# Patient Record
Sex: Female | Born: 1985 | Race: White | Hispanic: No | Marital: Single | Smoking: Former smoker
Health system: Southern US, Community
[De-identification: ages and names within clinical notes are randomized; demographics above are authoritative.]

## PROBLEM LIST (undated history)

## (undated) DIAGNOSIS — H9190 Unspecified hearing loss, unspecified ear: Secondary | ICD-10-CM

## (undated) DIAGNOSIS — I10 Essential (primary) hypertension: Secondary | ICD-10-CM

## (undated) DIAGNOSIS — K760 Fatty (change of) liver, not elsewhere classified: Secondary | ICD-10-CM

## (undated) DIAGNOSIS — K219 Gastro-esophageal reflux disease without esophagitis: Secondary | ICD-10-CM

## (undated) DIAGNOSIS — R519 Headache, unspecified: Secondary | ICD-10-CM

## (undated) DIAGNOSIS — R202 Paresthesia of skin: Secondary | ICD-10-CM

## (undated) DIAGNOSIS — IMO0002 Reserved for concepts with insufficient information to code with codable children: Secondary | ICD-10-CM

## (undated) DIAGNOSIS — E538 Deficiency of other specified B group vitamins: Secondary | ICD-10-CM

## (undated) DIAGNOSIS — G510 Bell's palsy: Secondary | ICD-10-CM

## (undated) DIAGNOSIS — R079 Chest pain, unspecified: Secondary | ICD-10-CM

## (undated) DIAGNOSIS — E0590 Thyrotoxicosis, unspecified without thyrotoxic crisis or storm: Secondary | ICD-10-CM

## (undated) DIAGNOSIS — F602 Antisocial personality disorder: Secondary | ICD-10-CM

## (undated) DIAGNOSIS — F909 Attention-deficit hyperactivity disorder, unspecified type: Secondary | ICD-10-CM

## (undated) DIAGNOSIS — E669 Obesity, unspecified: Secondary | ICD-10-CM

## (undated) DIAGNOSIS — R5383 Other fatigue: Secondary | ICD-10-CM

## (undated) DIAGNOSIS — G43709 Chronic migraine without aura, not intractable, without status migrainosus: Secondary | ICD-10-CM

## (undated) DIAGNOSIS — R739 Hyperglycemia, unspecified: Secondary | ICD-10-CM

## (undated) DIAGNOSIS — F329 Major depressive disorder, single episode, unspecified: Secondary | ICD-10-CM

## (undated) DIAGNOSIS — M797 Fibromyalgia: Secondary | ICD-10-CM

## (undated) DIAGNOSIS — J45909 Unspecified asthma, uncomplicated: Secondary | ICD-10-CM

## (undated) DIAGNOSIS — E781 Pure hyperglyceridemia: Secondary | ICD-10-CM

## (undated) DIAGNOSIS — R51 Headache: Secondary | ICD-10-CM

## (undated) DIAGNOSIS — F32A Depression, unspecified: Secondary | ICD-10-CM

## (undated) HISTORY — DX: Unspecified hearing loss, unspecified ear: H91.90

## (undated) HISTORY — DX: Obesity, unspecified: E66.9

## (undated) HISTORY — PX: COCHLEAR IMPLANT: SUR684

## (undated) HISTORY — DX: Depression, unspecified: F32.A

## (undated) HISTORY — PX: MYRINGOTOMY WITH TUBE PLACEMENT: SHX5663

## (undated) HISTORY — DX: Reserved for concepts with insufficient information to code with codable children: IMO0002

## (undated) HISTORY — DX: Fatty (change of) liver, not elsewhere classified: K76.0

## (undated) HISTORY — DX: Unspecified asthma, uncomplicated: J45.909

## (undated) HISTORY — DX: Other fatigue: R53.83

## (undated) HISTORY — DX: Bell's palsy: G51.0

## (undated) HISTORY — DX: Deficiency of other specified B group vitamins: E53.8

## (undated) HISTORY — DX: Attention-deficit hyperactivity disorder, unspecified type: F90.9

## (undated) HISTORY — DX: Headache, unspecified: R51.9

## (undated) HISTORY — DX: Chest pain, unspecified: R07.9

## (undated) HISTORY — DX: Fibromyalgia: M79.7

## (undated) HISTORY — DX: Hyperglycemia, unspecified: R73.9

## (undated) HISTORY — DX: Major depressive disorder, single episode, unspecified: F32.9

## (undated) HISTORY — DX: Headache: R51

## (undated) HISTORY — PX: LAPAROSCOPY: SHX197

## (undated) HISTORY — DX: Pure hyperglyceridemia: E78.1

## (undated) HISTORY — DX: Paresthesia of skin: R20.2

## (undated) HISTORY — DX: Gastro-esophageal reflux disease without esophagitis: K21.9

## (undated) HISTORY — PX: OTHER SURGICAL HISTORY: SHX169

## (undated) HISTORY — PX: COCHLEAR IMPLANT REMOVAL: SHX1365

## (undated) HISTORY — DX: Chronic migraine without aura, not intractable, without status migrainosus: G43.709

---

## 2008-09-16 DIAGNOSIS — N750 Cyst of Bartholin's gland: Secondary | ICD-10-CM | POA: Insufficient documentation

## 2011-12-06 DIAGNOSIS — N949 Unspecified condition associated with female genital organs and menstrual cycle: Secondary | ICD-10-CM | POA: Diagnosis not present

## 2011-12-06 DIAGNOSIS — Z309 Encounter for contraceptive management, unspecified: Secondary | ICD-10-CM | POA: Diagnosis not present

## 2011-12-23 DIAGNOSIS — N949 Unspecified condition associated with female genital organs and menstrual cycle: Secondary | ICD-10-CM | POA: Diagnosis not present

## 2012-02-10 DIAGNOSIS — Z01419 Encounter for gynecological examination (general) (routine) without abnormal findings: Secondary | ICD-10-CM | POA: Diagnosis not present

## 2012-02-10 DIAGNOSIS — R5381 Other malaise: Secondary | ICD-10-CM | POA: Diagnosis not present

## 2012-02-10 DIAGNOSIS — R1084 Generalized abdominal pain: Secondary | ICD-10-CM | POA: Diagnosis not present

## 2012-02-10 DIAGNOSIS — N3090 Cystitis, unspecified without hematuria: Secondary | ICD-10-CM | POA: Diagnosis not present

## 2012-03-19 DIAGNOSIS — IMO0001 Reserved for inherently not codable concepts without codable children: Secondary | ICD-10-CM | POA: Diagnosis not present

## 2012-04-30 DIAGNOSIS — N946 Dysmenorrhea, unspecified: Secondary | ICD-10-CM | POA: Diagnosis not present

## 2012-04-30 DIAGNOSIS — N949 Unspecified condition associated with female genital organs and menstrual cycle: Secondary | ICD-10-CM | POA: Diagnosis not present

## 2012-05-11 DIAGNOSIS — L251 Unspecified contact dermatitis due to drugs in contact with skin: Secondary | ICD-10-CM | POA: Diagnosis not present

## 2012-07-09 DIAGNOSIS — H905 Unspecified sensorineural hearing loss: Secondary | ICD-10-CM | POA: Diagnosis not present

## 2012-07-09 DIAGNOSIS — H913 Deaf nonspeaking, not elsewhere classified: Secondary | ICD-10-CM | POA: Diagnosis not present

## 2012-07-09 DIAGNOSIS — IMO0001 Reserved for inherently not codable concepts without codable children: Secondary | ICD-10-CM | POA: Diagnosis not present

## 2012-07-11 DIAGNOSIS — H903 Sensorineural hearing loss, bilateral: Secondary | ICD-10-CM | POA: Diagnosis not present

## 2012-07-13 DIAGNOSIS — R109 Unspecified abdominal pain: Secondary | ICD-10-CM | POA: Diagnosis not present

## 2012-07-13 DIAGNOSIS — N898 Other specified noninflammatory disorders of vagina: Secondary | ICD-10-CM | POA: Diagnosis not present

## 2012-07-13 DIAGNOSIS — N3010 Interstitial cystitis (chronic) without hematuria: Secondary | ICD-10-CM | POA: Diagnosis not present

## 2012-07-13 DIAGNOSIS — R319 Hematuria, unspecified: Secondary | ICD-10-CM | POA: Diagnosis not present

## 2012-07-19 DIAGNOSIS — N3010 Interstitial cystitis (chronic) without hematuria: Secondary | ICD-10-CM | POA: Diagnosis not present

## 2012-07-19 DIAGNOSIS — N898 Other specified noninflammatory disorders of vagina: Secondary | ICD-10-CM | POA: Diagnosis not present

## 2012-07-19 DIAGNOSIS — R319 Hematuria, unspecified: Secondary | ICD-10-CM | POA: Diagnosis not present

## 2012-07-27 DIAGNOSIS — R635 Abnormal weight gain: Secondary | ICD-10-CM | POA: Diagnosis not present

## 2012-07-27 DIAGNOSIS — Z309 Encounter for contraceptive management, unspecified: Secondary | ICD-10-CM | POA: Diagnosis not present

## 2012-08-27 DIAGNOSIS — R319 Hematuria, unspecified: Secondary | ICD-10-CM | POA: Diagnosis not present

## 2012-08-27 DIAGNOSIS — R109 Unspecified abdominal pain: Secondary | ICD-10-CM | POA: Diagnosis not present

## 2012-08-27 DIAGNOSIS — N3010 Interstitial cystitis (chronic) without hematuria: Secondary | ICD-10-CM | POA: Diagnosis not present

## 2012-08-28 DIAGNOSIS — R319 Hematuria, unspecified: Secondary | ICD-10-CM | POA: Diagnosis not present

## 2012-08-28 DIAGNOSIS — N3010 Interstitial cystitis (chronic) without hematuria: Secondary | ICD-10-CM | POA: Diagnosis not present

## 2012-10-08 DIAGNOSIS — N3010 Interstitial cystitis (chronic) without hematuria: Secondary | ICD-10-CM | POA: Diagnosis not present

## 2012-10-08 DIAGNOSIS — R319 Hematuria, unspecified: Secondary | ICD-10-CM | POA: Diagnosis not present

## 2012-10-08 DIAGNOSIS — R109 Unspecified abdominal pain: Secondary | ICD-10-CM | POA: Diagnosis not present

## 2012-10-18 DIAGNOSIS — Z3009 Encounter for other general counseling and advice on contraception: Secondary | ICD-10-CM | POA: Diagnosis not present

## 2012-11-26 DIAGNOSIS — IMO0002 Reserved for concepts with insufficient information to code with codable children: Secondary | ICD-10-CM | POA: Diagnosis not present

## 2012-12-17 DIAGNOSIS — IMO0002 Reserved for concepts with insufficient information to code with codable children: Secondary | ICD-10-CM | POA: Diagnosis not present

## 2012-12-31 DIAGNOSIS — R5383 Other fatigue: Secondary | ICD-10-CM | POA: Diagnosis not present

## 2012-12-31 DIAGNOSIS — G4700 Insomnia, unspecified: Secondary | ICD-10-CM | POA: Diagnosis not present

## 2012-12-31 DIAGNOSIS — J45902 Unspecified asthma with status asthmaticus: Secondary | ICD-10-CM | POA: Diagnosis not present

## 2012-12-31 DIAGNOSIS — R5381 Other malaise: Secondary | ICD-10-CM | POA: Diagnosis not present

## 2012-12-31 DIAGNOSIS — M25519 Pain in unspecified shoulder: Secondary | ICD-10-CM | POA: Diagnosis not present

## 2013-01-11 DIAGNOSIS — IMO0001 Reserved for inherently not codable concepts without codable children: Secondary | ICD-10-CM | POA: Diagnosis not present

## 2013-01-11 DIAGNOSIS — M25519 Pain in unspecified shoulder: Secondary | ICD-10-CM | POA: Diagnosis not present

## 2013-01-16 DIAGNOSIS — M25519 Pain in unspecified shoulder: Secondary | ICD-10-CM | POA: Diagnosis not present

## 2013-01-16 DIAGNOSIS — IMO0001 Reserved for inherently not codable concepts without codable children: Secondary | ICD-10-CM | POA: Diagnosis not present

## 2013-01-18 DIAGNOSIS — Z3040 Encounter for surveillance of contraceptives, unspecified: Secondary | ICD-10-CM | POA: Diagnosis not present

## 2013-01-18 DIAGNOSIS — M25519 Pain in unspecified shoulder: Secondary | ICD-10-CM | POA: Diagnosis not present

## 2013-01-25 DIAGNOSIS — M25519 Pain in unspecified shoulder: Secondary | ICD-10-CM | POA: Diagnosis not present

## 2013-01-25 DIAGNOSIS — IMO0001 Reserved for inherently not codable concepts without codable children: Secondary | ICD-10-CM | POA: Diagnosis not present

## 2013-01-28 DIAGNOSIS — J111 Influenza due to unidentified influenza virus with other respiratory manifestations: Secondary | ICD-10-CM | POA: Diagnosis not present

## 2013-01-31 DIAGNOSIS — J209 Acute bronchitis, unspecified: Secondary | ICD-10-CM | POA: Diagnosis not present

## 2013-02-12 DIAGNOSIS — J209 Acute bronchitis, unspecified: Secondary | ICD-10-CM | POA: Diagnosis not present

## 2013-03-14 DIAGNOSIS — J209 Acute bronchitis, unspecified: Secondary | ICD-10-CM | POA: Diagnosis not present

## 2013-03-21 DIAGNOSIS — R05 Cough: Secondary | ICD-10-CM | POA: Diagnosis not present

## 2013-03-21 DIAGNOSIS — H9190 Unspecified hearing loss, unspecified ear: Secondary | ICD-10-CM | POA: Diagnosis not present

## 2013-03-21 DIAGNOSIS — J45909 Unspecified asthma, uncomplicated: Secondary | ICD-10-CM | POA: Diagnosis not present

## 2013-03-21 DIAGNOSIS — Z886 Allergy status to analgesic agent status: Secondary | ICD-10-CM | POA: Diagnosis not present

## 2013-03-21 DIAGNOSIS — R110 Nausea: Secondary | ICD-10-CM | POA: Diagnosis not present

## 2013-03-21 DIAGNOSIS — R071 Chest pain on breathing: Secondary | ICD-10-CM | POA: Diagnosis not present

## 2013-03-21 DIAGNOSIS — R0602 Shortness of breath: Secondary | ICD-10-CM | POA: Diagnosis not present

## 2013-03-21 DIAGNOSIS — R0989 Other specified symptoms and signs involving the circulatory and respiratory systems: Secondary | ICD-10-CM | POA: Diagnosis not present

## 2013-03-21 DIAGNOSIS — R0609 Other forms of dyspnea: Secondary | ICD-10-CM | POA: Diagnosis not present

## 2013-03-21 DIAGNOSIS — R059 Cough, unspecified: Secondary | ICD-10-CM | POA: Diagnosis not present

## 2013-03-21 DIAGNOSIS — Z87891 Personal history of nicotine dependence: Secondary | ICD-10-CM | POA: Diagnosis not present

## 2013-03-21 DIAGNOSIS — IMO0001 Reserved for inherently not codable concepts without codable children: Secondary | ICD-10-CM | POA: Diagnosis not present

## 2013-03-21 DIAGNOSIS — R079 Chest pain, unspecified: Secondary | ICD-10-CM | POA: Diagnosis not present

## 2013-03-21 DIAGNOSIS — Z882 Allergy status to sulfonamides status: Secondary | ICD-10-CM | POA: Diagnosis not present

## 2013-04-08 DIAGNOSIS — IMO0001 Reserved for inherently not codable concepts without codable children: Secondary | ICD-10-CM | POA: Diagnosis not present

## 2013-04-08 DIAGNOSIS — R635 Abnormal weight gain: Secondary | ICD-10-CM | POA: Diagnosis not present

## 2013-04-08 DIAGNOSIS — H9190 Unspecified hearing loss, unspecified ear: Secondary | ICD-10-CM | POA: Diagnosis not present

## 2013-04-08 DIAGNOSIS — M2550 Pain in unspecified joint: Secondary | ICD-10-CM | POA: Diagnosis not present

## 2013-04-08 DIAGNOSIS — J45902 Unspecified asthma with status asthmaticus: Secondary | ICD-10-CM | POA: Diagnosis not present

## 2013-04-08 DIAGNOSIS — Z3040 Encounter for surveillance of contraceptives, unspecified: Secondary | ICD-10-CM | POA: Diagnosis not present

## 2013-04-12 DIAGNOSIS — Z882 Allergy status to sulfonamides status: Secondary | ICD-10-CM | POA: Diagnosis not present

## 2013-04-12 DIAGNOSIS — Z87891 Personal history of nicotine dependence: Secondary | ICD-10-CM | POA: Diagnosis not present

## 2013-04-12 DIAGNOSIS — Z888 Allergy status to other drugs, medicaments and biological substances status: Secondary | ICD-10-CM | POA: Diagnosis not present

## 2013-04-12 DIAGNOSIS — J45909 Unspecified asthma, uncomplicated: Secondary | ICD-10-CM | POA: Diagnosis not present

## 2013-04-12 DIAGNOSIS — H9190 Unspecified hearing loss, unspecified ear: Secondary | ICD-10-CM | POA: Diagnosis not present

## 2013-04-12 DIAGNOSIS — IMO0001 Reserved for inherently not codable concepts without codable children: Secondary | ICD-10-CM | POA: Diagnosis not present

## 2013-04-12 DIAGNOSIS — T380X5A Adverse effect of glucocorticoids and synthetic analogues, initial encounter: Secondary | ICD-10-CM | POA: Diagnosis not present

## 2013-05-13 DIAGNOSIS — J45902 Unspecified asthma with status asthmaticus: Secondary | ICD-10-CM | POA: Diagnosis not present

## 2013-05-13 DIAGNOSIS — Z3040 Encounter for surveillance of contraceptives, unspecified: Secondary | ICD-10-CM | POA: Diagnosis not present

## 2013-05-13 DIAGNOSIS — IMO0001 Reserved for inherently not codable concepts without codable children: Secondary | ICD-10-CM | POA: Diagnosis not present

## 2013-05-22 DIAGNOSIS — H903 Sensorineural hearing loss, bilateral: Secondary | ICD-10-CM | POA: Diagnosis not present

## 2013-06-03 DIAGNOSIS — M2550 Pain in unspecified joint: Secondary | ICD-10-CM | POA: Diagnosis not present

## 2013-06-03 DIAGNOSIS — H9190 Unspecified hearing loss, unspecified ear: Secondary | ICD-10-CM | POA: Diagnosis not present

## 2013-06-05 DIAGNOSIS — H903 Sensorineural hearing loss, bilateral: Secondary | ICD-10-CM | POA: Diagnosis not present

## 2013-07-01 DIAGNOSIS — H905 Unspecified sensorineural hearing loss: Secondary | ICD-10-CM | POA: Insufficient documentation

## 2013-07-01 DIAGNOSIS — E669 Obesity, unspecified: Secondary | ICD-10-CM | POA: Insufficient documentation

## 2013-07-01 DIAGNOSIS — J45909 Unspecified asthma, uncomplicated: Secondary | ICD-10-CM | POA: Diagnosis not present

## 2013-07-01 DIAGNOSIS — R0609 Other forms of dyspnea: Secondary | ICD-10-CM | POA: Diagnosis not present

## 2013-07-01 DIAGNOSIS — J309 Allergic rhinitis, unspecified: Secondary | ICD-10-CM | POA: Diagnosis not present

## 2013-07-01 LAB — PULMONARY FUNCTION TEST

## 2013-07-08 DIAGNOSIS — H918X9 Other specified hearing loss, unspecified ear: Secondary | ICD-10-CM | POA: Diagnosis not present

## 2013-07-08 DIAGNOSIS — T85890A Other specified complication of nervous system prosthetic devices, implants and grafts, initial encounter: Secondary | ICD-10-CM | POA: Diagnosis not present

## 2013-08-05 DIAGNOSIS — IMO0001 Reserved for inherently not codable concepts without codable children: Secondary | ICD-10-CM | POA: Diagnosis not present

## 2013-08-05 DIAGNOSIS — H9190 Unspecified hearing loss, unspecified ear: Secondary | ICD-10-CM | POA: Diagnosis not present

## 2013-08-05 DIAGNOSIS — Z9889 Other specified postprocedural states: Secondary | ICD-10-CM | POA: Diagnosis not present

## 2013-08-05 DIAGNOSIS — R5383 Other fatigue: Secondary | ICD-10-CM | POA: Diagnosis not present

## 2013-08-05 DIAGNOSIS — R42 Dizziness and giddiness: Secondary | ICD-10-CM | POA: Diagnosis not present

## 2013-08-05 DIAGNOSIS — R5381 Other malaise: Secondary | ICD-10-CM | POA: Diagnosis not present

## 2013-08-05 DIAGNOSIS — G43909 Migraine, unspecified, not intractable, without status migrainosus: Secondary | ICD-10-CM | POA: Insufficient documentation

## 2013-08-05 DIAGNOSIS — K219 Gastro-esophageal reflux disease without esophagitis: Secondary | ICD-10-CM | POA: Diagnosis not present

## 2013-08-05 DIAGNOSIS — R110 Nausea: Secondary | ICD-10-CM | POA: Diagnosis not present

## 2013-08-19 DIAGNOSIS — R42 Dizziness and giddiness: Secondary | ICD-10-CM | POA: Diagnosis not present

## 2013-08-19 DIAGNOSIS — H539 Unspecified visual disturbance: Secondary | ICD-10-CM | POA: Diagnosis not present

## 2013-08-19 DIAGNOSIS — Z9889 Other specified postprocedural states: Secondary | ICD-10-CM | POA: Diagnosis not present

## 2013-08-19 DIAGNOSIS — R269 Unspecified abnormalities of gait and mobility: Secondary | ICD-10-CM | POA: Diagnosis not present

## 2013-08-19 DIAGNOSIS — R5381 Other malaise: Secondary | ICD-10-CM | POA: Diagnosis not present

## 2013-08-19 DIAGNOSIS — R209 Unspecified disturbances of skin sensation: Secondary | ICD-10-CM | POA: Diagnosis not present

## 2013-09-03 DIAGNOSIS — M2550 Pain in unspecified joint: Secondary | ICD-10-CM | POA: Diagnosis not present

## 2013-09-03 DIAGNOSIS — G5600 Carpal tunnel syndrome, unspecified upper limb: Secondary | ICD-10-CM | POA: Diagnosis not present

## 2013-09-03 DIAGNOSIS — H9190 Unspecified hearing loss, unspecified ear: Secondary | ICD-10-CM | POA: Diagnosis not present

## 2013-09-09 DIAGNOSIS — R209 Unspecified disturbances of skin sensation: Secondary | ICD-10-CM | POA: Diagnosis not present

## 2013-09-09 DIAGNOSIS — R5381 Other malaise: Secondary | ICD-10-CM | POA: Diagnosis not present

## 2013-09-09 DIAGNOSIS — H539 Unspecified visual disturbance: Secondary | ICD-10-CM | POA: Diagnosis not present

## 2013-09-09 DIAGNOSIS — R42 Dizziness and giddiness: Secondary | ICD-10-CM | POA: Diagnosis not present

## 2013-09-09 DIAGNOSIS — Z9889 Other specified postprocedural states: Secondary | ICD-10-CM | POA: Diagnosis not present

## 2013-10-07 DIAGNOSIS — H905 Unspecified sensorineural hearing loss: Secondary | ICD-10-CM | POA: Diagnosis not present

## 2013-10-07 DIAGNOSIS — R51 Headache: Secondary | ICD-10-CM | POA: Diagnosis not present

## 2013-10-07 DIAGNOSIS — G43109 Migraine with aura, not intractable, without status migrainosus: Secondary | ICD-10-CM | POA: Diagnosis not present

## 2013-10-22 DIAGNOSIS — R209 Unspecified disturbances of skin sensation: Secondary | ICD-10-CM | POA: Diagnosis not present

## 2013-10-22 DIAGNOSIS — R42 Dizziness and giddiness: Secondary | ICD-10-CM | POA: Diagnosis not present

## 2013-12-06 DIAGNOSIS — R42 Dizziness and giddiness: Secondary | ICD-10-CM | POA: Diagnosis not present

## 2013-12-06 DIAGNOSIS — H9190 Unspecified hearing loss, unspecified ear: Secondary | ICD-10-CM | POA: Diagnosis not present

## 2013-12-06 DIAGNOSIS — Z9889 Other specified postprocedural states: Secondary | ICD-10-CM | POA: Diagnosis not present

## 2013-12-06 DIAGNOSIS — Z3009 Encounter for other general counseling and advice on contraception: Secondary | ICD-10-CM | POA: Diagnosis not present

## 2013-12-06 DIAGNOSIS — IMO0001 Reserved for inherently not codable concepts without codable children: Secondary | ICD-10-CM | POA: Diagnosis not present

## 2013-12-10 DIAGNOSIS — J209 Acute bronchitis, unspecified: Secondary | ICD-10-CM | POA: Diagnosis not present

## 2014-01-02 DIAGNOSIS — R1310 Dysphagia, unspecified: Secondary | ICD-10-CM | POA: Diagnosis not present

## 2014-01-02 DIAGNOSIS — J45901 Unspecified asthma with (acute) exacerbation: Secondary | ICD-10-CM | POA: Diagnosis not present

## 2014-01-02 DIAGNOSIS — R1110 Vomiting, unspecified: Secondary | ICD-10-CM | POA: Diagnosis not present

## 2014-01-24 DIAGNOSIS — IMO0001 Reserved for inherently not codable concepts without codable children: Secondary | ICD-10-CM | POA: Diagnosis not present

## 2014-01-30 DIAGNOSIS — N390 Urinary tract infection, site not specified: Secondary | ICD-10-CM | POA: Diagnosis not present

## 2014-02-18 DIAGNOSIS — R112 Nausea with vomiting, unspecified: Secondary | ICD-10-CM | POA: Diagnosis not present

## 2014-02-27 DIAGNOSIS — H913 Deaf nonspeaking, not elsewhere classified: Secondary | ICD-10-CM | POA: Diagnosis not present

## 2014-02-27 DIAGNOSIS — K2970 Gastritis, unspecified, without bleeding: Secondary | ICD-10-CM | POA: Diagnosis not present

## 2014-02-27 DIAGNOSIS — IMO0001 Reserved for inherently not codable concepts without codable children: Secondary | ICD-10-CM | POA: Diagnosis not present

## 2014-02-27 DIAGNOSIS — K2990 Gastroduodenitis, unspecified, without bleeding: Secondary | ICD-10-CM | POA: Diagnosis not present

## 2014-02-27 DIAGNOSIS — E669 Obesity, unspecified: Secondary | ICD-10-CM | POA: Diagnosis not present

## 2014-02-27 DIAGNOSIS — Z79899 Other long term (current) drug therapy: Secondary | ICD-10-CM | POA: Diagnosis not present

## 2014-02-27 DIAGNOSIS — J45909 Unspecified asthma, uncomplicated: Secondary | ICD-10-CM | POA: Diagnosis not present

## 2014-02-27 DIAGNOSIS — IMO0002 Reserved for concepts with insufficient information to code with codable children: Secondary | ICD-10-CM | POA: Diagnosis not present

## 2014-02-27 DIAGNOSIS — R112 Nausea with vomiting, unspecified: Secondary | ICD-10-CM | POA: Diagnosis not present

## 2014-02-27 DIAGNOSIS — K2940 Chronic atrophic gastritis without bleeding: Secondary | ICD-10-CM | POA: Diagnosis not present

## 2014-02-27 DIAGNOSIS — Z6832 Body mass index (BMI) 32.0-32.9, adult: Secondary | ICD-10-CM | POA: Diagnosis not present

## 2014-02-27 DIAGNOSIS — Z7721 Contact with and (suspected) exposure to potentially hazardous body fluids: Secondary | ICD-10-CM | POA: Diagnosis not present

## 2014-02-27 DIAGNOSIS — K209 Esophagitis, unspecified without bleeding: Secondary | ICD-10-CM | POA: Diagnosis not present

## 2014-03-10 DIAGNOSIS — H918X9 Other specified hearing loss, unspecified ear: Secondary | ICD-10-CM | POA: Diagnosis not present

## 2014-03-10 DIAGNOSIS — IMO0001 Reserved for inherently not codable concepts without codable children: Secondary | ICD-10-CM | POA: Diagnosis not present

## 2014-03-21 DIAGNOSIS — IMO0001 Reserved for inherently not codable concepts without codable children: Secondary | ICD-10-CM | POA: Diagnosis not present

## 2014-03-28 LAB — HM MAMMOGRAPHY: HM Mammogram: NORMAL

## 2014-04-01 DIAGNOSIS — Z124 Encounter for screening for malignant neoplasm of cervix: Secondary | ICD-10-CM | POA: Diagnosis not present

## 2014-04-01 DIAGNOSIS — Z1239 Encounter for other screening for malignant neoplasm of breast: Secondary | ICD-10-CM | POA: Diagnosis not present

## 2014-04-01 DIAGNOSIS — Z3009 Encounter for other general counseling and advice on contraception: Secondary | ICD-10-CM | POA: Diagnosis not present

## 2014-04-01 DIAGNOSIS — Z01419 Encounter for gynecological examination (general) (routine) without abnormal findings: Secondary | ICD-10-CM | POA: Diagnosis not present

## 2014-04-02 LAB — HM PAP SMEAR: HM Pap smear: NORMAL

## 2014-04-24 DIAGNOSIS — Z6832 Body mass index (BMI) 32.0-32.9, adult: Secondary | ICD-10-CM | POA: Diagnosis not present

## 2014-04-24 DIAGNOSIS — Z882 Allergy status to sulfonamides status: Secondary | ICD-10-CM | POA: Diagnosis not present

## 2014-04-24 DIAGNOSIS — Z87891 Personal history of nicotine dependence: Secondary | ICD-10-CM | POA: Diagnosis not present

## 2014-04-24 DIAGNOSIS — T85898A Other specified complication of other internal prosthetic devices, implants and grafts, initial encounter: Secondary | ICD-10-CM | POA: Diagnosis not present

## 2014-04-24 DIAGNOSIS — E669 Obesity, unspecified: Secondary | ICD-10-CM | POA: Diagnosis not present

## 2014-04-24 DIAGNOSIS — H9209 Otalgia, unspecified ear: Secondary | ICD-10-CM | POA: Diagnosis not present

## 2014-04-24 DIAGNOSIS — J45909 Unspecified asthma, uncomplicated: Secondary | ICD-10-CM | POA: Diagnosis not present

## 2014-04-24 DIAGNOSIS — Z886 Allergy status to analgesic agent status: Secondary | ICD-10-CM | POA: Diagnosis not present

## 2014-04-24 DIAGNOSIS — H903 Sensorineural hearing loss, bilateral: Secondary | ICD-10-CM | POA: Diagnosis not present

## 2014-04-24 DIAGNOSIS — IMO0001 Reserved for inherently not codable concepts without codable children: Secondary | ICD-10-CM | POA: Diagnosis not present

## 2014-04-24 DIAGNOSIS — Q179 Congenital malformation of ear, unspecified: Secondary | ICD-10-CM | POA: Diagnosis not present

## 2014-04-24 DIAGNOSIS — Z888 Allergy status to other drugs, medicaments and biological substances status: Secondary | ICD-10-CM | POA: Diagnosis not present

## 2014-06-30 DIAGNOSIS — R300 Dysuria: Secondary | ICD-10-CM | POA: Diagnosis not present

## 2014-06-30 DIAGNOSIS — N390 Urinary tract infection, site not specified: Secondary | ICD-10-CM | POA: Diagnosis not present

## 2014-08-13 ENCOUNTER — Emergency Department: Payer: Self-pay | Admitting: Emergency Medicine

## 2014-08-13 DIAGNOSIS — R079 Chest pain, unspecified: Secondary | ICD-10-CM | POA: Diagnosis not present

## 2014-08-13 DIAGNOSIS — R0789 Other chest pain: Secondary | ICD-10-CM | POA: Diagnosis not present

## 2014-08-13 DIAGNOSIS — J45909 Unspecified asthma, uncomplicated: Secondary | ICD-10-CM | POA: Diagnosis not present

## 2014-08-13 DIAGNOSIS — Z87891 Personal history of nicotine dependence: Secondary | ICD-10-CM | POA: Diagnosis not present

## 2014-08-13 DIAGNOSIS — Z79899 Other long term (current) drug therapy: Secondary | ICD-10-CM | POA: Diagnosis not present

## 2014-08-13 DIAGNOSIS — J45901 Unspecified asthma with (acute) exacerbation: Secondary | ICD-10-CM | POA: Diagnosis not present

## 2014-08-13 DIAGNOSIS — R0602 Shortness of breath: Secondary | ICD-10-CM | POA: Diagnosis not present

## 2014-08-13 LAB — BASIC METABOLIC PANEL
Anion Gap: 10 (ref 7–16)
BUN: 10 mg/dL (ref 7–18)
CO2: 23 mmol/L (ref 21–32)
Calcium, Total: 9.3 mg/dL (ref 8.5–10.1)
Chloride: 104 mmol/L (ref 98–107)
Creatinine: 0.89 mg/dL (ref 0.60–1.30)
EGFR (African American): >60
EGFR (Non-African Amer.): >60
Glucose: 74 mg/dL (ref 65–99)
OSMOLALITY: 272 (ref 275–301)
Potassium: 3.8 mmol/L (ref 3.5–5.1)
SODIUM: 137 mmol/L (ref 136–145)

## 2014-08-13 LAB — CBC
HCT: 44.9 % (ref 35.0–47.0)
HGB: 14.4 g/dL (ref 12.0–16.0)
MCH: 27.7 pg (ref 26.0–34.0)
MCHC: 32.1 g/dL (ref 32.0–36.0)
MCV: 86 fL (ref 80–100)
Platelet: 382 10*3/uL (ref 150–440)
RBC: 5.21 10*6/uL — AB (ref 3.80–5.20)
RDW: 12.5 % (ref 11.5–14.5)
WBC: 12.5 10*3/uL — ABNORMAL HIGH (ref 3.6–11.0)

## 2014-08-13 LAB — TROPONIN I: Troponin-I: <0.02 ng/mL

## 2014-08-13 LAB — D-DIMER(ARMC): D-DIMER: 128 ng/mL

## 2014-08-21 DIAGNOSIS — G510 Bell's palsy: Secondary | ICD-10-CM | POA: Diagnosis not present

## 2014-08-26 DIAGNOSIS — H9190 Unspecified hearing loss, unspecified ear: Secondary | ICD-10-CM | POA: Diagnosis not present

## 2014-08-26 DIAGNOSIS — G510 Bell's palsy: Secondary | ICD-10-CM | POA: Diagnosis not present

## 2014-09-01 DIAGNOSIS — K219 Gastro-esophageal reflux disease without esophagitis: Secondary | ICD-10-CM | POA: Diagnosis not present

## 2014-09-01 DIAGNOSIS — E669 Obesity, unspecified: Secondary | ICD-10-CM | POA: Diagnosis not present

## 2014-09-01 DIAGNOSIS — M797 Fibromyalgia: Secondary | ICD-10-CM | POA: Diagnosis not present

## 2014-09-01 DIAGNOSIS — J45998 Other asthma: Secondary | ICD-10-CM | POA: Diagnosis not present

## 2014-09-01 DIAGNOSIS — R42 Dizziness and giddiness: Secondary | ICD-10-CM | POA: Diagnosis not present

## 2014-09-01 DIAGNOSIS — Z23 Encounter for immunization: Secondary | ICD-10-CM | POA: Diagnosis not present

## 2014-09-01 DIAGNOSIS — Z713 Dietary counseling and surveillance: Secondary | ICD-10-CM | POA: Diagnosis not present

## 2014-09-01 DIAGNOSIS — R202 Paresthesia of skin: Secondary | ICD-10-CM | POA: Diagnosis not present

## 2014-09-02 ENCOUNTER — Ambulatory Visit: Payer: Self-pay | Admitting: Family Medicine

## 2014-09-02 DIAGNOSIS — J45998 Other asthma: Secondary | ICD-10-CM | POA: Diagnosis not present

## 2014-09-03 DIAGNOSIS — R5383 Other fatigue: Secondary | ICD-10-CM | POA: Diagnosis not present

## 2014-09-03 DIAGNOSIS — R202 Paresthesia of skin: Secondary | ICD-10-CM | POA: Diagnosis not present

## 2014-09-03 DIAGNOSIS — J45909 Unspecified asthma, uncomplicated: Secondary | ICD-10-CM | POA: Diagnosis not present

## 2014-09-03 DIAGNOSIS — R42 Dizziness and giddiness: Secondary | ICD-10-CM | POA: Diagnosis not present

## 2014-09-03 DIAGNOSIS — R739 Hyperglycemia, unspecified: Secondary | ICD-10-CM | POA: Diagnosis not present

## 2014-09-03 DIAGNOSIS — R7309 Other abnormal glucose: Secondary | ICD-10-CM | POA: Diagnosis not present

## 2014-09-03 DIAGNOSIS — R209 Unspecified disturbances of skin sensation: Secondary | ICD-10-CM | POA: Diagnosis not present

## 2014-09-03 LAB — HEMOGLOBIN A1C: Hgb A1c MFr Bld: 5.6 % (ref 4.0–6.0)

## 2014-09-08 DIAGNOSIS — H903 Sensorineural hearing loss, bilateral: Secondary | ICD-10-CM | POA: Diagnosis not present

## 2014-09-08 DIAGNOSIS — R42 Dizziness and giddiness: Secondary | ICD-10-CM | POA: Diagnosis not present

## 2014-09-08 DIAGNOSIS — G510 Bell's palsy: Secondary | ICD-10-CM | POA: Diagnosis not present

## 2014-09-09 ENCOUNTER — Ambulatory Visit: Payer: Self-pay | Admitting: Family Medicine

## 2014-09-09 DIAGNOSIS — R209 Unspecified disturbances of skin sensation: Secondary | ICD-10-CM | POA: Diagnosis not present

## 2014-09-09 DIAGNOSIS — R202 Paresthesia of skin: Secondary | ICD-10-CM | POA: Diagnosis not present

## 2014-09-09 DIAGNOSIS — Z3202 Encounter for pregnancy test, result negative: Secondary | ICD-10-CM | POA: Diagnosis not present

## 2014-09-09 LAB — HCG, QUANTITATIVE, PREGNANCY: Beta Hcg, Quant.: <1 m[IU]/mL — ABNORMAL LOW

## 2014-09-09 LAB — PREGNANCY, URINE: PREGNANCY TEST, URINE: NEGATIVE m[IU]/mL

## 2014-09-11 DIAGNOSIS — R42 Dizziness and giddiness: Secondary | ICD-10-CM | POA: Diagnosis not present

## 2014-09-16 DIAGNOSIS — R739 Hyperglycemia, unspecified: Secondary | ICD-10-CM | POA: Diagnosis not present

## 2014-09-16 DIAGNOSIS — R202 Paresthesia of skin: Secondary | ICD-10-CM | POA: Diagnosis not present

## 2014-09-16 DIAGNOSIS — E538 Deficiency of other specified B group vitamins: Secondary | ICD-10-CM | POA: Diagnosis not present

## 2014-09-16 DIAGNOSIS — E663 Overweight: Secondary | ICD-10-CM | POA: Diagnosis not present

## 2014-09-16 DIAGNOSIS — J4540 Moderate persistent asthma, uncomplicated: Secondary | ICD-10-CM | POA: Diagnosis not present

## 2014-09-16 DIAGNOSIS — M797 Fibromyalgia: Secondary | ICD-10-CM | POA: Diagnosis not present

## 2014-09-17 DIAGNOSIS — H903 Sensorineural hearing loss, bilateral: Secondary | ICD-10-CM | POA: Diagnosis not present

## 2014-09-17 DIAGNOSIS — R42 Dizziness and giddiness: Secondary | ICD-10-CM | POA: Diagnosis not present

## 2014-10-06 ENCOUNTER — Encounter: Payer: Self-pay | Admitting: *Deleted

## 2014-10-07 ENCOUNTER — Ambulatory Visit (INDEPENDENT_AMBULATORY_CARE_PROVIDER_SITE_OTHER): Payer: Medicare Other | Admitting: Internal Medicine

## 2014-10-07 ENCOUNTER — Telehealth: Payer: Self-pay | Admitting: *Deleted

## 2014-10-07 ENCOUNTER — Institutional Professional Consult (permissible substitution): Payer: Self-pay | Admitting: Internal Medicine

## 2014-10-07 ENCOUNTER — Encounter: Payer: Self-pay | Admitting: Internal Medicine

## 2014-10-07 VITALS — BP 106/74 | HR 95 | Ht 63.0 in | Wt 168.8 lb

## 2014-10-07 DIAGNOSIS — J45909 Unspecified asthma, uncomplicated: Secondary | ICD-10-CM

## 2014-10-07 DIAGNOSIS — R0600 Dyspnea, unspecified: Secondary | ICD-10-CM | POA: Diagnosis not present

## 2014-10-07 DIAGNOSIS — J4540 Moderate persistent asthma, uncomplicated: Secondary | ICD-10-CM | POA: Insufficient documentation

## 2014-10-07 MED ORDER — ADVAIR DISKUS 500-50 MCG/DOSE IN AEPB: 1.0000 | INHALATION_SPRAY | Freq: Two times a day (BID) | RESPIRATORY_TRACT | Status: DC

## 2014-10-07 NOTE — Assessment & Plan Note (Signed)
Asthma: Severe persistent with complications of worsening dyspnea on exertion.  Plan -Patient with worsening obstruction compared to one year ago, FEV1 now 48 % predicted no significant bronchodilator response, also peak flows consistently in the yellow zone (250-380), patient averaging about 300 mL. -Unclear etiology for sudden drop in FEV1, especially with no significant triggers. Possibility being around farm animals and wooded area could be triggering further hyperreactive or bronchospastic response. -Further evaluation with CBC with differential, IgE level, IgG level. -Significant dyspnea is noted along with obstruction on pulmonary function testing, will further evaluate with CT scan of chest, evaluate for developing bronchiectasis. -Given her level of dyspnea, will also further evaluate with 2-D echo, evaluating for any hypokinesis with possible cardiomyopathy that could be adding to the dyspnea. -the standard therapy for a patient with asthma is ICS, however given her severity of symptoms and persistent use of albuterol, step up therapy with ICS\LABA is warranted at this time (Advair 500/50).  If no improvement at next follow up with she may require a short course of steroids, that can be tapered.  -Will also refer to pulmonary rehabilitation. -Will consider hypersensitivity testing at next visit. 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

## 2014-10-07 NOTE — Assessment & Plan Note (Signed)
See plan for asthma  

## 2014-10-07 NOTE — Patient Instructions (Addendum)
Patient will have labs today  Patient will have CT chest Patient will have referral to [facility] Patient will have Echo. Patient will start Advair 500/50 1 puff twice daily. 1 month follow up.

## 2014-10-07 NOTE — Progress Notes (Signed)
Date: 10/07/2014  MRN# [id number] Yesenia Wood [birth year]  Referring Physician:   SAGE HAMMILL is a 28 y.o. old female seen in consultation for asthma control and optimization of medications  CC: "worsening shortness of breath and asthma over the past 2-3 months" Chief Complaint  Patient presents with  . Advice Only    patient here todaywith asthma.She has sob with exhertion as well as sitting and talking. Patient has tightness in chest at times. She denies wheezing and slight cough.    HPI:  She is a pleasant 28 year old female accompanied by her mother and interpreter, seen in consultation for worsening asthma and shortness of breath, referred by Dr. Carlynn Purl.  History per the patient and review of medical records Patient is a past medical history of deafness, status post cochlear implant with failure, fibromyalgia, B12 deficiency, moderate asthma, obesity. Her asthma history is as follows: As a teenager exercise-induced bronchospasms that was treated with as needed albuterol, and her early 25s started having more shortness of breath and was previously on Qvar. Triggers: Unsure, mild exacerbation with seasonal changes, more shortness of breath with warm weather (this is very mild). Patient cannot identify any inciting factor 2-3 months ago that made her breathing more difficult. She had a spirometry done at her previous pulmonologist in [place], 07/11/2013, FEV1 79, FEV1/FVC 74. Her repeat spirometry at [facility] showed FEV1 48%, FEV1/FVC 66% on 09/02/2014.  There has been no significantrespiratory illness in the past year the patient or her mother can identify a significant drop in her FEV1 and worsening of her symptoms. 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  She previously smoked one to 2 packs per day for 2 years and quit 8 years ago. She denies any E. Cigarettes or water-based tobacco  products. Patient has a dog, Yorkie, that stay her,she states that she is very clean, does not have a lot of carpeting, no roaches or rodent infestations in her home. 2 months ago she was diagnosed with a urinary tract infection, shortly after that chest tightness and difficulty breathing, went to the ER, had a negative d-dimer, will was told she had chest wall discomfort and was given a prednisone taper for shortness of breath. Upon completion of her prednisone taper she develop right-sided facial Bell's palsy. She has also had testing for Lyme, which was negative. On 09/16/2014 her primary care physician changed her controller medication to Advair 250/50, from Qvar, she is also on Ventolin, which she is currently using 3-4 times per day for chest tightness and shortness of breath: She states this does not provide significant relief. She denies fever, chills. 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 She could not recall any tick bites. Patient states that she has a peak flow meter at home and uses it every morning, currently she is averaging about 300 for the past one month, which is the yellow zone for her. Patient denies any cough, runny nose, mucus production.   PMHX:   Past Medical History  Diagnosis Date  . Hyperglycemia   . Obesity   . GERD (gastroesophageal reflux disease)   . Fibromyalgia   . Paresthesia   . Fatigue   . B12 deficiency   . Moderate asthma   . Bell's palsy  right sided  . Headache   . Deaf   . Premature birth    Surgical Hx:  Past Surgical History  Procedure Laterality Date  . Bartholin gland cyst removal    . Laparoscopy    . Myringotomy with tube placement    . Cochlear implant    . Cochlear implant removal     Family Hx:  Family History  Problem Relation Age of Onset  . Adopted: Yes   Social Hx:   History  Substance Use Topics  .  Smoking status: Former Smoker -- 1.00 packs/day for 2 years    Types: Cigarettes  . Smokeless tobacco: Never Used  . Alcohol Use: 0.0 oz/week    0 Not specified per week     Comment: rarely   Medication:   Current Outpatient Rx  Name  Route  Sig  Dispense  Refill  . ADVAIR DISKUS 500-50 MCG/DOSE AEPB   Inhalation   Inhale 1 puff into the lungs 2 (two) times daily.   60 each   1     Dispense as written.   Marland Kitchen albuterol (PROVENTIL HFA;VENTOLIN HFA) 108 (90 BASE) MCG/ACT inhaler   Inhalation   Inhale 2 puffs into the lungs every 4 (four) hours as needed for wheezing or shortness of breath.         . Cyanocobalamin (B-12) 1000 MCG SUBL   Sublingual   Place 1 tablet under the tongue daily.         . cyclobenzaprine (FLEXERIL) 10 MG tablet   Oral   Take 10 mg by mouth daily as needed for muscle spasms.         . drospirenone-ethinyl estradiol (YAZ,GIANVI,LORYNA) 3-0.02 MG tablet   Oral   Take 1 tablet by mouth daily.         . ferrous sulfate 325 (65 FE) MG tablet   Oral   Take 1 tablet by mouth daily.         . Multiple Vitamin (MULTI-VITAMINS) TABS   Oral   Take 1 tablet by mouth daily as needed.         . Turmeric 450 MG CAPS   Oral   Take 1 capsule by mouth daily.             Allergies:  Symbicort; Diclofenac; Duloxetine hcl; Montelukast sodium; Mupirocin; Naproxen; and Sulfa antibiotics  Review of Systems: Gen:  Denies  fever, sweats, chills HEENT: Denies blurred vision, double vision, ear pain, eye pain, hearing loss, nose bleeds, sore throat Cvc:  No dizziness, chest pain or heaviness Resp:  Shortness of breath Gi: Denies swallowing difficulty, stomach pain, nausea or vomiting, diarrhea, constipation, bowel incontinence Gu:  Denies bladder incontinence, burning urine Ext:   No Joint pain, stiffness or swelling Skin: No skin rash, easy bruising or bleeding or hives Endoc:  No polyuria, polydipsia , polyphagia or weight change Psych: No  depression, insomnia or hallucinations  Other:  All other systems negative  Physical Examination:   VS: BP 106/74 mmHg  Pulse 95  Ht 5\' 3"  (1.6 m)  Wt 168 lb 12.8 oz (76.567 kg)  BMI 29.91 kg/m2  SpO2 97%  General Appearance: No distress  Neuro:without focal findings, mental status, speech normal, alert and oriented, cranial nerves 2-12 intact, reflexes normal and symmetric, sensation grossly normal  HEENT: PERRLA, EOM intact, no ptosis, no other lesions noticed; Mallampati 2 Pulmonary: poor effort on deep inspiratory and expiratory maneuvers, decreased breath sounds at the bilateral bases, no diffuse  wheezing, no focal wheezing, no rales, rhonchi, crackles. Sputum production: None  CardiovascularNormal S1,S2.  No m/r/g.  Abdominal aorta pulsation normal.    Abdomen: Benign, Soft, non-tender, No masses, hepatosplenomegaly, No lymphadenopathy Renal:  No costovertebral tenderness  GU:  No performed at this time. Endoc: No evident thyromegaly, no signs of acromegaly or Cushing features Skin:   warm, no rashes, no ecchymosis  Extremities: normal, no cyanosis, clubbing, no edema, warm with normal capillary refill. Other findings:none   Rad results: (The following images and results were reviewed by Dr. Dema SeverinMungal).   CXR 2 View 08/13/14 CHEST 2 VIEW  COMPARISON: None.  FINDINGS: The heart size and mediastinal contours are within normal limits. Both lungs are clear. The visualized skeletal structures are unremarkable.  IMPRESSION: No active cardiopulmonary disease.   PFT Testing 09/04/14:    Spirometry: FVC: 2.17L 58%predicted FEV1: 1.43L 48%predicted FEV1/FVC: 66%predicted.   Volume/Flow Loops: Scooping of expiratory limb.   Comments: Spirometric Data was not Reproducible Fev1/fvc ratio reduced.   Assessment/Impression: Severe Obstructive Lung disease, Clinical Correlation Advised.    Assessment and Plan: Intrinsic asthma Asthma: Severe persistent with complications of  worsening dyspnea on exertion.  Plan -Patient with worsening obstruction compared to one year ago, FEV1 now 48 % predicted no significant bronchodilator response, also peak flows consistently in the yellow zone (250-380), patient averaging about 300 mL. -Unclear etiology for sudden drop in FEV1, especially with no significant triggers. Possibility being around farm animals and wooded area could be triggering further hyperreactive or bronchospastic response. -Further evaluation with CBC with differential, IgE level, IgG level. -Significant dyspnea is noted along with obstruction on pulmonary function testing, will further evaluate with CT scan of chest, evaluate for developing bronchiectasis. -Given her level of dyspnea, will also further evaluate with 2-D echo, evaluating for any hypokinesis with possible cardiomyopathy that could be adding to the dyspnea. -the standard therapy for a patient with asthma is ICS, however given her severity of symptoms and persistent use of albuterol, step up therapy with ICS\LABA is warranted at this time (Advair 500/50).  If no improvement at next follow up with she may require a short course of steroids, that can be tapered.  -Will also refer to pulmonary rehabilitation. -Will consider hypersensitivity testing at next visit. 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   Dyspnea See plan for asthma.     Updated Medication List Outpatient Encounter Prescriptions as of 10/07/2014  Medication Sig  . ADVAIR DISKUS 500-50 MCG/DOSE AEPB Inhale 1 puff into the lungs 2 (two) times daily.  Marland Kitchen. albuterol (PROVENTIL HFA;VENTOLIN HFA) 108 (90 BASE) MCG/ACT inhaler Inhale 2 puffs into the lungs every 4 (four) hours as needed for wheezing or shortness of breath.  . Cyanocobalamin (B-12)  1000 MCG SUBL Place 1 tablet under the tongue daily.  . cyclobenzaprine (FLEXERIL) 10 MG tablet Take 10 mg by mouth daily as needed for muscle spasms.  . drospirenone-ethinyl estradiol (YAZ,GIANVI,LORYNA) 3-0.02 MG tablet Take 1 tablet by mouth daily.  . ferrous sulfate 325 (65 FE) MG tablet Take 1 tablet by mouth daily.  . Multiple Vitamin (MULTI-VITAMINS) TABS Take 1 tablet by mouth daily as needed.  . Turmeric 450 MG CAPS Take 1 capsule by mouth daily.  . [DISCONTINUED] beclomethasone (QVAR) 80 MCG/ACT inhaler Inhale 2 puffs into the lungs 2 (two)  times daily. Four to six  Times as needed  . [DISCONTINUED] Fluticasone-Salmeterol (ADVAIR) 250-50 MCG/DOSE AEPB Inhale 1 puff into the lungs 2 (two) times daily.    Orders for this visit: Orders Placed This Encounter  Procedures  . CT Chest W Contrast    Standing Status: Future     Number of Occurrences:      Standing Expiration Date: 12/08/2015    Scheduling Instructions:     Please set patient up with CT chest with contrast [facility]    Order Specific Question:  Reason for Exam (SYMPTOM  OR DIAGNOSIS REQUIRED)    Answer:  dyspnes    Order Specific Question:  Is the patient pregnant?    Answer:  No    Order Specific Question:  Preferred imaging location?    Answer:  [facility]  . CBC with Differential  . IgE  . IgG, IgA, IgM  . Alpha-1 antitrypsin phenotype  . Ambulatory referral to Pulmonology    Referral Priority:  Routine    Referral Type:  Consultation    Referral Reason:  Specialty Services Required    Requested Specialty:  Pulmonary Disease    Number of Visits Requested:  1  . 2D Echocardiogram with bubble study    Standing Status: Future     Number of Occurrences:      Standing Expiration Date: 10/08/2015    Scheduling Instructions:     [facility] with bubbles. Dr. Mariah MillingGollan or Greggory StallionAriva to ready [facility]. Dx: Dyspnea    Order Specific Question:  Type of Echo    Answer:   Complete    Order Specific Question:  Where should this test be performed    Answer:  Other    Order Specific Question:  Reason for exam-Echo    Answer:  Dyspnea  786.09 / R06.00     Thank  you for the consultation and for allowing [facility] to assist in the care of your patient. Our recommendations are noted above.  Please contact us if we can be of further service.   Stephanie AcreVishal Juliann Olesky, MD  Pulmonary and Critical Care Office Number: [phone]

## 2014-10-07 NOTE — Telephone Encounter (Signed)
Called patient's mother to remind her that Advair 500/50 was sent to the pharmacy use a directed. She did not have time to check out so she will call on tomorrow to schedule 1 month follow up with Dr. Dema SeverinMungal.

## 2014-10-08 LAB — CBC WITH DIFFERENTIAL/PLATELET
BASOS PCT: 0.3 % (ref 0.0–3.0)
Basophils Absolute: 0 10*3/uL (ref 0.0–0.1)
EOS PCT: 0.4 % (ref 0.0–5.0)
Eosinophils Absolute: 0 10*3/uL (ref 0.0–0.7)
HCT: 39.1 % (ref 36.0–46.0)
Hemoglobin: 13.2 g/dL (ref 12.0–15.0)
LYMPHS ABS: 2.5 10*3/uL (ref 0.7–4.0)
LYMPHS PCT: 26.1 % (ref 12.0–46.0)
MCHC: 33.8 g/dL (ref 30.0–36.0)
MCV: 82.7 fl (ref 78.0–100.0)
MONOS PCT: 5 % (ref 3.0–12.0)
Monocytes Absolute: 0.5 10*3/uL (ref 0.1–1.0)
NEUTROS ABS: 6.5 10*3/uL (ref 1.4–7.7)
NEUTROS PCT: 68.2 % (ref 43.0–77.0)
PLATELETS: 365 10*3/uL (ref 150.0–400.0)
RBC: 4.73 Mil/uL (ref 3.87–5.11)
RDW: 13.2 % (ref 11.5–15.5)
WBC: 9.6 10*3/uL (ref 4.0–10.5)

## 2014-10-08 LAB — IGG, IGA, IGM
IgA: 277 mg/dL (ref 69–380)
IgG (Immunoglobin G), Serum: 844 mg/dL (ref 690–1700)
IgM, Serum: 107 mg/dL (ref 52–322)

## 2014-10-08 LAB — IGE: IgE (Immunoglobulin E), Serum: 15 kU/L (ref <115)

## 2014-10-10 ENCOUNTER — Ambulatory Visit: Payer: Self-pay | Admitting: Internal Medicine

## 2014-10-10 DIAGNOSIS — R0600 Dyspnea, unspecified: Secondary | ICD-10-CM

## 2014-10-10 DIAGNOSIS — R0602 Shortness of breath: Secondary | ICD-10-CM | POA: Diagnosis not present

## 2014-10-13 ENCOUNTER — Other Ambulatory Visit: Payer: Self-pay

## 2014-10-13 DIAGNOSIS — R0600 Dyspnea, unspecified: Secondary | ICD-10-CM

## 2014-10-14 ENCOUNTER — Encounter: Payer: Self-pay | Admitting: Internal Medicine

## 2014-10-14 LAB — ALPHA-1 ANTITRYPSIN PHENOTYPE: A-1 Antitrypsin: 213 mg/dL — ABNORMAL HIGH (ref 83–199)

## 2014-10-17 ENCOUNTER — Encounter: Payer: Self-pay | Admitting: Otolaryngology

## 2014-10-17 DIAGNOSIS — R262 Difficulty in walking, not elsewhere classified: Secondary | ICD-10-CM | POA: Diagnosis not present

## 2014-10-17 DIAGNOSIS — R42 Dizziness and giddiness: Secondary | ICD-10-CM | POA: Diagnosis not present

## 2014-10-20 ENCOUNTER — Telehealth: Payer: Self-pay | Admitting: Internal Medicine

## 2014-10-20 DIAGNOSIS — H832X1 Labyrinthine dysfunction, right ear: Secondary | ICD-10-CM | POA: Diagnosis not present

## 2014-10-20 DIAGNOSIS — E538 Deficiency of other specified B group vitamins: Secondary | ICD-10-CM | POA: Diagnosis not present

## 2014-10-20 DIAGNOSIS — J4540 Moderate persistent asthma, uncomplicated: Secondary | ICD-10-CM | POA: Diagnosis not present

## 2014-10-20 DIAGNOSIS — M797 Fibromyalgia: Secondary | ICD-10-CM | POA: Diagnosis not present

## 2014-10-20 NOTE — Telephone Encounter (Signed)
Called and spoke to pt. Pt is requesting result of labs that were drawn on 11/10. Pt aware we will contact her when results have been reviewed.   Dr. Dema SeverinMungal please advise.

## 2014-10-20 NOTE — Telephone Encounter (Signed)
Called and spoke to pt. Informed pt of the results and recs per VM. Pt verbalized understanding and denied any further questions or concerns at this time.

## 2014-10-20 NOTE — Telephone Encounter (Signed)
Currently labs (IgG, IgM) are within normal limits. ECHO with no abnormalities, normal appearing ECHO for a 28yo Female.

## 2014-10-28 ENCOUNTER — Encounter: Payer: Self-pay | Admitting: Otolaryngology

## 2014-10-28 DIAGNOSIS — R42 Dizziness and giddiness: Secondary | ICD-10-CM | POA: Diagnosis not present

## 2014-10-28 DIAGNOSIS — R262 Difficulty in walking, not elsewhere classified: Secondary | ICD-10-CM | POA: Diagnosis not present

## 2014-11-12 ENCOUNTER — Ambulatory Visit (INDEPENDENT_AMBULATORY_CARE_PROVIDER_SITE_OTHER): Payer: Medicare Other | Admitting: Internal Medicine

## 2014-11-12 ENCOUNTER — Encounter: Payer: Self-pay | Admitting: Internal Medicine

## 2014-11-12 VITALS — BP 102/80 | HR 95 | Ht 63.0 in | Wt 169.0 lb

## 2014-11-12 DIAGNOSIS — J45909 Unspecified asthma, uncomplicated: Secondary | ICD-10-CM

## 2014-11-12 MED ORDER — PREDNISONE 5 MG PO TABS: ORAL_TABLET | ORAL | Status: DC

## 2014-11-12 MED ORDER — OMEPRAZOLE 40 MG PO CPDR: 40.0000 mg | DELAYED_RELEASE_CAPSULE | Freq: Every day | ORAL | Status: DC

## 2014-11-12 NOTE — Assessment & Plan Note (Signed)
Asthma: Severe persistent with complications of worsening dyspnea on exertion.  Plan -Patient with worsening obstruction compared to one year ago, FEV1 now 48 % predicted no significant bronchodilator response, also peak flows consistently in the yellow zone (250-380), patient averaging about 300 mL. -Unclear etiology for sudden drop in FEV1, especially with no significant triggers. Possibility being around farm animals and wooded area could be triggering further hyperreactive or bronchospastic response. -Further evaluation with CBC with differential, IgE level, IgG level showed labs with normal levels and with normal ECHO and CT chest.  - review of her pfts from 1 year ago does show some mild flattening of the inspiratory curves, possible VCD could be triggering her bronchospams. Will ask for her ENT to evaluate for possible VCD. -Given her level of dyspnea, will also further evaluate with 2-D echo, evaluating for any hypokinesis with possible cardiomyopathy that could be adding to the dyspnea. -the standard therapy for a patient with asthma is ICS, however given her severity of symptoms and persistent use of albuterol, step up therapy with ICS\LABA is warranted at this time (Advair 500/50) which she has been using, with little improvement.  Now that she is using albuterol 2-3 times per day, her asthma is considered uncontrolled and will further treat with a 1 month steroid taper, small dose, starting at 20mg  and wean by 5mg  weekly - optimize her GERD treatment with omeprazole 40mg  daily -Will also refer to pulmonary rehabilitation. -Will consider hypersensitivity testing at next visit. 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

## 2014-11-12 NOTE — Patient Instructions (Signed)
Please follow up with Dr.Vaught.   We will schedule Pulmonary Rehab [place]. We will send Omeprazole and Prednisone prescriptions to your pharmacy. You will decrease mg of Prednisone by 5mg  weekly.

## 2014-11-12 NOTE — Progress Notes (Signed)
MRN# [id number] Yesenia ShoeLaurie J Anzaldo 03/02/1986   CC: Chief Complaint  Patient presents with  . Follow-up    f/u PFT      Brief History: She is a pleasant 28 year old female accompanied by her mother and interpreter, seen in consultation for worsening asthma and shortness of breath, referred by Dr. Carlynn PurlSowles.  History per the patient and review of medical records Patient is a past medical history of deafness, status post cochlear implant with failure, fibromyalgia, B12 deficiency, moderate asthma, obesity. Her asthma history is as follows: As a teenager exercise-induced bronchospasms that was treated with as needed albuterol, and her early 220s started having more shortness of breath and was previously on Qvar. Triggers: Unsure, mild exacerbation with seasonal changes, more shortness of breath with warm weather (this is very mild). Patient cannot identify any inciting factor 2-3 months ago that made her breathing more difficult. She had a spirometry done at [place] previous pul[place] in [place], 07/11/2013, FEV1 79, FEV1/FVC 74. [facility] showed FEV1 48%, FEV1/FVC 66% on 09/02/2014. There has been no significantrespiratory illness in the past year the patient or her mother can identify a significant drop in her FEV1 and worsening of her symptoms. 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 She previously smoked one to 2 packs per day for 2 years and quit 8 years ago. She denies any E. Cigarettes or water-based tobacco products. Patient has a dog, Yorkie, that stay her,she states that she is very clean, does not have a lot of carpeting, no roaches or rodent infestations in her home. 2 months ago she was diagnosed with a urinary tract infection, shortly after that chest tightness and difficulty breathing, went to the ER, had a negative d-dimer, will was told she had chest wall discomfort and  was given a prednisone taper for shortness of breath. Upon completion of her prednisone taper she develop right-sided facial Bell's palsy. She has also had testing for Lyme, which was negative. On 09/16/2014 her primary care physician changed her controller medication to Advair 250/50, from Qvar, she is also on Ventolin, which she is currently using 3-4 times per day for chest tightness and shortness of breath: She states this does not provide significant relief. She denies fever, chills. 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 She could not recall any tick bites. Patient states that she has a peak flow meter at home and uses it every morning, currently she is averaging about 300 for the past one month, which is the yellow zone for her. Patient denies any cough, runny nose, mucus production.  PLAN: - CT Chest, IgE and IgG level, alpha1-antitrypsin, cardiac ECHO (bubble studies)   Events since last clinic visit: Patient presents today for a follow up visit of asthma (uncontrolled) Advair increased at last visit, no significant improvement.  At this point still with sob, and chest tightness, using albuterol 2-3 times a day for chest tightness and wheezing and "vibration" in chest.  Currently seeing ENT (Dr. 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 She would also like to discuss the results of her labs and imaging studies. Mother present at today's visit.        PMHX:   Past Medical History  Diagnosis Date  . Hyperglycemia   .  Obesity   . GERD (gastroesophageal reflux disease)   . Fibromyalgia   . Paresthesia   . Fatigue   . B12 deficiency   . Moderate asthma   . Bell's palsy     right sided  . Headache   . Deaf   . Premature birth    Surgical Hx:  Past Surgical History  Procedure Laterality Date  . Bartholin gland cyst removal    . Laparoscopy     . Myringotomy with tube placement    . Cochlear implant    . Cochlear implant removal     Family Hx:  Family History  Problem Relation Age of Onset  . Adopted: Yes   Social Hx:   History  Substance Use Topics  . Smoking status: Former Smoker -- 1.00 packs/day for 2 years    Types: Cigarettes  . Smokeless tobacco: Never Used  . Alcohol Use: 0.0 oz/week    0 Not specified per week     Comment: rarely   Medication:   Current Outpatient Rx  Name  Route  Sig  Dispense  Refill  . ADVAIR DISKUS 500-50 MCG/DOSE AEPB   Inhalation   Inhale 1 puff into the lungs 2 (two) times daily.   60 each   1     Dispense as written.   Marland Kitchen albuterol (PROVENTIL HFA;VENTOLIN HFA) 108 (90 BASE) MCG/ACT inhaler   Inhalation   Inhale 2 puffs into the lungs every 4 (four) hours as needed for wheezing or shortness of breath.         . Cyanocobalamin (B-12) 1000 MCG SUBL   Sublingual   Place 1 tablet under the tongue daily.         . cyclobenzaprine (FLEXERIL) 10 MG tablet   Oral   Take 10 mg by mouth daily as needed for muscle spasms.         . drospirenone-ethinyl estradiol (YAZ,GIANVI,LORYNA) 3-0.02 MG tablet   Oral   Take 1 tablet by mouth daily.         . ferrous sulfate 325 (65 FE) MG tablet   Oral   Take 1 tablet by mouth daily.         . Multiple Vitamin (MULTI-VITAMINS) TABS   Oral   Take 1 tablet by mouth daily as needed.         . Turmeric 450 MG CAPS   Oral   Take 1 capsule by mouth daily.            Review of Systems: Gen:  Denies  fever, sweats, chills HEENT: Denies blurred vision, double vision, ear pain, eye pain, hearing loss, nose bleeds, sore throat Cvc:  No dizziness, chest pain or heaviness Resp:   Denies cough or sputum porduction, shortness of breath Gi: Denies swallowing difficulty, stomach pain, nausea or vomiting, diarrhea, constipation, bowel incontinence Gu:  Denies bladder incontinence, burning urine Ext:   No Joint pain, stiffness or  swelling Skin: No skin rash, easy bruising or bleeding or hives Endoc:  No polyuria, polydipsia , polyphagia or weight change Psych: No depression, insomnia or hallucinations  Other:  All other systems negative  Allergies:  Symbicort; Gabapentin; Diclofenac; Duloxetine hcl; Montelukast sodium; Mupirocin; Naproxen; and Sulfa antibiotics  Physical Examination:  VS: BP 102/80 mmHg  Pulse 95  Ht 5\' 3"  (1.6 m)  Wt 169 lb (76.658 kg)  BMI 29.94 kg/m2  SpO2 97%  General Appearance: No distress  Neuro: EXAM: without focal findings, mental status, speech normal, alert  and oriented, cranial nerves 2-12 grossly normal  HEENT: PERRLA, EOM intact, no ptosis, no other lesions noticed Pulmonary:Exam: normal breath sounds., diaphragmatic excursion normal.No wheezing, No rales   Cardiovascular:@ Exam:  Normal S1,S2.  No m/r/g.     Abdomen:Exam: Benign, Soft, non-tender, No masses  Skin:   warm, no rashes, no ecchymosis  Extremities: normal, no cyanosis, clubbing, no edema, warm with normal capillary refill.   Labs results:  BMP No results found for: NA, K, CL, CO2, GLUCOSE, BUN, CREATININE   CBC CBC Latest Ref Rng 10/07/2014  WBC 4.0 - 10.5 K/uL 9.6  Hemoglobin 12.0 - 15.0 g/dL 16.113.2  Hematocrit 09.636.0 - 46.0 % 39.1  Platelets 150.0 - 400.0 K/uL 365.0     Rad results: (The following images and results were reviewed by Dr. Dema SeverinMungal). CT Chest 10/10/2014 COMPARISON:  Two-view chest x-ray 08/13/2014.  FINDINGS: The heart size is normal. No significant pleural or pericardial effusion is present. There is no significant mediastinal or axillary adenopathy.  A thoracic inlet is within normal limits. Minimal residual thymic tissue is normal for age.  Limited imaging of the upper abdomen is unremarkable.  The lung windows demonstrate no focal nodule, mass, or airspace disease apart from minimal dependent atelectasis at the lower lobes.  The bone windows demonstrate no focal lytic or blastic  lesions. Alignment is anatomic.   IMPRESSION: Negative CT of the chest. No acute or focal lesion to explain the patient's symptoms.  Cardiac echo with bubble study 10/10/2014 Summary:  1. Left ventricular ejection fraction, by visual estimation, is 55 to  60%.  2. Normal Echocardiogram.  3. Normal global left ventricular systolic function.  4. All cardiac valves are normal in structure and function.  5. Saline contrast bubble study was negative, with no evidence of any  intracardiac shunt.  6. There is no evidence of ischemic, primary valvular, hypertrophic, or  pulmonary heart disease.   Other: Results for Yesenia ShoeNSELL, Arleene J (MRN [id number]) as of 11/12/2014 13:57  Ref. Range 10/07/2014 14:58  IgG (Immunoglobin G), Serum Latest Range: [phone] mg/dL [phone]  IgA Latest Range: 69-380 mg/dL [phone]  IgM, Serum Latest Range: 52-322 mg/dL 956107  A-1 Antitrypsin Latest Range: 83-199 mg/dL 213213 (H)   Assessment and Plan: No problem-specific assessment & plan notes found for this encounter.   Updated Medication List Outpatient Encounter Prescriptions as of 11/12/2014  Medication Sig  . ADVAIR DISKUS 500-50 MCG/DOSE AEPB Inhale 1 puff into the lungs 2 (two) times daily.  Marland Kitchen. albuterol (PROVENTIL HFA;VENTOLIN HFA) 108 (90 BASE) MCG/ACT inhaler Inhale 2 puffs into the lungs every 4 (four) hours as needed for wheezing or shortness of breath.  . Cyanocobalamin (B-12) 1000 MCG SUBL Place 1 tablet under the tongue daily.  . cyclobenzaprine (FLEXERIL) 10 MG tablet Take 10 mg by mouth daily as needed for muscle spasms.  . drospirenone-ethinyl estradiol (YAZ,GIANVI,LORYNA) 3-0.02 MG tablet Take 1 tablet by mouth daily.  . ferrous sulfate 325 (65 FE) MG tablet Take 1 tablet by mouth daily.  . Multiple Vitamin (MULTI-VITAMINS) TABS Take 1 tablet by mouth daily as needed.  . Turmeric 450 MG CAPS Take 1 capsule by mouth daily.    Orders for this visit: Orders Placed This Encounter  Procedures  . AMB  referral to Rehabilitation    Referral Priority:  Routine    Referral Type:  Consultation    Number of Visits Requested:  1    Thank  you for the visitation and for allowing  [facility] to assist in the care of your patient. Our recommendations are noted above.  Please contact us if we can be of further service.  Vilinda Boehringer, MD [facility] Office Number: [phone]

## 2014-11-18 DIAGNOSIS — R0602 Shortness of breath: Secondary | ICD-10-CM | POA: Diagnosis not present

## 2014-11-18 DIAGNOSIS — J301 Allergic rhinitis due to pollen: Secondary | ICD-10-CM | POA: Diagnosis not present

## 2014-11-18 DIAGNOSIS — J441 Chronic obstructive pulmonary disease with (acute) exacerbation: Secondary | ICD-10-CM | POA: Diagnosis not present

## 2014-11-18 DIAGNOSIS — R490 Dysphonia: Secondary | ICD-10-CM | POA: Diagnosis not present

## 2014-11-18 DIAGNOSIS — R1310 Dysphagia, unspecified: Secondary | ICD-10-CM | POA: Diagnosis not present

## 2014-11-24 DIAGNOSIS — J4540 Moderate persistent asthma, uncomplicated: Secondary | ICD-10-CM | POA: Diagnosis not present

## 2014-11-24 DIAGNOSIS — R202 Paresthesia of skin: Secondary | ICD-10-CM | POA: Diagnosis not present

## 2014-11-24 DIAGNOSIS — Z8669 Personal history of other diseases of the nervous system and sense organs: Secondary | ICD-10-CM | POA: Diagnosis not present

## 2014-11-24 DIAGNOSIS — Z23 Encounter for immunization: Secondary | ICD-10-CM | POA: Diagnosis not present

## 2014-11-24 DIAGNOSIS — Z13220 Encounter for screening for lipoid disorders: Secondary | ICD-10-CM | POA: Diagnosis not present

## 2014-11-24 DIAGNOSIS — E781 Pure hyperglyceridemia: Secondary | ICD-10-CM | POA: Diagnosis not present

## 2014-11-24 DIAGNOSIS — E538 Deficiency of other specified B group vitamins: Secondary | ICD-10-CM | POA: Diagnosis not present

## 2014-11-24 DIAGNOSIS — M797 Fibromyalgia: Secondary | ICD-10-CM | POA: Diagnosis not present

## 2014-11-24 LAB — LIPID PANEL
CHOLESTEROL: 270 mg/dL — AB (ref 0–200)
HDL: 85 mg/dL — AB (ref 35–70)
LDL CALC: 136 mg/dL
Triglycerides: 244 mg/dL — AB (ref 40–160)

## 2014-11-28 ENCOUNTER — Encounter: Payer: Self-pay | Admitting: Otolaryngology

## 2014-12-08 ENCOUNTER — Encounter: Payer: Self-pay | Admitting: Internal Medicine

## 2014-12-08 DIAGNOSIS — R0600 Dyspnea, unspecified: Secondary | ICD-10-CM

## 2014-12-08 MED ORDER — FLUTICASONE-SALMETEROL 500-50 MCG/DOSE IN AEPB: 1.0000 | INHALATION_SPRAY | Freq: Two times a day (BID) | RESPIRATORY_TRACT | Status: DC

## 2014-12-18 ENCOUNTER — Encounter: Payer: Self-pay | Admitting: Internal Medicine

## 2014-12-25 ENCOUNTER — Encounter: Payer: Self-pay | Admitting: Internal Medicine

## 2014-12-25 ENCOUNTER — Ambulatory Visit (INDEPENDENT_AMBULATORY_CARE_PROVIDER_SITE_OTHER): Payer: Medicare Other | Admitting: Internal Medicine

## 2014-12-25 VITALS — BP 114/80 | HR 102 | Temp 98.2°F | Ht 63.0 in | Wt 167.0 lb

## 2014-12-25 DIAGNOSIS — J45909 Unspecified asthma, uncomplicated: Secondary | ICD-10-CM | POA: Diagnosis not present

## 2014-12-25 DIAGNOSIS — R0600 Dyspnea, unspecified: Secondary | ICD-10-CM

## 2014-12-25 MED ORDER — PREDNISONE 5 MG PO TABS: ORAL_TABLET | ORAL | Status: DC

## 2014-12-25 NOTE — Progress Notes (Signed)
MRN# [id number] Yesenia ShoeLaurie J Kazee 03/18/1986   CC:" Short of breath" Chief Complaint  Patient presents with  . Follow-up    pt noticed after stopping predinisone breathing began labored. She had bad cough last night, before that it was a sl. cough. Pt felt better on Prednisone. Pt has had hoarseness this week. Pt has noticed increase in chest tightness.      10/07/2014 History: She is a pleasant 29 year old female accompanied by her mother and interpreter, seen in consultation for worsening asthma and shortness of breath, referred by Dr. Carlynn PurlSowles.  History per the patient and review of medical records Patient is a past medical history of deafness, status post cochlear implant with failure, fibromyalgia, B12 deficiency, moderate asthma, obesity. Her asthma history is as follows: As a teenager exercise-induced bronchospasms that was treated with as needed albuterol, and her early 8620s started having more shortness of breath and was previously on Qvar. Triggers: Unsure, mild exacerbation with seasonal changes, more shortness of breath with warm weather (this is very mild). Patient cannot identify any inciting factor 2-3 months ago that made her breathing more difficult. She had a spirometry done at her previous pulmon[place] in [place], 07/11/2013, FEV1 79, FEV1/FVC 74. Her [facility] showed FEV1 48%, FEV1/FVC 66% on 09/02/2014. There has been no significantrespiratory illness in the past year the patient or her mother can identify a significant drop in her FEV1 and worsening of her symptoms. 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 She previously smoked one to 2 packs per day for 2 years and quit 8 years ago. She denies any E. Cigarettes or water-based tobacco products. Patient has a dog, Yorkie, that stay her,she states that she is very clean, does not have a lot of carpeting, no roaches  or rodent infestations in her home. 2 months ago she was diagnosed with a urinary tract infection, shortly after that chest tightness and difficulty breathing, went to the ER, had a negative d-dimer, will was told she had chest wall discomfort and was given a prednisone taper for shortness of breath. Upon completion of her prednisone taper she develop right-sided facial Bell's palsy. She has also had testing for Lyme, which was negative. On 09/16/2014 her primary care physician changed her controller medication to Advair 250/50, from Qvar, she is also on Ventolin, which she is currently using 3-4 times per day for chest tightness and shortness of breath: She states this does not provide significant relief. She denies fever, chills. 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 She could not recall any tick bites. Patient states that she has a peak flow meter at home and uses it every morning, currently she is averaging about 300 for the past one month, which is the yellow zone for her. Patient denies any cough, runny nose, mucus production.  PLAN: - CT Chest, IgE and IgG level, alpha1-antitrypsin, cardiac ECHO (bubble studies)   ROV 11/12/14 Patient presents today for a follow up visit of asthma (uncontrolled) Advair increased at last visit, no significant improvement.  At this point still with sob, and chest tightness, using albuterol 2-3 times a day for chest tightness and wheezing and "vibration" in chest.  Currently seeing ENT (Dr. 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 She would also like  to discuss the results of her labs and imaging studies. Mother present at today's visit.  Plan  - mild asthma exacerbation - Prednisone taper over 1 month, advair, prn albuterol   Events since last clinic visit: Since her last visit she has had a 1 month trial of prednisone,  which greatly improved her symptoms (chest tightness and sob). She stated that she complete steroid last week, and this week the symptoms are back, she is currently using albuterol 2-3 times per day; previously on steroid maybe 1-2 times per day. Her family is planning to remove the rugs\carpet and get hypoallergenic pillows and sheets, due to dust mite allergy.  She has seen ENT (Dr. Andee Poles) and was told that her vocal cords were fine and was told that she had dust mite allergy.  She had RAST testing done.  She has not had allergy testing since childhood.    PMHX:   Past Medical History  Diagnosis Date  . Hyperglycemia   . Obesity   . GERD (gastroesophageal reflux disease)   . Fibromyalgia   . Paresthesia   . Fatigue   . B12 deficiency   . Moderate asthma   . Bell's palsy     right sided  . Headache   . Deaf   . Premature birth    Surgical Hx:  Past Surgical History  Procedure Laterality Date  . Bartholin gland cyst removal    . Laparoscopy    . Myringotomy with tube placement    . Cochlear implant    . Cochlear implant removal     Family Hx:  Family History  Problem Relation Age of Onset  . Adopted: Yes   Social Hx:   History  Substance Use Topics  . Smoking status: Former Smoker -- 1.00 packs/day for 2 years    Types: Cigarettes  . Smokeless tobacco: Never Used  . Alcohol Use: 0.0 oz/week    0 Not specified per week     Comment: rarely   Medication:   Current Outpatient Rx  Name  Route  Sig  Dispense  Refill  . albuterol (PROVENTIL HFA;VENTOLIN HFA) 108 (90 BASE) MCG/ACT inhaler   Inhalation   Inhale 2 puffs into the lungs every 4 (four) hours as needed for wheezing or shortness of breath.         . Cyanocobalamin (B-12) 1000 MCG SUBL   Sublingual   Place 1 tablet under the tongue daily.         . cyclobenzaprine (FLEXERIL) 10 MG tablet   Oral   Take 10 mg by mouth daily as needed for muscle spasms.         . drospirenone-ethinyl estradiol  (YAZ,GIANVI,LORYNA) 3-0.02 MG tablet   Oral   Take 1 tablet by mouth daily.         . ferrous sulfate 325 (65 FE) MG tablet   Oral   Take 1 tablet by mouth daily.         . Fluticasone-Salmeterol (ADVAIR DISKUS) 500-50 MCG/DOSE AEPB   Inhalation   Inhale 1 puff into the lungs 2 (two) times daily.   60 each   5   . Multiple Vitamin (MULTI-VITAMINS) TABS   Oral   Take 1 tablet by mouth daily as needed.         Marland Kitchen omeprazole (PRILOSEC) 40 MG capsule   Oral   Take 1 capsule (40 mg total) by mouth daily.   30 capsule   3   . traMADol (ULTRAM)  50 MG tablet   Oral   Take 50 mg by mouth every 8 (eight) hours as needed.         . Turmeric 450 MG CAPS   Oral   Take 1 capsule by mouth daily.            Review of Systems: Gen:  Denies  fever, sweats, chills HEENT: Denies blurred vision, double vision, ear pain, eye pain, hearing loss, nose bleeds, sore throat Cvc:  No dizziness, chest pain or heaviness Resp:   Some sob and chest tightness Gi: Denies swallowing difficulty, stomach pain, nausea or vomiting, diarrhea, constipation, bowel incontinence Gu:  Denies bladder incontinence, burning urine Ext:   No Joint pain, stiffness or swelling Skin: No skin rash, easy bruising or bleeding or hives Endoc:  No polyuria, polydipsia , polyphagia or weight change Psych: No depression, insomnia or hallucinations  Other:  All other systems negative  Allergies:  Symbicort; Gabapentin; Diclofenac; Duloxetine hcl; Montelukast sodium; Mupirocin; Naproxen; and Sulfa antibiotics  Physical Examination:  VS: BP 114/80 mmHg  Pulse 102  Temp(Src) 98.2 F (36.8 C) (Oral)  Ht 5\' 3"  (1.6 m)  Wt 167 lb (75.751 kg)  BMI 29.59 kg/m2  SpO2 97%  General Appearance: No distress  Neuro: EXAM: without focal findings, mental status, speech normal, alert and oriented, cranial nerves 2-12 grossly normal  HEENT: PERRLA, EOM intact, no ptosis, no other lesions noticed Pulmonary:Exam: normal  breath sounds., diaphragmatic excursion normal.No wheezing, No rales   Cardiovascular:@ Exam:  Normal S1,S2.  No m/r/g.     Abdomen:Exam: Benign, Soft, non-tender, No masses   Skin:   warm, no rashes, no ecchymosis  Extremities: normal, no cyanosis, clubbing, no edema, warm with normal capillary refill.   Labs results:  BMP No results found for: NA, K, CL, CO2, GLUCOSE, BUN, CREATININE   CBC CBC Latest Ref Rng 10/07/2014  WBC 4.0 - 10.5 K/uL 9.6  Hemoglobin 12.0 - 15.0 g/dL 16.1  Hematocrit 09.6 - 46.0 % 39.1  Platelets 150.0 - 400.0 K/uL 365.0  Results for SUNI, JARNAGIN (MRN [id number]) as of 12/25/2014 15:43  Ref. Range 10/07/2014 14:58  IgG (Immunoglobin G), Serum Latest Range: [phone] mg/dL 914  IgE (Immunoglobulin E), Serum Latest Range: <115 kU/L 15  IgA Latest Range: 69-380 mg/dL 782       Assessment and Plan: Intrinsic asthma Asthma: Moderate persistent Sharunda has had difficult to control asthma over the past 6 months, she has had a high risk CT, 2-D echo, multiple chest x-rays, ENT evaluation, all of which showed no significant abnormalities. On her most recent PFT she has moderate obstruction. At this time unable to identify a trigger on etiology for her worsening asthma. One thought would be an allergic component, she had a recent RAST testing by ENT which showed a dust mite allergy. She was placed on a 1 month slow steroid taper starting at 20 mg of prednisone, taper by 5 mg weekly; during the last week of steroids, 5 mg daily, she exhibited minimal symptoms (mild shortness of breath and chest tightness). However, after her steroid taper completion within 3-4 days she started to have symptoms again.  Plan - given her recent dust mite allergy on RAST testing, environmental control has been initiated: Changing pillowcases, changing of bed covers, removal of carpet from the house and drugs. She is also decided to use hypoallergenic sheets. -Given that she had moderate relief  of symptoms during the last week of steroid taper will give a  trial of a small dose of steroid. Patient is advised to use 2.5 mg of steroid daily, is still having symptoms after 1 week of this dose that she is advised to increase to 5 mg daily (5 mg daily is the maximum dose of steroid for her). -will referred to Dr. Fannie Knee for hypersensitivity/skin testing. -Patient with worsening obstruction compared to one year ago, FEV1 now 48 % predicted no significant bronchodilator response, also peak flows consistently in the yellow zone (250-380), patient averaging about 300 mL. -Unclear etiology for sudden drop in FEV1, especially with no significant triggers. -Further evaluation with CBC with differential, IgE level, IgG level showed labs with normal levels and with normal ECHO and CT chest.  -review of her pfts from 1 year ago does show some mild flattening of the inspiratory curves, possible VCD could be triggering her bronchospams. ENT has seen her, laryngoscopy showed no abnormal vocal cord movement. -the standard therapy for a patient with asthma is ICS, however given her severity of symptoms and persistent use of albuterol, step up therapy with ICS\LABA is warranted at this time (Advair 500/50) which she has been using, with little improvement.    Dyspnea See plan for asthma.      Updated Medication List Outpatient Encounter Prescriptions as of 12/25/2014  Medication Sig  . albuterol (PROVENTIL HFA;VENTOLIN HFA) 108 (90 BASE) MCG/ACT inhaler Inhale 2 puffs into the lungs every 4 (four) hours as needed for wheezing or shortness of breath.  . Cyanocobalamin (B-12) 1000 MCG SUBL Place 1 tablet under the tongue daily.  . cyclobenzaprine (FLEXERIL) 10 MG tablet Take 10 mg by mouth daily as needed for muscle spasms.  . drospirenone-ethinyl estradiol (YAZ,GIANVI,LORYNA) 3-0.02 MG tablet Take 1 tablet by mouth daily.  . ferrous sulfate 325 (65 FE) MG tablet Take 1 tablet by mouth daily.  .  Fluticasone-Salmeterol (ADVAIR DISKUS) 500-50 MCG/DOSE AEPB Inhale 1 puff into the lungs 2 (two) times daily.  . Multiple Vitamin (MULTI-VITAMINS) TABS Take 1 tablet by mouth daily as needed.  Marland Kitchen omeprazole (PRILOSEC) 40 MG capsule Take 1 capsule (40 mg total) by mouth daily.  . traMADol (ULTRAM) 50 MG tablet Take 50 mg by mouth every 8 (eight) hours as needed.  . Turmeric 450 MG CAPS Take 1 capsule by mouth daily.  . predniSONE (DELTASONE) 5 MG tablet Take 2.5 tabs once daily.  . [DISCONTINUED] predniSONE (DELTASONE) 5 MG tablet Take 4 tablets for 7 days, 3 tablets for 7 days, 2 tablets for 7 days, 1 tablet for 7 days (Patient not taking: Reported on 12/25/2014)    Orders for this visit: Orders Placed This Encounter  Procedures  . Ambulatory referral to Allergy    Referral Priority:  Routine    Referral Type:  Allergy Testing    Referral Reason:  Specialty Services Required    Requested Specialty:  Allergy    Number of Visits Requested:  1    Thank  you for the visitation and for allowing  [facility] to assist in the care of your patient. Our recommendations are noted above.  Please contact us if we can be of further service.  Stephanie Acre, MD [facility] Office Number: [phone]

## 2014-12-25 NOTE — Assessment & Plan Note (Addendum)
Asthma: Moderate persistent Yesenia Wood has had difficult to control asthma over the past 6 months, she has had a high risk CT, 2-D echo, multiple chest x-rays, ENT evaluation, all of which showed no significant abnormalities. On her most recent PFT she has moderate obstruction. At this time unable to identify a trigger on etiology for her worsening asthma. One thought would be an allergic component, she had a recent RAST testing by ENT which showed a dust mite allergy. She was placed on a 1 month slow steroid taper starting at 20 mg of prednisone, taper by 5 mg weekly; during the last week of steroids, 5 mg daily, she exhibited minimal symptoms (mild shortness of breath and chest tightness). However, after her steroid taper completion within 3-4 days she started to have symptoms again.  Plan - given her recent dust mite allergy on RAST testing, environmental control has been initiated: Changing pillowcases, changing of bed covers, removal of carpet from the house and drugs. She is also decided to use hypoallergenic sheets. -Given that she had moderate relief of symptoms during the last week of steroid taper will give a trial of a small dose of steroid. Patient is advised to use 2.5 mg of steroid daily, is still having symptoms after 1 week of this dose that she is advised to increase to 5 mg daily (5 mg daily is the maximum dose of steroid for her). -will referred to Dr. Fannie Kneelint Young for hypersensitivity/skin testing. -Patient with worsening obstruction compared to one year ago, FEV1 now 48 % predicted no significant bronchodilator response, also peak flows consistently in the yellow zone (250-380), patient averaging about 300 mL. -Unclear etiology for sudden drop in FEV1, especially with no significant triggers. -Further evaluation with CBC with differential, IgE level, IgG level showed labs with normal levels and with normal ECHO and CT chest.  -review of her pfts from 1 year ago does show some mild  flattening of the inspiratory curves, possible VCD could be triggering her bronchospams. ENT has seen her, laryngoscopy showed no abnormal vocal cord movement. -the standard therapy for a patient with asthma is ICS, however given her severity of symptoms and persistent use of albuterol, step up therapy with ICS\LABA is warranted at this time (Advair 500/50) which she has been using, with little improvement.

## 2014-12-25 NOTE — Assessment & Plan Note (Signed)
See plan for asthma  

## 2014-12-25 NOTE — Patient Instructions (Addendum)
Follow up with Dr. Dema SeverinMungal in 3 months  Asthma - moderate - cont with your inhalers (Advair and as needed ventolin) - environmental control (remove carpeting, old rugs, clean sheets and pillows cases, etc) - Prednisone 2.5mg  daily, if no improvement after 1 week, then goto 5mg  daily - we will try this for 1 month, call for refill of prednisone if symptoms return after a month.  - allergy testing/hypersentivity testing with Dr. Fannie [facility]ne[facility]int [facility] at [facility] - expect a call for an appointment

## 2014-12-29 DIAGNOSIS — M797 Fibromyalgia: Secondary | ICD-10-CM | POA: Diagnosis not present

## 2014-12-29 DIAGNOSIS — J4540 Moderate persistent asthma, uncomplicated: Secondary | ICD-10-CM | POA: Diagnosis not present

## 2014-12-29 DIAGNOSIS — E538 Deficiency of other specified B group vitamins: Secondary | ICD-10-CM | POA: Diagnosis not present

## 2014-12-29 DIAGNOSIS — R3911 Hesitancy of micturition: Secondary | ICD-10-CM | POA: Diagnosis not present

## 2014-12-29 DIAGNOSIS — H9193 Unspecified hearing loss, bilateral: Secondary | ICD-10-CM | POA: Diagnosis not present

## 2014-12-29 DIAGNOSIS — B078 Other viral warts: Secondary | ICD-10-CM | POA: Diagnosis not present

## 2015-01-02 ENCOUNTER — Encounter: Payer: Self-pay | Admitting: Internal Medicine

## 2015-02-04 ENCOUNTER — Encounter: Payer: Self-pay | Admitting: Internal Medicine

## 2015-02-04 DIAGNOSIS — J45909 Unspecified asthma, uncomplicated: Secondary | ICD-10-CM

## 2015-02-04 MED ORDER — PREDNISONE 5 MG PO TABS: ORAL_TABLET | ORAL | Status: DC

## 2015-02-04 NOTE — Telephone Encounter (Signed)
Sent message to Dr. Dema SeverinMungal.  Dr. Dema SeverinMungal, please advise.

## 2015-02-04 NOTE — Telephone Encounter (Signed)
Rx refilled.  Pt notified via web.  Nothing further needed.

## 2015-02-08 ENCOUNTER — Encounter: Payer: Self-pay | Admitting: Internal Medicine

## 2015-02-09 NOTE — Telephone Encounter (Signed)
Dr. Dema SeverinMungal please advise. Should pt continue 5mg  of prednisone until she comes back to see you?

## 2015-02-12 ENCOUNTER — Ambulatory Visit (INDEPENDENT_AMBULATORY_CARE_PROVIDER_SITE_OTHER): Payer: Medicare Other | Admitting: Internal Medicine

## 2015-02-12 ENCOUNTER — Encounter: Payer: Self-pay | Admitting: Internal Medicine

## 2015-02-12 VITALS — BP 120/76 | HR 98 | Ht 63.0 in | Wt 173.4 lb

## 2015-02-12 DIAGNOSIS — J45909 Unspecified asthma, uncomplicated: Secondary | ICD-10-CM

## 2015-02-12 NOTE — Patient Instructions (Signed)
Our allergy nurse will help schedule allergy skin testing. Antihistamines- including benadryl, claritin, zyrtec, allegra, etc., and products that contain antihistamines, like cough and cold remedies and sleep medicines- will block the skin tests. So PLEASE- No antihistamines for 3 days before you return for testing.

## 2015-02-12 NOTE — Progress Notes (Signed)
Dr Dema Severin 10/07/2014 History:  She is a pleasant 29 year old female accompanied by her mother and interpreter, seen in consultation for worsening asthma and shortness of breath, referred by Dr. Carlynn Purl.  History per the patient and review of medical records Patient is a past medical history of deafness, status post cochlear implant with failure, fibromyalgia, B12 deficiency, moderate asthma, obesity. Her asthma history is as follows: As a teenager exercise-induced bronchospasms that was treated with as needed albuterol, and her early 60s started having more shortness of breath and was previously on Qvar. Triggers: Unsure, mild exacerbation with seasonal changes, more shortness of breath with warm weather (this is very mild). Patient cannot identify any inciting factor 2-3 months ago that made her breathing more difficult. She had a spirometry done at her previous pulmonologist in [place], 07/11/2013, FEV1 79, FEV1/FVC 74. Her repeat spirometry at [facility] showed FEV1 48%, FEV1/FVC 66% on 09/02/2014. There has been no significantrespiratory illness in the past year the patient or her mother can identify a significant drop in her FEV1 and worsening of her symptoms. 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 She previously smoked one to 2 packs per day for 2 years and quit 8 years ago. She denies any E. Cigarettes or water-based tobacco products. Patient has a dog, Yorkie, that stay her,she states that she is very clean, does not have a lot of carpeting, no roaches or rodent infestations in her home. 2 months ago she was diagnosed with a urinary tract infection, shortly after that chest tightness and difficulty breathing, went to the ER, had a negative d-dimer, will was told she had chest wall discomfort and was given a prednisone taper for shortness of breath. Upon completion of her prednisone taper she develop  right-sided facial Bell's palsy. She has also had testing for Lyme, which was negative. On 09/16/2014 her primary care physician changed her controller medication to Advair 250/50, from Qvar, she is also on Ventolin, which she is currently using 3-4 times per day for chest tightness and shortness of breath: She states this does not provide significant relief. She denies fever, chills. 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 She could not recall any tick bites. Patient states that she has a peak flow meter at home and uses it every morning, currently she is averaging about 300 for the past one month, which is the yellow zone for her. Patient denies any cough, runny nose, mucus production.  PLAN: - CT Chest, IgE and IgG level, alpha1-antitrypsin, cardiac ECHO (bubble studies)   Events since last clinic visit: Dr Dema Severin: Since her last visit she has had a 1 month trial of prednisone, which greatly improved her symptoms (chest tightness and sob). She stated that she complete steroid last week, and this week the symptoms are back, she is currently using albuterol 2-3 times per day; previously on steroid maybe 1-2 times per day. Her family is planning to remove the rugs\carpet and get hypoallergenic pillows and sheets, due to dust mite allergy.  She has seen ENT (Dr. Andee Poles) and was told that her vocal cords were fine and was told that she had dust mite allergy.  She had RAST testing done.  She has not had allergy testing since childhood.     02/12/15- 29 yoF former  smoker, deaf, New for Allergy Consultation referred courtesy of Dr Dema SeverinMungal-? asthma and allergies. Mom states pt is having hard time getting asthma managed. VM sent to us to see about doing sking testing. Total IgE 15 10/07/14   Mother(adoptive) and sign language interpreter here Having more frequent asthma episodes in the past  year, with no recognized seasonal or exposure pattern. Bothered some by irritant smells, tobacco smoke. Did have EIA.Marland Kitchen. Little hayfever/ rhinitis. Some rash in past. No food allergy. Blood Allergy test by ENT in [place] recently was elevated only for dust mite. She had allergy skin testing at age 89 "very allergic", with allergy shots then.  Hx tonsillectomy and tubes in ears. Environment- living in apartment within parents' home. Encasings. Not obviously moldy. Carpet being replaced with vinyl flooring. Keeps clean. 1 dog. Not working outside home.  Prior to Admission medications   Medication Sig Start Date End Date Taking? Authorizing Provider  albuterol (PROVENTIL HFA;VENTOLIN HFA) 108 (90 BASE) MCG/ACT inhaler Inhale 2 puffs into the lungs every 4 (four) hours as needed for wheezing or shortness of breath.   Yes Historical Provider, MD  Ascorbic Acid (VITAMIN C) 1000 MG tablet Take 1,000 mg by mouth daily.   Yes Historical Provider, MD  Cyanocobalamin (B-12) 1000 MCG SUBL Place 1 tablet under the tongue daily.   Yes Historical Provider, MD  cyclobenzaprine (FLEXERIL) 10 MG tablet Take 10 mg by mouth daily as needed for muscle spasms.   Yes Historical Provider, MD  drospirenone-ethinyl estradiol (YAZ,GIANVI,LORYNA) 3-0.02 MG tablet Take 1 tablet by mouth daily.   Yes Historical Provider, MD  ferrous sulfate 325 (65 FE) MG tablet Take 1 tablet by mouth daily.   Yes Historical Provider, MD  Fluticasone-Salmeterol (ADVAIR DISKUS) 500-50 MCG/DOSE AEPB Inhale 1 puff into the lungs 2 (two) times daily. 12/08/14 12/08/15 Yes Vishal Mungal, MD  Multiple Vitamin (MULTI-VITAMINS) TABS Take 1 tablet by mouth daily as needed.   Yes Historical Provider, MD  predniSONE (DELTASONE) 5 MG tablet Take 2.5 tabs once daily. Patient taking differently: Take 5 mg by mouth daily.  02/04/15  Yes Vishal Mungal, MD  traMADol (ULTRAM) 50 MG tablet Take 50 mg by mouth every 8 (eight) hours as needed.   Yes Historical  Provider, MD  Turmeric 450 MG CAPS Take 1 capsule by mouth daily.   Yes Historical Provider, MD   Past Medical History  Diagnosis Date  . Hyperglycemia   . Obesity   . GERD (gastroesophageal reflux disease)   . Fibromyalgia   . Paresthesia   . Fatigue   . B12 deficiency   . Moderate asthma   . Bell's palsy     right sided  . Headache   . Deaf   . Premature birth    Past Surgical History  Procedure Laterality Date  . Bartholin gland cyst removal    . Laparoscopy    . Myringotomy with tube placement    . Cochlear implant    . Cochlear implant removal     Family History  Problem Relation Age of Onset  . Adopted: Yes   History   Social History  . Marital Status: Single    Spouse Name: N/A  . Number of Children: N/A  . Years of Education: N/A   Occupational History  . Not on file.   Social History Main Topics  . Smoking status: Former Smoker -- 1.00 packs/day for 2 years    Types: Cigarettes  . Smokeless tobacco: Never Used  . Alcohol  Use: 0.0 oz/week    0 Standard drinks or equivalent per week     Comment: rarely  . Drug Use: No  . Sexual Activity: Not on file   Other Topics Concern  . Not on file   Social History Narrative   ROS-see HPI   Negative unless "+" Constitutional:    weight loss, night sweats, fevers, chills, fatigue, lassitude. HEENT:   +headaches, difficulty swallowing, tooth/dental problems, +sore throat,       sneezing, itching, +ear ache, +nasal congestion, post nasal drip, snoring CV:    +chest pain, orthopnea, PND, swelling in lower extremities, anasarca,                                  dizziness, +palpitations Resp:   +shortness of breath with exertion or at rest.                productive cough,   +non-productive cough, coughing up of blood.              change in color of mucus.  wheezing.   Skin:    rash or lesions. GI:  No-   heartburn, indigestion, abdominal pain, nausea, vomiting, diarrhea,                 change in bowel  habits, loss of appetite GU: dysuria, change in color of urine, no urgency or frequency.   flank pain. MS:  + joint pain, stiffness, decreased range of motion, back pain. Neuro-     nothing unusual Psych:  change in mood or affect.  depression or +anxiety.   memory loss.  OBJ- Physical Exam General- Alert, Oriented, Affect-appropriate, Distress- none acute Skin- rash-none, lesions- none, excoriation- none Lymphadenopathy- none Head- atraumatic            Eyes- Gross vision intact, PERRLA, conjunctivae and secretions clear            Ears- Deaf, signing, seems to hear mother            Nose- Clear, no-Septal dev, mucus, polyps, erosion, perforation             Throat- Mallampati II-III , mucosa clear , drainage- none, tonsils- atrophic Neck- flexible , trachea midline, no stridor , thyroid nl, carotid no bruit Chest - symmetrical excursion , unlabored           Heart/CV- RRR , no murmur , no gallop  , no rub, nl s1 s2                           - JVD- none , edema- none, stasis changes- none, varices- none           Lung- clear to P&A, wheeze- none, cough- none , dullness-none, rub- none           Chest wall-  Abd-  Br/ Gen/ Rectal- Not done, not indicated Extrem- cyanosis- none, clubbing, none, atrophy- none, strength- nl Neuro- grossly intact to observation

## 2015-02-12 NOTE — Assessment & Plan Note (Signed)
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 Role of environmental atopic triggers now is unclear. Total IgE not high, and reported in vitro test showed only dust mites. Plan- return tor allergy skin testing as requested. We discussed this with patient and her motherl

## 2015-02-23 ENCOUNTER — Telehealth: Payer: Self-pay | Admitting: Internal Medicine

## 2015-02-23 ENCOUNTER — Encounter: Payer: Self-pay | Admitting: Internal Medicine

## 2015-02-23 DIAGNOSIS — J45909 Unspecified asthma, uncomplicated: Secondary | ICD-10-CM

## 2015-02-23 MED ORDER — PREDNISONE 5 MG PO TABS: 5.0000 mg | ORAL_TABLET | Freq: Every day | ORAL | Status: DC

## 2015-02-23 NOTE — Telephone Encounter (Signed)
Per the 3.13.16 mychart email chain, VM recommended pt increase her prednisone to mg daily until seen back in the office Pt is scheduled for allergy testing with CY on 4.6.16 and is due for appt with VM in April Called [facility] and spoke with Victorino DikeJennifer - gave verbal authorization for prednisone 5mg  once daily #30 with 1 additional refill  There is also a mychart email chain from pt regarding same issue - pt to be notified via that portal Nothing further needed at this time; will sign off

## 2015-02-23 NOTE — Telephone Encounter (Signed)
Per 3.28.16 mychart message from pt: Message     [facility]one. But was told insurance will not appeoved til 02-27-15. So i can't get it until then.     Can you fix it with pharmacy?        Thank you    Per the 3.13.16 mychart email chain, pt was instructed to continue with her prednisone 5mg  daily but rx was not sent to pharmacy. Pt is due for allergy skin testing w/ CY on 4.6.16 and due for follow up with VM in April. Rx telephoned to pt's pharmacy (see 3.28.16 phone note) Email response sent to patient - also encouraged her to contact the office to schedule her follow up w/ VM

## 2015-02-24 ENCOUNTER — Encounter
Admit: 2015-02-24 | Disposition: A | Discharge status: Home / Self Care | Payer: Self-pay | Attending: Internal Medicine | Admitting: Internal Medicine

## 2015-02-24 DIAGNOSIS — J45909 Unspecified asthma, uncomplicated: Secondary | ICD-10-CM | POA: Diagnosis not present

## 2015-02-27 ENCOUNTER — Encounter
Admit: 2015-02-27 | Disposition: A | Discharge status: Home / Self Care | Payer: Self-pay | Attending: Internal Medicine | Admitting: Internal Medicine

## 2015-02-27 DIAGNOSIS — J45909 Unspecified asthma, uncomplicated: Secondary | ICD-10-CM | POA: Diagnosis not present

## 2015-03-02 DIAGNOSIS — J45909 Unspecified asthma, uncomplicated: Secondary | ICD-10-CM | POA: Diagnosis not present

## 2015-03-04 ENCOUNTER — Encounter: Payer: Self-pay | Admitting: Internal Medicine

## 2015-03-04 ENCOUNTER — Ambulatory Visit (INDEPENDENT_AMBULATORY_CARE_PROVIDER_SITE_OTHER): Payer: Medicare Other | Admitting: Internal Medicine

## 2015-03-04 VITALS — BP 102/72 | HR 121 | Ht 63.0 in | Wt 176.0 lb

## 2015-03-04 DIAGNOSIS — J4540 Moderate persistent asthma, uncomplicated: Secondary | ICD-10-CM

## 2015-03-04 DIAGNOSIS — R0600 Dyspnea, unspecified: Secondary | ICD-10-CM | POA: Diagnosis not present

## 2015-03-04 NOTE — Patient Instructions (Signed)
The strongest skin test reactions were for tree pollens and house dust.  Consider the dust control information we gave.  You and Dr Dema SeverinMungal can decide if any treatment such as allergy shots is needed, beyond what is being done through his office. I will be happy to see you again if needed.

## 2015-03-04 NOTE — Progress Notes (Signed)
Dr Dema Severin 10/07/2014 History:  She is a pleasant 29 year old female accompanied by her mother and interpreter, seen in consultation for worsening asthma and shortness of breath, referred by Dr. Carlynn Purl.  History per the patient and review of medical records Patient is a past medical history of deafness, status post cochlear implant with failure, fibromyalgia, B12 deficiency, moderate asthma, obesity. Her asthma history is as follows: As a teenager exercise-induced bronchospasms that was treated with as needed albuterol, and her early 60s started having more shortness of breath and was previously on Qvar. Triggers: Unsure, mild exacerbation with seasonal changes, more shortness of breath with warm weather (this is very mild). Patient cannot identify any inciting factor 2-3 months ago that made her breathing more difficult. She had a spirometry done at her previous pulmonologist in [place], 07/11/2013, FEV1 79, FEV1/FVC 74. Her repeat spirometry at [facility] showed FEV1 48%, FEV1/FVC 66% on 09/02/2014. There has been no significantrespiratory illness in the past year the patient or her mother can identify a significant drop in her FEV1 and worsening of her symptoms. 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 She previously smoked one to 2 packs per day for 2 years and quit 8 years ago. She denies any E. Cigarettes or water-based tobacco products. Patient has a dog, Yorkie, that stay her,she states that she is very clean, does not have a lot of carpeting, no roaches or rodent infestations in her home. 2 months ago she was diagnosed with a urinary tract infection, shortly after that chest tightness and difficulty breathing, went to the ER, had a negative d-dimer, will was told she had chest wall discomfort and was given a prednisone taper for shortness of breath. Upon completion of her prednisone taper she develop  right-sided facial Bell's palsy. She has also had testing for Lyme, which was negative. On 09/16/2014 her primary care physician changed her controller medication to Advair 250/50, from Qvar, she is also on Ventolin, which she is currently using 3-4 times per day for chest tightness and shortness of breath: She states this does not provide significant relief. She denies fever, chills. 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 She could not recall any tick bites. Patient states that she has a peak flow meter at home and uses it every morning, currently she is averaging about 300 for the past one month, which is the yellow zone for her. Patient denies any cough, runny nose, mucus production.  PLAN: - CT Chest, IgE and IgG level, alpha1-antitrypsin, cardiac ECHO (bubble studies)   Events since last clinic visit: Dr Dema Severin: Since her last visit she has had a 1 month trial of prednisone, which greatly improved her symptoms (chest tightness and sob). She stated that she complete steroid last week, and this week the symptoms are back, she is currently using albuterol 2-3 times per day; previously on steroid maybe 1-2 times per day. Her family is planning to remove the rugs\carpet and get hypoallergenic pillows and sheets, due to dust mite allergy.  She has seen ENT (Dr. Andee Poles) and was told that her vocal cords were fine and was told that she had dust mite allergy.  She had RAST testing done.  She has not had allergy testing since childhood.     02/12/15- 29 yoF former  smoker, deaf, New for Allergy Consultation referred courtesy of Dr Dema SeverinMungal-? asthma and allergies. Mom states pt is having hard time getting asthma managed. VM sent to us to see about doing sking testing. Total IgE 15 10/07/14   Mother(adoptive) and sign language interpreter here Having more frequent asthma episodes in the past  year, with no recognized seasonal or exposure pattern. Bothered some by irritant smells, tobacco smoke. Did have EIA.Marland Kitchen. Little hayfever/ rhinitis. Some rash in past. No food allergy. Blood Allergy test by ENT in [place] recently was elevated only for dust mite. She had allergy skin testing at age 89 "very allergic", with allergy shots then.  Hx tonsillectomy and tubes in ears. Environment- living in apartment within parents' home. Encasings. Not obviously moldy. Carpet being replaced with vinyl flooring. Keeps clean. 1 dog. Not working outside home.  03/04/15- 29 yoF former smoker, deaf, New for Allergy Consultation referred courtesy of Dr Dema SeverinMungal-? asthma and allergies. Mom states pt is having hard time getting asthma managed. VM sent to us to see about doing sking testing. Total IgE 15 10/07/14   Mother(adoptive) and sign language interpreter here For allergy skin testing- feels well today with no new concerns expressed No antihistamines, OTC cough syrups or sleep aids. Allergy skin testing 03/04/15-  Strong Positive esp for trees and dust mites, some molds.  ROS-see HPI   Negative unless "+" Constitutional:    weight loss, night sweats, fevers, chills, fatigue, lassitude. HEENT:   +headaches, difficulty swallowing, tooth/dental problems, +sore throat,       sneezing, itching, ear ache, +nasal congestion, post nasal drip, snoring CV:    chest pain, orthopnea, PND, swelling in lower extremities, anasarca,                                  dizziness, +palpitations Resp:   +shortness of breath with exertion or at rest.                productive cough,   +non-productive cough, coughing up of blood.              change in color of mucus.  wheezing.   Skin:    rash or lesions. GI:  No-   heartburn, indigestion, abdominal pain, nausea, vomiting,  GU:  MS:  + joint pain, stiffness,  Neuro-     nothing unusual Psych:  change in mood or affect.  depression or +anxiety.   memory loss.  OBJ- Physical  Exam General- Alert, Oriented, Affect-appropriate, Distress- none acute Skin- rash-none, lesions- none, excoriation- none. +Many tatoos Lymphadenopathy- none Head- atraumatic            Eyes- Gross vision intact, PERRLA, conjunctivae and secretions clear            Ears- Deaf, signing, seems to hear mother            Nose- Clear, no-Septal dev, mucus, polyps, erosion, perforation             Throat- Mallampati II-III , mucosa clear , drainage- none, tonsils- atrophic Neck- flexible , trachea midline, no stridor , thyroid nl, carotid no bruit Chest - symmetrical excursion , unlabored           Heart/CV- RRR , no murmur , no gallop  , no rub, nl s1 s2                           -  JVD- none , edema- none, stasis changes- none, varices- none           Lung- clear to P&A, wheeze- none, cough- none , dullness-none, rub- none           Chest wall-  Abd-  Br/ Gen/ Rectal- Not done, not indicated Extrem- cyanosis- none, clubbing, none, atrophy- none, strength- nl Neuro- grossly intact to observation

## 2015-03-09 ENCOUNTER — Telehealth: Payer: Self-pay | Admitting: Internal Medicine

## 2015-03-09 DIAGNOSIS — J45909 Unspecified asthma, uncomplicated: Secondary | ICD-10-CM | POA: Diagnosis not present

## 2015-03-09 NOTE — Telephone Encounter (Signed)
Pt sent an email to schedule an appt: Follow up appointment with CY to discuss about result from allergy test, to see what next step to take. Mother is free to drive on [id number] in the afternoon. Interpreter is required.

## 2015-03-09 NOTE — Telephone Encounter (Signed)
mychart message sent to patient asking if she feels like she needs to follow up with CY or if she would rather see VM.

## 2015-03-09 NOTE — Telephone Encounter (Signed)
Patient Instructions     The strongest skin test reactions were for tree pollens and house dust.  Consider the dust control information we gave.  You and Dr Dema SeverinMungal can decide if any treatment such as allergy shots is needed, beyond what is being done through his office. I will be happy to see you again if needed.   From 03-04-15. Does patient feel she needs to follow up again with CY?

## 2015-03-09 NOTE — Telephone Encounter (Signed)
CY is only here in the a.m. On the 27th but has no openings on the 28th.  Interpreter will be scheduled with the appt.    Dr. Maple HudsonYoung are you ok with working in this pt on that day to review allergy test?  Thanks!

## 2015-03-13 DIAGNOSIS — J45909 Unspecified asthma, uncomplicated: Secondary | ICD-10-CM | POA: Diagnosis not present

## 2015-03-16 ENCOUNTER — Encounter: Payer: Self-pay | Admitting: Internal Medicine

## 2015-03-16 DIAGNOSIS — J45909 Unspecified asthma, uncomplicated: Secondary | ICD-10-CM | POA: Diagnosis not present

## 2015-03-16 NOTE — Telephone Encounter (Signed)
ATC to schedule appt with patient - uses interpreter line for sign language.  Pt requesting OV - Dr Dema SeverinMungal has an opening on 03/23/15 at 3pm  LMTCB x 1 with interpreter line  [facility] ensure that this patient gets scheduled.

## 2015-03-17 ENCOUNTER — Other Ambulatory Visit: Payer: Self-pay | Admitting: Internal Medicine

## 2015-03-17 DIAGNOSIS — J4530 Mild persistent asthma, uncomplicated: Secondary | ICD-10-CM

## 2015-03-17 DIAGNOSIS — J302 Other seasonal allergic rhinitis: Secondary | ICD-10-CM

## 2015-03-17 NOTE — Progress Notes (Signed)
Referral to ENT Dr. Willeen CassBennett for possible candidate for allergy injections. Message left with voice impaired service at 10:46 am.

## 2015-03-19 NOTE — Assessment & Plan Note (Signed)
Not clear how important atopy is for sustaining her asthma. Probably seasonally and with heavy exposures, her asthma will be harder to control. Little rhinitis, and total IgE not high.  Allergy intervention such as Nucala might make sense if eos high. We emphasized environmental controls and compliance with medication. Allergy vaccine can be tried, but she would need to get it in [place] to make logistics practical for her.

## 2015-03-20 DIAGNOSIS — J45909 Unspecified asthma, uncomplicated: Secondary | ICD-10-CM | POA: Diagnosis not present

## 2015-03-23 DIAGNOSIS — J45909 Unspecified asthma, uncomplicated: Secondary | ICD-10-CM | POA: Diagnosis not present

## 2015-03-25 ENCOUNTER — Telehealth: Payer: Self-pay | Admitting: Internal Medicine

## 2015-03-25 ENCOUNTER — Ambulatory Visit: Payer: Medicare Other | Admitting: Internal Medicine

## 2015-03-25 NOTE — Telephone Encounter (Signed)
Per Dr. Courtney ParisMungal's request 03/25/15 appt will be rescheduled after referral with ENT on 04/06/15. A message was left for the patient and I spoke with her mother. I told her about ENT appt on 04/05/18 @9 :15 with Dr. Andee PolesVaught. The have a interpreter scheduled for the visit.

## 2015-03-30 DIAGNOSIS — M797 Fibromyalgia: Secondary | ICD-10-CM | POA: Diagnosis not present

## 2015-03-30 DIAGNOSIS — N926 Irregular menstruation, unspecified: Secondary | ICD-10-CM | POA: Diagnosis not present

## 2015-03-30 DIAGNOSIS — G43A0 Cyclical vomiting, not intractable: Secondary | ICD-10-CM | POA: Diagnosis not present

## 2015-03-30 DIAGNOSIS — J4540 Moderate persistent asthma, uncomplicated: Secondary | ICD-10-CM | POA: Diagnosis not present

## 2015-03-30 DIAGNOSIS — R202 Paresthesia of skin: Secondary | ICD-10-CM | POA: Diagnosis not present

## 2015-03-30 DIAGNOSIS — R21 Rash and other nonspecific skin eruption: Secondary | ICD-10-CM | POA: Diagnosis not present

## 2015-03-31 ENCOUNTER — Encounter: Payer: Self-pay | Admitting: Internal Medicine

## 2015-03-31 ENCOUNTER — Other Ambulatory Visit: Payer: Self-pay | Admitting: Family Medicine

## 2015-03-31 DIAGNOSIS — R1115 Cyclical vomiting syndrome unrelated to migraine: Secondary | ICD-10-CM

## 2015-04-01 ENCOUNTER — Encounter: Payer: Medicare Other | Attending: Internal Medicine

## 2015-04-01 DIAGNOSIS — J45909 Unspecified asthma, uncomplicated: Secondary | ICD-10-CM | POA: Insufficient documentation

## 2015-04-03 ENCOUNTER — Encounter: Payer: Medicare Other | Admitting: *Deleted

## 2015-04-03 DIAGNOSIS — J45909 Unspecified asthma, uncomplicated: Secondary | ICD-10-CM | POA: Diagnosis not present

## 2015-04-03 NOTE — Progress Notes (Signed)
Daily Session Note  Patient Details  Name: MERYEM HAERTEL MRN: [id number] Date of Birth: Nov 29, 1985 Referring Provider:  Vilinda Boehringer, MD  Encounter Date: 04/03/2015  Check In:     Session Check In - 04/03/15 1224    Check-In   Staff Present Candiss Norse MS, ACSM CEP Exercise Physiologist;Stacey Blanch Media RRT, RCP Respiratory Therapist;Carroll Enterkin RN, BSN   ER physicians immediately available to respond to emergencies [facility] immediately available ER MD   Physician(s) Karma Greaser and Lorelei Pont and Cool-down Performed on first and last piece of equipment   VAD Patient? No   Pain Assessment   Currently in Pain? No/denies   Multiple Pain Sites No           Exercise Prescription Changes - 04/03/15 1200    Exercise Review   Progression Yes   Arm Ergometer   Level 1   Watts 11   Minutes 10      Goals Met:  Proper associated with RPD/PD & O2 Sat Independence with exercise equipment Using PLB without cueing & demonstrates good technique Exercise tolerated well Personal goals reviewed  Goals Unmet:  Not Applicable  Goals Comments: Paolina added a new piece of equipment : arm ergometer. She was oriented to the equipment and verbalized understanding of how to use it and will add this to her regular exercise routine in rehab.    Dr. Emily Filbert is Medical Director for [place] and [facility].

## 2015-04-06 DIAGNOSIS — J45909 Unspecified asthma, uncomplicated: Secondary | ICD-10-CM | POA: Diagnosis not present

## 2015-04-06 DIAGNOSIS — J301 Allergic rhinitis due to pollen: Secondary | ICD-10-CM | POA: Diagnosis not present

## 2015-04-07 ENCOUNTER — Ambulatory Visit
Admission: RE | Admit: 2015-04-07 | Discharge: 2015-04-07 | Disposition: A | Discharge status: Home / Self Care | Payer: Medicare Other | Source: Ambulatory Visit | Attending: Family Medicine | Admitting: Family Medicine

## 2015-04-07 DIAGNOSIS — R112 Nausea with vomiting, unspecified: Secondary | ICD-10-CM | POA: Diagnosis not present

## 2015-04-07 DIAGNOSIS — K760 Fatty (change of) liver, not elsewhere classified: Secondary | ICD-10-CM | POA: Insufficient documentation

## 2015-04-07 DIAGNOSIS — K769 Liver disease, unspecified: Secondary | ICD-10-CM | POA: Diagnosis not present

## 2015-04-07 DIAGNOSIS — R1115 Cyclical vomiting syndrome unrelated to migraine: Secondary | ICD-10-CM

## 2015-04-07 DIAGNOSIS — R10811 Right upper quadrant abdominal tenderness: Secondary | ICD-10-CM | POA: Diagnosis not present

## 2015-04-09 ENCOUNTER — Telehealth: Payer: Self-pay | Admitting: Internal Medicine

## 2015-04-09 NOTE — Telephone Encounter (Signed)
Return call will be there til [phone].Caren GriffinsStanley A Dalton

## 2015-04-09 NOTE — Telephone Encounter (Signed)
Called and they are now closed. WCB in AM 

## 2015-04-09 NOTE — Telephone Encounter (Signed)
lmomtcb x1 

## 2015-04-10 ENCOUNTER — Encounter: Payer: Medicare Other | Admitting: *Deleted

## 2015-04-10 DIAGNOSIS — J45909 Unspecified asthma, uncomplicated: Secondary | ICD-10-CM

## 2015-04-10 NOTE — Telephone Encounter (Signed)
If you have Dr Vaught's fax number, I will fax a copy of our allergy skin test results and My office note.  You can let Dr Irish EldersV's office know this will be coming.

## 2015-04-10 NOTE — Progress Notes (Signed)
Daily Session Note  Patient Details  Name: Sarayah J Stark MRN: [id number] Date of Birth: 12/01/1985 Referring Provider:  Sowles, Krichna, MD  Encounter Date: 04/10/2015  Check In:     Session Check In - 04/10/15 1149    Check-In   Staff Present   RN, BSN;Stacey Joyce RRT, RCP Respiratory Therapist;Renee MacMillan MS, ACSM CEP Exercise Physiologist   ER physicians immediately available to respond to emergencies [facility] immediately available ER MD   Physician(s) Dr. Paduchowski and Dr. Quale   Medication changes reported     No   Fall or balance concerns reported    No   Warm-up and Cool-down Performed on first and last piece of equipment   VAD Patient? No   Pain Assessment   Currently in Pain? No/denies         Goals Met:  Proper associated with RPD/PD & O2 Sat Exercise tolerated well  Goals Unmet:  Not Applicable  Goals Comments:   Dr. Mark Miller is Medical Director for [facility] and [facility]. 

## 2015-04-10 NOTE — Telephone Encounter (Signed)
Dr. Maple HudsonYoung, Dr. Dema SeverinMungal referred Yesenia Wood for allergy testing. He then [facility] Dr. Andee PolesVaught needs interpretations of allergy testing. He wants to get patient on immunotherapy based on your results. Please advise.

## 2015-04-10 NOTE — Telephone Encounter (Signed)
I did fax a copy of allergy skin results. [facility] am [facility] if [facility] but I will do this right now.

## 2015-04-13 ENCOUNTER — Encounter: Payer: Medicare Other | Admitting: *Deleted

## 2015-04-13 DIAGNOSIS — J45909 Unspecified asthma, uncomplicated: Secondary | ICD-10-CM | POA: Diagnosis not present

## 2015-04-13 NOTE — Telephone Encounter (Signed)
Yesenia Wood can this message be closed? Thanks!

## 2015-04-13 NOTE — Progress Notes (Signed)
Daily Session Note  Patient Details  Name: Yesenia Wood MRN: [id number] Date of Birth: [birth year] Referring Provider:  Steele Sizer, MD  Encounter Date: 04/13/2015  Check In:     Session Check In - 04/13/15 1258    Check-In   Staff Present Carson Myrtle BS, RRT, Respiratory Therapist;Carletha Dawn RN, BSN;Steven Way BS, ACSM EP-C, Exercise Physiologist   ER physicians immediately available to respond to emergencies [facility] immediately available ER MD   Physician(s) Dr. Karma Greaser, Dr. Corky Downs   Medication changes reported     No   Warm-up and Cool-down Performed on first and last piece of equipment   VAD Patient? No   Pain Assessment   Currently in Pain? Other (Comment)  No change in Chronic pain in her legs so she decreases her workload levels         Goals Met:  Proper associated with RPD/PD & O2 Sat  Goals Unmet:  RPE  Goals Comments: Will decrease her workload frequently for her pain in her joints.    Dr. Emily Filbert is Medical Director for [place] and [facility].

## 2015-04-15 ENCOUNTER — Telehealth: Payer: Self-pay | Admitting: Internal Medicine

## 2015-04-15 NOTE — Telephone Encounter (Signed)
Per CY-Eastern 8 Tree mix(includes Elm,maple,etc); Dust Mites: Farinae and Pteronyssinus; Weeds: Ragweed,plantain; would include 7 Southern Grass.

## 2015-04-15 NOTE — Telephone Encounter (Signed)
Left detailed message at Krissy's request.  Nothing further needed at this time.

## 2015-04-15 NOTE — Telephone Encounter (Signed)
lmtcb X1 for [employer] at [facility]

## 2015-04-15 NOTE — Telephone Encounter (Signed)
Benetta SparKrissy from [facility] returned call - [phone]

## 2015-04-15 NOTE — Telephone Encounter (Signed)
I left note on Katie's desk

## 2015-04-15 NOTE — Telephone Encounter (Signed)
Spoke with [employer]rissy. She wants to make sure which components the pt is allergic to. On the allergy testing sheet that was sent to her it looks like the pt is allergic to E 8 Tree Mix, Elm, Mite D Farinae and Mite D Pteron.  CY - can you clarify this for her? Thanks.

## 2015-04-15 NOTE — Telephone Encounter (Signed)
lmtcb x1 for [employer]riss[employer].

## 2015-04-15 NOTE — Telephone Encounter (Signed)
Benetta SparKrissy returning call - states to leave her a vm of what she is allergic to - [phone] ext [phone] or [phone]

## 2015-04-17 ENCOUNTER — Encounter: Payer: Medicare Other | Admitting: Respiratory Therapy

## 2015-04-17 DIAGNOSIS — J301 Allergic rhinitis due to pollen: Secondary | ICD-10-CM | POA: Diagnosis not present

## 2015-04-17 DIAGNOSIS — J45909 Unspecified asthma, uncomplicated: Secondary | ICD-10-CM

## 2015-04-17 NOTE — Progress Notes (Signed)
Daily Session Note  Patient Details  Name: ADISON REIFSTECK MRN: [id number] Date of Birth: Sep 17, 1986 Referring Provider:  Steele Sizer, MD  Encounter Date: 04/17/2015  Check In:     Session Check In - 04/17/15 1236    Check-In   Staff Present Gerlene Burdock RN, BSN;Cadence Haslam Blanch Media RRT, RCP Respiratory Therapist;Renee Dillard Essex MS, ACSM CEP Exercise Physiologist   ER physicians immediately available to respond to emergencies [facility] immediately available ER MD   Physician(s) Dr. Reita Cliche and Dr. Corky Downs   Medication changes reported     No   Fall or balance concerns reported    No   Warm-up and Cool-down Performed on first and last piece of equipment   VAD Patient? No   Pain Assessment   Currently in Pain? Yes   Pain Score 4    Pain Location Knee   Pain Orientation Right;Left   Pain Descriptors / Indicators Constant   Pain Type Chronic pain   Pain Onset 1 to 4 weeks ago   Effect of Pain on Daily Activities Ms. Merlino modified complained of continued knee pain, but completed all 3 pieces of equipment and resistive training   Multiple Pain Sites No         Goals Met:  Proper associated with RPD/PD & O2 Sat Independence with exercise equipment Exercise tolerated well  Goals Unmet:  Not Applicable  Goals Comments:    Dr. Emily Filbert is Medical Director for [place] and [facility].

## 2015-04-20 ENCOUNTER — Encounter: Payer: Medicare Other | Admitting: *Deleted

## 2015-04-20 DIAGNOSIS — J301 Allergic rhinitis due to pollen: Secondary | ICD-10-CM | POA: Diagnosis not present

## 2015-04-20 DIAGNOSIS — J45909 Unspecified asthma, uncomplicated: Secondary | ICD-10-CM

## 2015-04-20 NOTE — Progress Notes (Signed)
Daily Session Note  Patient Details  Name: Yesenia Wood MRN: [id number] Date of Birth: 03/13/1986 Referring Provider:  Sowles, Krichna, MD  Encounter Date: 04/20/2015  Check In:     Session Check In - 04/20/15 1237    Check-In   Staff Present Jamie Athas MPH, CHES;Susanne Bice RN, BSN, CCRP;Laureen Brown BS, RRT, Respiratory Therapist;Carroll Enterkin RN, BSN   ER physicians immediately available to respond to emergencies [facility] immediately available ER MD   Physician(s) Dr. Kinner Dr. Lord   Medication changes reported     No   Fall or balance concerns reported    No   Warm-up and Cool-down Performed on first and last piece of equipment   VAD Patient? No   Pain Assessment   Currently in Pain? Other (Comment)  No change in chronic pain           Exercise Prescription Changes - 04/20/15 1300    Exercise Review   Progression Yes   Response to Exercise   Blood Pressure (Admit) 110/78 mmHg   Blood Pressure (Exercise) 132/80 mmHg   Blood Pressure (Exit) 128/84 mmHg   Heart Rate (Admit) 109 bpm   Heart Rate (Exercise) 116 bpm   Heart Rate (Exit) 97 bpm   Oxygen Saturation (Admit) 97 %   Oxygen Saturation (Exercise) 98 %   Oxygen Saturation (Exit) 97 %   Rating of Perceived Exertion (Exercise) 15   Perceived Dyspnea (Exercise) 4   Intensity THRR unchanged   Progression Continue progressive overload as per policy without signs/symptoms or physical distress.   Resistance Training   Training Prescription Yes   Weight 2   Reps 10-12   Treadmill   MPH 1.7   Grade 0   Minutes 15   NuStep   Level 3   Watts 40   Minutes 15   Arm Ergometer   Level 1   Watts 11   Minutes 10   REL-XR   Level 3   Watts 45   Minutes 15      Goals Met:  Independence with exercise equipment Exercise tolerated well Strength training completed today  Goals Unmet:  Not Applicable  Goals Comments: 30 day review    Dr. Mark Miller is Medical Director for [facility] and [facility]. 

## 2015-04-20 NOTE — Progress Notes (Signed)
Daily Session Note  Patient Details  Name: NARIYA NEUMEYER MRN: [id number] Date of Birth: July 26, 1986 Referring Provider:  Steele Sizer, MD  Encounter Date: 04/20/2015  Check In:     Session Check In - 04/20/15 1237    Check-In   Staff Present Frederich Balding MPH, CHES;Susanne Bice RN, BSN, CCRP;Laureen Owens Shark BS, RRT, Respiratory Therapist;Carroll Enterkin RN, BSN   ER physicians immediately available to respond to emergencies [facility] immediately available ER MD   Physician(s) Dr. Corky Downs Dr. Reita Cliche   Medication changes reported     No   Fall or balance concerns reported    No   Warm-up and Cool-down Performed on first and last piece of equipment   VAD Patient? No   Pain Assessment   Currently in Pain? Other (Comment)  No change in chronic pain           Exercise Prescription Changes - 04/20/15 1300    Exercise Review   Progression Yes   Response to Exercise   Blood Pressure (Admit) 110/78 mmHg   Blood Pressure (Exercise) 132/80 mmHg   Blood Pressure (Exit) 128/84 mmHg   Heart Rate (Admit) 109 bpm   Heart Rate (Exercise) 116 bpm   Heart Rate (Exit) 97 bpm   Oxygen Saturation (Admit) 97 %   Oxygen Saturation (Exercise) 98 %   Oxygen Saturation (Exit) 97 %   Rating of Perceived Exertion (Exercise) 15   Perceived Dyspnea (Exercise) 4   Intensity THRR unchanged   Progression Continue progressive overload as per policy without signs/symptoms or physical distress.   Resistance Training   Training Prescription Yes   Weight 2   Reps 10-12   Treadmill   MPH 1.7   Grade 0   Minutes 15   NuStep   Level 3   Watts 40   Minutes 15   Arm Ergometer   Level 1   Watts 11   Minutes 10   REL-XR   Level 3   Watts 45   Minutes 15      Goals Met:  Proper associated with RPD/PD & O2 Sat Independence with exercise equipment Exercise tolerated well Strength training completed today  Goals Unmet:  Not Applicable  Goals Comments: Ms Bunney has started getting allergy  shots twice a week in both arms for 4-55mos; then once a week for 3-521yr she states she is allergic to dust mites, trees, grass, and ragweed.   Dr. MaEmily Filberts Medical Director for [facility].

## 2015-04-20 NOTE — Progress Notes (Signed)
Pulmonary Individual Treatment Plan  Patient Details  Name: Yesenia Wood MRN: [id number] Date of Birth: [birth year] Referring Provider:  Alba Cory, MD  Initial Encounter Date:    Visit Diagnosis: Intrinsic asthma, unspecified asthma severity, uncomplicated  Patient's Home Medications on Admission:  Current outpatient prescriptions:  .  albuterol (PROVENTIL HFA;VENTOLIN HFA) 108 (90 BASE) MCG/ACT inhaler, Inhale 2 puffs into the lungs every 4 (four) hours as needed for wheezing or shortness of breath., Disp: , Rfl:  .  Ascorbic Acid (VITAMIN C) 1000 MG tablet, Take 1,000 mg by mouth daily., Disp: , Rfl:  .  Cyanocobalamin (B-12) 1000 MCG SUBL, Place 1 tablet under the tongue daily., Disp: , Rfl:  .  cyclobenzaprine (FLEXERIL) 10 MG tablet, Take 10 mg by mouth daily as needed for muscle spasms., Disp: , Rfl:  .  drospirenone-ethinyl estradiol (YAZ,GIANVI,LORYNA) 3-0.02 MG tablet, Take 1 tablet by mouth daily., Disp: , Rfl:  .  ferrous sulfate 325 (65 FE) MG tablet, Take 1 tablet by mouth daily., Disp: , Rfl:  .  Fluticasone-Salmeterol (ADVAIR DISKUS) 500-50 MCG/DOSE AEPB, Inhale 1 puff into the lungs 2 (two) times daily., Disp: 60 each, Rfl: 5 .  Multiple Vitamin (MULTI-VITAMINS) TABS, Take 1 tablet by mouth daily as needed., Disp: , Rfl:  .  predniSONE (DELTASONE) 5 MG tablet, Take 1 tablet (5 mg total) by mouth daily with breakfast., Disp: 30 tablet, Rfl: 1 .  traMADol (ULTRAM) 50 MG tablet, Take 50 mg by mouth every 8 (eight) hours as needed., Disp: , Rfl:  .  Turmeric 450 MG CAPS, Take 1 capsule by mouth daily., Disp: , Rfl:   Past Medical History: Past Medical History  Diagnosis Date  . Hyperglycemia   . Obesity   . GERD (gastroesophageal reflux disease)   . Fibromyalgia   . Paresthesia   . Fatigue   . B12 deficiency   . Moderate asthma   . Bell's palsy     right sided  . Headache   . Deaf   . Premature birth     Tobacco Use: History  Smoking status  . Former  Smoker -- 1.00 packs/day for 2 years  . Types: Cigarettes  Smokeless tobacco  . Never Used    Labs: Recent Review Flowsheet Data    There is no flowsheet data to display.       ADL UCSD:    Pulmonary Function Assessment:   Exercise Target Goals:    Exercise Program Goal: Individual exercise prescription set with THRR, safety & activity barriers. Participant demonstrates ability to understand and report RPE using BORG scale, to self-measure pulse accurately, and to acknowledge the importance of the exercise prescription.  Exercise Prescription Goal: Starting with aerobic activity 30 plus minutes a day, 3 days per week for initial exercise prescription. Provide home exercise prescription and guidelines that participant acknowledges understanding prior to discharge.  Activity Barriers & Risk Stratification:   6 Minute Walk:   Initial Exercise Prescription:   Exercise Prescription Changes:     Exercise Prescription Changes      04/03/15 1200 04/20/15 1000 04/20/15 1300       Exercise Review   Progression Yes Yes Yes     Response to Exercise   Blood Pressure (Admit)  110/78 mmHg 110/78 mmHg     Blood Pressure (Exercise)  132/80 mmHg 132/80 mmHg     Blood Pressure (Exit)  128/84 mmHg 128/84 mmHg     Heart Rate (Admit)   109 bpm  Heart Rate (Exercise)   116 bpm     Heart Rate (Exit)   97 bpm     Oxygen Saturation (Admit)   97 %     Oxygen Saturation (Exercise)   98 %     Oxygen Saturation (Exit)   97 %     Rating of Perceived Exertion (Exercise)   15     Perceived Dyspnea (Exercise)   4     Intensity  THRR unchanged THRR unchanged     Progression  Continue progressive overload as per policy without signs/symptoms or physical distress. Continue progressive overload as per policy without signs/symptoms or physical distress.     Resistance Training   Training Prescription  Yes Yes     Weight  2 2     Reps  10-12 10-12     Treadmill   MPH  1.7 1.7     Grade   0 0     Minutes  15 15     NuStep   Level  3 3     Watts  40 40     Minutes  15 15     Arm Ergometer   Level 1  1     Watts 11  11     Minutes 10  10     REL-XR   Level  3 3     Watts  45 45     Minutes  15 15        Discharge Exercise Prescription:    Nutrition:  Target Goals: Understanding of nutrition guidelines, daily intake of sodium 1500mg , cholesterol 200mg , calories 30% from fat and 7% or less from saturated fats, daily to have 5 or more servings of fruits and vegetables.  Biometrics:    Nutrition Therapy Plan and Nutrition Goals:   Nutrition Discharge: Rate Your Plate Scores:   Psychosocial: Target Goals: Acknowledge presence or absence of depression, maximize coping skills, provide positive support system. Participant is able to verbalize types and ability to use techniques and skills needed for reducing stress and depression.  Initial Review & Psychosocial Screening:   Quality of Life Scores:   PHQ-9:     Recent Review Flowsheet Data    There is no flowsheet data to display.      Psychosocial Evaluation and Intervention:   Psychosocial Re-Evaluation:  Education: Education Goals: Education classes will be provided on a weekly basis, covering required topics. Participant will state understanding/return demonstration of topics presented.  Learning Barriers/Preferences:   Education Topics: Initial Evaluation Education: - Verbal, written and demonstration of respiratory meds, RPE/PD scales, oximetry and breathing techniques. Instruction on use of nebulizers and MDIs: cleaning and proper use, rinsing mouth with steroid doses and importance of monitoring MDI activations.   General Nutrition Guidelines/Fats and Fiber: -Group instruction provided by verbal, written material, models and posters to present the general guidelines for heart healthy nutrition. Gives an explanation and review of dietary fats and fiber.   Controlling Sodium/Reading  Food Labels: -Group verbal and written material supporting the discussion of sodium use in heart healthy nutrition. Review and explanation with models, verbal and written materials for utilization of the food label.   Exercise Physiology & Risk Factors: - Group verbal and written instruction with models to review the exercise physiology of the cardiovascular system and associated critical values. Details cardiovascular disease risk factors and the goals associated with each risk factor.   Aerobic Exercise & Resistance Training: - Gives group verbal and written discussion  on the health impact of inactivity. On the components of aerobic and resistive training programs and the benefits of this training and how to safely progress through these programs.   Flexibility, Balance, General Exercise Guidelines: - Provides group verbal and written instruction on the benefits of flexibility and balance training programs. Provides general exercise guidelines with specific guidelines to those with heart or lung disease. Demonstration and skill practice provided.   Stress Management: - Provides group verbal and written instruction about the health risks of elevated stress, cause of high stress, and healthy ways to reduce stress.   Depression: - Provides group verbal and written instruction on the correlation between heart/lung disease and depressed mood, treatment options, and the stigmas associated with seeking treatment.   Exercise & Equipment Safety: - Individual verbal instruction and demonstration of equipment use and safety with use of the equipment.   Infection Prevention: - Provides verbal and written material to individual with discussion of infection control including proper hand washing and proper equipment cleaning during exercise session.   Falls Prevention: - Provides verbal and written material to individual with discussion of falls prevention and safety.   Diabetes: - Individual  verbal and written instruction to review signs/symptoms of diabetes, desired ranges of glucose level fasting, after meals and with exercise. Advice that pre and post exercise glucose checks will be done for 3 sessions at entry of program.   Chronic Lung Diseases: - Group verbal and written instruction to review new updates, new respiratory medications, new advancements in procedures and treatments. Provide informative websites and "800" numbers of self-education.   Lung Procedures: - Group verbal and written instruction to describe testing methods done to diagnose lung disease. Review the outcome of test results. Describe the treatment choices: Pulmonary Function Tests, ABGs and oximetry.   Energy Conservation: - Provide group verbal and written instruction for methods to conserve energy, plan and organize activities. Instruct on pacing techniques, use of adaptive equipment and posture/positioning to relieve shortness of breath.   Triggers: - Group verbal and written instruction to review types of environmental controls: home humidity, furnaces, filters, dust mite/pet prevention, HEPA vacuums. To discuss weather changes, air quality and the benefits of nasal washing.   Exacerbations: - Group verbal and written instruction to provide: warning signs, infection symptoms, calling MD promptly, preventive modes, and value of vaccinations. Review: effective airway clearance, coughing and/or vibration techniques. Create an Sport and exercise psychologist.   Oxygen: - Individual and group verbal and written instruction on oxygen therapy. Includes supplement oxygen, available portable oxygen systems, continuous and intermittent flow rates, oxygen safety, concentrators, and Medicare reimbursement for oxygen.   Respiratory Medications: - Group verbal and written instruction to review medications for lung disease. Drug class, frequency, complications, importance of spacers, rinsing mouth after steroid MDI's, and proper  cleaning methods for nebulizers.   AED/CPR: - Group verbal and written instruction with the use of models to demonstrate the basic use of the AED with the basic ABC's of resuscitation.   Breathing Retraining: - Provides individuals verbal and written instruction on purpose, frequency, and proper technique of diaphragmatic breathing and pursed-lipped breathing. Applies individual practice skills.   Anatomy and Physiology of the Lungs: - Group verbal and written instruction with the use of models to provide basic lung anatomy and physiology related to function, structure and complications of lung disease.   Heart Failure: - Group verbal and written instruction on the basics of heart failure: signs/symptoms, treatments, explanation of ejection fraction, enlarged heart and cardiomyopathy.  Sleep Apnea: - Individual verbal and written instruction to review Obstructive Sleep Apnea. Review of risk factors, methods for diagnosing and types of masks and machines for OSA.   Anxiety: - Provides group, verbal and written instruction on the correlation between heart/lung disease and anxiety, treatment options, and management of anxiety.   Relaxation: - Provides group, verbal and written instruction about the benefits of relaxation for patients with heart/lung disease. Also provides patients with examples of relaxation techniques.   Knowledge Questionnaire Score:   Personal Goals and Risk Factors at Admission:   Personal Goals and Risk Factors Review:    Personal Goals Discharge:    Comments: 30 day review

## 2015-04-20 NOTE — Progress Notes (Signed)
Daily Session Note  Patient Details  Name: Yesenia Wood MRN: [id number] Date of Birth: 04/03/1986 Referring Provider:  Sowles, Krichna, MD  Encounter Date: 04/20/2015  Check In:     Session Check In - 04/20/15 1237    Check-In   Staff Present Jamie Athas MPH, CHES;Susanne Bice RN, BSN, CCRP;Laureen Brown BS, RRT, Respiratory Therapist;  RN, BSN   ER physicians immediately available to respond to emergencies [facility] immediately available ER MD   Physician(s) Dr. Kinner Dr. Lord   Medication changes reported     No   Fall or balance concerns reported    No   Warm-up and Cool-down Performed on first and last piece of equipment   VAD Patient? No   Pain Assessment   Currently in Pain? Other (Comment)  No change in chronic pain           Exercise Prescription Changes - 04/20/15 1000    Exercise Review   Progression Yes   Response to Exercise   Blood Pressure (Admit) 110/78 mmHg   Blood Pressure (Exercise) 132/80 mmHg   Blood Pressure (Exit) 128/84 mmHg   Intensity THRR unchanged   Progression Continue progressive overload as per policy without signs/symptoms or physical distress.   Resistance Training   Training Prescription Yes   Weight 2   Reps 10-12   Treadmill   MPH 1.7   Grade 0   Minutes 15   NuStep   Level 3   Watts 40   Minutes 15   REL-XR   Level 3   Watts 45   Minutes 15      Goals Met:  Proper associated with RPD/PD & O2 Sat Exercise tolerated well  Goals Unmet:  Not Applicable  Goals Comments:   Dr. Mark Miller is Medical Director for [facility] and [facility]. 

## 2015-04-23 ENCOUNTER — Encounter: Payer: Self-pay | Admitting: Internal Medicine

## 2015-04-23 DIAGNOSIS — J301 Allergic rhinitis due to pollen: Secondary | ICD-10-CM | POA: Diagnosis not present

## 2015-04-23 DIAGNOSIS — R200 Anesthesia of skin: Secondary | ICD-10-CM | POA: Diagnosis not present

## 2015-04-23 DIAGNOSIS — M79601 Pain in right arm: Secondary | ICD-10-CM | POA: Diagnosis not present

## 2015-04-23 DIAGNOSIS — R531 Weakness: Secondary | ICD-10-CM | POA: Diagnosis not present

## 2015-04-23 DIAGNOSIS — M797 Fibromyalgia: Secondary | ICD-10-CM | POA: Diagnosis not present

## 2015-04-24 ENCOUNTER — Encounter: Payer: Medicare Other | Admitting: *Deleted

## 2015-04-24 DIAGNOSIS — J45909 Unspecified asthma, uncomplicated: Secondary | ICD-10-CM | POA: Diagnosis not present

## 2015-04-24 NOTE — Progress Notes (Signed)
Daily Session Note  Patient Details  Name: MANDI MATTIOLI MRN: [id number] Date of Birth: 06-Mar-1986 Referring Provider:  Steele Sizer, MD  Encounter Date: 04/24/2015  Check In:     Session Check In - 04/24/15 1144    Check-In   Staff Present Gerlene Burdock RN, BSN;Rexton Greulich Dillard Essex MS, ACSM CEP Exercise Physiologist;Stacey Blanch Media RRT, RCP Respiratory Therapist   ER physicians immediately available to respond to emergencies See telemetry face sheet for immediately available ER MD   Medication changes reported     No   Fall or balance concerns reported    No   Warm-up and Cool-down Performed on first and last piece of equipment   VAD Patient? No   Pain Assessment   Currently in Pain? No/denies   Multiple Pain Sites No         Goals Met:  Proper associated with RPD/PD & O2 Sat Independence with exercise equipment Using PLB without cueing & demonstrates good technique Exercise tolerated well Strength training completed today  Goals Unmet:  Not Applicable  Goals Comments:   Dr. Emily Filbert is Medical Director for [place] and [facility].

## 2015-04-28 NOTE — Patient Instructions (Signed)
pul Patient Instructions  Patient Details  Name: Yesenia Wood MRN: [id number] Date of Birth: 03/23/1986 Referring Provider:  Alba CorySowles, Krichna, MD  Below are the personal goals you chose as well as exercise and nutrition goals. Our goal is to help you keep on track towards obtaining and maintaining your goals. We will be discussing your progress on these goals with you throughout the program.  Initial Exercise Prescription:   Exercise Goals: Frequency: Be able to perform aerobic exercise three times per week working toward 3-5 days per week.  Intensity: Work with a perceived exertion of 11 (fairly light) - 15 (hard) as tolerated. Follow your new exercise prescription and watch for changes in prescription as you progress with the program. Changes will be reviewed with you when they are made.  Duration: You should be able to do 30 minutes of continuous aerobic exercise in addition to a 5 minute warm-up and a 5 minute cool-down routine.  Nutrition Goals: Your personal nutrition goals will be established when you do your nutrition analysis with the dietician.  The following are nutrition guidelines to follow: Cholesterol < 200mg /day Sodium < 1500mg /day Fiber: Women under 50 yrs - 25 grams per day  Personal Goals:     Personal Goals and Risk Factors at Admission - 04/28/15 1415    Personal Goals and Risk Factors on Admission    Weight Management Yes   Intervention Learn and follow the exercise and diet guidelines while in the program. Utilize the nutrition and education classes to help gain knowledge of the diet and exercise expectations in the program   Admit Weight 175 lb 3.2 oz (79.47 kg)   Goal Weight 155 lb (70.308 kg)   Increase Aerobic Exercise and Physical Activity Yes   Intervention While in program, learn and follow the exercise prescription taught. Start at a low level workload and increase workload after able to maintain previous level for 30 minutes. Increase time before  increasing intensity.   Understand more about Heart/Pulmonary Disease. Yes   Intervention While in program utilize professionals for any questions, and attend the education sessions. Great websites to use are www.americanheart.org or www.lung.org for reliable information.   Improve shortness of breath with ADL's Yes   Intervention While in program, learn and follow the exercise prescription taught. Start at a low level workload and increase workload ad advised by the exercise physiologist. Increase time before increasing intensity.   Develop more efficient breathing techniques such as purse lipped breathing and diaphragmatic breathing; and practicing self-pacing with activity Yes   Intervention While in program, learn and utilize the specific breathing techniques taught to you. Continue to practice and use the techniques as needed.   Increase knowledge of respiratory medications and ability to use respiratory devices properly.  Yes   Intervention While in program, learn to administer MDI, nebulizer, and spacer properly.;Learn to take respiratory medicine as ordered.;While in program, learn to Clean MDI, nebulizers, and spacers properly.      Tobacco Use Initial Evaluation: History  Smoking status   Former Smoker -- 1.00 packs/day for 2 years   Types: Cigarettes  Smokeless tobacco   Never Used    Copy of goals given to participant.

## 2015-04-29 ENCOUNTER — Encounter: Payer: Medicare Other | Attending: Internal Medicine

## 2015-04-29 DIAGNOSIS — J45909 Unspecified asthma, uncomplicated: Secondary | ICD-10-CM | POA: Insufficient documentation

## 2015-04-30 DIAGNOSIS — J301 Allergic rhinitis due to pollen: Secondary | ICD-10-CM | POA: Diagnosis not present

## 2015-05-01 ENCOUNTER — Encounter: Payer: Medicare Other | Admitting: *Deleted

## 2015-05-01 DIAGNOSIS — J45909 Unspecified asthma, uncomplicated: Secondary | ICD-10-CM

## 2015-05-01 NOTE — Progress Notes (Signed)
Daily Session Note  Patient Details  Name: SHINA WASS MRN: [id number] Date of Birth: 1986/04/14 Referring Provider:  Vilinda Boehringer, MD  Encounter Date: 05/01/2015  Check In:     Session Check In - 05/01/15 1228    Check-In   Staff Present Gerlene Burdock RN, BSN;Renee Dillard Essex MS, ACSM CEP Exercise Physiologist;Laureen Janell Quiet, RRT, Respiratory Therapist   ER physicians immediately available to respond to emergencies [facility] immediately available ER MD   Physician(s) Kerman Passey and Thomasene Lot   Medication changes reported     Yes   Comments Noted in medications   Fall or balance concerns reported    No   Warm-up and Cool-down Performed on first and last piece of equipment   VAD Patient? No   Pain Assessment   Currently in Pain? Yes   Pain Score 5    Multiple Pain Sites No         Goals Met:  Proper associated with RPD/PD & O2 Sat Independence with exercise equipment Exercise tolerated well Strength training completed today  Goals Unmet:  Not Applicable  Goals Comments: Due to fibromyalaga pain, Ms Twombly has modified her exercise goals. She has started on a new drug, Notryolline, so hopefully this will help with her pain.   Dr. Emily Filbert is Medical Director for [place] and [facility].

## 2015-05-01 NOTE — Progress Notes (Signed)
Daily Session Note  Patient Details  Name: Libertie J Gethers MRN: [id number] Date of Birth: 05/27/1986 Referring Provider:  Mungal, Vishal, MD  Encounter Date: 05/01/2015  Check In:     Session Check In - 05/01/15 1228    Check-In   Staff Present Carroll Enterkin RN, BSN;  MS, ACSM CEP Exercise Physiologist;Laureen Brown BS, RRT, Respiratory Therapist   ER physicians immediately available to respond to emergencies [facility] immediately available ER MD   Physician(s) Paduchowski and Kaminski   Medication changes reported     No   Fall or balance concerns reported    No   Warm-up and Cool-down Performed on first and last piece of equipment   VAD Patient? No   Pain Assessment   Currently in Pain? No/denies   Multiple Pain Sites No         Goals Met:  Proper associated with RPD/PD & O2 Sat Independence with exercise equipment Using PLB without cueing & demonstrates good technique Personal goals reviewed Strength training completed today  Goals Unmet:  Not Applicable  Goals Comments:   Dr. Mark Miller is Medical Director for [facility] and [facility]. 

## 2015-05-04 ENCOUNTER — Encounter: Payer: Medicare Other | Admitting: *Deleted

## 2015-05-04 DIAGNOSIS — J301 Allergic rhinitis due to pollen: Secondary | ICD-10-CM | POA: Diagnosis not present

## 2015-05-04 DIAGNOSIS — J45909 Unspecified asthma, uncomplicated: Secondary | ICD-10-CM | POA: Diagnosis not present

## 2015-05-04 NOTE — Progress Notes (Signed)
Daily Session Note  Patient Details  Name: Yesenia Wood MRN: [id number] Date of Birth: 01/14/1986 Referring Provider:  Steele Sizer, MD  Encounter Date: 05/04/2015  Check In:     Session Check In - 05/04/15 1154    Check-In   Staff Present Carson Myrtle BS, RRT, Respiratory Therapist;Daishia Fetterly RN, BSN;Renee Dillard Essex MS, ACSM CEP Exercise Physiologist   ER physicians immediately available to respond to emergencies [facility] immediately available ER MD   Physician(s) Dr. Jacqualine Code and Dr. Corky Downs   Medication changes reported     No   Fall or balance concerns reported    No   Warm-up and Cool-down Performed on first and last piece of equipment   VAD Patient? No   Pain Assessment   Currently in Pain? No/denies         Goals Met:  Proper associated with RPD/PD & O2 Sat Exercise tolerated well  Goals Unmet:  Not Applicable  Goals Comments:    Dr. Emily Filbert is Medical Director for [place] and [facility].

## 2015-05-04 NOTE — Progress Notes (Signed)
Daily Session Note  Patient Details  Name: Yesenia Wood MRN: [id number] Date of Birth: 11/21/1986 Referring Provider:  Steele Sizer, MD  Encounter Date: 05/04/2015  Check In:     Session Check In - 05/04/15 1154    Check-In   Staff Present Yesenia Wood BS, RRT, Respiratory Therapist;Yesenia Enterkin RN, BSN;Yesenia Dillard Essex Yesenia, ACSM CEP Exercise Physiologist   ER physicians immediately available to respond to emergencies [facility] immediately available ER MD   Physician(s) Dr. Jacqualine Wood and Dr. Corky Wood   Medication changes reported     No   Fall or balance concerns reported    No   Warm-up and Cool-down Performed on first and last piece of equipment   VAD Patient? No   Pain Assessment   Currently in Pain? No/denies         Goals Met:  Proper associated with RPD/PD & O2 Sat Independence with exercise equipment Personal goals reviewed  Goals Unmet:  Not Applicable  Goals Comments: Yesenia Wood talked about getting an exercise bike for home. She does not feel she is progressing in [facility], but due to pain, she has not had increases to her exercise goals. We also talked about her peak flow numbers and an Asthma Action plan.   Dr. Emily Wood is Medical Director for [place] and [facility].

## 2015-05-05 DIAGNOSIS — J301 Allergic rhinitis due to pollen: Secondary | ICD-10-CM | POA: Diagnosis not present

## 2015-05-06 ENCOUNTER — Encounter: Payer: Self-pay | Admitting: Family Medicine

## 2015-05-07 ENCOUNTER — Encounter: Payer: Self-pay | Admitting: Internal Medicine

## 2015-05-07 ENCOUNTER — Telehealth: Payer: Self-pay

## 2015-05-07 DIAGNOSIS — N926 Irregular menstruation, unspecified: Secondary | ICD-10-CM

## 2015-05-07 DIAGNOSIS — J301 Allergic rhinitis due to pollen: Secondary | ICD-10-CM | POA: Diagnosis not present

## 2015-05-07 NOTE — Telephone Encounter (Signed)
Per pt email advice request:  Today, I went to [facility] office, for follow-up appt., set up via email by Morrie Sheldon for 2:45 PM , 05/07/15. I have been trying to set up this appt. since April 18th using phone msgs & emails. I am disappointed & frustrated to have been given an appt. at a time when Dr. Dema Severin was not at clinic. I am now scheduled for an appt. on 6/16 at 3:15.  Here are the questions and concerns I would like to discuss with Dr. Dema Severin:  1. I haven't seen any improvement with breathing therapy. So I decided to stop going, since it seem to be a waste of time. But thank you for set me up with therapy.  2. Is it possible to adjust meds (Advair and Steroid, inhaler)? I noticed they are not helping much as it did before.  3. My other concern is I have hard time going out in heat, humidity, etc; last year I didn't have that problem. I have to stay indoor where is cool, at 65 degree. If it's any higher, or off I find myself struggling to breathe, chest tightens, pain increases.  See you on 16th. Thank you.  Yesenia Wood    Please advise thanks

## 2015-05-07 NOTE — Telephone Encounter (Signed)
Ordered place for Ob/Gyn.

## 2015-05-11 DIAGNOSIS — J301 Allergic rhinitis due to pollen: Secondary | ICD-10-CM | POA: Diagnosis not present

## 2015-05-12 NOTE — Progress Notes (Signed)
Discharge Summary  Patient Details  Name: Yesenia Wood MRN: [id number] Date of Birth: 03-05-1986 Referring Provider:  Alba Cory, MD   Number of Visits: 15 Reason for Discharge:  Early Discharge: Ms Yesenia Wood requested to be discharged; she stated that [facility] was not helping her and had difficulty progressing  with exercise goals with her fibromyalgic pain.  Smoking History:  History  Smoking status   Former Smoker -- 1.00 packs/day for 2 years   Types: Cigarettes  Smokeless tobacco   Never Used    Diagnosis:  Intrinsic asthma, unspecified asthma severity, uncomplicated  ADL UCSD:     ADL UCSD      01/20/15 1330       ADL UCSD   ADL Phase Entry     SOB Score total 69     Rest 1     Walk 1     Stairs 4     Bath 2     Dress 2     Shop 2        Initial Exercise Prescription:   Discharge Exercise Prescription:     Discharge Prescription - 05/12/15 1100    Exercise Review   Progression Yes  Last exercise session attended 05/04/15   Response to Exercise   Blood Pressure (Admit) 110/68 mmHg   Blood Pressure (Exercise) 120/74 mmHg   Blood Pressure (Exit) 110/66 mmHg   Heart Rate (Admit) 98 bpm   Heart Rate (Exercise) 109 bpm   Heart Rate (Exit) 99 bpm   Oxygen Saturation (Admit) 98 %   Oxygen Saturation (Exercise) 97 %   Oxygen Saturation (Exit) 98 %   Rating of Perceived Exertion (Exercise) 15   Perceived Dyspnea (Exercise) 4   Frequency Add 3 additional days to program exercise sessions.   Duration Progress to 50 minutes of aerobic without signs/symptoms of physical distress   Intensity Rest + 30   Progression Continue progressive overload as per policy without signs/symptoms or physical distress.   Resistance Training   Training Prescription Yes   Weight 2   Reps 10-12   Home Exercise Plan   Plans to continue exercise at Home  Ms Yesenia Wood taked abiut getting a home stationary bike      Functional Capacity:     6 Minute Walk       01/20/15 1330       6 Minute Walk   Phase Initial     Distance 1350 feet     Walk Time 6 minutes     Resting HR 96 bpm     Resting BP 122/69 mmHg     Max Ex. HR 116 bpm     Max Ex. BP 146/82 mmHg     RPE 13     Perceived Dyspnea  5        Psychological, QOL, Others - Outcomes: PHQ 2/9: No flowsheet data found.  Quality of Life:   Personal Goals: Goals established at orientation with interventions provided to work toward goal.     Personal Goals and Risk Factors at Admission - 04/28/15 1415    Personal Goals and Risk Factors on Admission    Weight Management Yes   Intervention Learn and follow the exercise and diet guidelines while in the program. Utilize the nutrition and education classes to help gain knowledge of the diet and exercise expectations in the program   Admit Weight 175 lb 3.2 oz (79.47 kg)   Goal Weight 155 lb (70.308 kg)   Increase Aerobic  Exercise and Physical Activity Yes   Intervention While in program, learn and follow the exercise prescription taught. Start at a low level workload and increase workload after able to maintain previous level for 30 minutes. Increase time before increasing intensity.   Understand more about Heart/Pulmonary Disease. Yes   Intervention While in program utilize professionals for any questions, and attend the education sessions. Great websites to use are www.americanheart.org or www.lung.org for reliable information.   Improve shortness of breath with ADL's Yes   Intervention While in program, learn and follow the exercise prescription taught. Start at a low level workload and increase workload ad advised by the exercise physiologist. Increase time before increasing intensity.   Develop more efficient breathing techniques such as purse lipped breathing and diaphragmatic breathing; and practicing self-pacing with activity Yes   Intervention While in program, learn and utilize the specific breathing techniques taught to you.  Continue to practice and use the techniques as needed.   Increase knowledge of respiratory medications and ability to use respiratory devices properly.  Yes   Intervention While in program, learn to administer MDI, nebulizer, and spacer properly.;Learn to take respiratory medicine as ordered.;While in program, learn to Clean MDI, nebulizers, and spacers properly.       Personal Goals Discharge:     Personal Goals at Discharge - 05/12/15 0835    Weight Management   Goals Progress/Improvement seen No   Comments Ms Yesenia Wood set a goal to weigh 155. her current weight throughout the program has been maintained at 174-179; she prefered not to meet with dietitian.   Increase Aerobic Exercise and Physical Activity   Goals Progress/Improvement seen  Yes   Comments Although Ms Yesenia Wood states that she did not think [facility] was helping her and she was limited by fibromyalgic pain, she did increase her exercise goals during the 15 sessions she exercised with [place].    Understand more about Heart/Pulmonary Disease   Goals Progress/Improvement seen  Yes   Comments Ms Yesenia Wood attended our education classes and had a good understanding of the educational information.   Improve shortness of breath with ADL's   Comments Ms Yesenia Wood worked on improving her shortness of breath with her exercise, although her pain level did affect her Borg Scale with ranges of 3-4.   Breathing Techniques   Goals Progress/Improvement seen  Yes   Comments She used PLB during exercise and did improve her shortness of breath with this technique.   Increase knowledge of respiratory medications   Goals Progress/Improvement seen  Yes   Comments During her initial evaluation, Ms Yesenia Wood had a good understanding of her Advair and Ventolin MDI's.      Nutrition & Weight - Outcomes:    Nutrition:   Nutrition Discharge:   Education Questionnaire Score:   Thank you for the opportunity to work with your patient, Ms Yesenia Wood.

## 2015-05-12 NOTE — Progress Notes (Signed)
Discharge Summary  Patient Details  Name: Yesenia Wood MRN: [id number] Date of Birth: 10/14/1986 Referring Provider:  Alba Cory, MD   Number of Visits: 15  Reason for Discharge:  Early Exit:  Ms Hayhurst requested to be discharged; she states that [facility] was not helping her and was having difficulty progressing with the exercise goals due to her fibromyalgic pain.  Smoking History:  History  Smoking status   Former Smoker -- 1.00 packs/day for 2 years   Types: Cigarettes  Smokeless tobacco   Never Used    Diagnosis:  Intrinsic asthma, unspecified asthma severity, uncomplicated  ADL UCSD:     ADL UCSD      01/20/15 1330       ADL UCSD   ADL Phase Entry     SOB Score total 69     Rest 1     Walk 1     Stairs 4     Bath 2     Dress 2     Shop 2        Initial Exercise Prescription:   Discharge Exercise Prescription:     Discharge Prescription - 05/12/15 1100    Exercise Review   Progression Yes  Last exercise session attended 05/04/15   Response to Exercise   Blood Pressure (Admit) 110/68 mmHg   Blood Pressure (Exercise) 120/74 mmHg   Blood Pressure (Exit) 110/66 mmHg   Heart Rate (Admit) 98 bpm   Heart Rate (Exercise) 109 bpm   Heart Rate (Exit) 99 bpm   Oxygen Saturation (Admit) 98 %   Oxygen Saturation (Exercise) 97 %   Oxygen Saturation (Exit) 98 %   Rating of Perceived Exertion (Exercise) 15   Perceived Dyspnea (Exercise) 4   Frequency Add 3 additional days to program exercise sessions.   Duration Progress to 50 minutes of aerobic without signs/symptoms of physical distress   Intensity Rest + 30   Progression Continue progressive overload as per policy without signs/symptoms or physical distress.   Resistance Training   Training Prescription Yes   Weight 2   Reps 10-12   Treadmill   MPH 1.7   Grade 0   Minutes 15   NuStep   Level 3   Watts 40   Minutes 15   REL-XR   Level 3   Watts 25   Minutes 15   Home Exercise Plan    Plans to continue exercise at Home  Ms Dorriety taked abiut getting a home stationary bike      Functional Capacity:     6 Minute Walk      01/20/15 1330       6 Minute Walk   Phase Initial     Distance 1350 feet     Walk Time 6 minutes     Resting HR 96 bpm     Resting BP 122/69 mmHg     Max Ex. HR 116 bpm     Max Ex. BP 146/82 mmHg     RPE 13     Perceived Dyspnea  5        Psychological, QOL, Others - Outcomes: PHQ 2/9: No flowsheet data found.  Quality of Life:   Personal Goals: Goals established at orientation with interventions provided to work toward goal.     Personal Goals and Risk Factors at Admission - 04/28/15 1415    Personal Goals and Risk Factors on Admission    Weight Management Yes   Intervention Learn and follow the  exercise and diet guidelines while in the program. Utilize the nutrition and education classes to help gain knowledge of the diet and exercise expectations in the program   Admit Weight 175 lb 3.2 oz (79.47 kg)   Goal Weight 155 lb (70.308 kg)   Increase Aerobic Exercise and Physical Activity Yes   Intervention While in program, learn and follow the exercise prescription taught. Start at a low level workload and increase workload after able to maintain previous level for 30 minutes. Increase time before increasing intensity.   Understand more about Heart/Pulmonary Disease. Yes   Intervention While in program utilize professionals for any questions, and attend the education sessions. Great websites to use are www.americanheart.org or www.lung.org for reliable information.   Improve shortness of breath with ADL's Yes   Intervention While in program, learn and follow the exercise prescription taught. Start at a low level workload and increase workload ad advised by the exercise physiologist. Increase time before increasing intensity.   Develop more efficient breathing techniques such as purse lipped breathing and diaphragmatic breathing; and  practicing self-pacing with activity Yes   Intervention While in program, learn and utilize the specific breathing techniques taught to you. Continue to practice and use the techniques as needed.   Increase knowledge of respiratory medications and ability to use respiratory devices properly.  Yes   Intervention While in program, learn to administer MDI, nebulizer, and spacer properly.;Learn to take respiratory medicine as ordered.;While in program, learn to Clean MDI, nebulizers, and spacers properly.       Personal Goals Discharge:     Personal Goals at Discharge - 05/12/15 0835    Weight Management   Goals Progress/Improvement seen No   Comments Ms Furuta set a goal to weigh 155. her current weight throughout the program has been maintained at 174-179; she prefered not to meet with dietitian.   Increase Aerobic Exercise and Physical Activity   Goals Progress/Improvement seen  Yes   Comments Although Ms Froh states that she did not think [facility] was helping her and she was limited by fibromyalgic pain, she did increase her exercise goals during the 15 sessions she exercised with [place].    Understand more about Heart/Pulmonary Disease   Goals Progress/Improvement seen  Yes   Comments Ms Withrow attended our education classes and had a good understanding of the educational information.   Improve shortness of breath with ADL's   Comments Ms Dlugosz worked on improving her shortness of breath with her exercise, although her pain level did affect her Borg Scale with ranges of 3-4.   Breathing Techniques   Goals Progress/Improvement seen  Yes   Comments She used PLB during exercise and did improve her shortness of breath with this technique.   Increase knowledge of respiratory medications   Goals Progress/Improvement seen  Yes   Comments During her initial evaluation, Ms Kolk had a good understanding of her Advair and Ventolin MDI's.      Nutrition & Weight -  Outcomes:    Nutrition:   Nutrition Discharge:   Education Questionnaire Score:   Thank you for the opportunity to work with your patient, Ms Emmry Hinsch

## 2015-05-13 ENCOUNTER — Encounter: Payer: Self-pay | Admitting: Family Medicine

## 2015-05-13 DIAGNOSIS — R202 Paresthesia of skin: Secondary | ICD-10-CM | POA: Diagnosis not present

## 2015-05-13 DIAGNOSIS — R200 Anesthesia of skin: Secondary | ICD-10-CM | POA: Diagnosis not present

## 2015-05-14 ENCOUNTER — Ambulatory Visit (INDEPENDENT_AMBULATORY_CARE_PROVIDER_SITE_OTHER): Payer: Medicare Other | Admitting: Internal Medicine

## 2015-05-14 ENCOUNTER — Encounter: Payer: Self-pay | Admitting: Internal Medicine

## 2015-05-14 VITALS — BP 112/80 | HR 115 | Temp 98.4°F | Ht 63.0 in | Wt 176.0 lb

## 2015-05-14 DIAGNOSIS — J4540 Moderate persistent asthma, uncomplicated: Secondary | ICD-10-CM

## 2015-05-14 DIAGNOSIS — J45909 Unspecified asthma, uncomplicated: Secondary | ICD-10-CM

## 2015-05-14 DIAGNOSIS — J301 Allergic rhinitis due to pollen: Secondary | ICD-10-CM | POA: Diagnosis not present

## 2015-05-14 DIAGNOSIS — R0789 Other chest pain: Secondary | ICD-10-CM | POA: Insufficient documentation

## 2015-05-14 MED ORDER — ALBUTEROL SULFATE HFA 108 (90 BASE) MCG/ACT IN AERS: 2.0000 | INHALATION_SPRAY | RESPIRATORY_TRACT | Status: DC | PRN

## 2015-05-14 MED ORDER — PREDNISONE 5 MG PO TABS: 5.0000 mg | ORAL_TABLET | Freq: Every day | ORAL | Status: DC

## 2015-05-14 NOTE — Patient Instructions (Addendum)
Follow up with Dr. Dema Severin in 1 month - we will make a referral for you to see Dr. Mady Gemma (Cardiology) for your chest discomfort/pain - continue with current allergies regiment - shots by Dr. Andee Poles - cont with Advair, as needed albuterol - we will refill your prednisone - cont with 5mg  daily.  - we will give you refills for you ventolin also.

## 2015-05-14 NOTE — Progress Notes (Signed)
MRN# [id number] Yesenia Wood 1986/10/03   CC: Chief Complaint  Patient presents with  . Follow-up    Asthma/ENT f/u:Pt has d/c Pulm Rehab; she is taking allergy injection ENT; Pt is having chest tightness/pain, sl sob due to heat.      Brief History: 10/07/2014 History: She is a pleasant 29 year old female accompanied by her mother and interpreter, seen in consultation for worsening asthma and shortness of breath, referred by Dr. Carlynn Purl.  History per the patient and review of medical records Patient is a past medical history of deafness, status post cochlear implant with failure, fibromyalgia, B12 deficiency, moderate asthma, obesity. Her asthma history is as follows: As a teenager exercise-induced bronchospasms that was treated with as needed albuterol, and her early 27s started having more shortness of breath and was previously on Qvar. Triggers: Unsure, mild exacerbation with seasonal changes, more shortness of breath with warm weather (this is very mild). Patient cannot identify any inciting factor 2-3 months ago that made her breathing more difficult. She had a spirometry done at her previous pulmonologist in [place], 07/11/2013, FEV1 79, FEV1/FVC 74. Her repeat spirometry at [facility] showed FEV1 48%, FEV1/FVC 66% on 09/02/2014. There has been no significantrespiratory illness in the past year the patient or her mother can identify a significant drop in her FEV1 and worsening of her symptoms. 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 She previously smoked one to 2 packs per day for 2 years and quit 8 years ago. She denies any E. Cigarettes or water-based tobacco products. Patient has a dog, Yorkie, that stay her,she states that she is very clean, does not have a lot of carpeting, no roaches or rodent infestations in her home. 2 months ago she was diagnosed with a urinary tract infection,  shortly after that chest tightness and difficulty breathing, went to the ER, had a negative d-dimer, will was told she had chest wall discomfort and was given a prednisone taper for shortness of breath. Upon completion of her prednisone taper she develop right-sided facial Bell's palsy. She has also had testing for Lyme, which was negative. On 09/16/2014 her primary care physician changed her controller medication to Advair 250/50, from Qvar, she is also on Ventolin, which she is currently using 3-4 times per day for chest tightness and shortness of breath: She states this does not provide significant relief. She denies fever, chills. 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 She could not recall any tick bites. Patient states that she has a peak flow meter at home and uses it every morning, currently she is averaging about 300 for the past one month, which is the yellow zone for her. Patient denies any cough, runny nose, mucus production.  PLAN: - CT Chest, IgE and IgG level, alpha1-antitrypsin, cardiac ECHO (bubble studies)   ROV 11/12/14 Patient presents today for a follow up visit of asthma (uncontrolled) Advair increased at last visit, no significant improvement.  At this point still with sob, and chest tightness, using albuterol 2-3 times a day for chest tightness and wheezing and "vibration" in chest.  Currently seeing ENT (Dr. 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 She would also like to discuss the results of her labs and imaging studies. Mother present at today's visit.  Plan -  mild asthma exacerbation - Prednisone taper over 1 month, advair, prn albuterol   Events since last clinic visit: Since her last visit she has had a 1 month trial of prednisone, which greatly improved her symptoms (chest tightness and sob). She stated that she complete steroid  last week, and this week the symptoms are back, she is currently using albuterol 2-3 times per day; previously on steroid maybe 1-2 times per day. Her family is planning to remove the rugs\carpet and get hypoallergenic pillows and sheets, due to dust mite allergy.  She has seen ENT (Dr. Andee Poles) and was told that her vocal cords were fine and was told that she had dust mite allergy. She had RAST testing done.  She has not had allergy testing since childhood.  Plan  - allergy testing [facility] [place] (Dr. Maple Hudson), prednisone 5mg  daily, follow up with ENT  Events since last clinic visit:  Yesenia Wood presents today for a follow up visit of her asthma, which has been difficult to control. Her asthma she is currently on Advair 500/50, prednisone 5 mg daily, and as needed albuterol. She states that she's currently using albuterol 3-4 times a day for the past 2-3 weeks. At this time her triggers are heat and rapid weather changes. Since her last visit she has seen ENT, Dr. Andee Poles who stated that she had severe allergies to pollen, dust mites, grass/ragweed. She is currently receiving allergy shots 2 times per week. Also she has stopped pulmonary rehabilitation due to not providing any benefit. She was complaining of right sided stomach pain and flank pain,along with constipation to her primary care physician, which prompted an ultrasound of her abdomen; ultrasound yielded fatty liver. Today she is complaining of chest tightness, and some shortness of breath, but this is tolerable for her. Over the last 2-3 months she states that she's also complaining of chest pain/chest discomfort, she states that her chest feels tight and she becomes diaphoretic and very short winded. When this occurs she uses her albuterol, but it does not help much. This chest discomfort has increased over the last 3 months.  Per her visit with Dr. Maple Hudson April 2016: Allergy skin testing 03/04/15- Strong Positive esp for trees and dust  mites, some molds.   Medication:   Current Outpatient Rx  Name  Route  Sig  Dispense  Refill  . albuterol (PROVENTIL HFA;VENTOLIN HFA) 108 (90 BASE) MCG/ACT inhaler   Inhalation   Inhale 2 puffs into the lungs every 4 (four) hours as needed for wheezing or shortness of breath.   6.7 g   5   . Ascorbic Acid (VITAMIN C) 1000 MG tablet   Oral   Take 1,000 mg by mouth daily.         . Cyanocobalamin (RA VITAMIN B-12 TR) 1000 MCG TBCR   Oral   Take by mouth.         . cyclobenzaprine (FLEXERIL) 10 MG tablet   Oral   Take 10 mg by mouth daily as needed for muscle spasms.         . ferrous sulfate 325 (65 FE) MG tablet   Oral   Take 1 tablet by mouth daily.         . Fluticasone-Salmeterol (ADVAIR DISKUS) 500-50 MCG/DOSE AEPB   Inhalation   Inhale 1 puff into the lungs 2 (two) times daily.   60 each   5   . Milk Thistle 500 MG CAPS   Oral   Take 1 capsule by  mouth daily as needed.         . Multiple Vitamin (MULTI-VITAMINS) TABS   Oral   Take 1 tablet by mouth daily as needed.         . predniSONE (DELTASONE) 5 MG tablet   Oral   Take 1 tablet (5 mg total) by mouth daily with breakfast.   30 tablet   1   . traMADol (ULTRAM) 50 MG tablet   Oral   Take 50 mg by mouth every 8 (eight) hours as needed.         . Turmeric 450 MG CAPS   Oral   Take 1 capsule by mouth daily.            Review of Systems: Gen:  Denies  fever, sweats, chills HEENT: Denies blurred vision, double vision, ear pain, eye pain, hearing loss, nose bleeds, sore throat Cvc:  No dizziness, chest pain or heaviness Resp:   Admits to: Tautness of breath, chest tightness Gi: Denies swallowing difficulty, stomach pain, nausea or vomiting, diarrhea, constipation, bowel incontinence Gu:  Denies bladder incontinence, burning urine Ext:   No Joint pain, stiffness or swelling Skin: No skin rash, easy bruising or bleeding or hives Endoc:  No polyuria, polydipsia , polyphagia or weight  change Other:  All other systems negative  Allergies:  Symbicort; Gabapentin; Diclofenac; Duloxetine hcl; Montelukast sodium; Mupirocin; Naproxen; and Sulfa antibiotics  Physical Examination:  VS: BP 112/80 mmHg  Pulse 115  Temp(Src) 98.4 F (36.9 C) (Oral)  Ht  (1.6 m)  Wt 176 lb (79.833 kg)  BMI 31.18 kg/m2  SpO2 96%  General Appearance: No distress  HEENT: PERRLA, no ptosis, no other lesions noticed Pulmonary:normal breath sounds., diaphragmatic excursion normal.No wheezing, No rales   Cardiovascular:  Normal S1,S2.  No m/r/g.     Abdomen:Exam: Benign, Soft, non-tender, No masses  Skin:   warm, no rashes, no ecchymosis  Extremities: normal, no cyanosis, clubbing, warm with normal capillary refill.       Assessment and Plan: 29 year old female with severe persistent asthma seen in consultation for follow-up visit. Moderate persistent intrinsic asthma without status asthmaticus without complication Atopic as a small child. Role of environmental atopic triggers now is unclear. Total IgE not high, and reported in vitro test showed only dust mites. Allergy skin testing 03/04/15- Strong Positive esp for trees and dust mites, some molds.  Currently patient is receiving allergy shots at ENT office. Current regimen is: Advair 500/50, prednisone 5 mg daily, rescue inhaler, allergy shots.  Over the past 2-3 weeks she's had increasing use of her albuterol rescue inhaler, making her severe persistent asthmatic. Given the environmental changes such as heat, change in season, her asthma has become more difficult to control at this time. Avoidance of triggers is paramount to her quality of life, however this is difficult given that her triggers are mostly environmentally related. Will discuss with ENT/allergy about starting an immune modulator such as Xolair, in addition to her current asthma regimen. This was explained to patient and her mother via sign language interpreter, again  starting Xolair is not a definite therapy at this time, and can be considered in the future given her response to allergy shots.  Plan: -Continue with current allergy shots and current asthma regimen. -Refill prednisone. -Avoid asthma triggers as much as tolerable.    Chest discomfort At this time I do believe most of her chest discomfort is secondary to bronchospasms and asthma related issues. However, with the diaphoresis  and increasing frequency, cardiac issues cannot be completely excluded. I have discussed the case with Dr. Mady Gemma, and he is willing to evaluate the patient.  Plan: -Continue current asthma regimen. -Follow-up with cardiology, Dr. Mady Gemma.    Updated Medication List Outpatient Encounter Prescriptions as of 05/14/2015  Medication Sig  . albuterol (PROVENTIL HFA;VENTOLIN HFA) 108 (90 BASE) MCG/ACT inhaler Inhale 2 puffs into the lungs every 4 (four) hours as needed for wheezing or shortness of breath.  . Ascorbic Acid (VITAMIN C) 1000 MG tablet Take 1,000 mg by mouth daily.  . Cyanocobalamin (RA VITAMIN B-12 TR) 1000 MCG TBCR Take by mouth.  . cyclobenzaprine (FLEXERIL) 10 MG tablet Take 10 mg by mouth daily as needed for muscle spasms.  . ferrous sulfate 325 (65 FE) MG tablet Take 1 tablet by mouth daily.  . Fluticasone-Salmeterol (ADVAIR DISKUS) 500-50 MCG/DOSE AEPB Inhale 1 puff into the lungs 2 (two) times daily.  . Milk Thistle 500 MG CAPS Take 1 capsule by mouth daily as needed.  . Multiple Vitamin (MULTI-VITAMINS) TABS Take 1 tablet by mouth daily as needed.  . predniSONE (DELTASONE) 5 MG tablet Take 1 tablet (5 mg total) by mouth daily with breakfast.  . traMADol (ULTRAM) 50 MG tablet Take 50 mg by mouth every 8 (eight) hours as needed.  . Turmeric 450 MG CAPS Take 1 capsule by mouth daily.  . [DISCONTINUED] Cyanocobalamin (B-12) 1000 MCG SUBL Place 1 tablet under the tongue daily.  . [DISCONTINUED] drospirenone-ethinyl estradiol  (YAZ,GIANVI,LORYNA) 3-0.02 MG tablet Take 1 tablet by mouth daily.  . [DISCONTINUED] nortriptyline (PAMELOR) 10 MG capsule Take 1 capsule at night for 1 week, then increase to 2 capsules at night and continue   No facility-administered encounter medications on file as of 05/14/2015.    Orders for this visit: Orders Placed This Encounter  Procedures  . Ambulatory referral to Cardiology    Referral Priority:  Routine    Referral Type:  Consultation    Referral Reason:  Specialty Services Required    Requested Specialty:  Cardiology    Number of Visits Requested:  1    Thank  you for the visitation and for allowing  [facility] to assist in the care of your patient. Our recommendations are noted above.  Please contact us if we can be of further service.  Stephanie Acre, MD [facility] Office Number: [phone]

## 2015-05-15 ENCOUNTER — Telehealth: Payer: Self-pay | Admitting: *Deleted

## 2015-05-15 NOTE — Assessment & Plan Note (Signed)
At this time I do believe most of her chest discomfort is secondary to bronchospasms and asthma related issues. However, with the diaphoresis and increasing frequency, cardiac issues cannot be completely excluded. I have discussed the case with Dr. Mady Gemma, and he is willing to evaluate the patient.  Plan: -Continue current asthma regimen. -Follow-up with cardiology, Dr. Mady Gemma.

## 2015-05-15 NOTE — Telephone Encounter (Signed)
Appt changed from 7/21with Dr. Melburn Popper to 7/12 with Dr. Kirke Corin.

## 2015-05-15 NOTE — Assessment & Plan Note (Signed)
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 Role of environmental atopic triggers now is unclear. Total IgE not high, and reported in vitro test showed only dust mites. Allergy skin testing 03/04/15- Strong Positive esp for trees and dust mites, some molds.  Currently patient is receiving allergy shots at ENT office. Current regimen is: Advair 500/50, prednisone 5 mg daily, rescue inhaler, allergy shots.  Over the past 2-3 weeks she's had increasing use of her albuterol rescue inhaler, making her severe persistent asthmatic. Given the environmental changes such as heat, change in season, her asthma has become more difficult to control at this time. Avoidance of triggers is paramount to her quality of life, however this is difficult given that her triggers are mostly environmentally related. Will discuss with ENT/allergy about starting an immune modulator such as Xolair, in addition to her current asthma regimen. This was explained to patient and her mother via sign language interpreter, again starting Xolair is not a definite therapy at this time, and can be considered in the future given her response to allergy shots.  Plan: -Continue with current allergy shots and current asthma regimen. -Refill prednisone. -Avoid asthma triggers as much as tolerable.

## 2015-05-16 ENCOUNTER — Encounter: Payer: Self-pay | Admitting: Family Medicine

## 2015-05-16 DIAGNOSIS — Z9621 Cochlear implant status: Secondary | ICD-10-CM | POA: Insufficient documentation

## 2015-05-16 DIAGNOSIS — N926 Irregular menstruation, unspecified: Secondary | ICD-10-CM | POA: Insufficient documentation

## 2015-05-16 DIAGNOSIS — G510 Bell's palsy: Secondary | ICD-10-CM | POA: Insufficient documentation

## 2015-05-16 DIAGNOSIS — H9190 Unspecified hearing loss, unspecified ear: Secondary | ICD-10-CM | POA: Insufficient documentation

## 2015-05-16 DIAGNOSIS — R519 Headache, unspecified: Secondary | ICD-10-CM | POA: Insufficient documentation

## 2015-05-16 DIAGNOSIS — E781 Pure hyperglyceridemia: Secondary | ICD-10-CM | POA: Insufficient documentation

## 2015-05-16 DIAGNOSIS — R51 Headache: Secondary | ICD-10-CM

## 2015-05-16 DIAGNOSIS — R202 Paresthesia of skin: Secondary | ICD-10-CM | POA: Insufficient documentation

## 2015-05-16 DIAGNOSIS — E663 Overweight: Secondary | ICD-10-CM | POA: Insufficient documentation

## 2015-05-16 DIAGNOSIS — M542 Cervicalgia: Secondary | ICD-10-CM | POA: Insufficient documentation

## 2015-05-16 DIAGNOSIS — R739 Hyperglycemia, unspecified: Secondary | ICD-10-CM | POA: Insufficient documentation

## 2015-05-16 DIAGNOSIS — Z8659 Personal history of other mental and behavioral disorders: Secondary | ICD-10-CM | POA: Insufficient documentation

## 2015-05-16 DIAGNOSIS — K219 Gastro-esophageal reflux disease without esophagitis: Secondary | ICD-10-CM | POA: Insufficient documentation

## 2015-05-16 DIAGNOSIS — M797 Fibromyalgia: Secondary | ICD-10-CM | POA: Insufficient documentation

## 2015-05-16 DIAGNOSIS — E538 Deficiency of other specified B group vitamins: Secondary | ICD-10-CM | POA: Insufficient documentation

## 2015-05-16 DIAGNOSIS — K760 Fatty (change of) liver, not elsewhere classified: Secondary | ICD-10-CM | POA: Insufficient documentation

## 2015-05-16 DIAGNOSIS — H8190 Unspecified disorder of vestibular function, unspecified ear: Secondary | ICD-10-CM | POA: Insufficient documentation

## 2015-05-18 ENCOUNTER — Encounter: Payer: Self-pay | Admitting: Family Medicine

## 2015-05-18 ENCOUNTER — Ambulatory Visit (INDEPENDENT_AMBULATORY_CARE_PROVIDER_SITE_OTHER): Payer: Medicare Other | Admitting: Family Medicine

## 2015-05-18 VITALS — BP 118/78 | HR 98 | Temp 98.3°F | Resp 18 | Ht 63.25 in | Wt 182.1 lb

## 2015-05-18 DIAGNOSIS — L309 Dermatitis, unspecified: Secondary | ICD-10-CM

## 2015-05-18 DIAGNOSIS — J301 Allergic rhinitis due to pollen: Secondary | ICD-10-CM | POA: Diagnosis not present

## 2015-05-18 MED ORDER — TRIAMCINOLONE ACETONIDE 0.1 % EX CREA: 1.0000 "application " | TOPICAL_CREAM | Freq: Two times a day (BID) | CUTANEOUS | Status: DC

## 2015-05-18 NOTE — Progress Notes (Signed)
Name: Yesenia Wood   MRN: [id number]    DOB: November 20, 1986   Date:05/18/2015       Progress Note  Subjective  Chief Complaint  Chief Complaint  Patient presents with  . Rash    2 weeks-right hip and left hand- red, itchy, dry, burning. Has tried hand lotion, [place], [place] Bond and Benadryl. Worst.     HPI  Rash: started a couple of weeks ago, red dots on thighs , some areas improved and some areas progressed as one big rash on right thigh.  It is pruriginous, it is painful, burns when she puts anti-itchy medication on it. The rash is dry. She does not recall any change in hygiene products or any new exposure that she can recall. No fever, no increase in fatigue, no joint pains.   Patient Active Problem List   Diagnosis Date Noted  . Hearing loss 05/16/2015  . B12 deficiency 05/16/2015  . Cochlear implant status 05/16/2015  . Fatty infiltration of liver 05/16/2015  . Fibromyalgia syndrome 05/16/2015  . Gastro-esophageal reflux disease without esophagitis 05/16/2015  . Generalized headache 05/16/2015  . H/O Bell's palsy 05/16/2015  . H/O: depression 05/16/2015  . Hypertriglyceridemia 05/16/2015  . Blood glucose elevated 05/16/2015  . Irregular menstrual cycle 05/16/2015  . Overweight 05/16/2015  . Paresthesia 05/16/2015  . Disorder of labyrinth 05/16/2015  . Dyspnea 10/07/2014  . Moderate persistent intrinsic asthma without status asthmaticus without complication 10/07/2014    History  Substance Use Topics  . Smoking status: Former Smoker -- 1.00 packs/day for 2 years    Types: Cigarettes    Start date: 11/28/2004    Quit date: 11/28/2006  . Smokeless tobacco: Never Used  . Alcohol Use: 0.0 oz/week    0 Standard drinks or equivalent per week     Comment: rarely     Current outpatient prescriptions:  .  albuterol (PROVENTIL HFA;VENTOLIN HFA) 108 (90 BASE) MCG/ACT inhaler, Inhale 2 puffs into the lungs every 4 (four) hours as needed for wheezing or shortness of breath.,  Disp: 6.7 g, Rfl: 5 .  Ascorbic Acid (VITAMIN C) 1000 MG tablet, Take 1,000 mg by mouth daily., Disp: , Rfl:  .  Cyanocobalamin (RA VITAMIN B-12 TR) 1000 MCG TBCR, Take by mouth., Disp: , Rfl:  .  cyclobenzaprine (FLEXERIL) 10 MG tablet, Take 10 mg by mouth daily as needed for muscle spasms., Disp: , Rfl:  .  drospirenone-ethinyl estradiol (LORYNA) 3-0.02 MG tablet, Take 1 tablet by mouth daily., Disp: , Rfl:  .  ferrous sulfate 325 (65 FE) MG tablet, Take 1 tablet by mouth daily., Disp: , Rfl:  .  Fluticasone-Salmeterol (ADVAIR DISKUS) 500-50 MCG/DOSE AEPB, Inhale 1 puff into the lungs 2 (two) times daily., Disp: 60 each, Rfl: 5 .  Milk Thistle 500 MG CAPS, Take 1 capsule by mouth daily as needed., Disp: , Rfl:  .  Multiple Vitamin (MULTI-VITAMINS) TABS, Take 1 tablet by mouth daily as needed., Disp: , Rfl:  .  predniSONE (DELTASONE) 5 MG tablet, Take 1 tablet (5 mg total) by mouth daily with breakfast., Disp: 30 tablet, Rfl: 1 .  traMADol (ULTRAM) 50 MG tablet, Take 50 mg by mouth every 8 (eight) hours as needed., Disp: , Rfl:  .  Turmeric 450 MG CAPS, Take 1 capsule by mouth daily., Disp: , Rfl:   Allergies  Allergen Reactions  . Symbicort [Budesonide-Formoterol Fumarate] Anaphylaxis and Other (See Comments)    Chest pain  . Gabapentin Nausea And Vomiting  .  Diclofenac Other (See Comments)  . Duloxetine Hcl Other (See Comments)  . Montelukast Sodium Other (See Comments)  . Mupirocin Other (See Comments)  . Naproxen Other (See Comments)  . Sulfa Antibiotics Hives    ROS  Ten systems reviewed and is negative except as mentioned in HPI    Objective  Filed Vitals:   05/18/15 1357  BP: 118/78  Pulse: 98  Temp: 98.3 F (36.8 C)  TempSrc: Oral  Resp: 18  Height: 5' 3.25" (1.607 m)  Weight: 182 lb 1.6 oz (82.6 kg)  SpO2: 99%    Body mass index is 31.99 kg/(m^2).    Physical Exam  Constitutional: Patient appears well-developed and well-nourished. No distress.  Obese Eyes:  No scleral icterus.  Neck: Normal range of motion. Neck supple. Cardiovascular: Normal rate, regular rhythm and normal heart sounds.  No murmur heard. No BLE edema. Pulmonary/Chest: Effort normal and breath sounds normal. No respiratory distress. Abdominal: Soft.  There is no tenderness. Psychiatric: Patient has a normal mood and affect. behavior is normal. Judgment and thought content normal. Skin: some excoriation on her outer thighs. She has a large dry patch on right outer thigh with some erythema  Assessment & Plan   1. Eczema Start lotion twice daily - triamcinolone cream (KENALOG) 0.1 %; Apply 1 application topically 2 (two) times daily.  Dispense: 80 g; Refill: 0

## 2015-05-18 NOTE — Patient Instructions (Signed)
Eczema Eczema, also called atopic dermatitis, is a skin disorder that causes inflammation of the skin. It causes a red rash and dry, scaly skin. The skin becomes very itchy. Eczema is generally worse during the cooler winter months and often improves with the warmth of summer. Eczema usually starts showing signs in infancy. Some children outgrow eczema, but it may last through adulthood.  CAUSES  The exact cause of eczema is not known, but it appears to run in families. People with eczema often have a family history of eczema, allergies, asthma, or hay fever. Eczema is not contagious. Flare-ups of the condition may be caused by:   Contact with something you are sensitive or allergic to.   Stress. SIGNS AND SYMPTOMS  Dry, scaly skin.   Red, itchy rash.   Itchiness. This may occur before the skin rash and may be very intense.  DIAGNOSIS  The diagnosis of eczema is usually made based on symptoms and medical history. TREATMENT  Eczema cannot be cured, but symptoms usually can be controlled with treatment and other strategies. A treatment plan might include:  Controlling the itching and scratching.   Use over-the-counter antihistamines as directed for itching. This is especially useful at night when the itching tends to be worse.   Use over-the-counter steroid creams as directed for itching.   Avoid scratching. Scratching makes the rash and itching worse. It may also result in a skin infection (impetigo) due to a break in the skin caused by scratching.   Keeping the skin well moisturized with creams every day. This will seal in moisture and help prevent dryness. Lotions that contain alcohol and water should be avoided because they can dry the skin.   Limiting exposure to things that you are sensitive or allergic to (allergens).   Recognizing situations that cause stress.   Developing a plan to manage stress.  HOME CARE INSTRUCTIONS   Only take over-the-counter or  prescription medicines as directed by your health care provider.   Do not use anything on the skin without checking with your health care provider.   Keep baths or showers short (5 minutes) in warm (not hot) water. Use mild cleansers for bathing. These should be unscented. You may add nonperfumed bath oil to the bath water. It is best to avoid soap and bubble bath.   Immediately after a bath or shower, when the skin is still damp, apply a moisturizing ointment to the entire body. This ointment should be a petroleum ointment. This will seal in moisture and help prevent dryness. The thicker the ointment, the better. These should be unscented.   Keep fingernails cut short. Children with eczema may need to wear soft gloves or mittens at night after applying an ointment.   Dress in clothes made of cotton or cotton blends. Dress lightly, because heat increases itching.   A child with eczema should stay away from anyone with fever blisters or cold sores. The virus that causes fever blisters (herpes simplex) can cause a serious skin infection in children with eczema. SEEK MEDICAL CARE IF:   Your itching interferes with sleep.   Your rash gets worse or is not better within 1 week after starting treatment.   You see pus or soft yellow scabs in the rash area.   You have a fever.   You have a rash flare-up after contact with someone who has fever blisters.  Document Released: 11/11/2000 Document Revised: 09/04/2013 Document Reviewed: 06/17/2013 Exit[employer] Patient Information 2015 [employer]. This information   is not intended to replace advice given to you by your health care provider. Make sure you discuss any questions you have with your health care provider.  

## 2015-05-21 DIAGNOSIS — J301 Allergic rhinitis due to pollen: Secondary | ICD-10-CM | POA: Diagnosis not present

## 2015-05-25 ENCOUNTER — Encounter: Payer: Self-pay | Admitting: Internal Medicine

## 2015-05-25 DIAGNOSIS — J301 Allergic rhinitis due to pollen: Secondary | ICD-10-CM | POA: Diagnosis not present

## 2015-05-25 DIAGNOSIS — J45909 Unspecified asthma, uncomplicated: Secondary | ICD-10-CM

## 2015-05-25 MED ORDER — PREDNISONE 5 MG PO TABS: 5.0000 mg | ORAL_TABLET | Freq: Every day | ORAL | Status: DC

## 2015-05-29 DIAGNOSIS — J301 Allergic rhinitis due to pollen: Secondary | ICD-10-CM | POA: Diagnosis not present

## 2015-06-03 ENCOUNTER — Encounter: Payer: Self-pay | Admitting: Obstetrics and Gynecology

## 2015-06-03 ENCOUNTER — Ambulatory Visit (INDEPENDENT_AMBULATORY_CARE_PROVIDER_SITE_OTHER): Payer: Medicare Other | Admitting: Obstetrics and Gynecology

## 2015-06-03 VITALS — BP 126/87 | HR 97 | Ht 63.0 in | Wt 184.6 lb

## 2015-06-03 DIAGNOSIS — N946 Dysmenorrhea, unspecified: Secondary | ICD-10-CM

## 2015-06-03 DIAGNOSIS — N920 Excessive and frequent menstruation with regular cycle: Secondary | ICD-10-CM

## 2015-06-03 DIAGNOSIS — Z3040 Encounter for surveillance of contraceptives, unspecified: Secondary | ICD-10-CM

## 2015-06-03 NOTE — Progress Notes (Signed)
Subjective:     Patient ID: Yesenia Wood, female   DOB: 05/08/1986, 29 y.o.   MRN: [id number]  HPI Here to discuss IUD for contraception and menorrhagia control  Review of Systems Had tried Depo, Nuvaring and currently OCPs and is not happy with any of them: Depo didn't control bleeding and Nuvaring made her hormonal, the OCPs are not working to help with cramping.    Objective:   Physical Exam Pelvic exam: normal external genitalia, vulva, vagina, cervix, uterus and adnexa.    Assessment:     Contraception counseling  Menorrhagia and dysmenorrhea     Plan:     Rx for cytotec 100mcg to place vaginally night before insertion. To Take 600mg  motrin 30 minutes before appointment.   RTC 1 week for Mirena insertion.   Knoxx Boeding Ines BloomerBurr, CNM

## 2015-06-03 NOTE — Patient Instructions (Signed)
Levonorgestrel intrauterine device (IUD) What is this medicine? LEVONORGESTREL IUD (LEE voe nor jes trel) is a contraceptive (birth control) device. The device is placed inside the uterus by a healthcare professional. It is used to prevent pregnancy and can also be used to treat heavy bleeding that occurs during your period. Depending on the device, it can be used for 3 to 5 years. This medicine may be used for other purposes; ask your health care provider or pharmacist if you have questions. COMMON BRAND NAME(S): LILETTA, Mirena, Skyla What should I tell my health care provider before I take this medicine? They need to know if you have any of these conditions: -abnormal Pap smear -cancer of the breast, uterus, or cervix -diabetes -endometritis -genital or pelvic infection now or in the past -have more than one sexual partner or your partner has more than one partner -heart disease -history of an ectopic or tubal pregnancy -immune system problems -IUD in place -liver disease or tumor -problems with blood clots or take blood-thinners -use intravenous drugs -uterus of unusual shape -vaginal bleeding that has not been explained -an unusual or allergic reaction to levonorgestrel, other hormones, silicone, or polyethylene, medicines, foods, dyes, or preservatives -pregnant or trying to get pregnant -breast-feeding How should I use this medicine? This device is placed inside the uterus by a health care professional. Talk to your pediatrician regarding the use of this medicine in children. Special care may be needed. Overdosage: If you think you have taken too much of this medicine contact a poison control center or emergency room at once. NOTE: This medicine is only for you. Do not share this medicine with others. What if I miss a dose? This does not apply. What may interact with this medicine? Do not take this medicine with any of the following  medications: -amprenavir -bosentan -fosamprenavir This medicine may also interact with the following medications: -aprepitant -barbiturate medicines for inducing sleep or treating seizures -bexarotene -griseofulvin -medicines to treat seizures like carbamazepine, ethotoin, felbamate, oxcarbazepine, phenytoin, topiramate -modafinil -pioglitazone -rifabutin -rifampin -rifapentine -some medicines to treat HIV infection like atazanavir, indinavir, lopinavir, nelfinavir, tipranavir, ritonavir -St. John's wort -warfarin This list may not describe all possible interactions. Give your health care provider a list of all the medicines, herbs, non-prescription drugs, or dietary supplements you use. Also tell them if you smoke, drink alcohol, or use illegal drugs. Some items may interact with your medicine. What should I watch for while using this medicine? Visit your doctor or health care professional for regular check ups. See your doctor if you or your partner has sexual contact with others, becomes HIV positive, or gets a sexual transmitted disease. This product does not protect you against HIV infection (AIDS) or other sexually transmitted diseases. You can check the placement of the IUD yourself by reaching up to the top of your vagina with clean fingers to feel the threads. Do not pull on the threads. It is a good habit to check placement after each menstrual period. Call your doctor right away if you feel more of the IUD than just the threads or if you cannot feel the threads at all. The IUD may come out by itself. You may become pregnant if the device comes out. If you notice that the IUD has come out use a backup birth control method like condoms and call your health care provider. Using tampons will not change the position of the IUD and are okay to use during your period. What side effects may   I notice from receiving this medicine? Side effects that you should report to your doctor or  health care professional as soon as possible: -allergic reactions like skin rash, itching or hives, swelling of the face, lips, or tongue -fever, flu-like symptoms -genital sores -high blood pressure -no menstrual period for 6 weeks during use -pain, swelling, warmth in the leg -pelvic pain or tenderness -severe or sudden headache -signs of pregnancy -stomach cramping -sudden shortness of breath -trouble with balance, talking, or walking -unusual vaginal bleeding, discharge -yellowing of the eyes or skin Side effects that usually do not require medical attention (report to your doctor or health care professional if they continue or are bothersome): -acne -breast pain -change in sex drive or performance -changes in weight -cramping, dizziness, or faintness while the device is being inserted -headache -irregular menstrual bleeding within first 3 to 6 months of use -nausea This list may not describe all possible side effects. Call your doctor for medical advice about side effects. You may report side effects to FDA at 1-800-FDA-1088. Where should I keep my medicine? This does not apply. NOTE: This sheet is a summary. It may not cover all possible information. If you have questions about this medicine, talk to your doctor, pharmacist, or health care provider.  2015, [employer] Standard. (2011-12-15 13:54:04)  

## 2015-06-04 ENCOUNTER — Telehealth: Payer: Self-pay | Admitting: Internal Medicine

## 2015-06-04 DIAGNOSIS — R0600 Dyspnea, unspecified: Secondary | ICD-10-CM

## 2015-06-04 DIAGNOSIS — J301 Allergic rhinitis due to pollen: Secondary | ICD-10-CM | POA: Diagnosis not present

## 2015-06-04 MED ORDER — FLUTICASONE-SALMETEROL 500-50 MCG/DOSE IN AEPB: 1.0000 | INHALATION_SPRAY | Freq: Two times a day (BID) | RESPIRATORY_TRACT | Status: DC

## 2015-06-04 NOTE — Telephone Encounter (Signed)
Rx has been sent in. Nothing further was needed. 

## 2015-06-08 DIAGNOSIS — J301 Allergic rhinitis due to pollen: Secondary | ICD-10-CM | POA: Diagnosis not present

## 2015-06-09 ENCOUNTER — Ambulatory Visit (INDEPENDENT_AMBULATORY_CARE_PROVIDER_SITE_OTHER): Payer: Medicare Other | Admitting: Cardiovascular Disease

## 2015-06-09 ENCOUNTER — Encounter: Payer: Self-pay | Admitting: Cardiovascular Disease

## 2015-06-09 VITALS — BP 124/90 | HR 95 | Ht 63.0 in | Wt 183.5 lb

## 2015-06-09 DIAGNOSIS — R002 Palpitations: Secondary | ICD-10-CM | POA: Insufficient documentation

## 2015-06-09 DIAGNOSIS — R079 Chest pain, unspecified: Secondary | ICD-10-CM | POA: Diagnosis not present

## 2015-06-09 NOTE — Assessment & Plan Note (Signed)
Most likely, this is due to her asthma. However, she complains of persistent exertional chest tightness. She is not a smoker and has no major risk factors for coronary artery disease. Cardiac physical exam is unremarkable. Baseline ECG is unremarkable other than low voltage. Previous echocardiogram in November of last year was normal. I requested a treadmill stress test.

## 2015-06-09 NOTE — Patient Instructions (Addendum)
Medication Instructions:  Your physician recommends that you continue on your current medications as directed. Please refer to the Current Medication list given to you today.   Labwork: none  Testing/Procedures: Your physician has requested that you have an exercise tolerance test.   Your physician has recommended that you wear a 48 hour holter monitor. Holter monitors are medical devices that record the heart's electrical activity. Doctors most often use these monitors to diagnose arrhythmias. Arrhythmias are problems with the speed or rhythm of the heartbeat. The monitor is a small, portable device. You can wear one while you do your normal daily activities. This is usually used to diagnose what is causing palpitations/syncope (passing out).     Follow-Up: Your physician recommends that you schedule a follow-up appointment as needed with Dr. Kirke Corin.    Any Other Special Instructions Will Be Listed Below (If Applicable).  Exercise Stress Electrocardiogram An exercise stress electrocardiogram is a test that is done to evaluate the blood supply to your heart. This test may also be called exercise stress electrocardiography. The test is done while you are walking on a treadmill. The goal of this test is to raise your heart rate. This test is done to find areas of poor blood flow to the heart by determining the extent of coronary artery disease (CAD).   CAD is defined as narrowing in one or more heart (coronary) arteries of more than 70%. If you have an abnormal test result, this may mean that you are not getting adequate blood flow to your heart during exercise. Additional testing may be needed to understand why your test was abnormal. LET [facility] CARE PROVIDER KNOW ABOUT:   Any allergies you have.  All medicines you are taking, including vitamins, herbs, eye drops, creams, and over-the-counter medicines.  Previous problems you or members of your family have had with the use of  anesthetics.  Any blood disorders you have.  Previous surgeries you have had.  Medical conditions you have.  Possibility of pregnancy, if this applies. RISKS AND COMPLICATIONS Generally, this is a safe procedure. However, as with any procedure, complications can occur. Possible complications can include:  Pain or pressure in the following areas:  Chest.  Jaw or neck.  Between your shoulder blades.  Radiating down your left arm.  Dizziness or light-headedness.  Shortness of breath.  Increased or irregular heartbeats.  Nausea or vomiting.  Heart attack (rare). BEFORE THE PROCEDURE  Avoid all forms of caffeine 24 hours before your test or as directed by your health care provider. This includes coffee, tea (even decaffeinated tea), caffeinated sodas, chocolate, cocoa, and certain pain medicines.  Follow your health care provider's instructions regarding eating and drinking before the test.  Take your medicines as directed at regular times with water unless instructed otherwise. Exceptions may include:  If you have diabetes, ask how you are to take your insulin or pills. It is common to adjust insulin dosing the morning of the test.  If you are taking beta-blocker medicines, it is important to talk to your health care provider about these medicines well before the date of your test. Taking beta-blocker medicines may interfere with the test. In some cases, these medicines need to be changed or stopped 24 hours or more before the test.  If you wear a nitroglycerin patch, it may need to be removed prior to the test. Ask your health care provider if the patch should be removed before the test.  If you use an inhaler  for any breathing condition, bring it with you to the test.  If you are an outpatient, bring a snack so you can eat right after the stress phase of the test.  Do not smoke for 4 hours prior to the test or as directed by your health care provider.  Do not apply  lotions, powders, creams, or oils on your chest prior to the test.  Wear loose-fitting clothes and comfortable shoes for the test. This test involves walking on a treadmill. PROCEDURE  Multiple patches (electrodes) will be put on your chest. If needed, small areas of your chest may have to be shaved to get better contact with the electrodes. Once the electrodes are attached to your body, multiple wires will be attached to the electrodes and your heart rate will be monitored.  Your heart will be monitored both at rest and while exercising.  You will walk on a treadmill. The treadmill will be started at a slow pace. The treadmill speed and incline will gradually be increased to raise your heart rate. AFTER THE PROCEDURE  Your heart rate and blood pressure will be monitored after the test.  You may return to your normal schedule including diet, activities, and medicines, unless your health care provider tells you otherwise. Document Released: 11/11/2000 Document Revised: 11/19/2013 Document Reviewed: 07/22/2013 [facility] Patient Information 2015 [employer], [place]. This information is not intended to replace advice given to you by your health care provider. Make sure you discuss any questions you have with your health care provider.

## 2015-06-09 NOTE — Assessment & Plan Note (Signed)
Most likely this is due to sinus tachycardia driven by asthma. However, an associated tachycardia arrhythmia cannot be completely excluded. I requested a 48-hour Holter monitor.

## 2015-06-09 NOTE — Progress Notes (Signed)
Primary care physician: Dr. Carlynn Purl  HPI  This is a pleasant 29 year old female accompanied by her mother and an interpreter, who was referred by Dr. Dema Severin for evaluation of chest pain and palpitations. The patient has no previous cardiac history and does not know her family history as she was adopted. 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 Multiple treatments for asthma have been tried. However, she continues to complain of substernal chest tightness with activities with associated palpitations and tachycardia. She feels that she has to take a breath through a straw. There is no orthopnea, PND or leg edema. She has occasional dizziness but no syncope or presyncope. She had an echocardiogram done in November of last year which showed was completely normal.  Allergies  Allergen Reactions  . Symbicort [Budesonide-Formoterol Fumarate] Anaphylaxis and Other (See Comments)    Chest pain  . Gabapentin Nausea And Vomiting  . Diclofenac Other (See Comments)  . Duloxetine Hcl Other (See Comments)  . Montelukast Sodium Other (See Comments)  . Mupirocin Other (See Comments)  . Naproxen Other (See Comments)  . Sulfa Antibiotics Hives     Current Outpatient Prescriptions on File Prior to Visit  Medication Sig Dispense Refill  . albuterol (PROVENTIL HFA;VENTOLIN HFA) 108 (90 BASE) MCG/ACT inhaler Inhale 2 puffs into the lungs every 4 (four) hours as needed for wheezing or shortness of breath. 6.7 g 5  . Ascorbic Acid (VITAMIN C) 1000 MG tablet Take 1,000 mg by mouth daily.    . Cyanocobalamin (RA VITAMIN B-12 TR) 1000 MCG TBCR Take by mouth.    . cyclobenzaprine (FLEXERIL) 10 MG tablet Take 10 mg by mouth daily as needed for muscle spasms.    . drospirenone-ethinyl estradiol (LORYNA) 3-0.02 MG tablet Take 1 tablet by mouth daily.    . ferrous sulfate 325 (65 FE) MG tablet Take 1 tablet by mouth daily.      . Fluticasone-Salmeterol (ADVAIR DISKUS) 500-50 MCG/DOSE AEPB Inhale 1 puff into the lungs 2 (two) times daily. 60 each 5  . Milk Thistle 500 MG CAPS Take 1 capsule by mouth daily as needed.    . Multiple Vitamin (MULTI-VITAMINS) TABS Take 1 tablet by mouth daily as needed.    . predniSONE (DELTASONE) 5 MG tablet Take 1 tablet (5 mg total) by mouth daily with breakfast. 30 tablet 5  . traMADol (ULTRAM) 50 MG tablet Take 50 mg by mouth every 8 (eight) hours as needed.    . triamcinolone cream (KENALOG) 0.1 % Apply 1 application topically 2 (two) times daily. 80 g 0  . Turmeric 450 MG CAPS Take 1 capsule by mouth daily.     No current facility-administered medications on file prior to visit.     Past Medical History  Diagnosis Date  . Hyperglycemia   . Obesity   . GERD (gastroesophageal reflux disease)   . Fibromyalgia   . Paresthesia   . Fatigue   . B12 deficiency   . Moderate asthma   . Bell's palsy     right sided  . Headache   . Deaf   . Premature birth   . Fatty liver   . High triglycerides      Past Surgical History  Procedure Laterality Date  . Bartholin gland cyst removal    . Laparoscopy    . Myringotomy with tube placement    . Cochlear implant    . Cochlear implant removal  Family History  Problem Relation Age of Onset  . Adopted: Yes     History   Social History  . Marital Status: Single    Spouse Name: N/A  . Number of Children: N/A  . Years of Education: N/A   Occupational History  . Not on file.   Social History Main Topics  . Smoking status: Former Smoker -- 1.00 packs/day for 2 years    Types: Cigarettes    Start date: 11/28/2004    Quit date: 11/28/2006  . Smokeless tobacco: Never Used  . Alcohol Use: 0.0 oz/week    0 Standard drinks or equivalent per week     Comment: rarely  . Drug Use: No  . Sexual Activity: Not Currently   Other Topics Concern  . Not on file   Social History Narrative     ROS A 10 point review  of system was performed. It is negative other than that mentioned in the history of present illness.   PHYSICAL EXAM   BP 124/90 mmHg  Pulse 95  Ht 5\' 3"  (1.6 m)  Wt 183 lb 8 oz (83.235 kg)  BMI 32.51 kg/m2 Constitutional: She is oriented to person, place, and time. She appears well-developed and well-nourished. No distress.  HENT: No nasal discharge.  Head: Normocephalic and atraumatic.  Eyes: Pupils are equal and round. No discharge.  Neck: Normal range of motion. Neck supple. No JVD present. No thyromegaly present.  Cardiovascular: Normal rate, regular rhythm, normal heart sounds. Exam reveals no gallop and no friction rub. No murmur heard.  Pulmonary/Chest: Effort normal and breath sounds normal. No stridor. No respiratory distress. She has no wheezes. She has no rales. She exhibits no tenderness.  Abdominal: Soft. Bowel sounds are normal. She exhibits no distension. There is no tenderness. There is no rebound and no guarding.  Musculoskeletal: Normal range of motion. She exhibits no edema and no tenderness.  Neurological: She is alert and oriented to person, place, and time. Coordination normal.  Skin: Skin is warm and dry. No rash noted. She is not diaphoretic. No erythema. No pallor.  Psychiatric: She has a normal mood and affect. Her behavior is normal. Judgment and thought content normal.     ZOX:WRUEAEKG:Sinus  Rhythm  Low voltage in precordial leads.   ABNORMAL     ASSESSMENT AND PLAN

## 2015-06-10 ENCOUNTER — Encounter: Payer: Self-pay | Admitting: Obstetrics and Gynecology

## 2015-06-10 ENCOUNTER — Ambulatory Visit (INDEPENDENT_AMBULATORY_CARE_PROVIDER_SITE_OTHER): Payer: Medicare Other | Admitting: Obstetrics and Gynecology

## 2015-06-10 VITALS — BP 120/89 | HR 92 | Ht 63.0 in | Wt 185.1 lb

## 2015-06-10 DIAGNOSIS — Z975 Presence of (intrauterine) contraceptive device: Secondary | ICD-10-CM | POA: Diagnosis not present

## 2015-06-10 LAB — POCT URINE PREGNANCY: Preg Test, Ur: NEGATIVE

## 2015-06-10 MED ORDER — LEVONORGESTREL 20 MCG/24HR IU IUD: 1.0000 | INTRAUTERINE_SYSTEM | Freq: Once | INTRAUTERINE | Status: DC

## 2015-06-10 NOTE — Patient Instructions (Signed)

## 2015-06-10 NOTE — Progress Notes (Signed)
Patient ID: Yesenia Wood, female   DOB: 06/11/1986, 29 y.o.   MRN: [id number]  Yesenia Wood is a 29 y.o. year old No obstetric history on file. Caucasian female who presents for placement of a Mirena IUD.  No LMP recorded. Patient is not currently having periods (Reason: Oral contraceptives). BP 120/89 mmHg  Pulse 92  Ht 5\' 3"  (1.6 m)  Wt 185 lb 1.6 oz (83.961 kg)  BMI 32.80 kg/m2   The risks and benefits of the method and placement have been thouroughly reviewed with the patient and all questions were answered.  Specifically the patient is aware of failure rate of 11/998, expulsion of the IUD and of possible perforation.  The patient is aware of irregular bleeding due to the method and understands the incidence of irregular bleeding diminishes with time.  Signed copy of informed consent in chart.   Time out was performed.  A pederson speculum was placed in the vagina.  The cervix was visualized, prepped using Betadine, and grasped with a single tooth tenaculum. The uterus was found to be retroflexed and it sounded to 8 cm.  Mirena IUD placed per manufacturer's recommendations.   The strings were trimmed to 3 cm.  The patient was given post procedure instructions, including signs and symptoms of infection and to check for the strings after each menses or each month, and refraining from intercourse or anything in the vagina for 3 days.  She was given a Mirena care card with date Mirena placed, and date Mirena to be removed.    Cheryl Chay Elissa LovettN Burr, CNM

## 2015-06-11 DIAGNOSIS — J301 Allergic rhinitis due to pollen: Secondary | ICD-10-CM | POA: Diagnosis not present

## 2015-06-15 DIAGNOSIS — J301 Allergic rhinitis due to pollen: Secondary | ICD-10-CM | POA: Diagnosis not present

## 2015-06-18 ENCOUNTER — Ambulatory Visit (INDEPENDENT_AMBULATORY_CARE_PROVIDER_SITE_OTHER): Payer: Medicare Other

## 2015-06-18 ENCOUNTER — Ambulatory Visit: Payer: Medicare Other | Admitting: Cardiovascular Disease

## 2015-06-18 DIAGNOSIS — J301 Allergic rhinitis due to pollen: Secondary | ICD-10-CM | POA: Diagnosis not present

## 2015-06-18 DIAGNOSIS — R002 Palpitations: Secondary | ICD-10-CM

## 2015-06-22 DIAGNOSIS — J301 Allergic rhinitis due to pollen: Secondary | ICD-10-CM | POA: Diagnosis not present

## 2015-06-23 ENCOUNTER — Ambulatory Visit (INDEPENDENT_AMBULATORY_CARE_PROVIDER_SITE_OTHER): Payer: Medicare Other

## 2015-06-23 ENCOUNTER — Ambulatory Visit: Payer: Medicare Other | Admitting: Internal Medicine

## 2015-06-23 DIAGNOSIS — M797 Fibromyalgia: Secondary | ICD-10-CM | POA: Diagnosis not present

## 2015-06-23 DIAGNOSIS — R079 Chest pain, unspecified: Secondary | ICD-10-CM

## 2015-06-23 DIAGNOSIS — R000 Tachycardia, unspecified: Secondary | ICD-10-CM | POA: Diagnosis not present

## 2015-06-23 DIAGNOSIS — R531 Weakness: Secondary | ICD-10-CM | POA: Diagnosis not present

## 2015-06-23 DIAGNOSIS — M79601 Pain in right arm: Secondary | ICD-10-CM | POA: Diagnosis not present

## 2015-06-23 DIAGNOSIS — R200 Anesthesia of skin: Secondary | ICD-10-CM | POA: Diagnosis not present

## 2015-06-24 ENCOUNTER — Other Ambulatory Visit: Payer: Self-pay | Admitting: Neurology

## 2015-06-24 ENCOUNTER — Encounter: Payer: Self-pay | Admitting: Internal Medicine

## 2015-06-24 ENCOUNTER — Ambulatory Visit (INDEPENDENT_AMBULATORY_CARE_PROVIDER_SITE_OTHER): Payer: Medicare Other | Admitting: Internal Medicine

## 2015-06-24 VITALS — BP 128/74 | HR 99 | Temp 98.4°F | Ht 63.0 in | Wt 184.0 lb

## 2015-06-24 DIAGNOSIS — J4540 Moderate persistent asthma, uncomplicated: Secondary | ICD-10-CM | POA: Diagnosis not present

## 2015-06-24 DIAGNOSIS — R200 Anesthesia of skin: Secondary | ICD-10-CM

## 2015-06-24 DIAGNOSIS — R202 Paresthesia of skin: Secondary | ICD-10-CM

## 2015-06-24 DIAGNOSIS — M79601 Pain in right arm: Secondary | ICD-10-CM

## 2015-06-24 LAB — CBC WITH DIFFERENTIAL/PLATELET
BASOS ABS: 0 10*3/uL (ref 0.0–0.2)
Basos: 0 %
EOS (ABSOLUTE): 0.1 10*3/uL (ref 0.0–0.4)
Eos: 1 %
HEMOGLOBIN: 14.1 g/dL (ref 11.1–15.9)
Hematocrit: 43.2 % (ref 34.0–46.6)
Immature Grans (Abs): 0 10*3/uL (ref 0.0–0.1)
Immature Granulocytes: 0 %
LYMPHS ABS: 4.7 10*3/uL — AB (ref 0.7–3.1)
Lymphs: 34 %
MCH: 27.9 pg (ref 26.6–33.0)
MCHC: 32.6 g/dL (ref 31.5–35.7)
MCV: 86 fL (ref 79–97)
MONOS ABS: 1.2 10*3/uL — AB (ref 0.1–0.9)
Monocytes: 8 %
Neutrophils Absolute: 7.8 10*3/uL — ABNORMAL HIGH (ref 1.4–7.0)
Neutrophils: 57 %
Platelets: 423 10*3/uL — ABNORMAL HIGH (ref 150–379)
RBC: 5.05 x10E6/uL (ref 3.77–5.28)
RDW: 13.6 % (ref 12.3–15.4)
WBC: 13.8 10*3/uL — AB (ref 3.4–10.8)

## 2015-06-24 LAB — TSH: TSH: 3.74 u[IU]/mL (ref 0.450–4.500)

## 2015-06-24 NOTE — Patient Instructions (Addendum)
Follow up with Dr. Dema Severin in 3 months - we will start weaning you off prednisone - for the next 2 weeks take  daily (1 pill), then the following 2 weeks take 2.5mg  (1/2 tab), then stop - cont with allergy shot  - cont with allergen avoidance - the cardiologist will call you with results.  - at your next visit we will consider weaning your advair dose.

## 2015-06-24 NOTE — Progress Notes (Signed)
MRN# [id number] Yesenia Wood May 25, 1986   CC: Chief Complaint  Patient presents with  . Follow-up    Pt reports asthma the same; allergy inj bid (Mon)(Thurs).Pt saw Dr. Kirke Corin had stress test and holter monitor. BP was sl elevated and heart rate .      Brief History: 10/07/2014 History: She is a pleasant 29 year old female accompanied by her mother and interpreter, seen in consultation for worsening asthma and shortness of breath, referred by Dr. Carlynn Purl.  History per the patient and review of medical records Patient is a past medical history of deafness, status post cochlear implant with failure, fibromyalgia, B12 deficiency, moderate asthma, obesity. Her asthma history is as follows: As a teenager exercise-induced bronchospasms that was treated with as needed albuterol, and her early 57s started having more shortness of breath and was previously on Qvar. Triggers: Unsure, mild exacerbation with seasonal changes, more shortness of breath with warm weather (this is very mild). Patient cannot identify any inciting factor 2-3 months ago that made her breathing more difficult. She had a spirometry done at her previous pulmonologist in [place], 07/11/2013, FEV1 79, FEV1/FVC 74. Her repeat spirometry at [facility] showed FEV1 48%, FEV1/FVC 66% on 09/02/2014. There has been no significantrespiratory illness in the past year the patient or her mother can identify a significant drop in her FEV1 and worsening of her symptoms. 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 She previously smoked one to 2 packs per day for 2 years and quit 8 years ago. She denies any E. Cigarettes or water-based tobacco products. Patient has a dog, Yorkie, that stay her,she states that she is very clean, does not have a lot of carpeting, no roaches or rodent infestations in her home. 2 months ago she was diagnosed with a urinary tract  infection, shortly after that chest tightness and difficulty breathing, went to the ER, had a negative d-dimer, will was told she had chest wall discomfort and was given a prednisone taper for shortness of breath. Upon completion of her prednisone taper she develop right-sided facial Bell's palsy. She has also had testing for Lyme, which was negative. On 09/16/2014 her primary care physician changed her controller medication to Advair 250/50, from Qvar, she is also on Ventolin, which she is currently using 3-4 times per day for chest tightness and shortness of breath: She states this does not provide significant relief. She denies fever, chills. 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 She could not recall any tick bites. Patient states that she has a peak flow meter at home and uses it every morning, currently she is averaging about 300 for the past one month, which is the yellow zone for her. Patient denies any cough, runny nose, mucus production.  PLAN: - CT Chest, IgE and IgG level, alpha1-antitrypsin, cardiac ECHO (bubble studies)   ROV 11/12/14 Patient presents today for a follow up visit of asthma (uncontrolled) Advair increased at last visit, no significant improvement.  At this point still with sob, and chest tightness, using albuterol 2-3 times a day for chest tightness and wheezing and "vibration" in chest.  Currently seeing ENT (Dr. 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 She would also like to discuss the results of her labs and imaging studies. Mother present at today's  visit.  Plan - mild asthma exacerbation - Prednisone taper over 1 month, advair, prn albuterol   ROV 03/04/15 Since her last visit she has had a 1 month trial of prednisone, which greatly improved her symptoms (chest tightness and sob). She stated that she complete steroid last week,  and this week the symptoms are back, she is currently using albuterol 2-3 times per day; previously on steroid maybe 1-2 times per day. Her family is planning to remove the rugs\carpet and get hypoallergenic pillows and sheets, due to dust mite allergy.  She has seen ENT (Dr. Andee Poles) and was told that her vocal cords were fine and was told that she had dust mite allergy. She had RAST testing done.  She has not had allergy testing since childhood.  Plan - allergy testing [facility] [place] (Dr. Maple Hudson), prednisone 5mg  daily, follow up with ENT  ROV 05/14/15 Darcel presents today for a follow up visit of her asthma, which has been difficult to control. Her asthma she is currently on Advair 500/50, prednisone 5 mg daily, and as needed albuterol. She states that she's currently using albuterol 3-4 times a day for the past 2-3 weeks. At this time her triggers are heat and rapid weather changes. Since her last visit she has seen ENT, Dr. Andee Poles who stated that she had severe allergies to pollen, dust mites, grass/ragweed. She is currently receiving allergy shots 2 times per week. Also she has stopped pulmonary rehabilitation due to not providing any benefit. She was complaining of right sided stomach pain and flank pain,along with constipation to her primary care physician, which prompted an ultrasound of her abdomen; ultrasound yielded fatty liver. Today she is complaining of chest tightness, and some shortness of breath, but this is tolerable for her. Over the last 2-3 months she states that she's also complaining of chest pain/chest discomfort, she states that her chest feels tight and she becomes diaphoretic and very short winded. When this occurs she uses her albuterol, but it does not help much. This chest discomfort has increased over the last 3 months.  Per her visit with Dr. Maple Hudson April 2016: Allergy skin testing 03/04/15- Strong Positive esp for trees and dust mites, some molds.  Plan -  cont with asthma regiment, cardiology evaluation.   Events since last clinic visit: Patient presents for follow up visit of her asthma. Still with cough and shortness of breath at times. Currently getting allergy shots,twice weekly, this is not maintenance dose per the mother. Patient is accompanied by mother and sign language interpreter.   Still on prednisone and allergy shots. Patient had holter monitoring (48 hours) and treadmill stress test performed by Cardiology, results are pending.    Medication:   Current Outpatient Rx  Name  Route  Sig  Dispense  Refill  . albuterol (PROVENTIL HFA;VENTOLIN HFA) 108 (90 BASE) MCG/ACT inhaler   Inhalation   Inhale 2 puffs into the lungs every 4 (four) hours as needed for wheezing or shortness of breath.   6.7 g   5   . Ascorbic Acid (VITAMIN C) 1000 MG tablet   Oral   Take 1,000 mg by mouth daily.         . Cyanocobalamin (RA VITAMIN B-12 TR) 1000 MCG TBCR   Oral   Take by mouth.         . cyclobenzaprine (FLEXERIL) 10 MG tablet   Oral   Take 10 mg by mouth daily as needed for muscle spasms.         Marland Kitchen  ferrous sulfate 325 (65 FE) MG tablet   Oral   Take 1 tablet by mouth daily.         . Fluticasone-Salmeterol (ADVAIR DISKUS) 500-50 MCG/DOSE AEPB   Inhalation   Inhale 1 puff into the lungs 2 (two) times daily.   60 each   5   . levonorgestrel (MIRENA) 20 MCG/24HR IUD   Intrauterine   1 Intra Uterine Device (1 each total) by Intrauterine route once.   1 each   0   . Milk Thistle 500 MG CAPS   Oral   Take 1 capsule by mouth daily as needed.         . Multiple Vitamin (MULTI-VITAMINS) TABS   Oral   Take 1 tablet by mouth daily as needed.         . predniSONE (DELTASONE) 5 MG tablet   Oral   Take 1 tablet (5 mg total) by mouth daily with breakfast.   30 tablet   5   . traMADol (ULTRAM) 50 MG tablet   Oral   Take 50 mg by mouth every 8 (eight) hours as needed.         . triamcinolone cream (KENALOG)  0.1 %   Topical   Apply 1 application topically 2 (two) times daily.   80 g   0   . Turmeric 450 MG CAPS   Oral   Take 1 capsule by mouth daily.            Review of Systems: Gen:  Denies  fever, sweats, chills HEENT: Denies blurred vision, double vision, ear pain, eye pain, hearing loss, nose bleeds, sore throat Cvc:  No dizziness, chest pain or heaviness Resp:   Admits to: Chronic chest tightness and shortness of breath Gi: Denies swallowing difficulty, stomach pain, nausea or vomiting, diarrhea, constipation, bowel incontinence Gu:  Denies bladder incontinence, burning urine Ext:   No Joint pain, stiffness or swelling Skin: No skin rash, easy bruising or bleeding or hives Endoc:  No polyuria, polydipsia , polyphagia or weight change Other:  All other systems negative  Allergies:  Symbicort; Gabapentin; Nortriptyline; Diclofenac; Duloxetine hcl; Montelukast sodium; Mupirocin; Naproxen; and Sulfa antibiotics  Physical Examination:  VS: BP 128/74 mmHg  Pulse 99  Temp(Src) 98.4 F (36.9 C) (Oral)  Ht 5\' 3"  (1.6 m)  Wt 184 lb (83.462 kg)  BMI 32.60 kg/m2  SpO2 96%  General Appearance: No distress  HEENT: PERRLA, no ptosis, no other lesions noticed Pulmonary:normal breath sounds., diaphragmatic excursion normal.No wheezing, No rales   Cardiovascular:  Normal S1,S2.  No m/r/g.     Abdomen:Exam: Benign, Soft, non-tender, No masses  Skin:   warm, no rashes, no ecchymosis  Extremities: normal, no cyanosis, clubbing, warm with normal capillary refill.       Assessment and Plan: 29 year old female seen in follow-up for moderate to persistent asthma Moderate persistent intrinsic asthma without status asthmaticus without complication Atopic as a small child. Role of environmental atopic triggers now is unclear. Total IgE not high, and reported in vitro test showed only dust mites. Allergy skin testing 03/04/15- Strong Positive esp for trees and dust mites, some  molds.  Currently patient is receiving allergy shots at ENT office. Current regimen is: Advair 500/50, prednisone 5 mg daily, rescue inhaler, allergy shots. At today's visit we discussed starting to wean off prazosin she is currently on allergy shots, we'll wean prednisone 5 mg for the next 2 weeks followed by 2.5 mg for 2 weeks and  then stop.  Still with persistent use of albuterol and a daily basis for chest tightness and discomfort. She did have Holter monitoring testing and stress testing done, results are currently pending Given the environmental changes such as heat, change in season, her asthma has become more difficult to control at this time, but somewhat stable since last visit. Avoidance of triggers is paramount to her quality of life, however this is difficult given that her triggers are mostly environmentally related. I have discussed starting Xolair with ENT, they currently see no contraindication but since she is on suboptimal doses of allergy allergy shots Xolair is not recommended at this time.   Plan: -Continue with current allergy shots and current asthma regimen. -We'll start weaning Advair at next visit - Start prednisone wean as stated above  -Avoid asthma triggers as much as tolerable.        Updated Medication List Outpatient Encounter Prescriptions as of 06/24/2015  Medication Sig  . albuterol (PROVENTIL HFA;VENTOLIN HFA) 108 (90 BASE) MCG/ACT inhaler Inhale 2 puffs into the lungs every 4 (four) hours as needed for wheezing or shortness of breath.  . Ascorbic Acid (VITAMIN C) 1000 MG tablet Take 1,000 mg by mouth daily.  . Cyanocobalamin (RA VITAMIN B-12 TR) 1000 MCG TBCR Take by mouth.  . cyclobenzaprine (FLEXERIL) 10 MG tablet Take 10 mg by mouth daily as needed for muscle spasms.  . ferrous sulfate 325 (65 FE) MG tablet Take 1 tablet by mouth daily.  . Fluticasone-Salmeterol (ADVAIR DISKUS) 500-50 MCG/DOSE AEPB Inhale 1 puff into the lungs 2 (two) times  daily.  Marland Kitchen levonorgestrel (MIRENA) 20 MCG/24HR IUD 1 Intra Uterine Device (1 each total) by Intrauterine route once.  . Milk Thistle 500 MG CAPS Take 1 capsule by mouth daily as needed.  . Multiple Vitamin (MULTI-VITAMINS) TABS Take 1 tablet by mouth daily as needed.  . predniSONE (DELTASONE) 5 MG tablet Take 1 tablet (5 mg total) by mouth daily with breakfast.  . traMADol (ULTRAM) 50 MG tablet Take 50 mg by mouth every 8 (eight) hours as needed.  . triamcinolone cream (KENALOG) 0.1 % Apply 1 application topically 2 (two) times daily.  . Turmeric 450 MG CAPS Take 1 capsule by mouth daily.  . [DISCONTINUED] drospirenone-ethinyl estradiol (LORYNA) 3-0.02 MG tablet Take 1 tablet by mouth daily.   No facility-administered encounter medications on file as of 06/24/2015.    Orders for this visit: No orders of the defined types were placed in this encounter.    Thank  you for the visitation and for allowing  [facility] to assist in the care of your patient. Our recommendations are noted above.  Please contact us if we can be of further service.  Stephanie Acre, MD [facility] Office Number: [phone]

## 2015-06-24 NOTE — Assessment & Plan Note (Signed)
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 Role of environmental atopic triggers now is unclear. Total IgE not high, and reported in vitro test showed only dust mites. Allergy skin testing 03/04/15- Strong Positive esp for trees and dust mites, some molds.  Currently patient is receiving allergy shots at ENT office. Current regimen is: Advair 500/50, prednisone 5 mg daily, rescue inhaler, allergy shots. At today's visit we discussed starting to wean off prazosin she is currently on allergy shots, we'll wean prednisone 5 mg for the next 2 weeks followed by 2.5 mg for 2 weeks and then stop.  Still with persistent use of albuterol and a daily basis for chest tightness and discomfort. She did have Holter monitoring testing and stress testing done, results are currently pending Given the environmental changes such as heat, change in season, her asthma has become more difficult to control at this time, but somewhat stable since last visit. Avoidance of triggers is paramount to her quality of life, however this is difficult given that her triggers are mostly environmentally related. I have discussed starting Xolair with ENT, they currently see no contraindication but since she is on suboptimal doses of allergy allergy shots Xolair is not recommended at this time.   Plan: -Continue with current allergy shots and current asthma regimen. -We'll start weaning Advair at next visit - Start prednisone wean as stated above  -Avoid asthma triggers as much as tolerable.

## 2015-06-25 DIAGNOSIS — J301 Allergic rhinitis due to pollen: Secondary | ICD-10-CM | POA: Diagnosis not present

## 2015-06-25 LAB — EXERCISE TOLERANCE TEST
CSEPPHR: 166 {beats}/min
Estimated workload: 7.2 METS
Exercise duration (min): 6 min
Exercise duration (sec): 12 s
Percent HR: 86 %
Rest HR: 136 {beats}/min

## 2015-06-26 ENCOUNTER — Other Ambulatory Visit: Payer: Self-pay

## 2015-06-26 MED ORDER — DILTIAZEM HCL ER COATED BEADS 120 MG PO CP24
120.0000 mg | ORAL_CAPSULE | Freq: Every day | ORAL | Status: DC
Start: 2015-06-26 — End: 2015-08-25

## 2015-06-28 ENCOUNTER — Encounter: Payer: Self-pay | Admitting: Cardiovascular Disease

## 2015-06-29 ENCOUNTER — Telehealth: Payer: Self-pay

## 2015-06-29 NOTE — Telephone Encounter (Signed)
I have question about new med you gave me to start for heart/blood pressure      I have some swelling in calf, ankle, feet. It felt like i am walking on cushions. They hurt alil too. I feel lightedhead.     But mainly i am concerned about swelling. Is it okay side effect?    Thank you      [facility] [employer] [facility] [employer] Size     Small Medium Large Extra Extra Large    Yesenia Wood  06/28/2015  Patient Email  MRN:  [id number]   Description: 29 year old female  Provider: Iran Ouch, MD  Department: [facility]          Call Documentation     No notes of this type exist for this encounter.     Encounter MyChart Messages     Read Composed From To Subject    Y 06/28/2015 2:38 PM Emi Belfast, MD Non-Urgent Medical Question      Created by     Iran Ouch, MD on 06/28/2015 02:38 PM     Pt started Diltiazem  qd on 7/28 for elevated HR and BP during ETT.

## 2015-06-29 NOTE — Telephone Encounter (Signed)
This is a side effect of her diltiazem. It would depend on how well controlled her asthma is if she should go on a beta blocker in place of this. Perhaps, if her asthma is well controlled she could use a low dose beta blocker, though if not then I would not use this medication. Having never examined the patient it is difficult to say.

## 2015-06-30 ENCOUNTER — Encounter: Payer: Self-pay | Admitting: Cardiovascular Disease

## 2015-06-30 NOTE — Telephone Encounter (Signed)
S/w interpreter services via phone. Left message for pt regarding Ryan Dunn's response and recommendation. Pt to notify us via MyChart regarding how well asthma is controlled.

## 2015-07-01 ENCOUNTER — Telehealth: Payer: Self-pay

## 2015-07-01 NOTE — Telephone Encounter (Signed)
l mom to schedule 48 hour monitor for next Tues. 07/07/2015

## 2015-07-01 NOTE — Telephone Encounter (Signed)
-----   Message from Shon Baton, RN sent at 06/30/2015  5:03 PM EDT ----- Could you pls follow up with her for appt to get 48 hour monitor?   Thx!

## 2015-07-06 ENCOUNTER — Encounter: Payer: Self-pay | Admitting: Cardiovascular Disease

## 2015-07-06 DIAGNOSIS — J301 Allergic rhinitis due to pollen: Secondary | ICD-10-CM | POA: Diagnosis not present

## 2015-07-08 ENCOUNTER — Ambulatory Visit
Admission: RE | Admit: 2015-07-08 | Discharge: 2015-07-08 | Disposition: A | Discharge status: Home / Self Care | Payer: Medicare Other | Source: Ambulatory Visit | Attending: Neurology | Admitting: Neurology

## 2015-07-08 DIAGNOSIS — R202 Paresthesia of skin: Secondary | ICD-10-CM | POA: Diagnosis not present

## 2015-07-08 DIAGNOSIS — R200 Anesthesia of skin: Secondary | ICD-10-CM | POA: Diagnosis not present

## 2015-07-08 DIAGNOSIS — M79601 Pain in right arm: Secondary | ICD-10-CM | POA: Diagnosis not present

## 2015-07-09 DIAGNOSIS — J301 Allergic rhinitis due to pollen: Secondary | ICD-10-CM | POA: Diagnosis not present

## 2015-07-13 DIAGNOSIS — J301 Allergic rhinitis due to pollen: Secondary | ICD-10-CM | POA: Diagnosis not present

## 2015-07-16 DIAGNOSIS — J301 Allergic rhinitis due to pollen: Secondary | ICD-10-CM | POA: Diagnosis not present

## 2015-07-20 ENCOUNTER — Encounter: Payer: Self-pay | Admitting: Family Medicine

## 2015-07-20 ENCOUNTER — Ambulatory Visit (INDEPENDENT_AMBULATORY_CARE_PROVIDER_SITE_OTHER): Payer: Medicare Other | Admitting: Family Medicine

## 2015-07-20 VITALS — BP 122/84 | HR 105 | Temp 98.4°F | Resp 18 | Ht 63.0 in | Wt 185.6 lb

## 2015-07-20 DIAGNOSIS — R002 Palpitations: Secondary | ICD-10-CM

## 2015-07-20 DIAGNOSIS — M797 Fibromyalgia: Secondary | ICD-10-CM

## 2015-07-20 DIAGNOSIS — Z23 Encounter for immunization: Secondary | ICD-10-CM

## 2015-07-20 DIAGNOSIS — R202 Paresthesia of skin: Secondary | ICD-10-CM

## 2015-07-20 DIAGNOSIS — J4540 Moderate persistent asthma, uncomplicated: Secondary | ICD-10-CM

## 2015-07-20 MED ORDER — CYCLOBENZAPRINE HCL 10 MG PO TABS: 10.0000 mg | ORAL_TABLET | Freq: Every day | ORAL | Status: DC | PRN

## 2015-07-20 MED ORDER — TRAMADOL HCL 50 MG PO TABS: 50.0000 mg | ORAL_TABLET | Freq: Three times a day (TID) | ORAL | Status: DC | PRN

## 2015-07-20 NOTE — Progress Notes (Signed)
   07/20/15 1422  Asthma History  Symptoms Daily  Nighttime Awakenings Often--7/wk  Asthma interference with normal activity Some limitations  SABA use (not for EIB) Daily  Risk: Exacerbations requiring oral systemic steroids 0-1 / year  Asthma Severity Moderate Persistent

## 2015-07-20 NOTE — Patient Instructions (Signed)
Fibromyalgia Fibromyalgia is a disorder that is often misunderstood. It is associated with muscular pains and tenderness that comes and goes. It is often associated with fatigue and sleep disturbances. Though it tends to be long-lasting, fibromyalgia is not life-threatening. CAUSES  The exact cause of fibromyalgia is unknown. People with certain gene types are predisposed to developing fibromyalgia and other conditions. Certain factors can play a role as triggers, such as:  Spine disorders.  Arthritis.  Severe injury (trauma) and other physical stressors.  Emotional stressors. SYMPTOMS   The main symptom is pain and stiffness in the muscles and joints, which can vary over time.  Sleep and fatigue problems. Other related symptoms may include:  Bowel and bladder problems.  Headaches.  Visual problems.  Problems with odors and noises.  Depression or mood changes.  Painful periods (dysmenorrhea).  Dryness of the skin or eyes. DIAGNOSIS  There are no specific tests for diagnosing fibromyalgia. Patients can be diagnosed accurately from the specific symptoms they have. The diagnosis is made by determining that nothing else is causing the problems. TREATMENT  There is no cure. Management includes medicines and an active, healthy lifestyle. The goal is to enhance physical fitness, decrease pain, and improve sleep. HOME CARE INSTRUCTIONS   Only take over-the-counter or prescription medicines as directed by your caregiver. Sleeping pills, tranquilizers, and pain medicines may make your problems worse.  Low-impact aerobic exercise is very important and advised for treatment. At first, it may seem to make pain worse. Gradually increasing your tolerance will overcome this feeling.  Learning relaxation techniques and how to control stress will help you. Biofeedback, visual imagery, hypnosis, muscle relaxation, yoga, and meditation are all options.  Anti-inflammatory medicines and  physical therapy may provide short-term help.  Acupuncture or massage treatments may help.  Take muscle relaxant medicines as suggested by your caregiver.  Avoid stressful situations.  Plan a healthy lifestyle. This includes your diet, sleep, rest, exercise, and friends.  Find and practice a hobby you enjoy.  Join a fibromyalgia support group for interaction, ideas, and sharing advice. This may be helpful. SEEK MEDICAL CARE IF:  You are not having good results or improvement from your treatment. FOR MORE INFORMATION  National Fibromyalgia Association: www.fmaware.org Arthritis Foundation: www.arthritis.org Document Released: 11/14/2005 Document Revised: 02/06/2012 Document Reviewed: 02/24/2010 ExitCare Patient Information 2015 [employer]. This information is not intended to replace advice given to you by your health care provider. Make sure you discuss any questions you have with your health care provider.  

## 2015-07-20 NOTE — Progress Notes (Signed)
Name: Yesenia Wood   MRN: [id number]    DOB: [birth year]   Date:07/20/2015       Progress Note  Subjective  Chief Complaint  Chief Complaint  Patient presents with  . Asthma    still having SOB, but is being followed by pulmonologist  . Fibromyalgia    still having body aches and pain  . Numbness    still having down right side, seen Dr. Malvin Johns and they did MRI of neck(which came back normal)  . Tachycardia    HPI   Translator present in the exam room to assist with communication today.  FMS: she continues to have daily pain, affecting her daily activity, ability to sleep well at night, failed or could not tolerated Nortriptyline, Gabapentin, Duloxetine. She is currently taking Flexeril and Tramadol, but still has mental fogginess, fatigue, and daily pain, average 4/10.   Palpitation: she has seen Dr. Kirke Corin, she has a holter monitor , EKG and has been taking Cardizem, on holter HR above 100 in 58% of time, she states she has been taking Cardizem but has not noticed a significant improvement of symptoms   Paresthesia: she has been seeing Dr. Malvin Johns, had MRI c-spine and was negative, same as in 2015.    Asthma:  Sees Dr. Garald Braver, currently taking 2.5 mg of prednisone and he is considering weaning off Advair to lower dose.  She still uses rescue inhaler daily, and affects her sleep at night . She is still seeing ENT for allergy shots.    07/20/15 1422  Asthma History  Symptoms Daily  Nighttime Awakenings Often--7/wk  Asthma interference with normal activity Some limitations  SABA use (not for EIB) Daily  Risk: Exacerbations requiring oral systemic steroids 0-1 / year  Asthma Severity Moderate Persistent     Patient Active Problem List   Diagnosis Date Noted  . Pain in the chest 06/09/2015  . Palpitations 06/09/2015  . Hearing loss 05/16/2015  . B12 deficiency 05/16/2015  . Cochlear implant status 05/16/2015  . Fatty infiltration of liver 05/16/2015  . Fibromyalgia syndrome  05/16/2015  . Gastro-esophageal reflux disease without esophagitis 05/16/2015  . Generalized headache 05/16/2015  . H/O Bell's palsy 05/16/2015  . H/O: depression 05/16/2015  . Hypertriglyceridemia 05/16/2015  . Blood glucose elevated 05/16/2015  . Irregular menstrual cycle 05/16/2015  . Overweight 05/16/2015  . Paresthesia 05/16/2015  . Disorder of labyrinth 05/16/2015  . Dyspnea 10/07/2014  . Moderate persistent intrinsic asthma without status asthmaticus without complication 10/07/2014    Past Surgical History  Procedure Laterality Date  . Bartholin gland cyst removal    . Laparoscopy    . Myringotomy with tube placement    . Cochlear implant    . Cochlear implant removal      Family History  Problem Relation Age of Onset  . Adopted: Yes    Social History   Social History  . Marital Status: Single    Spouse Name: N/A  . Number of Children: N/A  . Years of Education: N/A   Occupational History  . Not on file.   Social History Main Topics  . Smoking status: Former Smoker -- 1.00 packs/day for 2 years    Types: Cigarettes    Quit date: 11/28/2006  . Smokeless tobacco: Never Used  . Alcohol Use: No     Comment: rarely  . Drug Use: No  . Sexual Activity: Yes    Birth Control/ Protection: Pill   Other Topics Concern  . Not on  file   Social History Narrative     Current outpatient prescriptions:  .  albuterol (PROVENTIL HFA;VENTOLIN HFA) 108 (90 BASE) MCG/ACT inhaler, Inhale 2 puffs into the lungs every 4 (four) hours as needed for wheezing or shortness of breath., Disp: 6.7 g, Rfl: 5 .  Ascorbic Acid (VITAMIN C) 1000 MG tablet, Take 1,000 mg by mouth daily., Disp: , Rfl:  .  Cyanocobalamin (RA VITAMIN B-12 TR) 1000 MCG TBCR, Take by mouth., Disp: , Rfl:  .  cyclobenzaprine (FLEXERIL) 10 MG tablet, Take 1 tablet (10 mg total) by mouth daily as needed for muscle spasms., Disp: 180 tablet, Rfl: 1 .  diltiazem (CARDIZEM CD) 120 MG 24 hr capsule, Take 1 capsule  (120 mg total) by mouth daily., Disp: 30 capsule, Rfl: 5 .  ferrous sulfate 325 (65 FE) MG tablet, Take 1 tablet by mouth daily., Disp: , Rfl:  .  Fluticasone-Salmeterol (ADVAIR DISKUS) 500-50 MCG/DOSE AEPB, Inhale 1 puff into the lungs 2 (two) times daily., Disp: 60 each, Rfl: 5 .  levonorgestrel (MIRENA) 20 MCG/24HR IUD, 1 Intra Uterine Device (1 each total) by Intrauterine route once., Disp: 1 each, Rfl: 0 .  Multiple Vitamin (MULTI-VITAMINS) TABS, Take 1 tablet by mouth daily as needed., Disp: , Rfl:  .  predniSONE (DELTASONE) 5 MG tablet, Take 1 tablet (5 mg total) by mouth daily with breakfast., Disp: 30 tablet, Rfl: 5 .  traMADol (ULTRAM) 50 MG tablet, Take 1 tablet (50 mg total) by mouth every 8 (eight) hours as needed., Disp: 90 tablet, Rfl: 2 .  triamcinolone cream (KENALOG) 0.1 %, Apply 1 application topically 2 (two) times daily., Disp: 80 g, Rfl: 0  Allergies  Allergen Reactions  . Symbicort [Budesonide-Formoterol Fumarate] Anaphylaxis and Other (See Comments)    Chest pain  . Gabapentin Nausea And Vomiting  . Nortriptyline     Other reaction(s): Other (See Comments)  . Diclofenac Other (See Comments)  . Duloxetine Hcl Other (See Comments)  . Montelukast Sodium Other (See Comments)  . Mupirocin Other (See Comments)  . Naproxen Other (See Comments)  . Sulfa Antibiotics Hives     ROS  Constitutional: Negative for fever or weight change.  Respiratory:Positive  for cough and shortness of breath.   Cardiovascular: Positive for chest pain or palpitations.  Gastrointestinal: Negative for abdominal pain, no bowel changes.  Musculoskeletal: Negative for gait problem or joint swelling.  Skin: Negative for rash.  Neurological: Negative for dizziness or headache.  No other specific complaints in a complete review of systems (except as listed in HPI above).  Objective  Filed Vitals:   07/20/15 1330  BP: 122/84  Pulse: 105  Temp: 98.4 F (36.9 C)  TempSrc: Oral  Resp: 18   Height: 5\' 3"  (1.6 m)  Weight: 185 lb 9.6 oz (84.188 kg)  SpO2: 96%    Body mass index is 32.89 kg/(m^2).  Physical Exam   Constitutional: Patient appears well-developed and well-nourished. Obese No distress.  HEENT: head atraumatic, normocephalic, pupils equal and reactive to light,  neck supple, throat within normal limits Cardiovascular: Normal rate, regular rhythm and normal heart sounds.  No murmur heard. No BLE edema. Pulmonary/Chest: Effort normal and breath sounds normal. No respiratory distress. Abdominal: Soft.  There is no tenderness. Psychiatric: Patient has a normal mood and affect. behavior is normal. Judgment and thought content normal. Muscular Skeletal: 18 trigger points positive Neurologist: still has paresthesia right arm  Recent Results (from the past 2160 hour(s))  POCT urine pregnancy  Status: None   Collection Time: 06/10/15  3:51 PM  Result Value Ref Range   Preg Test, Ur Negative Negative  TSH     Status: None   Collection Time: 06/23/15  9:32 AM  Result Value Ref Range   TSH 3.740 0.450 - 4.500 uIU/mL  CBC with Differential/Platelet     Status: Abnormal   Collection Time: 06/23/15  9:32 AM  Result Value Ref Range   WBC 13.8 (H) 3.4 - 10.8 x10E3/uL   RBC 5.05 3.77 - 5.28 x10E6/uL   Hemoglobin 14.1 11.1 - 15.9 g/dL   Hematocrit 09.8 11.9 - 46.6 %   MCV 86 79 - 97 fL   MCH 27.9 26.6 - 33.0 pg   MCHC 32.6 31.5 - 35.7 g/dL   RDW 14.7 82.9 - 56.2 %   Platelets 423 (H) 150 - 379 x10E3/uL   Neutrophils 57 %   Lymphs 34 %   Monocytes 8 %   Eos 1 %   Basos 0 %   Neutrophils Absolute 7.8 (H) 1.4 - 7.0 x10E3/uL   Lymphocytes Absolute 4.7 (H) 0.7 - 3.1 x10E3/uL   Monocytes Absolute 1.2 (H) 0.1 - 0.9 x10E3/uL   EOS (ABSOLUTE) 0.1 0.0 - 0.4 x10E3/uL   Basophils Absolute 0.0 0.0 - 0.2 x10E3/uL   Immature Granulocytes 0 %   Immature Grans (Abs) 0.0 0.0 - 0.1 x10E3/uL  Exercise Tolerance Test     Status: None   Collection Time: 06/23/15  9:32 AM   Result Value Ref Range   Rest HR 136 bpm   Rest BP 121/70 mmHg   Exercise duration (min) 6 min   Exercise duration (sec) 12 sec   Estimated workload 7.2 METS   Peak HR 166 bpm   Peak BP 203/83 mmHg   MPHR  bpm   Percent HR 86 %   RPE        PHQ2/9: Depression screen [facility] 2/9 07/20/2015 05/18/2015  Decreased Interest 0 0  Down, Depressed, Hopeless 1 0  PHQ - 2 Score 1 0     Fall Risk: Fall Risk  07/20/2015 05/18/2015  Falls in the past year? Yes Yes  Number falls in past yr: 1 2 or more  Injury with Fall? No No  Risk Factor Category  - High Fall Risk     Assessment & Plan  1. Palpitations Continue Cardizem and follow up with Dr. Kirke Corin, explained that she takes Advair that has a beta-agonist and may be the cause of increased rest heart rate also  2. Fibromyalgia syndrome She has been intolerant to multiple medications, had PT and also seen by Rheumatologist, I will give her more material for her to be better educated about her condition  - cyclobenzaprine (FLEXERIL) 10 MG tablet; Take 1 tablet (10 mg total) by mouth daily as needed for muscle spasms.  Dispense: 180 tablet; Refill: 1 - traMADol (ULTRAM) 50 MG tablet; Take 1 tablet (50 mg total) by mouth every 8 (eight) hours as needed.  Dispense: 90 tablet; Refill: 2  3. Moderate persistent intrinsic asthma without status asthmaticus without complication Continue follow up with Pulmonologist  4. Paresthesia Negative studies so far, seeing Dr. Malvin Johns, neurologis  5. Needs flu shot  - Flu Vaccine QUAD 36+ mos IM

## 2015-07-23 DIAGNOSIS — J301 Allergic rhinitis due to pollen: Secondary | ICD-10-CM | POA: Diagnosis not present

## 2015-07-27 DIAGNOSIS — J301 Allergic rhinitis due to pollen: Secondary | ICD-10-CM | POA: Diagnosis not present

## 2015-07-30 DIAGNOSIS — J301 Allergic rhinitis due to pollen: Secondary | ICD-10-CM | POA: Diagnosis not present

## 2015-08-06 DIAGNOSIS — J301 Allergic rhinitis due to pollen: Secondary | ICD-10-CM | POA: Diagnosis not present

## 2015-08-10 ENCOUNTER — Encounter: Payer: Self-pay | Admitting: Cardiovascular Disease

## 2015-08-10 DIAGNOSIS — J301 Allergic rhinitis due to pollen: Secondary | ICD-10-CM | POA: Diagnosis not present

## 2015-08-13 DIAGNOSIS — J301 Allergic rhinitis due to pollen: Secondary | ICD-10-CM | POA: Diagnosis not present

## 2015-08-17 DIAGNOSIS — J301 Allergic rhinitis due to pollen: Secondary | ICD-10-CM | POA: Diagnosis not present

## 2015-08-20 DIAGNOSIS — J301 Allergic rhinitis due to pollen: Secondary | ICD-10-CM | POA: Diagnosis not present

## 2015-08-21 DIAGNOSIS — J301 Allergic rhinitis due to pollen: Secondary | ICD-10-CM | POA: Diagnosis not present

## 2015-08-24 DIAGNOSIS — J301 Allergic rhinitis due to pollen: Secondary | ICD-10-CM | POA: Diagnosis not present

## 2015-08-25 ENCOUNTER — Encounter: Payer: Self-pay | Admitting: Cardiovascular Disease

## 2015-08-25 ENCOUNTER — Other Ambulatory Visit: Payer: Self-pay

## 2015-08-25 MED ORDER — DILTIAZEM HCL ER COATED BEADS 180 MG PO CP24: 180.0000 mg | ORAL_CAPSULE | Freq: Every day | ORAL | Status: DC

## 2015-08-31 DIAGNOSIS — J301 Allergic rhinitis due to pollen: Secondary | ICD-10-CM | POA: Diagnosis not present

## 2015-09-02 DIAGNOSIS — M797 Fibromyalgia: Secondary | ICD-10-CM | POA: Diagnosis not present

## 2015-09-02 DIAGNOSIS — M79601 Pain in right arm: Secondary | ICD-10-CM | POA: Diagnosis not present

## 2015-09-02 DIAGNOSIS — R531 Weakness: Secondary | ICD-10-CM | POA: Diagnosis not present

## 2015-09-02 DIAGNOSIS — R202 Paresthesia of skin: Secondary | ICD-10-CM | POA: Diagnosis not present

## 2015-09-02 DIAGNOSIS — R200 Anesthesia of skin: Secondary | ICD-10-CM | POA: Diagnosis not present

## 2015-09-03 DIAGNOSIS — J301 Allergic rhinitis due to pollen: Secondary | ICD-10-CM | POA: Diagnosis not present

## 2015-09-04 DIAGNOSIS — J301 Allergic rhinitis due to pollen: Secondary | ICD-10-CM | POA: Diagnosis not present

## 2015-09-07 DIAGNOSIS — J301 Allergic rhinitis due to pollen: Secondary | ICD-10-CM | POA: Diagnosis not present

## 2015-09-10 DIAGNOSIS — J301 Allergic rhinitis due to pollen: Secondary | ICD-10-CM | POA: Diagnosis not present

## 2015-09-11 DIAGNOSIS — J301 Allergic rhinitis due to pollen: Secondary | ICD-10-CM | POA: Diagnosis not present

## 2015-09-14 DIAGNOSIS — J301 Allergic rhinitis due to pollen: Secondary | ICD-10-CM | POA: Diagnosis not present

## 2015-09-17 DIAGNOSIS — J301 Allergic rhinitis due to pollen: Secondary | ICD-10-CM | POA: Diagnosis not present

## 2015-09-21 ENCOUNTER — Encounter: Payer: Self-pay | Admitting: Family Medicine

## 2015-09-21 DIAGNOSIS — J301 Allergic rhinitis due to pollen: Secondary | ICD-10-CM | POA: Diagnosis not present

## 2015-09-24 DIAGNOSIS — J301 Allergic rhinitis due to pollen: Secondary | ICD-10-CM | POA: Diagnosis not present

## 2015-09-28 ENCOUNTER — Encounter: Payer: Self-pay | Admitting: Internal Medicine

## 2015-09-28 ENCOUNTER — Ambulatory Visit (INDEPENDENT_AMBULATORY_CARE_PROVIDER_SITE_OTHER): Payer: Medicare Other | Admitting: Internal Medicine

## 2015-09-28 VITALS — BP 118/62 | HR 89 | Ht 63.0 in | Wt 180.2 lb

## 2015-09-28 DIAGNOSIS — J301 Allergic rhinitis due to pollen: Secondary | ICD-10-CM | POA: Diagnosis not present

## 2015-09-28 DIAGNOSIS — J4540 Moderate persistent asthma, uncomplicated: Secondary | ICD-10-CM

## 2015-09-28 NOTE — Patient Instructions (Addendum)
Follow up with Dr. Dema SeverinMungal in: 3 months -Continue with current dose of Advair -Continue allergy shots -Follow with cardiology recommendations -Avoid allergens/triggers -We will check an IgE and eosinophil level prior to next visit

## 2015-09-28 NOTE — Progress Notes (Signed)
Yesenia Wood Yesenia Wood Consultation      MRN# [id number] Yesenia Wood August 30, 1986   CC: Chief Complaint  Patient presents with  . Follow-up    asthma. pt. states breathing is a little better. increased SOB. occ. wheezing. chest occ. pain/tightness. occ. dry cough.       Brief History: 10/07/2014 History: She is a pleasant 29 year old female accompanied by her mother and interpreter, seen in consultation for worsening asthma and shortness of breath, referred by Dr. Carlynn Purl.  History per the patient and review of medical records Patient is a past medical history of deafness, status post cochlear implant with failure, fibromyalgia, B12 deficiency, moderate asthma, obesity. Her asthma history is as follows: As a teenager exercise-induced bronchospasms that was treated with as needed albuterol, and her early 8s started having more shortness of breath and was previously on Qvar. Triggers: Unsure, mild exacerbation with seasonal changes, more shortness of breath with warm weather (this is very mild). Patient cannot identify any inciting factor 2-3 months ago that made her breathing more difficult. She had a spirometry done at her previous pulmonologist in [place], 07/11/2013, FEV1 79, FEV1/FVC 74. Her repeat spirometry at [facility] showed FEV1 48%, FEV1/FVC 66% on 09/02/2014. There has been no significantrespiratory illness in the past year the patient or her mother can identify a significant drop in her FEV1 and worsening of her symptoms. 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 She previously smoked one to 2 packs per day for 2 years and quit 8 years ago. She denies any E. Cigarettes or water-based tobacco products. Patient has a dog, Yorkie, that stay her,she states that she is very clean, does not have a lot of carpeting, no roaches or rodent infestations in her home. 2 months ago she  was diagnosed with a urinary tract infection, shortly after that chest tightness and difficulty breathing, went to the ER, had a negative d-dimer, will was told she had chest wall discomfort and was given a prednisone taper for shortness of breath. Upon completion of her prednisone taper she develop right-sided facial Bell's palsy. She has also had testing for Lyme, which was negative. On 09/16/2014 her primary care physician changed her controller medication to Advair 250/50, from Qvar, she is also on Ventolin, which she is currently using 3-4 times per day for chest tightness and shortness of breath: She states this does not provide significant relief. She denies fever, chills. 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 She could not recall any tick bites. Patient states that she has a peak flow meter at home and uses it every morning, currently she is averaging about 300 for the past one month, which is the yellow zone for her. Patient denies any cough, runny nose, mucus production.  PLAN: - CT Chest, IgE and IgG level, alpha1-antitrypsin, cardiac ECHO (bubble studies)   ROV 11/12/14 Patient presents today for a follow up visit of asthma (uncontrolled) Advair increased at last visit, no significant improvement.  At this point still with sob, and chest tightness, using albuterol 2-3 times a day for chest tightness and wheezing and "vibration" in chest.  Currently seeing ENT (Dr. 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 She would also like to discuss the results of her labs and imaging studies. Mother  present at today's visit.  Plan - mild asthma exacerbation - Prednisone taper over 1 month, advair, prn albuterol   ROV 03/04/15 Since her last visit she has had a 1 month trial of prednisone, which greatly improved her symptoms (chest tightness and sob). She stated  that she complete steroid last week, and this week the symptoms are back, she is currently using albuterol 2-3 times per day; previously on steroid maybe 1-2 times per day. Her family is planning to remove the rugs\carpet and get hypoallergenic pillows and sheets, due to dust mite allergy.  She has seen ENT (Dr. Andee Poles) and was told that her vocal cords were fine and was told that she had dust mite allergy. She had RAST testing done.  She has not had allergy testing since childhood.  Plan - allergy testing [facility] [place] (Dr. Maple Hudson), prednisone 5mg  daily, follow up with ENT  ROV 05/14/15 [name] presents today for a follow up visit of her asthma, which has been difficult to control. Her asthma she is currently on Advair 500/50, prednisone 5 mg daily, and as needed albuterol. She states that she's currently using albuterol 3-4 times a day for the past 2-3 weeks. At this time her triggers are heat and rapid weather changes. Since her last visit she has seen ENT, Dr. Andee Poles who stated that she had severe allergies to pollen, dust mites, grass/ragweed. She is currently receiving allergy shots 2 times per week. Also she has stopped pulmonary rehabilitation due to not providing any benefit. She was complaining of right sided stomach pain and flank pain,along with constipation to her primary care physician, which prompted an ultrasound of her abdomen; ultrasound yielded fatty liver. Today she is complaining of chest tightness, and some shortness of breath, but this is tolerable for her. Over the last 2-3 months she states that she's also complaining of chest pain/chest discomfort, she states that her chest feels tight and she becomes diaphoretic and very short winded. When this occurs she uses her albuterol, but it does not help much. This chest discomfort has increased over the last 3 months.  Per her visit with Dr. Maple Hudson April 2016: Allergy skin testing 03/04/15- Strong Positive esp for trees  and dust mites, some molds.  Plan - cont with asthma regiment, cardiology evaluation.   ROV 05/2015 Patient presents for follow up visit of her asthma. Still with cough and shortness of breath at times. Currently getting allergy shots,twice weekly, this is not maintenance dose per the mother. Patient is accompanied by mother and sign language interpreter.  Still on prednisone and allergy shots. Patient had holter monitoring (48 hours) and treadmill stress test performed by Cardiology, results are pending.  Plan: -Continue with current allergy shots and current asthma regimen. -We'll start weaning Advair at next visit - Start prednisone wean as stated above  -Avoid asthma triggers as much as tolerable.  Events since last clinic visit: Patient is well known to the pulmonary clinic, has moderate to severe uncontrolled asthma, requiring daily albuterol use. She's had a thorough workup for asthma including cardiac and pulmonary evaluation. Since her last visit she has seen cardiology, they have started her on Cardizem for high blood pressure and tachycardia.  She is accompanied today by her mother and hearing interpreter. Since her last visit she has weaned off prednisone 2.5 mg daily. She states that her breathing is slightly improved since being on Cardizem and becoming a little bit more active in terms of exercise. She still using albuterol daily, but  is down from 5 times per day to 3 times per day. She does not endorse any wheezing but does endorse shortness of breath. She also states that she is increase her level of activity, she tries to walk on a daily basis with her dog. She is getting allergy shots currently not at the maintenance level, but is being followed closely by ENT. -Her primary care physician has started her on valacyclovir for Bell's palsy.    Medication:   Current Outpatient Rx  Name  Route  Sig  Dispense  Refill  . albuterol (PROVENTIL HFA;VENTOLIN HFA) 108 (90  BASE) MCG/ACT inhaler   Inhalation   Inhale 2 puffs into the lungs every 4 (four) hours as needed for wheezing or shortness of breath.   6.7 g   5   . Ascorbic Acid (VITAMIN C) 1000 MG tablet   Oral   Take 1,000 mg by mouth daily.         . Cyanocobalamin (RA VITAMIN B-12 TR) 1000 MCG TBCR   Oral   Take by mouth.         . cyclobenzaprine (FLEXERIL) 10 MG tablet   Oral   Take 1 tablet (10 mg total) by mouth daily as needed for muscle spasms.   180 tablet   1   . diltiazem (CARDIZEM CD) 120 MG 24 hr capsule               . diltiazem (CARDIZEM CD) 180 MG 24 hr capsule   Oral   Take 1 capsule (180 mg total) by mouth daily.   30 capsule   3   . ferrous sulfate 325 (65 FE) MG tablet   Oral   Take 1 tablet by mouth daily.         . Fluticasone-Salmeterol (ADVAIR DISKUS) 500-50 MCG/DOSE AEPB   Inhalation   Inhale 1 puff into the lungs 2 (two) times daily.   60 each   5   . levonorgestrel (MIRENA) 20 MCG/24HR IUD   Intrauterine   1 Intra Uterine Device (1 each total) by Intrauterine route once.   1 each   0   . Multiple Vitamin (MULTI-VITAMINS) TABS   Oral   Take 1 tablet by mouth daily as needed.         . traMADol (ULTRAM) 50 MG tablet   Oral   Take 1 tablet (50 mg total) by mouth every 8 (eight) hours as needed.   90 tablet   2   . triamcinolone cream (KENALOG) 0.1 %   Topical   Apply 1 application topically 2 (two) times daily.   80 g   0   . valACYclovir (VALTREX) 500 MG tablet   Oral   Take by mouth.            Review of Systems  Constitutional: Negative for fever, chills, weight loss, malaise/fatigue and diaphoresis.  HENT: Negative for congestion, ear discharge, ear pain, nosebleeds, sore throat and tinnitus.   Eyes: Negative for blurred vision and double vision.  Respiratory: Positive for shortness of breath. Negative for cough, sputum production, wheezing and stridor.        Baseline shortness of breath especially with exertion,  mildly improving since last visit  Cardiovascular: Negative for chest pain and leg swelling.  Gastrointestinal: Negative for heartburn, nausea and vomiting.  Genitourinary: Negative for dysuria.  Musculoskeletal: Negative for myalgias.  Skin: Negative for itching and rash.  Neurological: Negative for dizziness, weakness and headaches.  Endo/Heme/Allergies: Does  not bruise/bleed easily.  Psychiatric/Behavioral: Negative for depression.      Allergies:  Symbicort; Gabapentin; Nortriptyline; Diclofenac; Duloxetine hcl; Montelukast sodium; Mupirocin; Naproxen; and Sulfa antibiotics  Physical Examination:  VS: BP 118/62 mmHg  Pulse 89  Ht 5\' 3"  (1.6 m)  Wt 180 lb 3.2 oz (81.738 kg)  BMI 31.93 kg/m2  SpO2 98%  General Appearance: No distress  HEENT: PERRLA, no ptosis, no other lesions noticed Pulmonary:normal breath sounds., diaphragmatic excursion normal.No wheezing, No rales   Cardiovascular:  Normal S1,S2.  No m/r/g.     Abdomen:Exam: Benign, Soft, non-tender, No masses  Skin:   warm, no rashes, no ecchymosis  Extremities: normal, no cyanosis, clubbing, warm with normal capillary refill.    Assessment and Plan: 29 year old female seen in follow-up for moderate-severe persistent asthma. Moderate persistent intrinsic asthma without status asthmaticus without complication Atopic as a small child. Role of environmental atopic triggers now is unclear. Total IgE not high, and reported in vitro test showed only dust mites. Allergy skin testing 03/04/15- Strong Positive esp for trees and dust mites, some molds.  Currently patient is receiving allergy shots at ENT office. Current regimen is: Advair 500/50, rescue inhaler, allergy shots. At today's visit she has completely stopped prednisone. Still using albuterol about 2-3 times per day but this is an improvement. We'll plan to check eosinophil and IgE levels prior to next visit  Given the environmental changes such as heat, change in  season, her asthma has become more difficult to control at this time, but somewhat stable since last visit. Avoidance of triggers is paramount to her quality of life, however this is difficult given that her triggers are mostly environmentally related. I have discussed starting Xolair with ENT, they currently see no contraindication but since she is on suboptimal doses of allergy allergy shots Xolair is not recommended at this time.   Plan: -Continue with current allergy shots and current asthma regimen. -We will start weaning Advair once her use of albuterol has decreased -Avoid asthma triggers as much as tolerable.          Updated Medication List Outpatient Encounter Prescriptions as of 09/28/2015  Medication Sig  . albuterol (PROVENTIL HFA;VENTOLIN HFA) 108 (90 BASE) MCG/ACT inhaler Inhale 2 puffs into the lungs every 4 (four) hours as needed for wheezing or shortness of breath.  . Ascorbic Acid (VITAMIN C) 1000 MG tablet Take 1,000 mg by mouth daily.  . Cyanocobalamin (RA VITAMIN B-12 TR) 1000 MCG TBCR Take by mouth.  . cyclobenzaprine (FLEXERIL) 10 MG tablet Take 1 tablet (10 mg total) by mouth daily as needed for muscle spasms.  Marland Kitchen diltiazem (CARDIZEM CD) 120 MG 24 hr capsule   . diltiazem (CARDIZEM CD) 180 MG 24 hr capsule Take 1 capsule (180 mg total) by mouth daily.  . ferrous sulfate 325 (65 FE) MG tablet Take 1 tablet by mouth daily.  . Fluticasone-Salmeterol (ADVAIR DISKUS) 500-50 MCG/DOSE AEPB Inhale 1 puff into the lungs 2 (two) times daily.  Marland Kitchen levonorgestrel (MIRENA) 20 MCG/24HR IUD 1 Intra Uterine Device (1 each total) by Intrauterine route once.  . Multiple Vitamin (MULTI-VITAMINS) TABS Take 1 tablet by mouth daily as needed.  . traMADol (ULTRAM) 50 MG tablet Take 1 tablet (50 mg total) by mouth every 8 (eight) hours as needed.  . triamcinolone cream (KENALOG) 0.1 % Apply 1 application topically 2 (two) times daily.  . valACYclovir (VALTREX) 500 MG tablet Take by  mouth.  . [DISCONTINUED] predniSONE (DELTASONE) 5 MG tablet Take  1 tablet (5 mg total) by mouth daily with breakfast. (Patient not taking: Reported on 09/28/2015)   No facility-administered encounter medications on file as of 09/28/2015.    Orders for this visit: No orders of the defined types were placed in this encounter.    Thank  you for the visitation and for allowing  [facility] to assist in the care of your patient. Our recommendations are noted above.  Please contact us if we can be of further service.  Stephanie Acre, MD [facility] Office Number: [phone]  Note: This note was prepared with Dragon dictation along with smaller phrase technology. Any transcriptional errors that result from this process are unintentional.

## 2015-09-28 NOTE — Addendum Note (Signed)
Addended by: Maxwell MarionBLANKENSHIP, MARGIE A on: 09/28/2015 11:01 AM   Modules accepted: Orders

## 2015-09-28 NOTE — Assessment & Plan Note (Signed)
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 Role of environmental atopic triggers now is unclear. Total IgE not high, and reported in vitro test showed only dust mites. Allergy skin testing 03/04/15- Strong Positive esp for trees and dust mites, some molds.  Currently patient is receiving allergy shots at ENT office. Current regimen is: Advair 500/50, rescue inhaler, allergy shots. At today's visit she has completely stopped prednisone. Still using albuterol about 2-3 times per day but this is an improvement. We'll plan to check eosinophil and IgE levels prior to next visit  Given the environmental changes such as heat, change in season, her asthma has become more difficult to control at this time, but somewhat stable since last visit. Avoidance of triggers is paramount to her quality of life, however this is difficult given that her triggers are mostly environmentally related. I have discussed starting Xolair with ENT, they currently see no contraindication but since she is on suboptimal doses of allergy allergy shots Xolair is not recommended at this time.   Plan: -Continue with current allergy shots and current asthma regimen. -We will start weaning Advair once her use of albuterol has decreased -Avoid asthma triggers as much as tolerable.

## 2015-09-29 ENCOUNTER — Encounter: Payer: Self-pay | Admitting: Family Medicine

## 2015-10-01 ENCOUNTER — Ambulatory Visit (INDEPENDENT_AMBULATORY_CARE_PROVIDER_SITE_OTHER): Payer: Medicare Other | Admitting: Family Medicine

## 2015-10-01 ENCOUNTER — Encounter: Payer: Self-pay | Admitting: Family Medicine

## 2015-10-01 VITALS — BP 124/82 | HR 105 | Temp 98.5°F | Resp 14 | Ht 63.0 in | Wt 179.4 lb

## 2015-10-01 DIAGNOSIS — K625 Hemorrhage of anus and rectum: Secondary | ICD-10-CM | POA: Diagnosis not present

## 2015-10-01 DIAGNOSIS — G510 Bell's palsy: Secondary | ICD-10-CM | POA: Diagnosis not present

## 2015-10-01 DIAGNOSIS — J301 Allergic rhinitis due to pollen: Secondary | ICD-10-CM | POA: Diagnosis not present

## 2015-10-01 LAB — POC HEMOCCULT BLD/STL (OFFICE/1-CARD/DIAGNOSTIC): FECAL OCCULT BLD: NEGATIVE

## 2015-10-01 NOTE — Progress Notes (Signed)
Name: Yesenia Wood   MRN: [id number]    DOB: 1986-04-17   Date:10/01/2015       Progress Note  Subjective  Chief Complaint  Chief Complaint  Patient presents with  . Blood In Stools    onset this tuesday 1 episode after a bowel movement on the toliet paper and in the toliet bowl.  She also noticed on her underwear and thought it was a period coming on but when she wiped it was not coming from there.   Interpreter in the room also her mother  HPI  Rectal Bleeding: she has one episode of bright blood per rectum 2 days ago.  It happened during her first bowel of the day, followed by 5 loose stools that day.  She has a history of constipation. She denies straining. No fever. She saw some mucus in stools this morning. Bowel movement is back to normal, she was worried that it was secondary to Valacyclovir given by neurologist for symptoms of Bells Palsy. She has Mirena, and states it did not come from her vagina. She has had a colonoscopy a few years ago in [place] - per patient it was normal   Patient Active Problem List   Diagnosis Date Noted  . Pain in the chest 06/09/2015  . Palpitations 06/09/2015  . Hearing loss 05/16/2015  . B12 deficiency 05/16/2015  . Cochlear implant status 05/16/2015  . Fatty infiltration of liver 05/16/2015  . Fibromyalgia syndrome 05/16/2015  . Gastro-esophageal reflux disease without esophagitis 05/16/2015  . Generalized headache 05/16/2015  . H/O Bell's palsy 05/16/2015  . H/O: depression 05/16/2015  . Hypertriglyceridemia 05/16/2015  . Blood glucose elevated 05/16/2015  . Irregular menstrual cycle 05/16/2015  . Overweight 05/16/2015  . Paresthesia 05/16/2015  . Disorder of labyrinth 05/16/2015  . Dyspnea 10/07/2014  . Moderate persistent intrinsic asthma without status asthmaticus without complication 10/07/2014    Past Surgical History  Procedure Laterality Date  . Bartholin gland cyst removal    . Laparoscopy    . Myringotomy with tube  placement    . Cochlear implant    . Cochlear implant removal      Family History  Problem Relation Age of Onset  . Adopted: Yes    Social History   Social History  . Marital Status: Single    Spouse Name: N/A  . Number of Children: N/A  . Years of Education: N/A   Occupational History  . Not on file.   Social History Main Topics  . Smoking status: Former Smoker -- 1.00 packs/day for 2 years    Types: Cigarettes    Quit date: 11/28/2006  . Smokeless tobacco: Never Used  . Alcohol Use: No     Comment: rarely  . Drug Use: No  . Sexual Activity: Yes    Birth Control/ Protection: Pill   Other Topics Concern  . Not on file   Social History Narrative     Current outpatient prescriptions:  .  albuterol (PROVENTIL HFA;VENTOLIN HFA) 108 (90 BASE) MCG/ACT inhaler, Inhale 2 puffs into the lungs every 4 (four) hours as needed for wheezing or shortness of breath., Disp: 6.7 g, Rfl: 5 .  Cyanocobalamin (RA VITAMIN B-12 TR) 1000 MCG TBCR, Take by mouth., Disp: , Rfl:  .  cyclobenzaprine (FLEXERIL) 10 MG tablet, Take 1 tablet (10 mg total) by mouth daily as needed for muscle spasms., Disp: 180 tablet, Rfl: 1 .  diltiazem (CARDIZEM CD) 180 MG 24 hr capsule, Take 1 capsule (  180 mg total) by mouth daily., Disp: 30 capsule, Rfl: 3 .  Fluticasone-Salmeterol (ADVAIR DISKUS) 500-50 MCG/DOSE AEPB, Inhale 1 puff into the lungs 2 (two) times daily., Disp: 60 each, Rfl: 5 .  levonorgestrel (MIRENA) 20 MCG/24HR IUD, 1 Intra Uterine Device (1 each total) by Intrauterine route once., Disp: 1 each, Rfl: 0 .  Multiple Vitamin (MULTI-VITAMINS) TABS, Take 1 tablet by mouth daily as needed., Disp: , Rfl:  .  traMADol (ULTRAM) 50 MG tablet, Take 1 tablet (50 mg total) by mouth every 8 (eight) hours as needed., Disp: 90 tablet, Rfl: 2 .  triamcinolone cream (KENALOG) 0.1 %, Apply 1 application topically 2 (two) times daily., Disp: 80 g, Rfl: 0 .  valACYclovir (VALTREX) 500 MG tablet, Take by mouth.,  Disp: , Rfl:   Allergies  Allergen Reactions  . Symbicort [Budesonide-Formoterol Fumarate] Anaphylaxis and Other (See Comments)    Chest pain  . Gabapentin Nausea And Vomiting  . Nortriptyline     Other reaction(s): Other (See Comments)  . Diclofenac Other (See Comments)  . Duloxetine Hcl Other (See Comments)  . Montelukast Sodium Other (See Comments)  . Mupirocin Other (See Comments)  . Naproxen Other (See Comments)  . Sulfa Antibiotics Hives     ROS  Bells palsy is returning, she has body aches from FMS, no significant weight change, no urinary symptoms  Objective  Filed Vitals:   10/01/15 1342  BP: 124/82  Pulse: 105  Temp: 98.5 F (36.9 C)  TempSrc: Oral  Resp: 14  Height: 5\' 3"  (1.6 m)  Weight: 179 lb 6.4 oz (81.375 kg)  SpO2: 97%    Body mass index is 31.79 kg/(m^2).  Physical Exam  Constitutional: Patient appears well-developed and well-nourished. Obese  No distress.  HEENT: head atraumatic, normocephalic, pupils equal and reactive to light,  neck supple, throat within normal limits Cardiovascular: Normal rate, regular rhythm and normal heart sounds.  No murmur heard. No BLE edema. Pulmonary/Chest: Effort normal and breath sounds normal. No respiratory distress. Abdominal: Soft. Normal bowel sounds. There is no tenderness. Normal rectal exam, hemoccult negative in the office, brown stools. Normal external anal exam Psychiatric: Patient has a normal mood and affect. behavior is normal. Judgment and thought content normal. Neurological: during her visit, right side of face - Bell's palsy keeps recurring , waxing and waning through the day. Seeing Dr. Malvin JohnsPotter  PH[facility]2/9: [facility] 10/01/2015 07/20/2015 05/18/2015  Decreased Interest 0 0 0  Down, Depressed, Hopeless 0 1 0  PHQ - 2 Score 0 1 0    Fall Risk: Fall Risk  10/01/2015 07/20/2015 05/18/2015  Falls in the past year? Yes Yes Yes  Number falls in past yr: 2 or more 1 2 or more  Injury with  Fall? No No No  Risk Factor Category  - - High Fall Risk   Functional Status Survey: Is the patient deaf or have difficulty hearing?: Yes (deaf) Does the patient have difficulty seeing, even when wearing glasses/contacts?: Yes (colntacts/glasses) Does the patient have difficulty concentrating, remembering, or making decisions?: No Does the patient have difficulty walking or climbing stairs?: No Does the patient have difficulty dressing or bathing?: No Does the patient have difficulty doing errands alone such as visiting a doctor's office or shopping?: No    Assessment & Plan  1. Rectal bleeding  - POC Hemoccult Bld/Stl (1-Cd Office Dx)  She may have internal hemorrhoids, that collapsed during the exam. Discussed options, we will try monitoring for now, if  recurrence we will either refer to GI or check stools . She agrees, she will call back if abdominal cramping, weight loss, increase in mucus in stools, recurrence of rectal bleeding.   2. Right-sided Bell's palsy  Seeing Dr. Malvin Johns, advised to resume Acyclovir since not likely to have been the cause of rectal bleeding

## 2015-10-05 DIAGNOSIS — J301 Allergic rhinitis due to pollen: Secondary | ICD-10-CM | POA: Diagnosis not present

## 2015-10-08 DIAGNOSIS — J301 Allergic rhinitis due to pollen: Secondary | ICD-10-CM | POA: Diagnosis not present

## 2015-10-15 DIAGNOSIS — J301 Allergic rhinitis due to pollen: Secondary | ICD-10-CM | POA: Diagnosis not present

## 2015-10-26 DIAGNOSIS — J301 Allergic rhinitis due to pollen: Secondary | ICD-10-CM | POA: Diagnosis not present

## 2015-10-27 ENCOUNTER — Other Ambulatory Visit: Payer: Self-pay | Admitting: *Deleted

## 2015-10-27 DIAGNOSIS — R0600 Dyspnea, unspecified: Secondary | ICD-10-CM

## 2015-10-27 MED ORDER — FLUTICASONE-SALMETEROL 500-50 MCG/DOSE IN AEPB: 1.0000 | INHALATION_SPRAY | Freq: Two times a day (BID) | RESPIRATORY_TRACT | Status: DC

## 2015-10-28 ENCOUNTER — Telehealth: Payer: Self-pay

## 2015-10-28 MED ORDER — DILTIAZEM HCL ER COATED BEADS 180 MG PO CP24: 180.0000 mg | ORAL_CAPSULE | Freq: Every day | ORAL | Status: DC

## 2015-10-28 NOTE — Telephone Encounter (Signed)
Diltiazem 180 mg Rx sent for 90 day supply to local pharmacy 10/28/2015 @ 1:03 pm.

## 2015-10-28 NOTE — Telephone Encounter (Signed)
Needs 90 day supply 

## 2015-10-29 ENCOUNTER — Encounter: Payer: Self-pay | Admitting: Family Medicine

## 2015-10-29 ENCOUNTER — Ambulatory Visit (INDEPENDENT_AMBULATORY_CARE_PROVIDER_SITE_OTHER): Payer: Medicare Other | Admitting: Family Medicine

## 2015-10-29 VITALS — BP 122/78 | HR 99 | Temp 98.6°F | Resp 16 | Ht 63.0 in | Wt 176.9 lb

## 2015-10-29 DIAGNOSIS — E538 Deficiency of other specified B group vitamins: Secondary | ICD-10-CM | POA: Diagnosis not present

## 2015-10-29 DIAGNOSIS — J301 Allergic rhinitis due to pollen: Secondary | ICD-10-CM | POA: Diagnosis not present

## 2015-10-29 DIAGNOSIS — R202 Paresthesia of skin: Secondary | ICD-10-CM | POA: Diagnosis not present

## 2015-10-29 DIAGNOSIS — K760 Fatty (change of) liver, not elsewhere classified: Secondary | ICD-10-CM

## 2015-10-29 DIAGNOSIS — R739 Hyperglycemia, unspecified: Secondary | ICD-10-CM | POA: Diagnosis not present

## 2015-10-29 DIAGNOSIS — K219 Gastro-esophageal reflux disease without esophagitis: Secondary | ICD-10-CM | POA: Diagnosis not present

## 2015-10-29 DIAGNOSIS — D72829 Elevated white blood cell count, unspecified: Secondary | ICD-10-CM | POA: Diagnosis not present

## 2015-10-29 DIAGNOSIS — J069 Acute upper respiratory infection, unspecified: Secondary | ICD-10-CM

## 2015-10-29 DIAGNOSIS — E781 Pure hyperglyceridemia: Secondary | ICD-10-CM | POA: Diagnosis not present

## 2015-10-29 DIAGNOSIS — J4541 Moderate persistent asthma with (acute) exacerbation: Secondary | ICD-10-CM

## 2015-10-29 DIAGNOSIS — Z79899 Other long term (current) drug therapy: Secondary | ICD-10-CM | POA: Diagnosis not present

## 2015-10-29 MED ORDER — PREDNISONE 10 MG PO TABS: 10.0000 mg | ORAL_TABLET | Freq: Every day | ORAL | Status: DC

## 2015-10-29 NOTE — Progress Notes (Signed)
Name: Yesenia Wood   MRN: [id number]    DOB: 12/01/1985   Date:10/29/2015       Progress Note  Subjective  Chief Complaint  Chief Complaint  Patient presents with  . Medication Refill    follow-up  . Asthma  . Fibromyalgia  . Abdominal Pain    stomach pains since last visit stomach hurts after she eats, and still having dirrhea  . URI    1 week cough, sob and wheezing  . Fibromyalgia    unchanged still having pain    HPI  Interpretor with her today   Asthma Moderate with exacerbation: still seeing Mungal, having allergy shots. She has not noticed any improvement on symptoms. She states over the past week she has developed headache, sore throat, followed by productive cough and worsening of wheezing, having to use rescue inhaler more often since. She feels that the cough is getting worse.   FMS: she continues to have daily pain, she takes medication prn, pain right now is 5-6/10 and she hurts all over. She continues to have some mental fogginess. Pain seems to be worse on her leg and hips right now.   Abdominal pain: she has noticed some RUQ pain and diarrhea with meals. She had an US in May showing fatty liver but no gallstones. She is following a low fat diet. She denies acholic stools, jaundice or itching. Symptoms going on for the past few weeks. Symptoms have improved.  Hyper triglycerides: on diet only, low fat and low sugar diet. She is due for repeat labs.  Hyperglycemia: denies polyphagia or polyuria. She feels very thirsty at times.   B12: out of supplements, feeling tired  GERD: doing very well, symptoms of heartburn maybe once a month now, no regurgitation  Paresthesia: seen by neurologist, she has intermittent paresthesia on both arms and occasionally on right side of the body. Stable.   Patient Active Problem List   Diagnosis Date Noted  . Pain in the chest 06/09/2015  . Palpitations 06/09/2015  . Hearing loss 05/16/2015  . B12 deficiency 05/16/2015  .  Cochlear implant status 05/16/2015  . Fatty infiltration of liver 05/16/2015  . Fibromyalgia syndrome 05/16/2015  . Gastro-esophageal reflux disease without esophagitis 05/16/2015  . Generalized headache 05/16/2015  . Right-sided Bell's palsy 05/16/2015  . H/O: depression 05/16/2015  . Hypertriglyceridemia 05/16/2015  . Blood glucose elevated 05/16/2015  . Irregular menstrual cycle 05/16/2015  . Overweight 05/16/2015  . Paresthesia 05/16/2015  . Disorder of labyrinth 05/16/2015  . Dyspnea 10/07/2014  . Moderate persistent intrinsic asthma without status asthmaticus without complication 10/07/2014    Past Surgical History  Procedure Laterality Date  . Bartholin gland cyst removal    . Laparoscopy    . Myringotomy with tube placement    . Cochlear implant    . Cochlear implant removal      Family History  Problem Relation Age of Onset  . Adopted: Yes    Social History   Social History  . Marital Status: Single    Spouse Name: N/A  . Number of Children: N/A  . Years of Education: N/A   Occupational History  . Not on file.   Social History Main Topics  . Smoking status: Former Smoker -- 1.00 packs/day for 2 years    Types: Cigarettes    Quit date: 11/28/2006  . Smokeless tobacco: Never Used  . Alcohol Use: No     Comment: rarely  . Drug Use: No  . Sexual  Activity: Yes    Birth Control/ Protection: Pill   Other Topics Concern  . Not on file   Social History Narrative     Current outpatient prescriptions:  .  albuterol (PROVENTIL HFA;VENTOLIN HFA) 108 (90 BASE) MCG/ACT inhaler, Inhale 2 puffs into the lungs every 4 (four) hours as needed for wheezing or shortness of breath., Disp: 6.7 g, Rfl: 5 .  Cyanocobalamin (RA VITAMIN B-12 TR) 1000 MCG TBCR, Take by mouth., Disp: , Rfl:  .  cyclobenzaprine (FLEXERIL) 10 MG tablet, Take 1 tablet (10 mg total) by mouth daily as needed for muscle spasms., Disp: 180 tablet, Rfl: 1 .  diltiazem (CARDIZEM CD) 180 MG 24 hr  capsule, Take 1 capsule (180 mg total) by mouth daily., Disp: 90 capsule, Rfl: 3 .  Fluticasone-Salmeterol (ADVAIR DISKUS) 500-50 MCG/DOSE AEPB, Inhale 1 puff into the lungs 2 (two) times daily., Disp: 60 each, Rfl: 5 .  levonorgestrel (MIRENA) 20 MCG/24HR IUD, 1 Intra Uterine Device (1 each total) by Intrauterine route once., Disp: 1 each, Rfl: 0 .  Multiple Vitamin (MULTI-VITAMINS) TABS, Take 1 tablet by mouth daily as needed., Disp: , Rfl:  .  traMADol (ULTRAM) 50 MG tablet, Take 1 tablet (50 mg total) by mouth every 8 (eight) hours as needed., Disp: 90 tablet, Rfl: 2 .  triamcinolone cream (KENALOG) 0.1 %, Apply 1 application topically 2 (two) times daily., Disp: 80 g, Rfl: 0  Allergies  Allergen Reactions  . Symbicort [Budesonide-Formoterol Fumarate] Anaphylaxis and Other (See Comments)    Chest pain  . Gabapentin Nausea And Vomiting  . Nortriptyline     Other reaction(s): Other (See Comments)  . Diclofenac Other (See Comments)  . Duloxetine Hcl Other (See Comments)  . Montelukast Sodium Other (See Comments)  . Mupirocin Other (See Comments)  . Naproxen Other (See Comments)  . Sulfa Antibiotics Hives     ROS  Constitutional: Negative for fever or weight change.  Respiratory: Positive  for cough and shortness of breath.   Cardiovascular: Negative for chest pain or palpitations.  Gastrointestinal: Positive  for abdominal pain and bowel changes.  Musculoskeletal: Negative for gait problem or joint swelling.  Skin: Negative for rash.  Neurological: Negative for dizziness , she has intermittent  headaches.  No other specific complaints in a complete review of systems (except as listed in HPI above).  Objective  Filed Vitals:   10/29/15 1001  BP: 122/78  Pulse: 99  Temp: 98.6 F (37 C)  TempSrc: Oral  Resp: 16  Height:  (1.6 m)  Weight: 176 lb 14.4 oz (80.241 kg)  SpO2: 96%    Body mass index is 31.34 kg/(m^2).  Physical Exam  Constitutional: Patient appears  well-developed and well-nourished. Obese  No distress.  HEENT: head atraumatic, normocephalic, pupils equal and reactive to lightneck supple, throat within normal limits Cardiovascular: Normal rate, regular rhythm and normal heart sounds.  No murmur heard. No BLE edema. Pulmonary/Chest: Effort normal and breath sounds normal. No respiratory distress. Abdominal: Soft.  Mild low abdominal pain, no guarding or rebound. Psychiatric: Patient has a normal mood and affect. behavior is normal. Judgment and thought content normal. Muscular Skeletal: tender trigger points, no synovitis. She has medial wrist pain, seems to be tendinitis - she will return if gets worse  Recent Results (from the past 2160 hour(s))  POC Hemoccult Bld/Stl (1-Cd Office Dx)     Status: Normal   Collection Time: 10/01/15  3:14 PM  Result Value Ref Range   Card #  1 Date 10/01/2015    Fecal Occult Blood, POC Negative Negative    PHQ2/9: Depression screen [facility] 2/9 10/01/2015 07/20/2015 05/18/2015  Decreased Interest 0 0 0  Down, Depressed, Hopeless 0 1 0  PHQ - 2 Score 0 1 0     Fall Risk: Fall Risk  10/01/2015 07/20/2015 05/18/2015  Falls in the past year? Yes Yes Yes  Number falls in past yr: 2 or more 1 2 or more  Injury with Fall? No No No  Risk Factor Category  - - High Fall Risk     Assessment & Plan  1. Moderate persistent intrinsic asthma with exacerbation  Continue follow up with Pulmonologist, and medication  -prednisone 10 mg twice daily for 5 days and call back if needed.   2. B12 deficiency  Needs to resume otc medication  - Vitamin B12  3. Fatty infiltration of liver  It may be the cause of her symptoms of abdominal pain, but likely from a GI symptoms if persists she needs to call back for referral to GI  4. Gastro-esophageal reflux disease without esophagitis  Discussed life style changes  5. Paresthesia  Stable, keep follow up with neurologist  6. Upper respiratory infection  Improving,  but caused asthma exacerbation  7. Hypertriglyceridemia  - Lipid panel  8. Blood glucose elevated  - Hemoglobin A1c  9. Long-term use of high-risk medication  - Comprehensive metabolic panel   10. Leukocytosis  We will recheck CBC next visit, since she is sick and will start prednisone today

## 2015-10-30 LAB — COMPREHENSIVE METABOLIC PANEL
A/G RATIO: 1.7 (ref 1.1–2.5)
ALT: 59 IU/L — AB (ref 0–32)
AST: 27 IU/L (ref 0–40)
Albumin: 4.4 g/dL (ref 3.5–5.5)
Alkaline Phosphatase: 83 IU/L (ref 39–117)
BILIRUBIN TOTAL: 0.3 mg/dL (ref 0.0–1.2)
BUN / CREAT RATIO: 9 (ref 8–20)
BUN: 7 mg/dL (ref 6–20)
CHLORIDE: 99 mmol/L (ref 97–106)
CO2: 24 mmol/L (ref 18–29)
Calcium: 9.7 mg/dL (ref 8.7–10.2)
Creatinine, Ser: 0.81 mg/dL (ref 0.57–1.00)
GFR calc non Af Amer: 98 mL/min/{1.73_m2} (ref >59)
GFR, EST AFRICAN AMERICAN: 114 mL/min/{1.73_m2} (ref >59)
Globulin, Total: 2.6 g/dL (ref 1.5–4.5)
Glucose: 87 mg/dL (ref 65–99)
POTASSIUM: 4.3 mmol/L (ref 3.5–5.2)
Sodium: 139 mmol/L (ref 136–144)
TOTAL PROTEIN: 7 g/dL (ref 6.0–8.5)

## 2015-10-30 LAB — VITAMIN B12: VITAMIN B 12: 890 pg/mL (ref 211–946)

## 2015-10-30 LAB — LIPID PANEL
CHOL/HDL RATIO: 4.5 ratio — AB (ref 0.0–4.4)
Cholesterol, Total: 192 mg/dL (ref 100–199)
HDL: 43 mg/dL (ref >39)
LDL CALC: 123 mg/dL — AB (ref 0–99)
TRIGLYCERIDES: 128 mg/dL (ref 0–149)
VLDL Cholesterol Cal: 26 mg/dL (ref 5–40)

## 2015-10-30 LAB — HEMOGLOBIN A1C
Est. average glucose Bld gHb Est-mCnc: 111 mg/dL
Hgb A1c MFr Bld: 5.5 % (ref 4.8–5.6)

## 2015-11-02 DIAGNOSIS — J301 Allergic rhinitis due to pollen: Secondary | ICD-10-CM | POA: Diagnosis not present

## 2015-11-02 NOTE — Addendum Note (Signed)
Addended by: Coralee RudGILLEY, Leyana Whidden F on: 11/02/2015 09:27 AM   Modules accepted: Level of Service, SmartSet

## 2015-11-02 NOTE — Telephone Encounter (Signed)
This encounter was created in error - please disregard.

## 2015-11-03 ENCOUNTER — Encounter: Payer: Self-pay | Admitting: Cardiovascular Disease

## 2015-11-05 DIAGNOSIS — J301 Allergic rhinitis due to pollen: Secondary | ICD-10-CM | POA: Diagnosis not present

## 2015-11-09 DIAGNOSIS — J301 Allergic rhinitis due to pollen: Secondary | ICD-10-CM | POA: Diagnosis not present

## 2015-11-12 DIAGNOSIS — J301 Allergic rhinitis due to pollen: Secondary | ICD-10-CM | POA: Diagnosis not present

## 2015-11-16 DIAGNOSIS — J301 Allergic rhinitis due to pollen: Secondary | ICD-10-CM | POA: Diagnosis not present

## 2015-11-19 DIAGNOSIS — J301 Allergic rhinitis due to pollen: Secondary | ICD-10-CM | POA: Diagnosis not present

## 2015-11-26 DIAGNOSIS — J301 Allergic rhinitis due to pollen: Secondary | ICD-10-CM | POA: Diagnosis not present

## 2015-12-03 DIAGNOSIS — J301 Allergic rhinitis due to pollen: Secondary | ICD-10-CM | POA: Diagnosis not present

## 2015-12-10 DIAGNOSIS — J301 Allergic rhinitis due to pollen: Secondary | ICD-10-CM | POA: Diagnosis not present

## 2015-12-11 DIAGNOSIS — J301 Allergic rhinitis due to pollen: Secondary | ICD-10-CM | POA: Diagnosis not present

## 2015-12-14 DIAGNOSIS — J301 Allergic rhinitis due to pollen: Secondary | ICD-10-CM | POA: Diagnosis not present

## 2015-12-23 ENCOUNTER — Encounter: Payer: Self-pay | Admitting: Family Medicine

## 2015-12-24 ENCOUNTER — Other Ambulatory Visit
Admission: RE | Admit: 2015-12-24 | Discharge: 2015-12-24 | Disposition: A | Discharge status: Home / Self Care | Payer: Medicare Other | Source: Ambulatory Visit | Attending: Internal Medicine | Admitting: Internal Medicine

## 2015-12-24 DIAGNOSIS — J4540 Moderate persistent asthma, uncomplicated: Secondary | ICD-10-CM | POA: Insufficient documentation

## 2015-12-25 LAB — MISC LABCORP TEST (SEND OUT): Labcorp test code: 5298

## 2015-12-25 LAB — IGE: IGE (IMMUNOGLOBULIN E), SERUM: 17 [IU]/mL (ref 0–100)

## 2015-12-28 ENCOUNTER — Ambulatory Visit (INDEPENDENT_AMBULATORY_CARE_PROVIDER_SITE_OTHER): Payer: Medicare Other | Admitting: Internal Medicine

## 2015-12-28 ENCOUNTER — Encounter: Payer: Self-pay | Admitting: Internal Medicine

## 2015-12-28 VITALS — BP 124/80 | HR 85 | Ht 63.0 in | Wt 179.8 lb

## 2015-12-28 DIAGNOSIS — R071 Chest pain on breathing: Secondary | ICD-10-CM

## 2015-12-28 DIAGNOSIS — R0600 Dyspnea, unspecified: Secondary | ICD-10-CM | POA: Diagnosis not present

## 2015-12-28 DIAGNOSIS — R0789 Other chest pain: Secondary | ICD-10-CM

## 2015-12-28 DIAGNOSIS — J4540 Moderate persistent asthma, uncomplicated: Secondary | ICD-10-CM | POA: Diagnosis not present

## 2015-12-28 MED ORDER — AMBULATORY NON FORMULARY MEDICATION: Status: AC

## 2015-12-28 NOTE — Progress Notes (Signed)
[facility] Pulmonary Medicine Consultation      MRN# [id number] BOYD LITAKER 29-Mar-1986   CC: Chief Complaint  Patient presents with  . Follow-up    pt. states breathing is baseline. occ. SOB. occ. dry cough. occ. wheezing. chest pain/tightness X77mo.      Brief History: 10/07/2014 History: She is a pleasant 30 year old female accompanied by her mother and interpreter, seen in consultation for worsening asthma and shortness of breath, referred by Dr. Carlynn Purl.  History per the patient and review of medical records Patient is a past medical history of deafness, status post cochlear implant with failure, fibromyalgia, B12 deficiency, moderate asthma, obesity. Her asthma history is as follows: As a teenager exercise-induced bronchospasms that was treated with as needed albuterol, and her early 24s started having more shortness of breath and was previously on Qvar. Triggers: Unsure, mild exacerbation with seasonal changes, more shortness of breath with warm weather (this is very mild). Patient cannot identify any inciting factor 2-3 months ago that made her breathing more difficult. She had a spirometry done at her previous pulmonologist in [place], 07/11/2013, FEV1 79, FEV1/FVC 74. Her repeat spirometry at [facility] showed FEV1 48%, FEV1/FVC 66% on 09/02/2014. There has been no significantrespiratory illness in the past year the patient or her mother can identify a significant drop in her FEV1 and worsening of her symptoms. 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 She previously smoked one to 2 packs per day for 2 years and quit 8 years ago. She denies any E. Cigarettes or water-based tobacco products. Patient has a dog, Yorkie, that stay her,she states that she is very clean, does not have a lot of carpeting, no roaches or rodent infestations in her home. 2 months ago she was diagnosed with a  urinary tract infection, shortly after that chest tightness and difficulty breathing, went to the ER, had a negative d-dimer, will was told she had chest wall discomfort and was given a prednisone taper for shortness of breath. Upon completion of her prednisone taper she develop right-sided facial Bell's palsy. She has also had testing for Lyme, which was negative. On 09/16/2014 her primary care physician changed her controller medication to Advair 250/50, from Qvar, she is also on Ventolin, which she is currently using 3-4 times per day for chest tightness and shortness of breath: She states this does not provide significant relief. She denies fever, chills. 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 She could not recall any tick bites. Patient states that she has a peak flow meter at home and uses it every morning, currently she is averaging about 300 for the past one month, which is the yellow zone for her. Patient denies any cough, runny nose, mucus production.  PLAN: - CT Chest, IgE and IgG level, alpha1-antitrypsin, cardiac ECHO (bubble studies)   ROV 11/12/14 Patient presents today for a follow up visit of asthma (uncontrolled) Advair increased at last visit, no significant improvement.  At this point still with sob, and chest tightness, using albuterol 2-3 times a day for chest tightness and wheezing and "vibration" in chest.  Currently seeing ENT (Dr. 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 She would also like to discuss the results of her labs and imaging studies. Mother present at today's visit.  Plan - mild asthma exacerbation - Prednisone taper over 1 month, advair, prn albuterol   ROV 03/04/15 Since her last visit she has had a 1 month trial of prednisone, which greatly improved her symptoms (chest tightness and sob). She stated that she complete  steroid last week, and this week the symptoms are back, she is currently using albuterol 2-3 times per day; previously on steroid maybe 1-2 times per day. Her family is planning to remove the rugs\carpet and get hypoallergenic pillows and sheets, due to dust mite allergy.  She has seen ENT (Dr. Andee Poles) and was told that her vocal cords were fine and was told that she had dust mite allergy. She had RAST testing done.  She has not had allergy testing since childhood.  Plan - allergy testing [facility] [place] (Dr. Maple Hudson), prednisone 5mg  daily, follow up with ENT  ROV 05/14/15 Yesenia Wood presents today for a follow up visit of her asthma, which has been difficult to control. Her asthma she is currently on Advair 500/50, prednisone 5 mg daily, and as needed albuterol. She states that she's currently using albuterol 3-4 times a day for the past 2-3 weeks. At this time her triggers are heat and rapid weather changes. Since her last visit she has seen ENT, Dr. Andee Poles who stated that she had severe allergies to pollen, dust mites, grass/ragweed. She is currently receiving allergy shots 2 times per week. Also she has stopped pulmonary rehabilitation due to not providing any benefit. She was complaining of right sided stomach pain and flank pain,along with constipation to her primary care physician, which prompted an ultrasound of her abdomen; ultrasound yielded fatty liver. Today she is complaining of chest tightness, and some shortness of breath, but this is tolerable for her. Over the last 2-3 months she states that she's also complaining of chest pain/chest discomfort, she states that her chest feels tight and she becomes diaphoretic and very short winded. When this occurs she uses her albuterol, but it does not help much. This chest discomfort has increased over the last 3 months.  Per her visit with Dr. Maple Hudson April 2016: Allergy skin testing 03/04/15- Strong Positive esp for trees and dust mites, some  molds.  Plan - cont with asthma regiment, cardiology evaluation.   ROV 05/2015 Patient presents for follow up visit of her asthma. Still with cough and shortness of breath at times. Currently getting allergy shots,twice weekly, this is not maintenance dose per the mother. Patient is accompanied by mother and sign language interpreter.  Still on prednisone and allergy shots. Patient had holter monitoring (48 hours) and treadmill stress test performed by Cardiology, results are pending.  Plan: -Continue with current allergy shots and current asthma regimen. -We'll start weaning Advair at next visit - Start prednisone wean as stated above  -Avoid asthma triggers as much as tolerable.  08/2015: Patient is well known to the pulmonary clinic, has moderate to severe uncontrolled asthma, requiring daily albuterol use. She's had a thorough workup for asthma including cardiac and pulmonary evaluation. Since her last visit she has seen cardiology, they have started her on Cardizem for high blood pressure and tachycardia.  She is accompanied today by her mother and hearing interpreter. Since her last visit she has weaned off prednisone 2.5 mg daily. She states that her breathing is slightly improved since being on Cardizem and becoming a little bit more active in terms of exercise. She still using albuterol daily, but is down from 5 times per day to 3  times per day. She does not endorse any wheezing but does endorse shortness of breath. She also states that she is increase her level of activity, she tries to walk on a daily basis with her dog. She is getting allergy shots currently not at the maintenance level, but is being followed closely by ENT. -Her primary care physician has started her on valacyclovir for Bell's palsy. Plan: -Continue with current allergy shots and current asthma regimen. -We will start weaning Advair once her use of albuterol has decreased -Avoid asthma triggers as much  as tolerable. Events since last clinic visit: Patient presents today for follow-up visit of her asthma. She is accompanied by her interpreter and her mother. Review of records shows that in December 2016 she was placed on a five-day steroid pack for upper respiratory symptoms. Since her last visit with pulmonary she has follow-up with cardiology for palpitations/tachycardia. She is was started on diltiazem. Currently following neurology for Bell's Palsy Review of records also show that she's been having vague upper respiratory tract type symptoms for which she addressed with her primary care physician, these were attributed to a possible viral infection.  today she complains of a chronic shortness of breath with exertion, she states that she is using albuterol 2-3 times per week (this is similar to her previous visits). She also states with deep breathing she has some mild chest tenderness. Had a short course of prednisone in mid December 2016 with possible viral syndrome. Marland Kitchen Her allergy shot are currently on hold due to her recent illness, to date she is not seeing much benefit from it (started around summer 2016)  Medication:   Current Outpatient Rx  Name  Route  Sig  Dispense  Refill  . albuterol (PROVENTIL HFA;VENTOLIN HFA) 108 (90 BASE) MCG/ACT inhaler   Inhalation   Inhale 2 puffs into the lungs every 4 (four) hours as needed for wheezing or shortness of breath.   6.7 g   5   . Cyanocobalamin (RA VITAMIN B-12 TR) 1000 MCG TBCR   Oral   Take by mouth.         . cyclobenzaprine (FLEXERIL) 10 MG tablet   Oral   Take 1 tablet (10 mg total) by mouth daily as needed for muscle spasms.   180 tablet   1   . diltiazem (CARDIZEM CD) 180 MG 24 hr capsule   Oral   Take 1 capsule (180 mg total) by mouth daily.   90 capsule   3   . Fluticasone-Salmeterol (ADVAIR DISKUS) 500-50 MCG/DOSE AEPB   Inhalation   Inhale 1 puff into the lungs 2 (two) times daily.   60 each   5   .  levonorgestrel (MIRENA) 20 MCG/24HR IUD   Intrauterine   1 Intra Uterine Device (1 each total) by Intrauterine route once.   1 each   0   . Multiple Vitamin (MULTI-VITAMINS) TABS   Oral   Take 1 tablet by mouth daily as needed.         . traMADol (ULTRAM) 50 MG tablet   Oral   Take 1 tablet (50 mg total) by mouth every 8 (eight) hours as needed.   90 tablet   2   . triamcinolone cream (KENALOG) 0.1 %   Topical   Apply 1 application topically 2 (two) times daily.   80 g   0   . AMBULATORY NON FORMULARY MEDICATION      Medication Name: Areochamber Use as directed  1 each   0   . AMBULATORY NON FORMULARY MEDICATION      Medication Name: Incentive Spirometry  Use as directed   1 each   0      Review of Systems  Constitutional: Negative for fever, chills, weight loss, malaise/fatigue and diaphoresis.  HENT: Negative for congestion, ear discharge, ear pain, nosebleeds, sore throat and tinnitus.   Eyes: Negative for blurred vision and double vision.  Respiratory: Positive for shortness of breath. Negative for cough, sputum production, wheezing and stridor.        Baseline shortness of breath especially with exertion Chest wall tenderness with light palpation over the sternal area  Cardiovascular: Negative for chest pain and leg swelling.  Gastrointestinal: Negative for heartburn, nausea and vomiting.  Genitourinary: Negative for dysuria.  Musculoskeletal: Negative for myalgias.  Skin: Negative for itching and rash.  Neurological: Negative for dizziness, weakness and headaches.  Endo/Heme/Allergies: Does not bruise/bleed easily.  Psychiatric/Behavioral: Negative for depression.      Allergies:  Symbicort; Gabapentin; Nortriptyline; Diclofenac; Duloxetine hcl; Montelukast sodium; Mupirocin; Naproxen; and Sulfa antibiotics  Physical Examination:  VS: BP 124/80 mmHg  Pulse 85  Ht 5\' 3"  (1.6 m)  Wt 179 lb 12.8 oz (81.557 kg)  BMI 31.86 kg/m2  SpO2 96%    General Appearance: No distress  HEENT: PERRLA, no ptosis, no other lesions noticed Pulmonary:   Shallow breath sounds, poor respiratory effort with deep inspiration, mid chest tenderness over the sternum with mild to mod palpation Cardiovascular:  Normal S1,S2.  No m/r/g.     Abdomen:Exam: Benign, Soft, non-tender, No masses  Skin:   warm, no rashes, no ecchymosis  Extremities: normal, no cyanosis, clubbing, warm with normal capillary refill.    Assessment and Plan: 30 year old female seen in follow-up for moderate-severe persistent asthma. Costochondral chest pain Due to recent viral syndrome  Plan - incentive spirometry - Ibuprofen 600mg , TID x 3 days, then stop   Moderate persistent intrinsic asthma without status asthmaticus without complication Atopic as a small child. Role of environmental atopic triggers now is unclear. Total IgE not high, and reported in vitro test showed only dust mites. Allergy skin testing 03/04/15- Strong Positive esp for trees and dust mites, some molds.  Currently patient is receiving allergy shots at ENT office. Current regimen is: Advair 500/50, rescue inhaler, allergy shots. At today's visit she has completely stopped prednisone. Still using albuterol about 2-3 times per day but this is an improvement. IgE= 17, Abs eosinophil = 0.1 >>both normal  Given the environmental changes such as heat, change in season, her asthma has become more difficult to control at this time, but somewhat stable since last visit. Avoidance of triggers is paramount to her quality of life, however this is difficult given that her triggers are mostly environmentally related. I have discussed starting Xolair with ENT, they currently see no contraindication but since she is on suboptimal doses of allergy allergy shots Xolair is not recommended at this time.   Plan: -Advised to resume allergy shots and continue with current asthma regimen.  Gave spacer for use with inhalers and  also gave incentive spirometer.  -We will start weaning Advair once her use of albuterol has decreased -Avoid asthma triggers as much as tolerable.          Dyspnea See plan for asthma.  Also, I believe that with her recent illness and costochondritis there is associated deconditioning, as evident by decrease effort for deep inspiration during the physical exam  Plan - incentive spirometry (patient given device and tutored on proper use). 10-15 times per day - ibuprofen  PO TID for 3 days then stop.  - exercise as tolerated.     Updated Medication List Outpatient Encounter Prescriptions as of 12/28/2015  Medication Sig  . albuterol (PROVENTIL HFA;VENTOLIN HFA) 108 (90 BASE) MCG/ACT inhaler Inhale 2 puffs into the lungs every 4 (four) hours as needed for wheezing or shortness of breath.  . Cyanocobalamin (RA VITAMIN B-12 TR) 1000 MCG TBCR Take by mouth.  . cyclobenzaprine (FLEXERIL) 10 MG tablet Take 1 tablet (10 mg total) by mouth daily as needed for muscle spasms.  Marland Kitchen diltiazem (CARDIZEM CD) 180 MG 24 hr capsule Take 1 capsule (180 mg total) by mouth daily.  . Fluticasone-Salmeterol (ADVAIR DISKUS) 500-50 MCG/DOSE AEPB Inhale 1 puff into the lungs 2 (two) times daily.  Marland Kitchen levonorgestrel (MIRENA) 20 MCG/24HR IUD 1 Intra Uterine Device (1 each total) by Intrauterine route once.  . Multiple Vitamin (MULTI-VITAMINS) TABS Take 1 tablet by mouth daily as needed.  . traMADol (ULTRAM) 50 MG tablet Take 1 tablet (50 mg total) by mouth every 8 (eight) hours as needed.  . triamcinolone cream (KENALOG) 0.1 % Apply 1 application topically 2 (two) times daily.  . AMBULATORY NON FORMULARY MEDICATION Medication Name: Areochamber Use as directed  . AMBULATORY NON FORMULARY MEDICATION Medication Name: Incentive Spirometry  Use as directed  . [DISCONTINUED] predniSONE (DELTASONE) 10 MG tablet Take 1 tablet (10 mg total) by mouth daily with breakfast. (Patient not taking: Reported on 12/28/2015)    No facility-administered encounter medications on file as of 12/28/2015.    Orders for this visit: Orders Placed This Encounter  Procedures  . DG Chest 2 View    Standing Status: Future     Number of Occurrences:      Standing Expiration Date: 02/26/2017    Order Specific Question:  Reason for Exam (SYMPTOM  OR DIAGNOSIS REQUIRED)    Answer:  dyspnea    Order Specific Question:  Preferred imaging location?    Answer:  [facility]    Order Specific Question:  Is the patient pregnant?    Answer:  No    Thank  you for the visitation and for allowing  [facility] to assist in the care of your patient. Our recommendations are noted above.  Please contact us if we can be of further service.  Stephanie Acre, MD [facility] Office Number: [phone]  Note: This note was prepared with Dragon dictation along with smaller phrase technology. Any transcriptional errors that result from this process are unintentional.

## 2015-12-28 NOTE — Patient Instructions (Addendum)
Follow up with Dr. Dema Severin in: 3 months - incentive spirometry 10-15 times daily - CXR, 2 view within the next week for shortness of breath - cont with Advair  - restart allergy shots - ibuprofen (advil/motrin) - take  by mouth every 8 hours for 3 days, then stop - treating costochondritis - albuterol inhaler - 2puff every 3-4 hours as needed for shortness of breath\wheezing\recurrent cough - use spacer with ALL inhalers.

## 2015-12-28 NOTE — Assessment & Plan Note (Addendum)
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 Role of environmental atopic triggers now is unclear. Total IgE not high, and reported in vitro test showed only dust mites. Allergy skin testing 03/04/15- Strong Positive esp for trees and dust mites, some molds.  Currently patient is receiving allergy shots at ENT office. Current regimen is: Advair 500/50, rescue inhaler, allergy shots. At today's visit she has completely stopped prednisone. Still using albuterol about 2-3 times per day but this is an improvement. IgE= 17, Abs eosinophil = 0.1 >>both normal  Given the environmental changes such as heat, change in season, her asthma has become more difficult to control at this time, but somewhat stable since last visit. Avoidance of triggers is paramount to her quality of life, however this is difficult given that her triggers are mostly environmentally related. I have discussed starting Xolair with ENT, they currently see no contraindication but since she is on suboptimal doses of allergy allergy shots Xolair is not recommended at this time.   Plan: -Advised to resume allergy shots and continue with current asthma regimen.  Gave spacer for use with inhalers and also gave incentive spirometer.  -We will start weaning Advair once her use of albuterol has decreased -Avoid asthma triggers as much as tolerable.

## 2015-12-28 NOTE — Assessment & Plan Note (Signed)
Due to recent viral syndrome  Plan - incentive spirometry - Ibuprofen , TID x 3 days, then stop

## 2015-12-29 NOTE — Assessment & Plan Note (Signed)
See plan for asthma.  Also, I believe that with her recent illness and costochondritis there is associated deconditioning, as evident by decrease effort for deep inspiration during the physical exam  Plan - incentive spirometry (patient given device and tutored on proper use). 10-15 times per day - ibuprofen  PO TID for 3 days then stop.  - exercise as tolerated.

## 2015-12-31 ENCOUNTER — Ambulatory Visit
Admission: RE | Admit: 2015-12-31 | Discharge: 2015-12-31 | Disposition: A | Discharge status: Home / Self Care | Payer: Medicare Other | Source: Ambulatory Visit | Attending: Internal Medicine | Admitting: Internal Medicine

## 2015-12-31 DIAGNOSIS — R42 Dizziness and giddiness: Secondary | ICD-10-CM | POA: Insufficient documentation

## 2015-12-31 DIAGNOSIS — R079 Chest pain, unspecified: Secondary | ICD-10-CM | POA: Diagnosis not present

## 2015-12-31 DIAGNOSIS — J301 Allergic rhinitis due to pollen: Secondary | ICD-10-CM | POA: Diagnosis not present

## 2015-12-31 DIAGNOSIS — R0600 Dyspnea, unspecified: Secondary | ICD-10-CM

## 2016-01-01 DIAGNOSIS — J301 Allergic rhinitis due to pollen: Secondary | ICD-10-CM | POA: Diagnosis not present

## 2016-01-04 ENCOUNTER — Telehealth: Payer: Self-pay | Admitting: *Deleted

## 2016-01-04 DIAGNOSIS — J301 Allergic rhinitis due to pollen: Secondary | ICD-10-CM | POA: Diagnosis not present

## 2016-01-04 NOTE — Telephone Encounter (Signed)
-----   Message from Stephanie Acre, MD sent at 01/01/2016 11:53 AM EST ----- Regarding: cxr results Please inform patient that I have reviewed her chest xray, and there are no signs of active lung infections (pneumonia, bronchitis). Chest xray is normal appearing.  Thank you. Dr. Dema Severin

## 2016-01-04 NOTE — Telephone Encounter (Signed)
LM for pt to return call in regards to results.   

## 2016-01-05 NOTE — Telephone Encounter (Signed)
Pt informed of results. Nothing further needed. 

## 2016-01-07 DIAGNOSIS — J301 Allergic rhinitis due to pollen: Secondary | ICD-10-CM | POA: Diagnosis not present

## 2016-01-10 ENCOUNTER — Encounter: Payer: Self-pay | Admitting: Family Medicine

## 2016-01-11 ENCOUNTER — Other Ambulatory Visit: Payer: Self-pay | Admitting: Family Medicine

## 2016-01-11 MED ORDER — CIPROFLOXACIN HCL 250 MG PO TABS: 250.0000 mg | ORAL_TABLET | Freq: Two times a day (BID) | ORAL | Status: DC

## 2016-01-18 DIAGNOSIS — J301 Allergic rhinitis due to pollen: Secondary | ICD-10-CM | POA: Diagnosis not present

## 2016-01-21 ENCOUNTER — Other Ambulatory Visit: Payer: Self-pay | Admitting: Family Medicine

## 2016-01-21 DIAGNOSIS — J301 Allergic rhinitis due to pollen: Secondary | ICD-10-CM | POA: Diagnosis not present

## 2016-01-25 DIAGNOSIS — J301 Allergic rhinitis due to pollen: Secondary | ICD-10-CM | POA: Diagnosis not present

## 2016-01-28 ENCOUNTER — Ambulatory Visit (INDEPENDENT_AMBULATORY_CARE_PROVIDER_SITE_OTHER): Payer: Medicare Other | Admitting: Family Medicine

## 2016-01-28 ENCOUNTER — Encounter: Payer: Self-pay | Admitting: Family Medicine

## 2016-01-28 VITALS — BP 110/78 | HR 106 | Temp 98.8°F | Resp 16 | Ht 63.0 in | Wt 175.0 lb

## 2016-01-28 DIAGNOSIS — R1013 Epigastric pain: Secondary | ICD-10-CM | POA: Diagnosis not present

## 2016-01-28 DIAGNOSIS — R300 Dysuria: Secondary | ICD-10-CM | POA: Diagnosis not present

## 2016-01-28 DIAGNOSIS — G43001 Migraine without aura, not intractable, with status migrainosus: Secondary | ICD-10-CM | POA: Diagnosis not present

## 2016-01-28 LAB — POCT URINALYSIS DIPSTICK
BILIRUBIN UA: NEGATIVE
GLUCOSE UA: NEGATIVE
KETONES UA: NEGATIVE
Leukocytes, UA: NEGATIVE
Nitrite, UA: NEGATIVE
RBC UA: NEGATIVE
SPEC GRAV UA: 1.015
Urobilinogen, UA: 0.2
pH, UA: 6

## 2016-01-28 MED ORDER — RIZATRIPTAN BENZOATE 10 MG PO TABS: 10.0000 mg | ORAL_TABLET | ORAL | Status: DC | PRN

## 2016-01-28 MED ORDER — TOPIRAMATE 50 MG PO TABS
25.0000 mg | ORAL_TABLET | Freq: Two times a day (BID) | ORAL | Status: DC
Start: 2016-01-28 — End: 2016-03-21

## 2016-01-28 MED ORDER — NITROFURANTOIN MONOHYD MACRO 100 MG PO CAPS: 100.0000 mg | ORAL_CAPSULE | Freq: Two times a day (BID) | ORAL | Status: DC

## 2016-01-28 MED ORDER — RANITIDINE HCL 150 MG PO CAPS: 150.0000 mg | ORAL_CAPSULE | Freq: Two times a day (BID) | ORAL | Status: DC

## 2016-01-28 NOTE — Progress Notes (Signed)
Name: Yesenia Wood   MRN: [id number]    DOB: 04/13/1986   Date:01/28/2016       Progress Note  Subjective  Chief Complaint  Chief Complaint  Patient presents with  . Hypertension    some headaches. patient checks it daily at home.  Marland Kitchen GI Problem    patient presents with epigastric pain for about a week or so. patient stated that it is constant. patient stated that it worsens when she moves around. no otc meds taken. patient has been a little more regular since she is normally constipated. no blood, or dark stools. a little softer than normal.    HPI  Migraine Headaches: she has never been diagnosed with migraines in the past. She states that since teenager years she has a history of headaches, that are located in the frontal area, and radiates to the entire head, described as throbbing, daily over the past 2 weeks. Currently pain is 5-6/10 at times worse than that, associated with photophobia but no phonophobia. She has tried Ibuprofen without help. Worse in the morning, improves by the end of the day. Pain does not let her sleep/nap during the day. It causes decrease in appetite, occasionally has nausea but no vomiting.    Abdominal pain: symptoms of epigastric pain started about 4 days ago. Described as a constant gnawing pain, not affected by food, some nausea, she has noticed increase in bowel movements, from skipping days to soft stools two or three times daily, no blood in stools. No vomiting. No sick contacts.   Dysuria: she has noticed some dysuria over the past couple of days, she denies increase in urinary frequency. She has developed nocturia. No fever or back pain. Mild supra pubic discomfort   Patient Active Problem List   Diagnosis Date Noted  . Costochondral chest pain 12/28/2015  . Leukocytosis 10/29/2015  . Pain in the chest 06/09/2015  . Palpitations 06/09/2015  . Hearing loss 05/16/2015  . B12 deficiency 05/16/2015  . Cochlear implant status 05/16/2015  . Fatty  infiltration of liver 05/16/2015  . Fibromyalgia syndrome 05/16/2015  . Gastro-esophageal reflux disease without esophagitis 05/16/2015  . Generalized headache 05/16/2015  . Right-sided Bell's palsy 05/16/2015  . H/O: depression 05/16/2015  . Hypertriglyceridemia 05/16/2015  . Blood glucose elevated 05/16/2015  . Irregular menstrual cycle 05/16/2015  . Overweight 05/16/2015  . Paresthesia 05/16/2015  . Disorder of labyrinth 05/16/2015  . Dyspnea 10/07/2014  . Moderate persistent intrinsic asthma without status asthmaticus without complication 10/07/2014    Past Surgical History  Procedure Laterality Date  . Bartholin gland cyst removal    . Laparoscopy    . Myringotomy with tube placement    . Cochlear implant    . Cochlear implant removal      Family History  Problem Relation Age of Onset  . Adopted: Yes    Social History   Social History  . Marital Status: Single    Spouse Name: N/A  . Number of Children: N/A  . Years of Education: N/A   Occupational History  . Not on file.   Social History Main Topics  . Smoking status: Former Smoker -- 1.00 packs/day for 2 years    Types: Cigarettes    Quit date: 11/28/2006  . Smokeless tobacco: Never Used  . Alcohol Use: No     Comment: rarely  . Drug Use: No  . Sexual Activity: Yes    Birth Control/ Protection: Pill   Other Topics Concern  . Not  on file   Social History Narrative     Current outpatient prescriptions:  .  CRANBERRY EXTRACT PO, Take by mouth., Disp: , Rfl:  .  albuterol (PROVENTIL HFA;VENTOLIN HFA) 108 (90 BASE) MCG/ACT inhaler, Inhale 2 puffs into the lungs every 4 (four) hours as needed for wheezing or shortness of breath., Disp: 6.7 g, Rfl: 5 .  ciprofloxacin (CIPRO) 250 MG tablet, Take 1 tablet (250 mg total) by mouth 2 (two) times daily., Disp: 6 tablet, Rfl: 0 .  Cyanocobalamin (RA VITAMIN B-12 TR) 1000 MCG TBCR, Take by mouth., Disp: , Rfl:  .  cyclobenzaprine (FLEXERIL) 10 MG tablet, Take 1  tablet (10 mg total) by mouth daily as needed for muscle spasms., Disp: 180 tablet, Rfl: 1 .  diltiazem (CARDIZEM CD) 180 MG 24 hr capsule, Take 1 capsule (180 mg total) by mouth daily., Disp: 90 capsule, Rfl: 3 .  Fluticasone-Salmeterol (ADVAIR DISKUS) 500-50 MCG/DOSE AEPB, Inhale 1 puff into the lungs 2 (two) times daily., Disp: 60 each, Rfl: 5 .  levonorgestrel (MIRENA) 20 MCG/24HR IUD, 1 Intra Uterine Device (1 each total) by Intrauterine route once., Disp: 1 each, Rfl: 0 .  Multiple Vitamin (MULTI-VITAMINS) TABS, Take 1 tablet by mouth daily as needed., Disp: , Rfl:  .  traMADol (ULTRAM) 50 MG tablet, TAKE ONE TABLET BY MOUTH EVERY 8 HOURS AS NEEDED, Disp: 90 tablet, Rfl: 1 .  triamcinolone cream (KENALOG) 0.1 %, Apply 1 application topically 2 (two) times daily., Disp: 80 g, Rfl: 0  Allergies  Allergen Reactions  . Symbicort [Budesonide-Formoterol Fumarate] Anaphylaxis and Other (See Comments)    Chest pain  . Gabapentin Nausea And Vomiting  . Nortriptyline     Other reaction(s): Other (See Comments)  . Diclofenac Other (See Comments)  . Duloxetine Hcl Other (See Comments)  . Montelukast Sodium Other (See Comments)  . Mupirocin Other (See Comments)  . Naproxen Other (See Comments)  . Sulfa Antibiotics Hives     ROS  Ten systems reviewed and is negative except as mentioned in HPI   Objective  Filed Vitals:   01/28/16 1431  BP: 110/78  Pulse: 106  Temp: 98.8 F (37.1 C)  TempSrc: Oral  Resp: 16  Height:  (1.6 m)  Weight: 175 lb (79.379 kg)  SpO2: 94%    Body mass index is 31.01 kg/(m^2).  Physical Exam  Constitutional: Patient appears well-developed and well-nourished. Obese No distress.  HEENT: head atraumatic, normocephalic, pupils equal and reactive to light,  neck supple, throat within normal limits Cardiovascular: Normal rate, regular rhythm and normal heart sounds.  No murmur heard. No BLE edema. Pulmonary/Chest: Effort normal and breath sounds normal.  No respiratory distress. Abdominal: Soft.  There is tenderness on epigastric area and also supra pubic area Psychiatric: Patient has a normal mood and affect. behavior is normal. Judgment and thought content normal.  Recent Results (from the past 2160 hour(s))  IgE     Status: None   Collection Time: 12/24/15  2:35 PM  Result Value Ref Range   IgE (Immunoglobulin E), Serum 17 0 - 100 IU/mL    Comment: (NOTE) Performed At: [facility] [address] [place], [place] [id number] Mila Homer MD [id number]   Miscellaneous [facility] test (send-out)     Status: None   Collection Time: 12/24/15  2:35 PM  Result Value Ref Range   [facility] test code [id number]    [facility] test name EOSINOPHIL COUNT    Misc [facility] result COMMENT  Comment: (NOTE) Test Ordered: [id number] Eosinophil Count Eos (Absolute)                 0.1              x10E3/uL BN     Reference Range: 0.0-0.4                               Performed At: [facility] [address] [place], [place] [id number] Mila Homer MD [id number]      PHQ2/9: Depression screen [facility] 2/9 01/28/2016 10/01/2015 07/20/2015 05/18/2015  Decreased Interest 0 0 0 0  Down, Depressed, Hopeless 0 0 1 0  PHQ - 2 Score 0 0 1 0    Fall Risk: Fall Risk  01/28/2016 10/01/2015 07/20/2015 05/18/2015  Falls in the past year? Yes Yes Yes Yes  Number falls in past yr: 1 2 or more 1 2 or more  Injury with Fall? No No No No  Risk Factor Category  - - - High Fall Risk      Functional Status Survey: Is the patient deaf or have difficulty hearing?: Yes Does the patient have difficulty seeing, even when wearing glasses/contacts?: No Does the patient have difficulty concentrating, remembering, or making decisions?: No Does the patient have difficulty walking or climbing stairs?: No Does the patient have difficulty dressing or bathing?: No Does the patient have difficulty doing errands alone such as visiting a doctor's office or shopping?:  No    Assessment & Plan  1. Migraine without aura and with status migrainosus, not intractable  - topiramate (TOPAMAX) 50 MG tablet; Take 0.5-2 tablets (25-100 mg total) by mouth 2 (two) times daily.  Dispense: 60 tablet; Refill: 0 - rizatriptan (MAXALT) 10 MG tablet; Take 1 tablet (10 mg total) by mouth as needed for migraine. May repeat in 2 hours if needed  Dispense: 10 tablet; Refill: 0  2. Dysuria  - Urine culture - POCT Urinalysis Dipstick - nitrofurantoin, macrocrystal-monohydrate, (MACROBID) 100 MG capsule; Take 1 capsule (100 mg total) by mouth 2 (two) times daily.  Dispense: 14 capsule; Refill: 0  3. Epigastric abdominal pain  - ranitidine (ZANTAC) 150 MG capsule; Take 1 capsule (150 mg total) by mouth 2 (two) times daily.  Dispense: 60 capsule; Refill: 0

## 2016-01-29 DIAGNOSIS — R300 Dysuria: Secondary | ICD-10-CM | POA: Diagnosis not present

## 2016-01-30 LAB — URINE CULTURE

## 2016-02-01 DIAGNOSIS — J301 Allergic rhinitis due to pollen: Secondary | ICD-10-CM | POA: Diagnosis not present

## 2016-02-08 DIAGNOSIS — J301 Allergic rhinitis due to pollen: Secondary | ICD-10-CM | POA: Diagnosis not present

## 2016-02-11 DIAGNOSIS — J301 Allergic rhinitis due to pollen: Secondary | ICD-10-CM | POA: Diagnosis not present

## 2016-02-15 DIAGNOSIS — J301 Allergic rhinitis due to pollen: Secondary | ICD-10-CM | POA: Diagnosis not present

## 2016-02-18 DIAGNOSIS — J301 Allergic rhinitis due to pollen: Secondary | ICD-10-CM | POA: Diagnosis not present

## 2016-02-19 ENCOUNTER — Encounter: Payer: Self-pay | Admitting: Cardiovascular Disease

## 2016-02-22 DIAGNOSIS — J301 Allergic rhinitis due to pollen: Secondary | ICD-10-CM | POA: Diagnosis not present

## 2016-02-25 ENCOUNTER — Other Ambulatory Visit: Payer: Self-pay | Admitting: Family Medicine

## 2016-02-29 DIAGNOSIS — J301 Allergic rhinitis due to pollen: Secondary | ICD-10-CM | POA: Diagnosis not present

## 2016-03-02 ENCOUNTER — Ambulatory Visit (INDEPENDENT_AMBULATORY_CARE_PROVIDER_SITE_OTHER): Payer: Medicare Other | Admitting: Physician Assistant

## 2016-03-02 ENCOUNTER — Other Ambulatory Visit
Admission: RE | Admit: 2016-03-02 | Discharge: 2016-03-02 | Disposition: A | Discharge status: Home / Self Care | Payer: Medicare Other | Source: Ambulatory Visit | Attending: Physician Assistant | Admitting: Physician Assistant

## 2016-03-02 ENCOUNTER — Encounter: Payer: Self-pay | Admitting: Physician Assistant

## 2016-03-02 VITALS — BP 140/78 | HR 97 | Ht 63.0 in | Wt 173.8 lb

## 2016-03-02 DIAGNOSIS — R0602 Shortness of breath: Secondary | ICD-10-CM | POA: Diagnosis not present

## 2016-03-02 DIAGNOSIS — R030 Elevated blood-pressure reading, without diagnosis of hypertension: Secondary | ICD-10-CM

## 2016-03-02 DIAGNOSIS — R002 Palpitations: Secondary | ICD-10-CM

## 2016-03-02 DIAGNOSIS — M797 Fibromyalgia: Secondary | ICD-10-CM | POA: Diagnosis not present

## 2016-03-02 DIAGNOSIS — Z789 Other specified health status: Secondary | ICD-10-CM

## 2016-03-02 DIAGNOSIS — H9193 Unspecified hearing loss, bilateral: Secondary | ICD-10-CM

## 2016-03-02 LAB — CBC WITH DIFFERENTIAL/PLATELET
BASOS ABS: 0.1 10*3/uL (ref 0–0.1)
BASOS PCT: 1 %
EOS PCT: 1 %
Eosinophils Absolute: 0.1 10*3/uL (ref 0–0.7)
HEMATOCRIT: 42.4 % (ref 35.0–47.0)
Hemoglobin: 14.2 g/dL (ref 12.0–16.0)
LYMPHS PCT: 26 %
Lymphs Abs: 2.5 10*3/uL (ref 1.0–3.6)
MCH: 27.7 pg (ref 26.0–34.0)
MCHC: 33.5 g/dL (ref 32.0–36.0)
MCV: 82.6 fL (ref 80.0–100.0)
MONO ABS: 0.8 10*3/uL (ref 0.2–0.9)
Monocytes Relative: 8 %
NEUTROS ABS: 6.5 10*3/uL (ref 1.4–6.5)
Neutrophils Relative %: 64 %
PLATELETS: 351 10*3/uL (ref 150–440)
RBC: 5.13 MIL/uL (ref 3.80–5.20)
RDW: 14.2 % (ref 11.5–14.5)
WBC: 10 10*3/uL (ref 3.6–11.0)

## 2016-03-02 LAB — BASIC METABOLIC PANEL
ANION GAP: 5 (ref 5–15)
BUN: 9 mg/dL (ref 6–20)
CALCIUM: 8.7 mg/dL — AB (ref 8.9–10.3)
CO2: 26 mmol/L (ref 22–32)
Chloride: 100 mmol/L — ABNORMAL LOW (ref 101–111)
Creatinine, Ser: 0.73 mg/dL (ref 0.44–1.00)
GFR calc Af Amer: >60 mL/min (ref >60)
GLUCOSE: 88 mg/dL (ref 65–99)
POTASSIUM: 3.9 mmol/L (ref 3.5–5.1)
SODIUM: 131 mmol/L — AB (ref 135–145)

## 2016-03-02 LAB — MAGNESIUM: Magnesium: 2 mg/dL (ref 1.7–2.4)

## 2016-03-02 LAB — TSH: TSH: 1.649 u[IU]/mL (ref 0.350–4.500)

## 2016-03-02 NOTE — Patient Instructions (Addendum)
Medication Instructions:  Your physician recommends that you continue on your current medications as directed. Please refer to the Current Medication list given to you today.   Labwork: Labs today BMP, CBC, Mag, TSH  Testing/Procedures: Your physician has requested that you have an echocardiogram. Echocardiography is a painless test that uses sound waves to create images of your heart. It provides your doctor with information about the size and shape of your heart and how well your heart's chambers and valves are working. This procedure takes approximately one hour. There are no restrictions for this procedure.  Date & Time: ___________________________________________________________________________  Your physician has recommended that you wear an event monitor. Event monitors are medical devices that record the heart's electrical activity. Doctors most often us these monitors to diagnose arrhythmias. Arrhythmias are problems with the speed or rhythm of the heartbeat. The monitor is a small, portable device. You can wear one while you do your normal daily activities. This is usually used to diagnose what is causing palpitations/syncope (passing out).   Will be sent to you in the mail   Follow-Up: Your physician recommends that you schedule a follow-up appointment after testing to review results.   Date & Time: _____________________________________________________________________________   Any Other Special Instructions Will Be Listed Below (If Applicable).     If you need a refill on your cardiac medications before your next appointment, please call your pharmacy.  Echocardiogram An echocardiogram, or echocardiography, uses sound waves (ultrasound) to produce an image of your heart. The echocardiogram is simple, painless, obtained within a short period of time, and offers valuable information to your health care provider. The images from an echocardiogram can provide information such  as:  Evidence of coronary artery disease (CAD).  Heart size.  Heart muscle function.  Heart valve function.  Aneurysm detection.  Evidence of a past heart attack.  Fluid buildup around the heart.  Heart muscle thickening.  Assess heart valve function. LET [facility] PROVIDER KNOW ABOUT:  Any allergies you have.  All medicines you are taking, including vitamins, herbs, eye drops, creams, and over-the-counter medicines.  Previous problems you or members of your family have had with the use of anesthetics.  Any blood disorders you have.  Previous surgeries you have had.  Medical conditions you have.  Possibility of pregnancy, if this applies. BEFORE THE PROCEDURE  No special preparation is needed. Eat and drink normally.  PROCEDURE   In order to produce an image of your heart, gel will be applied to your chest and a wand-like tool (transducer) will be moved over your chest. The gel will help transmit the sound waves from the transducer. The sound waves will harmlessly bounce off your heart to allow the heart images to be captured in real-time motion. These images will then be recorded.  You may need an IV to receive a medicine that improves the quality of the pictures. AFTER THE PROCEDURE You may return to your normal schedule including diet, activities, and medicines, unless your health care provider tells you otherwise.   This information is not intended to replace advice given to you by your health care provider. Make sure you discuss any questions you have with your health care provider.   Document Released: 11/11/2000 Document Revised: 12/05/2014 Document Reviewed: 07/22/2013 Elsevier Interactive Patient Education 2016 [employer]    Cardiac Event Monitoring A cardiac event monitor is a small recording device used to help detect abnormal heart rhythms (arrhythmias). The monitor is used to record heart  rhythm when noticeable symptoms such as the following  occur:  Fast heartbeats (palpitations), such as heart racing or fluttering.  Dizziness.  Fainting or light-headedness.  Unexplained weakness. The monitor is wired to two electrodes placed on your chest. Electrodes are flat, sticky disks that attach to your skin. The monitor can be worn for up to 30 days. You will wear the monitor at all times, except when bathing.  HOW TO USE YOUR CARDIAC EVENT MONITOR A technician will prepare your chest for the electrode placement. The technician will show you how to place the electrodes, how to work the monitor, and how to replace the batteries. Take time to practice using the monitor before you leave the office. Make sure you understand how to send the information from the monitor to your health care provider. This requires a telephone with a landline, not a cell phone. You need to:  Wear your monitor at all times, except when you are in water:  Do not get the monitor wet.  Take the monitor off when bathing. Do not swim or use a hot tub with it on.  Keep your skin clean. Do not put body lotion or moisturizer on your chest.  Change the electrodes daily or any time they stop sticking to your skin. You might need to use tape to keep them on.  It is possible that your skin under the electrodes could become irritated. To keep this from happening, try to put the electrodes in slightly different places on your chest. However, they must remain in the area under your left breast and in the upper right section of your chest.  Make sure the monitor is safely clipped to your clothing or in a location close to your body that your health care provider recommends.  Press the button to record when you feel symptoms of heart trouble, such as dizziness, weakness, light-headedness, palpitations, thumping, shortness of breath, unexplained weakness, or a fluttering or racing heart. The monitor is always on and records what happened slightly before you pressed the button,  so do not worry about being too late to get good information.  Keep a diary of your activities, such as walking, doing chores, and taking medicine. It is especially important to note what you were doing when you pushed the button to record your symptoms. This will help your health care provider determine what might be contributing to your symptoms. The information stored in your monitor will be reviewed by your health care provider alongside your diary entries.  Send the recorded information as recommended by your health care provider. It is important to understand that it will take some time for your health care provider to process the results.  Change the batteries as recommended by your health care provider. SEEK IMMEDIATE MEDICAL CARE IF:   You have chest pain.  You have extreme difficulty breathing or shortness of breath.  You develop a very fast heartbeat that persists.  You develop dizziness that does not go away.  You faint or constantly feel you are about to faint.   This information is not intended to replace advice given to you by your health care provider. Make sure you discuss any questions you have with your health care provider.   Document Released: 08/23/2008 Document Revised: 12/05/2014 Document Reviewed: 05/13/2013 Elsevier Interactive Patient Education [employer]

## 2016-03-02 NOTE — Progress Notes (Signed)
Cardiology Office Note Date:  03/02/2016  Patient ID:  Yesenia, Wood 1986/11/12, MRN [id number] PCP:  Ruel Favors, MD  Cardiologist:  Dr. Kirke Corin, MD    Chief Complaint: SOB and palpitations   History of Present Illness: Yesenia Wood is a 30 y.o. female with history of who is accompanied by her mother and an interpreter presents for follow up of atypical chest pain. 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 She was initially, and last seen by cardiology for evaluation of her chest pain 05/2015 which was associated with palpitations and tachycardia. Prior echo in Tw[place] 2015 was completely normal. She underwent treadmill stress testing 06/23/2015 that was normal. However, her HR and BP were elevated at peak exercise, thus she was started on extended release Cardizem. She also wore a 48-hour Holter monitor given her palpitations that showed sinus tachycardia, she was advised to take Cardizem as above.   In the fall of 2016 into the winter months of 2017 she was dealing with URI symptoms. Having to use her albuterol inhaler more frequently. She was treated with prednisone taper. She noted some mild chest tenderness with deep inspiration. Diagnosed with costochondritis and deconditioning as there was evidence of decreased inpiratory effort on exam with pulmonology in late January 2017. She was advised to use incentive spirometer. CXR 2/20217 was negative for active cardiopulmonary disease.   Patient contacted our office on 02/19/2016 stating for the past 3-4 weeks she has been experiencing an increase in her palpitations which occur at anytime of day or night. However, when she is laying down to go to sleep at nighttime many times the palpitations are severe enough to keep her awake. Thus, she begins many of her nights sleeping in a recliner for an hour or two then  transitions to her bed. The palpitations last approximately 10-15 minutes at a minimum and are now occuring multiple times daily. She actually been able to not have to use her albuterol inhaler quite as often beginning a couple of weeks prior to the onset of the palpitations. During this time her BP has ranged from the 130's to 140's systolic and 80's to low 100's diastolic. She continues to take Cardizem CD 180 mg daily without missing any doses. As far as her asthma goes she continues to feel like she is breathing through a straw. She has a follow up appointment with Dr. Dema Severin, MD on 03/21/2016.   History taken with the assistance of translator for sign language communication.    Past Medical History  Diagnosis Date  . Hyperglycemia   . Obesity   . GERD (gastroesophageal reflux disease)   . Fibromyalgia   . Paresthesia   . Fatigue   . B12 deficiency   . Moderate asthma   . Bell's palsy     right sided  . Headache   . Deaf   . Premature birth   . Fatty liver   . High triglycerides     Past Surgical History  Procedure Laterality Date  . Bartholin gland cyst removal    . Laparoscopy    . Myringotomy with tube placement    . Cochlear implant    . Cochlear implant removal      Current Outpatient Prescriptions  Medication Sig Dispense Refill  . albuterol (PROVENTIL HFA;VENTOLIN HFA) 108 (90 BASE) MCG/ACT inhaler Inhale 2 puffs into the lungs every 4 (four)  hours as needed for wheezing or shortness of breath. 6.7 g 5  . Cyanocobalamin (RA VITAMIN B-12 TR) 1000 MCG TBCR Take by mouth.    . Multiple Vitamin (MULTI-VITAMINS) TABS Take 1 tablet by mouth daily as needed.    . triamcinolone cream (KENALOG) 0.1 % Apply 1 application topically 2 (two) times daily. 80 g 0  . CRANBERRY EXTRACT PO Take by mouth.    . cyclobenzaprine (FLEXERIL) 10 MG tablet Take 1 tablet (10 mg total) by mouth daily as needed for muscle spasms. 180 tablet 1  . diltiazem (CARDIZEM CD) 180 MG 24 hr capsule Take  1 capsule (180 mg total) by mouth daily. 90 capsule 3  . Fluticasone-Salmeterol (ADVAIR DISKUS) 500-50 MCG/DOSE AEPB Inhale 1 puff into the lungs 2 (two) times daily. 60 each 5  . levonorgestrel (MIRENA) 20 MCG/24HR IUD 1 Intra Uterine Device (1 each total) by Intrauterine route once. 1 each 0  . ranitidine (ZANTAC) 150 MG capsule TAKE 1 CAPSULE (150 MG TOTAL) BY MOUTH 2 (TWO) TIMES DAILY. 60 capsule 0  . rizatriptan (MAXALT) 10 MG tablet Take 1 tablet (10 mg total) by mouth as needed for migraine. May repeat in 2 hours if needed 10 tablet 0  . topiramate (TOPAMAX) 50 MG tablet Take 0.5-2 tablets (25-100 mg total) by mouth 2 (two) times daily. 60 tablet 0  . traMADol (ULTRAM) 50 MG tablet TAKE ONE TABLET BY MOUTH EVERY 8 HOURS AS NEEDED 90 tablet 1   No current facility-administered medications for this visit.    Allergies:   Symbicort; Gabapentin; Nortriptyline; Diclofenac; Duloxetine hcl; Montelukast sodium; Mupirocin; Naproxen; and Sulfa antibiotics   Social History:  The patient  reports that she quit smoking about 9 years ago. Her smoking use included Cigarettes. She has a 2 pack-year smoking history. She has never used smokeless tobacco. She reports that she does not drink alcohol or use illicit drugs.   Family History:  The patient's family history is not on file. She was adopted.  ROS:   Review of Systems  Constitutional: Positive for malaise/fatigue. Negative for fever, chills, weight loss and diaphoresis.  HENT: Negative for congestion.   Eyes: Negative for discharge and redness.  Respiratory: Positive for shortness of breath. Negative for cough, hemoptysis, sputum production and wheezing.   Cardiovascular: Positive for palpitations and leg swelling. Negative for chest pain, orthopnea, claudication and PND.       No orthopnea, though starts out sleeping in a recliner many nights 2/2 palpitations   Gastrointestinal: Negative for heartburn, nausea, vomiting, abdominal pain, diarrhea,  constipation, blood in stool and melena.  Musculoskeletal: Negative for myalgias and falls.  Skin: Negative for rash.  Neurological: Positive for weakness. Negative for dizziness, tingling, tremors, sensory change, focal weakness, seizures, loss of consciousness and headaches.  Endo/Heme/Allergies: Does not bruise/bleed easily.  Psychiatric/Behavioral: Negative for depression, suicidal ideas, hallucinations, memory loss and substance abuse. The patient is nervous/anxious and has insomnia.   All other systems reviewed and are negative.    PHYSICAL EXAM:  VS:  BP 140/78 mmHg  Pulse 97  Ht  (1.6 m)  Wt 173 lb 12 oz (78.812 kg)  BMI 30.79 kg/m2 BMI: Body mass index is 30.79 kg/(m^2). Well nourished, well developed, in no acute distress HEENT: normocephalic, atraumatic, deaf-->objective 3rd party translator used during office visit for sign language  Neck: no JVD, carotid bruits or masses Cardiac:  normal S1, S2; RRR; no murmurs, rubs, or gallops Lungs:  clear to auscultation bilaterally, no  wheezing, rhonchi or rales Abd: soft, nontender, no hepatomegaly, + BS MS: no deformity or atrophy Ext: no edema (no edema noted on exam) Skin: warm and dry, no rash Neuro:  moves all extremities spontaneously, no focal abnormalities noted, follows commands Psych: euthymic mood, full affect   EKG:  Was ordered today. Shows NSR, 97 bpm, low voltage QRS, no acute st/t changes   Recent Labs: 06/23/2015: TSH 3.740 10/29/2015: ALT 59*; BUN 7; Creatinine, Ser 0.81; Potassium 4.3; Sodium 139 03/02/2016: Hemoglobin 14.2; Platelets 351  10/29/2015: Chol/HDL Ratio 4.5*; Cholesterol, Total 192; HDL 43; LDL Calculated 123*; Triglycerides 128   CrCl cannot be calculated (Patient has no serum creatinine result on file.).   Wt Readings from Last 3 Encounters:  03/02/16 173 lb 12 oz (78.812 kg)  01/28/16 175 lb (79.379 kg)  12/28/15 179 lb 12.8 oz (81.557 kg)     Other studies reviewed: Additional  studies/records reviewed today include: summarized above  ASSESSMENT AND PLAN:  1. Palpitations: Increased palpitation burden over the past 3-4 weeks occuring multiple times daily with episodes lasting 10-15 minutes at at time. Prior 48-hour Holter monitor showed sinus tachycardia, otherwise no significant arrhythmia. Given her increased burden and symptoms schedule 14 event monitor to evaluate for significant arrhythmia, including possible SVT. Continue Cardizem CD 180 mg daily. Check bmet, magnesium, TSH, and CBC. Unlikely to be from albuterol usage as this has actually decreased as of late, even decreased preceding her increased palpitation burden. However, could consider Xopenex if ok with pulmonology.   2. SOB: As above. Will also update echocardiogram given increase palpitation burden, need to fall asleep in a recliner (no true orthopnea), and some early satiety. Possibly multifactorial including asthma, deconditioning, anxiety, fibromyalgia, and possible component of #1 pending results.   3. Elevated blood pressure: No true diagnosis of HTN. Continue Cardizem CD 180 mg daily. If readings persistently remain elevated may need to increase Cardizem.    4. Fibromyalgia: Has been having increased number of flare lately, possibly leading to the above. Patient self admits this as well. Per PCP to work on increased control and further assess improvement in the above.   5. Asthma: Per pulmonology. Avoid BB.   6. Language barrier: Communicated with the patient through the assistance with 3rd party translator for sign language.     Disposition: F/u with Dr. Kirke CorinArida, MD or myself in 5-6 weeks  Current medicines are reviewed at length with the patient today.  The patient did not have any concerns regarding medicines.  Elinor DodgeSigned, Kimberlye Dilger PA-C 03/02/2016 4:26 PM     [facility] [place] [address] [place], [place] [address] [phone]

## 2016-03-04 DIAGNOSIS — J301 Allergic rhinitis due to pollen: Secondary | ICD-10-CM | POA: Diagnosis not present

## 2016-03-07 DIAGNOSIS — J301 Allergic rhinitis due to pollen: Secondary | ICD-10-CM | POA: Diagnosis not present

## 2016-03-10 ENCOUNTER — Telehealth: Payer: Self-pay | Admitting: Cardiovascular Disease

## 2016-03-10 NOTE — Telephone Encounter (Signed)
Received notification from [employer]. Unable to reach patient to confirm shipping address for 30 day event monitor. Sent pt MyChart message requesting she have interpreter contact compnay. Re-entered order into [employer].

## 2016-03-11 ENCOUNTER — Ambulatory Visit (INDEPENDENT_AMBULATORY_CARE_PROVIDER_SITE_OTHER): Payer: Medicare Other

## 2016-03-11 DIAGNOSIS — R0602 Shortness of breath: Secondary | ICD-10-CM | POA: Diagnosis not present

## 2016-03-11 DIAGNOSIS — R002 Palpitations: Secondary | ICD-10-CM | POA: Diagnosis not present

## 2016-03-15 ENCOUNTER — Encounter: Payer: Self-pay | Admitting: Family Medicine

## 2016-03-16 ENCOUNTER — Ambulatory Visit (INDEPENDENT_AMBULATORY_CARE_PROVIDER_SITE_OTHER): Payer: Medicare Other | Admitting: Family Medicine

## 2016-03-16 ENCOUNTER — Encounter: Payer: Self-pay | Admitting: Family Medicine

## 2016-03-16 VITALS — BP 118/70 | HR 100 | Temp 98.3°F | Resp 16 | Ht 63.0 in | Wt 176.3 lb

## 2016-03-16 DIAGNOSIS — S6992XA Unspecified injury of left wrist, hand and finger(s), initial encounter: Secondary | ICD-10-CM | POA: Diagnosis not present

## 2016-03-16 DIAGNOSIS — R002 Palpitations: Secondary | ICD-10-CM

## 2016-03-16 DIAGNOSIS — Z23 Encounter for immunization: Secondary | ICD-10-CM

## 2016-03-16 NOTE — Progress Notes (Signed)
Name: Yesenia Wood   MRN: [id number]    DOB: 1986/06/19   Date:03/16/2016       Progress Note  Subjective  Chief Complaint  Chief Complaint  Patient presents with  . Finger Injury    HPI  Left finger injury: she was outdoors and picked up a log , a splinter got on her left index finger, 4 days ago, she removed the splinter but it was getting more red yesterday. It never drained any pus. It is mildly tender.  Palpitation: she is seeing Cardiologist, wearing event holter at this time, she still has some palpitation. No chest pain   Patient Active Problem List   Diagnosis Date Noted  . Costochondral chest pain 12/28/2015  . Leukocytosis 10/29/2015  . Pain in the chest 06/09/2015  . Palpitations 06/09/2015  . Hearing loss 05/16/2015  . B12 deficiency 05/16/2015  . Cochlear implant status 05/16/2015  . Fatty infiltration of liver 05/16/2015  . Fibromyalgia syndrome 05/16/2015  . Gastro-esophageal reflux disease without esophagitis 05/16/2015  . Generalized headache 05/16/2015  . Right-sided Bell's palsy 05/16/2015  . H/O: depression 05/16/2015  . Hypertriglyceridemia 05/16/2015  . Blood glucose elevated 05/16/2015  . Irregular menstrual cycle 05/16/2015  . Overweight 05/16/2015  . Paresthesia 05/16/2015  . Disorder of labyrinth 05/16/2015  . Dyspnea 10/07/2014  . Moderate persistent intrinsic asthma without status asthmaticus without complication 09/98/3382    Past Surgical History  Procedure Laterality Date  . Bartholin gland cyst removal    . Laparoscopy    . Myringotomy with tube placement    . Cochlear implant    . Cochlear implant removal      Family History  Problem Relation Age of Onset  . Adopted: Yes    Social History   Social History  . Marital Status: Single    Spouse Name: N/A  . Number of Children: N/A  . Years of Education: N/A   Occupational History  . Not on file.   Social History Main Topics  . Smoking status: Former Smoker -- 1.00  packs/day for 2 years    Types: Cigarettes    Quit date: 11/28/2006  . Smokeless tobacco: Never Used  . Alcohol Use: No     Comment: rarely  . Drug Use: No  . Sexual Activity: Yes    Birth Control/ Protection: Pill   Other Topics Concern  . Not on file   Social History Narrative     Current outpatient prescriptions:  .  albuterol (PROVENTIL HFA;VENTOLIN HFA) 108 (90 BASE) MCG/ACT inhaler, Inhale 2 puffs into the lungs every 4 (four) hours as needed for wheezing or shortness of breath., Disp: 6.7 g, Rfl: 5 .  CRANBERRY EXTRACT PO, Take by mouth., Disp: , Rfl:  .  Cyanocobalamin (RA VITAMIN B-12 TR) 1000 MCG TBCR, Take by mouth., Disp: , Rfl:  .  cyclobenzaprine (FLEXERIL) 10 MG tablet, Take 1 tablet (10 mg total) by mouth daily as needed for muscle spasms., Disp: 180 tablet, Rfl: 1 .  diltiazem (CARDIZEM CD) 180 MG 24 hr capsule, Take 1 capsule (180 mg total) by mouth daily., Disp: 90 capsule, Rfl: 3 .  Fluticasone-Salmeterol (ADVAIR DISKUS) 500-50 MCG/DOSE AEPB, Inhale 1 puff into the lungs 2 (two) times daily., Disp: 60 each, Rfl: 5 .  levonorgestrel (MIRENA) 20 MCG/24HR IUD, 1 Intra Uterine Device (1 each total) by Intrauterine route once., Disp: 1 each, Rfl: 0 .  Multiple Vitamin (MULTI-VITAMINS) TABS, Take 1 tablet by mouth daily as needed., Disp: ,  Rfl:  .  ranitidine (ZANTAC) 150 MG capsule, TAKE 1 CAPSULE (150 MG TOTAL) BY MOUTH 2 (TWO) TIMES DAILY., Disp: 60 capsule, Rfl: 0 .  rizatriptan (MAXALT) 10 MG tablet, Take 1 tablet (10 mg total) by mouth as needed for migraine. May repeat in 2 hours if needed, Disp: 10 tablet, Rfl: 0 .  topiramate (TOPAMAX) 50 MG tablet, Take 0.5-2 tablets (25-100 mg total) by mouth 2 (two) times daily., Disp: 60 tablet, Rfl: 0 .  traMADol (ULTRAM) 50 MG tablet, TAKE ONE TABLET BY MOUTH EVERY 8 HOURS AS NEEDED, Disp: 90 tablet, Rfl: 1 .  triamcinolone cream (KENALOG) 0.1 %, Apply 1 application topically 2 (two) times daily., Disp: 80 g, Rfl:  0  Allergies  Allergen Reactions  . Symbicort [Budesonide-Formoterol Fumarate] Anaphylaxis and Other (See Comments)    Chest pain  . Gabapentin Nausea And Vomiting  . Nortriptyline     Other reaction(s): Other (See Comments)  . Diclofenac Other (See Comments)  . Duloxetine Hcl Other (See Comments)  . Montelukast Sodium Other (See Comments)  . Mupirocin Other (See Comments)  . Naproxen Other (See Comments)  . Sulfa Antibiotics Hives     ROS  Ten systems reviewed and is negative except as mentioned in HPI   Objective  Filed Vitals:   03/16/16 1219  BP: 118/70  Pulse: 100  Temp: 98.3 F (36.8 C)  TempSrc: Oral  Resp: 16  Height: '5\' 3"'  (1.6 m)  Weight: 176 lb 4.8 oz (79.969 kg)  SpO2: 97%    Body mass index is 31.24 kg/(m^2).  Physical Exam   Constitutional: Patient appears well-developed and well-nourished. Obese  No distress.  HEENT: head atraumatic, normocephalic, pupils equal and reactive to light, neck supple, throat within normal limits Cardiovascular: Normal rate, regular rhythm and normal heart sounds.  No murmur heard. No BLE edema. Pulmonary/Chest: Effort normal and breath sounds normal. No respiratory distress. Abdominal: Soft.  There is no tenderness. Psychiatric: Patient has a normal mood and affect. behavior is normal. Judgment and thought content normal. Skin: exam of left index finger shows some bruising on the medial aspect and area of port of entry seems to be well healed.   Recent Results (from the past 2160 hour(s))  IgE     Status: None   Collection Time: 12/24/15  2:35 PM  Result Value Ref Range   IgE (Immunoglobulin E), Serum 17 0 - 100 IU/mL    Comment: (NOTE) Performed At: [facility] [place], [place] [id number] Lindon Romp MD NW:[id number]   Miscellaneous [facility] test (send-out)     Status: None   Collection Time: 12/24/15  2:35 PM  Result Value Ref Range   [facility] test code [id number]    [facility] test name  EOSINOPHIL COUNT    Misc [facility] result COMMENT     Comment: (NOTE) Test Ordered: [id number] Eosinophil Count Eos (Absolute)                 0.1              x10E3/uL BN     Reference Range: 0.0-0.4                               Performed At: [facility] [place], [place] [id number] Lindon Romp MD KG:[id number]   POCT Urinalysis Dipstick     Status: None   Collection Time: 01/28/16  3:33  PM  Result Value Ref Range   Color, UA light yellow    Clarity, UA clear    Glucose, UA neg    Bilirubin, UA neg    Ketones, UA neg    Spec Grav, UA 1.015    Blood, UA neg    pH, UA 6.0    Protein, UA trace    Urobilinogen, UA 0.2    Nitrite, UA neg    Leukocytes, UA Negative Negative  Urine culture     Status: None   Collection Time: 01/29/16 12:00 AM  Result Value Ref Range   Urine Culture, Routine Final report    Urine Culture result 1 Comment     Comment: Mixed urogenital flora 10,000-25,000 colony forming units per mL   Basic metabolic panel     Status: Abnormal   Collection Time: 03/02/16  3:34 PM  Result Value Ref Range   Sodium 131 (L) 135 - 145 mmol/L   Potassium 3.9 3.5 - 5.1 mmol/L   Chloride 100 (L) 101 - 111 mmol/L   CO2 26 22 - 32 mmol/L   Glucose, Bld 88 65 - 99 mg/dL   BUN 9 6 - 20 mg/dL   Creatinine, Ser 0.73 0.44 - 1.00 mg/dL   Calcium 8.7 (L) 8.9 - 10.3 mg/dL   GFR calc non Af Amer >60 >60 mL/min   GFR calc Af Amer >60 >60 mL/min    Comment: (NOTE) The eGFR has been calculated using the CKD EPI equation. This calculation has not been validated in all clinical situations. eGFR's persistently <60 mL/min signify possible Chronic Kidney Disease.    Anion gap 5 5 - 15  CBC with Differential/Platelet     Status: None   Collection Time: 03/02/16  3:34 PM  Result Value Ref Range   WBC 10.0 3.6 - 11.0 K/uL   RBC 5.13 3.80 - 5.20 MIL/uL   Hemoglobin 14.2 12.0 - 16.0 g/dL   HCT 42.4 35.0 - 47.0 %   MCV 82.6 80.0 - 100.0 fL   MCH 27.7 26.0 -  34.0 pg   MCHC 33.5 32.0 - 36.0 g/dL   RDW 14.2 11.5 - 14.5 %   Platelets 351 150 - 440 K/uL   Neutrophils Relative % 64 %   Neutro Abs 6.5 1.4 - 6.5 K/uL   Lymphocytes Relative 26 %   Lymphs Abs 2.5 1.0 - 3.6 K/uL   Monocytes Relative 8 %   Monocytes Absolute 0.8 0.2 - 0.9 K/uL   Eosinophils Relative 1 %   Eosinophils Absolute 0.1 0 - 0.7 K/uL   Basophils Relative 1 %   Basophils Absolute 0.1 0 - 0.1 K/uL  Magnesium     Status: None   Collection Time: 03/02/16  3:34 PM  Result Value Ref Range   Magnesium 2.0 1.7 - 2.4 mg/dL  TSH     Status: None   Collection Time: 03/02/16  3:34 PM  Result Value Ref Range   TSH 1.649 0.350 - 4.500 uIU/mL      PHQ2/9: Depression screen [facility] 2/9 03/16/2016 01/28/2016 10/01/2015 07/20/2015 05/18/2015  Decreased Interest 0 0 0 0 0  Down, Depressed, Hopeless 0 0 0 1 0  PHQ - 2 Score 0 0 0 1 0     Fall Risk: Fall Risk  03/16/2016 01/28/2016 10/01/2015 07/20/2015 05/18/2015  Falls in the past year? Yes Yes Yes Yes Yes  Number falls in past yr: '1 1 2 ' or more 1 2 or more  Injury with  Fall? Yes No No No No  Risk Factor Category  - - - - High Fall Risk     Functional Status Survey: Is the patient deaf or have difficulty hearing?: Yes Does the patient have difficulty seeing, even when wearing glasses/contacts?: No Does the patient have difficulty concentrating, remembering, or making decisions?: No Does the patient have difficulty walking or climbing stairs?: No Does the patient have difficulty dressing or bathing?: No Does the patient have difficulty doing errands alone such as visiting a doctor's office or shopping?: No   Assessment & Plan   1. Finger injury, left, initial encounter  It seems to be healing well, saw pictures that she took daily since accident and currently mostly shows a bruise, advised to monitor, soak is warm water and call back if any purulent drainage or worsening of pain or increase in redness - Tdap vaccine greater than or  equal to 7yo IM  2. Palpitation  Seen by cardiologist, currently has an event holter monitor, still having palpitations   3. Need for Tdap vaccination  - Tdap vaccine greater than or equal to 7yo IM

## 2016-03-17 ENCOUNTER — Other Ambulatory Visit: Payer: Self-pay

## 2016-03-17 ENCOUNTER — Ambulatory Visit (INDEPENDENT_AMBULATORY_CARE_PROVIDER_SITE_OTHER): Payer: Medicare Other

## 2016-03-17 DIAGNOSIS — R002 Palpitations: Secondary | ICD-10-CM

## 2016-03-17 DIAGNOSIS — R0602 Shortness of breath: Secondary | ICD-10-CM

## 2016-03-21 ENCOUNTER — Ambulatory Visit (INDEPENDENT_AMBULATORY_CARE_PROVIDER_SITE_OTHER): Payer: Medicare Other | Admitting: Internal Medicine

## 2016-03-21 ENCOUNTER — Encounter: Payer: Self-pay | Admitting: Internal Medicine

## 2016-03-21 VITALS — BP 122/68 | HR 79 | Ht 63.0 in | Wt 173.0 lb

## 2016-03-21 DIAGNOSIS — J4540 Moderate persistent asthma, uncomplicated: Secondary | ICD-10-CM

## 2016-03-21 DIAGNOSIS — J301 Allergic rhinitis due to pollen: Secondary | ICD-10-CM | POA: Diagnosis not present

## 2016-03-21 NOTE — Progress Notes (Signed)
[facility] Pulmonary Medicine Consultation      MRN# [id number] Yesenia Wood [birth year]   CC: Chief Complaint  Patient presents with  . Follow-up    pt states breathing is baseline. c/o occ SOB, non prod cough, cp/tightness.       Brief History: 10/07/2014 History: She is a pleasant 30 year old female accompanied by her mother and interpreter, seen in consultation for worsening asthma and shortness of breath, referred by Dr. Carlynn Purl.  History per the patient and review of medical records Patient is a past medical history of deafness, status post cochlear implant with failure, fibromyalgia, B12 deficiency, moderate asthma, obesity. Her asthma history is as follows: As a teenager exercise-induced bronchospasms that was treated with as needed albuterol, and her early 79s started having more shortness of breath and was previously on Qvar. Triggers: Unsure, mild exacerbation with seasonal changes, more shortness of breath with warm weather (this is very mild). Patient cannot identify any inciting factor 2-3 months ago that made her breathing more difficult. She had a spirometry done at her previous pulmonologist in [place], 07/11/2013, FEV1 79, FEV1/FVC 74. Her repeat spirometry at [facility] showed FEV1 48%, FEV1/FVC 66% on 09/02/2014. There has been no significantrespiratory illness in the past year the patient or her mother can identify a significant drop in her FEV1 and worsening of her symptoms. 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 She previously smoked one to 2 packs per day for 2 years and quit 8 years ago. She denies any E. Cigarettes or water-based tobacco products. Patient has a dog, Yorkie, that stay her,she states that she is very clean, does not have a lot of carpeting, no roaches or rodent infestations in her home. 2 months ago she was diagnosed with a urinary tract infection,  shortly after that chest tightness and difficulty breathing, went to the ER, had a negative d-dimer, will was told she had chest wall discomfort and was given a prednisone taper for shortness of breath. Upon completion of her prednisone taper she develop right-sided facial Bell's palsy. She has also had testing for Lyme, which was negative. On 09/16/2014 her primary care physician changed her controller medication to Advair 250/50, from Qvar, she is also on Ventolin, which she is currently using 3-4 times per day for chest tightness and shortness of breath: She states this does not provide significant relief. She denies fever, chills. 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 She could not recall any tick bites. Patient states that she has a peak flow meter at home and uses it every morning, currently she is averaging about 300 for the past one month, which is the yellow zone for her. Patient denies any cough, runny nose, mucus production.  PLAN: - CT Chest, IgE and IgG level, alpha1-antitrypsin, cardiac ECHO (bubble studies)   ROV 11/12/14 Patient presents today for a follow up visit of asthma (uncontrolled) Advair increased at last visit, no significant improvement.  At this point still with sob, and chest tightness, using albuterol 2-3 times a day for chest tightness and wheezing and "vibration" in chest.  Currently seeing ENT (Dr. 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 She would also like to discuss the results of her labs and imaging studies. Mother present at today's visit.  Plan -  mild asthma exacerbation - Prednisone taper over 1 month, advair, prn albuterol   ROV 03/04/15 Since her last visit she has had a 1 month trial of prednisone, which greatly improved her symptoms (chest tightness and sob). She stated that she complete steroid last week, and this  week the symptoms are back, she is currently using albuterol 2-3 times per day; previously on steroid maybe 1-2 times per day. Her family is planning to remove the rugs\carpet and get hypoallergenic pillows and sheets, due to dust mite allergy.  She has seen ENT (Dr. Andee Poles) and was told that her vocal cords were fine and was told that she had dust mite allergy. She had RAST testing done.  She has not had allergy testing since childhood.  Plan - allergy testing [facility] [place] (Dr. Maple Hudson), prednisone  daily, follow up with ENT  ROV 05/14/15 Yesenia Wood presents today for a follow up visit of her asthma, which has been difficult to control. Her asthma she is currently on Advair 500/50, prednisone 5 mg daily, and as needed albuterol. She states that she's currently using albuterol 3-4 times a day for the past 2-3 weeks. At this time her triggers are heat and rapid weather changes. Since her last visit she has seen ENT, Dr. Andee Poles who stated that she had severe allergies to pollen, dust mites, grass/ragweed. She is currently receiving allergy shots 2 times per week. Also she has stopped pulmonary rehabilitation due to not providing any benefit. She was complaining of right sided stomach pain and flank pain,along with constipation to her primary care physician, which prompted an ultrasound of her abdomen; ultrasound yielded fatty liver. Today she is complaining of chest tightness, and some shortness of breath, but this is tolerable for her. Over the last 2-3 months she states that she's also complaining of chest pain/chest discomfort, she states that her chest feels tight and she becomes diaphoretic and very short winded. When this occurs she uses her albuterol, but it does not help much. This chest discomfort has increased over the last 3 months.  Per her visit with Dr. Maple Hudson April 2016: Allergy skin testing 03/04/15- Strong Positive esp for trees and dust mites, some molds.  Plan - cont with  asthma regiment, cardiology evaluation.   ROV 05/2015 Patient presents for follow up visit of her asthma. Still with cough and shortness of breath at times. Currently getting allergy shots,twice weekly, this is not maintenance dose per the mother. Patient is accompanied by mother and sign language interpreter.  Still on prednisone and allergy shots. Patient had holter monitoring (48 hours) and treadmill stress test performed by Cardiology, results are pending.  Plan: -Continue with current allergy shots and current asthma regimen. -We'll start weaning Advair at next visit - Start prednisone wean as stated above  -Avoid asthma triggers as much as tolerable.  08/2015: Patient is well known to the pulmonary clinic, has moderate to severe uncontrolled asthma, requiring daily albuterol use. She's had a thorough workup for asthma including cardiac and pulmonary evaluation. Since her last visit she has seen cardiology, they have started her on Cardizem for high blood pressure and tachycardia.  She is accompanied today by her mother and hearing interpreter. Since her last visit she has weaned off prednisone 2.5 mg daily. She states that her breathing is slightly improved since being on Cardizem and becoming a little bit more active in terms of exercise. She still using albuterol daily, but is down from 5 times per day to 3 times per  day. She does not endorse any wheezing but does endorse shortness of breath. She also states that she is increase her level of activity, she tries to walk on a daily basis with her dog. She is getting allergy shots currently not at the maintenance level, but is being followed closely by ENT. -Her primary care physician has started her on valacyclovir for Bell's palsy. Plan: -Continue with current allergy shots and current asthma regimen. -We will start weaning Advair once her use of albuterol has decreased -Avoid asthma triggers as much as tolerable. Events  since last clinic visit: Patient presents today for follow-up visit of her asthma. She is accompanied by her mother. Interpreter Marcelino Duster ([place] [id number]) available through video conferencing. Review of records shows that in December 2016 she was placed on a five-day steroid pack for upper respiratory symptoms. Since her last visit, she's had intermittent episodes of palpitations and tachycardia, she is currently wearing a Holter monitor for 2 weeks. She is started on diltiazem. She still endorses shortness of breath, chest tightness, dyspnea on exertion, but not worse than previously. She is also receiving allergy shots from ENT. Overall her status is stable, but not improving to her expectation. She has significantly reduce her use of albuterol to maybe once a week or once every 2 weeks; he states it's not helping much anymore. Review of her cardiology clinic notes show that they are recommending changing from albuterol to Xopenex due to episodes of tachycardia palpitations. Also at her last visit, she was treated for possible costochondritis with ibuprofen therapy schedule, she states that this did not help her chest pain.  Medication:   Current Outpatient Rx  Name  Route  Sig  Dispense  Refill  . albuterol (PROVENTIL HFA;VENTOLIN HFA) 108 (90 BASE) MCG/ACT inhaler   Inhalation   Inhale 2 puffs into the lungs every 4 (four) hours as needed for wheezing or shortness of breath.   6.7 g   5   . CRANBERRY EXTRACT PO   Oral   Take by mouth.         . Cyanocobalamin (RA VITAMIN B-12 TR) 1000 MCG TBCR   Oral   Take by mouth.         . cyclobenzaprine (FLEXERIL) 10 MG tablet   Oral   Take 1 tablet (10 mg total) by mouth daily as needed for muscle spasms.   180 tablet   1   . Fluticasone-Salmeterol (ADVAIR DISKUS) 500-50 MCG/DOSE AEPB   Inhalation   Inhale 1 puff into the lungs 2 (two) times daily.   60 each   5   . levonorgestrel (MIRENA) 20 MCG/24HR IUD   Intrauterine   1 Intra  Uterine Device (1 each total) by Intrauterine route once.   1 each   0   . Multiple Vitamin (MULTI-VITAMINS) TABS   Oral   Take 1 tablet by mouth daily as needed.         . rizatriptan (MAXALT) 10 MG tablet   Oral   Take 1 tablet (10 mg total) by mouth as needed for migraine. May repeat in 2 hours if needed   10 tablet   0   . traMADol (ULTRAM) 50 MG tablet      TAKE ONE TABLET BY MOUTH EVERY 8 HOURS AS NEEDED   90 tablet   1     This request is for a new prescription for a contr ...   . triamcinolone cream (KENALOG) 0.1 %   Topical  Apply 1 application topically 2 (two) times daily.   80 g   0      Review of Systems  Constitutional: Negative for fever, chills, weight loss, malaise/fatigue and diaphoresis.  HENT: Negative for congestion, ear discharge, ear pain, nosebleeds, sore throat and tinnitus.   Eyes: Negative for blurred vision and double vision.  Respiratory: Positive for shortness of breath. Negative for cough, sputum production, wheezing and stridor.        Baseline shortness of breath especially with exertion   Cardiovascular: Negative for chest pain and leg swelling.  Gastrointestinal: Negative for heartburn, nausea and vomiting.  Genitourinary: Negative for dysuria.  Musculoskeletal: Negative for myalgias.  Skin: Negative for itching and rash.  Neurological: Negative for dizziness, weakness and headaches.  Endo/Heme/Allergies: Does not bruise/bleed easily.  Psychiatric/Behavioral: Negative for depression.      Allergies:  Symbicort; Gabapentin; Nortriptyline; Diclofenac; Duloxetine hcl; Montelukast sodium; Mupirocin; Naproxen; and Sulfa antibiotics  Physical Examination:  VS: BP 122/68 mmHg  Pulse 79  Ht 5\' 3"  (1.6 m)  Wt 173 lb (78.472 kg)  BMI 30.65 kg/m2  SpO2 96%  General Appearance: No distress  HEENT: PERRLA, no ptosis, no other lesions noticed Pulmonary:   Shallow breath sounds, poor respiratory effort with deep inspiration, mid  chest tenderness over the sternum with mild to mod palpation Cardiovascular:  Normal S1,S2.  No m/r/g.     Abdomen:Exam: Benign, Soft, non-tender, No masses  Skin:   warm, no rashes, no ecchymosis  Extremities: normal, no cyanosis, clubbing, warm with normal capillary refill.    Assessment and Plan: 30 year old female seen in follow-up for moderate-severe persistent asthma. Moderate persistent intrinsic asthma without status asthmaticus without complication Atopic as a small child. Role of environmental atopic triggers now is unclear. Total IgE not high, and reported in vitro test showed only dust mites. Allergy skin testing 03/04/15- Strong Positive esp for trees and dust mites, some molds.  Currently patient is receiving allergy shots at ENT office. Current regimen is: Advair 500/50, rescue inhaler, allergy shots. IgE= 17, Abs eosinophil = 0.1 >>both normal Using albuterol 1-2 times in 2 weeks.  Avoidance of triggers is paramount to her quality of life, however this is difficult given that her triggers are mostly environmentally related. I have discussed starting Xolair with ENT, they currently see no contraindication but since she is on suboptimal doses of allergy shots Xolair is not recommended at this time.  Following with cardiology for episodes of tachycardia/palpitations, currently wearing a Holter monitor for total of 2 weeks. She is also on diltiazem. At this time cannot switch her from albuterol to Xopenex due to allergy component in Xopenex. In any event, her albuterol usage is significantly decrease and is not clinically adding to tachycardia/palpitations.   Plan: -Continue with allergy regiment, allergy shots, continue following with cardiology Tachycardia/palpitations. -We will start weaning Advair once her use of albuterol has decreased -Avoid asthma triggers as much as tolerable. -Overall with stable asthma.             Updated Medication List Outpatient  Encounter Prescriptions as of 03/21/2016  Medication Sig  . albuterol (PROVENTIL HFA;VENTOLIN HFA) 108 (90 BASE) MCG/ACT inhaler Inhale 2 puffs into the lungs every 4 (four) hours as needed for wheezing or shortness of breath.  [place] Neat EXTRACT PO Take by mouth.  . Cyanocobalamin (RA VITAMIN B-12 TR) 1000 MCG TBCR Take by mouth.  . cyclobenzaprine (FLEXERIL) 10 MG tablet Take 1 tablet (10 mg total) by mouth daily as  needed for muscle spasms.  . Fluticasone-Salmeterol (ADVAIR DISKUS) 500-50 MCG/DOSE AEPB Inhale 1 puff into the lungs 2 (two) times daily.  Marland Kitchen. levonorgestrel (MIRENA) 20 MCG/24HR IUD 1 Intra Uterine Device (1 each total) by Intrauterine route once.  . Multiple Vitamin (MULTI-VITAMINS) TABS Take 1 tablet by mouth daily as needed.  . rizatriptan (MAXALT) 10 MG tablet Take 1 tablet (10 mg total) by mouth as needed for migraine. May repeat in 2 hours if needed  . traMADol (ULTRAM) 50 MG tablet TAKE ONE TABLET BY MOUTH EVERY 8 HOURS AS NEEDED  . triamcinolone cream (KENALOG) 0.1 % Apply 1 application topically 2 (two) times daily.  . [DISCONTINUED] diltiazem (CARDIZEM CD) 180 MG 24 hr capsule Take 1 capsule (180 mg total) by mouth daily. (Patient not taking: Reported on 03/21/2016)  . [DISCONTINUED] ranitidine (ZANTAC) 150 MG capsule TAKE 1 CAPSULE (150 MG TOTAL) BY MOUTH 2 (TWO) TIMES DAILY. (Patient not taking: Reported on 03/21/2016)  . [DISCONTINUED] topiramate (TOPAMAX) 50 MG tablet Take 0.5-2 tablets (25-100 mg total) by mouth 2 (two) times daily. (Patient not taking: Reported on 03/21/2016)   No facility-administered encounter medications on file as of 03/21/2016.    Orders for this visit: No orders of the defined types were placed in this encounter.    Thank  you for the visitation and for allowing  [facility] to assist in the care of your patient. Our recommendations are noted above.  Please contact us if we can be of further service.  Stephanie AcreVishal Patrcia Schnepp,  MD [facility] Office Number: [phone]  Note: This note was prepared with Dragon dictation along with smaller phrase technology. Any transcriptional errors that result from this process are unintentional.

## 2016-03-21 NOTE — Patient Instructions (Addendum)
Follow up with Dr. Dema SeverinMungal in:3 months - cont with incentive spirometry daily - cannot change to Xopenex, due to allergies (has a component similar to Symbicort, which you have a high allergy to).  - cont with allergy shots - cont with current dose of Advair

## 2016-03-23 ENCOUNTER — Other Ambulatory Visit: Payer: Self-pay | Admitting: Family Medicine

## 2016-03-23 NOTE — Telephone Encounter (Signed)
Patient requesting refill. 

## 2016-03-23 NOTE — Assessment & Plan Note (Addendum)
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 Role of environmental atopic triggers now is unclear. Total IgE not high, and reported in vitro test showed only dust mites. Allergy skin testing 03/04/15- Strong Positive esp for trees and dust mites, some molds.  Currently patient is receiving allergy shots at ENT office. Current regimen is: Advair 500/50, rescue inhaler, allergy shots. IgE= 17, Abs eosinophil = 0.1 >>both normal Using albuterol 1-2 times in 2 weeks.  Avoidance of triggers is paramount to her quality of life, however this is difficult given that her triggers are mostly environmentally related. I have discussed starting Xolair with ENT, they currently see no contraindication but since she is on suboptimal doses of allergy shots Xolair is not recommended at this time.  Following with cardiology for episodes of tachycardia/palpitations, currently wearing a Holter monitor for total of 2 weeks. She is also on diltiazem. At this time cannot switch her from albuterol to Xopenex due to allergy component in Xopenex. In any event, her albuterol usage is significantly decrease and is not clinically adding to tachycardia/palpitations.   Plan: -Continue with allergy regiment, allergy shots, continue following with cardiology Tachycardia/palpitations. -We will start weaning Advair once her use of albuterol has decreased -Avoid asthma triggers as much as tolerable. -Overall with stable asthma.

## 2016-03-29 DIAGNOSIS — R200 Anesthesia of skin: Secondary | ICD-10-CM | POA: Diagnosis not present

## 2016-03-29 DIAGNOSIS — R531 Weakness: Secondary | ICD-10-CM | POA: Diagnosis not present

## 2016-03-29 DIAGNOSIS — G43709 Chronic migraine without aura, not intractable, without status migrainosus: Secondary | ICD-10-CM | POA: Diagnosis not present

## 2016-03-29 DIAGNOSIS — M79601 Pain in right arm: Secondary | ICD-10-CM | POA: Diagnosis not present

## 2016-03-29 DIAGNOSIS — M797 Fibromyalgia: Secondary | ICD-10-CM | POA: Diagnosis not present

## 2016-03-29 DIAGNOSIS — R202 Paresthesia of skin: Secondary | ICD-10-CM | POA: Diagnosis not present

## 2016-03-30 DIAGNOSIS — J301 Allergic rhinitis due to pollen: Secondary | ICD-10-CM | POA: Diagnosis not present

## 2016-04-05 ENCOUNTER — Ambulatory Visit (INDEPENDENT_AMBULATORY_CARE_PROVIDER_SITE_OTHER): Payer: Medicare Other | Admitting: Cardiovascular Disease

## 2016-04-05 ENCOUNTER — Encounter: Payer: Self-pay | Admitting: Cardiovascular Disease

## 2016-04-05 VITALS — BP 130/68 | HR 94 | Ht 63.0 in | Wt 166.0 lb

## 2016-04-05 DIAGNOSIS — R071 Chest pain on breathing: Secondary | ICD-10-CM | POA: Diagnosis not present

## 2016-04-05 DIAGNOSIS — R002 Palpitations: Secondary | ICD-10-CM | POA: Diagnosis not present

## 2016-04-05 DIAGNOSIS — R0789 Other chest pain: Secondary | ICD-10-CM

## 2016-04-05 MED ORDER — DILTIAZEM HCL ER COATED BEADS 240 MG PO CP24: 240.0000 mg | ORAL_CAPSULE | Freq: Every day | ORAL | Status: DC

## 2016-04-05 NOTE — Patient Instructions (Signed)
Medication Instructions:  Your physician has recommended you make the following change in your medication:  INCREASE diltiazem to 240mg  once daily    Labwork: none  Testing/Procedures: none  Follow-Up: Your physician wants you to follow-up in: six months with Dr. Kirke CorinArida. You will receive a reminder letter in the mail two months in advance. If you don't receive a letter, please call our office to schedule the follow-up appointment.   Any Other Special Instructions Will Be Listed Below (If Applicable).     If you need a refill on your cardiac medications before your next appointment, please call your pharmacy.

## 2016-04-05 NOTE — Progress Notes (Signed)
Cardiology Office Note   Date:  04/05/2016   ID:  ISABELL BONAFEDE, DOB 02-20-1986, MRN [id number]  PCP:  Ruel Favors, MD  Cardiologist:   Lorine Bears, MD   Chief Complaint  Patient presents with  . other    Follow up from echo and 14 day event monitor. Pt. c/o palpitations with chest tightness.       History of Present Illness: Yesenia Wood is a 30 y.o. female who presents for a follow-up visit regarding palpitations thought to be due to inappropriate sinus tachycardia. She is accompanied by her mother and an interpreter .  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  Prior echo in [place] 2015 was completely normal. She underwent treadmill stress testing 06/23/2015 that was normal. However, her HR and BP were elevated at peak exercise, thus she was started on extended release Cardizem. She also wore a 48-hour Holter monitor given her palpitations that showed sinus tachycardia, she was advised to take Cardizem as above.    symptoms initially improved with diltiazem but recently she had recurrent palpitations associated with chest pain. A 2 week outpatient telemetry was performed which showed episodes of sinus tachycardia with no other significant arrhythmia. Many of her reported events correlated with sinus tachycardia.   Past Medical History  Diagnosis Date  . Hyperglycemia   . Obesity   . GERD (gastroesophageal reflux disease)   . Fibromyalgia   . Paresthesia   . Fatigue   . B12 deficiency   . Moderate asthma   . Bell's palsy     right sided  . Headache   . Deaf   . Premature birth   . Fatty liver   . High triglycerides     Past Surgical History  Procedure Laterality Date  . Bartholin gland cyst removal    . Laparoscopy    . Myringotomy with tube placement    . Cochlear implant    . Cochlear implant removal       Current  Outpatient Prescriptions  Medication Sig Dispense Refill  . albuterol (PROVENTIL HFA;VENTOLIN HFA) 108 (90 BASE) MCG/ACT inhaler Inhale 2 puffs into the lungs every 4 (four) hours as needed for wheezing or shortness of breath. 6.7 g 5  . CRANBERRY EXTRACT PO Take by mouth.    . Cyanocobalamin (RA VITAMIN B-12 TR) 1000 MCG TBCR Take by mouth.    . cyclobenzaprine (FLEXERIL) 10 MG tablet Take 1 tablet (10 mg total) by mouth daily as needed for muscle spasms. 180 tablet 1  . Fluticasone-Salmeterol (ADVAIR DISKUS) 500-50 MCG/DOSE AEPB Inhale 1 puff into the lungs 2 (two) times daily. 60 each 5  . levonorgestrel (MIRENA) 20 MCG/24HR IUD 1 Intra Uterine Device (1 each total) by Intrauterine route once. 1 each 0  . methylPREDNISolone (MEDROL DOSEPAK) 4 MG TBPK tablet     . Multiple Vitamin (MULTI-VITAMINS) TABS Take 1 tablet by mouth daily as needed.    . ranitidine (ZANTAC) 150 MG capsule TAKE 1 CAPSULE (150 MG TOTAL) BY MOUTH 2 (TWO) TIMES DAILY. 60 capsule 0  . rizatriptan (MAXALT) 10 MG tablet Take 1 tablet (10 mg total) by mouth as needed for migraine. May repeat in 2 hours if needed 10 tablet 0  . traMADol (ULTRAM) 50 MG tablet TAKE ONE TABLET BY MOUTH EVERY 8 HOURS AS NEEDED 90 tablet 1  . triamcinolone cream (KENALOG) 0.1 %  Apply 1 application topically 2 (two) times daily. 80 g 0  . Butalbital-APAP-Caffeine 50-325-40 MG capsule Reported on 04/05/2016    . diltiazem (CARDIZEM CD) 240 MG 24 hr capsule Take 1 capsule (240 mg total) by mouth daily. 30 capsule 5   No current facility-administered medications for this visit.    Allergies:   Symbicort; Gabapentin; Nortriptyline; Diclofenac; Duloxetine hcl; Montelukast sodium; Mupirocin; Naproxen; and Sulfa antibiotics    Social History:  The patient  reports that she quit smoking about 9 years ago. Her smoking use included Cigarettes. She has a 2 pack-year smoking history. She has never used smokeless tobacco. She reports that she does not drink  alcohol or use illicit drugs.   Family History:  The patient's She was adopted. Family history is unknown by patient.    ROS:  Please see the history of present illness.   Otherwise, review of systems are positive for none.   All other systems are reviewed and negative.    PHYSICAL EXAM: VS:  BP 130/68 mmHg  Pulse 94  Ht 5\' 3"  (1.6 m)  Wt 166 lb (75.297 kg)  BMI 29.41 kg/m2 , BMI Body mass index is 29.41 kg/(m^2). GEN: Well nourished, well developed, in no acute distress HEENT: normal Neck: no JVD, carotid bruits, or masses Cardiac: RRR; no murmurs, rubs, or gallops,no edema  Respiratory:  clear to auscultation bilaterally, normal work of breathing GI: soft, nontender, nondistended, + BS MS: no deformity or atrophy Skin: warm and dry, no rash Neuro:  Strength and sensation are intact Psych: euthymic mood, full affect   EKG:  EKG is ordered today. The ekg ordered today demonstrates normal sinus rhythm with no significant ST or T wave changes.   Recent Labs: 10/29/2015: ALT 59* 03/02/2016: BUN 9; Creatinine, Ser 0.73; Hemoglobin 14.2; Magnesium 2.0; Platelets 351; Potassium 3.9; Sodium 131*; TSH 1.649    Lipid Panel    Component Value Date/Time   CHOL 192 10/29/2015 1050   CHOL 270* 11/24/2014   TRIG 128 10/29/2015 1050   HDL 43 10/29/2015 1050   HDL 85* 11/24/2014   CHOLHDL 4.5* 10/29/2015 1050   LDLCALC 123* 10/29/2015 1050   LDLCALC 136 11/24/2014      Wt Readings from Last 3 Encounters:  04/05/16 166 lb (75.297 kg)  03/21/16 173 lb (78.472 kg)  03/16/16 176 lb 4.8 oz (79.969 kg)       ASSESSMENT AND PLAN:  1.  Palpitations: Due to suspected inappropriate sinus tachycardia. Given her continued symptoms, I elected to increase the dose to 240 mg once daily.  2. Shortness of breath: Recent echocardiogram was completely normal. The dyspnea is likely not cardiac related.    Disposition:   FU with me in 6 months  Signed,   Lorine BearsMuhammad Arida, MD  04/05/2016  5:18 PM    [facility]

## 2016-04-18 DIAGNOSIS — G43719 Chronic migraine without aura, intractable, without status migrainosus: Secondary | ICD-10-CM | POA: Diagnosis not present

## 2016-04-18 DIAGNOSIS — G43109 Migraine with aura, not intractable, without status migrainosus: Secondary | ICD-10-CM | POA: Diagnosis not present

## 2016-04-18 DIAGNOSIS — G43111 Migraine with aura, intractable, with status migrainosus: Secondary | ICD-10-CM | POA: Diagnosis not present

## 2016-04-19 ENCOUNTER — Telehealth: Payer: Self-pay | Admitting: Internal Medicine

## 2016-04-19 NOTE — Telephone Encounter (Signed)
Would we not send pt for sleep study? Please advise.

## 2016-04-19 NOTE — Telephone Encounter (Signed)
pt mother calling stating pt is in need of sleep study referral from Dr Neale BurlyFreeman, [place] FaceMarshall  Please advise.

## 2016-04-20 ENCOUNTER — Encounter: Payer: Self-pay | Admitting: Family Medicine

## 2016-04-20 DIAGNOSIS — J301 Allergic rhinitis due to pollen: Secondary | ICD-10-CM | POA: Diagnosis not present

## 2016-04-20 NOTE — Telephone Encounter (Signed)
During her past visits with Pulmonary, she has not expressed any signs, symptoms or concerns that would warrant a sleep study.  We can evaluate her for a sleep study.  If another physician has already seen her and has evaluated her, then they can refer her for a sleep study.   -VM

## 2016-04-20 NOTE — Telephone Encounter (Signed)
Spoke with pt's mother and she states pt went to see a migraine specialist that stated pt needed to see someone for sleep and they were going to refer her to us but since she is an established pt they told her to call and schedule an appt. appt scheduled for sleep consult. Nothing further needed.

## 2016-05-02 ENCOUNTER — Ambulatory Visit: Payer: Medicare Other | Admitting: Family Medicine

## 2016-05-05 DIAGNOSIS — M791 Myalgia: Secondary | ICD-10-CM | POA: Diagnosis not present

## 2016-05-05 DIAGNOSIS — G43719 Chronic migraine without aura, intractable, without status migrainosus: Secondary | ICD-10-CM | POA: Diagnosis not present

## 2016-05-05 DIAGNOSIS — G518 Other disorders of facial nerve: Secondary | ICD-10-CM | POA: Diagnosis not present

## 2016-05-05 DIAGNOSIS — R51 Headache: Secondary | ICD-10-CM | POA: Diagnosis not present

## 2016-05-05 DIAGNOSIS — M542 Cervicalgia: Secondary | ICD-10-CM | POA: Diagnosis not present

## 2016-05-05 DIAGNOSIS — G43111 Migraine with aura, intractable, with status migrainosus: Secondary | ICD-10-CM | POA: Diagnosis not present

## 2016-05-05 DIAGNOSIS — G43109 Migraine with aura, not intractable, without status migrainosus: Secondary | ICD-10-CM | POA: Diagnosis not present

## 2016-05-10 ENCOUNTER — Ambulatory Visit (INDEPENDENT_AMBULATORY_CARE_PROVIDER_SITE_OTHER): Payer: Medicare Other | Admitting: Internal Medicine

## 2016-05-10 ENCOUNTER — Encounter: Payer: Self-pay | Admitting: Internal Medicine

## 2016-05-10 VITALS — BP 122/64 | HR 101 | Ht 63.0 in | Wt 158.0 lb

## 2016-05-10 DIAGNOSIS — G479 Sleep disorder, unspecified: Secondary | ICD-10-CM

## 2016-05-10 DIAGNOSIS — J4540 Moderate persistent asthma, uncomplicated: Secondary | ICD-10-CM | POA: Diagnosis not present

## 2016-05-10 NOTE — Patient Instructions (Signed)
Follow up with Dr. Dema SeverinMungal in: 3 months - We'll schedule a sleep study for you, NPSG. -Continue diet, exercise, weight loss regiment -Continue current inhalers -Continue recommendations as outlined by Dr. Neale BurlyFreeman for chronic migraine headaches -Continue with allergy control.

## 2016-05-10 NOTE — Assessment & Plan Note (Signed)
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 Role of environmental atopic triggers now is unclear. Total IgE not high, and reported in vitro test showed only dust mites. Allergy skin testing 03/04/15- Strong Positive esp for trees and dust mites, some molds.  Currently patient is receiving allergy shots at ENT office. Current regimen is: Advair 500/50, rescue inhaler, allergy shots. IgE= 17, Abs eosinophil = 0.1 >>both normal Using albuterol 1-2 times in 2 weeks.  Avoidance of triggers is paramount to her quality of life, however this is difficult given that her triggers are mostly environmentally related. I have discussed starting Xolair with ENT, they currently see no contraindication but since she is on suboptimal doses of allergy shots Xolair is not recommended at this time.  Following with cardiology for episodes of tachycardia/palpitations, currently wearing a Holter monitor for total of 2 weeks. She is also on diltiazem. At this time cannot switch her from albuterol to Xopenex due to allergy component in Xopenex. In any event, her albuterol usage is significantly decrease and is not clinically adding to tachycardia/palpitations.  Allergy shots on hold due to having fevers with migraine headaches.   Plan: -Continue with allergy regiment, continue following with cardiology Tachycardia/palpitations. -We will start weaning Advair once her use of albuterol has decreased -Avoid asthma triggers as much as tolerable. -Overall with stable asthma.

## 2016-05-10 NOTE — Progress Notes (Signed)
[facility] Pulmonary Medicine Consultation      MRN# [id number] [name] 21-Oct-1986   CC: Chief Complaint  Patient presents with  . sleep consult    pt ref by Dr. Neale Burly      Brief History: 10/07/2014 History: She is a pleasant 30 year old female accompanied by her mother and interpreter, seen in consultation for worsening asthma and shortness of breath, referred by Dr. Carlynn Purl.  History per the patient and review of medical records Patient is a past medical history of deafness, status post cochlear implant with failure, fibromyalgia, B12 deficiency, moderate asthma, obesity. Her asthma history is as follows: As a teenager exercise-induced bronchospasms that was treated with as needed albuterol, and her early 67s started having more shortness of breath and was previously on Qvar. Triggers: Unsure, mild exacerbation with seasonal changes, more shortness of breath with warm weather (this is very mild). Patient cannot identify any inciting factor 2-3 months ago that made her breathing more difficult. She had a spirometry done at her previous pulmonologist in [place], 07/11/2013, FEV1 79, FEV1/FVC 74. Her repeat spirometry at [facility] showed FEV1 48%, FEV1/FVC 66% on 09/02/2014. There has been no significantrespiratory illness in the past year the patient or her mother can identify a significant drop in her FEV1 and worsening of her symptoms. 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 She previously smoked one to 2 packs per day for 2 years and quit 8 years ago. She denies any E. Cigarettes or water-based tobacco products. Patient has a dog, Yorkie, that stay her,she states that she is very clean, does not have a lot of carpeting, no roaches or rodent infestations in her home. 2 months ago she was diagnosed with a urinary tract infection, shortly after that chest tightness and difficulty  breathing, went to the ER, had a negative d-dimer, will was told she had chest wall discomfort and was given a prednisone taper for shortness of breath. Upon completion of her prednisone taper she develop right-sided facial Bell's palsy. She has also had testing for Lyme, which was negative. On 09/16/2014 her primary care physician changed her controller medication to Advair 250/50, from Qvar, she is also on Ventolin, which she is currently using 3-4 times per day for chest tightness and shortness of breath: She states this does not provide significant relief. She denies fever, chills. 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 She could not recall any tick bites. Patient states that she has a peak flow meter at home and uses it every morning, currently she is averaging about 300 for the past one month, which is the yellow zone for her. Patient denies any cough, runny nose, mucus production.  PLAN: - CT Chest, IgE and IgG level, alpha1-antitrypsin, cardiac ECHO (bubble studies)   ROV 11/12/14 Patient presents today for a follow up visit of asthma (uncontrolled) Advair increased at last visit, no significant improvement.  At this point still with sob, and chest tightness, using albuterol 2-3 times a day for chest tightness and wheezing and "vibration" in chest.  Currently seeing ENT (Dr. 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 She would also like to discuss the results of her labs and imaging studies. Mother present at today's visit.  Plan - mild asthma exacerbation - Prednisone taper  over 1 month, advair, prn albuterol   ROV 03/04/15 Since her last visit she has had a 1 month trial of prednisone, which greatly improved her symptoms (chest tightness and sob). She stated that she complete steroid last week, and this week the symptoms are back, she is currently using  albuterol 2-3 times per day; previously on steroid maybe 1-2 times per day. Her family is planning to remove the rugs\carpet and get hypoallergenic pillows and sheets, due to dust mite allergy.  She has seen ENT (Dr. Andee Poles) and was told that her vocal cords were fine and was told that she had dust mite allergy. She had RAST testing done.  She has not had allergy testing since childhood.  Plan - allergy testing [facility] [place] (Dr. Maple Hudson), prednisone  daily, follow up with ENT  ROV 05/14/15 [name] presents today for a follow up visit of her asthma, which has been difficult to control. Her asthma she is currently on Advair 500/50, prednisone 5 mg daily, and as needed albuterol. She states that she's currently using albuterol 3-4 times a day for the past 2-3 weeks. At this time her triggers are heat and rapid weather changes. Since her last visit she has seen ENT, Dr. Andee Poles who stated that she had severe allergies to pollen, dust mites, grass/ragweed. She is currently receiving allergy shots 2 times per week. Also she has stopped pulmonary rehabilitation due to not providing any benefit. She was complaining of right sided stomach pain and flank pain,along with constipation to her primary care physician, which prompted an ultrasound of her abdomen; ultrasound yielded fatty liver. Today she is complaining of chest tightness, and some shortness of breath, but this is tolerable for her. Over the last 2-3 months she states that she's also complaining of chest pain/chest discomfort, she states that her chest feels tight and she becomes diaphoretic and very short winded. When this occurs she uses her albuterol, but it does not help much. This chest discomfort has increased over the last 3 months.  Per her visit with Dr. Maple Hudson April 2016: Allergy skin testing 03/04/15- Strong Positive esp for trees and dust mites, some molds.  Plan - cont with asthma regiment, cardiology evaluation.   ROV  05/2015 Patient presents for follow up visit of her asthma. Still with cough and shortness of breath at times. Currently getting allergy shots,twice weekly, this is not maintenance dose per the mother. Patient is accompanied by mother and sign language interpreter.  Still on prednisone and allergy shots. Patient had holter monitoring (48 hours) and treadmill stress test performed by Cardiology, results are pending.  Plan: -Continue with current allergy shots and current asthma regimen. -We'll start weaning Advair at next visit - Start prednisone wean as stated above  -Avoid asthma triggers as much as tolerable.  08/2015: Patient is well known to the pulmonary clinic, has moderate to severe uncontrolled asthma, requiring daily albuterol use. She's had a thorough workup for asthma including cardiac and pulmonary evaluation. Since her last visit she has seen cardiology, they have started her on Cardizem for high blood pressure and tachycardia.  She is accompanied today by her mother and hearing interpreter. Since her last visit she has weaned off prednisone 2.5 mg daily. She states that her breathing is slightly improved since being on Cardizem and becoming a little bit more active in terms of exercise. She still using albuterol daily, but is down from 5 times per day to 3 times per day. She does not endorse any  wheezing but does endorse shortness of breath. She also states that she is increase her level of activity, she tries to walk on a daily basis with her dog. She is getting allergy shots currently not at the maintenance level, but is being followed closely by ENT. -Her primary care physician has started her on valacyclovir for Bell's palsy. Plan: -Continue with current allergy shots and current asthma regimen. -We will start weaning Advair once her use of albuterol has decreased -Avoid asthma triggers as much as tolerable.  Events since last clinic visit: Patient presents today  for follow-up visit of her asthma. She is accompanied by her mother. Interpreter Carline available at today's visit.  She was started following a new urologist for chronic migraine headaches. This neurologist Dr. Neale Burly, has prescribed her Keppra and also recommended a sleep study given her chronic migraines inability to control her weight and also moderate persistent asthma. Today her Epworth score is 4, STOP BANG score  Is 3 She has lost weight since her last visit, she is no longer taking allergy shots at this time due to having fevers or chronic migraines; therefore cannot get allergy shots while having fever. She is using albuterol intermittently, and using her regular inhalers as directed. Her new medication include Keppra. 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  Obstructive Sleep Apnea Screening The patient was screened with the STOP-BANG questionnaire. >3 positive responses is considered a positive screen  Snoring  YES Tiredness  YES Observed Apnea YES Pressure (HTN) YES BMI >35  NO Age > 50  NO Neck >17"  NO Gender (female) NO Total: 3/8  Screen: POSITIVE      Medication:   Current Outpatient Rx  Name  Route  Sig  Dispense  Refill  . albuterol (PROVENTIL HFA;VENTOLIN HFA) 108 (90 BASE) MCG/ACT inhaler   Inhalation   Inhale 2 puffs into the lungs every 4 (four) hours as needed for wheezing or shortness of breath.   6.7 g   5   . diltiazem (CARDIZEM CD) 240 MG 24 hr capsule   Oral   Take 1 capsule (240 mg total) by mouth daily.   30 capsule   5   . Fluticasone-Salmeterol (ADVAIR DISKUS) 500-50 MCG/DOSE AEPB   Inhalation   Inhale 1 puff into the lungs 2 (two) times daily.   60 each   5   . levonorgestrel (MIRENA) 20 MCG/24HR IUD   Intrauterine   1 Intra Uterine Device (1 each total) by Intrauterine route once.   1 each   0   . Multiple Vitamin (MULTI-VITAMINS) TABS   Oral   Take 1 tablet by mouth daily as needed.          . triamcinolone cream (KENALOG) 0.1 %   Topical   Apply 1 application topically 2 (two) times daily.   80 g   0      Review of Systems  Constitutional: Negative for fever, chills, weight loss, malaise/fatigue and diaphoresis.  HENT: Negative for congestion, ear discharge, ear pain, nosebleeds, sore throat and tinnitus.   Eyes: Negative for blurred vision and double vision.  Respiratory: Positive for shortness of breath. Negative for cough, sputum production, wheezing and stridor.        Baseline shortness of breath especially with exertion   Cardiovascular: Negative for chest pain and leg swelling.  Gastrointestinal: Negative for heartburn, nausea and vomiting.  Genitourinary: Negative for dysuria.  Musculoskeletal: Negative for myalgias.  Skin: Negative for itching  and rash.  Neurological: Negative for dizziness, weakness and headaches.  Endo/Heme/Allergies: Does not bruise/bleed easily.  Psychiatric/Behavioral: Negative for depression.      Allergies:  Symbicort; Gabapentin; Nortriptyline; Diclofenac; Duloxetine hcl; Montelukast sodium; Mupirocin; Naproxen; and Sulfa antibiotics  Physical Examination:  VS: BP 122/64 mmHg  Pulse 101  Ht 5\' 3"  (1.6 m)  Wt 158 lb (71.668 kg)  BMI 28.00 kg/m2  SpO2 94%  General Appearance: No distress  HEENT: PERRLA, no ptosis, no other lesions noticed.  Mallampati:1 Pulmonary:   Shallow breath sounds, poor respiratory effort with deep inspiration, mid chest tenderness over the sternum with mild to mod palpation Cardiovascular:  Normal S1,S2.  No m/r/g.     Abdomen:Exam: Benign, Soft, non-tender, No masses  Skin:   warm, no rashes, no ecchymosis  Extremities: normal, no cyanosis, clubbing, warm with normal capillary refill.    Assessment and Plan: 30 year old female seen in follow-up for moderate-severe persistent asthma and sleep evaluation.  Moderate persistent intrinsic asthma without status asthmaticus without  complication Atopic as a small child. Role of environmental atopic triggers now is unclear. Total IgE not high, and reported in vitro test showed only dust mites. Allergy skin testing 03/04/15- Strong Positive esp for trees and dust mites, some molds.  Currently patient is receiving allergy shots at ENT office. Current regimen is: Advair 500/50, rescue inhaler, allergy shots. IgE= 17, Abs eosinophil = 0.1 >>both normal Using albuterol 1-2 times in 2 weeks.  Avoidance of triggers is paramount to her quality of life, however this is difficult given that her triggers are mostly environmentally related. I have discussed starting Xolair with ENT, they currently see no contraindication but since she is on suboptimal doses of allergy shots Xolair is not recommended at this time.  Following with cardiology for episodes of tachycardia/palpitations, currently wearing a Holter monitor for total of 2 weeks. She is also on diltiazem. At this time cannot switch her from albuterol to Xopenex due to allergy component in Xopenex. In any event, her albuterol usage is significantly decrease and is not clinically adding to tachycardia/palpitations.  Allergy shots on hold due to having fevers with migraine headaches.   Plan: -Continue with allergy regiment, continue following with cardiology Tachycardia/palpitations. -We will start weaning Advair once her use of albuterol has decreased -Avoid asthma triggers as much as tolerable. -Overall with stable asthma.             Sleep disturbance STOP BANG score - 3 Epworth = 4  Symptoms - difficult to control weight loss, migraine headaches, difficult to control asthma  Plan - NPSG followed by titration study if needed.     Updated Medication List Outpatient Encounter Prescriptions as of 05/10/2016  Medication Sig  . albuterol (PROVENTIL HFA;VENTOLIN HFA) 108 (90 BASE) MCG/ACT inhaler Inhale 2 puffs into the lungs every 4 (four) hours as needed for  wheezing or shortness of breath.  Yesenia Wood. Butalbital-APAP-Caffeine [id number] MG capsule Reported on 04/05/2016  . CRANBERRY EXTRACT PO Take by mouth.  . Cyanocobalamin (RA VITAMIN B-12 TR) 1000 MCG TBCR Take by mouth.  . cyclobenzaprine (FLEXERIL) 10 MG tablet Take 1 tablet (10 mg total) by mouth daily as needed for muscle spasms.  Marland Kitchen. diltiazem (CARDIZEM CD) 240 MG 24 hr capsule Take 1 capsule (240 mg total) by mouth daily.  . Fluticasone-Salmeterol (ADVAIR DISKUS) 500-50 MCG/DOSE AEPB Inhale 1 puff into the lungs 2 (two) times daily.  Marland Kitchen. levonorgestrel (MIRENA) 20 MCG/24HR IUD 1 Intra Uterine Device (1 each total) by Intrauterine  route once.  . Multiple Vitamin (MULTI-VITAMINS) TABS Take 1 tablet by mouth daily as needed.  . ranitidine (ZANTAC) 150 MG capsule TAKE 1 CAPSULE (150 MG TOTAL) BY MOUTH 2 (TWO) TIMES DAILY.  . rizatriptan (MAXALT) 10 MG tablet Take 1 tablet (10 mg total) by mouth as needed for migraine. May repeat in 2 hours if needed  . traMADol (ULTRAM) 50 MG tablet TAKE ONE TABLET BY MOUTH EVERY 8 HOURS AS NEEDED  . triamcinolone cream (KENALOG) 0.1 % Apply 1 application topically 2 (two) times daily.   No facility-administered encounter medications on file as of 05/10/2016.    Orders for this visit: No orders of the defined types were placed in this encounter.    Thank  you for the visitation and for allowing  [facility] to assist in the care of your patient. Our recommendations are noted above.  Please contact us if we can be of further service.  Stephanie Acre, MD [facility] Office Number: [phone]  Note: This note was prepared with Dragon dictation along with smaller phrase technology. Any transcriptional errors that result from this process are unintentional.

## 2016-05-10 NOTE — Assessment & Plan Note (Signed)
STOP BANG score - 3 Epworth = 4  Symptoms - difficult to control weight loss, migraine headaches, difficult to control asthma  Plan - NPSG followed by titration study if needed.

## 2016-05-17 DIAGNOSIS — G629 Polyneuropathy, unspecified: Secondary | ICD-10-CM | POA: Diagnosis not present

## 2016-06-02 DIAGNOSIS — G518 Other disorders of facial nerve: Secondary | ICD-10-CM | POA: Diagnosis not present

## 2016-06-02 DIAGNOSIS — G43109 Migraine with aura, not intractable, without status migrainosus: Secondary | ICD-10-CM | POA: Diagnosis not present

## 2016-06-02 DIAGNOSIS — G43719 Chronic migraine without aura, intractable, without status migrainosus: Secondary | ICD-10-CM | POA: Diagnosis not present

## 2016-06-02 DIAGNOSIS — G43111 Migraine with aura, intractable, with status migrainosus: Secondary | ICD-10-CM | POA: Diagnosis not present

## 2016-06-02 DIAGNOSIS — M791 Myalgia: Secondary | ICD-10-CM | POA: Diagnosis not present

## 2016-06-02 DIAGNOSIS — M542 Cervicalgia: Secondary | ICD-10-CM | POA: Diagnosis not present

## 2016-06-02 DIAGNOSIS — R51 Headache: Secondary | ICD-10-CM | POA: Diagnosis not present

## 2016-06-07 ENCOUNTER — Encounter: Payer: Self-pay | Admitting: Family Medicine

## 2016-06-07 ENCOUNTER — Ambulatory Visit (INDEPENDENT_AMBULATORY_CARE_PROVIDER_SITE_OTHER): Payer: Medicare Other | Admitting: Family Medicine

## 2016-06-07 VITALS — BP 114/64 | HR 96 | Temp 99.4°F | Resp 18 | Ht 63.0 in | Wt 159.8 lb

## 2016-06-07 DIAGNOSIS — F602 Antisocial personality disorder: Secondary | ICD-10-CM | POA: Diagnosis not present

## 2016-06-07 DIAGNOSIS — J4541 Moderate persistent asthma with (acute) exacerbation: Secondary | ICD-10-CM

## 2016-06-07 DIAGNOSIS — Z79899 Other long term (current) drug therapy: Secondary | ICD-10-CM | POA: Diagnosis not present

## 2016-06-07 DIAGNOSIS — E871 Hypo-osmolality and hyponatremia: Secondary | ICD-10-CM

## 2016-06-07 DIAGNOSIS — R202 Paresthesia of skin: Secondary | ICD-10-CM

## 2016-06-07 DIAGNOSIS — R5383 Other fatigue: Secondary | ICD-10-CM

## 2016-06-07 DIAGNOSIS — R509 Fever, unspecified: Secondary | ICD-10-CM

## 2016-06-07 DIAGNOSIS — E538 Deficiency of other specified B group vitamins: Secondary | ICD-10-CM | POA: Diagnosis not present

## 2016-06-07 DIAGNOSIS — G43709 Chronic migraine without aura, not intractable, without status migrainosus: Secondary | ICD-10-CM

## 2016-06-07 NOTE — Progress Notes (Signed)
Name: Yesenia Wood   MRN: [id number]    DOB: 04/13/1986   Date:06/07/2016       Progress Note  Subjective  Chief Complaint  Chief Complaint  Patient presents with  . Medication Refill    3 month F/U, Patient is scheduled for Sleep Study since she does not sleep well, only sleeps 2 hours nightly on Friday June 17, 2016  . Numbness    Patient followed up with Dr. Malvin JohnsPotter for right arm NCS-Normal Dr. Neale BurlyFreeman for right leg NCS-Normal. They wanted Dr. Carlynn PurlSowles to re-evaluate symptoms and treatment plan.  . Migraine    Patient has had a non-stop Migraine since May, [facility] and was told to stop Tramadol, B12 and Flexeril. Also was told to stop dairy products, change her diet, lose weight down to 135 lbs. Dr. 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 Currently not having the change in personality. She has "Yesenia Wood" principal personality. Personality 2 is Yesenia Wood : cruel, risk taker, swearing/cursing, suicidal. 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  Mother states that she has not had those symptoms in a long time.   Disruptive sleep: she used to  Take  naps during the day, interrupted sleep at night. Dr. Neale BurlyFreeman told her not to nap - but still has problems sleeping at night. Dr Neale BurlyFreeman has ordered a sleep study.   Numbness: she had 2 NCS/EMG, MRI of brain and c-spine and is all normal, explained that may be secondary to FMS-  She is off B12 for the past 2 months as recommended by Dr. Neale BurlyFreeman and we will recheck level.   Migraine headaches: she is off pain medications, seeing Dr. Neale BurlyFreeman and having trigger point injections, but continues to have symptoms. She has daily headache for about 2 months except for a few days. No headaches for 2 days after first session of trigger points. Last time she got worse, had a lot of pain on her muscles. She has pain all over her head, throbbing,  associated with photophobia and phonophobia, she needs to stay in a dark room.   Asthma: seeing Dr. Dema SeverinMungal and has been doing well on current regiment. Only has symptoms during activity, described as SOB and occasionally wheezing  Fever: she has recurrent symptoms of fever, fatigue, discussed lyme disease   Patient Active Problem List   Diagnosis Date Noted  . Sleep disturbance 05/10/2016  . Chronic migraine without aura 03/29/2016  . Costochondral chest pain 12/28/2015  . Pain in the chest 06/09/2015  . Palpitations 06/09/2015  . Hearing loss 05/16/2015  . B12 deficiency 05/16/2015  . Cochlear implant status 05/16/2015  . Fatty infiltration of liver 05/16/2015  . Fibromyalgia syndrome 05/16/2015  . Gastro-esophageal reflux disease without esophagitis 05/16/2015  . Generalized headache 05/16/2015  . Right-sided Bell's palsy 05/16/2015  . H/O: depression 05/16/2015  . Hypertriglyceridemia 05/16/2015  . Blood glucose elevated 05/16/2015  . Irregular menstrual cycle 05/16/2015  . Overweight 05/16/2015  . Paresthesia 05/16/2015  . Disorder of labyrinth 05/16/2015  . Dyspnea 10/07/2014  . Moderate persistent intrinsic asthma without status asthmaticus without complication 10/07/2014    Past Surgical History  Procedure Laterality Date  . Bartholin gland cyst removal    . Laparoscopy    . Myringotomy with tube placement    . Cochlear implant    . Cochlear implant removal      Family History  Problem Relation  Age of Onset  . Adopted: Yes  . Family history unknown: Yes    Social History   Social History  . Marital Status: Single    Spouse Name: N/A  . Number of Children: N/A  . Years of Education: N/A   Occupational History  . Not on file.   Social History Main Topics  . Smoking status: Former Smoker -- 1.00 packs/day for 2 years    Types: Cigarettes    Quit date: 11/28/2006  . Smokeless tobacco: Never Used  . Alcohol Use: No     Comment: rarely  . Drug Use:  No  . Sexual Activity: Yes    Birth Control/ Protection: Pill   Other Topics Concern  . Not on file   Social History Narrative     Current outpatient prescriptions:  .  albuterol (PROVENTIL HFA;VENTOLIN HFA) 108 (90 BASE) MCG/ACT inhaler, Inhale 2 puffs into the lungs every 4 (four) hours as needed for wheezing or shortness of breath., Disp: 6.7 g, Rfl: 5 .  baclofen (LIORESAL) 10 MG tablet, Take 1 tablet by mouth once a week. migraine, Disp: , Rfl:  .  diltiazem (CARDIZEM CD) 240 MG 24 hr capsule, Take 1 capsule (240 mg total) by mouth daily., Disp: 30 capsule, Rfl: 5 .  Fluticasone-Salmeterol (ADVAIR DISKUS) 500-50 MCG/DOSE AEPB, Inhale 1 puff into the lungs 2 (two) times daily., Disp: 60 each, Rfl: 5 .  levETIRAcetam (KEPPRA) 250 MG tablet, , Disp: , Rfl:  .  levonorgestrel (MIRENA) 20 MCG/24HR IUD, 1 Intra Uterine Device (1 each total) by Intrauterine route once., Disp: 1 each, Rfl: 0 .  Multiple Vitamin (MULTI-VITAMINS) TABS, Take 1 tablet by mouth daily as needed., Disp: , Rfl:  .  triamcinolone cream (KENALOG) 0.1 %, Apply 1 application topically 2 (two) times daily., Disp: 80 g, Rfl: 0  Allergies  Allergen Reactions  . Symbicort [Budesonide-Formoterol Fumarate] Anaphylaxis and Other (See Comments)    Chest pain  . Gabapentin Nausea And Vomiting  . Nortriptyline     Other reaction(s): Other (See Comments)  . Diclofenac Other (See Comments)  . Duloxetine Hcl Other (See Comments)  . Montelukast Sodium Other (See Comments)  . Mupirocin Other (See Comments)  . Naproxen Other (See Comments)  . Sulfa Antibiotics Hives     ROS  Constitutional: Positive  for fever no  weight change.  Respiratory:Posittive  for intermittent nocturnal cough and shortness of breath.   Cardiovascular: Negative for chest pain , positive for occasional  Palpitations ( doing better on Cardizem ).  Gastrointestinal: Negative for abdominal pain, no bowel changes.  Musculoskeletal: Negative for gait  problem or joint swelling.  Skin: Negative for rash. Intermittent on face , neck and legs Neurological: Positive  for dizziness and  headache.  No other specific complaints in a complete review of systems (except as listed in HPI above).  Objective  Filed Vitals:   06/07/16 1144  BP: 114/64  Pulse: 96  Temp: 99.4 F (37.4 C)  TempSrc: Oral  Resp: 18  Height: 5\' 3"  (1.6 m)  Weight: 159 lb 12.8 oz (72.485 kg)  SpO2: 97%    Body mass index is 28.31 kg/(m^2).  Physical Exam  Constitutional: Patient appears well-developed and well-nourished. Obese  No distress.  HEENT: head atraumatic, normocephalic, pupils equal and reactive to light, neck supple, throat within normal limits Cardiovascular: Normal rate, regular rhythm and normal heart sounds.  No murmur heard. No BLE edema. Pulmonary/Chest: Effort normal and breath sounds normal. No respiratory  distress. Abdominal: Soft.  There is no tenderness. Psychiatric: Patient has a normal mood and affect. behavior is normal. Judgment and thought content normal. Muscular Skeletal: trigger point positive   PHQ2/9: Depression screen [facility] 2/9 06/07/2016 03/16/2016 01/28/2016 10/01/2015 07/20/2015  Decreased Interest 0 0 0 0 0  Down, Depressed, Hopeless 0 0 0 0 1  PHQ - 2 Score 0 0 0 0 1     Fall Risk: Fall Risk  06/07/2016 03/16/2016 01/28/2016 10/01/2015 07/20/2015  Falls in the past year? No Yes Yes Yes Yes  Number falls in past yr: - or more 1  Injury with Fall? - Yes No No No  Risk Factor Category  - - - - -    Functional Status Survey: Is the patient deaf or have difficulty hearing?: Yes (Deaf) Does the patient have difficulty seeing, even when wearing glasses/contacts?: No Does the patient have difficulty concentrating, remembering, or making decisions?: No Does the patient have difficulty walking or climbing stairs?: No Does the patient have difficulty dressing or bathing?: No Does the patient have difficulty doing errands alone  such as visiting a doctor's office or shopping?: Yes (Patient does not drive)    Assessment & Plan  1. Chronic migraine without aura without status migrainosus, not intractable  Keep follow up with Dr. Neale Burly  2. Asthma, extrinsic, moderate persistent, with acute exacerbation  Continue follow up with Dr. Dema Severin   3. Paresthesia  Check labs, it may be secondary to FMS  4. B12 deficiency  - Vitamin B12  5. Low sodium levels  - Basic metabolic panel; Future  6. Low calcium levels  - Basic metabolic panel; Future  7. Long-term use of high-risk medication  - Basic metabolic panel; Future  8. Dissocial personality disorder  Doing well at this time  9. Fever, unspecified fever cause  - CBC with Differential/Platelet - Comprehensive metabolic panel - C-reactive protein - Vitamin B12 - Sedimentation rate - Lyme Ab/Western Blot Reflex  10. Other fatigue  - CBC with Differential/Platelet - Comprehensive metabolic panel - C-reactive protein - Vitamin B12 - Sedimentation rate - Lyme Ab/Western Blot Reflex

## 2016-06-08 LAB — CBC WITH DIFFERENTIAL/PLATELET
BASOS ABS: 0 {cells}/uL (ref 0–200)
BASOS PCT: 0 %
EOS ABS: 103 {cells}/uL (ref 15–500)
EOS PCT: 1 %
HCT: 43.7 % (ref 35.0–45.0)
HEMOGLOBIN: 14.3 g/dL (ref 11.7–15.5)
LYMPHS ABS: 2678 {cells}/uL (ref 850–3900)
Lymphocytes Relative: 26 %
MCH: 27.8 pg (ref 27.0–33.0)
MCHC: 32.7 g/dL (ref 32.0–36.0)
MCV: 85 fL (ref 80.0–100.0)
MONO ABS: 721 {cells}/uL (ref 200–950)
MONOS PCT: 7 %
MPV: 10.2 fL (ref 7.5–12.5)
NEUTROS PCT: 66 %
Neutro Abs: 6798 cells/uL (ref 1500–7800)
PLATELETS: 322 10*3/uL (ref 140–400)
RBC: 5.14 MIL/uL — ABNORMAL HIGH (ref 3.80–5.10)
RDW: 14.7 % (ref 11.0–15.0)
WBC: 10.3 10*3/uL (ref 3.8–10.8)

## 2016-06-08 LAB — COMPREHENSIVE METABOLIC PANEL
ALBUMIN: 4.3 g/dL (ref 3.6–5.1)
ALK PHOS: 69 U/L (ref 33–115)
ALT: 33 U/L — AB (ref 6–29)
AST: 25 U/L (ref 10–30)
BILIRUBIN TOTAL: 0.4 mg/dL (ref 0.2–1.2)
BUN: 5 mg/dL — AB (ref 7–25)
CO2: 24 mmol/L (ref 20–31)
CREATININE: 0.82 mg/dL (ref 0.50–1.10)
Calcium: 9 mg/dL (ref 8.6–10.2)
Chloride: 106 mmol/L (ref 98–110)
Glucose, Bld: 87 mg/dL (ref 65–99)
Potassium: 4 mmol/L (ref 3.5–5.3)
SODIUM: 140 mmol/L (ref 135–146)
TOTAL PROTEIN: 6.8 g/dL (ref 6.1–8.1)

## 2016-06-08 LAB — SEDIMENTATION RATE: Sed Rate: 4 mm/hr (ref 0–20)

## 2016-06-08 LAB — VITAMIN B12: VITAMIN B 12: 578 pg/mL (ref 200–1100)

## 2016-06-08 LAB — LYME AB/WESTERN BLOT REFLEX: B burgdorferi Ab IgG+IgM: <0.9 Index (ref <0.90)

## 2016-06-08 LAB — C-REACTIVE PROTEIN

## 2016-06-14 DIAGNOSIS — R51 Headache: Secondary | ICD-10-CM | POA: Diagnosis not present

## 2016-06-14 DIAGNOSIS — M791 Myalgia: Secondary | ICD-10-CM | POA: Diagnosis not present

## 2016-06-14 DIAGNOSIS — G43111 Migraine with aura, intractable, with status migrainosus: Secondary | ICD-10-CM | POA: Diagnosis not present

## 2016-06-14 DIAGNOSIS — G43719 Chronic migraine without aura, intractable, without status migrainosus: Secondary | ICD-10-CM | POA: Diagnosis not present

## 2016-06-14 DIAGNOSIS — G43109 Migraine with aura, not intractable, without status migrainosus: Secondary | ICD-10-CM | POA: Diagnosis not present

## 2016-06-14 DIAGNOSIS — G518 Other disorders of facial nerve: Secondary | ICD-10-CM | POA: Diagnosis not present

## 2016-06-14 DIAGNOSIS — M542 Cervicalgia: Secondary | ICD-10-CM | POA: Diagnosis not present

## 2016-06-17 ENCOUNTER — Ambulatory Visit: Payer: Medicare Other | Attending: Pulmonary Disease

## 2016-06-17 DIAGNOSIS — J45909 Unspecified asthma, uncomplicated: Secondary | ICD-10-CM | POA: Diagnosis not present

## 2016-06-17 DIAGNOSIS — G4733 Obstructive sleep apnea (adult) (pediatric): Secondary | ICD-10-CM | POA: Diagnosis not present

## 2016-06-17 DIAGNOSIS — G479 Sleep disorder, unspecified: Secondary | ICD-10-CM | POA: Diagnosis present

## 2016-06-22 DIAGNOSIS — G4733 Obstructive sleep apnea (adult) (pediatric): Secondary | ICD-10-CM | POA: Diagnosis not present

## 2016-07-04 DIAGNOSIS — R51 Headache: Secondary | ICD-10-CM | POA: Diagnosis not present

## 2016-07-04 DIAGNOSIS — M542 Cervicalgia: Secondary | ICD-10-CM | POA: Diagnosis not present

## 2016-07-04 DIAGNOSIS — M791 Myalgia: Secondary | ICD-10-CM | POA: Diagnosis not present

## 2016-07-04 DIAGNOSIS — G43109 Migraine with aura, not intractable, without status migrainosus: Secondary | ICD-10-CM | POA: Diagnosis not present

## 2016-07-04 DIAGNOSIS — G43111 Migraine with aura, intractable, with status migrainosus: Secondary | ICD-10-CM | POA: Diagnosis not present

## 2016-07-04 DIAGNOSIS — G518 Other disorders of facial nerve: Secondary | ICD-10-CM | POA: Diagnosis not present

## 2016-07-04 DIAGNOSIS — G43719 Chronic migraine without aura, intractable, without status migrainosus: Secondary | ICD-10-CM | POA: Diagnosis not present

## 2016-07-05 ENCOUNTER — Other Ambulatory Visit: Payer: Self-pay | Admitting: *Deleted

## 2016-07-05 DIAGNOSIS — R0600 Dyspnea, unspecified: Secondary | ICD-10-CM

## 2016-07-05 MED ORDER — FLUTICASONE-SALMETEROL 500-50 MCG/DOSE IN AEPB: 1.0000 | INHALATION_SPRAY | Freq: Two times a day (BID) | RESPIRATORY_TRACT | 5 refills | Status: DC

## 2016-07-06 DIAGNOSIS — G8929 Other chronic pain: Secondary | ICD-10-CM | POA: Diagnosis not present

## 2016-07-06 DIAGNOSIS — R200 Anesthesia of skin: Secondary | ICD-10-CM | POA: Diagnosis not present

## 2016-07-06 DIAGNOSIS — G43709 Chronic migraine without aura, not intractable, without status migrainosus: Secondary | ICD-10-CM | POA: Diagnosis not present

## 2016-07-06 DIAGNOSIS — M545 Low back pain: Secondary | ICD-10-CM | POA: Diagnosis not present

## 2016-07-06 DIAGNOSIS — R531 Weakness: Secondary | ICD-10-CM | POA: Diagnosis not present

## 2016-07-06 DIAGNOSIS — M797 Fibromyalgia: Secondary | ICD-10-CM | POA: Diagnosis not present

## 2016-07-06 DIAGNOSIS — R202 Paresthesia of skin: Secondary | ICD-10-CM | POA: Diagnosis not present

## 2016-07-06 DIAGNOSIS — M79601 Pain in right arm: Secondary | ICD-10-CM | POA: Diagnosis not present

## 2016-07-07 NOTE — Progress Notes (Signed)
[facility]     Assessment and Plan:  Insomnia  --This patient has a long-standing history of insomnia, which goes back over 15 years. She appears to have some psychological stressors from that time which may be contributory. -We discussed in depth the various psychological and behavioral components of insomnia and how we could approach this. I'm going to give her prescription for Ambien CR 6.25 mg daily at bedtime. We discussed that while this may be helpful short-term, it is unlikely to be helpful in the long term. -I have asked her to download a sleep app to her smart phone, and track her daily sleep times. She is going to email [place] back in one month with total sleep time per night, and then we can consider starting sleep restriction therapy. -I have asked her to try to think about identify any negative or intrusive thoughts, which could be playing a role in her insomnia.   Upper airway resistance syndrome --this may become sleep apnea at some point in the future, and she could be retested at that time. However, at this time does not appear that this is playing a major role in her overall sleep issues. --She should avoid sleeping in the supine position.  --Weight loss would be beneficial for this.   Excessive daytime sleepiness.  --Due to inadequate sleep time, secondary to insomnia..    Date: 07/07/2016  MRN# [id number] Yesenia Wood 1985/12/13  Referred by Dr. Dema Severin.   Yesenia Wood is a 30 y.o. old female seen in follow up for chief complaint of  Chief Complaint  Patient presents with  . sleep consult    per VM.  pt had sleep study 06/17/16 here for results. c/o cont trouble sleeping.      HPI:   The patient is a 30 yo female recently seen by Dr. Dema Severin for symptoms asthma. She was noted to have excessive daytime sleepiness and had a positive STOP-BANG screen, therefore sent for sleep study which was positive for mild OSA.   She is deaf and is  present with interpreter and mother today. She notes that she was having trouble with daytime sleepiness. When waking in the am, she does not feel that she slept well.  She sits to read a book at 8 pm, gets in bed around 9 pm, it takes her some time to fall asleep, which she cant quantify, she feels that she falls asleep for short periods of time and wakes, and feels that she is awake most of the time. Her night of the sleep study was a typical night for her. As a teenager she was treated for depression and slept a lot then. She gets out of bed around 8 am, she wakes to an alarm, she works from home. Her weekends are the same schedule. She has seen a sleep specialist in the past when she was a teenager, but they did not find anything and did not require a CPAP. She has been on keppra a few months.  No sleep walking, sleep paralysis. She snores occasionally.   This has been going on for more than 10 years.  Her mother notes that she has ever really been in a sleep routine.  She has trouble getting tired and falling asleep.   She has tried yoga, reading before bed. She has been tried flexeril and tramadol for fibromyalgia which did not help sleep. She has never been tried on lunesta/ambien. She has been on melatonin as a  teenager not sure it helped.   Review of tracings, sleep study performed on 06/17/16; AHI was 0.8; The patient had elevated RERA in the supine position. Sleep efficiency was only 40%, sleep latency was 116 minutes  Medication:   Outpatient Encounter Prescriptions as of 07/11/2016  Medication Sig  . albuterol (PROVENTIL HFA;VENTOLIN HFA) 108 (90 BASE) MCG/ACT inhaler Inhale 2 puffs into the lungs every 4 (four) hours as needed for wheezing or shortness of breath.  . baclofen (LIORESAL) 10 MG tablet Take 1 tablet by mouth once a week. migraine  . diltiazem (CARDIZEM CD) 240 MG 24 hr capsule Take 1 capsule (240 mg total) by mouth daily.  . Fluticasone-Salmeterol (ADVAIR DISKUS) 500-50  MCG/DOSE AEPB Inhale 1 puff into the lungs 2 (two) times daily.  Marland Kitchen. levETIRAcetam (KEPPRA) 250 MG tablet   . levonorgestrel (MIRENA) 20 MCG/24HR IUD 1 Intra Uterine Device (1 each total) by Intrauterine route once.  . Multiple Vitamin (MULTI-VITAMINS) TABS Take 1 tablet by mouth daily as needed.  . triamcinolone cream (KENALOG) 0.1 % Apply 1 application topically 2 (two) times daily.   No facility-administered encounter medications on file as of 07/11/2016.      Allergies:  Symbicort [budesonide-formoterol fumarate]; Gabapentin; Nortriptyline; Diclofenac; Duloxetine hcl; Montelukast sodium; Mupirocin; Naproxen; and Sulfa antibiotics  Review of Systems: Gen:  Denies  fever, sweats. HEENT: Denies blurred vision. Cvc:  No dizziness, chest pain or heaviness Resp:   Denies cough or sputum porduction. Gi: Denies swallowing difficulty, stomach pain.incontinence Gu:  Denies bladder incontinence, burning urine Ext:   No Joint pain, stiffness. Skin: No skin rash, easy bruising. Endoc:  No polyuria, polydipsia. Psych: No depression, insomnia. Other:  All other systems were reviewed and found to be negative other than what is mentioned in the HPI.   Past history, family history, social history are as per Dr. Courtney ParisMungal's note from 05/10/16.  Physical Examination:   VS: BP 126/64 (BP Location: Left Arm, Cuff Size: Normal)   Pulse 90   Ht 5\' 3"  (1.6 m)   Wt 159 lb (72.1 kg)   SpO2 98%   BMI 28.17 kg/m   General Appearance: No distress  Neuro:without focal findings,  speech normal,  HEENT: PERRLA, EOM intact. Mallampati 2 Pulmonary: normal breath sounds, No wheezing.   CardiovascularNormal S1,S2.  No m/r/g.   Abdomen: Benign, Soft, non-tender. Renal:  No costovertebral tenderness  GU:  Not performed at this time. Endoc: No evident thyromegaly, no signs of acromegaly. Skin:   warm, no rash. Extremities: normal, no cyanosis, clubbing.   LABORATORY PANEL:   CBC No results for input(s):  WBC, HGB, HCT, PLT in the last 168 hours. ------------------------------------------------------------------------------------------------------------------  Chemistries  No results for input(s): NA, K, CL, CO2, GLUCOSE, BUN, CREATININE, CALCIUM, MG, AST, ALT, ALKPHOS, BILITOT in the last 168 hours.  Invalid input(s): GFRCGP ------------------------------------------------------------------------------------------------------------------  Cardiac Enzymes No results for input(s): TROPONINI in the last 168 hours. ------------------------------------------------------------  RADIOLOGY:   No results found for this or any previous visit. Results for orders placed during the hospital encounter of 12/31/15  DG Chest 2 View   Narrative CLINICAL DATA:  Chest pain and dizziness for 1 year  EXAM: CHEST  2 VIEW  COMPARISON:  10/10/2014  FINDINGS: The heart size and mediastinal contours are within normal limits. Both lungs are clear. The visualized skeletal structures are unremarkable.  IMPRESSION: No active cardiopulmonary disease.   Electronically Signed   By: Alcide CleverMark  Lukens M.D.   On: 12/31/2015 16:06    ------------------------------------------------------------------------------------------------------------------  Thank  you for allowing [facility] to assist in the care of your patient. Our recommendations are noted above.  Please contact us if we can be of further service.   Wells Guiles, MD.  [facility] Office Number: [phone]  Santiago Glad, M.D.  Stephanie Acre, M.D.  Billy Fischer, M.D  07/07/2016

## 2016-07-10 IMAGING — CR DG CHEST 2V
1 series · 2 of 2 positions shown · non-contrast
Comparison: None.

CLINICAL DATA: Chest pain and shortness of breath

EXAM:
CHEST  2 VIEW

[Series 1: dxr chest pa (or ap) and lateral · 0.14mm/px · 2 of 2 slices shown]
[im 1/2]
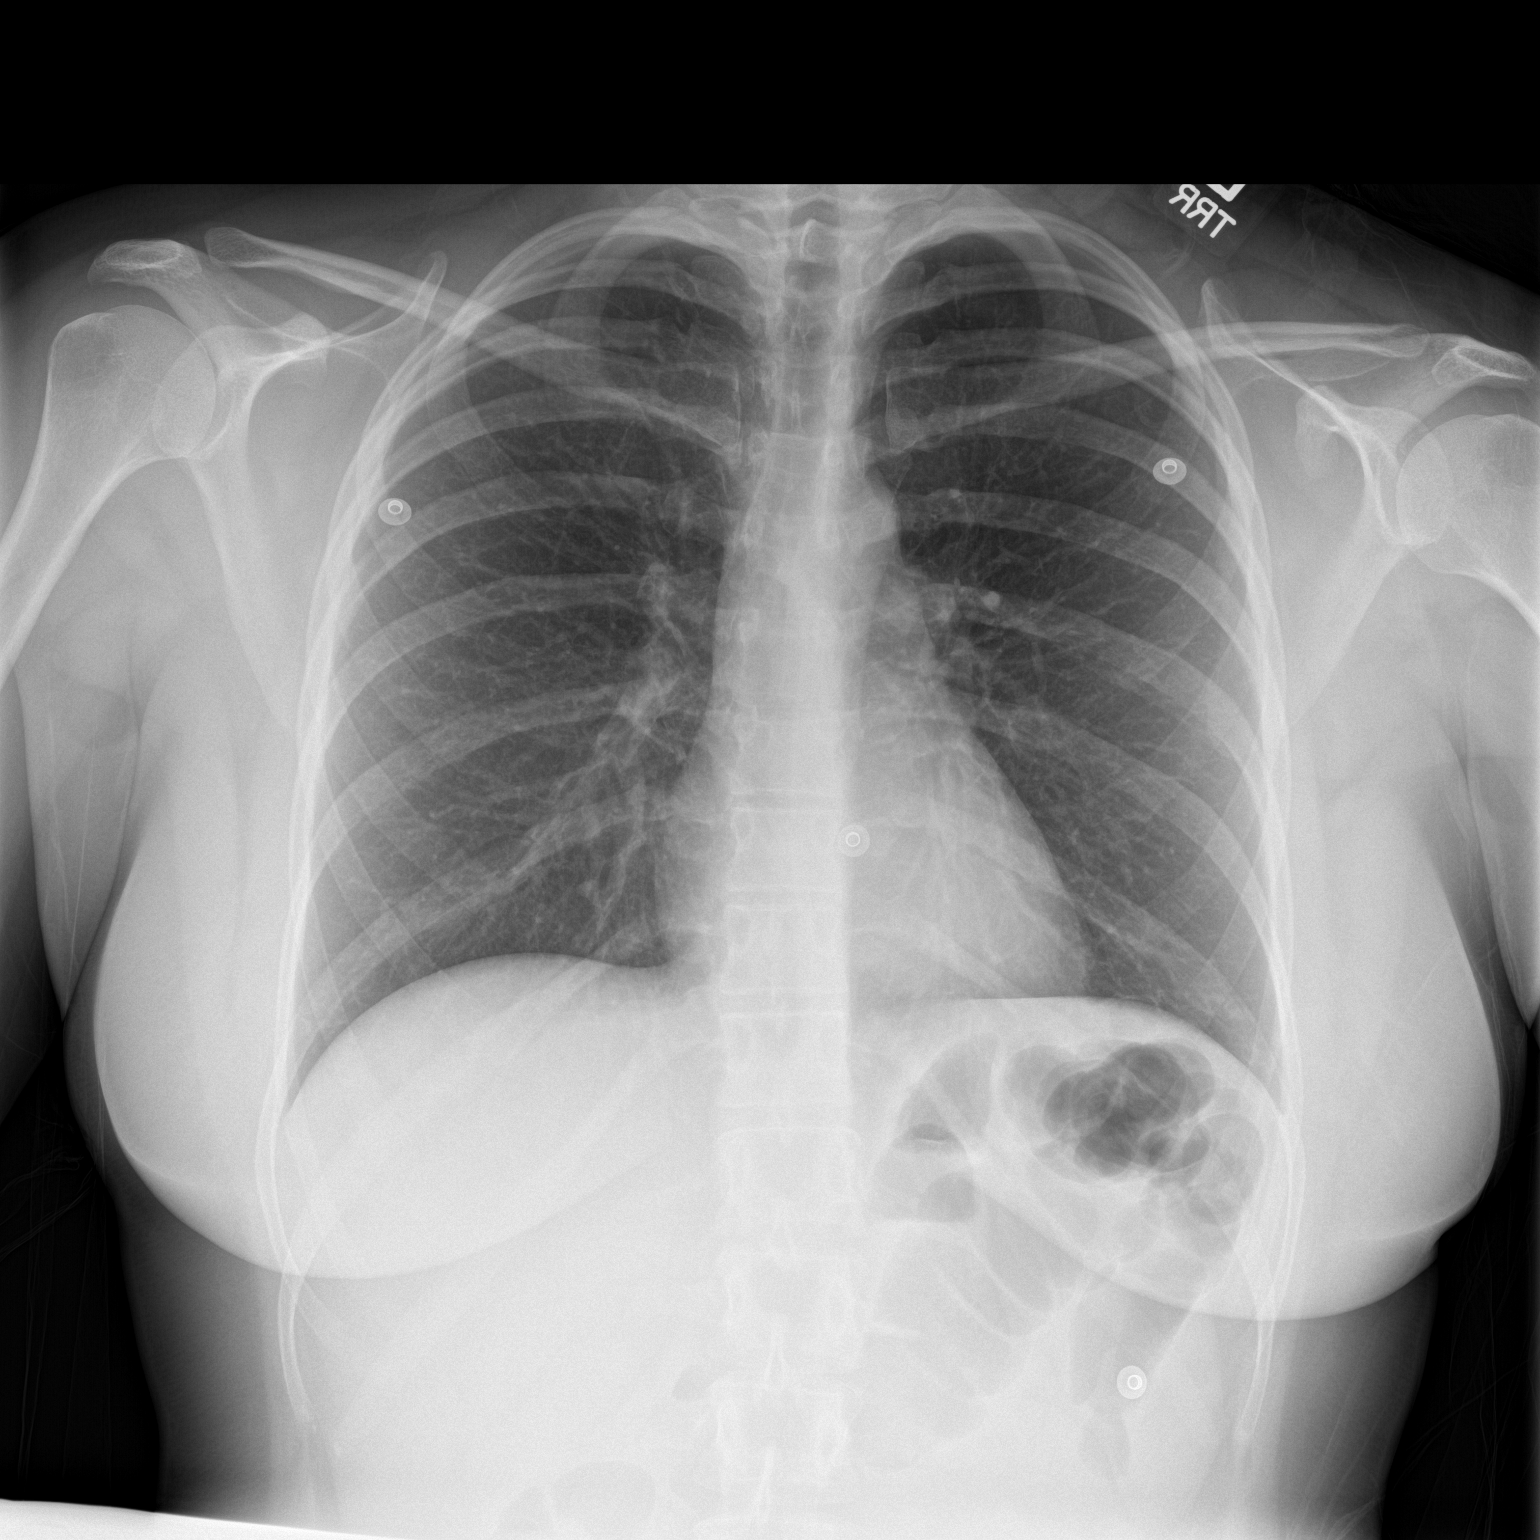
[im 2/2]
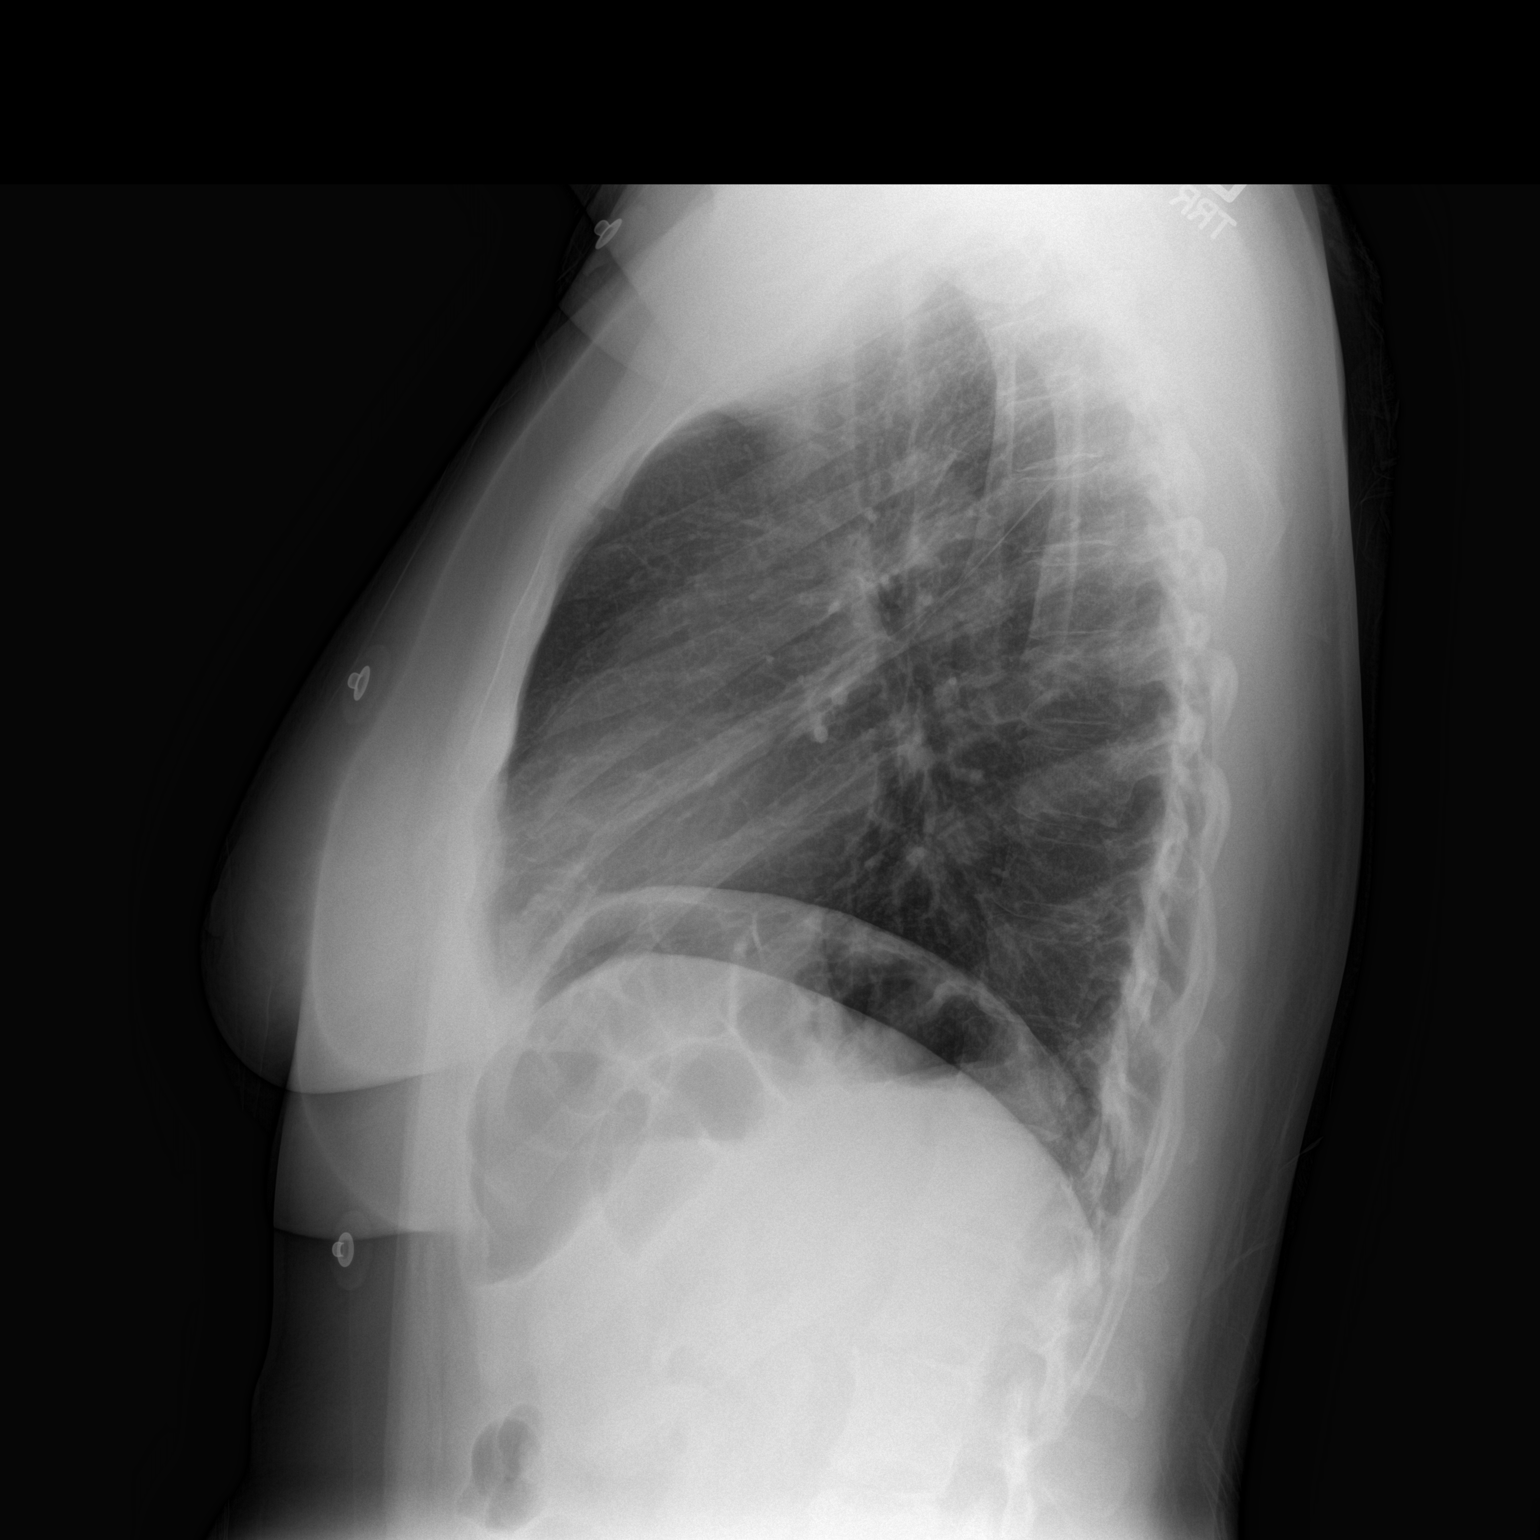

[2 of 2 positions shown; findings below may reference images not displayed]

FINDINGS: The heart size and mediastinal contours are within normal limits.
Both lungs are clear. The visualized skeletal structures are
unremarkable.
IMPRESSION: No active cardiopulmonary disease.

## 2016-07-11 ENCOUNTER — Ambulatory Visit (INDEPENDENT_AMBULATORY_CARE_PROVIDER_SITE_OTHER): Payer: Medicare Other | Admitting: Internal Medicine

## 2016-07-11 ENCOUNTER — Encounter: Payer: Self-pay | Admitting: Internal Medicine

## 2016-07-11 VITALS — BP 126/64 | HR 90 | Ht 63.0 in | Wt 159.0 lb

## 2016-07-11 DIAGNOSIS — G4700 Insomnia, unspecified: Secondary | ICD-10-CM

## 2016-07-11 MED ORDER — ZOLPIDEM TARTRATE ER 6.25 MG PO TBCR: 6.2500 mg | EXTENDED_RELEASE_TABLET | Freq: Every evening | ORAL | 1 refills | Status: DC | PRN

## 2016-07-11 NOTE — Patient Instructions (Addendum)
--  Start ambien CR 6.25 mg at bedtime.   --Avoid sleeping on your back.   --Download a sleep log on your smartphone and email us in 1 month with results of how much sleep your getting per night.   --Avoid napping during the day.   --Try to identify particular stresses and negative feelings/thoughts that may be disturbing your sleep.

## 2016-07-12 ENCOUNTER — Other Ambulatory Visit: Payer: Self-pay | Admitting: Neurology

## 2016-07-12 DIAGNOSIS — M545 Low back pain: Secondary | ICD-10-CM

## 2016-07-20 DIAGNOSIS — G43111 Migraine with aura, intractable, with status migrainosus: Secondary | ICD-10-CM | POA: Diagnosis not present

## 2016-07-20 DIAGNOSIS — M542 Cervicalgia: Secondary | ICD-10-CM | POA: Diagnosis not present

## 2016-07-20 DIAGNOSIS — G43109 Migraine with aura, not intractable, without status migrainosus: Secondary | ICD-10-CM | POA: Diagnosis not present

## 2016-07-20 DIAGNOSIS — M791 Myalgia: Secondary | ICD-10-CM | POA: Diagnosis not present

## 2016-07-20 DIAGNOSIS — G43719 Chronic migraine without aura, intractable, without status migrainosus: Secondary | ICD-10-CM | POA: Diagnosis not present

## 2016-07-20 DIAGNOSIS — R51 Headache: Secondary | ICD-10-CM | POA: Diagnosis not present

## 2016-07-20 DIAGNOSIS — G518 Other disorders of facial nerve: Secondary | ICD-10-CM | POA: Diagnosis not present

## 2016-07-25 ENCOUNTER — Ambulatory Visit
Admission: RE | Admit: 2016-07-25 | Discharge: 2016-07-25 | Disposition: A | Discharge status: Home / Self Care | Payer: Medicare Other | Source: Ambulatory Visit | Attending: Neurology | Admitting: Neurology

## 2016-07-25 DIAGNOSIS — M545 Low back pain: Secondary | ICD-10-CM | POA: Diagnosis not present

## 2016-07-25 DIAGNOSIS — G8929 Other chronic pain: Secondary | ICD-10-CM | POA: Diagnosis not present

## 2016-08-08 ENCOUNTER — Ambulatory Visit: Payer: Medicare Other | Admitting: Internal Medicine

## 2016-08-10 DIAGNOSIS — M791 Myalgia: Secondary | ICD-10-CM | POA: Diagnosis not present

## 2016-08-10 DIAGNOSIS — G518 Other disorders of facial nerve: Secondary | ICD-10-CM | POA: Diagnosis not present

## 2016-08-10 DIAGNOSIS — G43109 Migraine with aura, not intractable, without status migrainosus: Secondary | ICD-10-CM | POA: Diagnosis not present

## 2016-08-10 DIAGNOSIS — G43719 Chronic migraine without aura, intractable, without status migrainosus: Secondary | ICD-10-CM | POA: Diagnosis not present

## 2016-08-10 DIAGNOSIS — M542 Cervicalgia: Secondary | ICD-10-CM | POA: Diagnosis not present

## 2016-08-10 DIAGNOSIS — G43111 Migraine with aura, intractable, with status migrainosus: Secondary | ICD-10-CM | POA: Diagnosis not present

## 2016-08-10 DIAGNOSIS — R51 Headache: Secondary | ICD-10-CM | POA: Diagnosis not present

## 2016-08-25 ENCOUNTER — Encounter: Payer: Self-pay | Admitting: Internal Medicine

## 2016-08-25 ENCOUNTER — Telehealth: Payer: Self-pay | Admitting: *Deleted

## 2016-08-25 ENCOUNTER — Ambulatory Visit (INDEPENDENT_AMBULATORY_CARE_PROVIDER_SITE_OTHER): Payer: Medicare Other | Admitting: Internal Medicine

## 2016-08-25 VITALS — BP 126/74 | HR 68 | Ht 63.0 in | Wt 156.0 lb

## 2016-08-25 DIAGNOSIS — J4540 Moderate persistent asthma, uncomplicated: Secondary | ICD-10-CM | POA: Diagnosis not present

## 2016-08-25 MED ORDER — SUVOREXANT 10 MG PO TABS: 10.0000 mg | ORAL_TABLET | Freq: Every evening | ORAL | 1 refills | Status: DC | PRN

## 2016-08-25 NOTE — Telephone Encounter (Signed)
-----   Message from Stephanie AcreVishal Mungal, MD sent at 08/25/2016  1:50 PM EDT ----- Regarding: FW: insomnia - sleep med Ask [name] if she is willing try this  ----- Message ----- From: Shane CrutchPradeep Ramachandran, MD Sent: 08/25/2016  12:53 PM To: Stephanie AcreVishal Mungal, MD Subject: RE: insomnia - sleep med                       Can try Belsomra 10 mg 30 min before bedtime, may increase to 20 mg if no effect in 1 week.  ----- Message ----- From: Stephanie AcreVishal Mungal, MD Sent: 08/25/2016  10:16 AM To: Shane CrutchPradeep Ramachandran, MD Subject: insomnia - sleep med                           Deep, Patient was placed on a trial of Ambien 6.25 nightly, along with sleep hygiene techniques, however it is not working per the patient she has not used it in about in about a month. Is anything else you can't suggest for her insomnia?  Vishal

## 2016-08-25 NOTE — Progress Notes (Signed)
[facility] Pulmonary Medicine Consultation      MRN# [id number] Yesenia Wood [birth year]   CC: Chief Complaint  Patient presents with  . Follow-up    48mo rov.       Brief History: 10/07/2014 History: She is a pleasant 30 year old female accompanied by her mother and interpreter, seen in consultation for worsening asthma and shortness of breath, referred by Dr. Carlynn Wood.  History per the patient and review of medical records Patient is a past medical history of deafness, status post cochlear implant with failure, fibromyalgia, B12 deficiency, moderate asthma, obesity. Her asthma history is as follows: As a teenager exercise-induced bronchospasms that was treated with as needed albuterol, and her early 73s started having more shortness of breath and was previously on Qvar. Triggers: Unsure, mild exacerbation with seasonal changes, more shortness of breath with warm weather (this is very mild). Patient cannot identify any inciting factor 2-3 months ago that made her breathing more difficult. She had a spirometry done at her previous pulmonologist in [place], 07/11/2013, FEV1 79, FEV1/FVC 74. Her repeat spirometry at [facility] showed FEV1 48%, FEV1/FVC 66% on 09/02/2014. There has been no significantrespiratory illness in the past year the patient or her mother can identify a significant drop in her FEV1 and worsening of her symptoms. 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 She previously smoked one to 2 packs per day for 2 years and quit 8 years ago. She denies any E. Cigarettes or water-based tobacco products. Patient has a dog, Yorkie, that stay her,she states that she is very clean, does not have a lot of carpeting, no roaches or rodent infestations in her home. 2 months ago she was diagnosed with a urinary tract infection, shortly after that chest tightness and difficulty breathing, went to  the ER, had a negative d-dimer, will was told she had chest wall discomfort and was given a prednisone taper for shortness of breath. Upon completion of her prednisone taper she develop right-sided facial Bell's palsy. She has also had testing for Lyme, which was negative. On 09/16/2014 her primary care physician changed her controller medication to Advair 250/50, from Qvar, she is also on Ventolin, which she is currently using 3-4 times per day for chest tightness and shortness of breath: She states this does not provide significant relief. She denies fever, chills. 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 She could not recall any tick bites. Patient states that she has a peak flow meter at home and uses it every morning, currently she is averaging about 300 for the past one month, which is the yellow zone for her. Patient denies any cough, runny nose, mucus production.  PLAN: - CT Chest, IgE and IgG level, alpha1-antitrypsin, cardiac ECHO (bubble studies)   ROV 11/12/14 Patient presents today for a follow up visit of asthma (uncontrolled) Advair increased at last visit, no significant improvement.  At this point still with sob, and chest tightness, using albuterol 2-3 times a day for chest tightness and wheezing and "vibration" in chest.  Currently seeing ENT (Dr. 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 She would also like to discuss the results of her labs and imaging studies. Mother present at today's visit.  Plan - mild asthma exacerbation - Prednisone taper over 1 month,  advair, prn albuterol   ROV 03/04/15 Since her last visit she has had a 1 month trial of prednisone, which greatly improved her symptoms (chest tightness and sob). She stated that she complete steroid last week, and this week the symptoms are back, she is currently using albuterol 2-3 times  per day; previously on steroid maybe 1-2 times per day. Her family is planning to remove the rugs\carpet and get hypoallergenic pillows and sheets, due to dust mite allergy.  She has seen ENT (Dr. Andee Wood) and was told that her vocal cords were fine and was told that she had dust mite allergy. She had RAST testing done.  She has not had allergy testing since childhood.  Plan - allergy testing [facility] (Dr. Maple Wood), prednisone 5mg  daily, follow up with ENT  ROV 05/14/15 [name] presents today for a follow up visit of her asthma, which has been difficult to control. Her asthma she is currently on Advair 500/50, prednisone 5 mg daily, and as needed albuterol. She states that she's currently using albuterol 3-4 times a day for the past 2-3 weeks. At this time her triggers are heat and rapid weather changes. Since her last visit she has seen ENT, Dr. Andee Wood who stated that she had severe allergies to pollen, dust mites, grass/ragweed. She is currently receiving allergy shots 2 times per week. Also she has stopped pulmonary rehabilitation due to not providing any benefit. She was complaining of right sided stomach pain and flank pain,along with constipation to her primary care physician, which prompted an ultrasound of her abdomen; ultrasound yielded fatty liver. Today she is complaining of chest tightness, and some shortness of breath, but this is tolerable for her. Over the last 2-3 months she states that she's also complaining of chest pain/chest discomfort, she states that her chest feels tight and she becomes diaphoretic and very short winded. When this occurs she uses her albuterol, but it does not help much. This chest discomfort has increased over the last 3 months.  Per her visit with Dr. Maple Wood April 2016: Allergy skin testing 03/04/15- Strong Positive esp for trees and dust mites, some molds.  Plan - cont with asthma regiment, cardiology evaluation.   ROV 05/2015 Patient  presents for follow up visit of her asthma. Still with cough and shortness of breath at times. Currently getting allergy shots,twice weekly, this is not maintenance dose per the mother. Patient is accompanied by mother and sign language interpreter.  Still on prednisone and allergy shots. Patient had holter monitoring (48 hours) and treadmill stress test performed by Cardiology, results are pending.  Plan: -Continue with current allergy shots and current asthma regimen. -We'll start weaning Advair at next visit - Start prednisone wean as stated above  -Avoid asthma triggers as much as tolerable.  08/2015: Patient is well known to the pulmonary clinic, has moderate to severe uncontrolled asthma, requiring daily albuterol use. She's had a thorough workup for asthma including cardiac and pulmonary evaluation. Since her last visit she has seen cardiology, they have started her on Cardizem for high blood pressure and tachycardia.  She is accompanied today by her mother and hearing interpreter. Since her last visit she has weaned off prednisone 2.5 mg daily. She states that her breathing is slightly improved since being on Cardizem and becoming a little bit more active in terms of exercise. She still using albuterol daily, but is down from 5 times per day to 3 times per day. She does not endorse any wheezing but does  endorse shortness of breath. She also states that she is increase her level of activity, she tries to walk on a daily basis with her dog. She is getting allergy shots currently not at the maintenance level, but is being followed closely by ENT. -Her primary care physician has started her on valacyclovir for Bell's palsy. Plan: -Continue with current allergy shots and current asthma regimen. -We will start weaning Advair once her use of albuterol has decreased -Avoid asthma triggers as much as tolerable.  Events since last clinic visit: Patient resents today for follow-up visit  of her asthma. Since her last visit she has had a sleep study and sleep evaluation by a sleep specialist. Sleep specialist was diagnosed with insomnia and she was placed on a trial of  Ambien CR 6.25 mg daily at bedtime, which did not help She's also noted to have upper airway resistance syndrome that could develop into sleep apnea at some point in the future, for which she is following a sleep specialist. Currently, she is using albuterol rescue inhaler about 3 times per week.  She states that usually in the fall asthma starts acting up, she is currently not receiving any allergy shots since its been making her migraine headaches worse. She does have Allegra at home for which she can use daily for her allergies.     Medication:    Current Outpatient Prescriptions:  .  albuterol (PROVENTIL HFA;VENTOLIN HFA) 108 (90 BASE) MCG/ACT inhaler, Inhale 2 puffs into the lungs every 4 (four) hours as needed for wheezing or shortness of breath., Disp: 6.7 g, Rfl: 5 .  baclofen (LIORESAL) 10 MG tablet, Take 1 tablet by mouth once a week. migraine, Disp: , Rfl:  .  diltiazem (CARDIZEM CD) 240 MG 24 hr capsule, Take 1 capsule (240 mg total) by mouth daily., Disp: 30 capsule, Rfl: 5 .  Fluticasone-Salmeterol (ADVAIR DISKUS) 500-50 MCG/DOSE AEPB, Inhale 1 puff into the lungs 2 (two) times daily., Disp: 60 each, Rfl: 5 .  levETIRAcetam (KEPPRA) 250 MG tablet, 2 (two) times daily. , Disp: , Rfl:  .  levonorgestrel (MIRENA) 20 MCG/24HR IUD, 1 Intra Uterine Device (1 each total) by Intrauterine route once., Disp: 1 each, Rfl: 0 .  Multiple Vitamin (MULTI-VITAMINS) TABS, Take 1 tablet by mouth daily as needed., Disp: , Rfl:  .  triamcinolone cream (KENALOG) 0.1 %, Apply 1 application topically 2 (two) times daily., Disp: 80 g, Rfl: 0 .  zolpidem (AMBIEN CR) 6.25 MG CR tablet, Take 1 tablet (6.25 mg total) by mouth at bedtime as needed for sleep., Disp: 30 tablet, Rfl: 1    Review of Systems  Constitutional:  Negative for chills, diaphoresis, fever, malaise/fatigue and weight loss.  HENT: Negative for congestion, ear discharge, ear pain, nosebleeds, sore throat and tinnitus.   Eyes: Negative for blurred vision and double vision.  Respiratory: Positive for shortness of breath. Negative for cough, sputum production, wheezing and stridor.        Baseline shortness of breath especially with exertion   Cardiovascular: Negative for chest pain and leg swelling.  Gastrointestinal: Negative for heartburn, nausea and vomiting.  Genitourinary: Negative for dysuria.  Musculoskeletal: Negative for myalgias.  Skin: Negative for itching and rash.  Neurological: Negative for dizziness, weakness and headaches.  Endo/Heme/Allergies: Does not bruise/bleed easily.  Psychiatric/Behavioral: Negative for depression.      Allergies:  Symbicort [budesonide-formoterol fumarate]; Gabapentin; Nortriptyline; Diclofenac; Duloxetine hcl; Montelukast sodium; Mupirocin; Naproxen; and Sulfa antibiotics  Physical Examination:  VS: BP 126/74 (  BP Location: Left Arm, Cuff Size: Normal)   Pulse 68   Ht 5\' 3"  (1.6 m)   Wt 156 lb (70.8 kg)   SpO2 98%   BMI 27.63 kg/m   General Appearance: No distress  HEENT: PERRLA, no ptosis, no other lesions noticed.  Mallampati:1 Pulmonary:   Shallow breath sounds, poor respiratory effort with deep inspiration, mid chest tenderness over the sternum with mild to mod palpation Cardiovascular:  Normal S1,S2.  No m/r/g.     Abdomen:Exam: Benign, Soft, non-tender, No masses  Skin:   warm, no rashes, no ecchymosis  Extremities: normal, no cyanosis, clubbing, warm with normal capillary refill.    Assessment and Plan: 30 year old female seen in follow-up for moderate-severe persistent asthma and sleep evaluation.  Moderate persistent intrinsic asthma without status asthmaticus without complication Atopic as a small child. Role of environmental atopic triggers now is unclear. Total IgE not high,  and reported blood allergy test showed only dust mites allergy Allergy skin testing 03/04/15- Strong Positive esp for trees and dust mites, some molds.  Was receiving allergy shots at ENT office, but stopped recently due to making migraines worst.  Current regimen is: Advair 500/50, rescue inhaler IgE= 17, Abs eosinophil = 0.1 >>both normal Using albuterol 1-2 times in 2 weeks.  Avoidance of triggers is paramount to her quality of life, however this is difficult given that her triggers are mostly environmentally related. I have discussed starting Xolair with ENT, they currently see no contraindication, but allergy control at optimal regiment is needed first.   Following with cardiology for episodes of tachycardia/palpitations, currently wearing a Holter monitor for total of 2 weeks. She is also on diltiazem. At this time cannot switch her from albuterol to Xopenex due to allergy component in Xopenex. In any event, her albuterol usage is significantly decrease and is not clinically adding to tachycardia/palpitations.  Allergy shots on hold due to having fevers with migraine headaches.   Plan: -Continue with allergy regiment, continue following with cardiology Tachycardia/palpitations. -We will start weaning Advair once her use of albuterol has decreased -Avoid asthma triggers as much as tolerable. -Overall with stable asthma. - may use allegra or claritin during high allergy times.  - flu shot today            Updated Medication List Outpatient Encounter Prescriptions as of 08/25/2016  Medication Sig  . albuterol (PROVENTIL HFA;VENTOLIN HFA) 108 (90 BASE) MCG/ACT inhaler Inhale 2 puffs into the lungs every 4 (four) hours as needed for wheezing or shortness of breath.  . baclofen (LIORESAL) 10 MG tablet Take 1 tablet by mouth once a week. migraine  . diltiazem (CARDIZEM CD) 240 MG 24 hr capsule Take 1 capsule (240 mg total) by mouth daily.  . Fluticasone-Salmeterol (ADVAIR  DISKUS) 500-50 MCG/DOSE AEPB Inhale 1 puff into the lungs 2 (two) times daily.  Marland Kitchen levETIRAcetam (KEPPRA) 250 MG tablet 2 (two) times daily.   Marland Kitchen levonorgestrel (MIRENA) 20 MCG/24HR IUD 1 Intra Uterine Device (1 each total) by Intrauterine route once.  . Multiple Vitamin (MULTI-VITAMINS) TABS Take 1 tablet by mouth daily as needed.  . triamcinolone cream (KENALOG) 0.1 % Apply 1 application topically 2 (two) times daily.  Marland Kitchen zolpidem (AMBIEN CR) 6.25 MG CR tablet Take 1 tablet (6.25 mg total) by mouth at bedtime as needed for sleep.   No facility-administered encounter medications on file as of 08/25/2016.     Orders for this visit: No orders of the defined types were placed in this encounter.  Thank  you for the visitation and for allowing  [facility] to assist in the care of your patient. Our recommendations are noted above.  Please contact us if we can be of further service.  Stephanie Acre, MD [facility] Office Number: [phone]  Note: This note was prepared with Dragon dictation along with smaller phrase technology. Any transcriptional errors that result from this process are unintentional.

## 2016-08-25 NOTE — Patient Instructions (Signed)
Follow up with Dr. Dema SeverinMungal in:3 months - Flu shot today - cont with current inhalers - may use allerga or claritin daily if allergies get worst as the Fall season progresses.

## 2016-08-25 NOTE — Telephone Encounter (Signed)
Spoke with pt and informed her. Pt ask that we send it in to the [facility] in [place]. RX sent. Nothing further needed.

## 2016-08-25 NOTE — Assessment & Plan Note (Addendum)
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 Role of environmental atopic triggers now is unclear. Total IgE not high, and reported blood allergy test showed only dust mites allergy Allergy skin testing 03/04/15- Strong Positive esp for trees and dust mites, some molds.  Was receiving allergy shots at ENT office, but stopped recently due to making migraines worst.  Current regimen is: Advair 500/50, rescue inhaler IgE= 17, Abs eosinophil = 0.1 >>both normal Using albuterol 1-2 times in 2 weeks.  Avoidance of triggers is paramount to her quality of life, however this is difficult given that her triggers are mostly environmentally related. I have discussed starting Xolair with ENT, they currently see no contraindication, but allergy control at optimal regiment is needed first.   Following with cardiology for episodes of tachycardia/palpitations, currently wearing a Holter monitor for total of 2 weeks. She is also on diltiazem. At this time cannot switch her from albuterol to Xopenex due to allergy component in Xopenex. In any event, her albuterol usage is significantly decrease and is not clinically adding to tachycardia/palpitations.  Allergy shots on hold due to having fevers with migraine headaches.   Plan: -Continue with allergy regiment, continue following with cardiology Tachycardia/palpitations. -We will start weaning Advair once her use of albuterol has decreased -Avoid asthma triggers as much as tolerable. -Overall with stable asthma. - may use allegra or claritin during high allergy times.  - flu shot today

## 2016-08-26 ENCOUNTER — Telehealth: Payer: Self-pay | Admitting: Internal Medicine

## 2016-08-26 NOTE — Telephone Encounter (Signed)
Pharmacy calling about Belsomra  Not covered by insurance would like another prescription sent in in place of this Please advise.

## 2016-08-26 NOTE — Telephone Encounter (Signed)
VM please advise if we can send in something different since Belsomra is not covered. Thanks.

## 2016-08-29 NOTE — Telephone Encounter (Signed)
We would have to ask Dr. Ardyth Manam.

## 2016-08-29 NOTE — Telephone Encounter (Signed)
Will forward to DR for further advise per VM.  Dr. Ardyth Manam, what would be your next suggestion to help the pt with insomnia. Dr. Dema SeverinMungal had me to send in Belsomra but it isn't covered by pt's insurance, Thanks.

## 2016-08-31 NOTE — Telephone Encounter (Signed)
That is fine with me.  Thanks.

## 2016-08-31 NOTE — Telephone Encounter (Signed)
DR states to increase ambien to [facility] [facility] cr 12.5mg  nightly. I will place RX if ok with you.

## 2016-08-31 NOTE — Telephone Encounter (Signed)
Increase ambien to [employer]ien CR 12.5 mg nightly.

## 2016-09-01 NOTE — Telephone Encounter (Signed)
LMOVM for pt to return call 

## 2016-09-02 NOTE — Telephone Encounter (Signed)
LMOVM for pt to return call 

## 2016-09-05 ENCOUNTER — Encounter: Payer: Self-pay | Admitting: *Deleted

## 2016-09-05 NOTE — Telephone Encounter (Signed)
Tried to call pt but unable to contact. Will mail letter and close encounter.

## 2016-09-12 ENCOUNTER — Other Ambulatory Visit: Payer: Self-pay | Admitting: Otolaryngology

## 2016-09-12 DIAGNOSIS — J301 Allergic rhinitis due to pollen: Secondary | ICD-10-CM | POA: Diagnosis not present

## 2016-09-12 DIAGNOSIS — H9201 Otalgia, right ear: Secondary | ICD-10-CM | POA: Diagnosis not present

## 2016-09-12 DIAGNOSIS — G8929 Other chronic pain: Secondary | ICD-10-CM

## 2016-09-19 ENCOUNTER — Encounter: Payer: Self-pay | Admitting: Family Medicine

## 2016-09-21 ENCOUNTER — Ambulatory Visit
Admission: RE | Admit: 2016-09-21 | Discharge: 2016-09-21 | Disposition: A | Discharge status: Home / Self Care | Payer: Medicare Other | Source: Ambulatory Visit | Attending: Otolaryngology | Admitting: Otolaryngology

## 2016-09-21 DIAGNOSIS — H9201 Otalgia, right ear: Secondary | ICD-10-CM | POA: Diagnosis not present

## 2016-09-21 DIAGNOSIS — G8929 Other chronic pain: Secondary | ICD-10-CM | POA: Diagnosis not present

## 2016-09-26 ENCOUNTER — Other Ambulatory Visit: Payer: Self-pay | Admitting: Cardiovascular Disease

## 2016-09-29 DIAGNOSIS — J301 Allergic rhinitis due to pollen: Secondary | ICD-10-CM | POA: Diagnosis not present

## 2016-09-29 DIAGNOSIS — H903 Sensorineural hearing loss, bilateral: Secondary | ICD-10-CM | POA: Diagnosis not present

## 2016-09-29 DIAGNOSIS — H9201 Otalgia, right ear: Secondary | ICD-10-CM | POA: Diagnosis not present

## 2016-10-10 ENCOUNTER — Encounter: Payer: Self-pay | Admitting: Family Medicine

## 2016-10-10 ENCOUNTER — Ambulatory Visit (INDEPENDENT_AMBULATORY_CARE_PROVIDER_SITE_OTHER): Payer: Medicare Other | Admitting: Family Medicine

## 2016-10-10 VITALS — BP 112/72 | HR 89 | Temp 97.0°F | Resp 16 | Ht 63.0 in | Wt 153.5 lb

## 2016-10-10 DIAGNOSIS — J4540 Moderate persistent asthma, uncomplicated: Secondary | ICD-10-CM

## 2016-10-10 DIAGNOSIS — R739 Hyperglycemia, unspecified: Secondary | ICD-10-CM | POA: Diagnosis not present

## 2016-10-10 DIAGNOSIS — F5105 Insomnia due to other mental disorder: Secondary | ICD-10-CM | POA: Insufficient documentation

## 2016-10-10 DIAGNOSIS — K219 Gastro-esophageal reflux disease without esophagitis: Secondary | ICD-10-CM | POA: Diagnosis not present

## 2016-10-10 DIAGNOSIS — G43709 Chronic migraine without aura, not intractable, without status migrainosus: Secondary | ICD-10-CM | POA: Diagnosis not present

## 2016-10-10 DIAGNOSIS — E781 Pure hyperglyceridemia: Secondary | ICD-10-CM | POA: Diagnosis not present

## 2016-10-10 DIAGNOSIS — F602 Antisocial personality disorder: Secondary | ICD-10-CM | POA: Diagnosis not present

## 2016-10-10 DIAGNOSIS — E538 Deficiency of other specified B group vitamins: Secondary | ICD-10-CM

## 2016-10-10 DIAGNOSIS — G4733 Obstructive sleep apnea (adult) (pediatric): Secondary | ICD-10-CM | POA: Diagnosis not present

## 2016-10-10 DIAGNOSIS — G4709 Other insomnia: Secondary | ICD-10-CM

## 2016-10-10 DIAGNOSIS — Z79899 Other long term (current) drug therapy: Secondary | ICD-10-CM | POA: Diagnosis not present

## 2016-10-10 DIAGNOSIS — M797 Fibromyalgia: Secondary | ICD-10-CM | POA: Diagnosis not present

## 2016-10-10 LAB — COMPLETE METABOLIC PANEL WITH GFR
ALBUMIN: 4.6 g/dL (ref 3.6–5.1)
ALK PHOS: 85 U/L (ref 33–115)
ALT: 14 U/L (ref 6–29)
AST: 15 U/L (ref 10–30)
BILIRUBIN TOTAL: 0.4 mg/dL (ref 0.2–1.2)
BUN: 5 mg/dL — ABNORMAL LOW (ref 7–25)
CALCIUM: 9.5 mg/dL (ref 8.6–10.2)
CO2: 29 mmol/L (ref 20–31)
Chloride: 104 mmol/L (ref 98–110)
Creat: 0.72 mg/dL (ref 0.50–1.10)
GLUCOSE: 82 mg/dL (ref 65–99)
POTASSIUM: 4.5 mmol/L (ref 3.5–5.3)
Sodium: 139 mmol/L (ref 135–146)
TOTAL PROTEIN: 7.1 g/dL (ref 6.1–8.1)

## 2016-10-10 LAB — CBC WITH DIFFERENTIAL/PLATELET
BASOS ABS: 72 {cells}/uL (ref 0–200)
Basophils Relative: 1 %
EOS PCT: 1 %
Eosinophils Absolute: 72 cells/uL (ref 15–500)
HCT: 45.4 % — ABNORMAL HIGH (ref 35.0–45.0)
Hemoglobin: 14.7 g/dL (ref 11.7–15.5)
Lymphocytes Relative: 31 %
Lymphs Abs: 2232 cells/uL (ref 850–3900)
MCH: 29.1 pg (ref 27.0–33.0)
MCHC: 32.4 g/dL (ref 32.0–36.0)
MCV: 89.7 fL (ref 80.0–100.0)
MONOS PCT: 9 %
MPV: 10.1 fL (ref 7.5–12.5)
Monocytes Absolute: 648 cells/uL (ref 200–950)
NEUTROS ABS: 4176 {cells}/uL (ref 1500–7800)
Neutrophils Relative %: 58 %
PLATELETS: 344 10*3/uL (ref 140–400)
RBC: 5.06 MIL/uL (ref 3.80–5.10)
RDW: 13.2 % (ref 11.0–15.0)
WBC: 7.2 10*3/uL (ref 3.8–10.8)

## 2016-10-10 LAB — LIPID PANEL
CHOL/HDL RATIO: 4.9 ratio (ref <5.0)
Cholesterol: 192 mg/dL (ref <200)
HDL: 39 mg/dL — AB (ref >50)
LDL CALC: 133 mg/dL — AB (ref <100)
TRIGLYCERIDES: 101 mg/dL (ref <150)
VLDL: 20 mg/dL (ref <30)

## 2016-10-10 LAB — VITAMIN B12: VITAMIN B 12: 575 pg/mL (ref 200–1100)

## 2016-10-10 NOTE — Progress Notes (Signed)
Name: Yesenia Wood   MRN: [id number]    DOB: 07/08/1986   Date:10/10/2016       Progress Note  Subjective  Chief Complaint  Chief Complaint  Patient presents with  . 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 Currently not having the change in personality. She has "Yesenia Wood" principal personality. Personality 2 is Yesenia Wood : cruel, risk taker, swearing/cursing, suicidal. 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  Mother states that she has not had those symptoms in a long time. She is doing well at this time  Disruptive sleep: she used to take  naps during the day, interrupted sleep at night. Dr. Neale BurlyFreeman told her not to nap - but still has problems sleeping at night. Dr Neale BurlyFreeman has ordered a sleep study, that showed mild OSA and upper respiratory resistance syndrome.   Numbness: she had 2 NCS/EMG, MRI of brain and c-spine and is all normal, explained that may be secondary to FMS-  She is off B12 for the past 2 months as recommended by Dr. Neale BurlyFreeman, symptoms still present, symptoms are better, no more weakness/dropping things, just tingling   Migraine headaches: she is off pain medications, seeing Dr. Neale BurlyFreeman and had trigger point injections, and improved symptoms but since she got the flu shot in 07/2016 symptoms have been daily since. Pain is described as frontal, described as a sensitivity, pressure at times, she has photophobia associated with symptoms. No vomiting.   Asthma: seeing Dr. Dema SeverinMungal and has been doing well on current regiment. Only has symptoms during activity, described as SOB and occasionally wheezing  Insomnia: she had abnormal sleep onset and latency during sleep study, she has tried multiple medication without help.   FMS: she still has pain, and mental fogginess, but does not seem to be as intense, about 3/10 on a good day  Patient Active Problem List   Diagnosis Date Noted  . OSA (obstructive  sleep apnea) 10/10/2016  . Other insomnia 10/10/2016  . Dissocial personality disorder 06/07/2016  . Sleep disturbance 05/10/2016  . Chronic migraine without aura 03/29/2016  . Costochondral chest pain 12/28/2015  . Pain in the chest 06/09/2015  . Palpitations 06/09/2015  . Hearing loss 05/16/2015  . B12 deficiency 05/16/2015  . Cochlear implant status 05/16/2015  . Fatty infiltration of liver 05/16/2015  . Fibromyalgia syndrome 05/16/2015  . Gastro-esophageal reflux disease without esophagitis 05/16/2015  . Generalized headache 05/16/2015  . Right-sided Bell's palsy 05/16/2015  . H/O: depression 05/16/2015  . Hypertriglyceridemia 05/16/2015  . Blood glucose elevated 05/16/2015  . Irregular menstrual cycle 05/16/2015  . Overweight 05/16/2015  . Paresthesia 05/16/2015  . Disorder of labyrinth 05/16/2015  . Dyspnea 10/07/2014  . Moderate persistent intrinsic asthma without status asthmaticus without complication 10/07/2014    Past Surgical History:  Procedure Laterality Date  . BARTHOLIN GLAND CYST REMOVAL    . COCHLEAR IMPLANT    . COCHLEAR IMPLANT REMOVAL    . LAPAROSCOPY    . MYRINGOTOMY WITH TUBE PLACEMENT      Family History  Problem Relation Age of Onset  . Adopted: Yes  . Family history unknown: Yes    Social History   Social History  . Marital status: Single    Spouse name: N/A  . Number of children: N/A  . Years of education: N/A   Occupational History  . Not on file.   Social History Main Topics  . Smoking status: Former Smoker  Packs/day: 1.00    Years: 2.00    Types: Cigarettes    Quit date: 11/28/2006  . Smokeless tobacco: Never Used  . Alcohol use No     Comment: rarely  . Drug use: No  . Sexual activity: Yes    Birth control/ protection: Pill   Other Topics Concern  . Not on file   Social History Narrative  . No narrative on file     Current Outpatient Prescriptions:  .  albuterol (PROVENTIL HFA;VENTOLIN HFA) 108 (90 BASE)  MCG/ACT inhaler, Inhale 2 puffs into the lungs every 4 (four) hours as needed for wheezing or shortness of breath., Disp: 6.7 g, Rfl: 5 .  baclofen (LIORESAL) 10 MG tablet, Take 1 tablet by mouth once a week. migraine, Disp: , Rfl:  .  diltiazem (CARDIZEM CD) 240 MG 24 hr capsule, TAKE 1 CAPSULE (240 MG TOTAL) BY MOUTH DAILY., Disp: 30 capsule, Rfl: 3 .  Fluticasone-Salmeterol (ADVAIR DISKUS) 500-50 MCG/DOSE AEPB, Inhale 1 puff into the lungs 2 (two) times daily., Disp: 60 each, Rfl: 5 .  levETIRAcetam (KEPPRA) 250 MG tablet, 2 (two) times daily., Disp: , Rfl:  .  levonorgestrel (MIRENA) 20 MCG/24HR IUD, 1 Intra Uterine Device (1 each total) by Intrauterine route once., Disp: 1 each, Rfl: 0 .  triamcinolone cream (KENALOG) 0.1 %, Apply 1 application topically 2 (two) times daily., Disp: 80 g, Rfl: 0 .  Multiple Vitamin (MULTI-VITAMINS) TABS, Take 1 tablet by mouth daily as needed., Disp: , Rfl:   Allergies  Allergen Reactions  . Symbicort [Budesonide-Formoterol Fumarate] Anaphylaxis and Other (See Comments)    Chest pain  . Gabapentin Nausea And Vomiting  . Nortriptyline     Other reaction(s): Other (See Comments)  . Diclofenac Other (See Comments)  . Duloxetine Hcl Other (See Comments)  . Montelukast Sodium Other (See Comments)  . Mupirocin Other (See Comments)  . Naproxen Other (See Comments)  . Sulfa Antibiotics Hives     ROS  Constitutional: Negative for fever, positive for mild weight change.  Respiratory: positive  for mild cough and shortness of breath.   Cardiovascular: Negative for chest pain or palpitations.  Gastrointestinal: Negative for abdominal pain, no bowel changes.  Musculoskeletal: Negative for gait problem or joint swelling.  Skin: Negative for rash.  Neurological: Negative for dizziness, positive for  headache.  No other specific complaints in a complete review of systems (except as listed in HPI above).  Objective  Vitals:   10/10/16 1113  BP: 112/72   Pulse: 89  Resp: 16  Temp: 97 F (36.1 C)  TempSrc: Oral  SpO2: 98%  Weight: 153 lb 8 oz (69.6 kg)  Height: 5\' 3"  (1.6 m)    Body mass index is 27.19 kg/m.  Physical Exam  Constitutional: Patient appears well-developed and well-nourished. Obese  No distress.  HEENT: head atraumatic, normocephalic, pupils equal and reactive to light, neck supple, throat within normal limits Cardiovascular: Normal rate, regular rhythm and normal heart sounds.  No murmur heard. No BLE edema. Pulmonary/Chest: Effort normal and breath sounds normal. No respiratory distress. Abdominal: Soft.  There is no tenderness. Psychiatric: Patient has a normal mood and affect. behavior is normal. Judgment and thought content normal.  PHQ2/9: Depression screen [facility] 2/9 06/07/2016 03/16/2016 01/28/2016 10/01/2015 07/20/2015  Decreased Interest 0 0 0 0 0  Down, Depressed, Hopeless 0 0 0 0 1  PHQ - 2 Score 0 0 0 0 1     Fall Risk: Fall Risk  06/07/2016 03/16/2016  01/28/2016 10/01/2015 07/20/2015  Falls in the past year? No Yes Yes Yes Yes  Number falls in past yr: - 1 1 2  or more 1  Injury with Fall? - Yes No No No  Risk Factor Category  - - - - -     Functional Status Survey: Is the patient deaf or have difficulty hearing?: No (speaking uses sign language) Does the patient have difficulty seeing, even when wearing glasses/contacts?: No Does the patient have difficulty concentrating, remembering, or making decisions?: No Does the patient have difficulty walking or climbing stairs?: No Does the patient have difficulty doing errands alone such as visiting a doctor's office or shopping?: No    Assessment & Plan  1. Chronic migraine without aura without status migrainosus, not intractable  Still has symptoms, taking medication, needs to follow up with Dr. Neale BurlyFreeman   2. B12 deficiency  - Vitamin B12  3. Hypertriglyceridemia  - Lipid panel  4. Moderate persistent intrinsic asthma without status asthmaticus  without complication  Continue follow up with Dr. Garald BraverMongul   5. Dissocial personality disorder  Doing well at this time  6. Gastro-esophageal reflux disease without esophagitis  stable  7. Fibromyalgia syndrome  Stable  8. Other insomnia  She does not want to try any other medications  9. OSA (obstructive sleep apnea)  She is not on CPAP, advised by Dr. Neale BurlyFreeman to raise the head of the bed  10. Hyperglycemia  - Hemoglobin A1c  11. Long-term use of high-risk medication  - COMPLETE METABOLIC PANEL WITH GFR - CBC with Differential/Platelet

## 2016-10-11 ENCOUNTER — Encounter: Payer: Self-pay | Admitting: Family Medicine

## 2016-10-11 LAB — HEMOGLOBIN A1C
Hgb A1c MFr Bld: 5 % (ref <5.7)
MEAN PLASMA GLUCOSE: 97 mg/dL

## 2016-11-01 DIAGNOSIS — M542 Cervicalgia: Secondary | ICD-10-CM | POA: Diagnosis not present

## 2016-11-01 DIAGNOSIS — R51 Headache: Secondary | ICD-10-CM | POA: Diagnosis not present

## 2016-11-01 DIAGNOSIS — M791 Myalgia: Secondary | ICD-10-CM | POA: Diagnosis not present

## 2016-11-01 DIAGNOSIS — G518 Other disorders of facial nerve: Secondary | ICD-10-CM | POA: Diagnosis not present

## 2016-11-01 DIAGNOSIS — G43111 Migraine with aura, intractable, with status migrainosus: Secondary | ICD-10-CM | POA: Diagnosis not present

## 2016-11-01 DIAGNOSIS — G43109 Migraine with aura, not intractable, without status migrainosus: Secondary | ICD-10-CM | POA: Diagnosis not present

## 2016-11-01 DIAGNOSIS — G43719 Chronic migraine without aura, intractable, without status migrainosus: Secondary | ICD-10-CM | POA: Diagnosis not present

## 2016-11-24 ENCOUNTER — Emergency Department: Payer: Medicare Other

## 2016-11-24 ENCOUNTER — Encounter: Payer: Self-pay | Admitting: Emergency Medicine

## 2016-11-24 ENCOUNTER — Emergency Department
Admission: EM | Admit: 2016-11-24 | Discharge: 2016-11-24 | Disposition: A | Discharge status: Home / Self Care | Payer: Medicare Other | Attending: Emergency Medicine | Admitting: Emergency Medicine

## 2016-11-24 DIAGNOSIS — Z79899 Other long term (current) drug therapy: Secondary | ICD-10-CM | POA: Diagnosis not present

## 2016-11-24 DIAGNOSIS — I10 Essential (primary) hypertension: Secondary | ICD-10-CM | POA: Diagnosis not present

## 2016-11-24 DIAGNOSIS — J4540 Moderate persistent asthma, uncomplicated: Secondary | ICD-10-CM | POA: Diagnosis not present

## 2016-11-24 DIAGNOSIS — Z87891 Personal history of nicotine dependence: Secondary | ICD-10-CM | POA: Insufficient documentation

## 2016-11-24 DIAGNOSIS — R1031 Right lower quadrant pain: Secondary | ICD-10-CM | POA: Diagnosis not present

## 2016-11-24 DIAGNOSIS — R110 Nausea: Secondary | ICD-10-CM | POA: Insufficient documentation

## 2016-11-24 HISTORY — DX: Antisocial personality disorder: F60.2

## 2016-11-24 HISTORY — DX: Essential (primary) hypertension: I10

## 2016-11-24 LAB — URINALYSIS, COMPLETE (UACMP) WITH MICROSCOPIC
Bacteria, UA: NONE SEEN
Bilirubin Urine: NEGATIVE
GLUCOSE, UA: NEGATIVE mg/dL
HGB URINE DIPSTICK: NEGATIVE
Ketones, ur: NEGATIVE mg/dL
LEUKOCYTES UA: NEGATIVE
NITRITE: NEGATIVE
PH: 7 (ref 5.0–8.0)
Protein, ur: NEGATIVE mg/dL
SPECIFIC GRAVITY, URINE: 1.015 (ref 1.005–1.030)

## 2016-11-24 LAB — COMPREHENSIVE METABOLIC PANEL
ALT: 15 U/L (ref 14–54)
ANION GAP: 6 (ref 5–15)
AST: 17 U/L (ref 15–41)
Albumin: 4.4 g/dL (ref 3.5–5.0)
Alkaline Phosphatase: 93 U/L (ref 38–126)
BUN: 9 mg/dL (ref 6–20)
CHLORIDE: 106 mmol/L (ref 101–111)
CO2: 27 mmol/L (ref 22–32)
CREATININE: 0.92 mg/dL (ref 0.44–1.00)
Calcium: 8.8 mg/dL — ABNORMAL LOW (ref 8.9–10.3)
Glucose, Bld: 96 mg/dL (ref 65–99)
POTASSIUM: 3.6 mmol/L (ref 3.5–5.1)
Sodium: 139 mmol/L (ref 135–145)
Total Bilirubin: 0.6 mg/dL (ref 0.3–1.2)
Total Protein: 7.5 g/dL (ref 6.5–8.1)

## 2016-11-24 LAB — CBC
HCT: 41.9 % (ref 35.0–47.0)
Hemoglobin: 14.1 g/dL (ref 12.0–16.0)
MCH: 28.8 pg (ref 26.0–34.0)
MCHC: 33.6 g/dL (ref 32.0–36.0)
MCV: 85.7 fL (ref 80.0–100.0)
Platelets: 318 10*3/uL (ref 150–440)
RBC: 4.89 MIL/uL (ref 3.80–5.20)
RDW: 13.5 % (ref 11.5–14.5)
WBC: 9.6 10*3/uL (ref 3.6–11.0)

## 2016-11-24 LAB — LIPASE, BLOOD: LIPASE: 18 U/L (ref 11–51)

## 2016-11-24 MED ORDER — IOPAMIDOL (ISOVUE-300) INJECTION 61%
30.0000 mL | Freq: Once | INTRAVENOUS | Status: AC | PRN
  Administered 2016-11-24: 30 mL via ORAL
  Filled 2016-11-24: qty 30

## 2016-11-24 MED ORDER — ONDANSETRON HCL 4 MG/2ML IJ SOLN
INTRAMUSCULAR | Status: AC
  Administered 2016-11-24: 4 mg via INTRAVENOUS
  Filled 2016-11-24: qty 2

## 2016-11-24 MED ORDER — IOPAMIDOL (ISOVUE-300) INJECTION 61%
100.0000 mL | Freq: Once | INTRAVENOUS | Status: AC | PRN
  Administered 2016-11-24: 100 mL via INTRAVENOUS
  Filled 2016-11-24: qty 100

## 2016-11-24 MED ORDER — ONDANSETRON HCL 4 MG/2ML IJ SOLN
4.0000 mg | INTRAMUSCULAR | Status: AC
  Administered 2016-11-24: 4 mg via INTRAVENOUS

## 2016-11-24 MED ORDER — ONDANSETRON 4 MG PO TBDP: ORAL_TABLET | ORAL | 0 refills | Status: DC

## 2016-11-24 MED ORDER — HYDROCODONE-ACETAMINOPHEN 5-325 MG PO TABS: 1.0000 | ORAL_TABLET | ORAL | 0 refills | Status: DC | PRN

## 2016-11-24 MED ORDER — DOCUSATE SODIUM 100 MG PO CAPS: ORAL_CAPSULE | ORAL | 0 refills | Status: DC

## 2016-11-24 NOTE — ED Notes (Signed)
CT informed that patient finished with oral contrast.

## 2016-11-24 NOTE — ED Provider Notes (Signed)
[facility] Emergency Department Provider Note  ____________________________________________   First MD Initiated Contact with Patient 11/24/16 1553     (approximate)  I have reviewed the triage vital signs and the nursing notes.   HISTORY  Chief Complaint Abdominal Pain  The patient is deaf.  We communicated to her by writing on a note pad back and forth, using the tele-interpreter, and completed with an in-person ASL interpreter  HPI Yesenia Wood is a 30 y.o. female who is deaf and has a medical history that includes paresthesias and fibromyalgia who presents for evaluation of acute onset right lower quadrant abdominal pain with nausea.  The pain startedthis morning and has stayed about the same, moderate in severity.  She has had nausea but no vomiting.  Her stools are soft but not loose or watery.  Movement makes the pain worse and nothing in particular makes it better.  Last LMP was over a year ago because she has an IUD.   Past Medical History:  Diagnosis Date  . B12 deficiency   . Bell's palsy    right sided  . Deaf   . 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 Fatigue   . Fatty liver   . Fibromyalgia   . GERD (gastroesophageal reflux disease)   . Headache   . High triglycerides   . Hyperglycemia   . Hypertension   . Moderate asthma   . Obesity   . Paresthesia   . Premature birth     Patient Active Problem List   Diagnosis Date Noted  . OSA (obstructive sleep apnea) 10/10/2016  . Other insomnia 10/10/2016  . Dissocial personality disorder 06/07/2016  . Sleep disturbance 05/10/2016  . Chronic migraine without aura 03/29/2016  . Costochondral chest pain 12/28/2015  . Pain in the chest 06/09/2015  . Palpitations 06/09/2015  . Hearing loss 05/16/2015  . B12 deficiency 05/16/2015  . Cochlear implant status 05/16/2015  . Fatty infiltration of liver 05/16/2015  . Fibromyalgia syndrome  05/16/2015  . Gastro-esophageal reflux disease without esophagitis 05/16/2015  . Generalized headache 05/16/2015  . Right-sided Bell's palsy 05/16/2015  . H/O: depression 05/16/2015  . Hypertriglyceridemia 05/16/2015  . Blood glucose elevated 05/16/2015  . Irregular menstrual cycle 05/16/2015  . Overweight 05/16/2015  . Paresthesia 05/16/2015  . Disorder of labyrinth 05/16/2015  . Dyspnea 10/07/2014  . Moderate persistent intrinsic asthma without status asthmaticus without complication 10/07/2014    Past Surgical History:  Procedure Laterality Date  . BARTHOLIN GLAND CYST REMOVAL    . COCHLEAR IMPLANT    . COCHLEAR IMPLANT REMOVAL    . LAPAROSCOPY    . MYRINGOTOMY WITH TUBE PLACEMENT      Prior to Admission medications   Medication Sig Start Date End Date Taking? Authorizing Provider  albuterol (PROVENTIL HFA;VENTOLIN HFA) 108 (90 BASE) MCG/ACT inhaler Inhale 2 puffs into the lungs every 4 (four) hours as needed for wheezing or shortness of breath. 05/14/15   Vishal Mungal, MD  baclofen (LIORESAL) 10 MG tablet Take 1 tablet by mouth once a week. migraine 05/17/16   Santiago Glad, MD  diltiazem (CARDIZEM CD) 240 MG 24 hr capsule TAKE 1 CAPSULE (240 MG TOTAL) BY MOUTH DAILY. 09/26/16   Iran Ouch, MD  docusate sodium (COLACE) 100 MG capsule Take 1 tablet once or twice daily as needed for constipation while taking narcotic pain medicine 11/24/16   Loleta Rose, MD  Fluticasone-Salmeterol (ADVAIR DISKUS) 500-50 MCG/DOSE  AEPB Inhale 1 puff into the lungs 2 (two) times daily. 07/05/16 07/05/17  Stephanie AcreVishal Mungal, MD  HYDROcodone-acetaminophen (NORCO/VICODIN) 5-325 MG tablet Take 1-2 tablets by mouth every 4 (four) hours as needed for moderate pain. 11/24/16   Loleta Roseory Kashvi Prevette, MD  levETIRAcetam (KEPPRA) 250 MG tablet 2 (two) times daily. 05/05/16   Santiago GladMarshall Freeman, MD  levonorgestrel (MIRENA) 20 MCG/24HR IUD 1 Intra Uterine Device (1 each total) by Intrauterine route once. 06/10/15   Melody N  Shambley, CNM  Multiple Vitamin (MULTI-VITAMINS) TABS Take 1 tablet by mouth daily as needed.    Historical Provider, MD  ondansetron (ZOFRAN ODT) 4 MG disintegrating tablet Allow 1-2 tablets to dissolve in your mouth every 8 hours as needed for nausea/vomiting 11/24/16   Loleta Roseory Domnic Vantol, MD  triamcinolone cream (KENALOG) 0.1 % Apply 1 application topically 2 (two) times daily. 05/18/15   Alba CoryKrichna Sowles, MD    Allergies Symbicort [budesonide-formoterol fumarate]; Gabapentin; Nortriptyline; Diclofenac; Duloxetine hcl; Montelukast sodium; Mupirocin; Naproxen; and Sulfa antibiotics  Family History  Problem Relation Age of Onset  . Adopted: Yes  . Family history unknown: Yes    Social History Social History  Substance Use Topics  . Smoking status: Former Smoker    Packs/day: 1.00    Years: 2.00    Types: Cigarettes    Quit date: 11/28/2006  . Smokeless tobacco: Never Used  . Alcohol use No     Comment: rarely    Review of Systems Constitutional: No fever/chills Eyes: No visual changes. ENT: No sore throat. Cardiovascular: Denies chest pain. Respiratory: Denies shortness of breath. Gastrointestinal: RLQ abdominal pain.  nausea, no vomiting.  Soft stools.  No constipation. Genitourinary: Negative for dysuria. Musculoskeletal: Negative for back pain. Skin: Negative for rash. Neurological: Negative for headaches, focal weakness or numbness.  10-point ROS otherwise negative.  ____________________________________________   PHYSICAL EXAM:  VITAL SIGNS: ED Triage Vitals  Enc Vitals Group     BP 11/24/16 1351 (!) 146/79     Pulse Rate 11/24/16 1351 92     Resp 11/24/16 1351 20     Temp --      Temp src --      SpO2 11/24/16 1351 99 %     Weight 11/24/16 1353 155 lb (70.3 kg)     Height 11/24/16 1353 5\' 3"  (1.6 m)     Head Circumference --      Peak Flow --      Pain Score --      Pain Loc --      Pain Edu? --      Excl. in GC? --     Constitutional: Alert and oriented.  Well appearing and in no acute distress. Eyes: Conjunctivae are normal. PERRL. EOMI. Head: Atraumatic. Nose: No congestion/rhinnorhea. Mouth/Throat: Mucous membranes are moist.  Oropharynx non-erythematous. Neck: No stridor.  No meningeal signs.   Cardiovascular: Normal rate, regular rhythm. Good peripheral circulation. Grossly normal heart sounds. Respiratory: Normal respiratory effort.  No retractions. Lungs CTAB. Gastrointestinal: Soft.  Moderate TTP of the RLQ with pain at McBurney's point and rebound tenderness.  Some guarding. Musculoskeletal: No lower extremity tenderness nor edema. No gross deformities of extremities. Neurologic:  Normal speech and language. No gross focal neurologic deficits are appreciated.  Skin:  Skin is warm, dry and intact. No rash noted. Psychiatric: Mood and affect are normal. Speech and behavior are normal.  ____________________________________________   LABS (all labs ordered are listed, but only abnormal results are displayed)  Labs Reviewed  COMPREHENSIVE  METABOLIC PANEL - Abnormal; Notable for the following:       Result Value   Calcium 8.8 (*)    All other components within normal limits  URINALYSIS, COMPLETE (UACMP) WITH MICROSCOPIC - Abnormal; Notable for the following:    Color, Urine YELLOW (*)    APPearance CLEAR (*)    Squamous Epithelial / LPF 0-5 (*)    All other components within normal limits  LIPASE, BLOOD  CBC   ____________________________________________  EKG  None - EKG not ordered by ED physician ____________________________________________  RADIOLOGY   Ct Abdomen Pelvis W Contrast  Result Date: 11/24/2016 CLINICAL DATA:  Acute onset of right lower quadrant abdominal pain, with rebound tenderness and nausea. Initial encounter. EXAM: CT ABDOMEN AND PELVIS WITH CONTRAST TECHNIQUE: Multidetector CT imaging of the abdomen and pelvis was performed using the standard protocol following bolus administration of intravenous  contrast. CONTRAST:  ISOVUE-300 IOPAMIDOL (ISOVUE-300) INJECTION 61% COMPARISON:  MRI of the lumbar spine performed 07/25/2016 FINDINGS: Lower chest: The visualized lung bases are grossly clear. The visualized portions of the mediastinum are unremarkable. Hepatobiliary: The liver is unremarkable in appearance. The gallbladder is unremarkable in appearance. The common bile duct remains normal in caliber. Pancreas: The pancreas is within normal limits. Spleen: The spleen is unremarkable in appearance. Adrenals/Urinary Tract: The adrenal glands are unremarkable in appearance. The kidneys are within normal limits. There is no evidence of hydronephrosis. No renal or ureteral stones are identified. No perinephric stranding is seen. Stomach/Bowel: The stomach is unremarkable in appearance. The small bowel is within normal limits. The appendix is normal in caliber, without evidence of appendicitis. It extends to the midline abdomen, with the cecum noted at the upper pelvis. The colon is unremarkable in appearance. Vascular/Lymphatic: The abdominal aorta is unremarkable in appearance. The inferior vena cava is grossly unremarkable. No retroperitoneal lymphadenopathy is seen. No pelvic sidewall lymphadenopathy is identified. Reproductive: The uterus is grossly unremarkable in appearance, with an intrauterine device noted in expected position. The ovaries are relatively symmetric. No suspicious adnexal masses are seen. A left ovarian follicle is noted. Minimal stranding about the bladder could reflect mild cystitis. Would correlate with the patient's symptoms. Other: No additional soft tissue abnormalities are seen. A metallic piercing is seen at the labia. Musculoskeletal: No acute osseous abnormalities are identified. The visualized musculature is unremarkable in appearance. IMPRESSION: 1. Minimal stranding about the bladder could reflect mild cystitis. 2. Otherwise unremarkable contrast-enhanced CT of the abdomen and  pelvis. Electronically Signed   By: Roanna Raider M.D.   On: 11/24/2016 17:19    ____________________________________________   PROCEDURES  Procedure(s) performed:   Procedures   Critical Care performed: No ____________________________________________   INITIAL IMPRESSION / ASSESSMENT AND PLAN / ED COURSE  Pertinent labs & imaging results that were available during my care of the patient were reviewed by me and considered in my medical decision making (see chart for details).  Labs are reassuring with no leukocytosis but the patient has marketed tenderness to palpation of the right lower quadrant with rebound and guarding.  I will proceed with CT scan of the abdomen and pelvis with oral and IV contrast.  I explained this to her and she agrees with the plan.  I believe that ovarian etiology is also possible but torsion is unlikely given the description of the pain and I believe she would benefit more from CT scan at this time than she would from an ultrasound.   Clinical Course as of Nov 24 1745  Thu Nov 24, 2016  1743 Evaluation/wrap-up completed with the use of an in-person interpreter.  I explained the reassuring results.  I explained the questionable inflammation on the CT scan but the patient has had no dysuria and no other symptoms of urinary tract infection so I will not treat empirically.  I will give a short course of Norco for pain control and gave him my usual and customary return precautions should her symptoms worsen or change.  She understands and agrees with the plan and will attempt to follow up with her regular doctor.  [CF]    Clinical Course User Index [CF] Loleta Roseory Corrie Reder, MD    ____________________________________________  FINAL CLINICAL IMPRESSION(S) / ED DIAGNOSES  Final diagnoses:  RLQ abdominal pain     MEDICATIONS GIVEN DURING THIS VISIT:  Medications  ondansetron (ZOFRAN) injection 4 mg (4 mg Intravenous Given 11/24/16 1610)  iopamidol  (ISOVUE-300) 61 % injection 100 mL (100 mLs Intravenous Contrast Given 11/24/16 1651)  iopamidol (ISOVUE-300) 61 % injection 30 mL (30 mLs Oral Contrast Given 11/24/16 1651)     NEW OUTPATIENT MEDICATIONS STARTED DURING THIS VISIT:  New Prescriptions   DOCUSATE SODIUM (COLACE) 100 MG CAPSULE    Take 1 tablet once or twice daily as needed for constipation while taking narcotic pain medicine   HYDROCODONE-ACETAMINOPHEN (NORCO/VICODIN) 5-325 MG TABLET    Take 1-2 tablets by mouth every 4 (four) hours as needed for moderate pain.   ONDANSETRON (ZOFRAN ODT) 4 MG DISINTEGRATING TABLET    Allow 1-2 tablets to dissolve in your mouth every 8 hours as needed for nausea/vomiting    Modified Medications   No medications on file    Discontinued Medications   No medications on file     Note:  This document was prepared using Dragon voice recognition software and may include unintentional dictation errors.    Loleta Roseory Nicolus Ose, MD 11/24/16 [phone]

## 2016-11-24 NOTE — ED Triage Notes (Addendum)
Per interpreter through telecom: Kat #[id number] as pt is deaf. American sign language used.  Pt c/o abd pain lower right side started this am.

## 2016-11-24 NOTE — Discharge Instructions (Signed)

## 2016-11-24 NOTE — ED Notes (Signed)
ED Provider at bedside. 

## 2016-11-24 NOTE — ED Notes (Signed)
Pt offered to use stratus interpreter services for sign language. Pt states that she would rather write on paper than use stratus machine or wait for live interpreter. p

## 2016-11-24 NOTE — ED Notes (Signed)
CT staff at bedside.  

## 2016-12-16 ENCOUNTER — Ambulatory Visit: Payer: Medicare Other | Admitting: Cardiovascular Disease

## 2016-12-23 ENCOUNTER — Ambulatory Visit (INDEPENDENT_AMBULATORY_CARE_PROVIDER_SITE_OTHER): Payer: Medicare Other | Admitting: Cardiovascular Disease

## 2016-12-23 ENCOUNTER — Encounter: Payer: Self-pay | Admitting: Cardiovascular Disease

## 2016-12-23 VITALS — BP 114/68 | HR 80 | Ht 63.0 in | Wt 155.0 lb

## 2016-12-23 DIAGNOSIS — R002 Palpitations: Secondary | ICD-10-CM | POA: Diagnosis not present

## 2016-12-23 DIAGNOSIS — R000 Tachycardia, unspecified: Secondary | ICD-10-CM

## 2016-12-23 NOTE — Patient Instructions (Signed)
Medication Instructions: Continue same medications.   Labwork: None.   Procedures/Testing: None.   Follow-Up: 1 year follow up with Dr. Arida.   Any Additional Special Instructions Will Be Listed Below (If Applicable).     If you need a refill on your cardiac medications before your next appointment, please call your pharmacy.   

## 2016-12-23 NOTE — Progress Notes (Signed)
Cardiology Office Note   Date:  12/23/2016   ID:  Yesenia Wood, DOB [birth year], MRN [id number]  PCP:  Ruel Favors, MD  Cardiologist:   Lorine Bears, MD   Chief Complaint  Patient presents with  . other    OD 6 month f/u no complaints today. Meds reviewed verbally with pt.      History of Present Illness: Yesenia Wood is a 31 y.o. female who presents for a follow-up visit regarding palpitations thought to be due to inappropriate sinus tachycardia. She is accompanied by a sign language interpreter .  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  Prior echo in [place] 2015 was completely normal. She underwent treadmill stress testing 06/23/2015 that was normal. However, her HR and BP were elevated at peak exercise, thus she was started on extended release Cardizem. She also wore a 48-hour Holter monitor given her palpitations that showed sinus tachycardia, she was advised to take Cardizem as above.   The dose of diltiazem was increased during last visit to 240 mg once daily with significant improvement in symptoms. She feels much better than before. She was mostly bothered in the past by exertional palpitations and tachycardia. This has improved significantly. She denies any chest pain. No dizziness or syncope.   Past Medical History:  Diagnosis Date  . B12 deficiency   . Bell's palsy    right sided  . Deaf   . 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 Fatigue   . Fatty liver   . Fibromyalgia   . GERD (gastroesophageal reflux disease)   . Headache   . High triglycerides   . Hyperglycemia   . Hypertension   . Moderate asthma   . Obesity   . Paresthesia   . Premature birth     Past Surgical History:  Procedure Laterality Date  . BARTHOLIN GLAND CYST REMOVAL    . COCHLEAR IMPLANT    . COCHLEAR  IMPLANT REMOVAL    . LAPAROSCOPY    . MYRINGOTOMY WITH TUBE PLACEMENT       Current Outpatient Prescriptions  Medication Sig Dispense Refill  . albuterol (PROVENTIL HFA;VENTOLIN HFA) 108 (90 BASE) MCG/ACT inhaler Inhale 2 puffs into the lungs every 4 (four) hours as needed for wheezing or shortness of breath. 6.7 g 5  . baclofen (LIORESAL) 10 MG tablet Take 1 tablet by mouth once a week. migraine    . diltiazem (CARDIZEM CD) 240 MG 24 hr capsule TAKE 1 CAPSULE (240 MG TOTAL) BY MOUTH DAILY. 30 capsule 3  . Fluticasone-Salmeterol (ADVAIR DISKUS) 500-50 MCG/DOSE AEPB Inhale 1 puff into the lungs 2 (two) times daily. 60 each 5  . HYDROcodone-acetaminophen (NORCO/VICODIN) 5-325 MG tablet Take 1-2 tablets by mouth every 4 (four) hours as needed for moderate pain. 20 tablet 0  . levETIRAcetam (KEPPRA) 250 MG tablet 2 (two) times daily.    Marland Kitchen levonorgestrel (MIRENA) 20 MCG/24HR IUD 1 Intra Uterine Device (1 each total) by Intrauterine route once. 1 each 0  . Multiple Vitamin (MULTI-VITAMINS) TABS Take 1 tablet by mouth daily as needed.    . triamcinolone cream (KENALOG) 0.1 % Apply 1 application topically 2 (two) times daily. 80 g 0   No current facility-administered medications for this visit.     Allergies:   Symbicort [budesonide-formoterol fumarate]; Gabapentin; Nortriptyline; Diclofenac; Duloxetine hcl; Montelukast  sodium; Mupirocin; Naproxen; and Sulfa antibiotics    Social History:  The patient  reports that she quit smoking about 10 years ago. Her smoking use included Cigarettes. She has a 2.00 pack-year smoking history. She has never used smokeless tobacco. She reports that she drinks alcohol. She reports that she does not use drugs.   Family History:  The patient's She was adopted. Family history is unknown by patient.    ROS:  Please see the history of present illness.   Otherwise, review of systems are positive for none.   All other systems are reviewed and negative.    PHYSICAL  EXAM: VS:  BP 114/68 (BP Location: Left Arm, Patient Position: Sitting, Cuff Size: Normal)   Pulse 80   Ht 5\' 3"  (1.6 m)   Wt 155 lb (70.3 kg)   BMI 27.46 kg/m  , BMI Body mass index is 27.46 kg/m. GEN: Well nourished, well developed, in no acute distress  HEENT: normal  Neck: no JVD, carotid bruits, or masses Cardiac: RRR; no murmurs, rubs, or gallops,no edema  Respiratory:  clear to auscultation bilaterally, normal work of breathing GI: soft, nontender, nondistended, + BS MS: no deformity or atrophy  Skin: warm and dry, no rash Neuro:  Strength and sensation are intact Psych: euthymic mood, full affect   EKG:  EKG is ordered today. The ekg ordered today demonstrates normal sinus rhythm with no significant ST or T wave changes.   Recent Labs: 03/02/2016: Magnesium 2.0; TSH 1.649 11/24/2016: ALT 15; BUN 9; Creatinine, Ser 0.92; Hemoglobin 14.1; Platelets 318; Potassium 3.6; Sodium 139    Lipid Panel    Component Value Date/Time   CHOL 192 10/10/2016 1235   CHOL 192 10/29/2015 1050   TRIG 101 10/10/2016 1235   HDL 39 (L) 10/10/2016 1235   HDL 43 10/29/2015 1050   CHOLHDL 4.9 10/10/2016 1235   VLDL 20 10/10/2016 1235   LDLCALC 133 (H) 10/10/2016 1235   LDLCALC 123 (H) 10/29/2015 1050      Wt Readings from Last 3 Encounters:  12/23/16 155 lb (70.3 kg)  11/24/16 155 lb (70.3 kg)  10/10/16 153 lb 8 oz (69.6 kg)       ASSESSMENT AND PLAN:  1.  Inappropriate sinus tachycardia: Symptoms improved significantly with diltiazem at the current dose of 240 mg once daily. Continue same medication. We discussed the importance of exercise to improve conditioning and heart rate response.  2. Shortness of breath:  Likely related to her lung disease. Previous cardiac workup including an echo and stress test were unremarkable.   Disposition:   FU with me in 12 months  Signed,   Lorine BearsMuhammad Arida, MD  12/23/2016 11:34 AM    [facility]

## 2016-12-25 NOTE — Progress Notes (Addendum)
[facility]     Assessment and Plan:  Insomnia  --This patient has a long-standing history of insomnia, which goes back over 15 years. She appears to have some psychological stressors from that time which may be contributory. -We discussed in depth the various psychological and behavioral components of insomnia and how we could approach this. I'm going to give her prescription for Ambien CR 6.25 mg daily at bedtime. We discussed that while this may be helpful short-term, it is unlikely to be helpful in the long term. -I have asked her to download a sleep app to her smart phone, and track her daily sleep times. She is going to email [place] back in one month with total sleep time per night, and then we can consider starting sleep restriction therapy. -I have asked her to try to think about identify any negative or intrusive thoughts, which could be playing a role in her insomnia.  Asthma --Was receiving allergy shots at ENT office, but stopped recently due to making migraines worst.  --Stop advair, not helping.  --IgE= 17, Abs eosinophil = 0.1 >>both normal  COPD -Possible diagnosis, but suspect predominantly asthma.  --Check CXR, alpha-1 screen, ideally she would need a PFT but I do not think we could achieve accurate results given her deafness.    Upper airway resistance syndrome --this may become sleep apnea at some point in the future, and she could be retested at that time. However, at this time does not appear that this is playing a major role in her overall sleep issues. --She should avoid sleeping in the supine position.  --Weight loss would be beneficial for this.   Excessive daytime sleepiness.  --Due to inadequate sleep time, secondary to insomnia.Yesenia Wood   Dyspnea.  --Will check echo and CXR to look for other causes of dyspnea.     Date: 12/25/2016  MRN# [id number] Yesenia Wood 12/03/1985  Referred by Dr. Dema Severin.   Yesenia Wood is a 31 y.o. old female  seen in follow up for chief complaint of  Chief Complaint  Patient presents with  . Follow-up    pt c/o cont trouble falling alseep & staying alseep and some restless sleep. breathing is baseline since last OV. pt c/o sob, occ chest tightess & mild non prod cough     HPI:    She is deaf and is present with interpreter today for asthma and several years of insomnia. At last visit she was asked to download a sleep-app as a sleep diary. She was prescribed ambien CR 6.25 at last visit, which was increased to 12.5. In the interim.  She has tried yoga, reading before bed. She has been tried flexeril and tramadol for fibromyalgia which did not help sleep.She notes that she is sleeping more but still wakes often.  She forgot about logging her sleep, overall she thinks she is in bed about 6-8 hours per night but waking up often. She thinks she is getting about 3-4 hours of sleep per night.  She continues to to feel tired during the day. She is lifting weights, and exercises, but has to stop occasionally. When she takes her rescue inhaler at these times it does not help.   She notes that her breathing is better but has some dyspnea with activity. She continues on advair 500 bid. She stopped using the emergency inhaler as it was not helping. She has tried spiriva in the past but did not notice that it helped.  Sat on 12/26/16 at rest on RA was 98% and HR of 75, after walking 200 feet sat was 98% and HR 97; no dyspnea.    sleep study  06/17/16; AHI was 0.8; The patient had elevated RERA in the supine position. Sleep efficiency was only 40%, sleep latency was 116 minutes  Echocardiogram 03/25/16; normal.   Medication:   Outpatient Encounter Prescriptions as of 12/26/2016  Medication Sig  . albuterol (PROVENTIL HFA;VENTOLIN HFA) 108 (90 BASE) MCG/ACT inhaler Inhale 2 puffs into the lungs every 4 (four) hours as needed for wheezing or shortness of breath.  . baclofen (LIORESAL) 10 MG tablet Take 1 tablet  by mouth once a week. migraine  . diltiazem (CARDIZEM CD) 240 MG 24 hr capsule TAKE 1 CAPSULE (240 MG TOTAL) BY MOUTH DAILY.  Yesenia Wood. Fluticasone-Salmeterol (ADVAIR DISKUS) 500-50 MCG/DOSE AEPB Inhale 1 puff into the lungs 2 (two) times daily.  Yesenia Wood. HYDROcodone-acetaminophen (NORCO/VICODIN) 5-325 MG tablet Take 1-2 tablets by mouth every 4 (four) hours as needed for moderate pain.  Yesenia Wood. levETIRAcetam (KEPPRA) 250 MG tablet 2 (two) times daily.  Yesenia Wood. levonorgestrel (MIRENA) 20 MCG/24HR IUD 1 Intra Uterine Device (1 each total) by Intrauterine route once.  . Multiple Vitamin (MULTI-VITAMINS) TABS Take 1 tablet by mouth daily as needed.  . triamcinolone cream (KENALOG) 0.1 % Apply 1 application topically 2 (two) times daily.   No facility-administered encounter medications on file as of 12/26/2016.      Allergies:  Symbicort [budesonide-formoterol fumarate]; Gabapentin; Nortriptyline; Diclofenac; Duloxetine hcl; Montelukast sodium; Mupirocin; Naproxen; and Sulfa antibiotics  Review of Systems: Gen:  Denies  fever, sweats. HEENT: Denies blurred vision. Cvc:  No dizziness, chest pain or heaviness Resp:   Denies cough or sputum porduction. Gi: Denies swallowing difficulty, stomach pain.incontinence Gu:  Denies bladder incontinence, burning urine Ext:   No Joint pain, stiffness. Skin: No skin rash, easy bruising. Endoc:  No polyuria, polydipsia. Psych: No depression, insomnia. Other:  All other systems were reviewed and found to be negative other than what is mentioned in the HPI.     Physical Examination:   VS: BP 124/70 (BP Location: Left Arm, Cuff Size: Normal)   Pulse 80   Ht 5\' 3"  (1.6 m)   Wt 152 lb 3.2 oz (69 kg)   SpO2 98%   BMI 26.96 kg/m   General Appearance: No distress  Neuro:without focal findings,  speech normal,  HEENT: PERRLA, EOM intact. Mallampati 2 Pulmonary: normal breath sounds, No wheezing.   CardiovascularNormal S1,S2.  No m/r/g.   Abdomen: Benign, Soft,  non-tender. Renal:  No costovertebral tenderness  GU:  Not performed at this time. Endoc: No evident thyromegaly, no signs of acromegaly. Skin:   warm, no rash. Extremities: normal, no cyanosis, clubbing.   LABORATORY PANEL:   CBC No results for input(s): WBC, HGB, HCT, PLT in the last 168 hours. ------------------------------------------------------------------------------------------------------------------  Chemistries  No results for input(s): NA, K, CL, CO2, GLUCOSE, BUN, CREATININE, CALCIUM, MG, AST, ALT, ALKPHOS, BILITOT in the last 168 hours.  Invalid input(s): GFRCGP ------------------------------------------------------------------------------------------------------------------  Cardiac Enzymes No results for input(s): TROPONINI in the last 168 hours. ------------------------------------------------------------  RADIOLOGY:   No results found for this or any previous visit. Results for orders placed during the hospital encounter of 12/31/15  DG Chest 2 View   Narrative CLINICAL DATA:  Chest pain and dizziness for 1 year  EXAM: CHEST  2 VIEW  COMPARISON:  10/10/2014  FINDINGS: The heart size and mediastinal contours are within normal limits. Both  lungs are clear. The visualized skeletal structures are unremarkable.  IMPRESSION: No active cardiopulmonary disease.   Electronically Signed   By: Alcide Clever M.D.   On: 12/31/2015 16:06    ------------------------------------------------------------------------------------------------------------------  Thank  you for allowing [facility] to assist in the care of your patient. Our recommendations are noted above.  Please contact us if we can be of further service.   Wells Guiles, MD.  [facility] Office Number: [phone]  Santiago Glad, M.D.  Stephanie Acre, M.D.  Billy Fischer, M.D  12/25/2016

## 2016-12-26 ENCOUNTER — Ambulatory Visit (INDEPENDENT_AMBULATORY_CARE_PROVIDER_SITE_OTHER): Payer: Medicare Other | Admitting: Internal Medicine

## 2016-12-26 ENCOUNTER — Encounter: Payer: Self-pay | Admitting: Internal Medicine

## 2016-12-26 VITALS — BP 124/70 | HR 80 | Ht 63.0 in | Wt 152.2 lb

## 2016-12-26 DIAGNOSIS — J4540 Moderate persistent asthma, uncomplicated: Secondary | ICD-10-CM | POA: Diagnosis not present

## 2016-12-26 DIAGNOSIS — F5101 Primary insomnia: Secondary | ICD-10-CM

## 2016-12-26 NOTE — Patient Instructions (Addendum)
--  Download a sleep app on your phone, if possible email us the results to us.   --Continue doing exercise and increase if possible.   --Will check  CXR.   --Will check for alpha-1 antitrypsin deficiency.

## 2016-12-26 NOTE — Addendum Note (Signed)
Addended by: Maxwell MarionBLANKENSHIP, MARGIE A on: 12/26/2016 11:38 AM   Modules accepted: Orders

## 2017-01-09 DIAGNOSIS — M797 Fibromyalgia: Secondary | ICD-10-CM | POA: Diagnosis not present

## 2017-01-09 DIAGNOSIS — R531 Weakness: Secondary | ICD-10-CM | POA: Diagnosis not present

## 2017-01-09 DIAGNOSIS — R202 Paresthesia of skin: Secondary | ICD-10-CM | POA: Diagnosis not present

## 2017-01-09 DIAGNOSIS — R200 Anesthesia of skin: Secondary | ICD-10-CM | POA: Diagnosis not present

## 2017-01-09 DIAGNOSIS — G43709 Chronic migraine without aura, not intractable, without status migrainosus: Secondary | ICD-10-CM | POA: Diagnosis not present

## 2017-01-18 ENCOUNTER — Other Ambulatory Visit: Payer: Self-pay | Admitting: Cardiovascular Disease

## 2017-01-19 ENCOUNTER — Other Ambulatory Visit: Payer: Self-pay | Admitting: *Deleted

## 2017-01-19 MED ORDER — FLUTICASONE-SALMETEROL 500-50 MCG/DOSE IN AEPB: 1.0000 | INHALATION_SPRAY | Freq: Two times a day (BID) | RESPIRATORY_TRACT | 5 refills | Status: DC

## 2017-03-20 DIAGNOSIS — H903 Sensorineural hearing loss, bilateral: Secondary | ICD-10-CM | POA: Diagnosis not present

## 2017-03-30 DIAGNOSIS — M542 Cervicalgia: Secondary | ICD-10-CM | POA: Diagnosis not present

## 2017-03-30 DIAGNOSIS — M791 Myalgia: Secondary | ICD-10-CM | POA: Diagnosis not present

## 2017-03-30 DIAGNOSIS — G43719 Chronic migraine without aura, intractable, without status migrainosus: Secondary | ICD-10-CM | POA: Diagnosis not present

## 2017-03-30 DIAGNOSIS — R51 Headache: Secondary | ICD-10-CM | POA: Diagnosis not present

## 2017-03-30 DIAGNOSIS — G518 Other disorders of facial nerve: Secondary | ICD-10-CM | POA: Diagnosis not present

## 2017-03-30 DIAGNOSIS — G43111 Migraine with aura, intractable, with status migrainosus: Secondary | ICD-10-CM | POA: Diagnosis not present

## 2017-03-30 DIAGNOSIS — G43109 Migraine with aura, not intractable, without status migrainosus: Secondary | ICD-10-CM | POA: Diagnosis not present

## 2017-04-11 ENCOUNTER — Ambulatory Visit
Admission: RE | Admit: 2017-04-11 | Discharge: 2017-04-11 | Disposition: A | Discharge status: Home / Self Care | Payer: Medicare Other | Source: Ambulatory Visit | Attending: Internal Medicine | Admitting: Internal Medicine

## 2017-04-11 ENCOUNTER — Other Ambulatory Visit: Payer: Self-pay | Admitting: Family Medicine

## 2017-04-11 ENCOUNTER — Encounter: Payer: Self-pay | Admitting: Family Medicine

## 2017-04-11 ENCOUNTER — Ambulatory Visit
Admission: RE | Admit: 2017-04-11 | Discharge: 2017-04-11 | Disposition: A | Discharge status: Home / Self Care | Payer: Medicare Other | Source: Ambulatory Visit | Attending: Family Medicine | Admitting: Family Medicine

## 2017-04-11 ENCOUNTER — Ambulatory Visit (INDEPENDENT_AMBULATORY_CARE_PROVIDER_SITE_OTHER): Payer: Medicare Other | Admitting: Family Medicine

## 2017-04-11 VITALS — BP 122/68 | HR 86 | Temp 98.9°F | Resp 14 | Ht 63.0 in | Wt 153.0 lb

## 2017-04-11 DIAGNOSIS — Z0000 Encounter for general adult medical examination without abnormal findings: Secondary | ICD-10-CM

## 2017-04-11 DIAGNOSIS — E538 Deficiency of other specified B group vitamins: Secondary | ICD-10-CM

## 2017-04-11 DIAGNOSIS — Z124 Encounter for screening for malignant neoplasm of cervix: Secondary | ICD-10-CM

## 2017-04-11 DIAGNOSIS — R102 Pelvic and perineal pain unspecified side: Secondary | ICD-10-CM

## 2017-04-11 DIAGNOSIS — T8332XA Displacement of intrauterine contraceptive device, initial encounter: Secondary | ICD-10-CM | POA: Diagnosis not present

## 2017-04-11 DIAGNOSIS — J4540 Moderate persistent asthma, uncomplicated: Secondary | ICD-10-CM

## 2017-04-11 DIAGNOSIS — G43709 Chronic migraine without aura, not intractable, without status migrainosus: Secondary | ICD-10-CM | POA: Diagnosis not present

## 2017-04-11 DIAGNOSIS — R0602 Shortness of breath: Secondary | ICD-10-CM | POA: Diagnosis not present

## 2017-04-11 DIAGNOSIS — Z113 Encounter for screening for infections with a predominantly sexual mode of transmission: Secondary | ICD-10-CM | POA: Diagnosis not present

## 2017-04-11 DIAGNOSIS — R002 Palpitations: Secondary | ICD-10-CM

## 2017-04-11 DIAGNOSIS — M797 Fibromyalgia: Secondary | ICD-10-CM

## 2017-04-11 NOTE — Addendum Note (Signed)
Addended by: Cynda FamiliaJOHNSON, Mykala Mccready L on: 04/11/2017 02:26 PM   Modules accepted: Orders

## 2017-04-11 NOTE — Patient Instructions (Signed)
Preventive Care 18-39 Years, Female Preventive care refers to lifestyle choices and visits with your health care provider that can promote health and wellness. What does preventive care include?  A yearly physical exam. This is also called an annual well check.  Dental exams once or twice a year.  Routine eye exams. Ask your health care provider how often you should have your eyes checked.  Personal lifestyle choices, including:  Daily care of your teeth and gums.  Regular physical activity.  Eating a healthy diet.  Avoiding tobacco and drug use.  Limiting alcohol use.  Practicing safe sex.  Taking vitamin and mineral supplements as recommended by your health care provider. What happens during an annual well check? The services and screenings done by your health care provider during your annual well check will depend on your age, overall health, lifestyle risk factors, and family history of disease. Counseling  Your health care provider may ask you questions about your:  Alcohol use.  Tobacco use.  Drug use.  Emotional well-being.  Home and relationship well-being.  Sexual activity.  Eating habits.  Work and work environment.  Method of birth control.  Menstrual cycle.  Pregnancy history. Screening  You may have the following tests or measurements:  Height, weight, and BMI.  Diabetes screening. This is done by checking your blood sugar (glucose) after you have not eaten for a while (fasting).  Blood pressure.  Lipid and cholesterol levels. These may be checked every 5 years starting at age 20.  Skin check.  Hepatitis C blood test.  Hepatitis B blood test.  Sexually transmitted disease (STD) testing.  BRCA-related cancer screening. This may be done if you have a family history of breast, ovarian, tubal, or peritoneal cancers.  Pelvic exam and Pap test. This may be done every 3 years starting at age 21. Starting at age 30, this may be done every 5  years if you have a Pap test in combination with an HPV test. Discuss your test results, treatment options, and if necessary, the need for more tests with your health care provider. Vaccines  Your health care provider may recommend certain vaccines, such as:  Influenza vaccine. This is recommended every year.  Tetanus, diphtheria, and acellular pertussis (Tdap, Td) vaccine. You may need a Td booster every 10 years.  Varicella vaccine. You may need this if you have not been vaccinated.  HPV vaccine. If you are 26 or younger, you may need three doses over 6 months.  Measles, mumps, and rubella (MMR) vaccine. You may need at least one dose of MMR. You may also need a second dose.  Pneumococcal 13-valent conjugate (PCV13) vaccine. You may need this if you have certain conditions and were not previously vaccinated.  Pneumococcal polysaccharide (PPSV23) vaccine. You may need one or two doses if you smoke cigarettes or if you have certain conditions.  Meningococcal vaccine. One dose is recommended if you are age 19-21 years and a first-year college student living in a residence hall, or if you have one of several medical conditions. You may also need additional booster doses.  Hepatitis A vaccine. You may need this if you have certain conditions or if you travel or work in places where you may be exposed to hepatitis A.  Hepatitis B vaccine. You may need this if you have certain conditions or if you travel or work in places where you may be exposed to hepatitis B.  Haemophilus influenzae type b (Hib) vaccine. You may need this   if you have certain risk factors. Talk to your health care provider about which screenings and vaccines you need and how often you need them. This information is not intended to replace advice given to you by your health care provider. Make sure you discuss any questions you have with your health care provider. Document Released: 01/10/2002 Document Revised: 08/03/2016  Document Reviewed: 09/15/2015 Elsevier Interactive Patient Education  2017 [employer].

## 2017-04-11 NOTE — Addendum Note (Signed)
Addended by: Cynda FamiliaJOHNSON, Wilberth Damon L on: 04/11/2017 12:19 PM   Modules accepted: Orders

## 2017-04-11 NOTE — Addendum Note (Signed)
Addended by: Idelle CrouchICHMOND, Annel Zunker A on: 04/11/2017 12:24 PM   Modules accepted: Orders

## 2017-04-11 NOTE — Progress Notes (Addendum)
Name: Yesenia Wood   MRN: [id number]    DOB: 1986-10-23   Date:04/11/2017       Progress Note  Subjective  Chief Complaint  Chief Complaint  Patient presents with  . Medicare Wellness    HPI  Functional ability/safety issues: No Issues Hearing issues: death  Activities of daily living: Discussed Home safety issues: No Issues  End Of Life Planning: Offered verbal information regarding advanced directives, healthcare power of attorney.  Preventative care, Health maintenance, Preventative health measures discussed.  Preventative screenings discussed today: lab work, up to date  Low Dose CT Chest recommended if Age 27-80 years, 30 pack-year currently smoking OR have quit w/in 15years. No applicable  Lifestyle risk factor issued reviewed: Diet, exercise, weight management, advised patient smoking is not healthy, nutrition/diet.  Preventative health measures discussed (5-10 year plan).  Reviewed and recommended vaccinations: - Pneumovax  - Prevnar  - Annual Influenza - Zostavax - Tdap   Depression screening: Done Fall risk screening: Done Discuss ADLs/IADLs: Done  Current medical providers: See HPI  Other health risk factors identified this visit: No other issues Cognitive impairment issues: None identified  All above discussed with patient. Appropriate education, counseling and referral will be made based upon the above.   Not sexually active for the past 2 years , has IUD to regulate cycles, no periods in about one year, last time she felt IUD screen was a couple of weeks ago, spotting every few months  B12 deficiency: she needs to resume supplementation  FMS: she states she has days that she still has pain, but not as severe as it used to be.   Palpitation: on Cardizem, sees Dr. Kirke Corin, states still has episodes of palpitation but not as severe and frequent and also notices that without severe palpitation she is not as tired  Chronic Migraine: she sees Dr. Neale Burly  and is doing better but still has migraine, she has eliminated food from her diet, including dairy and nuts because it triggers more frequent episodes  Pelvic pain: she states occasionally she has pelvic discomfort intermittently, worse when she moves over the past few weeks  Patient Active Problem List   Diagnosis Date Noted  . OSA (obstructive sleep apnea) 10/10/2016  . Other insomnia 10/10/2016  . Dissocial personality disorder 06/07/2016  . Sleep disturbance 05/10/2016  . Chronic migraine without aura 03/29/2016  . Costochondral chest pain 12/28/2015  . Pain in the chest 06/09/2015  . Palpitations 06/09/2015  . Hearing loss 05/16/2015  . B12 deficiency 05/16/2015  . Cochlear implant status 05/16/2015  . Fatty infiltration of liver 05/16/2015  . Fibromyalgia syndrome 05/16/2015  . Gastro-esophageal reflux disease without esophagitis 05/16/2015  . Generalized headache 05/16/2015  . Right-sided Bell's palsy 05/16/2015  . H/O: depression 05/16/2015  . Hypertriglyceridemia 05/16/2015  . Blood glucose elevated 05/16/2015  . Irregular menstrual cycle 05/16/2015  . Overweight 05/16/2015  . Paresthesia 05/16/2015  . Disorder of labyrinth 05/16/2015  . Dyspnea 10/07/2014  . Moderate persistent intrinsic asthma without status asthmaticus without complication 10/07/2014    Past Surgical History:  Procedure Laterality Date  . BARTHOLIN GLAND CYST REMOVAL    . COCHLEAR IMPLANT    . COCHLEAR IMPLANT REMOVAL    . LAPAROSCOPY    . MYRINGOTOMY WITH TUBE PLACEMENT      Family History  Problem Relation Age of Onset  . Adopted: Yes  . Family history unknown: Yes    Social History   Social History  . Marital status: Single  Spouse name: N/A  . Number of children: N/A  . Years of education: N/A   Occupational History  . Not on file.   Social History Main Topics  . Smoking status: Former Smoker    Packs/day: 1.00    Years: 2.00    Types: Cigarettes    Quit date: 11/28/2006   . Smokeless tobacco: Never Used  . Alcohol use 0.0 oz/week     Comment: rarely  . Drug use: No  . Sexual activity: Yes    Birth control/ protection: Pill   Other Topics Concern  . Not on file   Social History Narrative  . No narrative on file     Current Outpatient Prescriptions:  .  albuterol (PROVENTIL HFA;VENTOLIN HFA) 108 (90 BASE) MCG/ACT inhaler, Inhale 2 puffs into the lungs every 4 (four) hours as needed for wheezing or shortness of breath., Disp: 6.7 g, Rfl: 5 .  diltiazem (CARDIZEM CD) 240 MG 24 hr capsule, TAKE 1 CAPSULE (240 MG TOTAL) BY MOUTH DAILY., Disp: 30 capsule, Rfl: 3 .  levonorgestrel (MIRENA) 20 MCG/24HR IUD, 1 Intra Uterine Device (1 each total) by Intrauterine route once., Disp: 1 each, Rfl: 0 .  triamcinolone cream (KENALOG) 0.1 %, Apply 1 application topically 2 (two) times daily., Disp: 80 g, Rfl: 0  Allergies  Allergen Reactions  . Symbicort [Budesonide-Formoterol Fumarate] Anaphylaxis and Other (See Comments)    Chest pain  . Gabapentin Nausea And Vomiting  . Nortriptyline     Other reaction(s): Other (See Comments)  . Diclofenac Other (See Comments)  . Duloxetine Hcl Other (See Comments)  . Montelukast Sodium Other (See Comments)  . Mupirocin Other (See Comments)  . Naproxen Other (See Comments)  . Sulfa Antibiotics Hives     ROS  Constitutional: Negative for fever or weight change.  Respiratory: Negative for cough and shortness of breath.   Cardiovascular: Negative for chest pain or palpitations.  Gastrointestinal: Negative for abdominal pain, no bowel changes.  Musculoskeletal: Negative for gait problem or joint swelling.  Skin: Negative for rash.  Neurological: Negative for dizziness, positive for  headache.  No other specific complaints in a complete review of systems (except as listed in HPI above).  Objective  Vitals:   04/11/17 1115  BP: 122/68  Pulse: 86  Resp: 14  Temp: 98.9 F (37.2 C)  TempSrc: Oral  SpO2: 98%   Weight: 153 lb (69.4 kg)  Height: 5\' 3"  (1.6 m)    Body mass index is 27.1 kg/m.  Physical Exam  Constitutional: Patient appears well-developed and overweight . No distress.  HENT: Head: Normocephalic and atraumatic. Ears: B TMs ok, no erythema or effusion; Nose: Nose normal. Mouth/Throat: Oropharynx is clear and moist. No oropharyngeal exudate.  Eyes: Conjunctivae and EOM are normal. Pupils are equal, round, and reactive to light. No scleral icterus.  Neck: Normal range of motion. Neck supple. No JVD present. No thyromegaly present.  Cardiovascular: Normal rate, regular rhythm and normal heart sounds.  No murmur heard. No BLE edema. Pulmonary/Chest: Effort normal and breath sounds normal. No respiratory distress. Abdominal: Soft. Bowel sounds are normal, no distension. There is no tenderness. no masses Breast: lumpy breast, no nipple discharge or rashes FEMALE GENITALIA:  External genitalia normal - piercing  External urethra normal Vaginal vault normal without discharge or lesions Mild discomfort with bimanual exam Cervix friable, IUD strings not seen or felt RECTAL: not done Musculoskeletal: Normal range of motion, no joint effusions. No gross deformities. Trigger points positive  Neurological:  he is alert and oriented to person, place, and time. No cranial nerve deficit. Coordination, balance, strength, speech and gait are normal.  Skin: Skin is warm and dry. No rash noted. No erythema.  Psychiatric: Patient has a normal mood and affect. behavior is normal. Judgment and thought content normal.  PHQ2/9: Depression screen [facility] 04/11/2017 06/07/2016 03/16/2016 01/28/2016 10/01/2015  Decreased Interest 0 0 0 0 0  Down, Depressed, Hopeless 1 0 0 0 0  PHQ - 2 Score 1 0 0 0 0     Fall Risk: Fall Risk  04/11/2017 06/07/2016 03/16/2016 01/28/2016 10/01/2015  Falls in the past year? No No Yes Yes Yes  Number falls in past yr: - - 1 1 2  or more  Injury with Fall? - - Yes No No  Risk  Factor Category  - - - - -     Functional Status Survey: Is the patient deaf or have difficulty hearing?: Yes Does the patient have difficulty seeing, even when wearing glasses/contacts?: Yes Does the patient have difficulty concentrating, remembering, or making decisions?: No Does the patient have difficulty walking or climbing stairs?: No Does the patient have difficulty dressing or bathing?: No Does the patient have difficulty doing errands alone such as visiting a doctor's office or shopping?: No    Assessment & Plan  1. Medicare annual wellness visit, subsequent  Discussed importance of 150 minutes of physical activity weekly, eat two servings of fish weekly as often as possible, eat 6 servings of fruit/vegetables daily and drink plenty of water and avoid sweet beverages.   2. Chronic migraine without aura without status migrainosus, not intractable  Continue follow up with Dr. Neale BurlyFreeman  3. B12 deficiency  Resume supplementation   4. Fibromyalgia syndrome  Doing better  5. Palpitation  Continue medication and follow up with Dr. Kirke CorinArida  6. Encounter for screening for cervical cancer   - pap smear, HPV   8. Intrauterine contraceptive device threads lost, initial encounter  - US Pelvis Limited; Future

## 2017-04-13 LAB — PAP IG, CT-NG NAA, HPV HIGH-RISK
Chlamydia Probe Amp: NOT DETECTED
GC PROBE AMP: NOT DETECTED
HPV DNA HIGH RISK: NOT DETECTED

## 2017-04-17 ENCOUNTER — Ambulatory Visit
Admission: RE | Admit: 2017-04-17 | Discharge: 2017-04-17 | Disposition: A | Discharge status: Home / Self Care | Payer: Medicare Other | Source: Ambulatory Visit | Attending: Family Medicine | Admitting: Family Medicine

## 2017-04-17 DIAGNOSIS — R102 Pelvic and perineal pain: Secondary | ICD-10-CM | POA: Diagnosis not present

## 2017-04-17 DIAGNOSIS — N83202 Unspecified ovarian cyst, left side: Secondary | ICD-10-CM | POA: Insufficient documentation

## 2017-04-17 DIAGNOSIS — T8332XA Displacement of intrauterine contraceptive device, initial encounter: Secondary | ICD-10-CM

## 2017-04-28 ENCOUNTER — Telehealth: Payer: Self-pay | Admitting: Family Medicine

## 2017-04-28 NOTE — Telephone Encounter (Signed)
Informed pt that the results did not state that there was any need for concerns but if she still some concerns to just send Dr. Carlynn PurlSowles a message on mychart

## 2017-04-28 NOTE — Telephone Encounter (Signed)
Requesting return call pertaining to lab results. Please call [phone]

## 2017-05-08 DIAGNOSIS — H903 Sensorineural hearing loss, bilateral: Secondary | ICD-10-CM | POA: Diagnosis not present

## 2017-05-08 DIAGNOSIS — H9201 Otalgia, right ear: Secondary | ICD-10-CM | POA: Diagnosis not present

## 2017-05-17 NOTE — Progress Notes (Signed)
[facility]     Assessment and Plan:  Insomnia  --This patient has a long-standing history of insomnia, which goes back over 15 years. She appears to have some psychological stressors from that time which may be contributory. -She has downloaded asleep at, she is getting about 6 hours of sleep per night, which appears to be improved.  Asthma --Was receiving allergy shots at ENT office, but stopped recently due to making migraines worst.  --Stop advair, not helping.  --IgE= 17, Abs eosinophil = 0.1 >>both normal --Alpha-1 negative.   Upper airway resistance syndrome --this may become sleep apnea at some point in the future, and she could be retested at that time. However, at this time does not appear that this is playing a major role in her overall sleep issues. --She should avoid sleeping in the supine position.  --Weight loss would be beneficial for this.   Excessive daytime sleepiness.  --Due to inadequate sleep time, secondary to insomnia..       Date: 05/17/2017  MRN# [id number] Yesenia Wood January 28, 1986   [name] is a 31 y.o. old female seen in follow up for chief complaint of  Chief Complaint  Patient presents with  . Asthma    former [facility] patient she still has some day with asthma flares.     HPI:    She is deaf and is present with interpreter today for asthma and several years of insomnia.  She has continued to toss and turn but things are a bit better, she thinks. She has been tracking her sleep with an app. It says that she is sleeping about 6 hours total per night. She is feeling more rested during the day. She is no longer taking ambien or any other sleeping medications at bedtime. She does not nap during the day. She notes that she is still tired during the day, but it is doing better than before.  She continues some breathing difficulty with hot or humid days. She stopped ventolin as she did not feel that it was helping. She has  had an allergic reaction to proair, where she could not breathe at all and had to go to ER.  She still notes some difficulty breathing, can occur outside or inside, and made worse with activity such as light walking.  She has a dog at home, and in bed with her. She has stuffy nose. She has had allergy testing was allergic to trees, dust and a little to dogs.  [place], that she is around. She had been started on allergy shots for about 6 months, but it gave her severe migraines, therefore she stopped it about 6 months ago. She did not yet notice a difference in terms of her breathing.  Previous testing. **Sat on 12/26/16 at rest on RA was 98% and HR of 75, after walking 200 feet sat was 98% and HR 97; no dyspnea.   **I personally reviewed CXR films 04/11/17; unremarkable lungs.   **sleep study  06/17/16; AHI was 0.8; The patient had elevated RERA in the supine position. Sleep efficiency was only 40%, sleep latency was 116 minutes  **Echocardiogram 03/25/16; normal.   Medication:   Outpatient Encounter Prescriptions as of 05/22/2017  Medication Sig  . albuterol (PROVENTIL HFA;VENTOLIN HFA) 108 (90 BASE) MCG/ACT inhaler Inhale 2 puffs into the lungs every 4 (four) hours as needed for wheezing or shortness of breath.  . diltiazem (CARDIZEM CD)  240 MG 24 hr capsule TAKE 1 CAPSULE (240 MG TOTAL) BY MOUTH DAILY.  Marland Kitchen. levonorgestrel (MIRENA) 20 MCG/24HR IUD 1 Intra Uterine Device (1 each total) by Intrauterine route once.  . triamcinolone cream (KENALOG) 0.1 % Apply 1 application topically 2 (two) times daily.   No facility-administered encounter medications on file as of 05/22/2017.      Allergies:  Symbicort [budesonide-formoterol fumarate]; Gabapentin; Nortriptyline; Diclofenac; Duloxetine hcl; Montelukast sodium; Mupirocin; Naproxen; and Sulfa antibiotics  Review of Systems: Gen:  Denies  fever, sweats. HEENT: Denies blurred vision. Cvc:  No dizziness,  chest pain or heaviness Resp:   Denies cough or sputum porduction. Gi: Denies swallowing difficulty, stomach pain.incontinence Gu:  Denies bladder incontinence, burning urine Ext:   No Joint pain, stiffness. Skin: No skin rash, easy bruising. Endoc:  No polyuria, polydipsia. Psych: No depression, insomnia. Other:  All other systems were reviewed and found to be negative other than what is mentioned in the HPI.     Physical Examination:   VS: BP 116/80 (BP Location: Left Arm, Cuff Size: Normal)   Pulse 96   Resp 16   Ht 5\' 3"  (1.6 m)   Wt 153 lb (69.4 kg)   SpO2 96%   BMI 27.10 kg/m   General Appearance: No distress  Neuro:without focal findings,  speech normal,  HEENT: PERRLA, EOM intact. Mallampati 2 Pulmonary: normal breath sounds, No wheezing.   CardiovascularNormal S1,S2.  No m/r/g.   Abdomen: Benign, Soft, non-tender. Renal:  No costovertebral tenderness  GU:  Not performed at this time. Endoc: No evident thyromegaly, no signs of acromegaly. Skin:   warm, no rash. Extremities: normal, no cyanosis, clubbing.   LABORATORY PANEL:   CBC No results for input(s): WBC, HGB, HCT, PLT in the last 168 hours. ------------------------------------------------------------------------------------------------------------------  Chemistries  No results for input(s): NA, K, CL, CO2, GLUCOSE, BUN, CREATININE, CALCIUM, MG, AST, ALT, ALKPHOS, BILITOT in the last 168 hours.  Invalid input(s): GFRCGP ------------------------------------------------------------------------------------------------------------------  Cardiac Enzymes No results for input(s): TROPONINI in the last 168 hours. ------------------------------------------------------------  RADIOLOGY:   No results found for this or any previous visit. Results for orders placed during the hospital encounter of 12/31/15  DG Chest 2 View   Narrative CLINICAL DATA:  Chest pain and dizziness for 1 year  EXAM: CHEST  2  VIEW  COMPARISON:  10/10/2014  FINDINGS: The heart size and mediastinal contours are within normal limits. Both lungs are clear. The visualized skeletal structures are unremarkable.  IMPRESSION: No active cardiopulmonary disease.   Electronically Signed   By: Alcide CleverMark  Lukens M.D.   On: 12/31/2015 16:06    ------------------------------------------------------------------------------------------------------------------  Thank  [facility] to assist in the care of your patient. Our recommendations are noted above.  Please contact us if we can be of further service.   Wells Guileseep Blondie Riggsbee, MD.  [facility] Office Number: [phone]  Santiago Gladavid Kasa, M.D.  Stephanie AcreVishal Mungal, M.D.  Billy Fischeravid Simonds, M.D  05/17/2017

## 2017-05-22 ENCOUNTER — Encounter: Payer: Self-pay | Admitting: Internal Medicine

## 2017-05-22 ENCOUNTER — Ambulatory Visit (INDEPENDENT_AMBULATORY_CARE_PROVIDER_SITE_OTHER): Payer: Medicare Other | Admitting: Internal Medicine

## 2017-05-22 VITALS — BP 116/80 | HR 96 | Resp 16 | Ht 63.0 in | Wt 153.0 lb

## 2017-05-22 DIAGNOSIS — J4540 Moderate persistent asthma, uncomplicated: Secondary | ICD-10-CM | POA: Diagnosis not present

## 2017-05-22 DIAGNOSIS — F5101 Primary insomnia: Secondary | ICD-10-CM

## 2017-05-22 MED ORDER — TIOTROPIUM BROMIDE MONOHYDRATE 2.5 MCG/ACT IN AERS: 2.0000 | INHALATION_SPRAY | Freq: Every day | RESPIRATORY_TRACT | 0 refills | Status: DC

## 2017-05-22 NOTE — Patient Instructions (Signed)
--  She is given a sample of spiriva respimat today, if it helps, call us back and we will send a script for spiriva handihaler for you.   --Start allegra once daily.

## 2017-05-22 NOTE — Addendum Note (Signed)
Addended by: Alease FrameARTER, Jarely Juncaj S on: 05/22/2017 01:26 PM   Modules accepted: Orders

## 2017-05-31 ENCOUNTER — Other Ambulatory Visit: Payer: Self-pay | Admitting: Cardiovascular Disease

## 2017-07-10 DIAGNOSIS — R200 Anesthesia of skin: Secondary | ICD-10-CM | POA: Diagnosis not present

## 2017-07-10 DIAGNOSIS — G43709 Chronic migraine without aura, not intractable, without status migrainosus: Secondary | ICD-10-CM | POA: Diagnosis not present

## 2017-07-10 DIAGNOSIS — R202 Paresthesia of skin: Secondary | ICD-10-CM | POA: Diagnosis not present

## 2017-07-10 DIAGNOSIS — M797 Fibromyalgia: Secondary | ICD-10-CM | POA: Diagnosis not present

## 2017-07-10 DIAGNOSIS — R531 Weakness: Secondary | ICD-10-CM | POA: Diagnosis not present

## 2017-08-01 ENCOUNTER — Telehealth: Payer: Self-pay | Admitting: Cardiovascular Disease

## 2017-08-01 NOTE — Telephone Encounter (Signed)
We can decrease extended release diltiazem to 180 mg once daily.

## 2017-08-01 NOTE — Telephone Encounter (Signed)
Pt is asking if her medication Diltiazem should be decreased. Please call.

## 2017-08-01 NOTE — Telephone Encounter (Signed)
Returned call to patient. Spoke with patient via phone ASL interpreter. Patient has been reducing her medications recently from other doctors such as stopping her migraine medication as well as inhalers.  She would like to reduce the diltiazem if possible because she is concerned that her resting HR is in the low 50's. BP averages 118-120/80's. Still has frequent palpitations described as a "boom-boom" or pounding sensation. Occurs several times a day. Dizziness occurs several times a day as well sometimes with the palpitations but not always. For example, yesterday she was standing in the kitchen and got dizzy and fell. Shortness of breath is an ongoing issue for her and she says she's been treated for that by other doctors with no real relief.  First available with Dr Kirke CorinArida is 08/29/17. Patient said she would wait and see what Dr Kirke CorinArida says. Routing to Dr Kirke CorinArida for review and advise on diltiazem.

## 2017-08-02 MED ORDER — DILTIAZEM HCL ER COATED BEADS 180 MG PO CP24
180.0000 mg | ORAL_CAPSULE | Freq: Every day | ORAL | 3 refills | Status: DC
Start: 2017-08-02 — End: 2017-10-02

## 2017-08-02 NOTE — Telephone Encounter (Signed)
No answer. Left message to call back using the ASL interpreter line.

## 2017-08-02 NOTE — Telephone Encounter (Signed)
Called patient using ASL interpreter phone service. Patient verbalized understanding of medication decrease and Rx sent to patient's pharmacy. She will call us if she has any further questions or concerns. Otherwise, will follow up at 1 year f/u in January.

## 2017-09-01 ENCOUNTER — Ambulatory Visit (INDEPENDENT_AMBULATORY_CARE_PROVIDER_SITE_OTHER): Payer: Medicare Other

## 2017-09-01 DIAGNOSIS — Z23 Encounter for immunization: Secondary | ICD-10-CM

## 2017-09-15 DIAGNOSIS — H5000 Unspecified esotropia: Secondary | ICD-10-CM | POA: Diagnosis not present

## 2017-10-02 ENCOUNTER — Other Ambulatory Visit: Payer: Self-pay

## 2017-10-02 ENCOUNTER — Encounter: Payer: Self-pay | Admitting: Family Medicine

## 2017-10-02 MED ORDER — DILTIAZEM HCL ER COATED BEADS 180 MG PO CP24: 180.0000 mg | ORAL_CAPSULE | Freq: Every day | ORAL | 3 refills | Status: DC

## 2017-10-04 ENCOUNTER — Ambulatory Visit: Payer: Medicare Other | Admitting: Family Medicine

## 2017-10-04 ENCOUNTER — Telehealth: Payer: Medicare Other | Admitting: Family

## 2017-10-04 DIAGNOSIS — R1031 Right lower quadrant pain: Secondary | ICD-10-CM

## 2017-10-04 DIAGNOSIS — R21 Rash and other nonspecific skin eruption: Secondary | ICD-10-CM

## 2017-10-04 NOTE — Progress Notes (Signed)
Based on what you shared with me it looks like you have a serious condition that should be evaluated in a face to face office visit.  NOTE: Even if you have entered your credit card information for this eVisit, you will not be charged.   If you are having a true medical emergency please call 911.  If you need an urgent face to face visit, [facility] has four urgent care centers for your convenience.  If you need care fast and have a high deductible or no insurance consider:   [email or web address]  [phone]  [address], [address] [place], [place] [address] 8 am to 8 pm Monday-Friday 10 am to 4 pm Saturday-Sunday   The following sites will take your  insurance:    . [facility]  [phone] Get Driving Directions Find a Provider at this Location  [address] [place], [place] [address] . 10 am to 8 pm Monday-Friday . 12 pm to 8 pm Saturday-Sunday   . [facility]  [phone] Get Driving Directions Find a Provider at this Location  [address], [address] [place], [place] [address] . 8 am to 8 pm Monday-Friday . 9 am to 6 pm Saturday . 11 am to 6 pm Sunday   . [facility] [place]  [phone] Get Driving Directions  [address] [place],  [address] . 8 am to 8 pm Monday-Friday . 8 am to 4 pm Saturday-Sunday   Your e-visit answers were reviewed by a board certified advanced clinical practitioner to complete your personal care plan.  Thank you for using e-Visits.  

## 2017-10-05 ENCOUNTER — Ambulatory Visit (INDEPENDENT_AMBULATORY_CARE_PROVIDER_SITE_OTHER): Payer: Medicare Other | Admitting: Family Medicine

## 2017-10-05 ENCOUNTER — Encounter: Payer: Self-pay | Admitting: Family Medicine

## 2017-10-05 VITALS — BP 128/72 | HR 85 | Temp 98.6°F | Resp 16 | Ht 63.0 in | Wt 155.1 lb

## 2017-10-05 DIAGNOSIS — J343 Hypertrophy of nasal turbinates: Secondary | ICD-10-CM | POA: Diagnosis not present

## 2017-10-05 DIAGNOSIS — R109 Unspecified abdominal pain: Secondary | ICD-10-CM | POA: Diagnosis not present

## 2017-10-05 DIAGNOSIS — J029 Acute pharyngitis, unspecified: Secondary | ICD-10-CM | POA: Diagnosis not present

## 2017-10-05 DIAGNOSIS — R1031 Right lower quadrant pain: Secondary | ICD-10-CM | POA: Diagnosis not present

## 2017-10-05 DIAGNOSIS — R232 Flushing: Secondary | ICD-10-CM | POA: Diagnosis not present

## 2017-10-05 LAB — CBC
HCT: 42.2 % (ref 35.0–45.0)
Hemoglobin: 14.1 g/dL (ref 11.7–15.5)
MCH: 29 pg (ref 27.0–33.0)
MCHC: 33.4 g/dL (ref 32.0–36.0)
MCV: 86.7 fL (ref 80.0–100.0)
MPV: 10 fL (ref 7.5–12.5)
Platelets: 323 Thousand/uL (ref 140–400)
RBC: 4.87 Million/uL (ref 3.80–5.10)
RDW: 12.6 % (ref 11.0–15.0)
WBC: 6.8 Thousand/uL (ref 3.8–10.8)

## 2017-10-05 LAB — BASIC METABOLIC PANEL WITH GFR
BUN: 8 mg/dL (ref 7–25)
CO2: 27 mmol/L (ref 20–32)
Calcium: 9.1 mg/dL (ref 8.6–10.2)
Chloride: 105 mmol/L (ref 98–110)
Creat: 0.74 mg/dL (ref 0.50–1.10)
Glucose, Bld: 79 mg/dL (ref 65–139)
Potassium: 4 mmol/L (ref 3.5–5.3)
Sodium: 138 mmol/L (ref 135–146)

## 2017-10-05 LAB — POCT RAPID STREP A (OFFICE): Rapid Strep A Screen: NEGATIVE

## 2017-10-05 MED ORDER — AZELASTINE HCL 0.15 % NA SOLN: 2.0000 | Freq: Every day | NASAL | 1 refills | Status: DC

## 2017-10-05 MED ORDER — DICYCLOMINE HCL 20 MG PO TABS: 20.0000 mg | ORAL_TABLET | Freq: Four times a day (QID) | ORAL | 0 refills | Status: DC

## 2017-10-05 MED ORDER — MAGIC MOUTHWASH W/LIDOCAINE: 5.0000 mL | Freq: Three times a day (TID) | ORAL | Status: DC | PRN

## 2017-10-05 MED ORDER — MAGIC MOUTHWASH W/LIDOCAINE
5.0000 mL | Freq: Three times a day (TID) | ORAL | 0 refills | Status: DC | PRN
Start: 2017-10-05 — End: 2017-10-26

## 2017-10-05 NOTE — Progress Notes (Signed)
Name: Yesenia Wood   MRN: [id number]    DOB: 03/16/1986   Date:10/05/2017       Progress Note  Subjective  Chief Complaint  Chief Complaint  Patient presents with  . Sore Throat    for 2 weeks  . Rash    on and off for 2 weeks,     HPI  Patient is deaf; interpreter was requested upon patient making appointment last evening, however one was not able to be obtained on such short notice. Her mother accompanies her and both patient and mother agree to utilize her mother as interpreter.  Intermittent Rash: Patient presents with ongoing intermittent "rash" - worse over the last 2 weeks, but she has had this for a few years now.  No particular pattern as to when it occurs - not while stressed, hot, anxious, etc. Redness suddenly occurs on neck and chest, and feels warm; no pain or itching.  Sore Throat: Sore throat for about 2-3 weeks. Has some allergies but denies significant nasal congestion/pressure; endorses right ear has chronic pressure, no left ear pain or pressure.  Has body aches and fatigue - says she's not sure if this is illness or her fibromyalgia, but they have been worse recently.  No cough, chest pain, or abnormal shortness of breath; no fevers or chills.  She has not needed to use her albuterol inhaler. No recent sick contacts.  Abdominal Pain: Patient reports abdominal pain - right-mid lateral abdomen; this has been intermittent for years as she has alternating diarrhea and constipation, but has been worse over the last 2-3 weeks. She denies nausea or vomiting, no epigastric pain.  Pain occurs just before BM's when she is constipated (are relieved with BM), and after BM's when she has diarrhea.  She denies blood in stool or dark and tarry stools.  Denies urinary symptoms - no dysuria, frequency, or hematuria.  Patient Active Problem List   Diagnosis Date Noted  . OSA (obstructive sleep apnea) 10/10/2016  . Other insomnia 10/10/2016  . Dissocial personality disorder (HCC)  06/07/2016  . Sleep disturbance 05/10/2016  . Chronic migraine without aura 03/29/2016  . Costochondral chest pain 12/28/2015  . Pain in the chest 06/09/2015  . Palpitations 06/09/2015  . Deaf 05/16/2015  . B12 deficiency 05/16/2015  . Cochlear implant status 05/16/2015  . Fatty infiltration of liver 05/16/2015  . Fibromyalgia syndrome 05/16/2015  . Gastro-esophageal reflux disease without esophagitis 05/16/2015  . Generalized headache 05/16/2015  . Right-sided Bell's palsy 05/16/2015  . H/O: depression 05/16/2015  . Hypertriglyceridemia 05/16/2015  . Blood glucose elevated 05/16/2015  . Irregular menstrual cycle 05/16/2015  . Overweight 05/16/2015  . Paresthesia 05/16/2015  . Disorder of labyrinth 05/16/2015  . Dyspnea 10/07/2014  . Moderate persistent intrinsic asthma without status asthmaticus without complication 10/07/2014    Social History   Tobacco Use  . Smoking status: Former Smoker    Packs/day: 1.00    Years: 2.00    Pack years: 2.00    Types: Cigarettes    Last attempt to quit: 11/28/2006    Years since quitting: 10.8  . Smokeless tobacco: Never Used  Substance Use Topics  . Alcohol use: Yes    Alcohol/week: 0.0 oz    Comment: rarely     Current Outpatient Medications:  .  diltiazem (CARDIZEM CD) 180 MG 24 hr capsule, Take 1 capsule (180 mg total) daily by mouth., Disp: 90 capsule, Rfl: 3 .  levonorgestrel (MIRENA) 20 MCG/24HR IUD, 1 Intra Uterine Device (  1 each total) by Intrauterine route once., Disp: 1 each, Rfl: 0 .  Azelastine HCl 0.15 % SOLN, Place 2 sprays daily into the nose., Disp: 30 mL, Rfl: 1 .  dicyclomine (BENTYL) 20 MG tablet, Take 1 tablet (20 mg total) every 6 (six) hours by mouth., Disp: 30 tablet, Rfl: 0 .  magic mouthwash w/lidocaine SOLN, Take 5 mLs 3 (three) times daily as needed by mouth for mouth pain. Gargle and spit - do not swallow., Disp: 100 mL, Rfl: 0  Allergies  Allergen Reactions  . Symbicort [Budesonide-Formoterol  Fumarate] Anaphylaxis and Other (See Comments)    Other reaction(s): Other (See Comments) chest pain Chest pain  . Gabapentin Nausea And Vomiting  . Nortriptyline Other (See Comments)    Other reaction(s): Other (See Comments)  . Diclofenac Other (See Comments)    Other reaction(s): Other (See Comments)  . Duloxetine Hcl Other (See Comments)    Other reaction(s): Other (See Comments)  . Montelukast Sodium Other (See Comments)  . Mupirocin Other (See Comments)  . Naproxen Other (See Comments) and Rash  . Sulfa Antibiotics Hives and Rash    ROS  Ten systems reviewed and is negative except as mentioned in HPI  Objective  Vitals:   10/05/17 0801  BP: 128/72  Pulse: 85  Resp: 16  Temp: 98.6 F (37 C)  TempSrc: Oral  SpO2: 96%  Weight: 155 lb 1.6 oz (70.4 kg)  Height: 5\' 3"  (1.6 m)   Body mass index is 27.47 kg/m.  Nursing Note and Vital Signs reviewed.  Physical Exam  Constitutional: Patient appears well-developed and well-nourished.  No distress.  HEENT: head atraumatic, normocephalic, pupils equal and reactive to light, EOM's intact, TM's without erythema or bulging, no maxillary or frontal sinus pain on palpation, neck supple without lymphadenopathy, oropharynx moderately erythematous and moist without exudate. Turbinates are boggy bilaterally. Cardiovascular: Normal rate, regular rhythm, S1/S2 present.  No murmur or rub heard. No BLE edema. Pulmonary/Chest: Effort normal and breath sounds clear. No respiratory distress or retractions. Abdominal: Soft and mild tenderness to the mid-right abdomen - no RUQ or RLQ tenderness and no rebound; bowel sounds present x4 quadrants.  No CVA Tenderness Psychiatric: Patient has a normal mood and affect. behavior is normal. Judgment and thought content normal.  Recent Results (from the past 2160 hour(s))  POCT rapid strep A     Status: Normal   Collection Time: 10/05/17 10:32 AM  Result Value Ref Range   Rapid Strep A Screen  Negative Negative     Assessment & Plan  1. Right sided abdominal pain - CBC - Basic Metabolic Panel (BMET) - dicyclomine (BENTYL) 20 MG tablet; Take 1 tablet (20 mg total) every 6 (six) hours by mouth.  Dispense: 30 tablet; Refill: 0 - Advised to maintain a healthy diet and try to identify triggers and we will maintain close follow up.  2. Sore throat - POCT Rapid Strep A - CBC - magic mouthwash w/lidocaine - Azelastine HCl 0.15 % SOLN; Place 2 sprays daily into the nose.  Dispense: 30 mL; Refill: 1 - Pt unable to tolerate systemic antihistamines because they make her migraines worse; she also has documented allergy to Symbicort, so we will avoid fluticasone for the time being. - Recommend cool mist humidifier at night.  3. Flushing - CBC - Basic Metabolic Panel (BMET) - Offered reassurance that this is likely benign, and that we will continue to monitor for increased frequency/intensity.  4. Nasal turbinate hypertrophy - Azelastine HCl  0.15 % SOLN; Place 2 sprays daily into the nose.  Dispense: 30 mL; Refill: 1 - Pt unable to tolerate systemic antihistamines because they make her migraines worse; she also has documented allergy to Symbicort, so we will avoid fluticasone for the time being. - Recommend cool mist humidifier at night.  - Return for 2-3 Week Follow Up. We will discuss labs, re-assess symptoms, and determine if medication changes are needed at that time. -Red flags and when to present for emergency care or RTC including fever >101.49F, chest pain, shortness of breath,  new/worsening/un-resolving symptoms, reviewed with patient at time of visit. Follow up and care instructions discussed and provided in AVS.

## 2017-10-05 NOTE — Patient Instructions (Addendum)
Abdominal Pain, Adult Many things can cause belly (abdominal) pain. Most times, belly pain is not dangerous. Many cases of belly pain can be watched and treated at home. Sometimes belly pain is serious, though. Your doctor will try to find the cause of your belly pain. Follow these instructions at home:  Take over-the-counter and prescription medicines only as told by your doctor. Do not take medicines that help you poop (laxatives) unless told to by your doctor.  Drink enough fluid to keep your pee (urine) clear or pale yellow.  Watch your belly pain for any changes.  Keep all follow-up visits as told by your doctor. This is important. Contact a doctor if:  Your belly pain changes or gets worse.  You are not hungry, or you lose weight without trying.  You are having trouble pooping (constipated) or have watery poop (diarrhea) for more than 2-3 days.  You have pain when you pee or poop.  Your belly pain wakes you up at night.  Your pain gets worse with meals, after eating, or with certain foods.  You are throwing up and cannot keep anything down.  You have a fever. Get help right away if:  Your pain does not go away as soon as your doctor says it should.  You cannot stop throwing up.  Your pain is only in areas of your belly, such as the right side or the left lower part of the belly.  You have bloody or black poop, or poop that looks like tar.  You have very bad pain, cramping, or bloating in your belly.  You have signs of not having enough fluid or water in your body (dehydration), such as: ? Dark pee, very little pee, or no pee. ? Cracked lips. ? Dry mouth. ? Sunken eyes. ? Sleepiness. ? Weakness. This information is not intended to replace advice given to you by your health care provider. Make sure you discuss any questions you have with your health care provider. Document Released: 05/02/2008 Document Revised: 06/03/2016 Document Reviewed: 04/27/2016 Elsevier  Interactive Patient Education  2017 Elsevier Inc[employer]  Sore Throat When you have a sore throat, your throat may:  Hurt.  Burn.  Feel irritated.  Feel scratchy.  Many things can cause a sore throat, including:  An infection.  Allergies.  Dryness in the air.  Smoke or pollution.  Gastroesophageal reflux disease (GERD).  A tumor.  A sore throat can be the first sign of another sickness. It can happen with other problems, like coughing or a fever. Most sore throats go away without treatment. Follow these instructions at home:  Take over-the-counter medicines only as told by your doctor.  Drink enough fluids to keep your pee (urine) clear or pale yellow.  Rest when you feel you need to.  To help with pain, try: ? Sipping warm liquids, such as broth, herbal tea, or warm water. ? Eating or drinking cold or frozen liquids, such as frozen ice pops. ? Gargling with a salt-water mixture 3-4 times a day or as needed. To make a salt-water mixture, add -1 tsp of salt in 1 cup of warm water. Mix it until you cannot see the salt anymore. ? Sucking on hard candy or throat lozenges. ? Putting a cool-mist humidifier in your bedroom at night. ? Sitting in the bathroom with the door closed for 5-10 minutes while you run hot water in the shower.  Do not use any tobacco products, such as cigarettes, chewing tobacco, and e-cigarettes. If  you need help quitting, ask your doctor. Contact a doctor if:  You have a fever for more than 2-3 days.  You keep having symptoms for more than 2-3 days.  Your throat does not get better in 7 days.  You have a fever and your symptoms suddenly get worse. Get help right away if:  You have trouble breathing.  You cannot swallow fluids, soft foods, or your saliva.  You have swelling in your throat or neck that gets worse.  You keep feeling like you are going to throw up (vomit).  You keep throwing up. This information is not intended to replace  advice given to you by your health care provider. Make sure you discuss any questions you have with your health care provider. Document Released: 08/23/2008 Document Revised: 07/10/2016 Document Reviewed: 09/04/2015 Elsevier Interactive Patient Education  [employer]2018 Else[employer] Inc.

## 2017-10-08 ENCOUNTER — Encounter: Payer: Self-pay | Admitting: Family Medicine

## 2017-10-12 ENCOUNTER — Other Ambulatory Visit: Payer: Self-pay | Admitting: Family Medicine

## 2017-10-18 DIAGNOSIS — G43109 Migraine with aura, not intractable, without status migrainosus: Secondary | ICD-10-CM | POA: Diagnosis not present

## 2017-10-18 DIAGNOSIS — G43719 Chronic migraine without aura, intractable, without status migrainosus: Secondary | ICD-10-CM | POA: Diagnosis not present

## 2017-10-18 DIAGNOSIS — G43111 Migraine with aura, intractable, with status migrainosus: Secondary | ICD-10-CM | POA: Diagnosis not present

## 2017-10-25 ENCOUNTER — Encounter: Payer: Self-pay | Admitting: Obstetrics and Gynecology

## 2017-10-25 ENCOUNTER — Ambulatory Visit (INDEPENDENT_AMBULATORY_CARE_PROVIDER_SITE_OTHER): Payer: Medicare Other | Admitting: Obstetrics and Gynecology

## 2017-10-25 VITALS — BP 113/79 | HR 84 | Wt 152.1 lb

## 2017-10-25 DIAGNOSIS — R102 Pelvic and perineal pain: Secondary | ICD-10-CM

## 2017-10-25 DIAGNOSIS — Z30432 Encounter for removal of intrauterine contraceptive device: Secondary | ICD-10-CM

## 2017-10-25 MED ORDER — NORETHIN ACE-ETH ESTRAD-FE 1-20 MG-MCG PO TABS: 1.0000 | ORAL_TABLET | Freq: Every day | ORAL | 11 refills | Status: DC

## 2017-10-25 NOTE — Progress Notes (Signed)
Yesenia Wood is a 31 y.o. year old single white deaf female. who presents for removal of a Mirena IUD. Her Mirena IUD was placed 3 years ago. She is having intermittent lower pelvic pains and had another ovarian cysts in May of this year. Desires IUD out and possibly going back to pills and/or BTL. She does not ever want to have children. Had requested BTL 6 years ago and was denied due to age.   Not currently sexually active but desires BC in case she resumes in the future. And also hopes it helps with painful menses.     No LMP recorded. Patient is not currently having periods (Reason: IUD). BP 113/79   Pulse 84   Wt 152 lb 1.6 oz (69 kg)   BMI 26.94 kg/m   Time out was performed.  A pederson speculum was placed in the vagina.  The cervix was visualized, and the strings were visible. They were grasped and the Mirena was easily removed intact without complications.  Pelvic exam: normal external genitalia, vulva, vagina, cervix, uterus and adnexa.  A:dysmenorrhea H/O ovarian cyst IUD removal  P:  Switch to Junel 1/20  Discussed BTL and possible future issues with painful menses and ovarian cyst. Will consider.   F/U 2 weeks for pelvic ultrasound and to see Dr Valentino Saxonherry to discuss BTL.  Yesenia Wood Yesenia Wood, CNM

## 2017-10-25 NOTE — Patient Instructions (Signed)
Laparoscopic Tubal Ligation Laparoscopic tubal ligation is a procedure to close the fallopian tubes. This is done so that you cannot get pregnant. When the fallopian tubes are closed, the eggs that your ovaries release cannot enter the uterus, and sperm cannot reach the released eggs. A laparoscopic tubal ligation is sometimes called "getting your tubes tied." You should not have this procedure if you want to get pregnant someday or if you are unsure about having more children. Tell a health care provider about:  Any allergies you have.  All medicines you are taking, including vitamins, herbs, eye drops, creams, and over-the-counter medicines.  Any problems you or family members have had with anesthetic medicines.  Any blood disorders you have.  Any surgeries you have had.  Any medical conditions you have.  Whether you are pregnant or may be pregnant.  Any past pregnancies. What are the risks? Generally, this is a safe procedure. However, problems may occur, including:  Infection.  Bleeding.  Injury to surrounding organs.  Side effects from anesthetics.  Failure of the procedure.  This procedure can increase your risk of a kind of pregnancy in which a fertilized egg attaches to the outside of the uterus (ectopic pregnancy). What happens before the procedure?  Ask your health care provider about: ? Changing or stopping your regular medicines. This is especially important if you are taking diabetes medicines or blood thinners. ? Taking medicines such as aspirin and ibuprofen. These medicines can thin your blood. Do not take these medicines before your procedure if your health care provider instructs you not to.  Follow instructions from your health care provider about eating and drinking restrictions.  Plan to have someone take you home after the procedure.  If you go home right after the procedure, plan to have someone with you for 24 hours. What happens during the  procedure?  You will be given one or more of the following: ? A medicine to help you relax (sedative). ? A medicine to numb the area (local anesthetic). ? A medicine to make you fall asleep (general anesthetic). ? A medicine that is injected into an area of your body to numb everything below the injection site (regional anesthetic).  An IV tube will be inserted into one of your veins. It will be used to give you medicines and fluids during the procedure.  Your bladder may be emptied with a small tube (catheter).  If you have been given a general anesthetic, a tube will be put down your throat to help you breathe.  Two small cuts (incisions) will be made in your lower abdomen and near your belly button.  Your abdomen will be inflated with a gas. This will let the surgeon see better and will give the surgeon room to work.  A thin, lighted tube (laparoscope) with a camera attached will be inserted into your abdomen through one of the incisions. Small instruments will be inserted through the other incision.  The fallopian tubes will be tied off, burned (cauterized), or blocked with a clip, ring, or clamp. A small portion in the center of each fallopian tube may be removed.  The gas will be released from the abdomen.  The incisions will be closed with stitches (sutures).  A bandage (dressing) will be placed over the incisions. The procedure may vary among health care providers and hospitals. What happens after the procedure?  Your blood pressure, heart rate, breathing rate, and blood oxygen level will be monitored often until the medicines you   were given have worn off.  You will be given medicine to help with pain, nausea, and vomiting as needed. This information is not intended to replace advice given to you by your health care provider. Make sure you discuss any questions you have with your health care provider. Document Released: 02/20/2001 Document Revised: 04/21/2016 Document  Reviewed: 10/25/2015 Elsevier Interactive Patient Education  2018 Elsevier Inc.  

## 2017-10-26 ENCOUNTER — Encounter: Payer: Self-pay | Admitting: Family Medicine

## 2017-10-26 ENCOUNTER — Ambulatory Visit (INDEPENDENT_AMBULATORY_CARE_PROVIDER_SITE_OTHER): Payer: Medicare Other | Admitting: Family Medicine

## 2017-10-26 ENCOUNTER — Telehealth: Payer: Self-pay | Admitting: Family Medicine

## 2017-10-26 VITALS — BP 90/70 | HR 73 | Resp 14 | Ht 63.0 in | Wt 151.7 lb

## 2017-10-26 DIAGNOSIS — R232 Flushing: Secondary | ICD-10-CM

## 2017-10-26 DIAGNOSIS — J029 Acute pharyngitis, unspecified: Secondary | ICD-10-CM | POA: Diagnosis not present

## 2017-10-26 DIAGNOSIS — K219 Gastro-esophageal reflux disease without esophagitis: Secondary | ICD-10-CM

## 2017-10-26 DIAGNOSIS — R109 Unspecified abdominal pain: Secondary | ICD-10-CM

## 2017-10-26 MED ORDER — LIDOCAINE VISCOUS 2 % MT SOLN: 5.0000 mL | OROMUCOSAL | 0 refills | Status: DC | PRN

## 2017-10-26 MED ORDER — DICYCLOMINE HCL 20 MG PO TABS
20.0000 mg | ORAL_TABLET | Freq: Four times a day (QID) | ORAL | 0 refills | Status: DC
Start: 2017-10-26 — End: 2017-11-24

## 2017-10-26 MED ORDER — PANTOPRAZOLE SODIUM 20 MG PO TBEC: 20.0000 mg | DELAYED_RELEASE_TABLET | Freq: Every day | ORAL | 0 refills | Status: DC

## 2017-10-26 NOTE — Addendum Note (Signed)
Addended by: Maurice SmallBOYCE, Andrell Bergeson E on: 10/26/2017 11:02 AM   Modules accepted: Orders

## 2017-10-26 NOTE — Telephone Encounter (Signed)
Pt is requesting a refill on Bentyl .

## 2017-10-26 NOTE — Patient Instructions (Addendum)
Food Choices for Gastroesophageal Reflux Disease, Adult When you have gastroesophageal reflux disease (GERD), the foods you eat and your eating habits are very important. Choosing the right foods can help ease your discomfort. What guidelines do I need to follow?  Choose fruits, vegetables, whole grains, and low-fat dairy products.  Choose low-fat meat, fish, and poultry.  Limit fats such as oils, salad dressings, butter, nuts, and avocado.  Keep a food diary. This helps you identify foods that cause symptoms.  Avoid foods that cause symptoms. These may be different for everyone.  Eat small meals often instead of 3 large meals a day.  Eat your meals slowly, in a place where you are relaxed.  Limit fried foods.  Cook foods using methods other than frying.  Avoid drinking alcohol.  Avoid drinking large amounts of liquids with your meals.  Avoid bending over or lying down until 2-3 hours after eating. What foods are not recommended? These are some foods and drinks that may make your symptoms worse: Vegetables  Tomatoes. Tomato juice. Tomato and spaghetti sauce. Chili peppers. Onion and garlic. Horseradish. Fruits  Oranges, grapefruit, and lemon (fruit and juice). Meats  High-fat meats, fish, and poultry. This includes hot dogs, ribs, ham, sausage, salami, and bacon. Dairy  Whole milk and chocolate milk. Sour cream. Cream. Butter. Ice cream. Cream cheese. Drinks  Coffee and tea. Bubbly (carbonated) drinks or energy drinks. Condiments  Hot sauce. Barbecue sauce. Sweets/Desserts  Chocolate and cocoa. Donuts. Peppermint and spearmint. Fats and Oils  High-fat foods. This includes French fries and potato chips. Other  Vinegar. Strong spices. This includes black pepper, white pepper, red pepper, cayenne, curry powder, cloves, ginger, and chili powder. The items listed above may not be a complete list of foods and drinks to avoid. Contact your dietitian for more information.    This information is not intended to replace advice given to you by your health care provider. Make sure you discuss any questions you have with your health care provider. Document Released: 05/15/2012 Document Revised: 04/21/2016 Document Reviewed: 09/18/2013 Elsevier Interactive Patient Education  2017 Elsevier Inc.  

## 2017-10-26 NOTE — Addendum Note (Signed)
Addended by: Tommie RaymondBOOKER, CRYSTAL L on: 10/26/2017 11:05 AM   Modules accepted: Orders

## 2017-10-26 NOTE — Progress Notes (Signed)
Name: Yesenia Wood   MRN: [id number]    DOB: [birth year]   Date:10/26/2017       Progress Note  Subjective  Chief Complaint  Chief Complaint  Patient presents with  . Abdominal Pain  . Sore Throat  . Rash    HPI  Patient is deaf and interpreter in present during encounter.  Flushing: Patient presents with ongoing intermittent flushing for a few years now.  No particular pattern as to when it occurs - not while stressed, hot, anxious, etc. Redness suddenly occurs on neck and chest, and feels warm; no pain or itching.  Discussed medication options, but pt frequently has bad reactions to medications and would like to wait on trying anything as it is not getting worse.  Sore Throat: Sore throat for about 4-6 weeks. Has some allergies but denies significant nasal congestion/pressure - tried flonase but had reaction to this (sick/dizzy/stinging).  Also tried magic mouthwash and says this caused stinging in her throat.  Has body aches and fatigue, but says this is baseline. No cough, chest pain, or abnormal shortness of breath; no fevers or chills.  She has not needed to use her albuterol inhaler. Labs at last visit on 10/05/2017 - CBC and BMP were normal; negative strep.  Abdominal Pain: Patient reports ongoing abdominal pain - right-mid lateral abdomen; this has been intermittent for years as she has alternating diarrhea and constipation, no longer constant.  Bentyl has helped a little bit. She denies nausea or vomiting, occasional epigastric pain.  Pain occurs just before BM's when she is constipated (are relieved with BM), and after BM's when she has diarrhea.  She denies blood in stool or dark and tarry stools.  Denies urinary symptoms - no dysuria, frequency, or hematuria.  Labs at last visit on 10/05/2017 - CBC and BMP were normal; negative strep.  We will check H. Pylori today.  She has never seen GI regarding this pain in the past - would like to try protonix and do H. Pylori testing prior to  referral.  Patient Active Problem List   Diagnosis Date Noted  . OSA (obstructive sleep apnea) 10/10/2016  . Other insomnia 10/10/2016  . Dissocial personality disorder (HCC) 06/07/2016  . Sleep disturbance 05/10/2016  . Chronic migraine without aura 03/29/2016  . Costochondral chest pain 12/28/2015  . Pain in the chest 06/09/2015  . Palpitations 06/09/2015  . Deaf 05/16/2015  . B12 deficiency 05/16/2015  . Cochlear implant status 05/16/2015  . Fatty infiltration of liver 05/16/2015  . Fibromyalgia syndrome 05/16/2015  . Gastro-esophageal reflux disease without esophagitis 05/16/2015  . Generalized headache 05/16/2015  . Right-sided Bell's palsy 05/16/2015  . H/O: depression 05/16/2015  . Hypertriglyceridemia 05/16/2015  . Blood glucose elevated 05/16/2015  . Irregular menstrual cycle 05/16/2015  . Overweight 05/16/2015  . Paresthesia 05/16/2015  . Disorder of labyrinth 05/16/2015  . Dyspnea 10/07/2014  . Moderate persistent intrinsic asthma without status asthmaticus without complication 10/07/2014    Social History   Tobacco Use  . Smoking status: Former Smoker    Packs/day: 1.00    Years: 2.00    Pack years: 2.00    Types: Cigarettes    Last attempt to quit: 11/28/2006    Years since quitting: 10.9  . Smokeless tobacco: Never Used  Substance Use Topics  . Alcohol use: Yes    Alcohol/week: 0.0 oz    Comment: rarely     Current Outpatient Medications:  .  dicyclomine (BENTYL) 20 MG tablet, Take 1  tablet (20 mg total) every 6 (six) hours by mouth., Disp: 30 tablet, Rfl: 0 .  diltiazem (CARDIZEM CD) 180 MG 24 hr capsule, Take 1 capsule (180 mg total) daily by mouth., Disp: 90 capsule, Rfl: 3 .  levonorgestrel (MIRENA) 20 MCG/24HR IUD, 1 Intra Uterine Device (1 each total) by Intrauterine route once., Disp: 1 each, Rfl: 0 .  magic mouthwash w/lidocaine SOLN, Take 5 mLs 3 (three) times daily as needed by mouth for mouth pain. Gargle and spit - do not swallow., Disp:  100 mL, Rfl: 0 .  norethindrone-ethinyl estradiol (JUNEL FE 1/20) 1-20 MG-MCG tablet, Take 1 tablet by mouth daily., Disp: 1 Package, Rfl: 11  Allergies  Allergen Reactions  . Symbicort [Budesonide-Formoterol Fumarate] Anaphylaxis and Other (See Comments)    Other reaction(s): Other (See Comments) chest pain Chest pain  . Astelin [Azelastine]     Tingling, numbness, nausea  . Gabapentin Nausea And Vomiting  . Nortriptyline Other (See Comments)    Other reaction(s): Other (See Comments)  . Diclofenac Other (See Comments)    Other reaction(s): Other (See Comments)  . Duloxetine Hcl Other (See Comments)    Other reaction(s): Other (See Comments)  . Montelukast Sodium Other (See Comments)  . Mupirocin Other (See Comments)  . Naproxen Other (See Comments) and Rash  . Sulfa Antibiotics Hives and Rash    ROS  Constitutional: Negative for fever or weight change.  Respiratory: Negative for cough and shortness of breath.   Cardiovascular: Negative for chest pain or palpitations.  Gastrointestinal: See HPI. Musculoskeletal: Negative for gait problem or joint swelling.  Skin: Negative for rash.  Neurological: Negative for dizziness or headache.  No other specific complaints in a complete review of systems (except as listed in HPI above).  Objective  Vitals:   10/26/17 0811  BP: 90/70  Pulse: 73  Resp: 14  SpO2: 98%  Weight: 151 lb 11.2 oz (68.8 kg)  Height: 5\' 3"  (1.6 m)   Body mass index is 26.87 kg/m.  Nursing Note and Vital Signs reviewed.  Physical Exam  Constitutional: Patient appears well-developed and well-nourished. Obese. No distress.  HEENT: head atraumatic, normocephalic Cardiovascular: Normal rate, regular rhythm, S1/S2 present.  No murmur or rub heard. No BLE edema. Pulmonary/Chest: Effort normal and breath sounds clear. No respiratory distress or retractions. Abdominal: Soft and non-tender, bowel sounds present x4 quadrants. Psychiatric: Patient has a  normal mood and affect. behavior is normal. Judgment and thought content normal.  Recent Results (from the past 2160 hour(s))  CBC     Status: None   Collection Time: 10/05/17  8:50 AM  Result Value Ref Range   WBC 6.8 3.8 - 10.8 Thousand/uL   RBC 4.87 3.80 - 5.10 Million/uL   Hemoglobin 14.1 11.7 - 15.5 g/dL   HCT 40.942.2 81.135.0 - 91.445.0 %   MCV 86.7 80.0 - 100.0 fL   MCH 29.0 27.0 - 33.0 pg   MCHC 33.4 32.0 - 36.0 g/dL   RDW 78.212.6 95.611.0 - 21.315.0 %   Platelets 323 140 - 400 Thousand/uL   MPV 10.0 7.5 - 12.5 fL  Basic Metabolic Panel (BMET)     Status: None   Collection Time: 10/05/17  8:50 AM  Result Value Ref Range   Glucose, Bld 79 65 - 139 mg/dL    Comment: .        Non-fasting reference interval .    BUN 8 7 - 25 mg/dL   Creat 0.860.74 5.780.50 - 4.691.10 mg/dL  BUN/Creatinine Ratio NOT APPLICABLE 6 - 22 (calc)   Sodium 138 135 - 146 mmol/L   Potassium 4.0 3.5 - 5.3 mmol/L   Chloride 105 98 - 110 mmol/L   CO2 27 20 - 32 mmol/L   Calcium 9.1 8.6 - 10.2 mg/dL  POCT rapid strep A     Status: Normal   Collection Time: 10/05/17 10:32 AM  Result Value Ref Range   Rapid Strep A Screen Negative Negative     Assessment & Plan  1. Gastro-esophageal reflux disease without esophagitis - pantoprazole (PROTONIX) 20 MG tablet; Take 1 tablet (20 mg total) by mouth daily.  Dispense: 90 tablet; Refill: 0 - Avoid Triggers  2. Sore throat - lidocaine (XYLOCAINE) 2 % solution; Use as directed 5 mLs in the mouth or throat as needed for mouth pain. Gargle and spit - DO NOT SWALLOW  Dispense: 100 mL; Refill: 0 - pantoprazole (PROTONIX) 20 MG tablet; Take 1 tablet (20 mg total) by mouth daily.  Dispense: 90 tablet; Refill: 0  3. Right sided abdominal pain - pantoprazole (PROTONIX) 20 MG tablet; Take 1 tablet (20 mg total) by mouth daily.  Dispense: 90 tablet; Refill: 0 - H. pylori breath test  4. Flushing - Discussed medications, will hold off for now as she is tolerating the intermittent flushing  without issue at this time.  -Red flags and when to present for emergency care or RTC including fever >101.50F, chest pain, shortness of breath, new/worsening/un-resolving symptoms, severe abdominal pain, vomiting, reviewed with patient at time of visit. Follow up and care instructions discussed and provided in AVS.

## 2017-10-27 LAB — H. PYLORI BREATH TEST: H. pylori Breath Test: NOT DETECTED

## 2017-11-06 ENCOUNTER — Encounter: Payer: Medicare Other | Admitting: Obstetrics and Gynecology

## 2017-11-06 ENCOUNTER — Other Ambulatory Visit: Payer: Medicare Other

## 2017-11-15 ENCOUNTER — Other Ambulatory Visit: Payer: Self-pay

## 2017-11-15 ENCOUNTER — Other Ambulatory Visit: Payer: Self-pay | Admitting: Family Medicine

## 2017-11-15 DIAGNOSIS — R109 Unspecified abdominal pain: Secondary | ICD-10-CM

## 2017-11-16 NOTE — Telephone Encounter (Signed)
Please message (Use Mychart to communicate) patient and ask if she is already in need of a refill - if so, she is taking too frequently.  This should be an as needed medication for stomach cramping/pain.  I will provide refill to get her through to until her follow up if she needs.

## 2017-11-20 ENCOUNTER — Ambulatory Visit (INDEPENDENT_AMBULATORY_CARE_PROVIDER_SITE_OTHER): Payer: Medicare Other

## 2017-11-20 ENCOUNTER — Encounter: Payer: Self-pay | Admitting: Obstetrics and Gynecology

## 2017-11-20 ENCOUNTER — Ambulatory Visit (INDEPENDENT_AMBULATORY_CARE_PROVIDER_SITE_OTHER): Payer: Medicare Other | Admitting: Obstetrics and Gynecology

## 2017-11-20 VITALS — BP 99/65 | HR 89 | Ht 63.0 in | Wt 150.3 lb

## 2017-11-20 DIAGNOSIS — N920 Excessive and frequent menstruation with regular cycle: Secondary | ICD-10-CM | POA: Diagnosis not present

## 2017-11-20 DIAGNOSIS — R102 Pelvic and perineal pain: Secondary | ICD-10-CM

## 2017-11-20 DIAGNOSIS — Z308 Encounter for other contraceptive management: Secondary | ICD-10-CM | POA: Diagnosis not present

## 2017-11-20 MED ORDER — LEVONORGEST-ETH ESTRAD 91-DAY 0.15-0.03 MG PO TABS: 1.0000 | ORAL_TABLET | Freq: Every day | ORAL | 4 refills | Status: DC

## 2017-11-20 NOTE — Patient Instructions (Signed)
Laparoscopic Tubal Ligation Laparoscopic tubal ligation is a procedure to close the fallopian tubes. This is done so that you cannot get pregnant. When the fallopian tubes are closed, the eggs that your ovaries release cannot enter the uterus, and sperm cannot reach the released eggs. A laparoscopic tubal ligation is sometimes called "getting your tubes tied." You should not have this procedure if you want to get pregnant someday or if you are unsure about having more children. Tell a health care provider about:  Any allergies you have.  All medicines you are taking, including vitamins, herbs, eye drops, creams, and over-the-counter medicines.  Any problems you or family members have had with anesthetic medicines.  Any blood disorders you have.  Any surgeries you have had.  Any medical conditions you have.  Whether you are pregnant or may be pregnant.  Any past pregnancies. What are the risks? Generally, this is a safe procedure. However, problems may occur, including:  Infection.  Bleeding.  Injury to surrounding organs.  Side effects from anesthetics.  Failure of the procedure.  This procedure can increase your risk of a kind of pregnancy in which a fertilized egg attaches to the outside of the uterus (ectopic pregnancy). What happens before the procedure?  Ask your health care provider about: ? Changing or stopping your regular medicines. This is especially important if you are taking diabetes medicines or blood thinners. ? Taking medicines such as aspirin and ibuprofen. These medicines can thin your blood. Do not take these medicines before your procedure if your health care provider instructs you not to.  Follow instructions from your health care provider about eating and drinking restrictions.  Plan to have someone take you home after the procedure.  If you go home right after the procedure, plan to have someone with you for 24 hours. What happens during the  procedure?  You will be given one or more of the following: ? A medicine to help you relax (sedative). ? A medicine to numb the area (local anesthetic). ? A medicine to make you fall asleep (general anesthetic). ? A medicine that is injected into an area of your body to numb everything below the injection site (regional anesthetic).  An IV tube will be inserted into one of your veins. It will be used to give you medicines and fluids during the procedure.  Your bladder may be emptied with a small tube (catheter).  If you have been given a general anesthetic, a tube will be put down your throat to help you breathe.  Two small cuts (incisions) will be made in your lower abdomen and near your belly button.  Your abdomen will be inflated with a gas. This will let the surgeon see better and will give the surgeon room to work.  A thin, lighted tube (laparoscope) with a camera attached will be inserted into your abdomen through one of the incisions. Small instruments will be inserted through the other incision.  The fallopian tubes will be tied off, burned (cauterized), or blocked with a clip, ring, or clamp. A small portion in the center of each fallopian tube may be removed.  The gas will be released from the abdomen.  The incisions will be closed with stitches (sutures).  A bandage (dressing) will be placed over the incisions. The procedure may vary among health care providers and hospitals. What happens after the procedure?  Your blood pressure, heart rate, breathing rate, and blood oxygen level will be monitored often until the medicines you   were given have worn off.  You will be given medicine to help with pain, nausea, and vomiting as needed. This information is not intended to replace advice given to you by your health care provider. Make sure you discuss any questions you have with your health care provider. Document Released: 02/20/2001 Document Revised: 04/21/2016 Document  Reviewed: 10/25/2015 Elsevier Interactive Patient Education  2018 [employer]      Pelvic Pain, Female Pelvic pain is pain in your lower abdomen, below your belly button and between your hips. The pain may start suddenly (acute), keep coming back (recurring), or last a long time (chronic). Pelvic pain that lasts longer than six months is considered chronic. Pelvic pain may affect your:  Reproductive organs.  Urinary system.  Digestive tract.  Musculoskeletal system.  There are many potential causes of pelvic pain. Sometimes, the pain can be a result of digestive or urinary conditions, strained muscles or ligaments, or even reproductive conditions. Sometimes the cause of pelvic pain is not known. Follow these instructions at home:  Take over-the-counter and prescription medicines only as told by your health care provider.  Rest as told by your health care provider.  Do not have sex it if hurts.  Keep a journal of your pelvic pain. Write down: ? When the pain started. ? Where the pain is located. ? What seems to make the pain better or worse, such as food or your menstrual cycle. ? Any symptoms you have along with the pain.  Keep all follow-up visits as told by your health care provider. This is important. Contact a health care provider if:  Medicine does not help your pain.  Your pain comes back.  You have new symptoms.  You have abnormal vaginal discharge or bleeding, including bleeding after menopause.  You have a fever or chills.  You are constipated.  You have blood in your urine or stool.  You have foul-smelling urine.  You feel weak or lightheaded. Get help right away if:  You have sudden severe pain.  Your pain gets steadily worse.  You have severe pain along with fever, nausea, vomiting, or excessive sweating.  You lose consciousness. This information is not intended to replace advice given to you by your health care provider. Make sure you  discuss any questions you have with your health care provider. Document Released: 10/11/2004 Document Revised: 12/09/2015 Document Reviewed: 09/04/2015 Elsevier Interactive Patient Education  2018 [employer]

## 2017-11-20 NOTE — Progress Notes (Signed)
GYNECOLOGY PROGRESS NOTE  Subjective:    Patient ID: Yesenia Wood, female    DOB: 09/19/1986, 31 y.o.   MRN: [id number]  Sign language interpreter present for today's exam.   HPI  Patient is a 31 y.o. P0 deaf Caucasian female who [facility] discussion of tubal ligation, as well as pelvic pain.  Patient also had an ultrasound today and desires to discuss results.   She has been having pain ongoing for at least 1-2 years, intermittently.  Pain is described as sharp, sometimes achy.  Is not always associated with her menstrual cycle.  She had a Mirena IUD placed in 2016, which she recently had removed last month as she thought this may be the cause of her pain.  Also has a h/o ovarian cysts. She is currently on Junel Fe OCPs.  Notes that it helps some with the pain, but not always.  She also notes that she passes a lot of clots with her menses.  Cycles usually last 3-5 days, and are only sometimes heavy.   Patient also desires to discuss tubal ligation.  States that she has known since a young age that she did not desire children.  Has requested to have her tubes tied over the years, but previous providers declined to perform procedure due to her age.  Patient states that she is not currently sexually active, but even if she does marry, she would consider adoption if he desired children as she did not desire to bear children of her own.     The following portions of the patient's history were reviewed and updated as appropriate: allergies, current medications, past family history, past medical history, past social history, past surgical history and problem list.  Review of Systems Pertinent items noted in HPI and remainder of comprehensive ROS otherwise negative.   Objective:   Blood pressure 99/65, pulse 89, height 5\' 3"  (1.6 m), weight 150 lb 4.8 oz (68.2 kg). General appearance: alert and no distress Abdomen: soft, non-tender; bowel sounds  normal; no masses,  no organomegaly Pelvic: external genitalia normal, rectovaginal septum normal.  Vagina without discharge.  Cervix normal appearing, no lesions and no motion tenderness.  Uterus mobile, nontender, normal shape and size.  Adnexae non-palpable, nontender bilaterally.  Extremities: extremities normal, atraumatic, no cyanosis or edema Neurologic: Grossly normal   Imaging:  ULTRASOUND REPORT  Location: [facility] Date of Service:  11/20/17   Indications: F/U Left Ovarian Cyst Findings:  The uterus measures 7.6 x 4.4 x 4.0 cm. Echo texture is homogeneous without evidence of focal masses. The Endometrium measures 1.7 mm.  Right Ovary measures 2.2 x 1.8 x 1.6 cm and appears WNL.  Left Ovary measures 2.8 x 1.8 x 1.9 cm and appears WNL.  Survey of the adnexa demonstrates no adnexal masses. There is no free fluid in the cul de sac.  Impression: 1. Retroverted uterus appears of normal size and contour. 2. The endometrium measures 1.7 mm. 3. Bilateral ovaries appear WNL.  Recommendations: 1.Clinical correlation with the patient's History and Physical Exam.   Kari BaarsJill Long, RDMS   Assessment:   Pelvic pain Tubal ligation counseling  Plan:   - Discussed ultrasound results with patient.  Currently no significant findings that could be potential cause of patient's pelvic pain.  - Discussed causes of pelvic pain, including endometriosis, urinary tract disorders (UTI, interstitial cystis), GI disorders (constipation, irritable bowel syndrome).  Will rule out GYN cause first,  and if no identifiable cause found and pain does not improve, can consider consultation to other services.  Also given option of trying pelvic floor physical therapy for her pain.  Patient willing to try.  Referral placed.  Advised that if patient is considering surgical sterilization with BTL, a laparoscopic view of the pelvis may be in order, with potential for biopies to r/o  endometriosis.  Also discussed other possible management optios for cycles.  Discussed treatment with continuous OCP regimen.  Patient notes that she has tried several OCPs in the past, as well as other contraception options.  Notes Seasonale/Seasonique worked very well for her cycle.  Will switch to Seasonale from JosephJunel. To begin after completing current pill pack.  - Patient desires permanent sterilization.  Notes that she has been definite for a long time that she did not desire to bear children.  Other reversible forms of contraception were discussed with patient; she declines all other modalities. Risks of procedure discussed with patient including but not limited to: risk of regret, permanence of method, bleeding, infection, injury to surrounding organs and need for additional procedures.  Failure risk of 1-2 % with increased risk of ectopic gestation if pregnancy occurs was also discussed with patient.  Patient verbalized understanding of these risks and wants to proceed with sterilization. Handout given for patient to review.  She would like to schedule the procedure in ~ 3 months.  Patient to return in 3 month for further discussion and pre-operative appointment.  - Menstrual clotting (menorrhagia?) - patient notes heavy clots with cycles. Discussed that new OCPs should help to decrease bleeding and clotting.  Patient notes understanding     A total of 25 minutes were spent face-to-face with the patient during this encounter and over half of that time involved counseling and coordination of care.    Hildred Laserherry, Brittin Belnap, MD [facility]

## 2017-11-24 ENCOUNTER — Other Ambulatory Visit: Payer: Self-pay

## 2017-11-24 DIAGNOSIS — R109 Unspecified abdominal pain: Secondary | ICD-10-CM

## 2017-11-24 NOTE — Telephone Encounter (Signed)
Refill request for general medication: Dicyclomine 20 mg  Last office visit: 10/26/2017  Last physical exam: 04/11/2017  Follow up: 01/29/2018

## 2017-11-28 MED ORDER — DICYCLOMINE HCL 20 MG PO TABS: 20.0000 mg | ORAL_TABLET | Freq: Four times a day (QID) | ORAL | 1 refills | Status: DC

## 2017-12-01 ENCOUNTER — Encounter: Payer: Self-pay | Admitting: Obstetrics and Gynecology

## 2017-12-11 ENCOUNTER — Other Ambulatory Visit: Payer: Self-pay

## 2017-12-11 ENCOUNTER — Ambulatory Visit: Payer: Medicare Other | Attending: Obstetrics and Gynecology

## 2017-12-11 DIAGNOSIS — M791 Myalgia, unspecified site: Secondary | ICD-10-CM

## 2017-12-11 DIAGNOSIS — R293 Abnormal posture: Secondary | ICD-10-CM | POA: Diagnosis not present

## 2017-12-11 DIAGNOSIS — M629 Disorder of muscle, unspecified: Secondary | ICD-10-CM | POA: Diagnosis not present

## 2017-12-11 DIAGNOSIS — M62838 Other muscle spasm: Secondary | ICD-10-CM

## 2017-12-11 DIAGNOSIS — M6289 Other specified disorders of muscle: Secondary | ICD-10-CM

## 2017-12-11 NOTE — Patient Instructions (Signed)
Stabilization: Diaphragmatic Breathing    Lie with knees bent, feet flat. Place one hand on stomach, other on chest. Breathe deeply through nose, lifting belly hand without any motion of hand on chest. Repeat __10__ times per set. Do _2-3___ sets per session. Do __7__ sessions per week. (once per day)

## 2017-12-11 NOTE — Therapy (Signed)
[facility] MAIN [facility] SERVICES [address] [place], [place], [address] Phone: [phone]   Fax:  [phone]  Physical Therapy Evaluation  Patient Details  Name: Yesenia Wood MRN: [id number] Date of Birth: 1986-09-17 Referring Provider: Hildred Laser   Encounter Date: 12/11/2017  PT End of Session - 12/12/17 1110    Visit Number  1    Number of Visits  10    Date for PT Re-Evaluation  02/19/18    Authorization Type  Medicare    Authorization - Visit Number  1    Authorization - Number of Visits  10    PT Start Time  1030    PT Stop Time  1200    PT Time Calculation (min)  90 min    Activity Tolerance  Patient tolerated treatment well Patient requires verbal notification before any touch    Behavior During Therapy  [facility] for tasks assessed/performed demonstrated mild dicsomfort intermittently at lack of understanding of what was being done.       Past Medical History:  Diagnosis Date  . B12 deficiency   . Bell's palsy    right sided  . Deaf   . 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 Fatigue   . Fatty liver   . Fibromyalgia   . GERD (gastroesophageal reflux disease)   . Headache   . High triglycerides   . Hyperglycemia   . Hypertension   . Moderate asthma   . Obesity   . Paresthesia   . Premature birth     Past Surgical History:  Procedure Laterality Date  . BARTHOLIN GLAND CYST REMOVAL    . COCHLEAR IMPLANT    . COCHLEAR IMPLANT REMOVAL    . LAPAROSCOPY    . MYRINGOTOMY WITH TUBE PLACEMENT      There were no vitals filed for this visit.    Pelvic Floor Physical Therapy Evaluation and Assessment  SCREENING  Falls in last 6 mo: yes, no major injury, self proclaimed clumsy.     Patient's communication preference:   Red Flags:  Have you had any night sweats? Started a few years ago and they are intermittent, 1-2x per week Unexplained weight loss? Has lost  weight but has been trying to lose weight Saddle anesthesia? Unexplained changes in bowel or bladder habits? "fluctuates between constipation and diarrhea but this has been going on for years.   SUBJECTIVE  Patient reports: She has had pain since her first menses. Pain is worse during menses but can occur at any time.   Has nausea that is intermittent that may last for weeks and then resolves spontaneously  Numbness and tingling on the R side, inconclusive tests, MD thinks it is due to fibromyalgia- per pt.  Has back pain that is so great that she is considering getting a cane. It has been going on since she was about 20 and has no specific incident associated with it, gradual decline.  Precautions:  Deaf.  Social/Family/Vocational History:   Unemployed, lives in apartment at parents house.  Pertinent history of: Bell's palsy, dissocial personality disorder, athsma, sleep apnea, sleep disturbance, depression,  fibromyalgia, labrinthine disorder.  Recent Procedures/Tests/Findings:  Ultrasound: retroverted uterus, no significant findings.  Obstetrical History: none  Gynecological History: History of painful periods since onset of menses, recent removal of IUD and return to [place] for [facility], wishes to have tubal ligation  Urinary History: No  Issues with retention or  incontinence  Gastrointestinal History: Fluctuates between constipation and diarrhea.   Sexual activity/pain: Not currently in a relationship but has had pain with deep thrusting in the past.  Location of pain: back pain, hips, vaginal and lower abdomen Current pain:  3/10  Max pain:  8/10 Least pain:  2/10 Nature of pain: deep ache  Patient Goals:    OBJECTIVE  Posture/Observations:  Sitting: cross-legged  Standing: mild exaggeration of lordosis  Palpation/Segmental Motion/Joint Play: Pain with palpation to L adductor Decreased mobility and pain through sacrum, lumbar and thoracic spine with spring  test.  Special tests:   Supine to sit: R Leg long in sitting and lying Leg-Length measurement: no true leg length discrepancy Stork: negative for instability Ober: negative B  Range of Motion/Flexibilty:  Spine: restricted with R sidebending. Patient becomes dizzy with repeated extension (likely due to inner ear dysfunction). Decreased SI mobility on L, L ilium high, R facing sacrum  Hips:   Strength/MMT:  LE MMT  LE MMT Left Right  Hip flex:  (L2) /5 /5  Hip ext: 5/5 5/5  Hip abd: 4+/5 P! 5/5 P!  Hip add: 5/5 P! /5  Hip IR 4/5 4+/5  Hip ER 4/5 5/5   Patient described pain on the inside and outside of of her L thigh with L add, abd, and R abd.  Abdominal:  Palpation: TTP to psoas B, L>R and to pressure through lower abdomen B, L>R.  Laparoscopy scar  Diastasis: no diastasis  Pelvic Floor External Exam: Deferred to appropriate time d/t patient comfort Introitus Appears:  Skin integrity:  Palpation: Cough: Prolapse visible?: Scar mobility:  Internal Vaginal Exam: Deferred to appropriate time d/t patient comfort Strength (PERF):  Symmetry: Palpation: Prolapse:   Treatment this session:  Self Care: Patient educated on structure and function of the pelvic floor, findings of her exam, POC, and diaphragmatic breathing (how to and benefits).  Total time: 90 min.              Objective measurements completed on examination: See above findings.              PT Education - 12/12/17 1109    Education provided  Yes    Education Details  Patient was educated on the structure and function of the pelvic floor in relation to her symptoms, POC, and HEP.    Person(s) Educated  Patient via interpreter    Methods  Explanation;Demonstration;Verbal cues;Handout    Comprehension  Verbalized understanding;Returned demonstration;Verbal cues required;Need further instruction via interpreter       PT Short Term Goals - 12/12/17 1159      PT SHORT TERM GOAL  #1   Title  Patient will demonstrate improved pelvic allignment in standing and supine for improved pelvic position for PFM length/tension relationship.    Time  5    Period  Weeks    Status  New    Target Date  01/15/18      PT SHORT TERM GOAL #2   Title  Patient will undergo Internal/External assessment of the PFM to allow or direct treatment of PFM spasms.    Time  5    Period  Weeks    Status  New    Target Date  01/15/18      PT SHORT TERM GOAL #3   Title  Patient will demonstrate appropriate body mechanics with bending and lifting heavy objects to allow for decreased stress on the pelvic floor and low back.  Time  5    Period  Weeks    Status  New    Target Date  01/15/18        PT Long Term Goals - 12/12/17 1203      PT LONG TERM GOAL #1   Title  Patient will score at or below 8/43 on the Female NIH-CPSI and 5 on the VQ to demonstrate a clinically meaningful decrease in disability and distress due to pelvic floor dysfunction.    Baseline  NIH- CPSI: 18/43, VQ: 10/33     Time  10    Period  Weeks    Status  New    Target Date  02/19/18      PT LONG TERM GOAL #2   Title  Patient will demonstrate ability to use a tool for self TP release to allow for maintenence of spasm release upon D/C.    Time  10    Period  Weeks    Status  New    Target Date  02/19/18      PT LONG TERM GOAL #3   Title  Patient will report a reduction in pain to no greater than 2/10 over the prior week to demonstrate symptom improvement.    Time  10    Period  Weeks    Status  New    Target Date  02/19/18      PT LONG TERM GOAL #4   Title  Patient will demonstrate improved sitting and standing posture to demonstrate learning and decrease stress on the pelvic floor with functional activity.    Time  10    Period  Weeks    Status  New    Target Date  02/19/18             Plan - 12/12/17 1145    Clinical Impression Statement  Patient is a 32 y/o female who presents today with  primary c/o pelvic pain.     History and Personal Factors relevant to plan of care:  Deaf, multiple co-morbidities    Clinical Presentation  Evolving    Clinical Presentation due to:  Patient uncomfortable with performance of internal exam at this time, would prefer to assess external factors first, history of fibromyalgia.     Clinical Decision Making  High    Rehab Potential  Fair    Clinical Impairments Affecting Rehab Potential  Deaf, multiple co-morbidities, fibromyalgia    PT Frequency  1x / week    PT Duration  Other (comment) 10 weeks    PT Treatment/Interventions  ADLs/Self Care Home Management;Aquatic Therapy;Biofeedback;Moist Heat;Traction;Ultrasound;Balance training;Gait training;Functional mobility training;Therapeutic activities;Therapeutic exercise;Neuromuscular re-education;Patient/family education;Orthotic Fit/Training;Scar mobilization;Manual techniques;Manual lymph drainage;Dry needling;Taping    PT Next Visit Plan  manual treatment for L upslip/ abdominal STM and MFR. educate on 3 wal wall stretch and squatty-potty.    PT Home Exercise Plan  diaphragmatic breathing    Consulted and Agree with Plan of Care  Patient       Patient will benefit from skilled therapeutic intervention in order to improve the following deficits and impairments:  Dizziness, Increased fascial restricitons, Impaired sensation, Improper body mechanics, Pain, Decreased coordination, Hypermobility, Postural dysfunction, Increased muscle spasms, Impaired tone, Decreased balance, Difficulty walking  Visit Diagnosis: Myalgia  Other muscle spasm  Abnormal posture  Muscular imbalance     Problem List Patient Active Problem List   Diagnosis Date Noted  . OSA (obstructive sleep apnea) 10/10/2016  . Other insomnia 10/10/2016  . Dissocial personality disorder (  HCC) 06/07/2016  . Sleep disturbance 05/10/2016  . Chronic migraine without aura 03/29/2016  . Costochondral chest pain 12/28/2015  . Pain  in the chest 06/09/2015  . Palpitations 06/09/2015  . Deaf 05/16/2015  . B12 deficiency 05/16/2015  . Cochlear implant status 05/16/2015  . Fatty infiltration of liver 05/16/2015  . Fibromyalgia syndrome 05/16/2015  . Gastro-esophageal reflux disease without esophagitis 05/16/2015  . Generalized headache 05/16/2015  . Right-sided Bell's palsy 05/16/2015  . H/O: depression 05/16/2015  . Hypertriglyceridemia 05/16/2015  . Blood glucose elevated 05/16/2015  . Irregular menstrual cycle 05/16/2015  . Overweight 05/16/2015  . Paresthesia 05/16/2015  . Disorder of labyrinth 05/16/2015  . Dyspnea 10/07/2014  . Moderate persistent intrinsic asthma without status asthmaticus without complication 10/07/2014   Cleophus Molt DPT, ATC Cleophus Molt 12/12/2017, 9:52 PM  [facility] MAIN [facility] SERVICES [address] [place], [place], [address] Phone: [phone]   Fax:  [phone]  Name: Yesenia Wood MRN: [id number] Date of Birth: 08-23-1986

## 2017-12-15 ENCOUNTER — Other Ambulatory Visit: Payer: Self-pay

## 2017-12-15 DIAGNOSIS — R109 Unspecified abdominal pain: Secondary | ICD-10-CM

## 2017-12-15 MED ORDER — DICYCLOMINE HCL 20 MG PO TABS
20.0000 mg | ORAL_TABLET | Freq: Four times a day (QID) | ORAL | 1 refills | Status: DC
Start: 2017-12-15 — End: 2018-04-16

## 2017-12-15 NOTE — Telephone Encounter (Signed)
[facility] is requesting patient have a 90 day prescription for her Dicyclomine instead of a 30 day supply please. Requesting 270 pills since it is every 6 hours as needed on directions.

## 2017-12-18 ENCOUNTER — Telehealth: Payer: Self-pay | Admitting: Cardiovascular Disease

## 2017-12-18 ENCOUNTER — Ambulatory Visit: Payer: Medicare Other

## 2017-12-18 DIAGNOSIS — M62838 Other muscle spasm: Secondary | ICD-10-CM

## 2017-12-18 DIAGNOSIS — M629 Disorder of muscle, unspecified: Secondary | ICD-10-CM | POA: Diagnosis not present

## 2017-12-18 DIAGNOSIS — M791 Myalgia, unspecified site: Secondary | ICD-10-CM

## 2017-12-18 DIAGNOSIS — R293 Abnormal posture: Secondary | ICD-10-CM

## 2017-12-18 DIAGNOSIS — M6289 Other specified disorders of muscle: Secondary | ICD-10-CM

## 2017-12-18 NOTE — Telephone Encounter (Signed)
Sent MyChart response to patient. Offered Jan 22, 2pm appt.  Awaiting response.

## 2017-12-18 NOTE — Therapy (Signed)
[place] MAIN [facility] SERVICES [address] [place], [place], [address] Phone: [phone]   Fax:  [phone]  Physical Therapy Treatment  Patient Details  Name: Yesenia Wood MRN: [id number] Date of Birth: June 12, 1986 Referring Provider: Rubie Maid   Encounter Date: 12/18/2017  PT End of Session - 12/18/17 1452    Visit Number  2    Number of Visits  10    Date for PT Re-Evaluation  02/19/18    Authorization Type  Medicare    Authorization - Visit Number  2    Authorization - Number of Visits  10    PT Start Time  1320    PT Stop Time  1430    PT Time Calculation (min)  70 min    Activity Tolerance  Patient tolerated treatment well    Behavior During Therapy  [facility] for tasks assessed/performed       Past Medical History:  Diagnosis Date  . B12 deficiency   . Bell's palsy    right sided  . Deaf   . Dissocial personality disorder ([place])    three different personalities documented by PCP  . Fatigue   . Fatty liver   . Fibromyalgia   . GERD (gastroesophageal reflux disease)   . Headache   . High triglycerides   . Hyperglycemia   . Hypertension   . Moderate asthma   . Obesity   . Paresthesia   . Premature birth     Past Surgical History:  Procedure Laterality Date  . BARTHOLIN GLAND CYST REMOVAL    . COCHLEAR IMPLANT    . COCHLEAR IMPLANT REMOVAL    . LAPAROSCOPY    . MYRINGOTOMY WITH TUBE PLACEMENT      There were no vitals filed for this visit.    Pelvic Floor Physical Therapy Treatment Note  SCREENING  Changes in medications, allergies, or medical history?: no     SUBJECTIVE  Patient reports: IS doing ok, is cold, fibromyalgia is bothering her. Feels uncomfortable when she tries to do deep breathing.  Pain update:  Location of pain: mostly in elbow and knees currently. From chest to knees over last week. Current pain:  5/10  Max pain:  7/10 Least pain:  3/10 Nature of pain: general ache.  Patient  Goals: Be able to walk comfortably and do activities around the house without pain.   OBJECTIVE  INTERVENTIONS THIS SESSION: Manual: TP release to QL, obliques, psoas, and Piriformis on L followed by grade 3-4 sacral mobs, long axis pull, and MET for L upslip correction and improved alignment.  Self-care: educated on pain science and pain management. Discussed potential use of aquatic therapy but this may not be possible due to patient's asthma. Discussed role of tens-units, acupuncture, manual therapy and graded exercise with positive reinforcement options and discussed the mechanism of action. Of each NM-Re-Ed: educated on and practiced TA recruitment in quadruped for improved diaphragmatic breathing as well as TA contraction.   Total time: 70 min.                         PT Education - 12/18/17 1449    Education provided  Yes    Education Details  See Pt. instructions and Interventions this session.    Person(s) Educated  Patient    Methods  Explanation via translator    Comprehension  Verbalized understanding via Optometrist       PT Short Term  Goals - 12/12/17 1159      PT SHORT TERM GOAL #1   Title  Patient will demonstrate improved pelvic allignment in standing and supine for improved pelvic position for PFM length/tension relationship.    Time  5    Period  Weeks    Status  New    Target Date  01/15/18      PT SHORT TERM GOAL #2   Title  Patient will undergo Internal/External assessment of the PFM to allow or direct treatment of PFM spasms.    Time  5    Period  Weeks    Status  New    Target Date  01/15/18      PT SHORT TERM GOAL #3   Title  Patient will demonstrate appropriate body mechanics with bending and lifting heavy objects to allow for decreased stress on the pelvic floor and low back.     Time  5    Period  Weeks    Status  New    Target Date  01/15/18        PT Long Term Goals - 12/12/17 1203      PT LONG TERM GOAL #1    Title  Patient will score at or below 8/43 on the Female NIH-CPSI and 5 on the VQ to demonstrate a clinically meaningful decrease in disability and distress due to pelvic floor dysfunction.    Baseline  NIH- CPSI: 18/43, VQ: 10/33     Time  10    Period  Weeks    Status  New    Target Date  02/19/18      PT LONG TERM GOAL #2   Title  Patient will demonstrate ability to use a tool for self TP release to allow for maintenence of spasm release upon D/C.    Time  10    Period  Weeks    Status  New    Target Date  02/19/18      PT LONG TERM GOAL #3   Title  Patient will report a reduction in pain to no greater than 2/10 over the prior week to demonstrate symptom improvement.    Time  10    Period  Weeks    Status  New    Target Date  02/19/18      PT LONG TERM GOAL #4   Title  Patient will demonstrate improved sitting and standing posture to demonstrate learning and decrease stress on the pelvic floor with functional activity.    Time  10    Period  Weeks    Status  New    Target Date  02/19/18            Plan - 12/18/17 1453    Clinical Impression Statement  Patient responded well to treatment today, demonstrating improved pelvic alignment at the end of session. Demonstrated understanding of all education provided and willingness to do what it takes to resolve her pain. She is interrested in the potential use of a tens unit for pain modulation.    History and Personal Factors relevant to plan of care:  deaf, multiple co-morbidities    Clinical Presentation  Evolving    Clinical Decision Making  High    Rehab Potential  Fair    Clinical Impairments Affecting Rehab Potential  Deaf, multiple co-morbidities, fibromyalgia    PT Frequency  1x / week    PT Duration  Other (comment) 10 weeks    PT Treatment/Interventions  ADLs/Self  Care Home Management;Aquatic Therapy;Biofeedback;Moist Heat;Traction;Ultrasound;Balance training;Gait training;Functional mobility training;Therapeutic  activities;Therapeutic exercise;Neuromuscular re-education;Patient/family education;Orthotic Fit/Training;Scar mobilization;Manual techniques;Manual lymph drainage;Dry needling;Taping    PT Next Visit Plan  tens unit trial, manual to back, educate on 3 way wall stretch and squatty-potty.    PT Home Exercise Plan  diaphragmatic breathing, TA in quadruped    Consulted and Agree with Plan of Care  Patient       Patient will benefit from skilled therapeutic intervention in order to improve the following deficits and impairments:  Dizziness, Increased fascial restricitons, Impaired sensation, Improper body mechanics, Pain, Decreased coordination, Hypermobility, Postural dysfunction, Increased muscle spasms, Impaired tone, Decreased balance, Difficulty walking  Visit Diagnosis: Myalgia  Other muscle spasm  Abnormal posture  Muscular imbalance     Problem List Patient Active Problem List   Diagnosis Date Noted  . OSA (obstructive sleep apnea) 10/10/2016  . Other insomnia 10/10/2016  . Dissocial personality disorder ([place]) 06/07/2016  . Sleep disturbance 05/10/2016  . Chronic migraine without aura 03/29/2016  . Costochondral chest pain 12/28/2015  . Pain in the chest 06/09/2015  . Palpitations 06/09/2015  . Deaf 05/16/2015  . B12 deficiency 05/16/2015  . Cochlear implant status 05/16/2015  . Fatty infiltration of liver 05/16/2015  . Fibromyalgia syndrome 05/16/2015  . Gastro-esophageal reflux disease without esophagitis 05/16/2015  . Generalized headache 05/16/2015  . Right-sided Bell's palsy 05/16/2015  . H/O: depression 05/16/2015  . Hypertriglyceridemia 05/16/2015  . Blood glucose elevated 05/16/2015  . Irregular menstrual cycle 05/16/2015  . Overweight 05/16/2015  . Paresthesia 05/16/2015  . Disorder of labyrinth 05/16/2015  . Dyspnea 10/07/2014  . Moderate persistent intrinsic asthma without status asthmaticus without complication 61/51/8343   Willa Rough DPT,  ATC Willa Rough 12/18/2017, 3:05 PM  [place] MAIN [facility] SERVICES [address] [place], [place], [address] Phone: [phone]   Fax:  [phone]  Name: Yesenia Wood MRN: [id number] Date of Birth: March 08, 1986

## 2017-12-18 NOTE — Telephone Encounter (Signed)
Per Mychart Msg   Pulse under 60 a minute sometimes, and spike up in high 90s even on quiet times.  Low impact exercise, rate 90 to 130, I get tired & dizzy more easily  Feel like heart pounding harder, cause dizzy & balance gets little woozy, even during rest time.  blood pressure seem to be okay  Get dizzy easily when pulse drops or increase quickly. Also chest hurts frequent again & its bothersome.   Very nauseous & dizzy, feel like faint & puking, when any these happened    Will like see what can do. Thanks     Scheduled Friday 1/25 at 1140 am

## 2017-12-18 NOTE — Patient Instructions (Signed)
   Breathe in, let belly relax down toward the floor and then breathe out, pulling the lower belly in toward the backbone.   Repeat this _10__ times _3__ times per day

## 2017-12-22 ENCOUNTER — Encounter: Payer: Self-pay | Admitting: Cardiovascular Disease

## 2017-12-22 ENCOUNTER — Ambulatory Visit (INDEPENDENT_AMBULATORY_CARE_PROVIDER_SITE_OTHER): Payer: Medicare Other | Admitting: Cardiovascular Disease

## 2017-12-22 ENCOUNTER — Ambulatory Visit (INDEPENDENT_AMBULATORY_CARE_PROVIDER_SITE_OTHER): Payer: Medicare Other

## 2017-12-22 VITALS — BP 104/70 | HR 80 | Ht 63.0 in | Wt 153.5 lb

## 2017-12-22 DIAGNOSIS — R002 Palpitations: Secondary | ICD-10-CM

## 2017-12-22 DIAGNOSIS — R000 Tachycardia, unspecified: Secondary | ICD-10-CM

## 2017-12-22 NOTE — Progress Notes (Signed)
Cardiology Office Note   Date:  12/22/2017   ID:  Yesenia Wood, DOB 01/12/1986, MRN [id number]  PCP:  Alba CorySowles, Krichna, MD  Cardiologist:   Lorine BearsMuhammad Daishia Fetterly, MD   Chief Complaint  Patient presents with  . other    Pt. c/o dizziness, rapid irreg. heart beats and shortness of breath. Meds reviewed by the pt. verbally.       History of Present Illness: Yesenia Wood is a 32 y.o. female who presents for a follow-up visit regarding palpitations thought to be due to inappropriate sinus tachycardia. She is accompanied by a sign language interpreter . 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  [place]. She underwent treadmill stress testing 06/23/2015 that was normal. However, her HR and BP were elevated at peak exercise, thus she was started on extended release Cardizem. She also wore a 48-hour Holter monitor given her palpitations that showed sinus tachycardia.  The dose of diltiazem was decreased to 180 mg once daily in September due to borderline bradycardia in the 50s.  She reports significant fluctuation in heart rate ranging from 55-90 bpm which happens quickly.  This fluctuation gets her dizzy with an ability to perform her exercise effectively.  She feels nauseous also.  There has been no syncope.  Occasional chest discomfort with.  Past Medical History:  Diagnosis Date  . B12 deficiency   . Bell's palsy    right sided  . Chronic migraine   . Deaf   . 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 Fatigue   . Fatty liver   . Fibromyalgia   . GERD (gastroesophageal reflux disease)   . Headache   . High triglycerides   . Hyperglycemia   . Hypertension   . Moderate asthma   . Obesity   . Paresthesia   . Premature birth     Past Surgical History:  Procedure  Laterality Date  . BARTHOLIN GLAND CYST REMOVAL    . COCHLEAR IMPLANT    . COCHLEAR IMPLANT REMOVAL    . LAPAROSCOPY    . MYRINGOTOMY WITH TUBE PLACEMENT       Current Outpatient Medications  Medication Sig Dispense Refill  . dicyclomine (BENTYL) 20 MG tablet Take 1 tablet (20 mg total) by mouth every 6 (six) hours. 270 tablet 1  . diltiazem (CARDIZEM CD) 180 MG 24 hr capsule Take 1 capsule (180 mg total) daily by mouth. 90 capsule 3  . levonorgestrel-ethinyl estradiol (SEASONALE,INTROVALE,JOLESSA) 0.15-0.03 MG tablet Take 1 tablet by mouth daily. 1 Package 4  . pantoprazole (PROTONIX) 20 MG tablet Take 1 tablet (20 mg total) by mouth daily. 90 tablet 0  . vitamin B-12 (CYANOCOBALAMIN) 1000 MCG tablet Take 1,000 mcg by mouth daily.     No current facility-administered medications for this visit.     Allergies:   Budesonide-formoterol fumarate; Astelin [azelastine]; Gabapentin; Nortriptyline; Diclofenac; Duloxetine hcl; Montelukast sodium; Mupirocin; Naproxen; Naproxen sodium; and Sulfa antibiotics    Social History:  The patient  reports that she quit smoking about 11 years ago. Her smoking use included cigarettes. She has a 2.00 pack-year smoking history. she has never used smokeless tobacco. She reports that she drinks alcohol. She reports that she does not use drugs.   Family History:  The patient's She was adopted. Family history is unknown by  patient.    ROS:  Please see the history of present illness.   Otherwise, review of systems are positive for none.   All other systems are reviewed and negative.    PHYSICAL EXAM: VS:  BP 104/70 (BP Location: Left Arm, Patient Position: Sitting, Cuff Size: Normal)   Pulse 80   Ht 5\' 3"  (1.6 m)   Wt 153 lb 8 oz (69.6 kg)   BMI 27.19 kg/m  , BMI Body mass index is 27.19 kg/m. GEN: Well nourished, well developed, in no acute distress  HEENT: normal  Neck: no JVD, carotid bruits, or masses Cardiac: RRR; no murmurs, rubs, or gallops,no  edema  Respiratory:  clear to auscultation bilaterally, normal work of breathing GI: soft, nontender, nondistended, + BS MS: no deformity or atrophy  Skin: warm and dry, no rash Neuro:  Strength and sensation are intact Psych: euthymic mood, full affect   EKG:  EKG is ordered today. The ekg ordered today demonstrates normal sinus rhythm with no significant ST or T wave changes.   Recent Labs: 10/05/2017: BUN 8; Creat 0.74; Hemoglobin 14.1; Platelets 323; Potassium 4.0; Sodium 138    Lipid Panel    Component Value Date/Time   CHOL 192 10/10/2016 1235   CHOL 192 10/29/2015 1050   TRIG 101 10/10/2016 1235   HDL 39 (L) 10/10/2016 1235   HDL 43 10/29/2015 1050   CHOLHDL 4.9 10/10/2016 1235   VLDL 20 10/10/2016 1235   LDLCALC 133 (H) 10/10/2016 1235   LDLCALC 123 (H) 10/29/2015 1050      Wt Readings from Last 3 Encounters:  12/22/17 153 lb 8 oz (69.6 kg)  11/20/17 150 lb 4.8 oz (68.2 kg)  10/26/17 151 lb 11.2 oz (68.8 kg)       ASSESSMENT AND PLAN:  1.  Inappropriate sinus tachycardia: Recent worsening of palpitations mostly with fluctuation in heart rate which seems to be bothering her the most.  However, resting heart rate looks good and there was no significant change with changing positions.  I am going to obtain a 48-hour Holter monitor to ensure no other associated arrhythmia.  Continue same dose of diltiazem for now.  2. Shortness of breath:  Likely related to her lung disease.  This continues to be a big issue to the patient.   Disposition:   FU with me in 12 months  Signed,   Lorine Bears, MD  12/22/2017 1:32 PM    [facility]

## 2017-12-22 NOTE — Patient Instructions (Addendum)
Medication Instructions:  Your physician recommends that you continue on your current medications as directed. Please refer to the Current Medication list given to you today.   Labwork: none  Testing/Procedures: Your physician has recommended that you wear a holter monitor. Holter monitors are medical devices that record the heart's electrical activity. Doctors most often use these monitors to diagnose arrhythmias. Arrhythmias are problems with the speed or rhythm of the heartbeat. The monitor is a small, portable device. You can wear one while you do your normal daily activities. This is usually used to diagnose what is causing palpitations/syncope (passing out).    Follow-Up: Your physician wants you to follow-up in: 6 months with Dr. Kirke CorinArida.  You will receive a reminder letter in the mail two months in advance. If you don't receive a letter, please call our office to schedule the follow-up appointment.   Any Other Special Instructions Will Be Listed Below (If Applicable).     If you need a refill on your cardiac medications before your next appointment, please call your pharmacy.   Holter Monitoring A Holter monitor is a small device that is used to detect abnormal heart rhythms. It clips to your clothing and is connected by wires to flat, sticky disks (electrodes) that attach to your chest. It is worn continuously for 24-48 hours. Follow these instructions at home:  Wear your Holter monitor at all times, even while exercising and sleeping, for as long as directed by your health care provider.  Make sure that the Holter monitor is safely clipped to your clothing or close to your body as recommended by your health care provider.  Do not get the monitor or wires wet.  Do not put body lotion or moisturizer on your chest.  Keep your skin clean.  Keep a diary of your daily activities, such as walking and doing chores. If you feel that your heartbeat is abnormal or that your heart  is fluttering or skipping a beat: ? Record what you are doing when it happens. ? Record what time of day the symptoms occur.  Return your Holter monitor as directed by your health care provider.  Keep all follow-up visits as directed by your health care provider. This is important. Get help right away if:  You feel lightheaded or you faint.  You have trouble breathing.  You feel pain in your chest, upper arm, or jaw.  You feel sick to your stomach and your skin is pale, cool, or damp.  You heartbeat feels unusual or abnormal. This information is not intended to replace advice given to you by your health care provider. Make sure you discuss any questions you have with your health care provider. Document Released: 08/12/2004 Document Revised: 04/21/2016 Document Reviewed: 06/23/2014 Elsevier Interactive Patient Education  [employer]2018 Elsevier Inc.

## 2017-12-24 DIAGNOSIS — R002 Palpitations: Secondary | ICD-10-CM

## 2017-12-25 ENCOUNTER — Ambulatory Visit: Payer: Medicare Other

## 2017-12-25 DIAGNOSIS — M791 Myalgia, unspecified site: Secondary | ICD-10-CM | POA: Diagnosis not present

## 2017-12-25 DIAGNOSIS — M6289 Other specified disorders of muscle: Secondary | ICD-10-CM

## 2017-12-25 DIAGNOSIS — M62838 Other muscle spasm: Secondary | ICD-10-CM

## 2017-12-25 DIAGNOSIS — R293 Abnormal posture: Secondary | ICD-10-CM

## 2017-12-25 DIAGNOSIS — M629 Disorder of muscle, unspecified: Secondary | ICD-10-CM

## 2017-12-25 NOTE — Therapy (Signed)
[place] [facility] MAIN [facility] SERVICES [address] [place], [place], [address] Phone: [phone]   Fax:  [phone]  Physical Therapy Treatment  Patient Details  Name: Yesenia Wood MRN: [id number] Date of Birth: 10/03/1986 Referring Provider: Hildred Laser   Encounter Date: 12/25/2017  PT End of Session - 12/26/17 0806    Visit Number  3    Number of Visits  10    Date for PT Re-Evaluation  02/19/18    Authorization Type  Medicare    Authorization - Visit Number  3    Authorization - Number of Visits  10    PT Start Time  1030    PT Stop Time  1145    PT Time Calculation (min)  75 min    Activity Tolerance  Patient tolerated treatment well;Other (comment) Patient became nauseaus with E-stim    Behavior During Therapy  [facility] for tasks assessed/performed       Past Medical History:  Diagnosis Date  . B12 deficiency   . Bell's palsy    right sided  . Chronic migraine   . Deaf   . 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 Fatigue   . Fatty liver   . Fibromyalgia   . GERD (gastroesophageal reflux disease)   . Headache   . High triglycerides   . Hyperglycemia   . Hypertension   . Moderate asthma   . Obesity   . Paresthesia   . Premature birth     Past Surgical History:  Procedure Laterality Date  . BARTHOLIN GLAND CYST REMOVAL    . COCHLEAR IMPLANT    . COCHLEAR IMPLANT REMOVAL    . LAPAROSCOPY    . MYRINGOTOMY WITH TUBE PLACEMENT      There were no vitals filed for this visit.    Pelvic Floor Physical Therapy Treatment Note  SCREENING  Changes in medications, allergies, or medical history?: no     SUBJECTIVE  Patient reports: Has been able to do some of her exercises at home including the one in quadruped and the one lying down but needs to do them on the bed. Has increased discomfort when doing the exercises but it does not persist, hard to tell if it gets better  than baseline or returns to it.  Precautions:  Deaf  Pain update:  Location of pain: full body, fibromyalgia Current pain:  6/10  Max pain:  8/10 Least pain:  5/10 Nature of pain:   Patient Goals: Be able to walk comfortably and do activities around the house without pain.   OBJECTIVE   INTERVENTIONS THIS SESSION: Therex: Educated patient on multiple variations of stretches for adductors and  Piriformis to find one that was accessible for her at home, practiced in the clinic for form of the version she chose as the best of each to check form and for breathing. Educated on and practiced child's pose with diaphragmatic breathing with pillow in knee fold variation to increase low back fascial length and for PFM lengthening/relaxation to decrease pain and constipation. NM Re-Ed: educated on the mechanism of action of TENS and attempted to find a pad placement that would allow her to feel stimulation without increased pain. Patient reported increased nausea with all arrangements but she is having a "bad day" with her fibromyalgia in general. Best placement is a small pad over the sacral roots with a larger pad over ipsilateral lumbar extensors.  Total time: 75 min.                        PT Education - 12/26/17 0805    Education provided  Yes    Education Details  See Pt. Instructions and Interventions this session    Person(s) Educated  Patient    Methods  Explanation;Handout;Demonstration;Verbal cues via translator    Comprehension  Verbalized understanding;Returned demonstration;Verbal cues required via translator       PT Short Term Goals - 12/12/17 1159      PT SHORT TERM GOAL #1   Title  Patient will demonstrate improved pelvic allignment in standing and supine for improved pelvic position for PFM length/tension relationship.    Time  5    Period  Weeks    Status  New    Target Date  01/15/18      PT SHORT TERM GOAL #2   Title  Patient will  undergo Internal/External assessment of the PFM to allow or direct treatment of PFM spasms.    Time  5    Period  Weeks    Status  New    Target Date  01/15/18      PT SHORT TERM GOAL #3   Title  Patient will demonstrate appropriate body mechanics with bending and lifting heavy objects to allow for decreased stress on the pelvic floor and low back.     Time  5    Period  Weeks    Status  New    Target Date  01/15/18        PT Long Term Goals - 12/12/17 1203      PT LONG TERM GOAL #1   Title  Patient will score at or below 8/43 on the Female NIH-CPSI and 5 on the VQ to demonstrate a clinically meaningful decrease in disability and distress due to pelvic floor dysfunction.    Baseline  NIH- CPSI: 18/43, VQ: 10/33     Time  10    Period  Weeks    Status  New    Target Date  02/19/18      PT LONG TERM GOAL #2   Title  Patient will demonstrate ability to use a tool for self TP release to allow for maintenence of spasm release upon D/C.    Time  10    Period  Weeks    Status  New    Target Date  02/19/18      PT LONG TERM GOAL #3   Title  Patient will report a reduction in pain to no greater than 2/10 over the prior week to demonstrate symptom improvement.    Time  10    Period  Weeks    Status  New    Target Date  02/19/18      PT LONG TERM GOAL #4   Title  Patient will demonstrate improved sitting and standing posture to demonstrate learning and decrease stress on the pelvic floor with functional activity.    Time  10    Period  Weeks    Status  New    Target Date  02/19/18            Plan - 12/26/17 0808    Clinical Impression Statement  Patient responded well to education and practice of stretches and was very open/excited about the idea of using TENS for pain modulation but did not tolerate the TENS well with increased nausea regardless of  settings or pad placement. Patient having a generally "bad" day regarding her fibromyalgia today.    History and Personal  Factors relevant to plan of care:  deaf, multiple co-morbidities    Clinical Presentation  Unstable    Clinical Presentation due to:  Patient continues to be uncomfortable with the idea of internal exam, complex comorbidities making precise diagnosis difficult.    Clinical Decision Making  High    Rehab Potential  Fair    Clinical Impairments Affecting Rehab Potential  Deaf, multiple co-morbidities, fibromyalgia    PT Frequency  1x / week    PT Duration  Other (comment) 10 weeks    PT Treatment/Interventions  ADLs/Self Care Home Management;Aquatic Therapy;Biofeedback;Moist Heat;Traction;Ultrasound;Balance training;Gait training;Functional mobility training;Therapeutic activities;Therapeutic exercise;Neuromuscular re-education;Patient/family education;Orthotic Fit/Training;Scar mobilization;Manual techniques;Manual lymph drainage;Dry needling;Taping    PT Next Visit Plan  MFR and STM to adductors and lower abdomen. Body mechanics with bending/lifting.    PT Home Exercise Plan  diaphragmatic breathing, TA in quadruped, child's pose, adductor stretch, piriformis stretch, squatty potty     Consulted and Agree with Plan of Care  Patient       Patient will benefit from skilled therapeutic intervention in order to improve the following deficits and impairments:  Dizziness, Increased fascial restricitons, Impaired sensation, Improper body mechanics, Pain, Decreased coordination, Hypermobility, Postural dysfunction, Increased muscle spasms, Impaired tone, Decreased balance, Difficulty walking  Visit Diagnosis: Myalgia  Other muscle spasm  Abnormal posture  Muscular imbalance     Problem List Patient Active Problem List   Diagnosis Date Noted  . OSA (obstructive sleep apnea) 10/10/2016  . Other insomnia 10/10/2016  . Dissocial personality disorder (HCC) 06/07/2016  . Sleep disturbance 05/10/2016  . Chronic migraine without aura 03/29/2016  . Costochondral chest pain 12/28/2015  . Pain in  the chest 06/09/2015  . Palpitations 06/09/2015  . Deaf 05/16/2015  . B12 deficiency 05/16/2015  . Cochlear implant status 05/16/2015  . Fatty infiltration of liver 05/16/2015  . Fibromyalgia syndrome 05/16/2015  . Gastro-esophageal reflux disease without esophagitis 05/16/2015  . Generalized headache 05/16/2015  . Right-sided Bell's palsy 05/16/2015  . H/O: depression 05/16/2015  . Hypertriglyceridemia 05/16/2015  . Blood glucose elevated 05/16/2015  . Irregular menstrual cycle 05/16/2015  . Overweight 05/16/2015  . Paresthesia 05/16/2015  . Disorder of labyrinth 05/16/2015  . Dyspnea 10/07/2014  . Moderate persistent intrinsic asthma without status asthmaticus without complication 10/07/2014   Cleophus Molt DPT, ATC Cleophus Molt 12/26/2017, 8:17 AM  [place] [facility] MAIN [facility] SERVICES [address] [place], [place], [address] Phone: [phone]   Fax:  [phone]  Name: Yesenia Wood MRN: [id number] Date of Birth: Sep 03, 1986

## 2017-12-25 NOTE — Patient Instructions (Signed)
    Child's Pose    Sit in knee-chest position and reach arms forward. Separate knees for comfort. Hold position for _5__ breaths. Repeat _3_ times. Do _1__ times per day.

## 2017-12-29 ENCOUNTER — Ambulatory Visit
Admission: RE | Admit: 2017-12-29 | Discharge: 2017-12-29 | Disposition: A | Discharge status: Home / Self Care | Payer: Medicare Other | Source: Ambulatory Visit | Attending: Cardiovascular Disease | Admitting: Cardiovascular Disease

## 2017-12-29 DIAGNOSIS — R000 Tachycardia, unspecified: Secondary | ICD-10-CM | POA: Diagnosis not present

## 2018-01-05 NOTE — Telephone Encounter (Signed)
Milta DeitersHi Dru,   Dr. Nicholos Johnsamachandran has an appointment available on Tuesday at 1145 am to see you for a follow up visit.  Are you available?  Thanks,  [facility] Scheduling

## 2018-01-08 ENCOUNTER — Ambulatory Visit: Payer: Medicare Other | Attending: Obstetrics and Gynecology

## 2018-01-08 DIAGNOSIS — M62838 Other muscle spasm: Secondary | ICD-10-CM | POA: Insufficient documentation

## 2018-01-08 DIAGNOSIS — M629 Disorder of muscle, unspecified: Secondary | ICD-10-CM | POA: Insufficient documentation

## 2018-01-08 DIAGNOSIS — M6289 Other specified disorders of muscle: Secondary | ICD-10-CM

## 2018-01-08 DIAGNOSIS — M791 Myalgia, unspecified site: Secondary | ICD-10-CM | POA: Diagnosis not present

## 2018-01-08 DIAGNOSIS — R293 Abnormal posture: Secondary | ICD-10-CM

## 2018-01-08 NOTE — Progress Notes (Signed)
[facility]     Assessment and Plan:  Insomnia  --This patient has a long-standing history of insomnia, which goes back over 15 years. She appears to have some psychological stressors from that time which may be contributory. -Sleep App shows that she is getting 6 hours of sleep per night, which appears to be improved.  Asthma, likely allergic in etiology --Was receiving allergy shots at ENT office, but stopped due to making migraines worse  --Patient has been tried on multiple antihistamines and inhalers in the past, she has had numerous allergic reactions to these medications, therefore we have stopped them.  We discussed today that she does not appear to be a candidate for further inhalers or antihistamines given her numerous allergic reactions, therefore we will treat any exacerbations that may occur, but not continue routine inhaled or anti-allergy medications. - Given our difficulty in treating her asthma symptoms, I offered her a referral to a tertiary care center, as well as a referral back to allergy to see if she may be a candidate for any other therapies, however she declines. --IgE= 17, Abs eosinophil = 0.1 >>both normal --Alpha-1 negative.   Upper airway resistance syndrome --this may become sleep apnea at some point in the future, and she could be retested at that time. However, at this time does not appear that this is playing a major role in her overall sleep issues. --She should avoid sleeping in the supine position.  --Weight loss would be beneficial for this.   Excessive daytime sleepiness.  --Due to inadequate sleep time, secondary to insomnia.     Return if symptoms worsen.     Date: 01/08/2018  MRN# [id number] Yesenia Wood 11/19/1986   Yesenia Wood is a 32 y.o. old female seen in follow up for chief complaint of  Chief Complaint  Patient presents with  . Asthma    Pt states breathing unchanged. She had d/c Spiriva Resp and  Allegra. She has sob/cough.     HPI:    She is deaf and is present with interpreter today for asthma and several years of insomnia. She continues to have insomnia, her phone app continues to show an avg of 6 hours.  She is no longer taking ambien or any other sleeping medications at bedtime. She does not nap during the day. She notes that she is still tired during the day.   She continues some breathing difficulty with activity, at last visit she was given a sample of spiriva respimat and asked to call back if it was helping for a prescription, she was also asked to start allegra.  She tried spiriva but it did not help, and it made her worse. Allegra, also made her feel worse, which gave her rash and throat swelling.  he has had an allergic reaction to proair, where she could not breathe at all and had to go to ER.  She has a sensation of "breathing through a straw", she has seen ENT and had a scope done and was told that her throat was normal, she continues to see them once per year.   She still has the dog in bed with her as she can not sleep allergic. She continues to have a stuffy nose.   [place], that she is around. She had been started on allergy shots for about 6 months, but it gave her severe migraines, therefore she stopped it about 6 months ago.  She did not yet notice a difference in terms of her breathing.  Previous testing. **Sat on 12/26/16 at rest on RA was 98% and HR of 75, after walking 200 feet sat was 98% and HR 97; no dyspnea.   **CXR films 04/11/17; unremarkable lungs.   **sleep study  06/17/16; AHI was 0.8; The patient had elevated RERA in the supine position. Sleep efficiency was only 40%, sleep latency was 116 minutes  **Echocardiogram 03/25/16; normal.   Medication:   Outpatient Encounter Medications as of 01/09/2018  Medication Sig  . dicyclomine (BENTYL) 20 MG tablet Take 1 tablet (20 mg total) by mouth every 6 (six)  hours.  Marland Kitchen diltiazem (CARDIZEM CD) 180 MG 24 hr capsule Take 1 capsule (180 mg total) daily by mouth.  . levonorgestrel-ethinyl estradiol (SEASONALE,INTROVALE,JOLESSA) 0.15-0.03 MG tablet Take 1 tablet by mouth daily.  . pantoprazole (PROTONIX) 20 MG tablet Take 1 tablet (20 mg total) by mouth daily.  . vitamin B-12 (CYANOCOBALAMIN) 1000 MCG tablet Take 1,000 mcg by mouth daily.   No facility-administered encounter medications on file as of 01/09/2018.      Allergies:  Budesonide-formoterol fumarate; Astelin [azelastine]; Gabapentin; Nortriptyline; Diclofenac; Duloxetine hcl; Montelukast sodium; Mupirocin; Naproxen; Naproxen sodium; and Sulfa antibiotics  Review of Systems: Gen:  Denies  fever, sweats. HEENT: Denies blurred vision. Cvc:  No dizziness, chest pain or heaviness Resp:   Denies cough or sputum porduction. Gi: Denies swallowing difficulty, stomach pain.incontinence Gu:  Denies bladder incontinence, burning urine Ext:   No Joint pain, stiffness. Skin: No skin rash, easy bruising. Endoc:  No polyuria, polydipsia. Psych: No depression, insomnia. Other:  All other systems were reviewed and found to be negative other than what is mentioned in the HPI.     Physical Examination:   VS: BP 114/68 (BP Location: Right Arm, Cuff Size: Normal)   Pulse 83   Resp 16   Ht 5\' 3"  (1.6 m)   Wt 154 lb (69.9 kg)   SpO2 98%   BMI 27.28 kg/m   General Appearance: No distress  Neuro:without focal findings,  speech normal,  HEENT: PERRLA, EOM intact. Mallampati 2 Pulmonary: normal breath sounds, No wheezing.   CardiovascularNormal S1,S2.  No m/r/g.   Abdomen: Benign, Soft, non-tender. Renal:  No costovertebral tenderness  GU:  Not performed at this time. Endoc: No evident thyromegaly, no signs of acromegaly. Skin:   warm, no rash. Extremities: normal, no cyanosis, clubbing.   LABORATORY PANEL:   CBC No results for input(s): WBC, HGB, HCT, PLT in the last 168  hours. ------------------------------------------------------------------------------------------------------------------  Chemistries  No results for input(s): NA, K, CL, CO2, GLUCOSE, BUN, CREATININE, CALCIUM, MG, AST, ALT, ALKPHOS, BILITOT in the last 168 hours.  Invalid input(s): GFRCGP ------------------------------------------------------------------------------------------------------------------  Cardiac Enzymes No results for input(s): TROPONINI in the last 168 hours. ------------------------------------------------------------  RADIOLOGY:   No results found for this or any previous visit. Results for orders placed during the hospital encounter of 12/31/15  DG Chest 2 View   Narrative CLINICAL DATA:  Chest pain and dizziness for 1 year  EXAM: CHEST  2 VIEW  COMPARISON:  10/10/2014  FINDINGS: The heart size and mediastinal contours are within normal limits. Both lungs are clear. The visualized skeletal structures are unremarkable.  IMPRESSION: No active cardiopulmonary disease.   Electronically Signed   By: Alcide Clever M.D.   On: 12/31/2015 16:06    ------------------------------------------------------------------------------------------------------------------  Thank  you for allowing [facility] to assist in the care of your  patient. Our recommendations are noted above.  Please contact us if we can be of further service.   Wells Guiles, MD.  [facility] Office Number: [phone]  Santiago Glad, M.D.  Billy Fischer, M.D  01/08/2018

## 2018-01-08 NOTE — Therapy (Signed)
[place] MAIN [facility] SERVICES [address] [place], [place], [address] Phone: [phone]   Fax:  [phone]  Physical Therapy Treatment  Patient Details  Name: Yesenia Wood MRN: [id number] Date of Birth: [birth year] Referring Provider: Rubie Maid   Encounter Date: 01/08/2018  PT End of Session - 01/09/18 0937    Visit Number  4    Number of Visits  10    Date for PT Re-Evaluation  02/19/18    Authorization Type  Medicare    Authorization - Visit Number  4    Authorization - Number of Visits  10    PT Start Time  1301    PT Stop Time  1407    PT Time Calculation (min)  66 min    Activity Tolerance  Patient tolerated treatment well patient had mildly increased pain at end of visit due to high  irritability of tissue    Behavior During Therapy  [facility] for tasks assessed/performed       Past Medical History:  Diagnosis Date  . B12 deficiency   . Bell's palsy    right sided  . Chronic migraine   . Deaf   . 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 Fatigue   . Fatty liver   . Fibromyalgia   . GERD (gastroesophageal reflux disease)   . Headache   . High triglycerides   . Hyperglycemia   . Hypertension   . Moderate asthma   . Obesity   . Paresthesia   . Premature birth     Past Surgical History:  Procedure Laterality Date  . BARTHOLIN GLAND CYST REMOVAL    . COCHLEAR IMPLANT    . COCHLEAR IMPLANT REMOVAL    . LAPAROSCOPY    . MYRINGOTOMY WITH TUBE PLACEMENT      There were no vitals filed for this visit.   Pelvic Floor Physical Therapy Treatment Note  SCREENING  Changes in medications, allergies, or medical history?: no     SUBJECTIVE  Patient reports: Her pain is "a little bit better" but not much change yet. She has been doing her stretches but forgot what we went over at the first visit/cannot find her sheet.   Pain update:  Location of pain: low  back Current pain:  4/10  Max pain:  7/10 Least pain:  2/10 Nature of pain: constant, tense.  Patient Goals: Be able to walk comfortably and do activities around the house without pain.   OBJECTIVE  Changes in: Posture/Observations:  L ilium remains higher than L, after manual treatment and re-alignment attempts patient demonstrates improved alignment which is still not optimal due to decreased soft tissue length.  Palpation: Patient demonstrated greatly decreased TTP through L abdomen/obliques and L piriformis than at prior visit. TTP through L adductors demonstrate TTP which was decreased by 75% after today's treatment.  Strength: Patient demonstrated improved strength in hip IR/ER with progressive attempts at MET.   INTERVENTIONS THIS SESSION: Manual: TP release to L Piriformis, Obliques, Lumbar paraspinals, QL, and iliacus to allow for improved pelvic alignment and to decrease muscular tension acting on nerves and down-regulate pain sensitivity. Hold-relax to QL, quick-pull and general SIJ MET to improve pelvic alignment x2. Self-care: discussed supported sitting posture using a towel roll to support lumbar curve and a stool for her feet to decrease LBP and PFM  Tightness.   Total time: 66 min.  PT Short Term Goals - 12/12/17 1159      PT SHORT TERM GOAL #1   Title  Patient will demonstrate improved pelvic allignment in standing and supine for improved pelvic position for PFM length/tension relationship.    Time  5    Period  Weeks    Status  New    Target Date  01/15/18      PT SHORT TERM GOAL #2   Title  Patient will undergo Internal/External assessment of the PFM to allow or direct treatment of PFM spasms.    Time  5    Period  Weeks    Status  New    Target Date  01/15/18      PT SHORT TERM GOAL #3   Title  Patient will demonstrate appropriate body mechanics with bending and lifting heavy objects to allow for  decreased stress on the pelvic floor and low back.     Time  5    Period  Weeks    Status  New    Target Date  01/15/18        PT Long Term Goals - 12/12/17 1203      PT LONG TERM GOAL #1   Title  Patient will score at or below 8/43 on the Female NIH-CPSI and 5 on the VQ to demonstrate a clinically meaningful decrease in disability and distress due to pelvic floor dysfunction.    Baseline  NIH- CPSI: 18/43, VQ: 10/33     Time  10    Period  Weeks    Status  New    Target Date  02/19/18      PT LONG TERM GOAL #2   Title  Patient will demonstrate ability to use a tool for self TP release to allow for maintenence of spasm release upon D/C.    Time  10    Period  Weeks    Status  New    Target Date  02/19/18      PT LONG TERM GOAL #3   Title  Patient will report a reduction in pain to no greater than 2/10 over the prior week to demonstrate symptom improvement.    Time  10    Period  Weeks    Status  New    Target Date  02/19/18      PT LONG TERM GOAL #4   Title  Patient will demonstrate improved sitting and standing posture to demonstrate learning and decrease stress on the pelvic floor with functional activity.    Time  10    Period  Weeks    Status  New    Target Date  02/19/18            Plan - 01/09/18 5784    Clinical Impression Statement  Patient responded well to manual treatment today, demonstrating improved pelvic alignment and decreased TTP, though she had a mild increase in pain overall due to high tissue irritability with fibromyalgia. Continue per POC    History and Personal Factors relevant to plan of care:  deaf, multiple co-morbidities    Clinical Presentation  Evolving    Clinical Presentation due to:  Patient continues to be uncomfortable with internal exam and did not feel comfortable coming down to underwear level with sheet drape to allow for MFR today. minimal improvement at this point     Clinical Decision Making  High    Rehab Potential  Fair     Clinical Impairments Affecting Rehab Potential  Deaf, multiple co-morbidities, fibromyalgia    PT Frequency  1x / week    PT Duration  Other (comment) 10 weeks    PT Treatment/Interventions  ADLs/Self Care Home Management;Aquatic Therapy;Biofeedback;Moist Heat;Traction;Ultrasound;Balance training;Gait training;Functional mobility training;Therapeutic activities;Therapeutic exercise;Neuromuscular re-education;Patient/family education;Orthotic Fit/Training;Scar mobilization;Manual techniques;Manual lymph drainage;Dry needling;Taping    PT Next Visit Plan  Body mechanics with bending/lifting, posture training in mirror, TA and single leg balance on step. more TP/MFR to L abdomen.    PT Home Exercise Plan  diaphragmatic breathing, TA in quadruped, child's pose, adductor stretch, piriformis stretch, squatty potty, L side stretch, hip abduction      Consulted and Agree with Plan of Care  Patient       Patient will benefit from skilled therapeutic intervention in order to improve the following deficits and impairments:  Dizziness, Increased fascial restricitons, Impaired sensation, Improper body mechanics, Pain, Decreased coordination, Hypermobility, Postural dysfunction, Increased muscle spasms, Impaired tone, Decreased balance, Difficulty walking  Visit Diagnosis: Myalgia  Other muscle spasm  Abnormal posture  Muscular imbalance     Problem List Patient Active Problem List   Diagnosis Date Noted  . OSA (obstructive sleep apnea) 10/10/2016  . Other insomnia 10/10/2016  . 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 Sleep disturbance 05/10/2016  . Chronic migraine without aura 03/29/2016  . Costochondral chest pain 12/28/2015  . Pain in the chest 06/09/2015  . Palpitations 06/09/2015  . Deaf 05/16/2015  . B12 deficiency 05/16/2015  . Cochlear implant status 05/16/2015  . Fatty infiltration of liver 05/16/2015  . Fibromyalgia syndrome 05/16/2015  . Gastro-esophageal reflux  disease without esophagitis 05/16/2015  . Generalized headache 05/16/2015  . Right-sided Bell's palsy 05/16/2015  . H/O: depression 05/16/2015  . Hypertriglyceridemia 05/16/2015  . Blood glucose elevated 05/16/2015  . Irregular menstrual cycle 05/16/2015  . Overweight 05/16/2015  . Paresthesia 05/16/2015  . Disorder of labyrinth 05/16/2015  . Dyspnea 10/07/2014  . Moderate persistent intrinsic asthma without status asthmaticus without complication 57/84/6962   Willa Rough DPT, ATC Willa Rough 01/09/2018, 9:56 AM  [place] MAIN [facility] SERVICES [address] [place], [place], [address] Phone: [phone]   Fax:  [phone]  Name: MARYJO RAGON MRN: [id number] Date of Birth: March 22, 1986

## 2018-01-08 NOTE — Patient Instructions (Signed)
Hip Abduction (Standing)    Stand with support. Squeeze pelvic floor and hold. Lift right leg out to side, keeping toe forward. Hold for _2__ seconds. Relax for _1__ seconds. Repeat __10_ times. Do _3__ sets  a day.    Hip Abduction: Side Leg Lift - Side-Lying    Lie on side. Draw lower tummy muscle (TA) and pelvic floor in, Lift top leg until you feel strong contraction of muscle on the side of the hip. Keep top leg straight with body, toes pointing forward. Do not let your hip roll back! Do _10__ reps per set, __3_ sets per day     Hold for 5 breaths, relax, repeat 2-3 times once a day.  Stabilization: Diaphragmatic Breathing    Lie with knees bent, feet flat. Place one hand on stomach, other on chest. Breathe deeply through nose, lifting belly hand without any motion of hand on chest. Repeat __10__ times per set. Do _2-3___ sets per session. Do __7__ sessions per week.(once per day)

## 2018-01-09 ENCOUNTER — Ambulatory Visit (INDEPENDENT_AMBULATORY_CARE_PROVIDER_SITE_OTHER): Payer: Medicare Other | Admitting: Internal Medicine

## 2018-01-09 ENCOUNTER — Encounter: Payer: Self-pay | Admitting: Internal Medicine

## 2018-01-09 VITALS — BP 114/68 | HR 83 | Resp 16 | Ht 63.0 in | Wt 154.0 lb

## 2018-01-09 DIAGNOSIS — F5101 Primary insomnia: Secondary | ICD-10-CM | POA: Diagnosis not present

## 2018-01-09 DIAGNOSIS — J4540 Moderate persistent asthma, uncomplicated: Secondary | ICD-10-CM

## 2018-01-09 NOTE — Patient Instructions (Signed)
Avoid foods and known allergens, these can make your breathing worse.  Call us back if you breathing worsens.   Continue/increase activity as tolerated.

## 2018-01-15 ENCOUNTER — Ambulatory Visit: Payer: Medicare Other

## 2018-01-15 DIAGNOSIS — M791 Myalgia, unspecified site: Secondary | ICD-10-CM

## 2018-01-15 DIAGNOSIS — M62838 Other muscle spasm: Secondary | ICD-10-CM

## 2018-01-15 DIAGNOSIS — M6289 Other specified disorders of muscle: Secondary | ICD-10-CM

## 2018-01-15 DIAGNOSIS — M629 Disorder of muscle, unspecified: Secondary | ICD-10-CM | POA: Diagnosis not present

## 2018-01-15 DIAGNOSIS — R293 Abnormal posture: Secondary | ICD-10-CM | POA: Diagnosis not present

## 2018-01-15 NOTE — Patient Instructions (Signed)
1) when you walk, think "tall" and make sure that the lower tummy muscles are turned on, pull your shoulders back some too, but that is less important.  2) focus on doing your hip stretches for the inner thigh and the front of your thigh.   3) make sure you are doing the exercise we practiced today where you were leaning over the table and pulling in the tummy muscles to help strengthen the lower tummy muscle.

## 2018-01-15 NOTE — Therapy (Signed)
[place] MAIN [facility] SERVICES [address] [place], [place], [address] Phone: [phone]   Fax:  [phone]  Physical Therapy Treatment  Patient Details  Name: Yesenia Wood MRN: [id number] Date of Birth: Dec 16, 1985 Referring Provider: Rubie Maid   Encounter Date: 01/15/2018  PT End of Session - 01/15/18 1137    Visit Number  5    Number of Visits  10    Date for PT Re-Evaluation  02/19/18    Authorization Type  Medicare    Authorization - Visit Number  5    Authorization - Number of Visits  10    PT Start Time  0927    PT Stop Time  1033    PT Time Calculation (min)  66 min    Activity Tolerance  Patient tolerated treatment well    Behavior During Therapy  [facility] for tasks assessed/performed       Past Medical History:  Diagnosis Date  . B12 deficiency   . Bell's palsy    right sided  . Chronic migraine   . Deaf   . 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 Fatigue   . Fatty liver   . Fibromyalgia   . GERD (gastroesophageal reflux disease)   . Headache   . High triglycerides   . Hyperglycemia   . Hypertension   . Moderate asthma   . Obesity   . Paresthesia   . Premature birth     Past Surgical History:  Procedure Laterality Date  . BARTHOLIN GLAND CYST REMOVAL    . COCHLEAR IMPLANT    . COCHLEAR IMPLANT REMOVAL    . LAPAROSCOPY    . MYRINGOTOMY WITH TUBE PLACEMENT      There were no vitals filed for this visit.    Pelvic Floor Physical Therapy Treatment Note  SCREENING  Changes in medications, allergies, or medical history?: no     SUBJECTIVE  Patient reports: Her pain is a little bit better today, around a 6 instead of 7 last week. She has been very busy for the last few days with her 8 year old niece at the house and is very tired. Has not done exercises for the last few days but was doing them 2-3 times per day earlier in the week.  Precautions:   deaf  Pain update:  Location of pain: back, hips, pelvis Current pain:  6/10  Max pain:  6/10 Least pain:  4/10 Nature of pain: constant, tense  Patient Goals: Be able to walk comfortably and do activities around the house without pain.   OBJECTIVE  Changes in: Posture/Observations:  Patient drags feet when she walks, sways laterally, and "sinks" into her hips with stance. Prefers to sit cross-legged. Patient was able to demonstrate improved posture in standing and ambulation but needs greater hip flexor ROM to achieve best posture and TA activation.   INTERVENTIONS THIS SESSION: NM Re-ed: Using a mirror, patient educated on good standing posture. Used pelvic tilts on physio-ball to help her feel the pelvic tilt better, patient complains that this increases her back pain. Reviewed TA contractions in mod-quad to improve recruitment with the visual feedback of the mirror. Worked on TA activation with ambulation and shoulder retraction/depression and cue to walk "tall" for decreased gravitational pressure by allowing the force to be better dispersed throughout the whole body so the PFM can relax.  Manual: performed contract-relax lengthening of the Quads B and  used roller to help decrease fascial restriction and improve blood flow for improved spasm reduction and less pain. Patient could only tolerate minimal pressure with roller, many restrictions noted, especially high near the inguinal fold.   Total time: 66 min.                        PT Education - 01/15/18 1200    Education provided  Yes    Education Details  See Pt. instructions and Interventions this session.    Person(s) Educated  Patient    Methods  Explanation;Demonstration;Tactile cues;Verbal cues;Handout via translator    Comprehension  Verbalized understanding;Returned demonstration;Verbal cues required;Tactile cues required;Need further instruction via translator       PT Short Term Goals -  01/15/18 1135      PT SHORT TERM GOAL #1   Title  Patient will demonstrate improved pelvic allignment in standing and supine for improved pelvic position for PFM length/tension relationship.    Time  5    Period  Weeks    Status  Achieved    Target Date  01/15/18      PT SHORT TERM GOAL #2   Title  Patient will undergo Internal/External assessment of the PFM to allow or direct treatment of PFM spasms.    Time  5    Period  Weeks    Status  Not Met    Target Date  01/15/18      PT SHORT TERM GOAL #3   Title  Patient will demonstrate appropriate body mechanics with bending and lifting heavy objects to allow for decreased stress on the pelvic floor and low back.     Time  5    Period  Weeks    Status  On-going    Target Date  01/15/17        PT Long Term Goals - 12/12/17 1203      PT LONG TERM GOAL #1   Title  Patient will score at or below 8/43 on the Female NIH-CPSI and 5 on the VQ to demonstrate a clinically meaningful decrease in disability and distress due to pelvic floor dysfunction.    Baseline  NIH- CPSI: 18/43, VQ: 10/33     Time  10    Period  Weeks    Status  New    Target Date  02/19/18      PT LONG TERM GOAL #2   Title  Patient will demonstrate ability to use a tool for self TP release to allow for maintenence of spasm release upon D/C.    Time  10    Period  Weeks    Status  New    Target Date  02/19/18      PT LONG TERM GOAL #3   Title  Patient will report a reduction in pain to no greater than 2/10 over the prior week to demonstrate symptom improvement.    Time  10    Period  Weeks    Status  New    Target Date  02/19/18      PT LONG TERM GOAL #4   Title  Patient will demonstrate improved sitting and standing posture to demonstrate learning and decrease stress on the pelvic floor with functional activity.    Time  10    Period  Weeks    Status  New    Target Date  02/19/18            Plan -  01/15/18 1138    Clinical Impression Statement   Patient responded well to posture training in standing and with ambulation today. She continues to have difficulty engaging TA in standing due to decreased hip extension ROM. Patient recognized that she has less pain when she stands with tall supported posture than when she does not but has a harder time recognizing this when ambulating. Continue per POC.    Clinical Presentation  Evolving    Clinical Presentation due to:  Patient continues to be uncomfortable with even wearing loose shorts that would allow acces to the thighs for manual treatment, we are therefore using an educated assumption of what is happening with the PFM and may not be using the most effective treatments but the patient continues to come to appointments and demonstrates moderate involvement in her treatment.    Clinical Decision Making  High    Rehab Potential  Fair    Clinical Impairments Affecting Rehab Potential  Deaf, multiple co-morbidities, fibromyalgia    PT Frequency  1x / week    PT Duration  Other (comment) 10 weeks    PT Treatment/Interventions  ADLs/Self Care Home Management;Aquatic Therapy;Biofeedback;Moist Heat;Traction;Ultrasound;Balance training;Gait training;Functional mobility training;Therapeutic activities;Therapeutic exercise;Neuromuscular re-education;Patient/family education;Orthotic Fit/Training;Scar mobilization;Manual techniques;Manual lymph drainage;Dry needling;Taping    PT Next Visit Plan  Body mechanics with bending/lifting, single leg balance on step. more TP/MFR to L abdomen.    PT Home Exercise Plan  diaphragmatic breathing, TA in quadruped, child's pose, adductor stretch, piriformis stretch, squatty potty, L side stretch, hip abduction, tall posture walking.      Recommended Other Services  cool pool exercise    Consulted and Agree with Plan of Care  Patient       Patient will benefit from skilled therapeutic intervention in order to improve the following deficits and impairments:  Dizziness,  Increased fascial restricitons, Impaired sensation, Improper body mechanics, Pain, Decreased coordination, Hypermobility, Postural dysfunction, Increased muscle spasms, Impaired tone, Decreased balance, Difficulty walking  Visit Diagnosis: Myalgia  Other muscle spasm  Abnormal posture  Muscular imbalance     Problem List Patient Active Problem List   Diagnosis Date Noted  . OSA (obstructive sleep apnea) 10/10/2016  . Other insomnia 10/10/2016  . 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 Sleep disturbance 05/10/2016  . Chronic migraine without aura 03/29/2016  . Costochondral chest pain 12/28/2015  . Pain in the chest 06/09/2015  . Palpitations 06/09/2015  . Deaf 05/16/2015  . B12 deficiency 05/16/2015  . Cochlear implant status 05/16/2015  . Fatty infiltration of liver 05/16/2015  . Fibromyalgia syndrome 05/16/2015  . Gastro-esophageal reflux disease without esophagitis 05/16/2015  . Generalized headache 05/16/2015  . Right-sided Bell's palsy 05/16/2015  . H/O: depression 05/16/2015  . Hypertriglyceridemia 05/16/2015  . Blood glucose elevated 05/16/2015  . Irregular menstrual cycle 05/16/2015  . Overweight 05/16/2015  . Paresthesia 05/16/2015  . Disorder of labyrinth 05/16/2015  . Dyspnea 10/07/2014  . Moderate persistent intrinsic asthma without status asthmaticus without complication 26/94/8546   Willa Rough DPT, ATC Willa Rough 01/15/2018, 12:02 PM  [place] MAIN [facility] SERVICES [address] [place], [place], [address] Phone: [phone]   Fax:  [phone]  Name: Yesenia Wood MRN: [id number] Date of Birth: 07-13-1986

## 2018-01-21 ENCOUNTER — Other Ambulatory Visit: Payer: Self-pay | Admitting: Family Medicine

## 2018-01-21 DIAGNOSIS — J029 Acute pharyngitis, unspecified: Secondary | ICD-10-CM

## 2018-01-21 DIAGNOSIS — R109 Unspecified abdominal pain: Secondary | ICD-10-CM

## 2018-01-21 DIAGNOSIS — K219 Gastro-esophageal reflux disease without esophagitis: Secondary | ICD-10-CM

## 2018-01-22 ENCOUNTER — Ambulatory Visit: Payer: Medicare Other

## 2018-01-27 ENCOUNTER — Encounter: Payer: Self-pay | Admitting: Cardiovascular Disease

## 2018-01-29 ENCOUNTER — Telehealth: Payer: Self-pay | Admitting: Family Medicine

## 2018-01-29 ENCOUNTER — Encounter: Payer: Self-pay | Admitting: Family Medicine

## 2018-01-29 ENCOUNTER — Ambulatory Visit (INDEPENDENT_AMBULATORY_CARE_PROVIDER_SITE_OTHER): Payer: Medicare Other | Admitting: Family Medicine

## 2018-01-29 ENCOUNTER — Other Ambulatory Visit: Payer: Self-pay | Admitting: Family Medicine

## 2018-01-29 VITALS — BP 110/80 | HR 95 | Resp 16 | Ht 63.0 in | Wt 152.2 lb

## 2018-01-29 DIAGNOSIS — G43709 Chronic migraine without aura, not intractable, without status migrainosus: Secondary | ICD-10-CM

## 2018-01-29 DIAGNOSIS — R739 Hyperglycemia, unspecified: Secondary | ICD-10-CM | POA: Diagnosis not present

## 2018-01-29 DIAGNOSIS — E538 Deficiency of other specified B group vitamins: Secondary | ICD-10-CM

## 2018-01-29 DIAGNOSIS — Z114 Encounter for screening for human immunodeficiency virus [HIV]: Secondary | ICD-10-CM

## 2018-01-29 DIAGNOSIS — J4540 Moderate persistent asthma, uncomplicated: Secondary | ICD-10-CM | POA: Diagnosis not present

## 2018-01-29 DIAGNOSIS — R6889 Other general symptoms and signs: Secondary | ICD-10-CM

## 2018-01-29 DIAGNOSIS — L309 Dermatitis, unspecified: Secondary | ICD-10-CM

## 2018-01-29 DIAGNOSIS — M797 Fibromyalgia: Secondary | ICD-10-CM | POA: Diagnosis not present

## 2018-01-29 DIAGNOSIS — F330 Major depressive disorder, recurrent, mild: Secondary | ICD-10-CM

## 2018-01-29 DIAGNOSIS — R202 Paresthesia of skin: Secondary | ICD-10-CM

## 2018-01-29 DIAGNOSIS — Z79899 Other long term (current) drug therapy: Secondary | ICD-10-CM | POA: Diagnosis not present

## 2018-01-29 DIAGNOSIS — E785 Hyperlipidemia, unspecified: Secondary | ICD-10-CM

## 2018-01-29 MED ORDER — CYANOCOBALAMIN 1000 MCG/ML IJ SOLN
1000.0000 ug | Freq: Once | INTRAMUSCULAR | Status: AC
  Administered 2018-01-29: 1000 ug via INTRAMUSCULAR

## 2018-01-29 MED ORDER — SERTRALINE HCL 50 MG PO TABS: 25.0000 mg | ORAL_TABLET | Freq: Every day | ORAL | 1 refills | Status: DC

## 2018-01-29 MED ORDER — PIMECROLIMUS 1 % EX CREA: TOPICAL_CREAM | Freq: Two times a day (BID) | CUTANEOUS | 0 refills | Status: DC

## 2018-01-29 MED ORDER — TACROLIMUS 0.1 % EX OINT: TOPICAL_OINTMENT | Freq: Two times a day (BID) | CUTANEOUS | 0 refills | Status: DC

## 2018-01-29 NOTE — Telephone Encounter (Signed)
done

## 2018-01-29 NOTE — Progress Notes (Signed)
Name: Yesenia Wood   MRN: [id number]    DOB: 06/27/1986   Date:01/29/2018       Progress Note  Subjective  Chief Complaint  Chief Complaint  Patient presents with  . Medication Refill    3 month F/U  . Migraine  . Asthma  . Insomnia  . Fibromyalgia    HPI  Rash: she developed a red rash on right side of face, that was not itchy but more like a burning sensation. Intermittently. She has been using a cold towel, but it does not really helping with symptoms. She has tried Aloe lotion with mild improvement of symptoms. Advised to continue topical lotion for now, avoid steroids.It may be eczema and since it is on her face we will try Elidel  Asthma: sees Dr. Linward Natalamachandra, She is intolerant to a lot of medications.  She is not on anything right now. She has SOB intermittently but no wheezing, occasionally wheezing  FMS: she has paresthesia, daily pain, she tried PT without  Improvement of  symptoms.   B12 deficiency: with paresthesia, she would like to start B12 injections, forgets to take oral medication on a regular basis.   [facility] follows with Dr. Haskell RilingFreedman and will call back for follow up  Right eye dryness: advised to follow up with eye doctor for further evaluation, but advised otc clear tears.   Depression Major: she is  tired of feeling sick, no energy, sometimes feels hopeless, still able to get up and shower, eat and take care of basic needs. She states the only thing that motivates her is her dog. She needs to get up to take care of him.    Patient Active Problem List   Diagnosis Date Noted  . OSA (obstructive sleep apnea) 10/10/2016  . Other insomnia 10/10/2016  . Dissocial personality disorder ([facility]) 06/07/2016  . Sleep disturbance 05/10/2016  . Chronic migraine without aura 03/29/2016  . Costochondral chest pain 12/28/2015  . Pain in the chest 06/09/2015  . Palpitations 06/09/2015  . Deaf 05/16/2015  . B12 deficiency 05/16/2015  . Cochlear  implant status 05/16/2015  . Fatty infiltration of liver 05/16/2015  . Fibromyalgia syndrome 05/16/2015  . Gastro-esophageal reflux disease without esophagitis 05/16/2015  . Generalized headache 05/16/2015  . Right-sided Bell's palsy 05/16/2015  . H/O: depression 05/16/2015  . Hypertriglyceridemia 05/16/2015  . Blood glucose elevated 05/16/2015  . Irregular menstrual cycle 05/16/2015  . Overweight 05/16/2015  . Paresthesia 05/16/2015  . Disorder of labyrinth 05/16/2015  . Dyspnea 10/07/2014  . Moderate persistent intrinsic asthma without status asthmaticus without complication 10/07/2014    Past Surgical History:  Procedure Laterality Date  . BARTHOLIN GLAND CYST REMOVAL    . COCHLEAR IMPLANT    . COCHLEAR IMPLANT REMOVAL    . LAPAROSCOPY    . MYRINGOTOMY WITH TUBE PLACEMENT      Family History  Adopted: Yes  Family history unknown: Yes    Social History   Socioeconomic History  . Marital status: Single    Spouse name: Not on file  . Number of children: Not on file  . Years of education: Not on file  . Highest education level: Not on file  Social Needs  . Financial resource strain: Not on file  . Food insecurity - worry: Not on file  . Food insecurity - inability: Not on file  . Transportation needs - medical: Not on file  . Transportation needs - non-medical: Not on file  Occupational History  .  Not on file  Tobacco Use  . Smoking status: Former Smoker    Packs/day: 1.00    Years: 2.00    Pack years: 2.00    Types: Cigarettes    Last attempt to quit: 11/28/2006    Years since quitting: 11.1  . Smokeless tobacco: Never Used  Substance and Sexual Activity  . Alcohol use: Yes    Alcohol/week: 0.0 oz    Comment: rarely  . Drug use: No  . Sexual activity: Not Currently    Birth control/protection: Pill    Comment: Seasonale  Other Topics Concern  . Not on file  Social History Narrative   Lives with her parents     Current Outpatient Medications:  .   dicyclomine (BENTYL) 20 MG tablet, Take 1 tablet (20 mg total) by mouth every 6 (six) hours., Disp: 270 tablet, Rfl: 1 .  levonorgestrel-ethinyl estradiol (SEASONALE,INTROVALE,JOLESSA) 0.15-0.03 MG tablet, Take 1 tablet by mouth daily., Disp: 1 Package, Rfl: 4 .  vitamin B-12 (CYANOCOBALAMIN) 1000 MCG tablet, Take 1,000 mcg by mouth daily., Disp: , Rfl:  .  diltiazem (CARDIZEM CD) 180 MG 24 hr capsule, Take 1 capsule (180 mg total) daily by mouth., Disp: 90 capsule, Rfl: 3  Allergies  Allergen Reactions  . Budesonide-Formoterol Fumarate Anaphylaxis and Other (See Comments)    Other reaction(s): Other (See Comments) chest pain Chest pain chest pain  . Astelin [Azelastine]     Tingling, numbness, nausea  . Gabapentin Nausea And Vomiting  . Nortriptyline Other (See Comments)    Other reaction(s): Other (See Comments)  . Diclofenac Other (See Comments)    Other reaction(s): Other (See Comments)  . Duloxetine Hcl Other (See Comments)    Other reaction(s): Other (See Comments)  . Montelukast Sodium Other (See Comments)  . Mupirocin Other (See Comments)  . Naproxen Other (See Comments) and Rash  . Naproxen Sodium Rash    mild rash  . Sulfa Antibiotics Hives and Rash     ROS  Constitutional: Negative for fever or weight change. Cold intolerance Respiratory: Negative for cough and shortness of breath.   Cardiovascular: Negative for chest pain or palpitations.  Gastrointestinal: Negative for abdominal pain, no bowel changes.  Musculoskeletal: Negative for gait problem or joint swelling.  Skin: Positive for rash.  Neurological: Negative for dizziness , positive for intermittent  headache.  No other specific complaints in a complete review of systems (except as listed in HPI above).  Objective  Vitals:   01/29/18 1011  BP: 110/80  Pulse: 95  Resp: 16  SpO2: 98%  Weight: 152 lb 3.2 oz (69 kg)  Height: 5\' 3"  (1.6 m)    Body mass index is 26.96 kg/m.  Physical  Exam  Constitutional: Patient appears well-developed and well-nourished. Obese  No distress.  HEENT: head atraumatic, normocephalic, pupils equal and reactive to light,  neck supple, throat within normal limits Cardiovascular: Normal rate, regular rhythm and normal heart sounds.  No murmur heard. No BLE edema. Pulmonary/Chest: Effort normal and breath sounds normal. No respiratory distress. Abdominal: Soft.  There is no tenderness. Skin: some erythema on right maxillary area Psychiatric: Patient has a normal mood and affect. behavior is normal. Judgment and thought content normal.  PHQ2/9: Depression screen [facility] 2/9 01/29/2018 04/11/2017 06/07/2016 03/16/2016 01/28/2016  Decreased Interest 1 0 0 0 0  Down, Depressed, Hopeless 1 1 0 0 0  PHQ - 2 Score 2 1 0 0 0  Altered sleeping 3 - - - -  Tired, decreased  energy 3 - - - -  Change in appetite 0 - - - -  Feeling bad or failure about yourself  0 - - - -  Trouble concentrating 0 - - - -  Moving slowly or fidgety/restless 1 - - - -  Suicidal thoughts 0 - - - -  PHQ-9 Score 9 - - - -  Difficult doing work/chores Somewhat difficult - - - -     Fall Risk: Fall Risk  10/26/2017 04/11/2017 06/07/2016 03/16/2016 01/28/2016  Falls in the past year? No No No Yes Yes  Number falls in past yr: - - - 1 1  Injury with Fall? - - - Yes No  Comment - - - - wrist pain  Risk Factor Category  - - - - -     Functional Status Survey: Is the patient deaf or have difficulty hearing?: Yes Does the patient have difficulty seeing, even when wearing glasses/contacts?: No Does the patient have difficulty concentrating, remembering, or making decisions?: No Does the patient have difficulty walking or climbing stairs?: No Does the patient have difficulty dressing or bathing?: No Does the patient have difficulty doing errands alone such as visiting a doctor's office or shopping?: Yes(Deaf)    Assessment & Plan   1. Fibromyalgia syndrome  She still has daily pain    2. Encounter for screening for HIV  - HIV antibody  3. Chronic migraine without aura without status migrainosus, not intractable   4. Moderate persistent intrinsic asthma without status asthmaticus without complication  Seeing pulmonologist   5. Paresthesia  Seen by neurologist in the past  6. B12 deficiency  - cyanocobalamin ((VITAMIN B-12)) injection 1,000 mcg - Vitamin B12  7. Eczema of face  - pimecrolimus (ELIDEL) 1 % cream; Apply topically 2 (two) times daily.  Dispense: 30 g; Refill: 0  8. Depression, major, recurrent, mild (HCC)  - sertraline (ZOLOFT) 50 MG tablet; Take 0.5-1 tablets (25-50 mg total) by mouth daily. Start with half pill and go up to 50 after the first week  Dispense: 30 tablet; Refill: 1  9. Hyperglycemia  - Hemoglobin A1c  10. Dyslipidemia  - Lipid panel  11. Cold intolerance  - TSH  12. Long-term use of high-risk medication  - COMPLETE METABOLIC PANEL WITH GFR - CBC with Differential/Platelet

## 2018-01-29 NOTE — Telephone Encounter (Signed)
Copied from CRM [phone]. Topic: Quick Communication - See Telephone Encounter >> Jan 29, 2018 12:48 PM Waymon AmatoBurton, Donna F wrote: CRM for notification. See Telephone encounter for:  Pt pharmacy is calling to see if they can change from pimecrolimus cream which is not covered by insurance  to tacrolinus oientment which is covered    Best number is [phone] 01/29/18.

## 2018-01-30 LAB — CBC WITH DIFFERENTIAL/PLATELET
Basophils Absolute: 58 cells/uL (ref 0–200)
Basophils Relative: 1 %
EOS ABS: 29 {cells}/uL (ref 15–500)
Eosinophils Relative: 0.5 %
HCT: 42.1 % (ref 35.0–45.0)
Hemoglobin: 14.1 g/dL (ref 11.7–15.5)
Lymphs Abs: 2366 cells/uL (ref 850–3900)
MCH: 29.7 pg (ref 27.0–33.0)
MCHC: 33.5 g/dL (ref 32.0–36.0)
MCV: 88.6 fL (ref 80.0–100.0)
MPV: 10.4 fL (ref 7.5–12.5)
Monocytes Relative: 8.5 %
NEUTROS PCT: 49.2 %
Neutro Abs: 2854 cells/uL (ref 1500–7800)
PLATELETS: 355 10*3/uL (ref 140–400)
RBC: 4.75 10*6/uL (ref 3.80–5.10)
RDW: 12.4 % (ref 11.0–15.0)
TOTAL LYMPHOCYTE: 40.8 %
WBC: 5.8 10*3/uL (ref 3.8–10.8)
WBCMIX: 493 {cells}/uL (ref 200–950)

## 2018-01-30 LAB — LIPID PANEL
CHOLESTEROL: 241 mg/dL — AB (ref <200)
HDL: 39 mg/dL — ABNORMAL LOW (ref >50)
LDL CHOLESTEROL (CALC): 168 mg/dL — AB
Non-HDL Cholesterol (Calc): 202 mg/dL (calc) — ABNORMAL HIGH (ref <130)
Total CHOL/HDL Ratio: 6.2 (calc) — ABNORMAL HIGH (ref <5.0)
Triglycerides: 184 mg/dL — ABNORMAL HIGH (ref <150)

## 2018-01-30 LAB — COMPLETE METABOLIC PANEL WITH GFR
AG RATIO: 1.7 (calc) (ref 1.0–2.5)
ALT: 10 U/L (ref 6–29)
AST: 12 U/L (ref 10–30)
Albumin: 4.7 g/dL (ref 3.6–5.1)
Alkaline phosphatase (APISO): 52 U/L (ref 33–115)
BILIRUBIN TOTAL: 0.6 mg/dL (ref 0.2–1.2)
BUN: 7 mg/dL (ref 7–25)
CALCIUM: 9.5 mg/dL (ref 8.6–10.2)
CHLORIDE: 104 mmol/L (ref 98–110)
CO2: 25 mmol/L (ref 20–32)
Creat: 0.77 mg/dL (ref 0.50–1.10)
GFR, EST AFRICAN AMERICAN: 118 mL/min/{1.73_m2} (ref >=60)
GFR, EST NON AFRICAN AMERICAN: 102 mL/min/{1.73_m2} (ref >=60)
GLOBULIN: 2.8 g/dL (ref 1.9–3.7)
Glucose, Bld: 83 mg/dL (ref 65–139)
POTASSIUM: 3.8 mmol/L (ref 3.5–5.3)
Sodium: 138 mmol/L (ref 135–146)
TOTAL PROTEIN: 7.5 g/dL (ref 6.1–8.1)

## 2018-01-30 LAB — HIV ANTIBODY (ROUTINE TESTING W REFLEX): HIV 1&2 Ab, 4th Generation: NONREACTIVE

## 2018-01-30 LAB — HEMOGLOBIN A1C
HEMOGLOBIN A1C: 4.9 %{Hb} (ref <5.7)
MEAN PLASMA GLUCOSE: 94 (calc)
eAG (mmol/L): 5.2 (calc)

## 2018-01-30 LAB — TSH: TSH: 2.84 mIU/L

## 2018-01-30 NOTE — Telephone Encounter (Signed)
[facility] requesting an alternative to Tacrolimus ointment. It is a non-formualry drug. Please advise.

## 2018-01-30 NOTE — Telephone Encounter (Signed)
Note from pharmacy not Elidel but that Protopic was covered. Not sure what they are talking about

## 2018-02-02 ENCOUNTER — Ambulatory Visit: Payer: Medicare Other | Attending: Obstetrics and Gynecology

## 2018-02-02 DIAGNOSIS — M791 Myalgia, unspecified site: Secondary | ICD-10-CM | POA: Diagnosis not present

## 2018-02-02 DIAGNOSIS — M629 Disorder of muscle, unspecified: Secondary | ICD-10-CM | POA: Insufficient documentation

## 2018-02-02 DIAGNOSIS — R293 Abnormal posture: Secondary | ICD-10-CM | POA: Diagnosis not present

## 2018-02-02 DIAGNOSIS — M62838 Other muscle spasm: Secondary | ICD-10-CM | POA: Diagnosis not present

## 2018-02-02 DIAGNOSIS — M6289 Other specified disorders of muscle: Secondary | ICD-10-CM

## 2018-02-02 NOTE — Therapy (Signed)
[place] MAIN [facility] SERVICES [address] [place], [place], [address] Phone: [phone]   Fax:  [phone]  Physical Therapy Treatment  Patient Details  Name: Yesenia Wood MRN: [id number] Date of Birth: [birth year] Referring Provider: Rubie Maid   Encounter Date: 02/02/2018  PT End of Session - 02/02/18 1154    Visit Number  6    Number of Visits  10    Date for PT Re-Evaluation  02/19/18    Authorization Type  Medicare    Authorization - Visit Number  6    Authorization - Number of Visits  10    PT Start Time  0905    PT Stop Time  1010    PT Time Calculation (min)  65 min    Activity Tolerance  Patient tolerated treatment well    Behavior During Therapy  [facility] for tasks assessed/performed       Past Medical History:  Diagnosis Date  . B12 deficiency   . Bell's palsy    right sided  . Chronic migraine   . Deaf   . 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 Fatigue   . Fatty liver   . Fibromyalgia   . GERD (gastroesophageal reflux disease)   . Headache   . High triglycerides   . Hyperglycemia   . Hypertension   . Moderate asthma   . Obesity   . Paresthesia   . Premature birth     Past Surgical History:  Procedure Laterality Date  . BARTHOLIN GLAND CYST REMOVAL    . COCHLEAR IMPLANT    . COCHLEAR IMPLANT REMOVAL    . LAPAROSCOPY    . MYRINGOTOMY WITH TUBE PLACEMENT      There were no vitals filed for this visit.    Pelvic Floor Physical Therapy Treatment Note  SCREENING  Changes in medications, allergies, or medical history?: Is being put on a depression medication short-term to help her with the pain.    SUBJECTIVE  Patient reports: She had the flu last week and started her period today so does not feel good.   Precautions:  deaf  Pain update:  Location of pain: pelvis Current pain:  5/10 (3/5 after treatment) Max pain:  7/10 Least pain:   5/10 Nature of pain: "continuous stab"  Patient Goals: Be able to walk comfortably and do activities around the house without pain.   OBJECTIVE  INTERVENTIONS THIS SESSION: Manual: Performed TP release to QL, psoas, and obliques B to decrease pain and tension on nerves that innervate the pelvic muscles. Patient has high irritability and is slow to have decreased TP's but did respond well to treatment, just slowly. She benefits from distraction via conversation.   NM re-ed: Pt. educated on and practiced squat form with exhale to engage TA to prevent further stress on the nerves in the back and abdomen that are increasing her pelvic pain. Re-emphasized the importance of activating her TA with walking and movement as well as doing her HEP to down-regulate tight extensors via reciprocal inhibition.  Total time: 65 min.                         PT Education - 02/02/18 1154    Education provided  Yes    Education Details  See Interventions this session    Person(s) Educated  Patient    Methods  Explanation;Demonstration;Verbal cues via Optometrist  Comprehension  Verbalized understanding via translator       PT Short Term Goals - 01/15/18 1135      PT SHORT TERM GOAL #1   Title  Patient will demonstrate improved pelvic allignment in standing and supine for improved pelvic position for PFM length/tension relationship.    Time  5    Period  Weeks    Status  Achieved    Target Date  01/15/18      PT SHORT TERM GOAL #2   Title  Patient will undergo Internal/External assessment of the PFM to allow or direct treatment of PFM spasms.    Time  5    Period  Weeks    Status  Not Met    Target Date  01/15/18      PT SHORT TERM GOAL #3   Title  Patient will demonstrate appropriate body mechanics with bending and lifting heavy objects to allow for decreased stress on the pelvic floor and low back.     Time  5    Period  Weeks    Status  On-going    Target Date   01/15/17        PT Long Term Goals - 12/12/17 1203      PT LONG TERM GOAL #1   Title  Patient will score at or below 8/43 on the Female NIH-CPSI and 5 on the VQ to demonstrate a clinically meaningful decrease in disability and distress due to pelvic floor dysfunction.    Baseline  NIH- CPSI: 18/43, VQ: 10/33     Time  10    Period  Weeks    Status  New    Target Date  02/19/18      PT LONG TERM GOAL #2   Title  Patient will demonstrate ability to use a tool for self TP release to allow for maintenence of spasm release upon D/C.    Time  10    Period  Weeks    Status  New    Target Date  02/19/18      PT LONG TERM GOAL #3   Title  Patient will report a reduction in pain to no greater than 2/10 over the prior week to demonstrate symptom improvement.    Time  10    Period  Weeks    Status  New    Target Date  02/19/18      PT LONG TERM GOAL #4   Title  Patient will demonstrate improved sitting and standing posture to demonstrate learning and decrease stress on the pelvic floor with functional activity.    Time  10    Period  Weeks    Status  New    Target Date  02/19/18            Plan - 02/02/18 1156    Clinical Impression Statement  Patient responded well to treatment today but has not been diligent with her HEP and is not making much progress between sessions. Patient recently put on new depression medication which should help her improve her motivation to perform HEP, discussed importance of this at length today during manual treatment. Continue per POC.    Clinical Presentation  Evolving    Clinical Decision Making  High    Rehab Potential  Fair    Clinical Impairments Affecting Rehab Potential  Deaf, multiple co-morbidities, fibromyalgia    PT Frequency  1x / week    PT Duration  -- 10 weeks  PT Treatment/Interventions  ADLs/Self Care Home Management;Aquatic Therapy;Biofeedback;Moist Heat;Traction;Ultrasound;Balance training;Gait training;Functional mobility  training;Therapeutic activities;Therapeutic exercise;Neuromuscular re-education;Patient/family education;Orthotic Fit/Training;Scar mobilization;Manual techniques;Manual lymph drainage;Dry needling;Taping    PT Next Visit Plan  bird-dog with TA, manual to adductors if Pt. brings shorts.    PT Home Exercise Plan  diaphragmatic breathing, TA in quadruped, child's pose, adductor stretch, piriformis stretch, squatty potty, L side stretch, hip abduction, tall posture walking.      Consulted and Agree with Plan of Care  Patient       Patient will benefit from skilled therapeutic intervention in order to improve the following deficits and impairments:  Dizziness, Increased fascial restricitons, Impaired sensation, Improper body mechanics, Pain, Decreased coordination, Hypermobility, Postural dysfunction, Increased muscle spasms, Impaired tone, Decreased balance, Difficulty walking  Visit Diagnosis: Myalgia  Other muscle spasm  Abnormal posture  Muscular imbalance     Problem List Patient Active Problem List   Diagnosis Date Noted  . OSA (obstructive sleep apnea) 10/10/2016  . Other insomnia 10/10/2016  . 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 Sleep disturbance 05/10/2016  . Chronic migraine without aura 03/29/2016  . Costochondral chest pain 12/28/2015  . Pain in the chest 06/09/2015  . Palpitations 06/09/2015  . Deaf 05/16/2015  . B12 deficiency 05/16/2015  . Cochlear implant status 05/16/2015  . Fatty infiltration of liver 05/16/2015  . Fibromyalgia syndrome 05/16/2015  . Gastro-esophageal reflux disease without esophagitis 05/16/2015  . Generalized headache 05/16/2015  . Right-sided Bell's palsy 05/16/2015  . H/O: depression 05/16/2015  . Hypertriglyceridemia 05/16/2015  . Blood glucose elevated 05/16/2015  . Irregular menstrual cycle 05/16/2015  . Overweight 05/16/2015  . Paresthesia 05/16/2015  . Disorder of labyrinth 05/16/2015  . Dyspnea 10/07/2014  .  Moderate persistent intrinsic asthma without status asthmaticus without complication 22/97/9892   Willa Rough DPT, ATC Willa Rough 02/02/2018, 11:59 AM  [place] MAIN [facility] SERVICES [place], [place], [address] Phone: [phone]   Fax:  [phone]  Name: Yesenia Wood MRN: [id number] Date of Birth: October 29, 1986

## 2018-02-04 ENCOUNTER — Encounter: Payer: Self-pay | Admitting: Cardiovascular Disease

## 2018-02-04 ENCOUNTER — Encounter: Payer: Self-pay | Admitting: Family Medicine

## 2018-02-05 ENCOUNTER — Ambulatory Visit: Payer: Medicare Other

## 2018-02-05 DIAGNOSIS — M62838 Other muscle spasm: Secondary | ICD-10-CM

## 2018-02-05 DIAGNOSIS — M629 Disorder of muscle, unspecified: Secondary | ICD-10-CM | POA: Diagnosis not present

## 2018-02-05 DIAGNOSIS — R293 Abnormal posture: Secondary | ICD-10-CM | POA: Diagnosis not present

## 2018-02-05 DIAGNOSIS — M791 Myalgia, unspecified site: Secondary | ICD-10-CM

## 2018-02-05 DIAGNOSIS — M6289 Other specified disorders of muscle: Secondary | ICD-10-CM

## 2018-02-05 NOTE — Patient Instructions (Signed)
Bracing With Arm / Leg Raise (Quadruped)    On hands and knees find neutral spine. Tighten pelvic floor and abdominals and hold. Alternating, lift arm to shoulder level and opposite leg to hip level. Repeat __5_ times through each extremity. Do __1_ times a day.

## 2018-02-05 NOTE — Therapy (Signed)
[place] MAIN [facility] SERVICES [address] [place], [place], [address] Phone: [phone]   Fax:  [phone]  Physical Therapy Treatment  Patient Details  Name: Yesenia Wood MRN: [id number] Date of Birth: [birth year] Referring Provider: Rubie Maid   Encounter Date: 02/05/2018  PT End of Session - 02/05/18 1833    Visit Number  7    Number of Visits  10    Date for PT Re-Evaluation  02/19/18    Authorization Type  Medicare    Authorization - Visit Number  7    Authorization - Number of Visits  10    PT Start Time  0935    PT Stop Time  1035    PT Time Calculation (min)  60 min    Activity Tolerance  Patient tolerated treatment well    Behavior During Therapy  [facility] for tasks assessed/performed       Past Medical History:  Diagnosis Date  . B12 deficiency   . Bell's palsy    right sided  . Chronic migraine   . Deaf   . 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 Fatigue   . Fatty liver   . Fibromyalgia   . GERD (gastroesophageal reflux disease)   . Headache   . High triglycerides   . Hyperglycemia   . Hypertension   . Moderate asthma   . Obesity   . Paresthesia   . Premature birth     Past Surgical History:  Procedure Laterality Date  . BARTHOLIN GLAND CYST REMOVAL    . COCHLEAR IMPLANT    . COCHLEAR IMPLANT REMOVAL    . LAPAROSCOPY    . MYRINGOTOMY WITH TUBE PLACEMENT      There were no vitals filed for this visit.   Pelvic Floor Physical Therapy Treatment Note  SCREENING  Changes in medications, allergies, or medical history?: yes, added Zoloft 25 mg, increase to 50 mg.     SUBJECTIVE  Patient reports: Recently found out that her HDL is low and LDL is high even though she eats mostly fruits, vegetables, and chicken. She Is doing her exercises a little more than she was and is feeling a little better than she did on Thursday, both emotionally and  physically.  Pain update:  Location of pain: low back, pelvis Current pain:  4/10  Max pain:  4/10 Least pain:  3/10 Nature of pain: ache  Patient Goals: Be able to walk comfortably and do activities around the house without pain.   OBJECTIVE  Changes in: Posture/Observations:  Patient is walking slightly taller and looks happier today than at last visit 4 days prior.  Palpation: Pt. TTP through proximal adductors and glute med on R, and through distal adductors and medial quads on L. Decreased TTP following MFR and TP release.  Gait Analysis: Patient demonstrating more erect posture with less foot dragging than at prior visits without cueing.  INTERVENTIONS THIS SESSION: Manual: MFR with use of edge tool to R lateral hip andadductors as well as L medial quads and adductors, also performed TP release to L adductors more medially and L adductors more distally to decrease muscular tension acting on the pelvis and referred pain into the pelvis. NM re-ed: Educated on and practiced bird-dog in quadruped to improve use of TA for stability with dynamic motion. Strengthening of core musculature will help the PFM be able to relax and put less tension on the  nerves that are causing heightened pain.   Total time: 60 min.                          PT Education - 02/05/18 1832    Education provided  Yes    Education Details  See Pt. Instructions and interventions this session     Person(s) Educated  Patient    Methods  Explanation;Demonstration;Tactile cues;Verbal cues;Handout via translator    Comprehension  Verbalized understanding;Returned demonstration;Verbal cues required;Tactile cues required via translator       PT Short Term Goals - 01/15/18 1135      PT SHORT TERM GOAL #1   Title  Patient will demonstrate improved pelvic allignment in standing and supine for improved pelvic position for PFM length/tension relationship.    Time  5    Period  Weeks     Status  Achieved    Target Date  01/15/18      PT SHORT TERM GOAL #2   Title  Patient will undergo Internal/External assessment of the PFM to allow or direct treatment of PFM spasms.    Time  5    Period  Weeks    Status  Not Met    Target Date  01/15/18      PT SHORT TERM GOAL #3   Title  Patient will demonstrate appropriate body mechanics with bending and lifting heavy objects to allow for decreased stress on the pelvic floor and low back.     Time  5    Period  Weeks    Status  On-going    Target Date  01/15/17        PT Long Term Goals - 12/12/17 1203      PT LONG TERM GOAL #1   Title  Patient will score at or below 8/43 on the Female NIH-CPSI and 5 on the VQ to demonstrate a clinically meaningful decrease in disability and distress due to pelvic floor dysfunction.    Baseline  NIH- CPSI: 18/43, VQ: 10/33     Time  10    Period  Weeks    Status  New    Target Date  02/19/18      PT LONG TERM GOAL #2   Title  Patient will demonstrate ability to use a tool for self TP release to allow for maintenence of spasm release upon D/C.    Time  10    Period  Weeks    Status  New    Target Date  02/19/18      PT LONG TERM GOAL #3   Title  Patient will report a reduction in pain to no greater than 2/10 over the prior week to demonstrate symptom improvement.    Time  10    Period  Weeks    Status  New    Target Date  02/19/18      PT LONG TERM GOAL #4   Title  Patient will demonstrate improved sitting and standing posture to demonstrate learning and decrease stress on the pelvic floor with functional activity.    Time  10    Period  Weeks    Status  New    Target Date  02/19/18            Plan - 02/05/18 1825    Clinical Impression Statement  Patient has responded well to new medication and shown improved motivation to perform her HEP ressulting in less pain  and improved posture since prior visit. She even brought shorts with her today which has taken several visits  to achieve. She responded well to manual therapy today, demonstrating decreased TTP following treatment. Continue per POC.    History and Personal Factors relevant to plan of care:  deaf, multiple co-morbidities    Clinical Presentation  Evolving    Clinical Presentation due to:  Patient has just started new anti-depression medication which seems to be helping her be able to participate better at home and in the clinic but it is too soon to tell if this will be a pattern.    Clinical Decision Making  High    Rehab Potential  Fair    Clinical Impairments Affecting Rehab Potential  Deaf, multiple co-morbidities, fibromyalgia    PT Frequency  1x / week    PT Treatment/Interventions  ADLs/Self Care Home Management;Aquatic Therapy;Biofeedback;Moist Heat;Traction;Ultrasound;Balance training;Gait training;Functional mobility training;Therapeutic activities;Therapeutic exercise;Neuromuscular re-education;Patient/family education;Orthotic Fit/Training;Scar mobilization;Manual techniques;Manual lymph drainage;Dry needling;Taping    PT Next Visit Plan  review all of HEP , add pelvic tilts and squats    PT Home Exercise Plan  diaphragmatic breathing, TA in quadruped, child's pose, adductor stretch, piriformis stretch, squatty potty, L side stretch, hip abduction, tall posture walking.  Bird-dog    Recommended Other Services  cool pool exercise, aquatic PT?    Consulted and Agree with Plan of Care  Patient       Patient will benefit from skilled therapeutic intervention in order to improve the following deficits and impairments:  Dizziness, Increased fascial restricitons, Impaired sensation, Improper body mechanics, Pain, Decreased coordination, Hypermobility, Postural dysfunction, Increased muscle spasms, Impaired tone, Decreased balance, Difficulty walking  Visit Diagnosis: Myalgia  Other muscle spasm  Abnormal posture  Muscular imbalance     Problem List Patient Active Problem List   Diagnosis  Date Noted  . OSA (obstructive sleep apnea) 10/10/2016  . Other insomnia 10/10/2016  . 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 Sleep disturbance 05/10/2016  . Chronic migraine without aura 03/29/2016  . Costochondral chest pain 12/28/2015  . Pain in the chest 06/09/2015  . Palpitations 06/09/2015  . Deaf 05/16/2015  . B12 deficiency 05/16/2015  . Cochlear implant status 05/16/2015  . Fatty infiltration of liver 05/16/2015  . Fibromyalgia syndrome 05/16/2015  . Gastro-esophageal reflux disease without esophagitis 05/16/2015  . Generalized headache 05/16/2015  . Right-sided Bell's palsy 05/16/2015  . H/O: depression 05/16/2015  . Hypertriglyceridemia 05/16/2015  . Blood glucose elevated 05/16/2015  . Irregular menstrual cycle 05/16/2015  . Overweight 05/16/2015  . Paresthesia 05/16/2015  . Disorder of labyrinth 05/16/2015  . Dyspnea 10/07/2014  . Moderate persistent intrinsic asthma without status asthmaticus without complication 64/38/3779   Willa Rough DPT, ATC Willa Rough 02/05/2018, 6:35 PM  [place] MAIN [facility] SERVICES [address] [place], [place], [address] Phone: [phone]   Fax:  [phone]  Name: Yesenia Wood MRN: [id number] Date of Birth: [birth year]

## 2018-02-06 ENCOUNTER — Telehealth: Payer: Self-pay | Admitting: Cardiovascular Disease

## 2018-02-06 ENCOUNTER — Other Ambulatory Visit: Payer: Self-pay

## 2018-02-06 MED ORDER — DILTIAZEM HCL ER COATED BEADS 120 MG PO CP24: 120.0000 mg | ORAL_CAPSULE | Freq: Every day | ORAL | 3 refills | Status: DC

## 2018-02-06 MED ORDER — METOPROLOL SUCCINATE ER 25 MG PO TB24: 25.0000 mg | ORAL_TABLET | Freq: Every day | ORAL | 3 refills | Status: DC

## 2018-02-06 NOTE — Telephone Encounter (Signed)
Pt (and interpreter) calling she states that she had labs done recently at her PCP  And  She has some concerns, she states they are wanting to place her on new medications  But would like our advise first.

## 2018-02-06 NOTE — Telephone Encounter (Signed)
Called patient. She got message from MyChart about changing diltiazem and adding metoprolol. Her PCP wants to prescribe her a cholesterol medication. She is concerned about taking a cholesterol medication with her other heart medications. Advised that it is ok to take with cardiac medications with a cholesterol medications if PCP advised to do so. She was appreciative for the call and will contact her PCP.

## 2018-02-06 NOTE — Telephone Encounter (Signed)
Caller Name: [name], self thru translator Phone: [phone] Pharmacy:  [id number] [place] - [address] [phone] (Phone) [phone])   Pt called in and agrees to take the chol med advised by Dr. Carlynn PurlSowles after seeing Dr. Kirke CorinArida. Please send to pharmacy.

## 2018-02-15 ENCOUNTER — Encounter: Payer: Self-pay | Admitting: Family Medicine

## 2018-02-17 ENCOUNTER — Other Ambulatory Visit: Payer: Self-pay | Admitting: Family Medicine

## 2018-02-17 DIAGNOSIS — R109 Unspecified abdominal pain: Secondary | ICD-10-CM

## 2018-02-17 DIAGNOSIS — K219 Gastro-esophageal reflux disease without esophagitis: Secondary | ICD-10-CM

## 2018-02-17 DIAGNOSIS — J029 Acute pharyngitis, unspecified: Secondary | ICD-10-CM

## 2018-02-17 NOTE — Telephone Encounter (Signed)
Pantoprazole is not on current med list, refill denied.

## 2018-02-19 ENCOUNTER — Ambulatory Visit: Payer: Medicare Other

## 2018-02-19 DIAGNOSIS — R293 Abnormal posture: Secondary | ICD-10-CM | POA: Diagnosis not present

## 2018-02-19 DIAGNOSIS — M62838 Other muscle spasm: Secondary | ICD-10-CM

## 2018-02-19 DIAGNOSIS — M791 Myalgia, unspecified site: Secondary | ICD-10-CM

## 2018-02-19 DIAGNOSIS — M629 Disorder of muscle, unspecified: Secondary | ICD-10-CM | POA: Diagnosis not present

## 2018-02-19 DIAGNOSIS — M6289 Other specified disorders of muscle: Secondary | ICD-10-CM

## 2018-02-19 NOTE — Therapy (Signed)
[place] MAIN [facility] SERVICES [address] [place], [place], [address] Phone: [phone]   Fax:  [phone]  Physical Therapy Treatment and Re-assessment  Patient Details  Name: Yesenia Wood MRN: [id number] Date of Birth: 01/23/1986 Referring Provider: Rubie Maid   Encounter Date: 02/19/2018  PT End of Session - 02/19/18 0947    Visit Number  8    Number of Visits  10    Date for PT Re-Evaluation  02/19/18    Authorization Type  Medicare    Authorization - Visit Number  8    Authorization - Number of Visits  10    PT Start Time  0930    PT Stop Time  1040    PT Time Calculation (min)  70 min    Activity Tolerance  Patient tolerated treatment well    Behavior During Therapy  [facility] for tasks assessed/performed       Past Medical History:  Diagnosis Date  . B12 deficiency   . Bell's palsy    right sided  . Chronic migraine   . Deaf   . 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 Fatigue   . Fatty liver   . Fibromyalgia   . GERD (gastroesophageal reflux disease)   . Headache   . High triglycerides   . Hyperglycemia   . Hypertension   . Moderate asthma   . Obesity   . Paresthesia   . Premature birth     Past Surgical History:  Procedure Laterality Date  . BARTHOLIN GLAND CYST REMOVAL    . COCHLEAR IMPLANT    . COCHLEAR IMPLANT REMOVAL    . LAPAROSCOPY    . MYRINGOTOMY WITH TUBE PLACEMENT      There were no vitals filed for this visit.    Pelvic Floor Physical Therapy Treatment Note and Re-Assessment  SCREENING  Changes in medications, allergies, or medical history?: no   SUBJECTIVE  Patient reports: "I feel like it is getting better, it is not as often as it was before, or as serious, I can sit down more without discomfort."  Precautions:  deafness  Pain update:  Location of pain: lower back and outer bhips Current pain:  4/10  Max pain: 4/10 Least  pain:  4/10 Nature of pain:   Patient Goals: Be able to walk comfortably and do activities around the house without pain.   OBJECTIVE  Changes in: Posture/Observations:  Patient demonstrates improved posture in sitting, standing, and walking. 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   INTERVENTIONS THIS SESSION: Self-care: Educated on doing self-exploration to become more comfortable with her own anatomy, how to perform self external TP release to superficial PFM, as well as how we could teach her how to do internal TP release with as much modesty as possible so she could treat these muscles directly as well. Discussed progress and POC moving forward, emphasizing the importance that she be more consistent with her body mechanics and HEP to continue making progress. NM re-ed: Educated on and practiced multi-directional stepping in the mirror and mini-marches with moderate to maximal cueing visually, verbally, and tactile. Pt. Was able to demonstrate good form at least 3 times sequentially in every direction for both exercises after multiple attempts.  Total time: 70 min.  No data recorded               PT Education - 02/19/18 0946    Education provided  Yes    Education Details  See Pt. Insttructions and interventions this session.    Person(s) Educated  Patient    Methods  Explanation via interpreter    Comprehension  Verbalized understanding via interpreter       PT Short Term Goals - 02/19/18 0839      PT SHORT TERM GOAL #1   Title  Patient will demonstrate improved pelvic allignment in standing and supine for improved pelvic position for PFM length/tension relationship.    Time  5    Period  Weeks    Status  Achieved    Target Date  01/15/18      PT SHORT TERM GOAL #2   Title  Patient will undergo Internal/External assessment of the PFM to allow  or direct treatment of PFM spasms.    Time  5    Period  Weeks    Status  Not Met    Target Date  01/15/18      PT SHORT TERM GOAL #3   Title  Patient will demonstrate appropriate body mechanics with bending and lifting heavy objects to allow for decreased stress on the pelvic floor and low back.     Time  5    Period  Weeks    Status  Achieved    Target Date  01/15/18      PT SHORT TERM GOAL #4   Title  Patient will perform self external TP release to decrease spasm and sensitivity of nerves to allow for ecreased pain.    Time  4    Period  Weeks    Status  New    Target Date  03/19/18      PT SHORT TERM GOAL #5   Title  Patient will describe Max pain of no greater than 3/10 over prior week to demonstrate symptom improvement and improved quality of life.    Time  4    Period  Weeks    Status  New    Target Date  03/19/18        PT Long Term Goals - 02/19/18 0839      PT LONG TERM GOAL #1   Title  Patient will score at or below 8/43 on the Female NIH-CPSI and 5 on the VQ to demonstrate a clinically meaningful decrease in disability and distress due to pelvic floor dysfunction.    Baseline  NIH- CPSI: 18/43, VQ: 10/33     Time  10    Period  Weeks    Status  On-going    Target Date  04/30/18      PT LONG TERM GOAL #2   Title  Patient will demonstrate ability to use a tool for self TP release to allow for maintenence of spasm release upon D/C.    Time  10    Period  Weeks    Status  Not Met    Target Date  04/30/18      PT LONG TERM GOAL #3   Title  Patient will report a reduction in pain to no greater than 2/10 over the prior week to demonstrate symptom improvement.    Time  10    Period  Weeks    Status  On-going    Target Date  04/30/18      PT LONG TERM GOAL #  4   Title  Patient will demonstrate improved sitting and standing posture to demonstrate learning and decrease stress on the pelvic floor with functional activity.    Time  10    Period  Weeks     Status  Achieved    Target Date  02/19/18      PT LONG TERM GOAL #5   Title  Patient will perform regular exercise over the prior 2 weeks to demonstrate improved QOL and ability to maintain symptom improvement upon D/C    Time  10    Period  Weeks    Status  New    Target Date  04/30/18            Plan - 02/19/18 0948    Clinical Impression Statement  Patient is making progress with PT and has demonstrated a reduction in maximum pain from 8/10 to 4/10 (50%) that has been consistent over the past 2 weeks with a slow gradual trend toward decrease over the course of her therapy. The patient continues to be uncomfortable with internal assessment or treatment wwhich has limited the rate of progress of pain resolution along with her struggles with depression and anxiety to date. She has, however, demonstrated a considerable improvement in her adherance to her HEP and her pain levels snce she started taking a new anti-depressant and the trend is expected to continue as she goes longer with this aspect of her health being better managed. Today she demonstrated the most attentive and enthusiastic attitude to date and Ithis should translate to continued improvement. The patient has met some of her goals, some are ongoing, and those about an internal assessment are likely to never be met but an attempt was made today to help patient become more comfortable with what she can do at home to help decrease some of the spasms through the external PFM. The new plan is to see her every-other week for 5 visits over 10 weeks to allow her more time between visits to work on her exercises and self-treatment as well as to continue to see how her long-term use of the anti-depressant helps her while keeping her on-track, adjusting her HEP as needed, and performing manual treatment when necessary to facilitate continued strengthening in appropriate pelvic alignment.     History and Personal Factors relevant to plan of care:   deaf, multiple co-morbidities.    Clinical Presentation  Evolving    Clinical Presentation due to:  Patient has done better over the prior two weeks since beginning her new anti-depressant but the test will be to see what happens when she has her period and as she becomes accustomed to the new medication.    Clinical Decision Making  High    Rehab Potential  Fair    Clinical Impairments Affecting Rehab Potential  Deaf, multiple co-morbidities, fibromyalgia    PT Frequency  Other (comment) every-other week    PT Duration  Other (comment) 10 weeks    PT Treatment/Interventions  ADLs/Self Care Home Management;Aquatic Therapy;Biofeedback;Moist Heat;Traction;Ultrasound;Balance training;Gait training;Functional mobility training;Therapeutic activities;Therapeutic exercise;Neuromuscular re-education;Patient/family education;Orthotic Fit/Training;Scar mobilization;Manual techniques;Manual lymph drainage;Dry needling;Taping    PT Next Visit Plan  Ask about home TP release success, add pelvic tilts and squats re-check allignment.    PT Home Exercise Plan  diaphragmatic breathing, TA in quadruped, child's pose, adductor stretch, piriformis stretch, squatty potty, L side stretch, hip abduction, tall posture walking.  Bird-dog, multi-stepping, mini-marches    Recommended Other Services  discuss pool access and aquatic  PT/exercise plan    Consulted and Agree with Plan of Care  Patient       Patient will benefit from skilled therapeutic intervention in order to improve the following deficits and impairments:  Dizziness, Increased fascial restricitons, Impaired sensation, Improper body mechanics, Pain, Decreased coordination, Hypermobility, Postural dysfunction, Increased muscle spasms, Impaired tone, Decreased balance, Difficulty walking  Visit Diagnosis: Myalgia  Other muscle spasm  Abnormal posture  Muscular imbalance     Problem List Patient Active Problem List   Diagnosis Date Noted  . OSA  (obstructive sleep apnea) 10/10/2016  . Other insomnia 10/10/2016  . 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 Sleep disturbance 05/10/2016  . Chronic migraine without aura 03/29/2016  . Costochondral chest pain 12/28/2015  . Pain in the chest 06/09/2015  . Palpitations 06/09/2015  . Deaf 05/16/2015  . B12 deficiency 05/16/2015  . Cochlear implant status 05/16/2015  . Fatty infiltration of liver 05/16/2015  . Fibromyalgia syndrome 05/16/2015  . Gastro-esophageal reflux disease without esophagitis 05/16/2015  . Generalized headache 05/16/2015  . Right-sided Bell's palsy 05/16/2015  . H/O: depression 05/16/2015  . Hypertriglyceridemia 05/16/2015  . Blood glucose elevated 05/16/2015  . Irregular menstrual cycle 05/16/2015  . Overweight 05/16/2015  . Paresthesia 05/16/2015  . Disorder of labyrinth 05/16/2015  . Dyspnea 10/07/2014  . Moderate persistent intrinsic asthma without status asthmaticus without complication 62/95/2841   Willa Rough DPT, ATC Willa Rough 02/19/2018, 10:33 AM  [place] MAIN [facility] SERVICES [place], [place], [address] Phone: [phone]   Fax:  [phone]  Name: Yesenia Wood MRN: [id number] Date of Birth: 06/27/1986

## 2018-02-19 NOTE — Patient Instructions (Addendum)
  Female version: Bgee Classic (not plus!)   Female version: Dr. Alonna MiniumJoel Kaplan Premium Prostate Massager     Self Internal Trigger Point Relief    1) Wash your hands and prop yourself up in a way where you can easily reach the vagina. You may wish to have a small hand-held mirror near by.  2) lubricate the tool and vaginal opening using a hypoallergenic lubricant such as "slippery-stuff".   3) Slowly and gently insert the tool into the vagina using deep breaths to allow relaxation of the muscles around the tool.  4) Avoiding the "12 o-clock" region near the urethra, gently use the handle of the tool like a lever to press the angled tip of the tool onto the wall of the pelvic floor.   5) Move the tool to different areas of the pelvic floor and feel for areas that are tender called "trigger points". When you find one hold the tool still, applying just enough pressure to elicit mild discomfort and take deep belly breaths until the discomfort subsides or decreases by at least 50%.   6) Repeat the process for any trigger points you find spending between 3-10 minutes on this per night until you do not find any more trigger points or you are told otherwise by your therapist..  Mini-Marches    Exhale, drawing the lower tummy (TA) in toward the back bone and hold contraction while you lift one foot ~ 2 inches off the mat, then the other foot before relaxing and resetting. Try to keep your hips from rocking, using your hands to sense whether they are staying even as pictured.      Perform _10_ repetitions for _2__ sets. Do this _1-2_ times per day.           Stand with equal weight on both feet engage the pelvic floor and lower abdomen and then lunge with the same leg 5 times in each direction, keeping foot forward and balancing for a second before placing the foot down between repetitions.  _2-3__ reps __1_ times per day.       *When doing your external trigger point release, find  the muscles externally using a hand-held mirror. Make sure that you are comfortably propped-up before beginning and that you hold gentle pressure that causes slight discomfort but not intense pain while breathing deeply and allowing the pain to gradually decrease. As it goes away, press a little firmer to make sure the whole muscle has released.

## 2018-02-20 ENCOUNTER — Encounter: Payer: Self-pay | Admitting: Obstetrics and Gynecology

## 2018-02-20 ENCOUNTER — Ambulatory Visit (INDEPENDENT_AMBULATORY_CARE_PROVIDER_SITE_OTHER): Payer: Medicare Other | Admitting: Obstetrics and Gynecology

## 2018-02-20 VITALS — BP 109/70 | HR 71 | Ht 63.0 in | Wt 152.4 lb

## 2018-02-20 DIAGNOSIS — Z308 Encounter for other contraceptive management: Secondary | ICD-10-CM | POA: Diagnosis not present

## 2018-02-20 DIAGNOSIS — R102 Pelvic and perineal pain: Secondary | ICD-10-CM | POA: Diagnosis not present

## 2018-02-20 DIAGNOSIS — N920 Excessive and frequent menstruation with regular cycle: Secondary | ICD-10-CM

## 2018-02-20 NOTE — Progress Notes (Signed)
Pt is doing well and she has her sign language interpreter with her.

## 2018-02-20 NOTE — Patient Instructions (Signed)
Laparoscopic Tubal Ligation Laparoscopic tubal ligation is a procedure to close the fallopian tubes. This is done so that you cannot get pregnant. When the fallopian tubes are closed, the eggs that your ovaries release cannot enter the uterus, and sperm cannot reach the released eggs. A laparoscopic tubal ligation is sometimes called "getting your tubes tied." You should not have this procedure if you want to get pregnant someday or if you are unsure about having more children. Tell a health care provider about:  Any allergies you have.  All medicines you are taking, including vitamins, herbs, eye drops, creams, and over-the-counter medicines.  Any problems you or family members have had with anesthetic medicines.  Any blood disorders you have.  Any surgeries you have had.  Any medical conditions you have.  Whether you are pregnant or may be pregnant.  Any past pregnancies. What are the risks? Generally, this is a safe procedure. However, problems may occur, including:  Infection.  Bleeding.  Injury to surrounding organs.  Side effects from anesthetics.  Failure of the procedure.  This procedure can increase your risk of a kind of pregnancy in which a fertilized egg attaches to the outside of the uterus (ectopic pregnancy). What happens before the procedure?  Ask your health care provider about: ? Changing or stopping your regular medicines. This is especially important if you are taking diabetes medicines or blood thinners. ? Taking medicines such as aspirin and ibuprofen. These medicines can thin your blood. Do not take these medicines before your procedure if your health care provider instructs you not to.  Follow instructions from your health care provider about eating and drinking restrictions.  Plan to have someone take you home after the procedure.  If you go home right after the procedure, plan to have someone with you for 24 hours. What happens during the  procedure?  You will be given one or more of the following: ? A medicine to help you relax (sedative). ? A medicine to numb the area (local anesthetic). ? A medicine to make you fall asleep (general anesthetic). ? A medicine that is injected into an area of your body to numb everything below the injection site (regional anesthetic).  An IV tube will be inserted into one of your veins. It will be used to give you medicines and fluids during the procedure.  Your bladder may be emptied with a small tube (catheter).  If you have been given a general anesthetic, a tube will be put down your throat to help you breathe.  Two small cuts (incisions) will be made in your lower abdomen and near your belly button.  Your abdomen will be inflated with a gas. This will let the surgeon see better and will give the surgeon room to work.  A thin, lighted tube (laparoscope) with a camera attached will be inserted into your abdomen through one of the incisions. Small instruments will be inserted through the other incision.  The fallopian tubes will be tied off, burned (cauterized), or blocked with a clip, ring, or clamp. A small portion in the center of each fallopian tube may be removed.  The gas will be released from the abdomen.  The incisions will be closed with stitches (sutures).  A bandage (dressing) will be placed over the incisions. The procedure may vary among health care providers and hospitals. What happens after the procedure?  Your blood pressure, heart rate, breathing rate, and blood oxygen level will be monitored often until the medicines you   were given have worn off.  You will be given medicine to help with pain, nausea, and vomiting as needed. This information is not intended to replace advice given to you by your health care provider. Make sure you discuss any questions you have with your health care provider. Document Released: 02/20/2001 Document Revised: 04/21/2016 Document  Reviewed: 10/25/2015 Elsevier Interactive Patient Education  2018 Elsevier Inc.  

## 2018-02-20 NOTE — Progress Notes (Signed)
    GYNECOLOGY PROGRESS NOTE  Subjective:    Patient ID: Yesenia ShoeLaurie J Rocks, female    DOB: 05/01/1986, 32 y.o.   MRN: [id number]  HPI  Patient is a 32 y.o. G0P0 deaf female who presents for 3 month f/u of heavy menses, pelvic pain, desires for permanent sterilization. Last visit patient was changed from Junel to Seasonale to help with pelvic pain and menses.  Patient notes that most recent cycle was less painful and much lighter, although she still did pass a few clots.  Was also referred to pelvic floor physical therapy which she reports helped "some".    Patient notes that she still also desires to have a tubal ligation. Patient reports that she has known since childhood that she did not desire to bear children.  She notes that if she ever does decide on having a family she would prefer to adopt.  Has been denied in the past for the surgery due to her younger age.   The following portions of the patient's history were reviewed and updated as appropriate: allergies, current medications, past family history, past medical history, past social history, past surgical history and problem list.  Review of Systems Pertinent items noted in HPI and remainder of comprehensive ROS otherwise negative.   Objective:   Blood pressure 109/70, pulse 71, height 5\' 3"  (1.6 m), weight 152 lb 6.4 oz (69.1 kg), last menstrual period 02/09/2018. General appearance: alert and no distress Exam deferred.    Assessment:   Heavy menses Pelvic pain Desires permanent sterilization  Plan:   1. Heavy menses and to some degree patient's pelvic pain has improved. Continue new contraceptive Seasonale.  Can use Tylenol or NSAIDs for residual pain.  Can continue physical therapy as well.  2. Desire for permanent sterilization.  Several discussions have been had with patient regarding her desires for no fertility.  She has been on other methods of birth control including Mirena IUD (although this was removed after 2 years  due to patient's belief that this was causing her pelvic pain). Other reversible forms of contraception were again discussed with patient; she declines all other modalities. Risks of procedure discussed with patient including but not limited to: risk of regret, permanence of method, bleeding, infection, injury to surrounding organs and need for additional procedures.  Failure risk of 1-2 % with increased risk of ectopic gestation if pregnancy occurs was also discussed with patient.  Patient verbalized understanding of these risks and wants to proceed with sterilization.  Will schedule surgery for 03/26/2018.  At this time, during laparoscopy can also be used to assess pelvis for any other identifiable causes of pelvic pain (such as endometriosis).  Will return in 4 weeks for pre-op.    A total of 15 minutes were spent face-to-face with the patient during this encounter and over half of that time dealt with counseling and coordination of care.   Hildred Laserherry, Laelle Bridgett, MD [facility]

## 2018-03-05 ENCOUNTER — Ambulatory Visit: Payer: Medicare Other | Attending: Obstetrics and Gynecology

## 2018-03-05 DIAGNOSIS — M629 Disorder of muscle, unspecified: Secondary | ICD-10-CM | POA: Insufficient documentation

## 2018-03-05 DIAGNOSIS — M791 Myalgia, unspecified site: Secondary | ICD-10-CM

## 2018-03-05 DIAGNOSIS — R293 Abnormal posture: Secondary | ICD-10-CM | POA: Diagnosis not present

## 2018-03-05 DIAGNOSIS — M6289 Other specified disorders of muscle: Secondary | ICD-10-CM

## 2018-03-05 DIAGNOSIS — M62838 Other muscle spasm: Secondary | ICD-10-CM

## 2018-03-05 NOTE — Therapy (Signed)
[place] MAIN [facility] SERVICES [address] [place], [place], [address] Phone: [phone]   Fax:  [phone]  Physical Therapy Treatment  Patient Details  Name: Yesenia Wood MRN: [id number] Date of Birth: 06/13/1986 Referring Provider: Rubie Maid   Encounter Date: 03/05/2018  PT End of Session - 03/05/18 2150    Visit Number  9    Number of Visits  14    Date for PT Re-Evaluation  02/19/18    Authorization Type  Medicare    Authorization - Visit Number  4    Authorization - Number of Visits  14    PT Start Time  0932    PT Stop Time  1140    PT Time Calculation (min)  65 min    Activity Tolerance  Patient tolerated treatment well    Behavior During Therapy  [facility] for tasks assessed/performed       Past Medical History:  Diagnosis Date  . B12 deficiency   . Bell's palsy    right sided  . Chronic migraine   . Deaf   . Depression   . 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 Fatigue   . Fatty liver   . Fibromyalgia   . GERD (gastroesophageal reflux disease)   . Headache   . High triglycerides   . Hyperglycemia   . Hypertension   . Moderate asthma   . Obesity   . Paresthesia   . Premature birth     Past Surgical History:  Procedure Laterality Date  . BARTHOLIN GLAND CYST REMOVAL    . COCHLEAR IMPLANT    . COCHLEAR IMPLANT REMOVAL    . LAPAROSCOPY    . MYRINGOTOMY WITH TUBE PLACEMENT      There were no vitals filed for this visit.   Pelvic Floor Physical Therapy Treatment Note  SCREENING  Changes in medications, allergies, or medical history?: no     SUBJECTIVE  Patient reports: She is going to have a tubal ligation on April 29th.   Pain update:  Location of pain:  Current pain:  1/10  Max pain:  2/10 Least pain:  2/10 Nature of pain: cramp.  Patient Goals: Be able to walk comfortably and do activities around the house without  pain.    OBJECTIVE  Changes in:  Range of Motion/Flexibilty:  Decreased mobility and TTP thorough the lumbar and lower thoracic spine with PA mobs.  Palpation: TTP to L obliques and TA  Gait Analysis: Pt. Is walking taller, with no foot-dragging   INTERVENTIONS THIS SESSION: Manual: TP release to L obliques and QL and grade 3-4 PA mobs to lumbo-thoracic junction to improve mobility and decrease pain as well as allow for improved posture. THerex: educated on and practiced prone press-ups for improved lumbar mobility and decreased pain with emphasis on not engaging lumbar extensors.  Self-care: Discussed with the patient the potential that her pain will flare-up following her tubal ligation surgery and emphasized the importance of resting and then gradually increasing activity following the surgery to allow for healing and decrease the potential for increased pain.   Total time: 65 min.                           PT Education - 03/05/18 2149    Education provided  Yes    Education Details  See Pt. Instructions and INterventions this session.  Person(s) Educated  Patient    Methods  Explanation via interpreter    Comprehension  Verbalized understanding via interpreter       PT Short Term Goals - 02/19/18 0839      PT SHORT TERM GOAL #1   Title  Patient will demonstrate improved pelvic allignment in standing and supine for improved pelvic position for PFM length/tension relationship.    Time  5    Period  Weeks    Status  Achieved    Target Date  01/15/18      PT SHORT TERM GOAL #2   Title  Patient will undergo Internal/External assessment of the PFM to allow or direct treatment of PFM spasms.    Time  5    Period  Weeks    Status  Not Met    Target Date  01/15/18      PT SHORT TERM GOAL #3   Title  Patient will demonstrate appropriate body mechanics with bending and lifting heavy objects to allow for decreased stress on the pelvic floor and  low back.     Time  5    Period  Weeks    Status  Achieved    Target Date  01/15/18      PT SHORT TERM GOAL #4   Title  Patient will perform self external TP release to decrease spasm and sensitivity of nerves to allow for ecreased pain.    Time  4    Period  Weeks    Status  New    Target Date  03/19/18      PT SHORT TERM GOAL #5   Title  Patient will describe Max pain of no greater than 3/10 over prior week to demonstrate symptom improvement and improved quality of life.    Time  4    Period  Weeks    Status  New    Target Date  03/19/18        PT Long Term Goals - 02/19/18 0839      PT LONG TERM GOAL #1   Title  Patient will score at or below 8/43 on the Female NIH-CPSI and 5 on the VQ to demonstrate a clinically meaningful decrease in disability and distress due to pelvic floor dysfunction.    Baseline  NIH- CPSI: 18/43, VQ: 10/33     Time  10    Period  Weeks    Status  On-going    Target Date  04/30/18      PT LONG TERM GOAL #2   Title  Patient will demonstrate ability to use a tool for self TP release to allow for maintenence of spasm release upon D/C.    Time  10    Period  Weeks    Status  Not Met    Target Date  04/30/18      PT LONG TERM GOAL #3   Title  Patient will report a reduction in pain to no greater than 2/10 over the prior week to demonstrate symptom improvement.    Time  10    Period  Weeks    Status  On-going    Target Date  04/30/18      PT LONG TERM GOAL #4   Title  Patient will demonstrate improved sitting and standing posture to demonstrate learning and decrease stress on the pelvic floor with functional activity.    Time  10    Period  Weeks    Status  Achieved  Target Date  02/19/18      PT LONG TERM GOAL #5   Title  Patient will perform regular exercise over the prior 2 weeks to demonstrate improved QOL and ability to maintain symptom improvement upon D/C    Time  10    Period  Weeks    Status  New    Target Date  04/30/18             Plan - 03/05/18 2151    Clinical Impression Statement  Pt. is continuing to make improvements in pain, demonstrating only 2/10 pain over prior week. She tolerated today's session well with mild increase in pain from mobilizations that should dissipate and create improved mobility in the long-term. Due to POC at every-other week and Pt. having surgery at the end of the month she will not be able to attend all of her sessions but will have 2 following the surgery.    Clinical Presentation  Evolving    Clinical Presentation due to:  Patient continues to improve in pain but is undergoing surgery on April 29th to have tubal ligation.    Clinical Decision Making  High    Clinical Impairments Affecting Rehab Potential  Deaf, multiple co-morbidities, fibromyalgia    PT Frequency  Other (comment) every-other week    PT Duration  Other (comment) 10 weeks    PT Treatment/Interventions  ADLs/Self Care Home Management;Aquatic Therapy;Biofeedback;Moist Heat;Traction;Ultrasound;Balance training;Gait training;Functional mobility training;Therapeutic activities;Therapeutic exercise;Neuromuscular re-education;Patient/family education;Orthotic Fit/Training;Scar mobilization;Manual techniques;Manual lymph drainage;Dry needling;Taping    PT Next Visit Plan  Ask about home TP release success, surgery, add pelvic tilts and squats, educate on scar massage    PT Home Exercise Plan  diaphragmatic breathing, TA in quadruped, child's pose, adductor stretch, piriformis stretch, squatty potty, L side stretch, hip abduction, tall posture walking.  Bird-dog, multi-stepping, mini-marches, prone press-ups.    Consulted and Agree with Plan of Care  Patient       Patient will benefit from skilled therapeutic intervention in order to improve the following deficits and impairments:  Dizziness, Increased fascial restricitons, Impaired sensation, Improper body mechanics, Pain, Decreased coordination, Hypermobility, Postural  dysfunction, Increased muscle spasms, Impaired tone, Decreased balance, Difficulty walking  Visit Diagnosis: Myalgia  Other muscle spasm  Abnormal posture  Muscular imbalance     Problem List Patient Active Problem List   Diagnosis Date Noted  . OSA (obstructive sleep apnea) 10/10/2016  . Other insomnia 10/10/2016  . 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 Sleep disturbance 05/10/2016  . Chronic migraine without aura 03/29/2016  . Costochondral chest pain 12/28/2015  . Pain in the chest 06/09/2015  . Palpitations 06/09/2015  . Deaf 05/16/2015  . B12 deficiency 05/16/2015  . Cochlear implant status 05/16/2015  . Fatty infiltration of liver 05/16/2015  . Fibromyalgia syndrome 05/16/2015  . Gastro-esophageal reflux disease without esophagitis 05/16/2015  . Generalized headache 05/16/2015  . Right-sided Bell's palsy 05/16/2015  . H/O: depression 05/16/2015  . Hypertriglyceridemia 05/16/2015  . Blood glucose elevated 05/16/2015  . Irregular menstrual cycle 05/16/2015  . Overweight 05/16/2015  . Paresthesia 05/16/2015  . Disorder of labyrinth 05/16/2015  . Dyspnea 10/07/2014  . Moderate persistent intrinsic asthma without status asthmaticus without complication 09/73/5329   Willa Rough DPT, ATC Willa Rough 03/05/2018, 9:57 PM  [place] MAIN [facility] SERVICES [address] [place], [place], [address] Phone: [phone]   Fax:  [phone]  Name: Yesenia Wood MRN: [id number] Date of Birth: 1986-07-02

## 2018-03-05 NOTE — Patient Instructions (Signed)
   Do this 10 times, being easy as you start and then on the last on really sink the hips down as you exhale. Do this 1 time per day or to help decrease low back pain.

## 2018-03-19 ENCOUNTER — Encounter: Payer: Self-pay | Admitting: Family Medicine

## 2018-03-19 ENCOUNTER — Ambulatory Visit (INDEPENDENT_AMBULATORY_CARE_PROVIDER_SITE_OTHER): Payer: Medicare Other | Admitting: Family Medicine

## 2018-03-19 VITALS — BP 110/70 | HR 79 | Resp 16 | Ht 63.0 in | Wt 146.4 lb

## 2018-03-19 DIAGNOSIS — M797 Fibromyalgia: Secondary | ICD-10-CM

## 2018-03-19 DIAGNOSIS — R002 Palpitations: Secondary | ICD-10-CM

## 2018-03-19 DIAGNOSIS — E538 Deficiency of other specified B group vitamins: Secondary | ICD-10-CM | POA: Diagnosis not present

## 2018-03-19 DIAGNOSIS — F330 Major depressive disorder, recurrent, mild: Secondary | ICD-10-CM

## 2018-03-19 MED ORDER — CYANOCOBALAMIN 1000 MCG/ML IJ SOLN: 1000.0000 ug | Freq: Once | INTRAMUSCULAR | 0 refills | Status: AC

## 2018-03-19 MED ORDER — CYANOCOBALAMIN 1000 MCG/ML IJ SOLN
1000.0000 ug | Freq: Once | INTRAMUSCULAR | Status: AC
  Administered 2018-03-19: 1000 ug via INTRAMUSCULAR

## 2018-03-19 MED ORDER — SERTRALINE HCL 100 MG PO TABS: 100.0000 mg | ORAL_TABLET | Freq: Every day | ORAL | 0 refills | Status: DC

## 2018-03-19 NOTE — Progress Notes (Addendum)
Name: Yesenia Wood   MRN: [id number]    DOB: 1986-03-02   Date:03/19/2018       Progress Note  Subjective  Chief Complaint  Chief Complaint  Patient presents with  . Follow-up    6 week F/U  . 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 No crying spells. She has lost 9 lbs, and she is not sure why, but she has been eating healthier and has been more active. Getting up around the same time daily and trying to go to bed at the same time - sometimes still has difficulty falling or staying asleep, but does not want medication for that at this time. She denies side effects of medications.   Palpitation and SOB: doing much on metoprolol, helping with palpitation and SOB.   Daily headaches: she has an upcoming follow up with Dr. Neale Burly and advised her to discuss it with him in May  Patient Active Problem List   Diagnosis Date Noted  . OSA (obstructive sleep apnea) 10/10/2016  . Other insomnia 10/10/2016  . Dissocial personality disorder ([facility]) 06/07/2016  . Sleep disturbance 05/10/2016  . Chronic migraine without aura 03/29/2016  . Costochondral chest pain 12/28/2015  . Pain in the chest 06/09/2015  . Palpitations 06/09/2015  . Deaf 05/16/2015  . B12 deficiency 05/16/2015  . Cochlear implant status 05/16/2015  . Fatty infiltration of liver 05/16/2015  . Fibromyalgia syndrome 05/16/2015  . Gastro-esophageal reflux disease without esophagitis 05/16/2015  . Generalized headache 05/16/2015  . Right-sided Bell's palsy 05/16/2015  . H/O: depression 05/16/2015  . Hypertriglyceridemia 05/16/2015  . Blood glucose elevated 05/16/2015  . Irregular menstrual cycle 05/16/2015  . Overweight 05/16/2015  . Paresthesia 05/16/2015  . Disorder of labyrinth 05/16/2015  . Dyspnea 10/07/2014  . Moderate persistent intrinsic asthma  without status asthmaticus without complication 10/07/2014    Past Surgical History:  Procedure Laterality Date  . BARTHOLIN GLAND CYST REMOVAL    . COCHLEAR IMPLANT    . COCHLEAR IMPLANT REMOVAL    . LAPAROSCOPY    . MYRINGOTOMY WITH TUBE PLACEMENT      Family History  Adopted: Yes  Family history unknown: Yes    Social History   Socioeconomic History  . Marital status: Single    Spouse name: Not on file  . Number of children: 0  . Years of education: Not on file  . Highest education level: Some college, no degree  Occupational History  . Occupation: disability for hearing loss  Social Needs  . Financial resource strain: Somewhat hard  . Food insecurity:    Worry: Never true    Inability: Never true  . Transportation needs:    Medical: No    Non-medical: No  Tobacco Use  . Smoking status: Former Smoker    Packs/day: 1.00    Years: 2.00    Pack years: 2.00    Types: Cigarettes    Last attempt to quit: 11/28/2006    Years since quitting: 11.3  . Smokeless tobacco: Never Used  Substance and Sexual Activity  . Alcohol use: Yes    Alcohol/week: 0.0 oz    Comment: rarely  . Drug use: No  . Sexual activity: Not Currently    Birth control/protection: Pill    Comment: Seasonale  Lifestyle  . Physical activity:  Days per week: 7 days    Minutes per session: 150+ min  . Stress: To some extent  Relationships  . Social connections:    Talks on phone: Once a week    Gets together: Never    Attends religious service: Never    Active member of club or organization: No    Attends meetings of clubs or organizations: Never    Relationship status: Never married  . Intimate partner violence:    Fear of current or ex partner: No    Emotionally abused: No    Physically abused: No    Forced sexual activity: No  Other Topics Concern  . Not on file  Social History Narrative   Lives with her adopted  parents   Hearing loss since birth      Current Outpatient  Medications:  .  acetaminophen (TYLENOL) 500 MG tablet, Take 1,000 mg by mouth every 8 (eight) hours as needed for headache., Disp: , Rfl:  .  dicyclomine (BENTYL) 20 MG tablet, Take 1 tablet (20 mg total) by mouth every 6 (six) hours. (Patient taking differently: Take 20 mg by mouth 2 (two) times daily as needed for spasms. ), Disp: 270 tablet, Rfl: 1 .  diltiazem (CARDIZEM CD) 120 MG 24 hr capsule, Take 1 capsule (120 mg total) by mouth daily., Disp: 90 capsule, Rfl: 3 .  levonorgestrel-ethinyl estradiol (SEASONALE,INTROVALE,JOLESSA) 0.15-0.03 MG tablet, Take 1 tablet by mouth daily., Disp: 1 Package, Rfl: 4 .  metoprolol succinate (TOPROL-XL) 25 MG 24 hr tablet, Take 1 tablet (25 mg total) by mouth daily. Take with or immediately following a meal., Disp: 90 tablet, Rfl: 3 .  sertraline (ZOLOFT) 100 MG tablet, Take 1 tablet (100 mg total) by mouth daily., Disp: 90 tablet, Rfl: 0  Allergies  Allergen Reactions  . Budesonide-Formoterol Fumarate Anaphylaxis and Other (See Comments)    Other reaction(s): Other (See Comments) chest pain Chest pain chest pain  . Astelin [Azelastine]     Tingling, numbness, nausea  . Gabapentin Nausea And Vomiting  . Nortriptyline Other (See Comments)    Other reaction(s): Other (See Comments)  . Diclofenac Other (See Comments)    Other reaction(s): Other (See Comments)  . Duloxetine Hcl Other (See Comments)    Other reaction(s): Other (See Comments)  . Montelukast Sodium Other (See Comments)  . Mupirocin Other (See Comments)  . Naproxen Other (See Comments) and Rash  . Naproxen Sodium Rash    mild rash  . Sulfa Antibiotics Hives and Rash     ROS  Constitutional: Negative for fever, positive weight change.  Respiratory: Negative for cough and shortness of breath.   Cardiovascular: Negative for chest pain or palpitations ( doing much better on metoprolol   Gastrointestinal: Negative for abdominal pain, no bowel changes.  Musculoskeletal: Negative for  gait problem or joint swelling.  Skin: Negative for rash.  Neurological: Negative for dizziness, positive for intermittent  headache.  No other specific complaints in a complete review of systems (except as listed in HPI above).  Objective  Vitals:   03/19/18 1120  BP: 110/70  Pulse: 79  Resp: 16  SpO2: 97%  Weight: 146 lb 6.4 oz (66.4 kg)  Height: 5\' 3"  (1.6 m)    Body mass index is 25.93 kg/m.  Physical Exam  Constitutional: Patient appears well-developed and well-nourished. Overweight. No distress.  HEENT: head atraumatic, normocephalic, pupils equal and reactive to light, neck supple, throat within normal limits, she does not speak, and translator was  here with her Cardiovascular: Normal rate, regular rhythm and normal heart sounds.  No murmur heard. No BLE edema. Pulmonary/Chest: Effort normal and breath sounds normal. No respiratory distress. Abdominal: Soft.  There is no tenderness. Psychiatric: Patient has a normal mood and affect. behavior is normal. Judgment and thought content normal.  Recent Results (from the past 2160 hour(s))  HIV antibody     Status: None   Collection Time: 01/29/18 11:36 AM  Result Value Ref Range   HIV 1&2 Ab, 4th Generation NON-REACTIVE NON-REACTI    Comment: HIV-1 antigen and HIV-1/HIV-2 antibodies were not detected. There is no laboratory evidence of HIV infection. Marland Kitchen PLEASE NOTE: This information has been disclosed to you from records whose confidentiality may be protected by state law.  If your state requires such protection, then the state law prohibits you from making any further disclosure of the information without the specific written consent of the person to whom it pertains, or as otherwise permitted by law. A general authorization for the release of medical or other information is NOT sufficient for this purpose. . For additional information please refer to http://education.[facility].com/faq/FAQ106 (This link is  being provided for informational/ educational purposes only.) . Marland Kitchen The performance of this assay has not been clinically validated in patients less than 53 years old. .   COMPLETE METABOLIC PANEL WITH GFR     Status: None   Collection Time: 01/29/18 11:36 AM  Result Value Ref Range   Glucose, Bld 83 65 - 139 mg/dL    Comment: .        Non-fasting reference interval .    BUN 7 7 - 25 mg/dL   Creat 1.61 0.96 - 0.45 mg/dL   GFR, Est Non African American 102 > OR = 60 mL/min/1.51m2   GFR, Est African American 118 > OR = 60 mL/min/1.107m2   BUN/Creatinine Ratio NOT APPLICABLE 6 - 22 (calc)   Sodium 138 135 - 146 mmol/L   Potassium 3.8 3.5 - 5.3 mmol/L   Chloride 104 98 - 110 mmol/L   CO2 25 20 - 32 mmol/L   Calcium 9.5 8.6 - 10.2 mg/dL   Total Protein 7.5 6.1 - 8.1 g/dL   Albumin 4.7 3.6 - 5.1 g/dL   Globulin 2.8 1.9 - 3.7 g/dL (calc)   AG Ratio 1.7 1.0 - 2.5 (calc)   Total Bilirubin 0.6 0.2 - 1.2 mg/dL   Alkaline phosphatase (APISO) 52 33 - 115 U/L   AST 12 10 - 30 U/L   ALT 10 6 - 29 U/L  Hemoglobin A1c     Status: None   Collection Time: 01/29/18 11:36 AM  Result Value Ref Range   Hgb A1c MFr Bld 4.9 <5.7 % of total Hgb    Comment: For the purpose of screening for the presence of diabetes: . <5.7%       Consistent with the absence of diabetes 5.7-6.4%    Consistent with increased risk for diabetes             (prediabetes) > or =6.5%  Consistent with diabetes . This assay result is consistent with a decreased risk of diabetes. . Currently, no consensus exists regarding use of hemoglobin A1c for diagnosis of diabetes in children. . According to American Diabetes Association (ADA) guidelines, hemoglobin A1c <7.0% represents optimal control in non-pregnant diabetic patients. Different metrics may apply to specific patient populations.  Standards of Medical Care in Diabetes(ADA). .    Mean Plasma Glucose 94 (calc)  eAG (mmol/L) 5.2 (calc)  Lipid panel     Status:  Abnormal   Collection Time: 01/29/18 11:36 AM  Result Value Ref Range   Cholesterol 241 (H) <200 mg/dL   HDL 39 (L) >16>50 mg/dL   Triglycerides 109184 (H) <150 mg/dL   LDL Cholesterol (Calc) 168 (H) mg/dL (calc)    Comment: Reference range: <100 . Desirable range <100 mg/dL for primary prevention;   <70 mg/dL for patients with CHD or diabetic patients  with > or = 2 CHD risk factors. Marland Kitchen. LDL-C is now calculated using the Martin-Hopkins  calculation, which is a validated novel method providing  better accuracy than the Friedewald equation in the  estimation of LDL-C.  Horald PollenMartin SS et al. Lenox AhrJAMA. 6045;409(812013;310(19): 2061-2068  (http://education.[facility].com/faq/FAQ164)    Total CHOL/HDL Ratio 6.2 (H) <5.0 (calc)   Non-HDL Cholesterol (Calc) 202 (H) <130 mg/dL (calc)    Comment: For patients with diabetes plus 1 major ASCVD risk  factor, treating to a non-HDL-C goal of <100 mg/dL  (LDL-C of <19<70 mg/dL) is considered a therapeutic  option.   CBC with Differential/Platelet     Status: None   Collection Time: 01/29/18 11:36 AM  Result Value Ref Range   WBC 5.8 3.8 - 10.8 Thousand/uL   RBC 4.75 3.80 - 5.10 Million/uL   Hemoglobin 14.1 11.7 - 15.5 g/dL   HCT 14.742.1 82.935.0 - 56.245.0 %   MCV 88.6 80.0 - 100.0 fL   MCH 29.7 27.0 - 33.0 pg   MCHC 33.5 32.0 - 36.0 g/dL   RDW 13.012.4 86.511.0 - 78.415.0 %   Platelets 355 140 - 400 Thousand/uL   MPV 10.4 7.5 - 12.5 fL   Neutro Abs 2,854 1,500 - 7,800 cells/uL   Lymphs Abs 2,366 850 - 3,900 cells/uL   WBC mixed population 493 200 - 950 cells/uL   Eosinophils Absolute 29 15 - 500 cells/uL   Basophils Absolute 58 0 - 200 cells/uL   Neutrophils Relative % 49.2 %   Total Lymphocyte 40.8 %   Monocytes Relative 8.5 %   Eosinophils Relative 0.5 %   Basophils Relative 1.0 %  TSH     Status: None   Collection Time: 01/29/18 11:36 AM  Result Value Ref Range   TSH 2.84 mIU/L    Comment:           Reference Range .           > or = 20 Years  0.40-4.50 .                 Pregnancy Ranges           First trimester    0.26-2.66           Second trimester   0.55-2.73           Third trimester    0.43-2.91       PHQ2/9: Depression screen [facility] 03/19/2018 01/29/2018 04/11/2017 06/07/2016 03/16/2016  Decreased Interest 1 1 0 0 0  Down, Depressed, Hopeless 1 1 1  0 0  PHQ - 2 Score 2 2 1  0 0  Altered sleeping 3 3 - - -  Tired, decreased energy 1 3 - - -  Change in appetite 0 0 - - -  Feeling bad or failure about yourself  0 0 - - -  Trouble concentrating 1 0 - - -  Moving slowly or fidgety/restless 1 1 - - -  Suicidal thoughts 0 0 - - -  PHQ-9  Score 8 9 - - -  Difficult doing work/chores Not difficult at all Somewhat difficult - - -     Fall Risk: Fall Risk  03/19/2018 10/26/2017 04/11/2017 06/07/2016 03/16/2016  Falls in the past year? No No No No Yes  Number falls in past yr: - - - - 1  Injury with Fall? - - - - Yes  Comment - - - - -  Risk Factor Category  - - - - -     Functional Status Survey: Is the patient deaf or have difficulty hearing?: Yes Does the patient have difficulty seeing, even when wearing glasses/contacts?: No Does the patient have difficulty concentrating, remembering, or making decisions?: No Does the patient have difficulty walking or climbing stairs?: No Does the patient have difficulty dressing or bathing?: No Does the patient have difficulty doing errands alone such as visiting a doctor's office or shopping?: No    Assessment & Plan  1. Depression, major, recurrent, mild (HCC)  - sertraline (ZOLOFT) 100 MG tablet; Take 1 tablet (100 mg total) by mouth daily.  Dispense: 90 tablet; Refill: 0  2. Fibromyalgia syndrome  She feels much better since started on Zoloft   3. Palpitation  Doing well on metoprolol   4. B12 deficiency  - cyanocobalamin (,VITAMIN B-12,) 1000 MCG/ML injection; Inject 1 mL (1,000 mcg total) into the muscle once for 1 dose.  Dispense: 1 mL; Refill: 0

## 2018-03-19 NOTE — Addendum Note (Signed)
Addended by: Alba CorySOWLES, Louan Base F on: 03/19/2018 12:03 PM   Modules accepted: Orders

## 2018-03-19 NOTE — Addendum Note (Signed)
Addended by: Tommie RaymondBOOKER, Annajulia Lewing L on: 03/19/2018 12:22 PM   Modules accepted: Orders

## 2018-03-20 ENCOUNTER — Ambulatory Visit (INDEPENDENT_AMBULATORY_CARE_PROVIDER_SITE_OTHER): Payer: Medicare Other | Admitting: Obstetrics and Gynecology

## 2018-03-20 ENCOUNTER — Encounter
Admission: RE | Admit: 2018-03-20 | Discharge: 2018-03-20 | Disposition: A | Discharge status: Home / Self Care | Payer: Medicare Other | Source: Ambulatory Visit | Attending: Obstetrics and Gynecology | Admitting: Obstetrics and Gynecology

## 2018-03-20 ENCOUNTER — Encounter: Payer: Self-pay | Admitting: Obstetrics and Gynecology

## 2018-03-20 ENCOUNTER — Other Ambulatory Visit: Payer: Self-pay

## 2018-03-20 VITALS — BP 108/73 | HR 68 | Ht 63.0 in | Wt 148.6 lb

## 2018-03-20 DIAGNOSIS — N920 Excessive and frequent menstruation with regular cycle: Secondary | ICD-10-CM

## 2018-03-20 DIAGNOSIS — Z01818 Encounter for other preprocedural examination: Secondary | ICD-10-CM

## 2018-03-20 DIAGNOSIS — Z01812 Encounter for preprocedural laboratory examination: Secondary | ICD-10-CM | POA: Insufficient documentation

## 2018-03-20 DIAGNOSIS — R102 Pelvic and perineal pain: Secondary | ICD-10-CM | POA: Diagnosis not present

## 2018-03-20 HISTORY — DX: Thyrotoxicosis, unspecified without thyrotoxic crisis or storm: E05.90

## 2018-03-20 LAB — BASIC METABOLIC PANEL
Anion gap: 5 (ref 5–15)
BUN: 10 mg/dL (ref 6–20)
CHLORIDE: 106 mmol/L (ref 101–111)
CO2: 24 mmol/L (ref 22–32)
CREATININE: 0.65 mg/dL (ref 0.44–1.00)
Calcium: 8.3 mg/dL — ABNORMAL LOW (ref 8.9–10.3)
GFR calc non Af Amer: >60 mL/min (ref >60)
Glucose, Bld: 94 mg/dL (ref 65–99)
Potassium: 4.3 mmol/L (ref 3.5–5.1)
Sodium: 135 mmol/L (ref 135–145)

## 2018-03-20 LAB — CBC
HEMATOCRIT: 40.1 % (ref 35.0–47.0)
HEMOGLOBIN: 13.7 g/dL (ref 12.0–16.0)
MCH: 30 pg (ref 26.0–34.0)
MCHC: 34.2 g/dL (ref 32.0–36.0)
MCV: 87.8 fL (ref 80.0–100.0)
Platelets: 368 10*3/uL (ref 150–440)
RBC: 4.56 MIL/uL (ref 3.80–5.20)
RDW: 13 % (ref 11.5–14.5)
WBC: 5.6 10*3/uL (ref 3.6–11.0)

## 2018-03-20 NOTE — Patient Instructions (Signed)
  Your procedure is scheduled on: Monday March 26, 2018 Report to Same Day Surgery 2nd floor medical mall (Medical Mall Entrance-take elevator on left to 2nd floor.  Check in with surgery information desk.) To find out your arrival time please call [phone] between 1PM - 3PM on Friday March 23, 2018  Remember: Instructions that are not followed completely may result in serious medical risk, up to and including death, or upon the discretion of your surgeon and anesthesiologist your surgery may need to be rescheduled.    _x___ 1. Do not eat food after midnight the night before your procedure. You may drink clear liquids up to 2 hours before you are scheduled to arrive at the hospital for your procedure.  Do not drink clear liquids within 2 hours of your scheduled arrival to the hospital.  Clear liquids include  --Water or Apple juice without pulp  --Clear carbohydrate beverage such as Gatorade  --Black Coffee or Clear Tea (No milk, no creamers, do not add anything to the coffee or tea   No gum chewing or hard candies.      __x__ 2. No Alcohol for 24 hours before or after surgery.   __x__3. No Smoking or e-cigarettes for 24 prior to surgery.  Do not use any chewable tobacco products for at least 6 hour prior to surgery   ____  4. Bring all medications with you on the day of surgery if instructed.    __x__ 5. Notify your doctor if there is any change in your medical condition     (cold, fever, infections).   __x__6. On the morning of surgery brush your teeth with toothpaste and water.  You may rinse your mouth with mouth wash if you wish.  Do not swallow any toothpaste or mouthwash.   Do not wear jewelry, make-up, hairpins, clips or nail polish.  Do not wear lotions, powders, deodorant, or perfumes.   Do not shave 48 hours prior to surgery.   Do not bring valuables to the hospital.    [facility] is not responsible for any belongings or valuables.               Contacts, dentures  or bridgework may not be worn into surgery.  Leave your suitcase in the car. After surgery it may be brought to your room.  For patients admitted to the hospital, discharge time is determined by your treatment team.  Please read over the following fact sheets that you were given:   [facility] Preparing for [facility] and or MRSA Information   _x___ Take anti-hypertensive listed below, cardiac, seizure, asthma, anti-reflux and psychiatric medicines. These include:  1. Diltiazem/Cardizem  2. Metoprolol/Toprol (if you can tolerate without food)  3. Sertraline/Zoloft  _x___ Use CHG Soap or sage wipes as directed on instruction sheet   _x___ Follow recommendations from Cardiologist, Pulmonologist or PCP regarding stopping Aspirin, Coumadin, Plavix ,Eliquis, Effient, or Pradaxa, and Pletal.  _x___Stop Anti-inflammatories such as Advil, Aleve, Ibuprofen, Motrin, Naproxen, Naprosyn, Goodies powders or aspirin products. OK to take Tylenol and Celebrex.   _x___ Stop supplements until after surgery.  But may continue Vitamin D, Vitamin B, and multivitamin.

## 2018-03-20 NOTE — Progress Notes (Signed)
Pt is doing well no current problems.

## 2018-03-20 NOTE — Progress Notes (Signed)
    GYNECOLOGY PROGRESS NOTE  Subjective:    Patient ID: Yesenia Wood, female    DOB: 10/22/1986, 32 y.o.   MRN: [id number]  HPI  Patient is a 32 y.o. G0P0000 female who presents for pre-operative examination.  She is scheduled to undergo laparoscopic bilateral tubal ligation and peritoneal biopsies.  Indications for procedure are pelvic pain (to assess for presence of endometriosis), heavy menses, and desiring permanent sterilization.  Denies any major complaints today.   The following portions of the patient's history were reviewed and updated as appropriate: allergies, current medications, past family history, past medical history, past social history, past surgical history and problem list.  Review of Systems Pertinent items noted in HPI and remainder of comprehensive ROS otherwise negative.   Objective:   Blood pressure 108/73, pulse 68, height 5\' 3"  (1.6 m), weight 148 lb 9.6 oz (67.4 kg), last menstrual period 02/08/2018. General appearance: alert and no distress Refer to H&P for remainder of exam.    Assessment:   Pelvic pain Heavy menses Desires permanent sterilization  Plan:  1. Patient desires surgical management with laparoscopic bilateral tubal ligation and peritoneal biopsies.  The risks of surgery were discussed in detail with the patient including but not limited to: bleeding which may require transfusion or reoperation; infection which may require prolonged hospitalization or re-hospitalization and antibiotic therapy; injury to bowel, bladder, ureters and major vessels or other surrounding organs; need for additional procedures including laparotomy; thromboembolic phenomenon, incisional problems and other postoperative or anesthesia complications.  Patient was told that the likelihood that her condition and symptoms will be treated effectively with this surgical management was very high; the postoperative expectations were also discussed in detail. The patient also  understands the alternative treatment options which were discussed in full. All questions were answered.  Rroutine preoperative instructions of having nothing to eat or drink after midnight on the day prior to surgery and also coming to the hospital 1.5 hours prior to her time of surgery were also emphasized.  She was told she may be called for a preoperative appointment this week prior to surgery and will be given further preoperative instructions at that visit. Printed patient education handouts about the procedure were given to the patient to review at home. 2. Heavy menses and to some degree patient's pelvic pain has improved. Continue new contraceptive Seasonale.  Can use Tylenol or NSAIDs for residual pain.   3. Desire for permanent sterilization.  Several discussions have been had with patient regarding her desires for no fertility.  She has been on other methods of birth control including Mirena IUD (although this was removed after 2 years due to patient's belief that this was causing her pelvic pain). Other reversible forms of contraception were again discussed with patient; she declines all other modalities. Risks of procedure discussed with patient including but not limited to: risk of regret, permanence of method, bleeding, infection, injury to surrounding organs and need for additional procedures.  Failure risk of 1-2 % with increased risk of ectopic gestation if pregnancy occurs was also discussed with patient.  Patient verbalized understanding of these risks and wants to proceed with sterilization.   At this time, during laparoscopy can also be used to assess pelvis for any other identifiable causes of pelvic pain (such as endometriosis).    Yesenia Wood, Yesenia Roblero, MD [facility]

## 2018-03-21 ENCOUNTER — Encounter: Payer: Self-pay | Admitting: Obstetrics and Gynecology

## 2018-03-21 NOTE — H&P (Signed)
@[place] @   GYNECOLOGY PREOPERATIVE HISTORY AND PHYSICAL   Subjective:  Yesenia Wood is a 32 y.o. G0P0000 deaf female here for surgical management of desiring permanent sterilization  Patient also with heavy menses and pelvic pain, and will undergo evaluation for endometriosis.   . No significant preoperative concerns.  Proposed surgery: Laparoscopic bilateral tubal ligation (with Filshie clips) and peritoneal biopsies   Pertinent Gynecological History: Menses: flow is moderate to heavy, lasting 3-5 days Contraception: OCP (estrogen/progesterone) Last pap: normal Date: 03/2017   Past Medical History:  Diagnosis Date  . B12 deficiency   . Bell's palsy    right sided  . Chronic migraine   . Deaf   . Depression   . 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 Fatigue   . Fatty liver   . Fibromyalgia   . GERD (gastroesophageal reflux disease)   . Headache   . High triglycerides   . Hyperglycemia   . Hypertension   . Hyperthyroidism   . Moderate asthma   . Obesity   . Paresthesia   . Premature birth    Past Surgical History:  Procedure Laterality Date  . BARTHOLIN GLAND CYST REMOVAL    . COCHLEAR IMPLANT    . COCHLEAR IMPLANT REMOVAL    . LAPAROSCOPY    . MYRINGOTOMY WITH TUBE PLACEMENT     OB History  Gravida Para Term Preterm AB Living  0 0 0 0 0 0  SAB TAB Ectopic Multiple Live Births  0 0 0 0 0  Patient denies any other pertinent gynecologic issues.  Family History  Adopted: Yes  Family history unknown: Yes   Social History   Socioeconomic History  . Marital status: Single    Spouse name: Not on file  . Number of children: 0  . Years of education: Not on file  . Highest education level: Some college, no degree  Occupational History  . Occupation: disability for hearing loss  Social Needs  . Financial resource strain: Somewhat hard  . Food insecurity:    Worry: Never true    Inability: Never true   . Transportation needs:    Medical: No    Non-medical: No  Tobacco Use  . Smoking status: Former Smoker    Packs/day: 1.00    Years: 2.00    Pack years: 2.00    Types: Cigarettes    Last attempt to quit: 11/28/2006    Years since quitting: 11.3  . Smokeless tobacco: Never Used  Substance and Sexual Activity  . Alcohol use: Yes    Alcohol/week: 0.0 oz    Comment: rarely  . Drug use: No  . Sexual activity: Not Currently    Birth control/protection: Pill    Comment: Seasonale  Lifestyle  . Physical activity:    Days per week: 7 days    Minutes per session: 150+ min  . Stress: To some extent  Relationships  . Social connections:    Talks on phone: Once a week    Gets together: Never    Attends religious service: Never    Active member of club or organization: No    Attends meetings of clubs or organizations: Never    Relationship status: Never married  . Intimate partner violence:    Fear of current or ex partner: No    Emotionally abused: No    Physically abused: No    Forced sexual activity: No  Other Topics Concern  .  Not on file  Social History Narrative   Lives with her adopted  parents   Hearing loss since birth    Current Outpatient Medications on File Prior to Visit  Medication Sig Dispense Refill  . acetaminophen (TYLENOL) 500 MG tablet Take 1,000 mg by mouth every 8 (eight) hours as needed for headache.    . dicyclomine (BENTYL) 20 MG tablet Take 1 tablet (20 mg total) by mouth every 6 (six) hours. (Patient taking differently: Take 20 mg by mouth 2 (two) times daily as needed for spasms. ) 270 tablet 1  . diltiazem (CARDIZEM CD) 120 MG 24 hr capsule Take 1 capsule (120 mg total) by mouth daily. 90 capsule 3  . levonorgestrel-ethinyl estradiol (SEASONALE,INTROVALE,JOLESSA) 0.15-0.03 MG tablet Take 1 tablet by mouth daily. 1 Package 4  . metoprolol succinate (TOPROL-XL) 25 MG 24 hr tablet Take 1 tablet (25 mg total) by mouth daily. Take with or immediately  following a meal. 90 tablet 3  . sertraline (ZOLOFT) 100 MG tablet Take 1 tablet (100 mg total) by mouth daily. 90 tablet 0   No current facility-administered medications on file prior to visit.    Allergies  Allergen Reactions  . Budesonide-Formoterol Fumarate Anaphylaxis and Other (See Comments)    Other reaction(s): Other (See Comments) chest pain Chest pain chest pain  . Astelin [Azelastine]     Tingling, numbness, nausea  . Gabapentin Nausea And Vomiting  . Nortriptyline Other (See Comments)    Other reaction(s): Other (See Comments)  . Diclofenac Other (See Comments)    Other reaction(s): Other (See Comments)  . Duloxetine Hcl Other (See Comments)    Other reaction(s): Other (See Comments)  . Montelukast Sodium Other (See Comments)  . Mupirocin Other (See Comments)  . Naproxen Other (See Comments) and Rash  . Naproxen Sodium Rash    mild rash  . Sulfa Antibiotics Hives and Rash      Review of Systems Constitutional: No recent fever/chills/sweats Respiratory: No recent cough/bronchitis Cardiovascular: No chest pain Gastrointestinal: No recent nausea/vomiting/diarrhea Genitourinary: No UTI symptoms Hematologic/lymphatic:No history of coagulopathy or recent blood thinner use    Objective:   Blood pressure 108/73, pulse 68, height 5\' 3"  (1.6 m), weight 148 lb 9.6 oz (67.4 kg), last menstrual period 02/08/2018. CONSTITUTIONAL: Well-developed, well-nourished female in no acute distress.  HENT:  Normocephalic, atraumatic, External right and left ear normal. Oropharynx is clear and moist EYES: Conjunctivae and EOM are normal. Pupils are equal, round, and reactive to light. No scleral icterus.  NECK: Normal range of motion, supple, no masses SKIN: Skin is warm and dry. No rash noted. Not diaphoretic. No erythema. No pallor. NEUROLOGIC: Alert and oriented to person, place, and time. Normal reflexes, muscle tone coordination. No cranial nerve deficit noted. PSYCHIATRIC:  Normal mood and affect. Normal behavior. Normal judgment and thought content. CARDIOVASCULAR: Normal heart rate noted, regular rhythm RESPIRATORY: Effort and breath sounds normal, no problems with respiration noted ABDOMEN: Soft, nontender, nondistended. PELVIC: Deferred MUSCULOSKELETAL: Normal range of motion. No edema and no tenderness. 2+ distal pulses.    Labs: Results for orders placed or performed during the hospital encounter of 03/20/18 (from the past 336 hour(s))  CBC   Collection Time: 03/20/18 12:13 PM  Result Value Ref Range   WBC 5.6 3.6 - 11.0 K/uL   RBC 4.56 3.80 - 5.20 MIL/uL   Hemoglobin 13.7 12.0 - 16.0 g/dL   HCT 74.040.1 81.435.0 - 48.147.0 %   MCV 87.8 80.0 - 100.0 fL  MCH 30.0 26.0 - 34.0 pg   MCHC 34.2 32.0 - 36.0 g/dL   RDW 78.2 95.6 - 21.3 %   Platelets 368 150 - 440 K/uL  Basic metabolic panel   Collection Time: 03/20/18 12:13 PM  Result Value Ref Range   Sodium 135 135 - 145 mmol/L   Potassium 4.3 3.5 - 5.1 mmol/L   Chloride 106 101 - 111 mmol/L   CO2 24 22 - 32 mmol/L   Glucose, Bld 94 65 - 99 mg/dL   BUN 10 6 - 20 mg/dL   Creatinine, Ser 0.86 0.44 - 1.00 mg/dL   Calcium 8.3 (L) 8.9 - 10.3 mg/dL   GFR calc non Af Amer >60 >60 mL/min   GFR calc Af Amer >60 >60 mL/min   Anion gap 5 5 - 15     Imaging Studies: ULTRASOUND REPORT  Location: [facility] Date of Service:  11/20/17   Indications: F/U Left Ovarian Cyst Findings:  The uterus measures 7.6 x 4.4 x 4.0 cm. Echo texture is homogeneous without evidence of focal masses. The Endometrium measures 1.7 mm.  Right Ovary measures 2.2 x 1.8 x 1.6 cm and appears WNL.  Left Ovary measures 2.8 x 1.8 x 1.9 cm and appears WNL.  Survey of the adnexa demonstrates no adnexal masses. There is no free fluid in the cul de sac.  Impression: 1. Retroverted uterus appears of normal size and contour. 2. The endometrium measures 1.7 mm. 3. Bilateral ovaries appear  WNL.  Recommendations: 1.Clinical correlation with the patient's History and Physical Exam.   Assessment:    Desires permanent sterilization Pelvic pain Heavy menses   Plan:    Counseling: Procedure, risks, reasons, benefits and complications (including injury to bowel, bladder, major blood vessel, ureter, bleeding, possibility of transfusion, infection, or fistula formation) reviewed in detail. Likelihood of success in alleviating the patient's condition was discussed. Routine postoperative instructions will be reviewed with the patient and her family in detail after surgery.  The patient concurred with the proposed plan, giving informed written consent for the surgery.   Patient desires permanent sterilization.  Other reversible forms of contraception were discussed with patient; she declines all other modalities. Risks of procedure discussed with patient including but not limited to: risk of regret, permanence of method, bleeding, infection, injury to surrounding organs and need for additional procedures.  Failure risk of about 1% with increased risk of ectopic gestation if pregnancy occurs was also discussed with patient.  Patient verbalized understanding of these risks and wants to proceed with sterilization.  Written informed consent obtained.  To OR when ready. Preop testing ordered. Instructions reviewed, including NPO after midnight.      Hildred Laser, MD [facility]

## 2018-03-26 ENCOUNTER — Ambulatory Visit: Payer: Medicare Other | Admitting: Certified Registered Nurse Anesthetist

## 2018-03-26 ENCOUNTER — Encounter: Payer: Self-pay | Admitting: *Deleted

## 2018-03-26 ENCOUNTER — Telehealth: Payer: Self-pay | Admitting: Obstetrics and Gynecology

## 2018-03-26 ENCOUNTER — Ambulatory Visit
Admission: RE | Admit: 2018-03-26 | Discharge: 2018-03-26 | Disposition: A | Discharge status: Home / Self Care | Payer: Medicare Other | Source: Ambulatory Visit | Attending: Obstetrics and Gynecology | Admitting: Obstetrics and Gynecology

## 2018-03-26 ENCOUNTER — Encounter
Admission: RE | Disposition: A | Discharge status: Home / Self Care | Payer: Self-pay | Source: Ambulatory Visit | Attending: Obstetrics and Gynecology

## 2018-03-26 DIAGNOSIS — J4540 Moderate persistent asthma, uncomplicated: Secondary | ICD-10-CM | POA: Diagnosis not present

## 2018-03-26 DIAGNOSIS — M797 Fibromyalgia: Secondary | ICD-10-CM | POA: Insufficient documentation

## 2018-03-26 DIAGNOSIS — Z79899 Other long term (current) drug therapy: Secondary | ICD-10-CM | POA: Diagnosis not present

## 2018-03-26 DIAGNOSIS — Z302 Encounter for sterilization: Secondary | ICD-10-CM | POA: Insufficient documentation

## 2018-03-26 DIAGNOSIS — N736 Female pelvic peritoneal adhesions (postinfective): Secondary | ICD-10-CM | POA: Insufficient documentation

## 2018-03-26 DIAGNOSIS — I10 Essential (primary) hypertension: Secondary | ICD-10-CM | POA: Insufficient documentation

## 2018-03-26 DIAGNOSIS — Z9851 Tubal ligation status: Secondary | ICD-10-CM

## 2018-03-26 DIAGNOSIS — E0590 Thyrotoxicosis, unspecified without thyrotoxic crisis or storm: Secondary | ICD-10-CM | POA: Insufficient documentation

## 2018-03-26 DIAGNOSIS — J45909 Unspecified asthma, uncomplicated: Secondary | ICD-10-CM | POA: Diagnosis not present

## 2018-03-26 DIAGNOSIS — K219 Gastro-esophageal reflux disease without esophagitis: Secondary | ICD-10-CM | POA: Insufficient documentation

## 2018-03-26 DIAGNOSIS — Z87891 Personal history of nicotine dependence: Secondary | ICD-10-CM | POA: Insufficient documentation

## 2018-03-26 DIAGNOSIS — R102 Pelvic and perineal pain: Secondary | ICD-10-CM | POA: Diagnosis not present

## 2018-03-26 DIAGNOSIS — G4733 Obstructive sleep apnea (adult) (pediatric): Secondary | ICD-10-CM | POA: Diagnosis not present

## 2018-03-26 DIAGNOSIS — E538 Deficiency of other specified B group vitamins: Secondary | ICD-10-CM | POA: Diagnosis not present

## 2018-03-26 DIAGNOSIS — F329 Major depressive disorder, single episode, unspecified: Secondary | ICD-10-CM | POA: Diagnosis not present

## 2018-03-26 DIAGNOSIS — K760 Fatty (change of) liver, not elsewhere classified: Secondary | ICD-10-CM | POA: Insufficient documentation

## 2018-03-26 DIAGNOSIS — N926 Irregular menstruation, unspecified: Secondary | ICD-10-CM

## 2018-03-26 HISTORY — PX: TUBAL LIGATION: SHX77

## 2018-03-26 HISTORY — PX: LAPAROSCOPIC TUBAL LIGATION: SHX1937

## 2018-03-26 LAB — POCT PREGNANCY, URINE: Preg Test, Ur: NEGATIVE

## 2018-03-26 SURGERY — LIGATION, FALLOPIAN TUBE, LAPAROSCOPIC
Anesthesia: General | Laterality: Bilateral | Wound class: Clean Contaminated

## 2018-03-26 MED ORDER — SUGAMMADEX SODIUM 200 MG/2ML IV SOLN
INTRAVENOUS | Status: DC | PRN
  Administered 2018-03-26: 150 mg via INTRAVENOUS

## 2018-03-26 MED ORDER — FENTANYL CITRATE (PF) 100 MCG/2ML IJ SOLN
INTRAMUSCULAR | Status: AC
  Administered 2018-03-26: 25 ug via INTRAVENOUS
  Filled 2018-03-26: qty 2

## 2018-03-26 MED ORDER — FAMOTIDINE 20 MG PO TABS
20.0000 mg | ORAL_TABLET | Freq: Once | ORAL | Status: AC
  Administered 2018-03-26: 20 mg via ORAL

## 2018-03-26 MED ORDER — OXYCODONE HCL 5 MG PO TABS
5.0000 mg | ORAL_TABLET | Freq: Four times a day (QID) | ORAL | Status: DC | PRN
  Administered 2018-03-26: 5 mg via ORAL
  Filled 2018-03-26: qty 1

## 2018-03-26 MED ORDER — FENTANYL CITRATE (PF) 100 MCG/2ML IJ SOLN
25.0000 ug | INTRAMUSCULAR | Status: DC | PRN
  Administered 2018-03-26 (×4): 25 ug via INTRAVENOUS

## 2018-03-26 MED ORDER — FAMOTIDINE 20 MG PO TABS
ORAL_TABLET | ORAL | Status: AC
  Administered 2018-03-26: 20 mg via ORAL
  Filled 2018-03-26: qty 1

## 2018-03-26 MED ORDER — ONDANSETRON HCL 4 MG/2ML IJ SOLN
INTRAMUSCULAR | Status: AC
  Filled 2018-03-26: qty 2

## 2018-03-26 MED ORDER — LACTATED RINGERS IV SOLN: INTRAVENOUS | Status: DC

## 2018-03-26 MED ORDER — SIMETHICONE 80 MG PO CHEW: 80.0000 mg | CHEWABLE_TABLET | Freq: Four times a day (QID) | ORAL | 1 refills | Status: DC | PRN

## 2018-03-26 MED ORDER — ROCURONIUM BROMIDE 100 MG/10ML IV SOLN
INTRAVENOUS | Status: DC | PRN
  Administered 2018-03-26: 5 mg via INTRAVENOUS
  Administered 2018-03-26: 45 mg via INTRAVENOUS

## 2018-03-26 MED ORDER — LIDOCAINE HCL (PF) 2 % IJ SOLN
INTRAMUSCULAR | Status: AC
  Filled 2018-03-26: qty 20

## 2018-03-26 MED ORDER — PROPOFOL 10 MG/ML IV BOLUS
INTRAVENOUS | Status: AC
  Filled 2018-03-26: qty 40

## 2018-03-26 MED ORDER — MIDAZOLAM HCL 2 MG/2ML IJ SOLN
INTRAMUSCULAR | Status: AC
Start: 2018-03-26 — End: ?
  Filled 2018-03-26: qty 2

## 2018-03-26 MED ORDER — MIDAZOLAM HCL 2 MG/2ML IJ SOLN
INTRAMUSCULAR | Status: DC | PRN
  Administered 2018-03-26: 2 mg via INTRAVENOUS

## 2018-03-26 MED ORDER — GLYCOPYRROLATE 0.2 MG/ML IJ SOLN
INTRAMUSCULAR | Status: AC
  Filled 2018-03-26: qty 1

## 2018-03-26 MED ORDER — PHENYLEPHRINE HCL 10 MG/ML IJ SOLN
INTRAMUSCULAR | Status: DC | PRN
  Administered 2018-03-26: 100 ug via INTRAVENOUS
  Administered 2018-03-26: 50 ug via INTRAVENOUS

## 2018-03-26 MED ORDER — LIDOCAINE HCL (CARDIAC) PF 100 MG/5ML IV SOSY
PREFILLED_SYRINGE | INTRAVENOUS | Status: DC | PRN
  Administered 2018-03-26: 80 mg via INTRAVENOUS

## 2018-03-26 MED ORDER — ROCURONIUM BROMIDE 50 MG/5ML IV SOLN
INTRAVENOUS | Status: AC
  Filled 2018-03-26: qty 1

## 2018-03-26 MED ORDER — ONDANSETRON HCL 4 MG/2ML IJ SOLN: 4.0000 mg | Freq: Once | INTRAMUSCULAR | Status: DC | PRN

## 2018-03-26 MED ORDER — OXYCODONE HCL 5 MG PO TABS: 5.0000 mg | ORAL_TABLET | Freq: Four times a day (QID) | ORAL | 0 refills | Status: DC | PRN

## 2018-03-26 MED ORDER — BUPIVACAINE HCL 0.5 % IJ SOLN
INTRAMUSCULAR | Status: DC | PRN
  Administered 2018-03-26: 10 mL

## 2018-03-26 MED ORDER — ACETAMINOPHEN 10 MG/ML IV SOLN
INTRAVENOUS | Status: DC | PRN
  Administered 2018-03-26: 1000 mg via INTRAVENOUS

## 2018-03-26 MED ORDER — BUPIVACAINE HCL (PF) 0.5 % IJ SOLN
INTRAMUSCULAR | Status: AC
  Filled 2018-03-26: qty 30

## 2018-03-26 MED ORDER — ONDANSETRON HCL 4 MG/2ML IJ SOLN
INTRAMUSCULAR | Status: DC | PRN
  Administered 2018-03-26: 4 mg via INTRAVENOUS

## 2018-03-26 MED ORDER — SUCCINYLCHOLINE CHLORIDE 20 MG/ML IJ SOLN
INTRAMUSCULAR | Status: AC
  Filled 2018-03-26: qty 1

## 2018-03-26 MED ORDER — OXYCODONE HCL 5 MG PO TABS
ORAL_TABLET | ORAL | Status: AC
  Filled 2018-03-26: qty 1

## 2018-03-26 MED ORDER — FENTANYL CITRATE (PF) 100 MCG/2ML IJ SOLN
INTRAMUSCULAR | Status: AC
  Filled 2018-03-26: qty 2

## 2018-03-26 MED ORDER — PROPOFOL 10 MG/ML IV BOLUS
INTRAVENOUS | Status: DC | PRN
  Administered 2018-03-26: 140 mg via INTRAVENOUS

## 2018-03-26 MED ORDER — DOCUSATE SODIUM 100 MG PO CAPS: 100.0000 mg | ORAL_CAPSULE | Freq: Two times a day (BID) | ORAL | 2 refills | Status: DC | PRN

## 2018-03-26 MED ORDER — LACTATED RINGERS IV SOLN
INTRAVENOUS | Status: DC
  Administered 2018-03-26: 07:00:00 via INTRAVENOUS

## 2018-03-26 MED ORDER — FENTANYL CITRATE (PF) 100 MCG/2ML IJ SOLN
INTRAMUSCULAR | Status: DC | PRN
  Administered 2018-03-26 (×2): 50 ug via INTRAVENOUS

## 2018-03-26 MED ORDER — DEXAMETHASONE SODIUM PHOSPHATE 10 MG/ML IJ SOLN
INTRAMUSCULAR | Status: DC | PRN
  Administered 2018-03-26: 10 mg via INTRAVENOUS

## 2018-03-26 MED ORDER — DEXAMETHASONE SODIUM PHOSPHATE 10 MG/ML IJ SOLN
INTRAMUSCULAR | Status: AC
  Filled 2018-03-26: qty 1

## 2018-03-26 MED ORDER — SUGAMMADEX SODIUM 200 MG/2ML IV SOLN
INTRAVENOUS | Status: AC
  Filled 2018-03-26: qty 2

## 2018-03-26 SURGICAL SUPPLY — 31 items
BLADE SURG SZ11 CARB STEEL (BLADE) ×2 IMPLANT
CATH ROBINSON RED A/P 16FR (CATHETERS) ×2 IMPLANT
CHLORAPREP W/TINT 26ML (MISCELLANEOUS) ×2 IMPLANT
CLIP FILSHIE TUBAL LIGA STRL (Clip) ×2 IMPLANT
CORD MONOPOLAR M/FML 12FT (MISCELLANEOUS) ×2 IMPLANT
DERMABOND ADVANCED (GAUZE/BANDAGES/DRESSINGS) ×1
DERMABOND ADVANCED .7 DNX12 (GAUZE/BANDAGES/DRESSINGS) ×1 IMPLANT
GLOVE BIO SURGEON STRL SZ 6.5 (GLOVE) ×2 IMPLANT
GLOVE BIOGEL PI IND STRL 7.0 (GLOVE) ×1 IMPLANT
GLOVE BIOGEL PI INDICATOR 7.0 (GLOVE) ×1
GLOVE INDICATOR 7.0 STRL GRN (GLOVE) ×2 IMPLANT
GOWN STRL REUS W/ TWL LRG LVL3 (GOWN DISPOSABLE) ×2 IMPLANT
GOWN STRL REUS W/TWL LRG LVL3 (GOWN DISPOSABLE) ×2
KIT PINK PAD W/HEAD ARE REST (MISCELLANEOUS) ×2
KIT PINK PAD W/HEAD ARM REST (MISCELLANEOUS) ×1 IMPLANT
KIT TURNOVER CYSTO (KITS) ×2 IMPLANT
LABEL OR SOLS (LABEL) ×2 IMPLANT
NS IRRIG 500ML POUR BTL (IV SOLUTION) ×2 IMPLANT
PACK GYN LAPAROSCOPIC (MISCELLANEOUS) ×2 IMPLANT
PAD OB MATERNITY 4.3X12.25 (PERSONAL CARE ITEMS) ×2 IMPLANT
PAD PREP 24X41 OB/GYN DISP (PERSONAL CARE ITEMS) ×2 IMPLANT
SHEARS ENDO 5MM LNG  ASK BEFOR (MISCELLANEOUS)
SHEARS ENDO 5MM LNG ASK BEFOR (MISCELLANEOUS) IMPLANT
SUT VIC AB 0 CT2 27 (SUTURE) ×2 IMPLANT
SUT VIC AB 4-0 SH 27 (SUTURE) ×1
SUT VIC AB 4-0 SH 27XANBCTRL (SUTURE) ×1 IMPLANT
TROCAR ENDO BLADELESS 11MM (ENDOMECHANICALS) ×2 IMPLANT
TROCAR XCEL NON-BLD 5MMX100MML (ENDOMECHANICALS) ×2 IMPLANT
TROCAR XCEL UNIV SLVE 11M 100M (ENDOMECHANICALS) ×2 IMPLANT
TUBING INSUFFLATION (TUBING) ×2 IMPLANT
filshie clip ×2 IMPLANT

## 2018-03-26 NOTE — Anesthesia Postprocedure Evaluation (Signed)
Anesthesia Post Note  Patient: Yesenia Wood  Procedure(s) Performed: LAPAROSCOPIC TUBAL LIGATION (Bilateral )  Patient location during evaluation: PACU Anesthesia Type: General Level of consciousness: awake and alert Pain management: pain level controlled Vital Signs Assessment: post-procedure vital signs reviewed and stable Respiratory status: spontaneous breathing, nonlabored ventilation, respiratory function stable and patient connected to nasal cannula oxygen Cardiovascular status: blood pressure returned to baseline and stable Postop Assessment: no apparent nausea or vomiting Anesthetic complications: no     Last Vitals:  Vitals:   03/26/18 0931 03/26/18 1016  BP: 110/79 114/77  Pulse: 89 88  Resp: 16 16  Temp: 36.9 C 37 C  SpO2: 98% 97%    Last Pain:  Vitals:   03/26/18 1016  TempSrc: Temporal  PainSc: 6                  Yevette Edwards

## 2018-03-26 NOTE — Transfer of Care (Signed)
Immediate Anesthesia Transfer of Care Note  Patient: Yesenia Wood  Procedure(s) Performed: LAPAROSCOPIC TUBAL LIGATION (Bilateral )  Patient Location: PACU  Anesthesia Type:General  Level of Consciousness: awake and alert   Airway & Oxygen Therapy: Patient Spontanous Breathing and Patient connected to face mask oxygen  Post-op Assessment: Report given to RN and Post -op Vital signs reviewed and stable  Post vital signs: Reviewed and stable  Last Vitals:  Vitals Value Taken Time  BP    Temp    Pulse 100 03/26/2018  8:48 AM  Resp 30 03/26/2018  8:48 AM  SpO2 96 % 03/26/2018  8:48 AM  Vitals shown include unvalidated device data.  Last Pain:  Vitals:   03/26/18 0630  TempSrc: Tympanic  PainSc: 2          Complications: No apparent anesthesia complications

## 2018-03-26 NOTE — Anesthesia Post-op Follow-up Note (Signed)
Anesthesia QCDR form completed.        

## 2018-03-26 NOTE — Anesthesia Procedure Notes (Signed)
Procedure Name: Intubation Performed by: Demetrius Charity, CRNA Pre-anesthesia Checklist: Patient identified, Patient being monitored, Timeout performed, Emergency Drugs available and Suction available Patient Re-evaluated:Patient Re-evaluated prior to induction Oxygen Delivery Method: Circle system utilized Preoxygenation: Pre-oxygenation with 100% oxygen Induction Type: IV induction Ventilation: Mask ventilation without difficulty Laryngoscope Size: Mac and 3 Grade View: Grade I Tube type: Oral Tube size: 7.0 mm Number of attempts: 1 Airway Equipment and Method: Stylet Placement Confirmation: ETT inserted through vocal cords under direct vision,  positive ETCO2 and breath sounds checked- equal and bilateral Secured at: 21 cm Tube secured with: Tape Dental Injury: Teeth and Oropharynx as per pre-operative assessment

## 2018-03-26 NOTE — H&P (Signed)
UPDATE TO PREVIOUS HISTORY AND PHYSICAL  The patient has been seen and examined.  H&P is up to date, no changes noted. Yesenia Wood is a 32 y.o. G0P0000 deaf female here for surgical management of desiring permanent sterilization  Patient also with heavy menses and pelvic pain, and will undergo evaluation for endometriosis.  All questions answered. No preoperative concerns. Patient can proceed to the OR for scheduled procedure.   Hildred Laser, MD 03/26/2018 7:24 AM

## 2018-03-26 NOTE — Anesthesia Preprocedure Evaluation (Signed)
Anesthesia Evaluation  Patient identified by MRN, date of birth, ID band Patient awake    Reviewed: Allergy & Precautions, H&P , NPO status , Patient's Chart, lab work & pertinent test results, reviewed documented beta blocker date and time   Airway Mallampati: II  TM Distance: >3 FB Neck ROM: full    Dental  (+) Teeth Intact   Pulmonary neg pulmonary ROS, shortness of breath and with exertion, asthma , sleep apnea , former smoker,    Pulmonary exam normal        Cardiovascular Exercise Tolerance: Good hypertension, On Medications negative cardio ROS Normal cardiovascular exam Rhythm:regular Rate:Normal     Neuro/Psych  Headaches, PSYCHIATRIC DISORDERS Depression  Neuromuscular disease negative neurological ROS  negative psych ROS   GI/Hepatic negative GI ROS, Neg liver ROS, GERD  Medicated,  Endo/Other  negative endocrine ROSHyperthyroidism   Renal/GU negative Renal ROS  negative genitourinary   Musculoskeletal   Abdominal   Peds  Hematology negative hematology ROS (+)   Anesthesia Other Findings Past Medical History: No date: B12 deficiency No date: Bell's palsy     Comment:  right sided No date: Chronic migraine No date: Deaf No date: Depression No date: Dissocial personality disorder (HCC)     Comment:  three different personalities documented by PCP No date: Fatigue No date: Fatty liver No date: Fibromyalgia No date: GERD (gastroesophageal reflux disease) No date: Headache No date: High triglycerides No date: Hyperglycemia No date: Hypertension No date: Hyperthyroidism No date: Moderate asthma No date: Obesity No date: Paresthesia No date: Premature birth Past Surgical History: No date: BARTHOLIN GLAND CYST REMOVAL No date: COCHLEAR IMPLANT No date: COCHLEAR IMPLANT REMOVAL No date: LAPAROSCOPY No date: MYRINGOTOMY WITH TUBE PLACEMENT   Reproductive/Obstetrics negative OB ROS                              Anesthesia Physical Anesthesia Plan  ASA: III  Anesthesia Plan: General ETT   Post-op Pain Management:    Induction:   PONV Risk Score and Plan: 4 or greater  Airway Management Planned:   Additional Equipment:   Intra-op Plan:   Post-operative Plan:   Informed Consent: I have reviewed the patients History and Physical, chart, labs and discussed the procedure including the risks, benefits and alternatives for the proposed anesthesia with the patient or authorized representative who has indicated his/her understanding and acceptance.   Dental Advisory Given  Plan Discussed with: CRNA  Anesthesia Plan Comments:         Anesthesia Quick Evaluation

## 2018-03-26 NOTE — Telephone Encounter (Signed)
Spoke with pt's mother and informed her that some tubal ligation after care information had been sent to her MyChart.

## 2018-03-26 NOTE — Discharge Instructions (Addendum)
Laparoscopic Tubal Ligation, Care After °Refer to this sheet in the next few weeks. These instructions provide you with information about caring for yourself after your procedure. Your health care provider may also give you more specific instructions. Your treatment has been planned according to current medical practices, but problems sometimes occur. Call your health care provider if you have any problems or questions after your procedure. °What can I expect after the procedure? °After the procedure, it is common to have: °· A sore throat. °· Discomfort in your shoulder. °· Mild discomfort or cramping in your abdomen. °· Gas pains. °· Pain or soreness in the area where the surgical cut (incision) was made. °· A bloated feeling. °· Tiredness. °· Nausea. °· Vomiting. ° °Follow these instructions at home: °Medicines °· Take over-the-counter and prescription medicines only as told by your health care provider. °· Do not take aspirin because it can cause bleeding. °· Do not drive or operate heavy machinery while taking prescription pain medicine. °Activity °· Rest for the rest of the day. °· Return to your normal activities as told by your health care provider. Ask your health care provider what activities are safe for you. °Incision care ° °· Follow instructions from your health care provider about how to take care of your incision. Make sure you: °? Wash your hands with soap and water before you change your bandage (dressing). If soap and water are not available, use hand sanitizer. °? Change your dressing as told by your health care provider. °? Leave stitches (sutures) in place. They may need to stay in place for 2 weeks or longer. °· Check your incision area every day for signs of infection. Check for: °? More redness, swelling, or pain. °? More fluid or blood. °? Warmth. °? Pus or a bad smell. °Other Instructions °· Do not take baths, swim, or use a hot tub until your health care provider approves. You may take  showers. °· Keep all follow-up visits as told by your health care provider. This is important. °· Have someone help you with your daily household tasks for the first few days. °Contact a health care provider if: °· You have more redness, swelling, or pain around your incision. °· Your incision feels warm to the touch. °· You have pus or a bad smell coming from your incision. °· The edges of your incision break open after the sutures have been removed. °· Your pain does not improve after 2-3 days. °· You have a rash. °· You repeatedly become dizzy or light-headed. °· Your pain medicine is not helping. °· You are constipated. °Get help right away if: °· You have a fever. °· You faint. °· You have increasing pain in your abdomen. °· You have severe pain in one or both of your shoulders. °· You have fluid or blood coming from your sutures or from your vagina. °· You have shortness of breath or difficulty breathing. °· You have chest pain or leg pain. °· You have ongoing nausea, vomiting, or diarrhea. °This information is not intended to replace advice given to you by your health care provider. Make sure you discuss any questions you have with your health care provider. °Document Released: 06/03/2005 Document Revised: 04/18/2016 Document Reviewed: 10/25/2015 °Elsevier Interactive Patient Education © 2018 [employer] ° ° °AMBULATORY SURGERY  °DISCHARGE INSTRUCTIONS ° ° °1) The drugs that you were given will stay in your system until tomorrow so for the next 24 hours you should not: ° °A) Drive   an automobile °B) Make any legal decisions °C) Drink any alcoholic beverage ° ° °2) You may resume regular meals tomorrow.  Today it is better to start with liquids and gradually work up to solid foods. ° °You may eat anything you prefer, but it is better to start with liquids, then soup and crackers, and gradually work up to solid foods. ° ° °3) Please notify your doctor immediately if you have any unusual bleeding, trouble  breathing, redness and pain at the surgery site, drainage, fever, or pain not relieved by medication. ° ° ° °4) Additional Instructions: ° ° ° ° ° ° ° °Please contact your physician with any problems or Same Day Surgery at [phone], Monday through Friday 6 am to 4 pm, or [place] [facility] number at [phone]. °

## 2018-03-26 NOTE — Op Note (Addendum)
Procedure(s): LAPAROSCOPIC TUBAL LIGATION, PERITONEAL BIOPSIES Procedure Note  Yesenia Wood female 32 y.o. 03/26/2018  Indications: The patient is a 32 y.o. G0P0000 female with chronic pelvic pain, menorrhagia, and desiring permanent sterilization  Pre-operative Diagnosis: chronic pelvic pain, menorrhagia, and desiring permanent sterilization  Post-operative Diagnosis: Same with pelvic adhesions  Surgeon: Hildred Laser, MD  Assistants: None  Anesthesia: General endotracheal anesthesia  Findings: The uterus was sounded to 9 cm.  Fallopian tubes and ovaries appeared normal.  No lesions present in lower pelvis, including posterior cul-de-sac, bladder, uterosacral ligaments and ovarian fossa.  Filmy adhesions of bowel to the left pelvic sidewall.   Procedure Details: The patient was seen in the Holding Room. The risks, benefits, complications, treatment options, and expected outcomes were discussed with the patient.  The patient concurred with the proposed plan, giving informed consent.  The site of surgery properly noted/marked. The patient was taken to the Operating Room, identified as Yesenia Wood and the procedure verified as Procedure(s) (LRB): LAPAROSCOPIC TUBAL LIGATION (Bilateral) and PERITONEAL BIOPSIES.   She was then placed under general anesthesia without difficulty. She was placed in the dorsal lithotomy position, and was prepped and draped in a sterile manner. A Time Out was held and the above information confirmed.  A straight catheterization was performed. A sterile speculum was inserted into the vagina and the cervix was grasped at the anterior lip using a single-toothed tenaculum.  The uterus was sounded to 9 cm, and a Hulka clamp was placed for uterine manipulation.  The speculum and tenaculum were then removed.  Attention was turned to the abdomen where an umbilical incision was made with the scalpel.  The Optiview 11-mm trocar and sleeve were then advanced without  difficulty with the laparoscope under direct visualization into the abdomen.  The abdomen was then insufflated with carbon dioxide gas and adequate pneumoperitoneum was obtained. An 11-mm right lower quadrant port was then placed under direct visualization.  A survey of the patient's pelvis and abdomen revealed the findings as above. The fallopian tubes were observed and found to be normal in appearance. The Filshie clip applicator was placed through the operative port, and a Filshie clip was placed on the right fallopian tube ,about 2 cm from the cornual attachment, with care given to incorporate the underlying mesosalpinx.  A similar process was carried out on the contralateral side allowing for bilateral tubal sterilization.   Good hemostasis was noted overall.  Next, biopsy forceps were used to take random biopsies of the bladder flap and the posterior cul-de-sac.  Good hemostasis was noted at the biopsy sites.  Cold endoshears were then used to perform adhesiolysis of the filmy adhesions.  There was bleeding encountered along the pelvic sidewall, this was cauterized  Using the Kleppinger device.  A small pelvic sidewall hematoma was noted to form but did not expand. The instruments were then removed from the patient's abdomen and the fascial incisions were repaired with 0 Vicryl, and the skin was closed with 4-0 Vicryl and covered with Dermabond.  The uterine manipulator and the tenaculum were removed from the vagina without complications. The patient tolerated the procedure well.  Sponge, lap, and needle counts were correct times two.  The patient was then taken to the recovery room awake, extubated and in stable condition.  The patient will be discharged to home as per PACU criteria.  Routine postoperative instructions given.  She was prescribed Percocet, Ibuprofen and Colace.  She will follow up in the clinic on  1 for postoperative evaluation .  Estimated Blood Loss: 20 ml      Drains: straight  catheterization prior to procedure with 30 ml of clear urine         Total IV Fluids:  600 ml  Specimens: None         Implants: None         Complications:  None; patient tolerated the procedure well.         Disposition: PACU - hemodynamically stable.         Condition: stable   Hildred Laser, MD [facility]

## 2018-03-26 NOTE — Telephone Encounter (Signed)
The patients mother called and stated that she would like to speak with a nurse in regards to the patient not receiving any wound care instructions after leaving the hospital after her procedure. Please advise.

## 2018-03-27 LAB — SURGICAL PATHOLOGY

## 2018-03-28 ENCOUNTER — Other Ambulatory Visit: Payer: Self-pay | Admitting: Family Medicine

## 2018-03-28 DIAGNOSIS — F330 Major depressive disorder, recurrent, mild: Secondary | ICD-10-CM

## 2018-03-30 ENCOUNTER — Encounter: Payer: Self-pay | Admitting: Obstetrics and Gynecology

## 2018-04-04 ENCOUNTER — Encounter: Payer: Self-pay | Admitting: Obstetrics and Gynecology

## 2018-04-04 ENCOUNTER — Ambulatory Visit (INDEPENDENT_AMBULATORY_CARE_PROVIDER_SITE_OTHER): Payer: Medicare Other | Admitting: Obstetrics and Gynecology

## 2018-04-04 VITALS — BP 94/65 | HR 73 | Ht 63.0 in | Wt 146.8 lb

## 2018-04-04 DIAGNOSIS — L231 Allergic contact dermatitis due to adhesives: Secondary | ICD-10-CM

## 2018-04-04 DIAGNOSIS — Z4889 Encounter for other specified surgical aftercare: Secondary | ICD-10-CM

## 2018-04-04 NOTE — Progress Notes (Signed)
Pt feels like the incision on the right is coming open. Pt had a reaction to the pain medication and has a rash on both side of her body from arm pit to waist. Incision feels tender and sore.

## 2018-04-04 NOTE — Progress Notes (Signed)
    OBSTETRICS/GYNECOLOGY POST-OPERATIVE CLINIC VISIT  Subjective:     Yesenia Wood is a 32 y.o. female who presents to the clinic 1 weeks status post laparoscopy with biopsies and tubal ligation for pelvic pain and requested sterilization. Eating a regular diet without difficulty. Bowel movements are normal. Pain is controlled with current analgesics. Medications being used: acetaminophen and narcotic analgesics including oxycodone (Oxycontin, Oxyir).   However patient developed a rash across her abdomen, and was concerned that it was caused by her pain medication so she has not taken in the past 1-2 days.  She is also concerned regarding her incision on the right side, which she thinks is coming open.   The following portions of the patient's history were reviewed and updated as appropriate: allergies, current medications, past family history, past medical history, past social history, past surgical history and problem list.   Review of Systems Pertinent items noted in HPI and remainder of comprehensive ROS otherwise negative.    Objective:    BP 94/65   Pulse 73   Ht  (1.6 m)   Wt 146 lb 12.8 oz (66.6 kg)   BMI 26.00 kg/m  General:  alert and no distress  Abdomen: soft, bowel sounds active, non-tender.  Linear rash noted on both sides of abdomen, symmetric (likely from adhesive drape)  Incisions:   healing well, no drainage, no erythema, no hernia, no seroma, no swelling, no dehiscence, incision well approximated. Dermabond missing from incisions.    Pathology:   A. BLADDER FLAP; BIOPSY:  - UNREMARKABLE MESOTHELIAL LINED FIBROUS TISSUE.   B. CUL-DE-SAC; BIOPSY:  - UNREMARKABLE MESOTHELIAL LINED FIBROADIPOSE TISSUE.    Assessment:   S/p laparoscopic BTL and biopsies Post-operative course complicated by rash   Plan:   1. Continue any current medications.  Advised that rash was likely not caused by her medication but was due to the operative drape. Can continue  pain meds as needed.  Advised on Aloe vera, hydrocortisone cream, and Benadryl for rash.  2. Wound care discussed.  3. Operative findings again reviewed. Pathology report discussed. Discussed that while no endometriosis was found, it did not necessarily exclude conditions like adenomyosis, which is harder to diagnose. Advised to continue on with her current regimen of continuous OCPs as this has helped with her dysmenorrhea and heavy menses.  4. Activity restrictions: no bending, stooping, or squatting, no lifting more than 10-15 pounds and pelvic rest for an additional week 5. Anticipated return to work: 1 week. 6. Follow up: 3-4 months for reassessment of menstrual symptoms. If symptoms remain well controlled/mostly controlled, can continue OCPs and f/u yearly.     Hildred Laser, MD [facility]

## 2018-04-12 DIAGNOSIS — G43719 Chronic migraine without aura, intractable, without status migrainosus: Secondary | ICD-10-CM | POA: Diagnosis not present

## 2018-04-12 DIAGNOSIS — G43111 Migraine with aura, intractable, with status migrainosus: Secondary | ICD-10-CM | POA: Diagnosis not present

## 2018-04-12 DIAGNOSIS — G43109 Migraine with aura, not intractable, without status migrainosus: Secondary | ICD-10-CM | POA: Diagnosis not present

## 2018-04-16 ENCOUNTER — Other Ambulatory Visit: Payer: Self-pay | Admitting: Family Medicine

## 2018-04-16 DIAGNOSIS — R109 Unspecified abdominal pain: Secondary | ICD-10-CM

## 2018-04-18 ENCOUNTER — Encounter: Payer: Self-pay | Admitting: Obstetrics and Gynecology

## 2018-04-28 ENCOUNTER — Other Ambulatory Visit: Payer: Self-pay | Admitting: Obstetrics and Gynecology

## 2018-04-28 DIAGNOSIS — R102 Pelvic and perineal pain: Secondary | ICD-10-CM

## 2018-04-30 ENCOUNTER — Ambulatory Visit: Payer: Medicare Other

## 2018-05-19 ENCOUNTER — Other Ambulatory Visit: Payer: Self-pay | Admitting: Family Medicine

## 2018-05-19 DIAGNOSIS — R109 Unspecified abdominal pain: Secondary | ICD-10-CM

## 2018-05-21 ENCOUNTER — Ambulatory Visit: Payer: Medicare Other | Attending: Obstetrics and Gynecology

## 2018-05-21 DIAGNOSIS — R293 Abnormal posture: Secondary | ICD-10-CM | POA: Diagnosis not present

## 2018-05-21 DIAGNOSIS — M629 Disorder of muscle, unspecified: Secondary | ICD-10-CM | POA: Diagnosis not present

## 2018-05-21 DIAGNOSIS — M791 Myalgia, unspecified site: Secondary | ICD-10-CM | POA: Diagnosis not present

## 2018-05-21 DIAGNOSIS — M62838 Other muscle spasm: Secondary | ICD-10-CM

## 2018-05-21 DIAGNOSIS — M6289 Other specified disorders of muscle: Secondary | ICD-10-CM

## 2018-05-21 NOTE — Therapy (Signed)
[place] [facility] SERVICES [address] [place], [place], [address] Phone: [phone]   Fax:  [phone]  Physical Therapy Re-Evaluation  Patient Details  Name: Yesenia Wood MRN: [id number] Date of Birth: 12-Dec-1985 Referring Provider: Rubie Maid   Encounter Date: 05/21/2018    Past Medical History:  Diagnosis Date  . B12 deficiency   . Bell's palsy    right sided  . Chronic migraine   . Deaf   . Depression   . 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 Fatigue   . Fatty liver   . Fibromyalgia   . GERD (gastroesophageal reflux disease)   . Headache   . High triglycerides   . Hyperglycemia   . Hypertension   . Hyperthyroidism   . Moderate asthma   . Obesity   . Paresthesia   . Premature birth     Past Surgical History:  Procedure Laterality Date  . BARTHOLIN GLAND CYST REMOVAL    . COCHLEAR IMPLANT    . COCHLEAR IMPLANT REMOVAL    . LAPAROSCOPIC TUBAL LIGATION Bilateral 03/26/2018   Procedure: LAPAROSCOPIC TUBAL LIGATION;  Surgeon: Rubie Maid, MD;  Location: [facility] ORS;  Service: Gynecology;  Laterality: Bilateral;  with peritoneal biopsies  . LAPAROSCOPY    . MYRINGOTOMY WITH TUBE PLACEMENT      There were no vitals filed for this visit.  Pelvic Floor Physical Therapy Re-Evaluation and Assessment  SCREENING  Falls in last 6 mo: has had 2 falls, landed on her hands and face. Has felt dizzy and off-balance still.  Patient's communication preference:   Red Flags:  Have you had any night sweats? Yes, since surgery. Unexplained weight loss? Yes, 147 to 138 in ~ 4 weeks without trying. Saddle anesthesia? Yes, intermittent tingling on B inner thighs. Unexplained changes in bowel or bladder habits? Can sometimes have pain or incomplete emptying, especially if the bladder is really full.   SUBJECTIVE  Patient reports: Has cramping in the R thigh. Has to pee in  spurts to keep it from hurting.  Using  A heating pad can help when pain is high.  Precautions:  Deaf.  Social/Family/Vocational History:   Unemployed, lives in apartment at parents house.  Pertinent history of: Bell's palsy, dissocial personality disorder, athsma, sleep apnea, sleep disturbance, depression,  fibromyalgia, labrinthine disorder.   Recent Procedures/Tests/Findings:  Had a tubal ligation on April 29th.  Had a normal Lumbar MRI in 06/2017  Gynecological History: History of painful periods since onset of menses,  Recent tubal ligation  Urinary History: Is peeing in spurts to keep from having pain, can fully relax but it hurts to pee when she tries.  Gastrointestinal History: Still having issues with constipation, has ~1 BM per week.  Sexual activity/pain: Not currently in a relationship but has had pain with deep thrusting in the past.    Location of pain: lower abdomen Current pain:  4/10  Max pain:  6-7/10 Least pain:  3/10 Nature of pain: sharp  Patient Goals: Be able to walk comfortably and do activities around the house without pain.    OBJECTIVE  Posture/Observations:  Sitting: slight posterior pelvic tilt and slouch. Standing: slight forward lean and posterior pelvic tilt    Abdominal:  Palpation: TTP in B LQ and mildly in LUQ. TTP at R lower rectus abdominus.  Diastasis: none Scar: Pt. Has healed incision in RLQ but her umbilical incision is still healing.  Gait Analysis: Pt.ambulates slowly with slight forward lean.   INTERVENTIONS THIS SESSION: Manual: performed TP release to L iliacus and QL to help decrease spasm and pain. Attempted to release R lower rectus abdominus but Pt. c/o nausea so it was discontinued. Therex: reviewed cobra and side-stretch and practiced with emphasis on not pushing through pain but just to the point of discomfort to allow for scar tissue to lay down in elongated state rather than shortened, affecting posture  and pain.      Total time: 60 min.                  Objective measurements completed on examination: See above findings.                PT Short Term Goals - 05/22/18 2149      PT SHORT TERM GOAL #1   Title  Patient will demonstrate improved pelvic allignment in standing and supine for improved pelvic position for PFM length/tension relationship.    Time  5    Period  Weeks    Status  Achieved    Target Date  01/15/18      PT SHORT TERM GOAL #2   Title  Patient will undergo Internal/External assessment of the PFM to allow or direct treatment of PFM spasms.    Time  5    Period  Weeks    Status  Not Met    Target Date  01/15/18      PT SHORT TERM GOAL #3   Title  Patient will demonstrate appropriate body mechanics with bending and lifting heavy objects to allow for decreased stress on the pelvic floor and low back.     Time  5    Period  Weeks    Status  Achieved    Target Date  01/15/18      PT SHORT TERM GOAL #4   Title  Patient will perform self external TP release to decrease spasm and sensitivity of nerves to allow for ecreased pain.    Time  5    Period  Weeks    Status  On-going    Target Date  06/26/18      PT SHORT TERM GOAL #5   Title  Patient will describe Max pain of no greater than 3/10 over prior week to demonstrate symptom improvement and improved quality of life.    Time  5    Period  Weeks    Status  On-going    Target Date  06/26/18        PT Long Term Goals - 05/22/18 2151      PT LONG TERM GOAL #1   Title  Patient will score at or below 8/43 on the Female NIH-CPSI and 5 on the VQ to demonstrate a clinically meaningful decrease in disability and distress due to pelvic floor dysfunction.    Baseline  NIH- CPSI: 18/43, VQ: 10/33     Time  10    Period  Weeks    Status  On-going    Target Date  07/31/18      PT LONG TERM GOAL #2   Title  Patient will demonstrate ability to use a tool for self TP release to allow for  maintenence of spasm release upon D/C.    Time  10    Period  Weeks      PT LONG TERM GOAL #3   Target Date  07/31/18      PT  LONG TERM GOAL #4   Title  Patient will demonstrate improved sitting and standing posture to demonstrate learning and decrease stress on the pelvic floor with functional activity.    Time  10    Period  Weeks    Target Date  07/31/18      PT LONG TERM GOAL #5   Time  10    Period  Weeks    Status  On-going    Target Date  07/31/18               Patient will benefit from skilled therapeutic intervention in order to improve the following deficits and impairments:     Visit Diagnosis: Myalgia  Other muscle spasm  Abnormal posture  Muscular imbalance     Problem List Patient Active Problem List   Diagnosis Date Noted  . OSA (obstructive sleep apnea) 10/10/2016  . Other insomnia 10/10/2016  . 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 Sleep disturbance 05/10/2016  . Chronic migraine without aura 03/29/2016  . Costochondral chest pain 12/28/2015  . Pain in the chest 06/09/2015  . Palpitations 06/09/2015  . Deaf 05/16/2015  . B12 deficiency 05/16/2015  . Cochlear implant status 05/16/2015  . Fatty infiltration of liver 05/16/2015  . Fibromyalgia syndrome 05/16/2015  . Gastro-esophageal reflux disease without esophagitis 05/16/2015  . Generalized headache 05/16/2015  . Right-sided Bell's palsy 05/16/2015  . H/O: depression 05/16/2015  . Hypertriglyceridemia 05/16/2015  . Blood glucose elevated 05/16/2015  . Irregular menstrual cycle 05/16/2015  . Overweight 05/16/2015  . Paresthesia 05/16/2015  . Disorder of labyrinth 05/16/2015  . Dyspnea 10/07/2014  . Moderate persistent intrinsic asthma without status asthmaticus without complication 47/99/8001   Willa Rough DPT, ATC Willa Rough 05/22/2018, 9:53 PM  [place] [facility] SERVICES [address] [place], [place],  [address] Phone: [phone]   Fax:  [phone]  Name: Yesenia Wood MRN: [id number] Date of Birth: 1986-01-04

## 2018-05-28 ENCOUNTER — Ambulatory Visit: Payer: Medicare Other

## 2018-06-04 ENCOUNTER — Ambulatory Visit: Payer: Medicare Other | Attending: Obstetrics and Gynecology

## 2018-06-04 DIAGNOSIS — M6289 Other specified disorders of muscle: Secondary | ICD-10-CM

## 2018-06-04 DIAGNOSIS — M791 Myalgia, unspecified site: Secondary | ICD-10-CM

## 2018-06-04 DIAGNOSIS — M629 Disorder of muscle, unspecified: Secondary | ICD-10-CM | POA: Insufficient documentation

## 2018-06-04 DIAGNOSIS — M62838 Other muscle spasm: Secondary | ICD-10-CM | POA: Diagnosis not present

## 2018-06-04 DIAGNOSIS — R293 Abnormal posture: Secondary | ICD-10-CM | POA: Diagnosis not present

## 2018-06-04 NOTE — Patient Instructions (Addendum)
   Find a lacrosse ball like the one above and place it under your low back when you are lying down on a firm surface. Bring enough pressure on the sore spot that it is uncomfortable but not unbearable and take deep breaths until the pain eases up by at least 50%.  Repeat this on the other side. You can do this for any spot that you can "trap" between a wall or the floor and the muscle too.

## 2018-06-04 NOTE — Therapy (Addendum)
[place] MAIN [facility] SERVICES [address] [place], [place], [address] Phone: [phone]   Fax:  [phone]  Physical Therapy Treatment  Patient Details  Name: Yesenia Wood MRN: [id number] Date of Birth: 11/10/1986 Referring Provider: Rubie Maid   Encounter Date: 06/04/2018  PT End of Session - 06/04/18 1209    Visit Number  10    Number of Visits  24    Date for PT Re-Evaluation  07/31/18    Authorization Type  Medicare    Authorization - Visit Number  1    Authorization - Number of Visits  10    PT Start Time  4076    PT Stop Time  1138    PT Time Calculation (min)  60 min    Activity Tolerance  Patient tolerated treatment well    Behavior During Therapy  [facility] for tasks assessed/performed       Past Medical History:  Diagnosis Date  . B12 deficiency   . Bell's palsy    right sided  . Chronic migraine   . Deaf   . Depression   . 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 Fatigue   . Fatty liver   . Fibromyalgia   . GERD (gastroesophageal reflux disease)   . Headache   . High triglycerides   . Hyperglycemia   . Hypertension   . Hyperthyroidism   . Moderate asthma   . Obesity   . Paresthesia   . Premature birth     Past Surgical History:  Procedure Laterality Date  . BARTHOLIN GLAND CYST REMOVAL    . COCHLEAR IMPLANT    . COCHLEAR IMPLANT REMOVAL    . LAPAROSCOPIC TUBAL LIGATION Bilateral 03/26/2018   Procedure: LAPAROSCOPIC TUBAL LIGATION;  Surgeon: Rubie Maid, MD;  Location: [facility] ORS;  Service: Gynecology;  Laterality: Bilateral;  with peritoneal biopsies  . LAPAROSCOPY    . MYRINGOTOMY WITH TUBE PLACEMENT      There were no vitals filed for this visit.    Pelvic Floor Physical Therapy Treatment Note  SCREENING  Changes in medications, allergies, or medical history?:  no   SUBJECTIVE  Patient reports: She is tired, her pain was increased when  playing with her niece, walking.  Precautions:  Deafness, fibromyalgia.  Pain update:  Location of pain: lower abdomen Current pain:  4/10  Max pain:  7/10 Least pain:  3/10 Nature of pain: throbbing, tightness  Pain in low back increased to 6/10 with forward bend   Patient Goals: Be able to walk comfortably and do activities around the house without pain.     OBJECTIVE  Changes in: Posture/Observations:    Range of Motion/Flexibilty:  Pt. Cannot reach her toes with forward flexion, stopped by low back pain and tightness. Improved following treatment but lower muscles need to be treated t next visit for more improvement.  Palpation: TTP to B lumbar paraspinals at L1-L5 at beginning of session. Her response to palpation in the low lumbar region was "ticklish" pre-treatment to upper paraspinals but was felt as pain following treatment. Palpation at L1 (treated) level was no longer painful following treatment.  Gait Analysis:  INTERVENTIONS THIS SESSION: Manual: performed STM and TP release to B ~L1 paraspinals to decrease pain and spasm that is putting pressure on nerve roots innervating the lower abdomen. Educated Pt. On how to perform elf TP release with a lacrosse ball to decrease spasms and pain  in the lower part of the lumbar paraspinals ~ L4. Dry needling: Educated Pt. On the risks and benefits of TPDN and performed standard-approach to the B ~L1 paraspinals to decrease pain and spasm that is putting pressure on nerve roots innervating the lower abdomen. Pt. Responded slowly to the first stick, with eventual resolution of pain and spasm and responded faster to the treatment on the L side.     Total time: 60 min.                   Trigger Point Dry Needling - 06/04/18 1219    Consent Given?  Yes    Education Handout Provided  No    Muscles Treated Upper Body  Longissimus    Longissimus Response  Twitch response elicited;Palpable increased muscle  length           PT Education - 06/04/18 1208    Education provided  Yes    Education Details  See Pt. Instructions and Interventions this session.    Person(s) Educated  Patient    Methods  Explanation;Demonstration;Tactile cues;Verbal cues;Handout via interpreter    Comprehension  Verbalized understanding;Returned demonstration;Verbal cues required;Tactile cues required via interpreter       PT Short Term Goals - 05/22/18 2149      PT SHORT TERM GOAL #1   Title  Patient will demonstrate improved pelvic allignment in standing and supine for improved pelvic position for PFM length/tension relationship.    Time  5    Period  Weeks    Status  Achieved    Target Date  01/15/18      PT SHORT TERM GOAL #2   Title  Patient will undergo Internal/External assessment of the PFM to allow or direct treatment of PFM spasms.    Time  5    Period  Weeks    Status  Not Met    Target Date  01/15/18      PT SHORT TERM GOAL #3   Title  Patient will demonstrate appropriate body mechanics with bending and lifting heavy objects to allow for decreased stress on the pelvic floor and low back.     Time  5    Period  Weeks    Status  Achieved    Target Date  01/15/18      PT SHORT TERM GOAL #4   Title  Patient will perform self external TP release to decrease spasm and sensitivity of nerves to allow for ecreased pain.    Time  5    Period  Weeks    Status  On-going    Target Date  06/26/18      PT SHORT TERM GOAL #5   Title  Patient will describe Max pain of no greater than 3/10 over prior week to demonstrate symptom improvement and improved quality of life.    Time  5    Period  Weeks    Status  On-going    Target Date  06/26/18        PT Long Term Goals - 05/22/18 2151      PT LONG TERM GOAL #1   Title  Patient will score at or below 8/43 on the Female NIH-CPSI and 5 on the VQ to demonstrate a clinically meaningful decrease in disability and distress due to pelvic floor  dysfunction.    Baseline  NIH- CPSI: 18/43, VQ: 10/33     Time  10    Period  Weeks  Status  On-going    Target Date  07/31/18      PT LONG TERM GOAL #2   Title  Patient will demonstrate ability to use a tool for self TP release to allow for maintenence of spasm release upon D/C.    Time  10    Period  Weeks      PT LONG TERM GOAL #3   Target Date  07/31/18      PT LONG TERM GOAL #4   Title  Patient will demonstrate improved sitting and standing posture to demonstrate learning and decrease stress on the pelvic floor with functional activity.    Time  10    Period  Weeks    Target Date  07/31/18      PT LONG TERM GOAL #5   Time  10    Period  Weeks    Status  On-going    Target Date  07/31/18            Plan - 06/04/18 1212    Clinical Impression Statement  Pt. Continues to respond slowly but surely to manual techniques and she reports doing her HEP daily. She demonstrated reduced pain in the ~ L1 paraspinals today but pain became more evident at the L4-L5 level with attempt at forward-fold to re-test motion following treatment. The low back is implemented greatly in her pain and is responding but Pt has slow response to all treatment and time for appropriate translation can slow treatments down as well. Continue per POC.     Clinical Presentation  Evolving    Clinical Decision Making  High    Rehab Potential  Fair    Clinical Impairments Affecting Rehab Potential  Deaf, multiple co-morbidities, fibromyalgia    PT Frequency  Other (comment)    PT Duration  -- 10 weekks    PT Treatment/Interventions  ADLs/Self Care Home Management;Aquatic Therapy;Biofeedback;Moist Heat;Traction;Ultrasound;Balance training;Gait training;Functional mobility training;Therapeutic activities;Therapeutic exercise;Neuromuscular re-education;Patient/family education;Orthotic Fit/Training;Scar mobilization;Manual techniques;Manual lymph drainage;Dry needling;Taping    PT Next Visit Plan  Ask about  home TP release success, surgery, add pelvic tilts and squats, educate on scar massage    PT Home Exercise Plan  diaphragmatic breathing, TA in quadruped, child's pose, adductor stretch, piriformis stretch, squatty potty, L side stretch, hip abduction, tall posture walking.  Bird-dog, multi-stepping, mini-marches, prone press-ups, TP release with lacrosse ball.    Consulted and Agree with Plan of Care  Patient       Patient will benefit from skilled therapeutic intervention in order to improve the following deficits and impairments:  Dizziness, Increased fascial restricitons, Impaired sensation, Improper body mechanics, Pain, Decreased coordination, Hypermobility, Postural dysfunction, Increased muscle spasms, Impaired tone, Decreased balance, Difficulty walking  Visit Diagnosis: Myalgia  Other muscle spasm  Abnormal posture  Muscular imbalance     Problem List Patient Active Problem List   Diagnosis Date Noted  . OSA (obstructive sleep apnea) 10/10/2016  . Other insomnia 10/10/2016  . 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 Sleep disturbance 05/10/2016  . Chronic migraine without aura 03/29/2016  . Costochondral chest pain 12/28/2015  . Pain in the chest 06/09/2015  . Palpitations 06/09/2015  . Deaf 05/16/2015  . B12 deficiency 05/16/2015  . Cochlear implant status 05/16/2015  . Fatty infiltration of liver 05/16/2015  . Fibromyalgia syndrome 05/16/2015  . Gastro-esophageal reflux disease without esophagitis 05/16/2015  . Generalized headache 05/16/2015  . Right-sided Bell's palsy 05/16/2015  . H/O: depression 05/16/2015  . Hypertriglyceridemia 05/16/2015  . Blood  glucose elevated 05/16/2015  . Irregular menstrual cycle 05/16/2015  . Overweight 05/16/2015  . Paresthesia 05/16/2015  . Disorder of labyrinth 05/16/2015  . Dyspnea 10/07/2014  . Moderate persistent intrinsic asthma without status asthmaticus without complication 44/46/1901   Willa Rough DPT,  ATC Willa Rough 06/04/2018, 12:19 PM  [place] MAIN [facility] SERVICES [address] [place], [place], [address] Phone: [phone]   Fax:  [phone]  Name: Yesenia Wood MRN: [id number] Date of Birth: [birth year]

## 2018-06-14 ENCOUNTER — Other Ambulatory Visit: Payer: Self-pay | Admitting: Family Medicine

## 2018-06-14 DIAGNOSIS — F330 Major depressive disorder, recurrent, mild: Secondary | ICD-10-CM

## 2018-06-14 NOTE — Telephone Encounter (Signed)
Refill request was sent to Dr. Krichna Sowles for approval and submission.  

## 2018-06-22 ENCOUNTER — Encounter: Payer: Self-pay | Admitting: Family Medicine

## 2018-06-22 ENCOUNTER — Ambulatory Visit (INDEPENDENT_AMBULATORY_CARE_PROVIDER_SITE_OTHER): Payer: Medicare Other | Admitting: Family Medicine

## 2018-06-22 VITALS — BP 108/64 | HR 82 | Temp 98.7°F | Resp 16 | Ht 63.0 in | Wt 143.8 lb

## 2018-06-22 DIAGNOSIS — M797 Fibromyalgia: Secondary | ICD-10-CM | POA: Diagnosis not present

## 2018-06-22 DIAGNOSIS — F330 Major depressive disorder, recurrent, mild: Secondary | ICD-10-CM

## 2018-06-22 DIAGNOSIS — J029 Acute pharyngitis, unspecified: Secondary | ICD-10-CM | POA: Diagnosis not present

## 2018-06-22 DIAGNOSIS — E538 Deficiency of other specified B group vitamins: Secondary | ICD-10-CM

## 2018-06-22 DIAGNOSIS — G4700 Insomnia, unspecified: Secondary | ICD-10-CM

## 2018-06-22 DIAGNOSIS — R632 Polyphagia: Secondary | ICD-10-CM

## 2018-06-22 DIAGNOSIS — H9193 Unspecified hearing loss, bilateral: Secondary | ICD-10-CM

## 2018-06-22 DIAGNOSIS — J4540 Moderate persistent asthma, uncomplicated: Secondary | ICD-10-CM | POA: Diagnosis not present

## 2018-06-22 DIAGNOSIS — E785 Hyperlipidemia, unspecified: Secondary | ICD-10-CM

## 2018-06-22 DIAGNOSIS — G43709 Chronic migraine without aura, not intractable, without status migrainosus: Secondary | ICD-10-CM

## 2018-06-22 DIAGNOSIS — K219 Gastro-esophageal reflux disease without esophagitis: Secondary | ICD-10-CM

## 2018-06-22 DIAGNOSIS — K30 Functional dyspepsia: Secondary | ICD-10-CM | POA: Diagnosis not present

## 2018-06-22 DIAGNOSIS — R252 Cramp and spasm: Secondary | ICD-10-CM

## 2018-06-22 DIAGNOSIS — Z79899 Other long term (current) drug therapy: Secondary | ICD-10-CM

## 2018-06-22 LAB — LIPID PANEL
Cholesterol: 277 mg/dL — ABNORMAL HIGH (ref <200)
HDL: 48 mg/dL — AB (ref >50)
LDL CHOLESTEROL (CALC): 195 mg/dL — AB
Non-HDL Cholesterol (Calc): 229 mg/dL (calc) — ABNORMAL HIGH (ref <130)
TRIGLYCERIDES: 169 mg/dL — AB (ref <150)
Total CHOL/HDL Ratio: 5.8 (calc) — ABNORMAL HIGH (ref <5.0)

## 2018-06-22 LAB — COMPLETE METABOLIC PANEL WITH GFR
AG Ratio: 1.8 (calc) (ref 1.0–2.5)
ALT: 15 U/L (ref 6–29)
AST: 14 U/L (ref 10–30)
Albumin: 4.9 g/dL (ref 3.6–5.1)
Alkaline phosphatase (APISO): 52 U/L (ref 33–115)
BILIRUBIN TOTAL: 0.4 mg/dL (ref 0.2–1.2)
BUN: 11 mg/dL (ref 7–25)
CHLORIDE: 101 mmol/L (ref 98–110)
CO2: 26 mmol/L (ref 20–32)
Calcium: 10.2 mg/dL (ref 8.6–10.2)
Creat: 0.81 mg/dL (ref 0.50–1.10)
GFR, EST AFRICAN AMERICAN: 111 mL/min/{1.73_m2} (ref >=60)
GFR, EST NON AFRICAN AMERICAN: 96 mL/min/{1.73_m2} (ref >=60)
GLOBULIN: 2.7 g/dL (ref 1.9–3.7)
Glucose, Bld: 85 mg/dL (ref 65–139)
Potassium: 4 mmol/L (ref 3.5–5.3)
Sodium: 136 mmol/L (ref 135–146)
Total Protein: 7.6 g/dL (ref 6.1–8.1)

## 2018-06-22 MED ORDER — CYANOCOBALAMIN 1000 MCG/ML IJ SOLN
1000.0000 ug | Freq: Once | INTRAMUSCULAR | Status: AC
  Administered 2018-06-22: 1000 ug via INTRAMUSCULAR

## 2018-06-22 NOTE — Progress Notes (Signed)
Name: Yesenia Wood   MRN: [id number]    DOB: 03/11/1986   Date:06/22/2018       Progress Note  Subjective  Chief Complaint  Chief Complaint  Patient presents with  . Medication Refill    3 month F/U-Trouble with sleeping and staying asleep (sleep pattern has been getting worst for the past 1-2 months)  . Depression  . Migraine    Unchanged-Still trying to learn what makes it worst  . Asthma    sees Dr. Kris Moutonamachandra-medication is not helping (breathing is worst but using both inhalers) SOB when she is not doing anything every so often  . FMS    Unchanged  . Polydipsia    Becoming Hungry very easily and staying thirsty. Abdominal pain and trying to vary her food to see if that helps.  . Insomnia    Trouble with sleeping and staying asleep (sleep pattern has been getting worst for the past 1-2 months) waking up because of pain and hungry    HPI  Asthma: sees Dr. Linward Natalamachandra, She is intolerant to a lot of medications.  She is not on medication for asthma, since it was not helping with symptoms.  She has SOB intermittently but no wheezing, occasionally wheezing  FMS: she has paresthesia, daily pain, she tried PT without  Improvement of  symptoms. She has been feeling more tired lately, she sates she tries to stay awake but falls asleep during the day. She had a sleep study in the past.   B12 deficiency: with paresthesia, she would like to start B12 injections, forgets to take oral medication on a regular basis.   Palpitation: on metoprolol since 2019 and seems to help with her SOB. She is compliant with medication   [facility]: [facility] follows with Dr. Haskell RilingFreedman and they are trying to get Botox approved, she still has frequent headaches.   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 No crying spells. She is however having  worsening of fatigue lately, also taking naps during the day and difficulty staying asleep at night. She is not sure if fatigue increased since started on metoprolol   Right calf pain: she has intermittent cramp on right calf, happens once or twice a week and the residual pain can last for a couple of days. Worse when walking and resolves by itself.   Polyphagia: she has noticed increase in hunger but no weight gain, also feeling more thirsty than usual. Denies polyuria.   Sore throat: intermittent , not sure if it is from drainage, not having fever , she feels very cold at times  Indigestion: she states certain types of food causes indigestion. Usually the healthy kind, fruit and vegetables.    Patient Active Problem List   Diagnosis Date Noted  . OSA (obstructive sleep apnea) 10/10/2016  . Other insomnia 10/10/2016  . Dissocial personality disorder ([facility]) 06/07/2016  . Sleep disturbance 05/10/2016  . Chronic migraine without aura 03/29/2016  . Costochondral chest pain 12/28/2015  . Pain in the chest 06/09/2015  . Palpitations 06/09/2015  . Deaf 05/16/2015  . B12 deficiency 05/16/2015  . Cochlear implant status 05/16/2015  . Fatty infiltration of liver 05/16/2015  . Fibromyalgia syndrome 05/16/2015  . Gastro-esophageal reflux disease without esophagitis 05/16/2015  . Generalized headache 05/16/2015  . Right-sided Bell's palsy 05/16/2015  . H/O:  depression 05/16/2015  . Hypertriglyceridemia 05/16/2015  . Blood glucose elevated 05/16/2015  . Irregular menstrual cycle 05/16/2015  . Overweight 05/16/2015  . Paresthesia 05/16/2015  . Disorder of labyrinth 05/16/2015  . Dyspnea 10/07/2014  . Moderate persistent intrinsic asthma without status asthmaticus without complication 10/07/2014    Past Surgical History:  Procedure Laterality Date  . BARTHOLIN GLAND CYST REMOVAL    . COCHLEAR IMPLANT    . COCHLEAR IMPLANT REMOVAL    . LAPAROSCOPIC TUBAL LIGATION Bilateral 03/26/2018    Procedure: LAPAROSCOPIC TUBAL LIGATION;  Surgeon: Hildred Laser, MD;  Location: [facility] ORS;  Service: Gynecology;  Laterality: Bilateral;  with peritoneal biopsies  . LAPAROSCOPY    . MYRINGOTOMY WITH TUBE PLACEMENT    . TUBAL LIGATION  03/26/2018    Family History  Adopted: Yes  Family history unknown: Yes    Social History   Socioeconomic History  . Marital status: Single    Spouse name: Not on file  . Number of children: 0  . Years of education: Not on file  . Highest education level: Some college, no degree  Occupational History  . Occupation: disability for hearing loss  Social Needs  . Financial resource strain: Somewhat hard  . Food insecurity:    Worry: Never true    Inability: Never true  . Transportation needs:    Medical: No    Non-medical: No  Tobacco Use  . Smoking status: Former Smoker    Packs/day: 1.00    Years: 2.00    Pack years: 2.00    Types: Cigarettes    Start date: 11/28/2004    Last attempt to quit: 11/28/2006    Years since quitting: 11.5  . Smokeless tobacco: Never Used  Substance and Sexual Activity  . Alcohol use: Not Currently    Alcohol/week: 0.0 oz    Frequency: Never  . Drug use: No  . Sexual activity: Not Currently    Birth control/protection: Pill    Comment: Seasonale  Lifestyle  . Physical activity:    Days per week: 7 days    Minutes per session: 150+ min  . Stress: To some extent  Relationships  . Social connections:    Talks on phone: Once a week    Gets together: Never    Attends religious service: Never    Active member of club or organization: No    Attends meetings of clubs or organizations: Never    Relationship status: Never married  . Intimate partner violence:    Fear of current or ex partner: No    Emotionally abused: No    Physically abused: No    Forced sexual activity: No  Other Topics Concern  . Not on file  Social History Narrative   Lives with her adopted  parents   Hearing loss since birth       Current Outpatient Medications:  .  acetaminophen (TYLENOL) 500 MG tablet, Take 1,000 mg by mouth every 8 (eight) hours as needed for headache., Disp: , Rfl:  .  dicyclomine (BENTYL) 20 MG tablet, Take 1 tablet (20 mg total) by mouth 2 (two) times daily as needed for spasms., Disp: 180 tablet, Rfl: 1 .  diltiazem (CARDIZEM CD) 120 MG 24 hr capsule, Take 1 capsule (120 mg total) by mouth daily., Disp: 90 capsule, Rfl: 3 .  docusate sodium (COLACE) 100 MG capsule, Take 100 mg by mouth 2 (two) times daily as needed for mild constipation., Disp: , Rfl:  .  levonorgestrel-ethinyl estradiol (  SEASONALE,INTROVALE,JOLESSA) 0.15-0.03 MG tablet, Take 1 tablet by mouth daily., Disp: 1 Package, Rfl: 4 .  metoprolol succinate (TOPROL-XL) 25 MG 24 hr tablet, Take 1 tablet (25 mg total) by mouth daily. Take with or immediately following a meal., Disp: 90 tablet, Rfl: 3 .  sertraline (ZOLOFT) 100 MG tablet, TAKE 1 TABLET BY MOUTH EVERY DAY, Disp: 90 tablet, Rfl: 0 .  simethicone (GAS-X) 80 MG chewable tablet, Chew 1 tablet (80 mg total) by mouth 4 (four) times daily as needed for flatulence., Disp: 60 tablet, Rfl: 1 .  loratadine (CLARITIN) 10 MG tablet, Take 10 mg by mouth daily., Disp: , Rfl:   Allergies  Allergen Reactions  . Budesonide-Formoterol Fumarate Anaphylaxis and Other (See Comments)    Other reaction(s): Other (See Comments) chest pain Chest pain chest pain  . Astelin [Azelastine]     Tingling, numbness, nausea  . Gabapentin Nausea And Vomiting  . Nortriptyline Other (See Comments)    Other reaction(s): Other (See Comments)  . Diclofenac Other (See Comments)    Other reaction(s): Other (See Comments)  . Duloxetine Hcl Other (See Comments)    Other reaction(s): Other (See Comments)  . Montelukast Sodium Other (See Comments)  . Mupirocin Other (See Comments)  . Naproxen Other (See Comments) and Rash  . Naproxen Sodium Rash    mild rash  . Oxycodone Hcl Rash  . Sulfa Antibiotics  Hives and Rash     ROS  Ten systems reviewed and is negative except as mentioned in HPI   Objective  Vitals:   06/22/18 0944  BP: 108/64  Pulse: 82  Resp: 16  Temp: 98.7 F (37.1 C)  TempSrc: Oral  SpO2: 97%  Weight: 143 lb 12.8 oz (65.2 kg)  Height: 5\' 3"  (1.6 m)    Body mass index is 25.47 kg/m.  Physical Exam  Constitutional: Patient appears well-developed and well-nourished. Overweight.  No distress.  HEENT: head atraumatic, normocephalic, pupils equal and reactive to light,neck supple, throat within normal limits Cardiovascular: Normal rate, regular rhythm and normal heart sounds.  No murmur heard. No BLE edema. Pulmonary/Chest: Effort normal and breath sounds normal. No respiratory distress. Abdominal: Soft.  There is no tenderness. Psychiatric: Patient has a normal mood and affect. behavior is normal. Judgment and thought content normal.  Recent Results (from the past 2160 hour(s))  Pregnancy, urine POC     Status: None   Collection Time: 03/26/18  6:40 AM  Result Value Ref Range   Preg Test, Ur NEGATIVE NEGATIVE    Comment:        THE SENSITIVITY OF THIS METHODOLOGY IS >24 mIU/mL   Surgical pathology     Status: None   Collection Time: 03/26/18  8:18 AM  Result Value Ref Range   SURGICAL PATHOLOGY      Surgical Pathology CASE: [id number] PATIENT: Bradly Chris Surgical Pathology Report     SPECIMEN SUBMITTED: A. Bladder flap B. Cul de sac  CLINICAL HISTORY: Chronic pelvic pain  PRE-OPERATIVE DIAGNOSIS: Desire sterilization  POST-OPERATIVE DIAGNOSIS: Same as pre op     DIAGNOSIS: A.  BLADDER FLAP; BIOPSY: - UNREMARKABLE MESOTHELIAL LINED FIBROUS TISSUE.  B.  CUL-DE-SAC; BIOPSY: - UNREMARKABLE MESOTHELIAL LINED FIBROADIPOSE TISSUE.   GROSS DESCRIPTION: A. Labeled: Bladder flap Received: In formalin Tissue fragment(s): 1 Size: 0.3 x 0.2 x 0.1 cm Description: Pink fragment Entirely submitted in one cassette.  B. Labeled:  Cul-de-sac Received: In formalin Tissue fragment(s): 3 Size: Aggregate, 0.7 x 0.15 x 0.1 cm Description: Pink-Tan fragments  Entirely submitted in one cassette.    Final Diagnosis performed by Elijah Birk, MD.   Electronically signed 03/27/2018 9:22:35AM The electronic signature indicates that the named Attending Patho logist has evaluated the specimen  Technical component performed at [facility], [address] [place], [place] [address] Lab: [phone] Dir: Jolene Schimke, MD, MMM  Professional component performed at [facility], [facility], [address] [place], [place], [place] [address] Lab: [phone] Dir: Georgiann Cocker. Rubinas, MD      PHQ2/9: Depression screen [facility] 2/9 06/22/2018 03/19/2018 01/29/2018 04/11/2017 06/07/2016  Decreased Interest 1 1 1  0 0  Down, Depressed, Hopeless 1 1 1 1  0  PHQ - 2 Score 2 2 2 1  0  Altered sleeping 3 3 3  - -  Tired, decreased energy 3 1 3  - -  Change in appetite 0 0 0 - -  Feeling bad or failure about yourself  0 0 0 - -  Trouble concentrating 2 1 0 - -  Moving slowly or fidgety/restless 2 1 1  - -  Suicidal thoughts 0 0 0 - -  PHQ-9 Score 12 8 9  - -  Difficult doing work/chores Somewhat difficult Not difficult at all Somewhat difficult - -     Fall Risk: Fall Risk  06/22/2018 03/19/2018 10/26/2017 04/11/2017 06/07/2016  Falls in the past year? No No No No No  Number falls in past yr: - - - - -  Injury with Fall? - - - - -  Comment - - - - -  Risk Factor Category  - - - - -     Functional Status Survey: Is the patient deaf or have difficulty hearing?: Yes(Deaf) Does the patient have difficulty seeing, even when wearing glasses/contacts?: Yes(Contacts and Glasses) Does the patient have difficulty concentrating, remembering, or making decisions?: Yes(Hard to focus and hard to remember) Does the patient have difficulty walking or climbing stairs?: Yes(FMS (pain) depends on the day) Does the patient have difficulty dressing or  bathing?: Yes(Sometimes) Does the patient have difficulty doing errands alone such as visiting a doctor's office or shopping?: Yes    Assessment & Plan  1. Depression, major, recurrent, mild (HCC)  - Ambulatory referral to Psychiatry  2. B12 deficiency  - cyanocobalamin ((VITAMIN B-12)) injection 1,000 mcg  3. Moderate persistent intrinsic asthma without status asthmaticus without complication  Following up with Dr. Linward Natal   4. Chronic migraine without aura without status migrainosus, not intractable  Keep follow up with Dr. Neale Burly   5. Fibromyalgia syndrome   Still has daily pain  6. Dyslipidemia  - Lipid panel  7. Gastro-esophageal reflux disease without esophagitis  Not on medication at this time, advised prn tums or ranitidine  8. Calf cramp  - COMPLETE METABOLIC PANEL WITH GFR  9. Bilateral deafness  Translator present today   10. Sore throat  Discussed differential diagnosis, gerd, AR, she will try otc medication   11. Polyphagia  - COMPLETE METABOLIC PANEL WITH GFR  12. Indigestion  She will start with otc tums or ranitidine prn   13. Insomnia, unspecified type  - Ambulatory referral to Psychiatry  14. Long-term use of high-risk medication  - COMPLETE METABOLIC PANEL WITH GFR

## 2018-06-22 NOTE — Patient Instructions (Signed)
[employer] to research for migraines

## 2018-06-24 ENCOUNTER — Other Ambulatory Visit: Payer: Self-pay | Admitting: Family Medicine

## 2018-06-24 ENCOUNTER — Encounter: Payer: Self-pay | Admitting: Cardiovascular Disease

## 2018-06-24 MED ORDER — ROSUVASTATIN CALCIUM 20 MG PO TABS: 20.0000 mg | ORAL_TABLET | Freq: Every day | ORAL | 0 refills | Status: DC

## 2018-06-25 ENCOUNTER — Ambulatory Visit: Payer: Medicare Other

## 2018-06-25 DIAGNOSIS — R293 Abnormal posture: Secondary | ICD-10-CM | POA: Diagnosis not present

## 2018-06-25 DIAGNOSIS — M791 Myalgia, unspecified site: Secondary | ICD-10-CM

## 2018-06-25 DIAGNOSIS — M62838 Other muscle spasm: Secondary | ICD-10-CM | POA: Diagnosis not present

## 2018-06-25 DIAGNOSIS — M629 Disorder of muscle, unspecified: Secondary | ICD-10-CM | POA: Diagnosis not present

## 2018-06-25 DIAGNOSIS — M6289 Other specified disorders of muscle: Secondary | ICD-10-CM

## 2018-06-25 NOTE — Therapy (Signed)
[place] MAIN [facility] SERVICES [address] [place], [place], [address] Phone: [phone]   Fax:  [phone]  Physical Therapy Treatment  Patient Details  Name: Yesenia Wood MRN: [id number] Date of Birth: 1986/08/27 Referring Provider: Rubie Maid   Encounter Date: 06/25/2018  PT End of Session - 06/25/18 1636    Visit Number  11    Number of Visits  24    Date for PT Re-Evaluation  07/31/18    Authorization Type  Medicare    Authorization - Visit Number  2    Authorization - Number of Visits  10    PT Start Time  1030    PT Stop Time  1130    PT Time Calculation (min)  60 min    Activity Tolerance  Patient tolerated treatment well    Behavior During Therapy  [facility] for tasks assessed/performed       Past Medical History:  Diagnosis Date  . B12 deficiency   . Bell's palsy    right sided  . Chronic migraine   . Deaf   . Depression   . 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 Fatigue   . Fatty liver   . Fibromyalgia   . GERD (gastroesophageal reflux disease)   . Headache   . High triglycerides   . Hyperglycemia   . Hypertension   . Hyperthyroidism   . Moderate asthma   . Obesity   . Paresthesia   . Premature birth     Past Surgical History:  Procedure Laterality Date  . BARTHOLIN GLAND CYST REMOVAL    . COCHLEAR IMPLANT    . COCHLEAR IMPLANT REMOVAL    . LAPAROSCOPIC TUBAL LIGATION Bilateral 03/26/2018   Procedure: LAPAROSCOPIC TUBAL LIGATION;  Surgeon: Rubie Maid, MD;  Location: [facility] ORS;  Service: Gynecology;  Laterality: Bilateral;  with peritoneal biopsies  . LAPAROSCOPY    . MYRINGOTOMY WITH TUBE PLACEMENT    . TUBAL LIGATION  03/26/2018    There were no vitals filed for this visit.    Pelvic Floor Physical Therapy Treatment Note  SCREENING  Changes in medications, allergies, or medical history?: Adding cholesterol medication today due to high LDL, a  statin.    SUBJECTIVE  Patient reports: Is tired today, a little stressed. Feels pain is a little better/she is able to push through to do more now.  Precautions:  Deaf  Pain update:  Location of pain: Lower abdomen Current pain:  4/10  Max pain:  7/10 Least pain:  4/10 Nature of pain: Sharp  Patient Goals: Be able to walk comfortably and do activities around the house without pain.    OBJECTIVE  Changes in:  Palpation: TTP to  B Obliques, Iliacus, and QL  INTERVENTIONS THIS SESSION: Manual: Performed TP release to B Obliques, Iliacus, and QL to decrease spasm and alllow for improved hip ext ROM for decreased pelvic and back pain.  Self-care: educated pt on the importance of lying as flat as possible to sleep to allow for maintained length of the hip-flexors. Instructed to use the fewest amount of pillows possible to put under her knees ~2 to take the pressure off of her low back rather than sleeping in the recliner and to use only enough pillow to support her head in a neutral position rather than bending it forward.  Total time: 60 min.  PT Education - 06/25/18 1635    Education provided  Yes    Education Details  See Interventions this session.    Person(s) Educated  Patient    Methods  Explanation;Demonstration;Tactile cues;Verbal cues Via interpreter    Comprehension  Verbalized understanding;Verbal cues required;Tactile cues required Via interpreter       PT Short Term Goals - 05/22/18 2149      PT SHORT TERM GOAL #1   Title  Patient will demonstrate improved pelvic allignment in standing and supine for improved pelvic position for PFM length/tension relationship.    Time  5    Period  Weeks    Status  Achieved    Target Date  01/15/18      PT SHORT TERM GOAL #2   Title  Patient will undergo Internal/External assessment of the PFM to allow or direct treatment of PFM spasms.    Time  5    Period  Weeks     Status  Not Met    Target Date  01/15/18      PT SHORT TERM GOAL #3   Title  Patient will demonstrate appropriate body mechanics with bending and lifting heavy objects to allow for decreased stress on the pelvic floor and low back.     Time  5    Period  Weeks    Status  Achieved    Target Date  01/15/18      PT SHORT TERM GOAL #4   Title  Patient will perform self external TP release to decrease spasm and sensitivity of nerves to allow for ecreased pain.    Time  5    Period  Weeks    Status  On-going    Target Date  06/26/18      PT SHORT TERM GOAL #5   Title  Patient will describe Max pain of no greater than 3/10 over prior week to demonstrate symptom improvement and improved quality of life.    Time  5    Period  Weeks    Status  On-going    Target Date  06/26/18        PT Long Term Goals - 05/22/18 2151      PT LONG TERM GOAL #1   Title  Patient will score at or below 8/43 on the Female NIH-CPSI and 5 on the VQ to demonstrate a clinically meaningful decrease in disability and distress due to pelvic floor dysfunction.    Baseline  NIH- CPSI: 18/43, VQ: 10/33     Time  10    Period  Weeks    Status  On-going    Target Date  07/31/18      PT LONG TERM GOAL #2   Title  Patient will demonstrate ability to use a tool for self TP release to allow for maintenence of spasm release upon D/C.    Time  10    Period  Weeks      PT LONG TERM GOAL #3   Target Date  07/31/18      PT LONG TERM GOAL #4   Title  Patient will demonstrate improved sitting and standing posture to demonstrate learning and decrease stress on the pelvic floor with functional activity.    Time  10    Period  Weeks    Target Date  07/31/18      PT LONG TERM GOAL #5   Time  10    Period  Weeks    Status  On-going    Target Date  07/31/18            Plan - 06/25/18 1636    Clinical Impression Statement  Pt. responded well to all treatment today, demonstrating understanding of all education  provided and decreased spasms following treatment. We were able to work through more muscle groups in one session today than has been previously possible, which is improvement. Pt. did not like Dry needling so we will not use it in the future.    Clinical Presentation  Evolving    Clinical Decision Making  High    Rehab Potential  Fair    Clinical Impairments Affecting Rehab Potential  Deaf, multiple co-morbidities, fibromyalgia    PT Frequency  Other (comment) every-other week    PT Duration  -- 10 weeks    PT Treatment/Interventions  ADLs/Self Care Home Management;Aquatic Therapy;Biofeedback;Moist Heat;Traction;Ultrasound;Balance training;Gait training;Functional mobility training;Therapeutic activities;Therapeutic exercise;Neuromuscular re-education;Patient/family education;Orthotic Fit/Training;Scar mobilization;Manual techniques;Manual lymph drainage;Dry needling;Taping    PT Next Visit Plan  Ask about home TP release success, add pelvic tilts and squats, educate on scar massage    PT Home Exercise Plan  diaphragmatic breathing, TA in quadruped, child's pose, adductor stretch, piriformis stretch, squatty potty, L side stretch, hip abduction, tall posture walking.  Bird-dog, multi-stepping, mini-marches, prone press-ups, TP release with lacrosse ball.    Consulted and Agree with Plan of Care  Patient       Patient will benefit from skilled therapeutic intervention in order to improve the following deficits and impairments:  Dizziness, Increased fascial restricitons, Impaired sensation, Improper body mechanics, Pain, Decreased coordination, Hypermobility, Postural dysfunction, Increased muscle spasms, Impaired tone, Decreased balance, Difficulty walking  Visit Diagnosis: Myalgia  Other muscle spasm  Abnormal posture  Muscular imbalance     Problem List Patient Active Problem List   Diagnosis Date Noted  . OSA (obstructive sleep apnea) 10/10/2016  . Other insomnia 10/10/2016  .  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 Sleep disturbance 05/10/2016  . Chronic migraine without aura 03/29/2016  . Costochondral chest pain 12/28/2015  . Pain in the chest 06/09/2015  . Palpitations 06/09/2015  . Deaf 05/16/2015  . B12 deficiency 05/16/2015  . Cochlear implant status 05/16/2015  . Fatty infiltration of liver 05/16/2015  . Fibromyalgia syndrome 05/16/2015  . Gastro-esophageal reflux disease without esophagitis 05/16/2015  . Generalized headache 05/16/2015  . Right-sided Bell's palsy 05/16/2015  . H/O: depression 05/16/2015  . Hypertriglyceridemia 05/16/2015  . Blood glucose elevated 05/16/2015  . Irregular menstrual cycle 05/16/2015  . Overweight 05/16/2015  . Paresthesia 05/16/2015  . Disorder of labyrinth 05/16/2015  . Dyspnea 10/07/2014  . Moderate persistent intrinsic asthma without status asthmaticus without complication 95/74/7340   Willa Rough DPT, ATC Willa Rough 06/25/2018, 4:42 PM  [place] MAIN [facility] SERVICES [address] [place], [place], [address] Phone: [phone]   Fax:  [phone]  Name: Yesenia Wood MRN: [id number] Date of Birth: 09/05/1986

## 2018-07-08 ENCOUNTER — Encounter: Payer: Self-pay | Admitting: Family Medicine

## 2018-07-09 ENCOUNTER — Ambulatory Visit: Payer: Medicare Other | Attending: Obstetrics and Gynecology

## 2018-07-09 DIAGNOSIS — M629 Disorder of muscle, unspecified: Secondary | ICD-10-CM | POA: Insufficient documentation

## 2018-07-09 DIAGNOSIS — M62838 Other muscle spasm: Secondary | ICD-10-CM | POA: Diagnosis not present

## 2018-07-09 DIAGNOSIS — M6289 Other specified disorders of muscle: Secondary | ICD-10-CM

## 2018-07-09 DIAGNOSIS — M791 Myalgia, unspecified site: Secondary | ICD-10-CM

## 2018-07-09 DIAGNOSIS — R293 Abnormal posture: Secondary | ICD-10-CM

## 2018-07-09 NOTE — Patient Instructions (Signed)
   Hold pressure on the two spots above to help decrease the spasm and take pressure off of your back and pelvis to prevent the hips getting out of alignment again.

## 2018-07-09 NOTE — Therapy (Signed)
[place] MAIN [facility] SERVICES [address] [place], [place], [address] Phone: [phone]   Fax:  [phone]  Physical Therapy Treatment  Patient Details  Name: Yesenia Wood MRN: [id number] Date of Birth: 1986-08-19 Referring Provider: Rubie Maid   Encounter Date: 07/09/2018  PT End of Session - 07/10/18 1139    Visit Number  13    Number of Visits  24    Date for PT Re-Evaluation  07/31/18    Authorization Type  Medicare    Authorization - Visit Number  4    Authorization - Number of Visits  10    PT Start Time  1030    PT Stop Time  1130    PT Time Calculation (min)  60 min    Activity Tolerance  Patient tolerated treatment well    Behavior During Therapy  [facility] for tasks assessed/performed       Past Medical History:  Diagnosis Date  . B12 deficiency   . Bell's palsy    right sided  . Chronic migraine   . Deaf   . Depression   . 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 Fatigue   . Fatty liver   . Fibromyalgia   . GERD (gastroesophageal reflux disease)   . Headache   . High triglycerides   . Hyperglycemia   . Hypertension   . Hyperthyroidism   . Moderate asthma   . Obesity   . Paresthesia   . Premature birth     Past Surgical History:  Procedure Laterality Date  . BARTHOLIN GLAND CYST REMOVAL    . COCHLEAR IMPLANT    . COCHLEAR IMPLANT REMOVAL    . LAPAROSCOPIC TUBAL LIGATION Bilateral 03/26/2018   Procedure: LAPAROSCOPIC TUBAL LIGATION;  Surgeon: Rubie Maid, MD;  Location: [facility] ORS;  Service: Gynecology;  Laterality: Bilateral;  with peritoneal biopsies  . LAPAROSCOPY    . MYRINGOTOMY WITH TUBE PLACEMENT    . TUBAL LIGATION  03/26/2018    There were no vitals filed for this visit.     Pelvic Floor Physical Therapy Treatment Note  SCREENING  Changes in medications, allergies, or medical history?: Started Crestor 20 mg. Is feeling like it makes her  feel confused and like she needs to drive slower.    SUBJECTIVE  Patient reports: She has been able to sleep better by using a pregnancy pillow. Is still sleeping in the recliner ~ 3 days over past week. Pain was worse for a couple days following last treatment and she still feels stiff. Has been able to use lacrosse ball to help decrease spikes in pain to bring it back down to average resting level.   Precautions:  Deaf.  Pain update:  Location of pain: mid back, hips and R neck. Current pain: 5/10  Max pain:  8/10 Least pain:  5/10 Nature of pain: achy  Patient Goals: Be able to walk comfortably and do activities around the house without pain.   OBJECTIVE  Changes in: Posture/Observations:  L up-slip pre treatment that was 75% improved following treatment with palpable tightness on L side continuing to crease muscular imbalance.  Range of Motion/Flexibilty:  Decreased mobility and increased pain through TL junction with mobilizations.   Palpation: TTP to multifidus at L TL junction.  INTERVENTIONS THIS SESSION: Manual: performed grade 2-4 PA and lateral mobilizations to T10-L1 to improve mobility and decrease spasms for decreased pressure on nerve roots that  innervate the diaphragm and stomach as well as the muscles of the abdomen that are leading to the L up-slip. Performed TP release to multifidus on the L TL junction, sacral mobilizations on the L sacral base, and performed hold-relax lengthening of the L Psoas followed by a quick-pullx2 to improve pelvic alignment and help decrease pressure on nerves that are causing pain. Educated Pt. On how to perform self TP release to L Psoas and Pectineus for decreased tension on L lumbar vertebrae and and improved balance of muscuature acting on the pelvis/decreased pain and spasm.  Therex: Reviewed bow-and-arrow rotations in side-lying and side-stretches to decrease tension of muscles pulling up on L hip and improve/maintain  mobility at TL junction to help prevent return of spasms for sustained pain releif.  Total time: 60 min.                      PT Education - 07/10/18 1138    Education provided  Yes    Education Details  See Pt. Instructions and Interventions this session.    Person(s) Educated  Patient    Methods  Explanation;Demonstration;Tactile cues;Verbal cues;Handout    Comprehension  Verbalized understanding;Returned demonstration;Verbal cues required;Tactile cues required       PT Short Term Goals - 05/22/18 2149      PT SHORT TERM GOAL #1   Title  Patient will demonstrate improved pelvic allignment in standing and supine for improved pelvic position for PFM length/tension relationship.    Time  5    Period  Weeks    Status  Achieved    Target Date  01/15/18      PT SHORT TERM GOAL #2   Title  Patient will undergo Internal/External assessment of the PFM to allow or direct treatment of PFM spasms.    Time  5    Period  Weeks    Status  Not Met    Target Date  01/15/18      PT SHORT TERM GOAL #3   Title  Patient will demonstrate appropriate body mechanics with bending and lifting heavy objects to allow for decreased stress on the pelvic floor and low back.     Time  5    Period  Weeks    Status  Achieved    Target Date  01/15/18      PT SHORT TERM GOAL #4   Title  Patient will perform self external TP release to decrease spasm and sensitivity of nerves to allow for ecreased pain.    Time  5    Period  Weeks    Status  On-going    Target Date  06/26/18      PT SHORT TERM GOAL #5   Title  Patient will describe Max pain of no greater than 3/10 over prior week to demonstrate symptom improvement and improved quality of life.    Time  5    Period  Weeks    Status  On-going    Target Date  06/26/18        PT Long Term Goals - 05/22/18 2151      PT LONG TERM GOAL #1   Title  Patient will score at or below 8/43 on the Female NIH-CPSI and 5 on the VQ to  demonstrate a clinically meaningful decrease in disability and distress due to pelvic floor dysfunction.    Baseline  NIH- CPSI: 18/43, VQ: 10/33     Time  10  Period  Weeks    Status  On-going    Target Date  07/31/18      PT LONG TERM GOAL #2   Title  Patient will demonstrate ability to use a tool for self TP release to allow for maintenence of spasm release upon D/C.    Time  10    Period  Weeks      PT LONG TERM GOAL #3   Target Date  07/31/18      PT LONG TERM GOAL #4   Title  Patient will demonstrate improved sitting and standing posture to demonstrate learning and decrease stress on the pelvic floor with functional activity.    Time  10    Period  Weeks    Target Date  07/31/18      PT LONG TERM GOAL #5   Time  10    Period  Weeks    Status  On-going    Target Date  07/31/18            Plan - 07/10/18 1140    Clinical Impression Statement  Pt. continues to demonstrate return of spasms that are treated at previous sessions and complains of pain near the T-L junction. There is restriction in this area that needs to be addressed but patient has low tolerance for manual treatment and is making slow progress. We will focus more on integrating stretches and exercises into every-day routine to see if Pt. can tolerate more frequent lower intensity mobilization better to see results. She did demonstrate improved mobility and decreased spasm following today's tretment but it is unknown if the results will be lasting with Pt's current HEP compliance somewhatin question. Continue per POC.    Clinical Presentation  Evolving    Clinical Decision Making  High    Clinical Impairments Affecting Rehab Potential  Deaf, multiple co-morbidities, fibromyalgia    PT Frequency  Other (comment)    PT Treatment/Interventions  ADLs/Self Care Home Management;Aquatic Therapy;Biofeedback;Moist Heat;Traction;Ultrasound;Balance training;Gait training;Functional mobility training;Therapeutic  activities;Therapeutic exercise;Neuromuscular re-education;Patient/family education;Orthotic Fit/Training;Scar mobilization;Manual techniques;Manual lymph drainage;Dry needling;Taping    PT Next Visit Plan  Try to integrate stretches/mobs throughout daily routine and get Pt. but-in with planning. Ask about home TP release success, add pelvic tilts and squats, educate on scar massage    PT Home Exercise Plan  diaphragmatic breathing, TA in quadruped, child's pose, adductor stretch, piriformis stretch, squatty potty, L side stretch, hip abduction, tall posture walking.  Bird-dog, multi-stepping, mini-marches, prone press-ups, TP release with lacrosse ball.    Consulted and Agree with Plan of Care  Patient       Patient will benefit from skilled therapeutic intervention in order to improve the following deficits and impairments:  Dizziness, Increased fascial restricitons, Impaired sensation, Improper body mechanics, Pain, Decreased coordination, Hypermobility, Postural dysfunction, Increased muscle spasms, Impaired tone, Decreased balance, Difficulty walking  Visit Diagnosis: Myalgia  Other muscle spasm  Abnormal posture  Muscular imbalance     Problem List Patient Active Problem List   Diagnosis Date Noted  . OSA (obstructive sleep apnea) 10/10/2016  . Other insomnia 10/10/2016  . 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 Sleep disturbance 05/10/2016  . Chronic migraine without aura 03/29/2016  . Costochondral chest pain 12/28/2015  . Pain in the chest 06/09/2015  . Palpitations 06/09/2015  . Deaf 05/16/2015  . B12 deficiency 05/16/2015  . Cochlear implant status 05/16/2015  . Fatty infiltration of liver 05/16/2015  . Fibromyalgia syndrome 05/16/2015  . Gastro-esophageal reflux disease without  esophagitis 05/16/2015  . Generalized headache 05/16/2015  . Right-sided Bell's palsy 05/16/2015  . H/O: depression 05/16/2015  . Hypertriglyceridemia 05/16/2015  . Blood  glucose elevated 05/16/2015  . Irregular menstrual cycle 05/16/2015  . Overweight 05/16/2015  . Paresthesia 05/16/2015  . Disorder of labyrinth 05/16/2015  . Dyspnea 10/07/2014  . Moderate persistent intrinsic asthma without status asthmaticus without complication 86/75/1982   Willa Rough DPT, ATC Willa Rough 07/10/2018, 11:46 AM  [place] MAIN [facility] SERVICES [place], [place], [address] Phone: [phone]   Fax:  [phone]  Name: Yesenia Wood MRN: [id number] Date of Birth: 1986-10-27

## 2018-07-10 ENCOUNTER — Other Ambulatory Visit: Payer: Self-pay | Admitting: Family Medicine

## 2018-07-23 ENCOUNTER — Ambulatory Visit: Payer: Medicare Other

## 2018-07-23 DIAGNOSIS — R293 Abnormal posture: Secondary | ICD-10-CM

## 2018-07-23 DIAGNOSIS — M629 Disorder of muscle, unspecified: Secondary | ICD-10-CM | POA: Diagnosis not present

## 2018-07-23 DIAGNOSIS — M62838 Other muscle spasm: Secondary | ICD-10-CM

## 2018-07-23 DIAGNOSIS — M791 Myalgia, unspecified site: Secondary | ICD-10-CM

## 2018-07-23 DIAGNOSIS — M6289 Other specified disorders of muscle: Secondary | ICD-10-CM

## 2018-07-23 NOTE — Therapy (Signed)
[place] MAIN [facility] SERVICES [address] [place], [place], [address] Phone: [phone]   Fax:  [phone]  Physical Therapy Treatment  Patient Details  Name: Yesenia Wood MRN: [id number] Date of Birth: 1986/01/22 Referring Provider: Rubie Maid   Encounter Date: 07/23/2018  PT End of Session - 07/23/18 1316    Visit Number  14    Number of Visits  24    Date for PT Re-Evaluation  07/31/18    Authorization Type  Medicare    Authorization - Visit Number  5    Authorization - Number of Visits  10    PT Start Time  0930    PT Stop Time  1030    PT Time Calculation (min)  60 min    Activity Tolerance  Patient tolerated treatment well    Behavior During Therapy  [facility] for tasks assessed/performed       Past Medical History:  Diagnosis Date  . B12 deficiency   . Bell's palsy    right sided  . Chronic migraine   . Deaf   . Depression   . 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 Fatigue   . Fatty liver   . Fibromyalgia   . GERD (gastroesophageal reflux disease)   . Headache   . High triglycerides   . Hyperglycemia   . Hypertension   . Hyperthyroidism   . Moderate asthma   . Obesity   . Paresthesia   . Premature birth     Past Surgical History:  Procedure Laterality Date  . BARTHOLIN GLAND CYST REMOVAL    . COCHLEAR IMPLANT    . COCHLEAR IMPLANT REMOVAL    . LAPAROSCOPIC TUBAL LIGATION Bilateral 03/26/2018   Procedure: LAPAROSCOPIC TUBAL LIGATION;  Surgeon: Rubie Maid, MD;  Location: [facility] ORS;  Service: Gynecology;  Laterality: Bilateral;  with peritoneal biopsies  . LAPAROSCOPY    . MYRINGOTOMY WITH TUBE PLACEMENT    . TUBAL LIGATION  03/26/2018    There were no vitals filed for this visit.    Pelvic Floor Physical Therapy Treatment Note  SCREENING  Changes in medications, allergies, or medical history?: added benadryl    SUBJECTIVE  Patient  reports: Has been itchy and it has distracted her from her pain by her itching but feels that her pain is aggravated by her exercises. Especially the mini-marches and side-stretches seem to aggravate it and she feels the most pain when going from standing to sitting. It takes about three hours for her pain to go back to normal.   Precautions:  Deafness  Pain update:  Location of pain: lower abdomen and low back Current pain:  3/10  Max pain:  3-4/10 Least pain:  3-4/10 Nature of pain: achy  Patient Goals: Be able to walk comfortably and do activities around the house without pain.   OBJECTIVE  Changes in:  Range of Motion/Flexibilty:  Decreased mobility throughout neck, especially at L C1 transverse process and C4-7 spinous processes.    Palpation: TTP through L>R SCM and R>L upper trapezius.   INTERVENTIONS THIS SESSION: Manual: Performed a combination of grade 1-3 lateral, antero-lateral and PA mobs to C1, C2, and C6, TP release to R Upper trap and L SCM and STM throuhout neck and upper shoulders to decrease spasm and stiffness to allow for decreased neural sensitivity at the neck and down-stream due to Pt. Slow response to distal treatment and Hx. Of  Migraines, dizziness, and TMJ signalling that there is significantly altered musculoskeletal mechanics in this region which is likely affecting our success with distal musculature. Self-care: discussed the importance of continuing to do her HEP and not "over-doing" it with exercises that are more challenging, just pushing to the point of discomfort, not through it.  Total time: 60 min.                          PT Education - 07/23/18 1315    Education provided  Yes    Education Details  See Interventions this session.    Person(s) Educated  Patient    Methods  Explanation    Comprehension  Verbalized understanding       PT Short Term Goals - 05/22/18 2149      PT SHORT TERM GOAL #1   Title   Patient will demonstrate improved pelvic allignment in standing and supine for improved pelvic position for PFM length/tension relationship.    Time  5    Period  Weeks    Status  Achieved    Target Date  01/15/18      PT SHORT TERM GOAL #2   Title  Patient will undergo Internal/External assessment of the PFM to allow or direct treatment of PFM spasms.    Time  5    Period  Weeks    Status  Not Met    Target Date  01/15/18      PT SHORT TERM GOAL #3   Title  Patient will demonstrate appropriate body mechanics with bending and lifting heavy objects to allow for decreased stress on the pelvic floor and low back.     Time  5    Period  Weeks    Status  Achieved    Target Date  01/15/18      PT SHORT TERM GOAL #4   Title  Patient will perform self external TP release to decrease spasm and sensitivity of nerves to allow for ecreased pain.    Time  5    Period  Weeks    Status  On-going    Target Date  06/26/18      PT SHORT TERM GOAL #5   Title  Patient will describe Max pain of no greater than 3/10 over prior week to demonstrate symptom improvement and improved quality of life.    Time  5    Period  Weeks    Status  On-going    Target Date  06/26/18        PT Long Term Goals - 05/22/18 2151      PT LONG TERM GOAL #1   Title  Patient will score at or below 8/43 on the Female NIH-CPSI and 5 on the VQ to demonstrate a clinically meaningful decrease in disability and distress due to pelvic floor dysfunction.    Baseline  NIH- CPSI: 18/43, VQ: 10/33     Time  10    Period  Weeks    Status  On-going    Target Date  07/31/18      PT LONG TERM GOAL #2   Title  Patient will demonstrate ability to use a tool for self TP release to allow for maintenence of spasm release upon D/C.    Time  10    Period  Weeks      PT LONG TERM GOAL #3   Target Date  07/31/18      PT  LONG TERM GOAL #4   Title  Patient will demonstrate improved sitting and standing posture to demonstrate  learning and decrease stress on the pelvic floor with functional activity.    Time  10    Period  Weeks    Target Date  07/31/18      PT LONG TERM GOAL #5   Time  10    Period  Weeks    Status  On-going    Target Date  07/31/18            Plan - 07/23/18 1316    Clinical Impression Statement  Pt. is finally demonstrating improvement and improved compliance with her HEP. The slow rate of change in her musculature and her history of having Migraines, TMJ, and dizziness are leading me to believe that she has restriction in her neurologic structures in her neck that are leading to re-tightening of muscles and slower rate of improvement than is expected. This was supported by physical exam today revealing significant tenderness to musculature surrounding the head and neck and decreased mobility through the cervical spine. Pt. responded well to all interventions today, demonstrating decreased spasm and only mildly increased dizziness upon sitting which resolved quickly. We will continue to work on allignment of the spine to address pain and spasms.    Clinical Presentation  Evolving    Clinical Decision Making  High    Rehab Potential  Fair    Clinical Impairments Affecting Rehab Potential  Deaf, multiple co-morbidities, fibromyalgia    PT Frequency  Other (comment)    PT Duration  --   10  weeks   PT Treatment/Interventions  ADLs/Self Care Home Management;Aquatic Therapy;Biofeedback;Moist Heat;Traction;Ultrasound;Balance training;Gait training;Functional mobility training;Therapeutic activities;Therapeutic exercise;Neuromuscular re-education;Patient/family education;Orthotic Fit/Training;Scar mobilization;Manual techniques;Manual lymph drainage;Dry needling;Taping    PT Next Visit Plan  Ask about home TP release success, add pelvic tilts and squats, educate on scar massage, re-assess spine restrictions in cervical and at T-L junction    PT Home Exercise Plan  diaphragmatic breathing, TA in  quadruped, child's pose, adductor stretch, piriformis stretch, squatty potty, L side stretch, hip abduction, tall posture walking.  Bird-dog, multi-stepping, mini-marches, prone press-ups, TP release with lacrosse ball.    Consulted and Agree with Plan of Care  Patient       Patient will benefit from skilled therapeutic intervention in order to improve the following deficits and impairments:  Dizziness, Increased fascial restricitons, Impaired sensation, Improper body mechanics, Pain, Decreased coordination, Hypermobility, Postural dysfunction, Increased muscle spasms, Impaired tone, Decreased balance, Difficulty walking  Visit Diagnosis: Myalgia  Other muscle spasm  Abnormal posture  Muscular imbalance     Problem List Patient Active Problem List   Diagnosis Date Noted  . OSA (obstructive sleep apnea) 10/10/2016  . Other insomnia 10/10/2016  . 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 Sleep disturbance 05/10/2016  . Chronic migraine without aura 03/29/2016  . Costochondral chest pain 12/28/2015  . Pain in the chest 06/09/2015  . Palpitations 06/09/2015  . Deaf 05/16/2015  . B12 deficiency 05/16/2015  . Cochlear implant status 05/16/2015  . Fatty infiltration of liver 05/16/2015  . Fibromyalgia syndrome 05/16/2015  . Gastro-esophageal reflux disease without esophagitis 05/16/2015  . Generalized headache 05/16/2015  . Right-sided Bell's palsy 05/16/2015  . H/O: depression 05/16/2015  . Hypertriglyceridemia 05/16/2015  . Blood glucose elevated 05/16/2015  . Irregular menstrual cycle 05/16/2015  . Overweight 05/16/2015  . Paresthesia 05/16/2015  . Disorder of labyrinth 05/16/2015  . Dyspnea 10/07/2014  . Moderate  persistent intrinsic asthma without status asthmaticus without complication 03/79/5583   Willa Rough DPT, ATC Willa Rough 07/23/2018, 1:24 PM  [place] MAIN [facility] SERVICES [address] [place],  [place], [address] Phone: [phone]   Fax:  [phone]  Name: Yesenia Wood MRN: [id number] Date of Birth: 1986/01/18

## 2018-07-26 DIAGNOSIS — G43719 Chronic migraine without aura, intractable, without status migrainosus: Secondary | ICD-10-CM | POA: Diagnosis not present

## 2018-07-26 DIAGNOSIS — G43109 Migraine with aura, not intractable, without status migrainosus: Secondary | ICD-10-CM | POA: Diagnosis not present

## 2018-07-26 DIAGNOSIS — G43111 Migraine with aura, intractable, with status migrainosus: Secondary | ICD-10-CM | POA: Diagnosis not present

## 2018-07-26 DIAGNOSIS — M791 Myalgia, unspecified site: Secondary | ICD-10-CM | POA: Diagnosis not present

## 2018-07-26 DIAGNOSIS — M542 Cervicalgia: Secondary | ICD-10-CM | POA: Diagnosis not present

## 2018-07-26 DIAGNOSIS — G518 Other disorders of facial nerve: Secondary | ICD-10-CM | POA: Diagnosis not present

## 2018-08-03 ENCOUNTER — Encounter: Payer: Self-pay | Admitting: Family Medicine

## 2018-08-13 ENCOUNTER — Ambulatory Visit: Payer: Medicare Other | Attending: Obstetrics and Gynecology

## 2018-08-13 DIAGNOSIS — M791 Myalgia, unspecified site: Secondary | ICD-10-CM | POA: Insufficient documentation

## 2018-08-13 DIAGNOSIS — R293 Abnormal posture: Secondary | ICD-10-CM | POA: Insufficient documentation

## 2018-08-13 DIAGNOSIS — M6289 Other specified disorders of muscle: Secondary | ICD-10-CM

## 2018-08-13 DIAGNOSIS — M62838 Other muscle spasm: Secondary | ICD-10-CM | POA: Insufficient documentation

## 2018-08-13 DIAGNOSIS — M629 Disorder of muscle, unspecified: Secondary | ICD-10-CM | POA: Insufficient documentation

## 2018-08-13 NOTE — Therapy (Signed)
[place] [facility] MAIN [facility] [address] [place], [place], [address] Phone: [phone]   Fax:  [phone]  Patient Details  Name: Yesenia ShoeLaurie J Minjares MRN: [id number] Date of Birth: 01/13/1986 Referring Provider:  Hildred Laserherry, Anika, MD  Encounter Date: 08/13/2018   Pt. Arrived for her Appt. On time but there was no interpreter present. A request was submitted for interpreter on August 28th which, per interpreter's agency, was deleted per the Pt's request but she does not have the ability to do this to our knowledge and obviously did not intend to cancel this or she would not have arrived for her appt. We will have to re-assess goals at her next appt. Due to the time-frame but Pt. Is expected to continue to require PT.    Cleophus MoltKeeli T. Adnan Vanvoorhis DPT, ATC Cleophus MoltKeeli T Raesha Coonrod 08/13/2018, 9:57 AM  [place] [facility] MAIN [facility] [address] [place], [place], [address] Phone: [phone]   Fax:  [phone]

## 2018-08-27 ENCOUNTER — Ambulatory Visit: Payer: Medicare Other

## 2018-09-04 ENCOUNTER — Encounter: Payer: Self-pay | Admitting: Obstetrics and Gynecology

## 2018-09-04 ENCOUNTER — Ambulatory Visit (INDEPENDENT_AMBULATORY_CARE_PROVIDER_SITE_OTHER): Payer: Medicare Other | Admitting: Obstetrics and Gynecology

## 2018-09-04 ENCOUNTER — Other Ambulatory Visit: Payer: Self-pay | Admitting: Family Medicine

## 2018-09-04 VITALS — BP 113/74 | HR 77 | Ht 63.0 in | Wt 147.4 lb

## 2018-09-04 DIAGNOSIS — N921 Excessive and frequent menstruation with irregular cycle: Secondary | ICD-10-CM

## 2018-09-04 DIAGNOSIS — N946 Dysmenorrhea, unspecified: Secondary | ICD-10-CM | POA: Diagnosis not present

## 2018-09-04 DIAGNOSIS — R102 Pelvic and perineal pain: Secondary | ICD-10-CM

## 2018-09-04 MED ORDER — ELAGOLIX SODIUM 150 MG PO TABS: 1.0000 | ORAL_TABLET | Freq: Every day | ORAL | 0 refills | Status: DC

## 2018-09-04 NOTE — Progress Notes (Signed)
Pt stated that she is doing well. Pt stated that the birth control is working a little, having some spotting and cramping twice a month.

## 2018-09-04 NOTE — Progress Notes (Signed)
    GYNECOLOGY PROGRESS NOTE  Subjective:    Patient ID: Yesenia Wood, female    DOB: [birth year], 32 y.o.   MRN: [id number]  HPI  Patient is a 32 y.o. G0P0000 deaf female who presents for 5 months follow up menorrhagia and dysmenorrhea.  Had an operative laparoscopy to assess for endometriosis and BTL, and has been on OCPs (continuous regimen) since then. No endometriosis was noted at time of surgery.   Today notes that since the surgery, she has continued to have spotting (usually lasting for 1-2 weeks each month).   Has also noted that the cramping has increased in intensity but decreased in frequency.  Notes that she has tried heating pads, Tylenol, and decreasing activity during menses. Heating pads helps some but other regimens. Sign language interpreter present for interpretation of today's visit.  She was  The following portions of the patient's history were reviewed and updated as appropriate: allergies, current medications, past family history, past medical history, past social history, past surgical history and problem list.  Review of Systems Pertinent items noted in HPI and remainder of comprehensive ROS otherwise negative.   Objective:   Blood pressure 113/74, pulse 77, height 5\' 3"  (1.6 m), weight 147 lb 6.4 oz (66.9 kg), last menstrual period 08/30/2018. General appearance: alert and no distress Abdomen: soft, non-tender; bowel sounds normal; no masses,  no organomegaly Pelvic: deferred   Assessment:   Pelvic pain Dysmenorrhea Prolonged menses  Plan:   - Patient currently on continuous OCPs ([place]).  Has been on this regimen for approximately 4 months.  Overall her symptoms have improved (decrease in flow and decrease in frequency of pain) however she does have new symptoms developing (periods are becoming more prolonged, and the intensity of the pain is increasing when it occurs).  Patient also with symptoms still resembling endometriosis, however has had a  negative recent operative laparoscopy.  She perhaps may have adenomyosis.  Discussed options for management again, including changing to a different hormonal contraceptive, discontinuing contraception and using a different progesterone such as Aygestin, trying Orilissa or Lupron for hormonal suppression, or surgical options such as endometrial ablation or hysterectomy.  Patient at this time notes that she would like to avoid surgical options if possible.  She notes that she would like to try Lupron, however after discussion that Lupron may be difficult to attain secondary to her not having a biopsy confirmed diagnosis of endometriosis.  However I do have samples of Orilissa in the office without requiring purchase.  Given patient a month worth of samples to try, and if this does not help can attempt to obtain Lupron through patient's insurance.  Advised patient to discontinue current OCPs as she has just started a new pack 2 days ago, and can switch to the pills.  -Patient is unable to take NSAIDs due to allergies, and Tylenol does not seem to help her pelvic discomfort.  When offered something stronger for her pain such as Tramadol or Tylenol #3 during her cycles, patient declined. -Patient to return in 1 month to reassess symptoms after use of Orilissa.    A total of 15 minutes were spent face-to-face with the patient during this encounter and over half of that time dealt with counseling and coordination of care.  Hildred Laser, MD [facility]

## 2018-09-05 ENCOUNTER — Encounter: Payer: Self-pay | Admitting: Obstetrics and Gynecology

## 2018-09-06 ENCOUNTER — Other Ambulatory Visit: Payer: Self-pay

## 2018-09-10 ENCOUNTER — Other Ambulatory Visit: Payer: Self-pay

## 2018-09-10 ENCOUNTER — Ambulatory Visit (INDEPENDENT_AMBULATORY_CARE_PROVIDER_SITE_OTHER): Payer: Medicare Other | Admitting: Psychiatry

## 2018-09-10 ENCOUNTER — Encounter: Payer: Self-pay | Admitting: Psychiatry

## 2018-09-10 VITALS — BP 110/72 | HR 92 | Wt 149.0 lb

## 2018-09-10 DIAGNOSIS — F5105 Insomnia due to other mental disorder: Secondary | ICD-10-CM | POA: Diagnosis not present

## 2018-09-10 DIAGNOSIS — F4010 Social phobia, unspecified: Secondary | ICD-10-CM

## 2018-09-10 DIAGNOSIS — F331 Major depressive disorder, recurrent, moderate: Secondary | ICD-10-CM | POA: Diagnosis not present

## 2018-09-10 DIAGNOSIS — R4701 Aphasia: Secondary | ICD-10-CM | POA: Insufficient documentation

## 2018-09-10 MED ORDER — ARIPIPRAZOLE 2 MG PO TABS: 2.0000 mg | ORAL_TABLET | Freq: Every day | ORAL | 1 refills | Status: DC

## 2018-09-10 NOTE — Patient Instructions (Signed)
Aripiprazole tablets What is this medicine? ARIPIPRAZOLE (ay ri PIP ray zole) is an atypical antipsychotic. It is used to treat schizophrenia and bipolar disorder, also known as manic-depression. It is also used to treat Tourette's disorder and some symptoms of autism. This medicine may also be used in combination with antidepressants to treat major depressive disorder. This medicine may be used for other purposes; ask your health care provider or pharmacist if you have questions. COMMON BRAND NAME(S): Abilify What should I tell my health care provider before I take this medicine? They need to know if you have any of these conditions: -dehydration -dementia -diabetes -heart disease -history of stroke -low blood counts, like low white cell, platelet, or red cell counts -Parkinson's disease -seizures -suicidal thoughts, plans, or attempt; a previous suicide attempt by you or a family member -an unusual or allergic reaction to aripiprazole, other medicines, foods, dyes, or preservatives -pregnant or trying to get pregnant -breast-feeding How should I use this medicine? Take this medicine by mouth with a glass of water. Follow the directions on the prescription label. You can take this medicine with or without food. Take your doses at regular intervals. Do not take your medicine more often than directed. Do not stop taking except on the advice of your doctor or health care professional. A special MedGuide will be given to you by the pharmacist with each prescription and refill. Be sure to read this information carefully each time. Talk to your pediatrician regarding the use of this medicine in children. While this drug may be prescribed for children as young as 6 years of age for selected conditions, precautions do apply. Overdosage: If you think you have taken too much of this medicine contact a poison control center or emergency room at once. NOTE: This medicine is only for you. Do not share  this medicine with others. What if I miss a dose? If you miss a dose, take it as soon as you can. If it is almost time for your next dose, take only that dose. Do not take double or extra doses. What may interact with this medicine? Do not take this medicine with any of the following medications: -brexpiprazole -cisapride -dofetilide -dronedarone -metoclopramide -pimozide -thioridazine This medicine may also interact with the following medications: -alcohol -carbamazepine -certain medicines for anxiety or sleep -certain medicines for blood pressure -certain medicines for fungal infections like ketoconazole, fluconazole, posaconazole, and itraconazole -clarithromycin -fluoxetine -other medicines that prolong the QT interval (cause an abnormal heart rhythm) -paroxetine -quinidine -rifampin This list may not describe all possible interactions. Give your health care provider a list of all the medicines, herbs, non-prescription drugs, or dietary supplements you use. Also tell them if you smoke, drink alcohol, or use illegal drugs. Some items may interact with your medicine. What should I watch for while using this medicine? Visit your doctor or health care professional for regular checks on your progress. It may be several weeks before you see the full effects of this medicine. Do not suddenly stop taking this medicine. You may need to gradually reduce the dose. Patients and their families should watch out for worsening depression or thoughts of suicide. Also watch out for sudden changes in feelings such as feeling anxious, agitated, panicky, irritable, hostile, aggressive, impulsive, severely restless, overly excited and hyperactive, or not being able to sleep. If this happens, especially at the beginning of antidepressant treatment or after a change in dose, call your health care professional. You may get dizzy or drowsy. Do   not drive, use machinery, or do anything that needs mental  alertness until you know how this medicine affects you. Do not stand or sit up quickly, especially if you are an older patient. This reduces the risk of dizzy or fainting spells. Alcohol can increase dizziness and drowsiness. Avoid alcoholic drinks. This medicine can reduce the response of your body to heat or cold. Dress warm in cold weather and stay hydrated in hot weather. If possible, avoid extreme temperatures like saunas, hot tubs, very hot or cold showers, or activities that can cause dehydration such as vigorous exercise. This medicine may cause dry eyes and blurred vision. If you wear contact lenses you may feel some discomfort. Lubricating drops may help. See your eye doctor if the problem does not go away or is severe. If you notice an increased hunger or thirst, different from your normal hunger or thirst, or if you find that you have to urinate more frequently, you should contact your health care provider as soon as possible. You may need to have your blood sugar monitored. This medicine may cause changes in your blood sugar levels. You should monitor you blood sugar frequently if you have diabetes. There have been reports of uncontrollable and strong urges to gamble, binge eat, shop, and have sex while taking this medicine. If you experience any of these or other uncontrollable and strong urges while taking this medicine, you should report it to your health care provider as soon as possible. What side effects may I notice from receiving this medicine? Side effects that you should report to your doctor or health care professional as soon as possible: -allergic reactions like skin rash, itching or hives, swelling of the face, lips, or tongue -breathing problems -confusion -feeling faint or lightheaded, falls -fever or chills, sore throat -increased hunger or thirst -increased urination -joint pain -muscles pain, spasms -problems with balance, talking, walking -restlessness or need to  keep moving -seizures -suicidal thoughts or other mood changes -trouble swallowing -uncontrollable and excessive urges (examples: gambling, binge eating, shopping, having sex) -uncontrollable head, mouth, neck, arm, or leg movements -unusually weak or tired Side effects that usually do not require medical attention (report to your doctor or health care professional if they continue or are bothersome): -blurred vision -constipation -headache -nausea, vomiting -trouble sleeping -weight gain This list may not describe all possible side effects. Call your doctor for medical advice about side effects. You may report side effects to FDA at 1-800-FDA-1088. Where should I keep my medicine? Keep out of the reach of children. Store at room temperature between 15 and 30 degrees C (59 and 86 degrees F). Throw away any unused medicine after the expiration date. NOTE: This sheet is a summary. It may not cover all possible information. If you have questions about this medicine, talk to your doctor, pharmacist, or health care provider.  2018 Elsevier/Gold Standard (2016-10-30 11:45:05)  
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
## 2018-09-11 ENCOUNTER — Encounter: Payer: Self-pay | Admitting: Psychiatry

## 2018-09-13 ENCOUNTER — Ambulatory Visit: Payer: Medicare Other | Attending: Obstetrics and Gynecology

## 2018-09-13 DIAGNOSIS — M62838 Other muscle spasm: Secondary | ICD-10-CM

## 2018-09-13 DIAGNOSIS — R293 Abnormal posture: Secondary | ICD-10-CM | POA: Diagnosis not present

## 2018-09-13 DIAGNOSIS — M629 Disorder of muscle, unspecified: Secondary | ICD-10-CM | POA: Diagnosis not present

## 2018-09-13 DIAGNOSIS — M6289 Other specified disorders of muscle: Secondary | ICD-10-CM

## 2018-09-13 DIAGNOSIS — M791 Myalgia, unspecified site: Secondary | ICD-10-CM | POA: Diagnosis not present

## 2018-09-13 NOTE — Patient Instructions (Addendum)
  Do 2 sets of 15 heel squeezes.    Do 2 sets of 15 rotations each direction.

## 2018-09-13 NOTE — Therapy (Signed)
[place] MAIN [facility] SERVICES [address] [place], [place], [address] Phone: [phone]   Fax:  [phone]  Physical Therapy Treatment  Patient Details  Name: Yesenia Wood MRN: [id number] Date of Birth: 02-24-1986 Referring Provider (PT): Rubie Maid   Encounter Date: 09/13/2018  PT End of Session - 09/17/18 0757    Visit Number  15    Number of Visits  24    Date for PT Re-Evaluation  07/31/18    Authorization Type  Medicare    Authorization - Visit Number  6    Authorization - Number of Visits  10    PT Start Time  1430    PT Stop Time  1530    PT Time Calculation (min)  60 min    Activity Tolerance  Patient tolerated treatment well    Behavior During Therapy  [facility] for tasks assessed/performed       Past Medical History:  Diagnosis Date  . ADHD (attention deficit hyperactivity disorder)   . B12 deficiency   . Bell's palsy    right sided  . Chronic migraine   . Deaf   . Depression   . 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 Fatigue   . Fatty liver   . Fibromyalgia   . GERD (gastroesophageal reflux disease)   . Headache   . High triglycerides   . Hyperglycemia   . Hypertension   . Hyperthyroidism   . Moderate asthma   . Obesity   . Paresthesia   . Premature birth     Past Surgical History:  Procedure Laterality Date  . BARTHOLIN GLAND CYST REMOVAL    . COCHLEAR IMPLANT    . COCHLEAR IMPLANT REMOVAL    . LAPAROSCOPIC TUBAL LIGATION Bilateral 03/26/2018   Procedure: LAPAROSCOPIC TUBAL LIGATION;  Surgeon: Rubie Maid, MD;  Location: [facility] ORS;  Service: Gynecology;  Laterality: Bilateral;  with peritoneal biopsies  . LAPAROSCOPY    . MYRINGOTOMY WITH TUBE PLACEMENT    . TUBAL LIGATION  03/26/2018    There were no vitals filed for this visit.    Pelvic Floor Physical Therapy Treatment Note  SCREENING  Changes in medications, allergies, or medical  history?: yes, updated in medicines.    SUBJECTIVE  Patient reports: She has been busy and sick, this time of year makes her tired. She has been sleeping in her bed with a big wrap-around pillow and this helps.  Precautions:  Depression, deafness, fibromyalgia  Pain update:  Location of pain: Low back near pelvis and lower abdomen. Current pain:  2/10  Max pain:  5/10 Least pain:  2/10 Nature of pain: achy  Patient Goals: Be able to walk comfortably and do activities around the house without pain.   OBJECTIVE  Changes in:  Range of Motion/Flexibilty:  Decreased stability of R SIJ with stork/weight shift. C2 vertebrae rotated L. Decreased lateral glide at C3-C4Cervical rotation limited by ~ 50% B pre-treatment, ~ 25% following treatment.   Palpation: TTP through R>L SCM, cervical extensors and multifidus.  INTERVENTIONS THIS SESSION: Manual: Performed TP release to R SCM and B cervical extensors and multifidus to decrease spasm pulling the head into abnormal cervical alignment and to decrease overall sensitivity of neurological system for decreased pain and increased function. Exercise: Educated on and practiced prone heel-squeezes to improve stability at SIJ and maintain appropriate mobility. Educuated on and practiced cervical rotational mobs to further improve ROM  and decrease pressure on nerve roots for decreased pain and overall neural sensitivity.  Total time: 60 min.                          PT Education - 09/17/18 0756    Education provided  Yes    Education Details  See Pt. Instructions and Interventions this session.    Person(s) Educated  Patient    Methods  Explanation;Demonstration;Verbal cues;Handout   via translator   Comprehension  Verbalized understanding;Returned demonstration;Verbal cues required   via translator      PT Short Term Goals - 09/17/18 0802      PT SHORT TERM GOAL #1   Title  Patient will demonstrate improved  pelvic allignment in standing and supine for improved pelvic position for PFM length/tension relationship.    Time  5    Period  Weeks    Status  Achieved    Target Date  01/15/18      PT SHORT TERM GOAL #2   Title  Patient will undergo Internal/External assessment of the PFM to allow or direct treatment of PFM spasms.    Baseline  Pt. is avoidant of all internal and external pelvic exam    Time  5    Period  Weeks    Status  On-going    Target Date  01/15/18      PT SHORT TERM GOAL #3   Title  Patient will demonstrate appropriate body mechanics with bending and lifting heavy objects to allow for decreased stress on the pelvic floor and low back.     Time  5    Period  Weeks    Status  Achieved    Target Date  01/15/18      PT SHORT TERM GOAL #4   Title  Patient will perform self external TP release to decrease spasm and sensitivity of nerves to allow for ecreased pain.    Baseline  Pt. has reported trying this at home but seems to still be avoidant of touch.    Time  5    Period  Weeks    Status  Partially Met    Target Date  07/31/18      PT SHORT TERM GOAL #5   Title  Patient will describe Max pain of no greater than 3/10 over prior week to demonstrate symptom improvement and improved quality of life.    Time  5    Period  Weeks    Status  On-going    Target Date  11/26/18        PT Long Term Goals - 09/17/18 0800      PT LONG TERM GOAL #1   Title  Patient will score at or below 8/43 on the Female NIH-CPSI and 5 on the VQ to demonstrate a clinically meaningful decrease in disability and distress due to pelvic floor dysfunction.    Baseline  NIH- CPSI: 18/43, VQ: 10/33     Time  10    Period  Weeks    Status  On-going      PT LONG TERM GOAL #2   Title  Patient will demonstrate ability to use a tool for self TP release to allow for maintenence of spasm release upon D/C.    Time  10    Period  Weeks    Status  On-going    Target Date  11/26/18      PT LONG TERM  GOAL #3   Title  Patient will report a reduction in pain to no greater than 2/10 over the prior week to demonstrate symptom improvement.    Time  10    Period  Weeks    Status  On-going    Target Date  11/26/18      PT LONG TERM GOAL #4   Title  Patient will demonstrate improved sitting and standing posture to demonstrate learning and decrease stress on the pelvic floor with functional activity.    Time  10    Period  Weeks    Status  Achieved      PT LONG TERM GOAL #5   Title  Patient will perform regular exercise over the prior 2 weeks to demonstrate improved QOL and ability to maintain symptom improvement upon D/C    Time  10    Period  Weeks    Status  Achieved    Target Date  07/31/18            Plan - 09/17/18 0758    Clinical Impression Statement  Pt. has made progress in her pain levels and activity since returning to PT following her tubla ligation, which had aggrivated unresolved underlying pelvic pain again after progress was made pre-surgery. Her pain levels were hovering around 7-8/10 at worst and  4/10 at best and are now 5/10 at worst and 2/10 at best, but she has also been able to significantly increase her activity levels without an increase in pain. Her posture and muscular balance surrounding her pelvis are continuing to improve but she responds slowly to treatment and due to no fault of her own she has had a gap in her treatment sessions, due to scheduling difficulties and an interpreter no-show. Patient has suffered from migraines and fibromyalgia and it has become evident with time that we will need to treat her neck and head malalignment to decrease neural sensitivity from pressure on cranial structures effecting caudal structure sensitivity. With the complexity of this patient's case and her slow but steady progress, I believe it is important that we continue to treat her to build on progress and improve pain and function to prevent potential ramifications on all  systems if she has an increase in pain.     History and Personal Factors relevant to plan of care:  Deaf, multiple co-morbidities.    Clinical Presentation  Stable    Clinical Presentation due to:  Pt. has made slow but steady progress in pain and function since returning to PT.    Clinical Decision Making  High    Rehab Potential  Fair    Clinical Impairments Affecting Rehab Potential  Deaf, multiple co-morbidities, fibromyalgia, fear of direct pelvic exam and treatment.    PT Frequency  Other (comment)   every-other week   PT Duration  Other (comment)   10 weeks   PT Treatment/Interventions  ADLs/Self Care Home Management;Aquatic Therapy;Biofeedback;Moist Heat;Traction;Ultrasound;Balance training;Gait training;Functional mobility training;Therapeutic activities;Therapeutic exercise;Neuromuscular re-education;Patient/family education;Orthotic Fit/Training;Scar mobilization;Manual techniques;Manual lymph drainage;Dry needling;Taping    PT Next Visit Plan  Ask about home TP release success, add pelvic tilts and squats, educate on scar massage, re-assess spine restrictions in cervical and at T-L junction    PT Home Exercise Plan  diaphragmatic breathing, TA in quadruped, child's pose, adductor stretch, piriformis stretch, squatty potty, L side stretch, hip abduction, tall posture walking.  Bird-dog, multi-stepping, mini-marches, prone press-ups, TP release with lacrosse ball., Heels squeeze in Prone    Consulted and Agree  with Plan of Care  Patient       Patient will benefit from skilled therapeutic intervention in order to improve the following deficits and impairments:  Dizziness, Increased fascial restricitons, Impaired sensation, Improper body mechanics, Pain, Decreased coordination, Hypermobility, Postural dysfunction, Increased muscle spasms, Impaired tone, Decreased balance, Difficulty walking  Visit Diagnosis: Myalgia  Other muscle spasm  Abnormal posture  Muscular  imbalance     Problem List Patient Active Problem List   Diagnosis Date Noted  . 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 OSA (obstructive sleep apnea) 10/10/2016  . Other insomnia 10/10/2016  . Dissocial personality disorder ([place]) 06/07/2016  . Sleep disturbance 05/10/2016  . Chronic migraine without aura 03/29/2016  . Costochondral chest pain 12/28/2015  . Pain in the chest 06/09/2015  . Palpitations 06/09/2015  . Deaf 05/16/2015  . B12 deficiency 05/16/2015  . Cochlear implant status 05/16/2015  . Fatty infiltration of liver 05/16/2015  . Fibromyalgia syndrome 05/16/2015  . Gastro-esophageal reflux disease without esophagitis 05/16/2015  . Generalized headache 05/16/2015  . Right-sided Bell's palsy 05/16/2015  . H/O: depression 05/16/2015  . Hypertriglyceridemia 05/16/2015  . Blood glucose elevated 05/16/2015  . Irregular menstrual cycle 05/16/2015  . Overweight 05/16/2015  . Paresthesia 05/16/2015  . Disorder of labyrinth 05/16/2015  . Dyspnea 10/07/2014  . Moderate persistent intrinsic asthma without status asthmaticus without complication 28/90/2284   Willa Rough DPT, ATC Willa Rough 09/17/2018, 9:41 AM  [place] MAIN [facility] SERVICES [address] [place], [place], [address] Phone: [phone]   Fax:  [phone]  Name: Yesenia Wood MRN: [id number] Date of Birth: 1986-01-24

## 2018-09-16 ENCOUNTER — Other Ambulatory Visit: Payer: Self-pay | Admitting: Family Medicine

## 2018-09-16 DIAGNOSIS — R109 Unspecified abdominal pain: Secondary | ICD-10-CM

## 2018-09-20 ENCOUNTER — Telehealth: Payer: Self-pay

## 2018-09-20 NOTE — Telephone Encounter (Signed)
Called Yesenia Wood and spoke with them to see if they received the pt's form that was faxed in on Tuesday. They stated the form had been received and is being reviewed. Pt, pt's insurance and our office will be notified as soon as the process is completed.

## 2018-09-24 ENCOUNTER — Ambulatory Visit (INDEPENDENT_AMBULATORY_CARE_PROVIDER_SITE_OTHER): Payer: Medicare Other | Admitting: Licensed Clinical Social Worker

## 2018-09-24 ENCOUNTER — Other Ambulatory Visit: Payer: Self-pay

## 2018-09-24 ENCOUNTER — Encounter: Payer: Self-pay | Admitting: Licensed Clinical Social Worker

## 2018-09-24 ENCOUNTER — Encounter: Payer: Self-pay | Admitting: Psychiatry

## 2018-09-24 ENCOUNTER — Ambulatory Visit (INDEPENDENT_AMBULATORY_CARE_PROVIDER_SITE_OTHER): Payer: Medicare Other | Admitting: Psychiatry

## 2018-09-24 DIAGNOSIS — F331 Major depressive disorder, recurrent, moderate: Secondary | ICD-10-CM
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
## 2018-09-25 ENCOUNTER — Ambulatory Visit (INDEPENDENT_AMBULATORY_CARE_PROVIDER_SITE_OTHER): Payer: Medicare Other | Admitting: Family Medicine

## 2018-09-25 ENCOUNTER — Encounter: Payer: Self-pay | Admitting: Family Medicine

## 2018-09-25 VITALS — BP 118/62 | HR 96 | Temp 98.5°F | Resp 16 | Ht 63.0 in | Wt 143.3 lb

## 2018-09-25 DIAGNOSIS — F330 Major depressive disorder, recurrent, mild: Secondary | ICD-10-CM

## 2018-09-25 DIAGNOSIS — E538 Deficiency of other specified B group vitamins: Secondary | ICD-10-CM | POA: Diagnosis not present

## 2018-09-25 DIAGNOSIS — J4540 Moderate persistent asthma, uncomplicated: Secondary | ICD-10-CM | POA: Diagnosis not present

## 2018-09-25 DIAGNOSIS — M797 Fibromyalgia: Secondary | ICD-10-CM

## 2018-09-25 DIAGNOSIS — Z23 Encounter for immunization: Secondary | ICD-10-CM | POA: Diagnosis not present

## 2018-09-25 DIAGNOSIS — R110 Nausea: Secondary | ICD-10-CM | POA: Diagnosis not present

## 2018-09-25 DIAGNOSIS — Z79899 Other long term (current) drug therapy: Secondary | ICD-10-CM | POA: Diagnosis not present

## 2018-09-25 DIAGNOSIS — G43709 Chronic migraine without aura, not intractable, without status migrainosus: Secondary | ICD-10-CM | POA: Diagnosis not present

## 2018-09-25 DIAGNOSIS — E781 Pure hyperglyceridemia: Secondary | ICD-10-CM

## 2018-09-25 DIAGNOSIS — K760 Fatty (change of) liver, not elsewhere classified: Secondary | ICD-10-CM | POA: Diagnosis not present

## 2018-09-25 DIAGNOSIS — H9193 Unspecified hearing loss, bilateral: Secondary | ICD-10-CM | POA: Diagnosis not present

## 2018-09-25 MED ORDER — CYANOCOBALAMIN 1000 MCG/ML IJ SOLN
1000.0000 ug | Freq: Once | INTRAMUSCULAR | Status: AC
  Administered 2018-11-23: 1000 ug via INTRAMUSCULAR

## 2018-09-25 MED ORDER — BENZONATATE 100 MG PO CAPS: 100.0000 mg | ORAL_CAPSULE | Freq: Two times a day (BID) | ORAL | 0 refills | Status: DC | PRN

## 2018-09-25 NOTE — Progress Notes (Signed)
Pt had to reschedule since ASL interpreter was not available for this encounter. Pt is deaf and mute.

## 2018-09-25 NOTE — Progress Notes (Signed)
Name: Yesenia Wood   MRN: [id number]    DOB: 11-Jan-1986   Date:09/25/2018       Progress Note  Subjective  Chief Complaint  Chief Complaint  Patient presents with  . Medication Refill  . Depression  . Migraine    Experienced side effects from Botox injections-Dr. Harlan Stains. Neck Pain and Spasms  . Fibromyalgia    Unchanged   . Asthma    Worst with allergies starting  . Insomnia    Worst with the new medication Abilify 2 mg-only able to sleep 2 hours night, very annoyed   . Migraine    Has improved some with her diet changes-with limiting meat amount    HPI  Asthma: sees Dr. Linward Natal, She is intolerant to a lot of medications. She is not on medication for asthma, since it was not helping with symptoms.  She has SOB and wheezing intermittently but feeling a little worse recently because of allergies. Only taking benadryl otc for asthma. She has a dry cough at this time.   FMS: she has paresthesia, daily pain, she tried PT without Improvement of symptoms. She has been feeling more tired lately, she sates she tries to stay awake but falls asleep during the day. She had a sleep study in the past. Unchanged   B12 deficiency: with paresthesia, she has not been able to come in recently for B12 injection but would like it today.   Palpitation: on metoprolol since 2019 and seems to help with her SOB.  She states palpitation and breathing seems to be under control with medication  Migraine: goes to [facility], follows with Dr. Haskell Riling , she had Botox since last visit but it caused neck spasms and she is not sure of the next step.   Major Depression: she is now seeing psychiatrist, Dr. Elna Breslow, and also therapist. She is currently on Abilify and has affected her sleep. Causing interrupted sleep. She just started medication recently. She is also still on Zoloft. She has a follow up coming up soon.   RUQ pain/nausea/aversion to meat: she has a history of fatty liver, denies tick  bites [place] farm, she has RUQ pain intermittent not always associated with meals.   Patient Active Problem List   Diagnosis Date Noted  . 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 OSA (obstructive sleep apnea) 10/10/2016  . Other insomnia 10/10/2016  . Dissocial personality disorder (HCC) 06/07/2016  . Sleep disturbance 05/10/2016  . Chronic migraine without aura 03/29/2016  . Costochondral chest pain 12/28/2015  . Pain in the chest 06/09/2015  . Palpitations 06/09/2015  . Deaf 05/16/2015  . B12 deficiency 05/16/2015  . Cochlear implant status 05/16/2015  . Fatty infiltration of liver 05/16/2015  . Fibromyalgia syndrome 05/16/2015  . Gastro-esophageal reflux disease without esophagitis 05/16/2015  . Generalized headache 05/16/2015  . Right-sided Bell's palsy 05/16/2015  . H/O: depression 05/16/2015  . Hypertriglyceridemia 05/16/2015  . Blood glucose elevated 05/16/2015  . Irregular menstrual cycle 05/16/2015  . Overweight 05/16/2015  . Paresthesia 05/16/2015  . Disorder of labyrinth 05/16/2015  . Dyspnea 10/07/2014  . Moderate persistent intrinsic asthma without status asthmaticus without complication 10/07/2014    Past Surgical History:  Procedure Laterality Date  . BARTHOLIN GLAND CYST REMOVAL    . COCHLEAR IMPLANT    . COCHLEAR IMPLANT REMOVAL    . LAPAROSCOPIC TUBAL LIGATION Bilateral 03/26/2018   Procedure: LAPAROSCOPIC TUBAL LIGATION;  Surgeon: Hildred Laser, MD;  Location: [facility] ORS;  Service: Gynecology;  Laterality: Bilateral;  with peritoneal biopsies  . LAPAROSCOPY    . MYRINGOTOMY WITH TUBE PLACEMENT    . TUBAL LIGATION  03/26/2018    Family History  Adopted: Yes  Family history unknown: Yes    Social History   Socioeconomic History  . Marital status: Single    Spouse name: Not on file  . Number of children: 0  . Years of education: Not on file  . Highest education level: Some college, no degree  Occupational History  . Occupation: disability for hearing  loss  Social Needs  . Financial resource strain: Somewhat hard  . Food insecurity:    Worry: Never true    Inability: Never true  . Transportation needs:    Medical: No    Non-medical: No  Tobacco Use  . Smoking status: Former Smoker    Packs/day: 1.00    Years: 2.00    Pack years: 2.00    Types: Cigarettes    Start date: 11/28/2004    Last attempt to quit: 11/28/2006    Years since quitting: 11.8  . Smokeless tobacco: Never Used  Substance and Sexual Activity  . Alcohol use: Yes    Alcohol/week: 0.0 standard drinks    Frequency: Never    Comment: occass  . Drug use: No  . Sexual activity: Not Currently    Birth control/protection: Pill    Comment: Seasonale  Lifestyle  . Physical activity:    Days per week: 7 days    Minutes per session: 150+ min  . Stress: To some extent  Relationships  . Social connections:    Talks on phone: Once a week    Gets together: Never    Attends religious service: Never    Active member of club or organization: No    Attends meetings of clubs or organizations: Never    Relationship status: Never married  . Intimate partner violence:    Fear of current or ex partner: No    Emotionally abused: No    Physically abused: No    Forced sexual activity: No  Other Topics Concern  . Not on file  Social History Narrative   Lives with her adopted  parents   Hearing loss since birth      Current Outpatient Medications:  .  acetaminophen (TYLENOL) 500 MG tablet, Take 1,000 mg by mouth every 8 (eight) hours as needed for headache., Disp: , Rfl:  .  ARIPiprazole (ABILIFY) 2 MG tablet, Take 1 tablet (2 mg total) by mouth daily., Disp: 30 tablet, Rfl: 1 .  dicyclomine (BENTYL) 20 MG tablet, TAKE 1 TABLET BY MOUTH TWICE A DAY AS NEEDED FOR SPASMS, Disp: 180 tablet, Rfl: 1 .  diltiazem (CARDIZEM CD) 120 MG 24 hr capsule, Take 1 capsule (120 mg total) by mouth daily., Disp: 90 capsule, Rfl: 3 .  diphenhydrAMINE (BENADRYL) 50 MG tablet, Take 50 mg by  mouth at bedtime as needed for itching., Disp: , Rfl:  .  metoprolol succinate (TOPROL-XL) 25 MG 24 hr tablet, Take 1 tablet (25 mg total) by mouth daily. Take with or immediately following a meal., Disp: 90 tablet, Rfl: 3 .  rosuvastatin (CRESTOR) 20 MG tablet, TAKE 1 TABLET BY MOUTH EVERY DAY, Disp: 90 tablet, Rfl: 0 .  sertraline (ZOLOFT) 100 MG tablet, TAKE 1 TABLET BY MOUTH EVERY DAY, Disp: 90 tablet, Rfl: 0 .  Elagolix Sodium (ORILISSA) 150 MG TABS, Take 1 tablet by mouth daily. (Patient not taking: Reported on 09/25/2018), Disp: 30 tablet, Rfl: 0 .  levonorgestrel-ethinyl estradiol (SEASONALE,INTROVALE,JOLESSA) 0.15-0.03 MG tablet, Take 1 tablet by mouth daily. (Patient not taking: Reported on 09/25/2018), Disp: 1 Package, Rfl: 4  Allergies  Allergen Reactions  . Budesonide-Formoterol Fumarate Anaphylaxis and Other (See Comments)    Other reaction(s): Other (See Comments) chest pain Chest pain chest pain  . Astelin [Azelastine]     Tingling, numbness, nausea  . Gabapentin Nausea And Vomiting  . Nortriptyline Other (See Comments)    Other reaction(s): Other (See Comments)  . Diclofenac Other (See Comments)    Other reaction(s): Other (See Comments)  . Duloxetine Hcl Other (See Comments)    Other reaction(s): Other (See Comments)  . Montelukast Sodium Other (See Comments)  . Mupirocin Other (See Comments)  . Naproxen Other (See Comments) and Rash  . Naproxen Sodium Rash    mild rash  . Oxycodone Hcl Rash  . Sulfa Antibiotics Hives and Rash    I personally reviewed active problem list, medication list, allergies, family history, social history with the patient/caregiver today.   ROS  Ten systems reviewed and is negative except as mentioned in HPI   Objective  Vitals:   09/25/18 1002  BP: 118/62  Pulse: 96  Resp: 16  Temp: 98.5 F (36.9 C)  TempSrc: Oral  SpO2: 98%  Weight: 143 lb 4.8 oz (65 kg)  Height: 5\' 3"  (1.6 m)    Body mass index is 25.38  kg/m.  Physical Exam  Constitutional: Patient appears well-developed and well-nourished.  No distress.  HEENT: head atraumatic, normocephalic, pupils equal and reactive to light, neck supple, throat within normal limits Cardiovascular: Normal rate, regular rhythm and normal heart sounds.  No murmur heard. No BLE edema. Pulmonary/Chest: Effort normal and breath sounds normal. No respiratory distress. Abdominal: Soft.  There is mild right lower abdominal  Tenderness ( seeing GYN ) . Psychiatric: Patient has a normal mood and affect. behavior is normal. Judgment and thought content normal.  PHQ2/9: Depression screen [facility] 2/9 09/25/2018 06/22/2018 03/19/2018 01/29/2018 04/11/2017  Decreased Interest 2 1 1 1  0  Down, Depressed, Hopeless 1 1 1 1 1   PHQ - 2 Score 3 2 2 2 1   Altered sleeping 3 3 3 3  -  Tired, decreased energy 3 3 1 3  -  Change in appetite 2 0 0 0 -  Feeling bad or failure about yourself  1 0 0 0 -  Trouble concentrating 2 2 1  0 -  Moving slowly or fidgety/restless 3 2 1 1  -  Suicidal thoughts 0 0 0 0 -  PHQ-9 Score 17 12 8 9  -  Difficult doing work/chores Somewhat difficult Somewhat difficult Not difficult at all Somewhat difficult -     Fall Risk: Fall Risk  09/25/2018 06/22/2018 03/19/2018 10/26/2017 04/11/2017  Falls in the past year? No No No No No  Number falls in past yr: - - - - -  Injury with Fall? - - - - -  Comment - - - - -  Risk Factor Category  - - - - -     Functional Status Survey: Is the patient deaf or have difficulty hearing?: Yes(Patient is Deaf) Does the patient have difficulty seeing, even when wearing glasses/contacts?: Yes(glasses) Does the patient have difficulty concentrating, remembering, or making decisions?: No Does the patient have difficulty walking or climbing stairs?: No Does the patient have difficulty dressing or bathing?: No Does the patient have difficulty doing errands alone such as visiting a doctor's office or shopping?:  Yes    Assessment &  Plan  1. Moderate persistent intrinsic asthma without status asthmaticus without complication  Continue follow up with Dr. Jonnie Kind, we will give her tessalon pereles  2. Need for immunization against influenza  - Flu Vaccine QUAD 6+ mos PF IM (Fluarix Quad PF)  3. Depression, major, recurrent, mild (HCC)  Under the care of Dr. Elna Breslow and also therapist  4. Fibromyalgia syndrome  Stable  5. Chronic migraine without aura without status migrainosus, not intractable  Keep follow up with Dr. Neale Burly  6. B12 deficiency  - Vitamin B12 - cyanocobalamin ((VITAMIN B-12)) injection 1,000 mcg  7. Hypertriglyceridemia  - Lipid panel  8. Bilateral deafness  She had a translator today   9. Long-term use of high-risk medication  - COMPLETE METABOLIC PANEL WITH GFR  10. Fatty liver  -referral GI   11. Nausea  Recurrent and having aversion to meat now

## 2018-09-26 ENCOUNTER — Ambulatory Visit (INDEPENDENT_AMBULATORY_CARE_PROVIDER_SITE_OTHER): Payer: Medicare Other

## 2018-09-26 ENCOUNTER — Ambulatory Visit: Payer: Medicare Other

## 2018-09-26 DIAGNOSIS — Z79899 Other long term (current) drug therapy: Secondary | ICD-10-CM | POA: Diagnosis not present

## 2018-09-26 DIAGNOSIS — E781 Pure hyperglyceridemia: Secondary | ICD-10-CM | POA: Diagnosis not present

## 2018-09-26 DIAGNOSIS — E538 Deficiency of other specified B group vitamins: Secondary | ICD-10-CM | POA: Diagnosis not present

## 2018-09-26 MED ORDER — CYANOCOBALAMIN 1000 MCG/ML IJ SOLN
1000.0000 ug | Freq: Once | INTRAMUSCULAR | Status: AC
  Administered 2018-09-26: 1000 ug via INTRAMUSCULAR

## 2018-09-27 ENCOUNTER — Other Ambulatory Visit: Payer: Self-pay

## 2018-09-27 ENCOUNTER — Ambulatory Visit (INDEPENDENT_AMBULATORY_CARE_PROVIDER_SITE_OTHER): Payer: Medicare Other | Admitting: Psychiatry

## 2018-09-27 ENCOUNTER — Encounter: Payer: Self-pay | Admitting: Psychiatry

## 2018-09-27 VITALS — BP 112/76 | HR 81 | Temp 99.0°F | Wt 148.6 lb

## 2018-09-27 DIAGNOSIS — F5105 Insomnia due to other mental disorder: Secondary | ICD-10-CM

## 2018-09-27 DIAGNOSIS — F4010 Social phobia, unspecified: Secondary | ICD-10-CM | POA: Diagnosis not present

## 2018-09-27 DIAGNOSIS — F331 Major depressive disorder, recurrent, moderate: Secondary | ICD-10-CM

## 2018-09-27 LAB — LIPID PANEL
CHOLESTEROL: 131 mg/dL (ref <200)
HDL: 45 mg/dL — ABNORMAL LOW (ref >50)
LDL Cholesterol (Calc): 69 mg/dL (calc)
Non-HDL Cholesterol (Calc): 86 mg/dL (calc) (ref <130)
Total CHOL/HDL Ratio: 2.9 (calc) (ref <5.0)
Triglycerides: 87 mg/dL (ref <150)

## 2018-09-27 LAB — COMPLETE METABOLIC PANEL WITH GFR
AG Ratio: 2.2 (calc) (ref 1.0–2.5)
ALBUMIN MSPROF: 4.8 g/dL (ref 3.6–5.1)
ALT: 36 U/L — ABNORMAL HIGH (ref 6–29)
AST: 32 U/L — ABNORMAL HIGH (ref 10–30)
Alkaline phosphatase (APISO): 55 U/L (ref 33–115)
BUN: 12 mg/dL (ref 7–25)
CALCIUM: 9.5 mg/dL (ref 8.6–10.2)
CO2: 27 mmol/L (ref 20–32)
CREATININE: 0.81 mg/dL (ref 0.50–1.10)
Chloride: 104 mmol/L (ref 98–110)
GFR, EST AFRICAN AMERICAN: 111 mL/min/{1.73_m2} (ref >=60)
GFR, EST NON AFRICAN AMERICAN: 96 mL/min/{1.73_m2} (ref >=60)
GLOBULIN: 2.2 g/dL (ref 1.9–3.7)
GLUCOSE: 84 mg/dL (ref 65–99)
Potassium: 4.1 mmol/L (ref 3.5–5.3)
SODIUM: 139 mmol/L (ref 135–146)
TOTAL PROTEIN: 7 g/dL (ref 6.1–8.1)
Total Bilirubin: 0.4 mg/dL (ref 0.2–1.2)

## 2018-09-27 LAB — VITAMIN B12: Vitamin B-12: 464 pg/mL (ref 200–1100)

## 2018-09-27 MED ORDER — BUSPIRONE HCL 7.5 MG PO TABS: 7.5000 mg | ORAL_TABLET | Freq: Two times a day (BID) | ORAL | 0 refills | Status: DC

## 2018-09-27 MED ORDER — SERTRALINE HCL 100 MG PO TABS
100.0000 mg | ORAL_TABLET | Freq: Every day | ORAL | 0 refills | Status: DC
Start: 2018-09-27 — End: 2018-10-11

## 2018-09-27 NOTE — Patient Instructions (Signed)
Buspirone tablets What is this medicine? BUSPIRONE (byoo SPYE rone) is used to treat anxiety disorders. This medicine may be used for other purposes; ask your health care provider or pharmacist if you have questions. COMMON BRAND NAME(S): BuSpar What should I tell my health care provider before I take this medicine? They need to know if you have any of these conditions: -kidney or liver disease -an unusual or allergic reaction to buspirone, other medicines, foods, dyes, or preservatives -pregnant or trying to get pregnant -breast-feeding How should I use this medicine? Take this medicine by mouth with a glass of water. Follow the directions on the prescription label. You may take this medicine with or without food. To ensure that this medicine always works the same way for you, you should take it either always with or always without food. Take your doses at regular intervals. Do not take your medicine more often than directed. Do not stop taking except on the advice of your doctor or health care professional. Talk to your pediatrician regarding the use of this medicine in children. Special care may be needed. Overdosage: If you think you have taken too much of this medicine contact a poison control center or emergency room at once. NOTE: This medicine is only for you. Do not share this medicine with others. What if I miss a dose? If you miss a dose, take it as soon as you can. If it is almost time for your next dose, take only that dose. Do not take double or extra doses. What may interact with this medicine? Do not take this medicine with any of the following medications: -linezolid -MAOIs like Carbex, Eldepryl, Marplan, Nardil, and Parnate -methylene blue -procarbazine This medicine may also interact with the following medications: -diazepam -digoxin -diltiazem -erythromycin -grapefruit juice -haloperidol -medicines for mental depression or mood problems -medicines for seizures like  carbamazepine, phenobarbital and phenytoin -nefazodone -other medications for anxiety -rifampin -ritonavir -some antifungal medicines like itraconazole, ketoconazole, and voriconazole -verapamil -warfarin This list may not describe all possible interactions. Give your health care provider a list of all the medicines, herbs, non-prescription drugs, or dietary supplements you use. Also tell them if you smoke, drink alcohol, or use illegal drugs. Some items may interact with your medicine. What should I watch for while using this medicine? Visit your doctor or health care professional for regular checks on your progress. It may take 1 to 2 weeks before your anxiety gets better. You may get drowsy or dizzy. Do not drive, use machinery, or do anything that needs mental alertness until you know how this drug affects you. Do not stand or sit up quickly, especially if you are an older patient. This reduces the risk of dizzy or fainting spells. Alcohol can make you more drowsy and dizzy. Avoid alcoholic drinks. What side effects may I notice from receiving this medicine? Side effects that you should report to your doctor or health care professional as soon as possible: -blurred vision or other vision changes -chest pain -confusion -difficulty breathing -feelings of hostility or anger -muscle aches and pains -numbness or tingling in hands or feet -ringing in the ears -skin rash and itching -vomiting -weakness Side effects that usually do not require medical attention (report to your doctor or health care professional if they continue or are bothersome): -disturbed dreams, nightmares -headache -nausea -restlessness or nervousness -sore throat and nasal congestion -stomach upset This list may not describe all possible side effects. Call your doctor for medical advice about side   effects. You may report side effects to FDA at 1-800-FDA-1088. Where should I keep my medicine? Keep out of the reach  of children. Store at room temperature below 30 degrees C (86 degrees F). Protect from light. Keep container tightly closed. Throw away any unused medicine after the expiration date. NOTE: This sheet is a summary. It may not cover all possible information. If you have questions about this medicine, talk to your doctor, pharmacist, or health care provider.  2018 Elsevier/Gold Standard (2010-06-24 18:06:11)  

## 2018-09-27 NOTE — Progress Notes (Signed)
[facility] MD OP Progress Note  09/27/2018 4:03 PM LASHAE WOLLENBERG  MRN:  [id number]  Chief Complaint: ' I am here for follow up.' Chief Complaint    Follow-up     HPI: Yesenia Wood is a 32 year old Caucasian female, unemployed, single, lives in [place], has a history of depression, anxiety, insomnia, migraine headaches, B12 deficiency, bilateral hearing loss, tachycardia, is mute, presented to the clinic today for a follow-up visit.  Patient presented along with an ASL interpreter who assisted with evaluation today-Kayla.  Patient today reports she has noticed some side effects to the Abilify.  She reports ever since being on the Abilify her migraine headaches have worsened.  Patient also reports sleep is extremely restless.  Patient reports she has also noticed that her hallucinations have gotten worse.  She reports she sees stuff moving out of the corner of her eyes and also has been hearing some noises in her head.  Patient reports she has lived with these kind of hallucinations all her life.  She reports she distracts herself and is able to cope with it usually.  However since being on the Abilify it has been more frustrating.  Patient reports she continues to struggle with anxiety symptoms.  She agrees to medication changes today.  Discussed adding BuSpar to her Zoloft.  She agrees with plan.  Patient will continue psychotherapy sessions with Ms. [name].  Patient denies any other concerns today. Visit Diagnosis:    ICD-10-CM   1. MDD (major depressive disorder), recurrent episode, moderate (HCC) F33.1 busPIRone (BUSPAR) 7.5 MG tablet  2. Social anxiety disorder F40.10 busPIRone (BUSPAR) 7.5 MG tablet  3. Insomnia due to mental condition F51.05     Past Psychiatric History: Have reviewed past psychiatric history from my progress note on 09/10/2018.  Past trials of Zoloft, Cymbalta, Pamelor, gabapentin, Abilify.  Past Medical History:  Past Medical History:  Diagnosis Date  . ADHD  (attention deficit hyperactivity disorder)   . B12 deficiency   . Bell's palsy    right sided  . Chronic migraine   . Deaf   . Depression   . 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 Fatigue   . Fatty liver   . Fibromyalgia   . GERD (gastroesophageal reflux disease)   . Headache   . High triglycerides   . Hyperglycemia   . Hypertension   . Hyperthyroidism   . Moderate asthma   . Obesity   . Paresthesia   . Premature birth     Past Surgical History:  Procedure Laterality Date  . BARTHOLIN GLAND CYST REMOVAL    . COCHLEAR IMPLANT    . COCHLEAR IMPLANT REMOVAL    . LAPAROSCOPIC TUBAL LIGATION Bilateral 03/26/2018   Procedure: LAPAROSCOPIC TUBAL LIGATION;  Surgeon: Hildred Laser, MD;  Location: [facility] ORS;  Service: Gynecology;  Laterality: Bilateral;  with peritoneal biopsies  . LAPAROSCOPY    . MYRINGOTOMY WITH TUBE PLACEMENT    . TUBAL LIGATION  03/26/2018    Family Psychiatric History: Have reviewed family psychiatric history from my progress note on 09/10/2018.  Family History:  Family History  Adopted: Yes  Family history unknown: Yes    Social History: Reviewed social history from my progress note on 09/10/2018. Social History   Socioeconomic History  . Marital status: Single    Spouse name: Not on file  . Number of children: 0  . Years of education: Not on file  . Highest education level:  Some college, no degree  Occupational History  . Occupation: disability for hearing loss  Social Needs  . Financial resource strain: Somewhat hard  . Food insecurity:    Worry: Never true    Inability: Never true  . Transportation needs:    Medical: No    Non-medical: No  Tobacco Use  . Smoking status: Former Smoker    Packs/day: 1.00    Years: 2.00    Pack years: 2.00    Types: Cigarettes    Start date: 11/28/2004    Last attempt to quit: 11/28/2006    Years since quitting: 11.8  . Smokeless tobacco: Never Used   Substance and Sexual Activity  . Alcohol use: Yes    Alcohol/week: 0.0 standard drinks    Frequency: Never    Comment: occass  . Drug use: No  . Sexual activity: Not Currently    Birth control/protection: Pill    Comment: Seasonale  Lifestyle  . Physical activity:    Days per week: 7 days    Minutes per session: 150+ min  . Stress: To some extent  Relationships  . Social connections:    Talks on phone: Once a week    Gets together: Never    Attends religious service: Never    Active member of club or organization: No    Attends meetings of clubs or organizations: Never    Relationship status: Never married  Other Topics Concern  . Not on file  Social History Narrative   Lives with her adopted  parents   Hearing loss since birth     Allergies:  Allergies  Allergen Reactions  . Budesonide-Formoterol Fumarate Anaphylaxis and Other (See Comments)    Other reaction(s): Other (See Comments) chest pain Chest pain chest pain  . Astelin [Azelastine]     Tingling, numbness, nausea  . Gabapentin Nausea And Vomiting  . Nortriptyline Other (See Comments)    Other reaction(s): Other (See Comments)  . Diclofenac Other (See Comments)    Other reaction(s): Other (See Comments)  . Duloxetine Hcl Other (See Comments)    Other reaction(s): Other (See Comments)  . Montelukast Sodium Other (See Comments)  . Mupirocin Other (See Comments)  . Naproxen Other (See Comments) and Rash  . Naproxen Sodium Rash    mild rash  . Oxycodone Hcl Rash  . Sulfa Antibiotics Hives and Rash    Metabolic Disorder Labs: Lab Results  Component Value Date   HGBA1C 4.9 01/29/2018   MPG 94 01/29/2018   MPG 97 10/10/2016   No results found for: PROLACTIN Lab Results  Component Value Date   CHOL 131 09/26/2018   TRIG 87 09/26/2018   HDL 45 (L) 09/26/2018   CHOLHDL 2.9 09/26/2018   VLDL 20 10/10/2016   LDLCALC 69 09/26/2018   LDLCALC 195 (H) 06/22/2018   Lab Results  Component Value Date    TSH 2.84 01/29/2018   TSH 1.649 03/02/2016    Therapeutic Level Labs: No results found for: LITHIUM No results found for: VALPROATE No components found for:  CBMZ  Current Medications: Current Outpatient Medications  Medication Sig Dispense Refill  . acetaminophen (TYLENOL) 500 MG tablet Take 1,000 mg by mouth every 8 (eight) hours as needed for headache.    . benzonatate (TESSALON) 100 MG capsule Take 1-2 capsules (100-200 mg total) by mouth 2 (two) times daily as needed. 40 capsule 0  . busPIRone (BUSPAR) 7.5 MG tablet Take 1 tablet (7.5 mg total) by mouth 2 (two) times  daily. 60 tablet 0  . dicyclomine (BENTYL) 20 MG tablet TAKE 1 TABLET BY MOUTH TWICE A DAY AS NEEDED FOR SPASMS 180 tablet 1  . diphenhydrAMINE (BENADRYL) 50 MG tablet Take 50 mg by mouth at bedtime as needed for itching.    . Elagolix Sodium (ORILISSA) 150 MG TABS Take 1 tablet by mouth daily. 30 tablet 0  . levonorgestrel-ethinyl estradiol (SEASONALE,INTROVALE,JOLESSA) 0.15-0.03 MG tablet Take 1 tablet by mouth daily. 1 Package 4  . rosuvastatin (CRESTOR) 20 MG tablet TAKE 1 TABLET BY MOUTH EVERY DAY 90 tablet 0  . sertraline (ZOLOFT) 100 MG tablet Take 1 tablet (100 mg total) by mouth daily. 90 tablet 0  . diltiazem (CARDIZEM CD) 120 MG 24 hr capsule Take 1 capsule (120 mg total) by mouth daily. 90 capsule 3  . metoprolol succinate (TOPROL-XL) 25 MG 24 hr tablet Take 1 tablet (25 mg total) by mouth daily. Take with or immediately following a meal. 90 tablet 3   Current Facility-Administered Medications  Medication Dose Route Frequency Provider Last Rate Last Dose  . cyanocobalamin ((VITAMIN B-12)) injection 1,000 mcg  1,000 mcg Intramuscular Once Alba Cory, MD         Musculoskeletal: Strength & Muscle Tone: within normal limits Gait & Station: normal Patient leans: N/A  Psychiatric Specialty Exam: Review of Systems  Psychiatric/Behavioral: The patient is nervous/anxious and has insomnia.   All other  systems reviewed and are negative.   Blood pressure 112/76, pulse 81, temperature 99 F (37.2 C), temperature source Oral, weight 148 lb 9.6 oz (67.4 kg), last menstrual period 09/22/2018.Body mass index is 26.32 kg/m.  General Appearance: Casual  Eye Contact:  Good  Speech:  Normal Rate  Volume:  Normal  Mood:  Anxious  Affect:  Congruent  Thought Process:  Goal Directed and Descriptions of Associations: Intact  Orientation:  Full (Time, Place, and Person)  Thought Content: logical, reports she sees somethin from the corner of her eyes and hear noises - but that is chronic, does not bother her   Suicidal Thoughts:  No  Homicidal Thoughts:  No  Memory:  Immediate;   Fair Recent;   Fair Remote;   Fair  Judgement:  Fair  Insight:  Fair  Psychomotor Activity:  Normal  Concentration:  Concentration: Fair and Attention Span: Fair  Recall:  [employer] of Knowledge: Fair  Language: Fair  Akathisia:  No  Handed:  Right  AIMS (if indicated): na  Assets:  Communication Skills Desire for Improvement Social Support  ADL's:  Intact  Cognition: WNL  Sleep:  restless at times   Screenings: GAD-7     Office Visit from 09/25/2018 in [facility] Office Visit from 06/22/2018 in [facility] Office Visit from 03/19/2018 in [facility] Office Visit from 01/29/2018 in [facility]  Total GAD-7 Score  14  7  12  7     PHQ2-9     Office Visit from 09/25/2018 in [facility] Office Visit from 06/22/2018 in [facility] Office Visit from 03/19/2018 in [facility] Office Visit from 01/29/2018 in [facility] Office Visit from 04/11/2017 in [facility]  PHQ-2 Total Score  3  2  2  2  1   PHQ-9 Total Score  17  12  8  9   -       Assessment and Plan: Judah is a 32 year old Caucasian female who is single,  unemployed, lives in [place],  has a history of depression, anxiety, multiple medical problems and is deaf and mute, presented to the clinic today for a follow-up visit.  Patient with psychosocial stressors of multiple health issues.  She also has a history of trauma.  Patient is having side effects to the medication that was just started.  We will continue to make medication readjustment.  She will continue psychotherapy sessions.  Plan as noted below.  Plan For MDD Continue Zoloft 100 mg p.o. daily. Add BuSpar 7.5 mg p.o. twice daily. Continue Abilify for side effects.  Rule out PTSD We will continue to monitor her closely.  For social anxiety disorder Add BuSpar 7.5 mg p.o. twice daily Continue Zoloft as prescribed  Patient will continue psychotherapy sessions with Ms. [name].  Follow-up in clinic in 3 weeks or sooner if needed  More than 50 % of the time was spent for psychoeducation and supportive psychotherapy and care coordination.  This note was generated in part or whole with voice recognition software. Voice recognition is usually quite accurate but there are transcription errors that can and very often do occur. I apologize for any typographical errors that were not detected and corrected.       Jomarie Longs, MD 09/28/2018, 10:31 AM

## 2018-09-28 ENCOUNTER — Encounter: Payer: Self-pay | Admitting: Psychiatry

## 2018-10-01 ENCOUNTER — Encounter: Payer: Self-pay | Admitting: Obstetrics and Gynecology

## 2018-10-03 ENCOUNTER — Encounter: Payer: Self-pay | Admitting: Licensed Clinical Social Worker

## 2018-10-03 ENCOUNTER — Ambulatory Visit (INDEPENDENT_AMBULATORY_CARE_PROVIDER_SITE_OTHER): Payer: Medicare Other | Admitting: Licensed Clinical Social Worker

## 2018-10-03 ENCOUNTER — Encounter: Payer: Self-pay | Admitting: *Deleted

## 2018-10-03 DIAGNOSIS — F331 Major depressive disorder, recurrent, moderate: Secondary | ICD-10-CM | POA: Diagnosis not present
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
## 2018-10-08 ENCOUNTER — Ambulatory Visit (INDEPENDENT_AMBULATORY_CARE_PROVIDER_SITE_OTHER): Payer: Medicare Other | Admitting: Obstetrics and Gynecology

## 2018-10-08 ENCOUNTER — Other Ambulatory Visit: Payer: Self-pay

## 2018-10-08 ENCOUNTER — Ambulatory Visit: Payer: Medicare Other | Attending: Obstetrics and Gynecology

## 2018-10-08 ENCOUNTER — Encounter: Payer: Self-pay | Admitting: Obstetrics and Gynecology

## 2018-10-08 ENCOUNTER — Telehealth: Payer: Self-pay | Admitting: Psychiatry

## 2018-10-08 VITALS — BP 104/66 | HR 76 | Ht 63.0 in | Wt 142.7 lb

## 2018-10-08 DIAGNOSIS — M629 Disorder of muscle, unspecified: Secondary | ICD-10-CM | POA: Diagnosis not present

## 2018-10-08 DIAGNOSIS — N946 Dysmenorrhea, unspecified: Secondary | ICD-10-CM | POA: Diagnosis not present

## 2018-10-08 DIAGNOSIS — R293 Abnormal posture: Secondary | ICD-10-CM

## 2018-10-08 DIAGNOSIS — R109 Unspecified abdominal pain: Secondary | ICD-10-CM

## 2018-10-08 DIAGNOSIS — M791 Myalgia, unspecified site: Secondary | ICD-10-CM | POA: Insufficient documentation

## 2018-10-08 DIAGNOSIS — M6289 Other specified disorders of muscle: Secondary | ICD-10-CM

## 2018-10-08 DIAGNOSIS — N923 Ovulation bleeding: Secondary | ICD-10-CM

## 2018-10-08 DIAGNOSIS — M62838 Other muscle spasm: Secondary | ICD-10-CM | POA: Diagnosis not present

## 2018-10-08 MED ORDER — NORGESTIMATE-ETH ESTRADIOL 0.25-35 MG-MCG PO TABS: 1.0000 | ORAL_TABLET | Freq: Every day | ORAL | 11 refills | Status: DC
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
## 2018-10-08 NOTE — Telephone Encounter (Signed)
Received call from patient as well as ASL interpreter. Pt having side effects to buspar. Advised to stop buspar. She was advised to stop Abilify last visit due to side effects. She has upcoming appointment on Thursday - will discuss alternative meds during her next visit.

## 2018-10-08 NOTE — Progress Notes (Signed)
Pt is present today for follow up. Pt stated that the medication is not helping. Pt stated that she is still having pain. No other complaints.

## 2018-10-08 NOTE — Progress Notes (Signed)
    GYNECOLOGY PROGRESS NOTE  Subjective:    Patient ID: Yesenia Wood, female    DOB: February 08, 1986, 32 y.o.   MRN: [id number]  HPI  Patient is a 32 y.o. G0P0000 female who presents for 1 month follow up of possible endometriosis symptoms (painful periods, intermenstrual spotting and heavy periods).  She has had a 1 month trial of using Orilissa and notes that she has not had very much change in her symptoms.  She is still having intermenstrual spotting and painful periods.  Denies any side effects from the medication.   The following portions of the patient's history were reviewed and updated as appropriate: allergies, current medications, past family history, past medical history, past social history, past surgical history and problem list.  Review of Systems A comprehensive review of systems was negative except for: Musculoskeletal: positive for patient still noting abdominal discomfort with heavy lifting since her surgery.  Laparoscopic surgery was over 1 month ago.   Objective:   Blood pressure 104/66, pulse 76, height 5\' 3"  (1.6 m), weight 142 lb 11.2 oz (64.7 kg), last menstrual period 09/29/2018. General appearance: alert and no distress Abdomen: soft, non-tender; bowel sounds normal; no masses,  no organomegaly. Well healed laparoscopic incisions.  Remainder of exam deferred.    Assessment:   Painful menses Intermenstrual spotting Abdominal discomfort  Plan:   - Discussed options of returning to her OCPs as they were initially helping her symptoms, but can increase the dosing (was previously on Seasonale prior to trial of Orilissa). Will change to Sprintec.  Patient declines use of an IUD due to h/o pain previously. Is unsure about Nexplanon due to continued risk of spotting/abnormal bleeding.  Can also consider use of Aygestin if OCP does not help.  - Abdominal discomfort with lifting since her surgery. Discussed that she may have scar tissue present after her previous surgery.  Advised to mention to her pelvic floor physical therapist as they can likely assist with helping to manage this continued post-operative discomfort. Patient can continue use of NSAIDs as needed.  - Advised to discontinue use of Dewayne Hatch, can begin OCPs today.  Follow up in 2-3 months to reassess symptoms.    Hildred Laser, MD [facility]

## 2018-10-11 ENCOUNTER — Encounter: Payer: Self-pay | Admitting: Family Medicine

## 2018-10-11 ENCOUNTER — Other Ambulatory Visit: Payer: Self-pay

## 2018-10-11 ENCOUNTER — Encounter: Payer: Self-pay | Admitting: Psychiatry

## 2018-10-11 ENCOUNTER — Ambulatory Visit (INDEPENDENT_AMBULATORY_CARE_PROVIDER_SITE_OTHER): Payer: Medicare Other | Admitting: Psychiatry

## 2018-10-11 ENCOUNTER — Ambulatory Visit (INDEPENDENT_AMBULATORY_CARE_PROVIDER_SITE_OTHER): Payer: Medicare Other | Admitting: Family Medicine

## 2018-10-11 ENCOUNTER — Ambulatory Visit: Payer: Self-pay

## 2018-10-11 VITALS — BP 108/60 | HR 85 | Temp 98.5°F | Resp 16 | Ht 63.0 in | Wt 149.5 lb

## 2018-10-11 VITALS — BP 115/76 | HR 73 | Temp 98.9°F | Wt 146.8 lb

## 2018-10-11 DIAGNOSIS — F331 Major depressive disorder, recurrent, moderate: Secondary | ICD-10-CM

## 2018-10-11 DIAGNOSIS — F5105 Insomnia due to other mental disorder: Secondary | ICD-10-CM | POA: Diagnosis not present

## 2018-10-11 DIAGNOSIS — T22011A Burn of unspecified degree of right forearm, initial encounter: Secondary | ICD-10-CM

## 2018-10-11 DIAGNOSIS — F4010 Social phobia, unspecified: Secondary | ICD-10-CM | POA: Diagnosis not present

## 2018-10-11 MED ORDER — TRAZODONE HCL 50 MG PO TABS: 25.0000 mg | ORAL_TABLET | Freq: Every day | ORAL | 1 refills | Status: DC

## 2018-10-11 MED ORDER — BACITRACIN-NEOMYCIN-POLYMYXIN OINTMENT TUBE: 1.0000 "application " | TOPICAL_OINTMENT | Freq: Two times a day (BID) | CUTANEOUS | 0 refills | Status: DC

## 2018-10-11 MED ORDER — SERTRALINE HCL 100 MG PO TABS: 150.0000 mg | ORAL_TABLET | Freq: Every day | ORAL | 0 refills | Status: DC
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
## 2018-10-11 NOTE — Progress Notes (Signed)
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 Yesenia Wood presented along with her ASL interpreter who assisted with evaluation today-Pat.  Yesenia Wood reports Yesenia Wood had to stop taking the BuSpar after discussion with writer a few days ago.  Yesenia Wood reports it gave her side effects.  Yesenia Wood continues to feel depressed.  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  Yesenia Wood however reports Yesenia Wood continues to feel depressed and it is difficult to explain her symptoms.  Yesenia Wood also struggles with sleep.  Yesenia Wood denies any suicidality.  Yesenia Wood denies any perceptual disturbances.  Discussed readjusting her medication dosage.  Yesenia Wood reports Yesenia Wood does not like adding medications and wants to be on the minimum.  Discussed increasing her Zoloft to a higher dosage.  Also discussed adding a sleep medication.  Yesenia Wood reports Yesenia Wood may have tried trazodone in the past and does not remember how Yesenia Wood tolerated it.  Yesenia Wood is willing to try it again.  Yesenia Wood will continue psychotherapy sessions with Ms. [name].  Yesenia Wood denies any other concerns today.  Visit Diagnosis:    ICD-10-CM   1. MDD (major depressive disorder), recurrent episode, moderate (HCC) F33.1   2. Social anxiety disorder F40.10   3. Insomnia due to mental condition F51.05     Past Psychiatric History: I have reviewed past psychiatric history from my progress note on 09/10/2018.  Past trials of Zoloft, Cymbalta, Pamelor, gabapentin, Abilify  Past Medical History:  Past Medical History:  Diagnosis Date  . ADHD (attention  deficit hyperactivity disorder)   . B12 deficiency   . Bell's palsy    right sided  . Chronic migraine   . Deaf   . Depression   . 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 Fatigue   . Fatty liver   . Fibromyalgia   . GERD (gastroesophageal reflux disease)   . Headache   . High triglycerides   . Hyperglycemia   . Hypertension   . Hyperthyroidism   . Moderate asthma   . Obesity   . Paresthesia   . Premature birth     Past Surgical History:  Procedure Laterality Date  . BARTHOLIN GLAND CYST REMOVAL    . COCHLEAR IMPLANT    . COCHLEAR IMPLANT REMOVAL    . LAPAROSCOPIC TUBAL LIGATION Bilateral 03/26/2018   Procedure: LAPAROSCOPIC TUBAL LIGATION;  Surgeon: Hildred Laser, MD;  Location: [facility] ORS;  Service: Gynecology;  Laterality: Bilateral;  with peritoneal biopsies  . LAPAROSCOPY    . MYRINGOTOMY WITH TUBE PLACEMENT    . TUBAL LIGATION  03/26/2018    Family Psychiatric History: I have reviewed family psychiatric history from my progress note on 09/10/2018  Family History:  Family History  Adopted: Yes  Family history unknown: Yes    Social History: I have reviewed social history from my progress note on 09/10/2018 Social History   Socioeconomic History  . Marital status: Single    Spouse name: Not on file  . Number of children: 0  . Years of  education: Not on file  . Highest education level: Some college, no degree  Occupational History  . Occupation: disability for hearing loss  Social Needs  . Financial resource strain: Somewhat hard  . Food insecurity:    Worry: Never true    Inability: Never true  . Transportation needs:    Medical: No    Non-medical: No  Tobacco Use  . Smoking status: Former Smoker    Packs/day: 1.00    Years: 2.00    Pack years: 2.00    Types: Cigarettes    Start date: 11/28/2004    Last attempt to quit: 11/28/2006    Years since quitting: 11.8  . Smokeless tobacco: Never Used   Substance and Sexual Activity  . Alcohol use: Not Currently    Alcohol/week: 0.0 standard drinks    Frequency: Never    Comment: occass  . Drug use: No  . Sexual activity: Not Currently    Birth control/protection: Pill    Comment: Seasonale  Lifestyle  . Physical activity:    Days per week: 7 days    Minutes per session: 150+ min  . Stress: To some extent  Relationships  . Social connections:    Talks on phone: Once a week    Gets together: Never    Attends religious service: Never    Active member of club or organization: No    Attends meetings of clubs or organizations: Never    Relationship status: Never married  Other Topics Concern  . Not on file  Social History Narrative   Lives with her adopted  parents   Hearing loss since birth     Allergies:  Allergies  Allergen Reactions  . Budesonide-Formoterol Fumarate Anaphylaxis and Other (See Comments)    Other reaction(s): Other (See Comments) chest pain Chest pain chest pain  . Astelin [Azelastine]     Tingling, numbness, nausea  . Gabapentin Nausea And Vomiting  . Nortriptyline Other (See Comments)    Other reaction(s): Other (See Comments)  . Diclofenac Other (See Comments)    Other reaction(s): Other (See Comments)  . Duloxetine Hcl Other (See Comments)    Other reaction(s): Other (See Comments)  . Montelukast Sodium Other (See Comments)  . Mupirocin Other (See Comments)  . Naproxen Other (See Comments) and Rash  . Naproxen Sodium Rash    mild rash  . Oxycodone Hcl Rash  . Sulfa Antibiotics Hives and Rash    Metabolic Disorder Labs: Lab Results  Component Value Date   HGBA1C 4.9 01/29/2018   MPG 94 01/29/2018   MPG 97 10/10/2016   No results found for: PROLACTIN Lab Results  Component Value Date   CHOL 131 09/26/2018   TRIG 87 09/26/2018   HDL 45 (L) 09/26/2018   CHOLHDL 2.9 09/26/2018   VLDL 20 10/10/2016   LDLCALC 69 09/26/2018   LDLCALC 195 (H) 06/22/2018   Lab Results  Component  Value Date   TSH 2.84 01/29/2018   TSH 1.649 03/02/2016    Therapeutic Level Labs: No results found for: LITHIUM No results found for: VALPROATE No components found for:  CBMZ  Current Medications: Current Outpatient Medications  Medication Sig Dispense Refill  . acetaminophen (TYLENOL) 500 MG tablet Take 1,000 mg by mouth every 8 (eight) hours as needed for headache.    . benzonatate (TESSALON) 100 MG capsule Take 1-2 capsules (100-200 mg total) by mouth 2 (two) times daily as needed. (Yesenia Wood not taking: Reported on 10/11/2018) 40 capsule 0  .  dicyclomine (BENTYL) 20 MG tablet TAKE 1 TABLET BY MOUTH TWICE A DAY AS NEEDED FOR SPASMS 180 tablet 1  . diphenhydrAMINE (BENADRYL) 50 MG tablet Take 50 mg by mouth at bedtime as needed for itching.    . Elagolix Sodium (ORILISSA) 150 MG TABS Take 1 tablet by mouth daily. 30 tablet 0  . norgestimate-ethinyl estradiol (ORTHO-CYCLEN,SPRINTEC,PREVIFEM) 0.25-35 MG-MCG tablet Take 1 tablet by mouth daily. 1 Package 11  . rosuvastatin (CRESTOR) 20 MG tablet TAKE 1 TABLET BY MOUTH EVERY DAY 90 tablet 0  . sertraline (ZOLOFT) 100 MG tablet Take 1.5 tablets (150 mg total) by mouth daily. Yesenia Wood has supplies 90 tablet 0  . diltiazem (CARDIZEM CD) 120 MG 24 hr capsule Take 1 capsule (120 mg total) by mouth daily. 90 capsule 3  . metoprolol succinate (TOPROL-XL) 25 MG 24 hr tablet Take 1 tablet (25 mg total) by mouth daily. Take with or immediately following a meal. 90 tablet 3  . neomycin-bacitracin-polymyxin (NEOSPORIN) OINT Apply 1 application topically 2 (two) times daily. 14 g 0  . traZODone (DESYREL) 50 MG tablet Take 0.5-1 tablets (25-50 mg total) by mouth at bedtime. For sleep 30 tablet 1   Current Facility-Administered Medications  Medication Dose Route Frequency Provider Last Rate Last Dose  . cyanocobalamin ((VITAMIN B-12)) injection 1,000 mcg  1,000 mcg Intramuscular Once Alba Cory, MD         Musculoskeletal: Strength & Muscle Tone:  within normal limits Gait & Station: normal Yesenia Wood leans: N/A  Psychiatric Specialty Exam: Review of Systems  Psychiatric/Behavioral: Positive for depression. The Yesenia Wood has insomnia.   All other systems reviewed and are negative.   Blood pressure 115/76, pulse 73, temperature 98.9 F (37.2 C), temperature source Oral, weight 146 lb 12.8 oz (66.6 kg), last menstrual period 09/29/2018.Body mass index is 26 kg/m.  General Appearance: Casual  Eye Contact:  Fair  Speech:  Normal Rate  Volume:  Normal  Mood:  Dysphoric  Affect:  Congruent  Thought Process:  Goal Directed and Descriptions of Associations: Intact  Orientation:  Full (Time, Place, and Person)  Thought Content: Logical   Suicidal Thoughts:  No  Homicidal Thoughts:  No  Memory:  Immediate;   Fair Recent;   Fair Remote;   Fair  Judgement:  Fair  Insight:  Fair  Psychomotor Activity:  Normal  Concentration:  Concentration: Fair and Attention Span: Fair  Recall:  [employer] of Knowledge: Fair  Language: Fair  Akathisia:  No  Handed:  Right  AIMS (if indicated): denies tremors,rigidity,stiffness  Assets:  Communication Skills Desire for Improvement Social Support  ADL's:  Intact  Cognition: WNL  Sleep:  Poor   Screenings: GAD-7     Office Visit from 09/25/2018 in [facility] Office Visit from 06/22/2018 in [facility] Office Visit from 03/19/2018 in [facility] Office Visit from 01/29/2018 in [facility]  Total GAD-7 Score  14  7  12  7     PHQ2-9     Office Visit from 09/25/2018 in [facility] Office Visit from 06/22/2018 in [facility] Office Visit from 03/19/2018 in [facility] Office Visit from 01/29/2018 in [facility] Office Visit from 04/11/2017 in [facility]  PHQ-2 Total Score  3  2  2  2  1   PHQ-9 Total Score  17  12  8  9   -  Assessment and Plan: Evetta is a 32 year old Caucasian female who is single, unemployed, lives in [place], has a history of depression, anxiety, multiple medical problems, is deaf and mute, presented to the clinic today for a follow-up visit.  Yesenia Wood with psychosocial stressors of multiple health issues.  Yesenia Wood also has a history of trauma. Yesenia Wood continues to struggle with depressive symptoms and sleep problems.  Discussed medication readjustment with Yesenia Wood.  Will continue psychotherapy sessions.  Plan For MDD Increase Zoloft to 150 mg p.o. daily Discontinue BuSpar.  For social anxiety disorder Zoloft as prescribed  For insomnia Add trazodone 25-50 mg p.o. nightly. Provided her a printed out version of sleep hygiene tips.  Yesenia Wood is interested in genesight testing and will provide her with the information for the same today.  Follow-up in clinic in 2 weeks or sooner if needed.  More than 50 % of the time was spent for psychoeducation and supportive psychotherapy and care coordination.  This note was generated in part or whole with voice recognition software. Voice recognition is usually quite accurate but there are transcription errors that can and very often do occur. I apologize for any typographical errors that were not detected and corrected.        Jomarie Longs, MD 10/11/2018, 5:53 PM
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
## 2018-10-11 NOTE — Patient Instructions (Signed)
Burn Care, Adult A burn is an injury to the skin or the tissues under the skin. There are three types of burns:  First degree. These burns may cause the skin to be red and a bit swollen.  Second degree. These burns are very painful and cause the skin to be very red. The skin may also leak fluid, look shiny, and start to have blisters.  Third degree. These burns cause permanent damage. They turn the skin white or black and make it look charred, dry, and leathery.  Taking care of your burn properly can help to prevent pain and infection. It can also help the burn to heal more quickly. How is this treated? Right after a burn:  Rinse or soak the burn under cool water. Do this for several minutes. Do not put ice on your burn. That can cause more damage.  Lightly cover the burn with a clean (sterile) cloth (dressing). Burn care  Raise (elevate) the injured area above the level of your heart while sitting or lying down.  Follow instructions from your doctor about: ? How to clean and take care of the burn. ? When to change and remove the cloth.  Check your burn every day for signs of infection. Check for: ? More redness, swelling, or pain. ? Warmth. ? Pus or a bad smell. Medicine   Take over-the-counter and prescription medicines only as told by your doctor.  If you were prescribed antibiotic medicine, take or apply it as told by your doctor. Do not stop using the antibiotic even if your condition improves. General instructions  To prevent infection: ? Do not put butter, oil, or other home treatments on the burn. ? Do not scratch or pick at the burn. ? Do not break any blisters. ? Do not peel skin.  Do not rub your burn, even when you are cleaning it.  Protect your burn from the sun. Contact a doctor if:  Your condition does not get better.  Your condition gets worse.  You have a fever.  Your burn looks different or starts to have black or red spots on it.  Your burn  feels warm to the touch.  Your pain is not controlled with medicine. Get help right away if:  You have redness, swelling, or pain at the site of the burn.  You have fluid, blood, or pus coming from your burn.  You have red streaks near the burn.  You have very bad pain. This information is not intended to replace advice given to you by your health care provider. Make sure you discuss any questions you have with your health care provider. Document Released: 08/23/2008 Document Revised: 12/31/2016 Document Reviewed: 05/03/2016 Elsevier Interactive Patient Education  2018 [employer]  

## 2018-10-11 NOTE — Patient Instructions (Signed)
Trazodone tablets What is this medicine? TRAZODONE (TRAZ oh done) is used to treat depression. This medicine may be used for other purposes; ask your health care provider or pharmacist if you have questions. COMMON BRAND NAME(S): Desyrel What should I tell my health care provider before I take this medicine? They need to know if you have any of these conditions: -attempted suicide or thinking about it -bipolar disorder -bleeding problems -glaucoma -heart disease, or previous heart attack -irregular heart beat -kidney or liver disease -low levels of sodium in the blood -an unusual or allergic reaction to trazodone, other medicines, foods, dyes or preservatives -pregnant or trying to get pregnant -breast-feeding How should I use this medicine? Take this medicine by mouth with a glass of water. Follow the directions on the prescription label. Take this medicine shortly after a meal or a light snack. Take your medicine at regular intervals. Do not take your medicine more often than directed. Do not stop taking this medicine suddenly except upon the advice of your doctor. Stopping this medicine too quickly may cause serious side effects or your condition may worsen. A special MedGuide will be given to you by the pharmacist with each prescription and refill. Be sure to read this information carefully each time. Talk to your pediatrician regarding the use of this medicine in children. Special care may be needed. Overdosage: If you think you have taken too much of this medicine contact a poison control center or emergency room at once. NOTE: This medicine is only for you. Do not share this medicine with others. What if I miss a dose? If you miss a dose, take it as soon as you can. If it is almost time for your next dose, take only that dose. Do not take double or extra doses. What may interact with this medicine? Do not take this medicine with any of the following medications: -certain medicines  for fungal infections like fluconazole, itraconazole, ketoconazole, posaconazole, voriconazole -cisapride -dofetilide -dronedarone -linezolid -MAOIs like Carbex, Eldepryl, Marplan, Nardil, and Parnate -mesoridazine -methylene blue (injected into a vein) -pimozide -saquinavir -thioridazine -ziprasidone This medicine may also interact with the following medications: -alcohol -antiviral medicines for HIV or AIDS -aspirin and aspirin-like medicines -barbiturates like phenobarbital -certain medicines for blood pressure, heart disease, irregular heart beat -certain medicines for depression, anxiety, or psychotic disturbances -certain medicines for migraine headache like almotriptan, eletriptan, frovatriptan, naratriptan, rizatriptan, sumatriptan, zolmitriptan -certain medicines for seizures like carbamazepine and phenytoin -certain medicines for sleep -certain medicines that treat or prevent blood clots like dalteparin, enoxaparin, warfarin -digoxin -fentanyl -lithium -NSAIDS, medicines for pain and inflammation, like ibuprofen or naproxen -other medicines that prolong the QT interval (cause an abnormal heart rhythm) -rasagiline -supplements like St. John's wort, kava kava, valerian -tramadol -tryptophan This list may not describe all possible interactions. Give your health care provider a list of all the medicines, herbs, non-prescription drugs, or dietary supplements you use. Also tell them if you smoke, drink alcohol, or use illegal drugs. Some items may interact with your medicine. What should I watch for while using this medicine? Tell your doctor if your symptoms do not get better or if they get worse. Visit your doctor or health care professional for regular checks on your progress. Because it may take several weeks to see the full effects of this medicine, it is important to continue your treatment as prescribed by your doctor. Patients and their families should watch out for new  or worsening thoughts of suicide or depression. Also   watch out for sudden changes in feelings such as feeling anxious, agitated, panicky, irritable, hostile, aggressive, impulsive, severely restless, overly excited and hyperactive, or not being able to sleep. If this happens, especially at the beginning of treatment or after a change in dose, call your health care professional. You may get drowsy or dizzy. Do not drive, use machinery, or do anything that needs mental alertness until you know how this medicine affects you. Do not stand or sit up quickly, especially if you are an older patient. This reduces the risk of dizzy or fainting spells. Alcohol may interfere with the effect of this medicine. Avoid alcoholic drinks. This medicine may cause dry eyes and blurred vision. If you wear contact lenses you may feel some discomfort. Lubricating drops may help. See your eye doctor if the problem does not go away or is severe. Your mouth may get dry. Chewing sugarless gum, sucking hard candy and drinking plenty of water may help. Contact your doctor if the problem does not go away or is severe. What side effects may I notice from receiving this medicine? Side effects that you should report to your doctor or health care professional as soon as possible: -allergic reactions like skin rash, itching or hives, swelling of the face, lips, or tongue -elevated mood, decreased need for sleep, racing thoughts, impulsive behavior -confusion -fast, irregular heartbeat -feeling faint or lightheaded, falls -feeling agitated, angry, or irritable -loss of balance or coordination -painful or prolonged erections -restlessness, pacing, inability to keep still -suicidal thoughts or other mood changes -tremors -trouble sleeping -seizures -unusual bleeding or bruising Side effects that usually do not require medical attention (report to your doctor or health care professional if they continue or are bothersome): -change in  sex drive or performance -change in appetite or weight -constipation -headache -muscle aches or pains -nausea This list may not describe all possible side effects. Call your doctor for medical advice about side effects. You may report side effects to FDA at 1-800-FDA-1088. Where should I keep my medicine? Keep out of the reach of children. Store at room temperature between 15 and 30 degrees C (59 to 86 degrees F). Protect from light. Keep container tightly closed. Throw away any unused medicine after the expiration date. NOTE: This sheet is a summary. It may not cover all possible information. If you have questions about this medicine, talk to your doctor, pharmacist, or health care provider.  2018 Elsevier/Gold Standard (2016-04-14 16:57:05)  

## 2018-10-16 ENCOUNTER — Ambulatory Visit: Payer: Medicare Other | Admitting: Licensed Clinical Social Worker

## 2018-10-19 ENCOUNTER — Other Ambulatory Visit: Payer: Self-pay | Admitting: Psychiatry

## 2018-10-19 DIAGNOSIS — F4010 Social phobia, unspecified: Secondary | ICD-10-CM

## 2018-10-19 DIAGNOSIS — F331 Major depressive disorder, recurrent, moderate: Secondary | ICD-10-CM

## 2018-10-22 ENCOUNTER — Ambulatory Visit: Payer: Medicare Other

## 2018-10-22 DIAGNOSIS — R293 Abnormal posture: Secondary | ICD-10-CM | POA: Diagnosis not present

## 2018-10-22 DIAGNOSIS — M629 Disorder of muscle, unspecified: Secondary | ICD-10-CM | POA: Diagnosis not present

## 2018-10-22 DIAGNOSIS — M791 Myalgia, unspecified site: Secondary | ICD-10-CM

## 2018-10-22 DIAGNOSIS — M6289 Other specified disorders of muscle: Secondary | ICD-10-CM

## 2018-10-22 DIAGNOSIS — M62838 Other muscle spasm: Secondary | ICD-10-CM | POA: Diagnosis not present

## 2018-10-22 NOTE — Therapy (Signed)
[place] MAIN [facility] SERVICES [address] [place], [place], [address] Phone: [phone]   Fax:  [phone]  Physical Therapy Treatment  Patient Details  Name: Yesenia Wood MRN: [id number] Date of Birth: 08/15/1986 Referring Provider (PT): Rubie Maid   Encounter Date: 10/22/2018  PT End of Session - 10/23/18 1127    Visit Number  17    Number of Visits  24    Date for PT Re-Evaluation  11/26/18    Authorization Type  Medicare    Authorization - Visit Number  2    Authorization - Number of Visits  5    PT Start Time  1505    PT Stop Time  1605    PT Time Calculation (min)  60 min    Activity Tolerance  Patient tolerated treatment well    Behavior During Therapy  [facility] for tasks assessed/performed       Past Medical History:  Diagnosis Date  . ADHD (attention deficit hyperactivity disorder)   . B12 deficiency   . Bell's palsy    right sided  . Chronic migraine   . Deaf   . Depression   . 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 Fatigue   . Fatty liver   . Fibromyalgia   . GERD (gastroesophageal reflux disease)   . Headache   . High triglycerides   . Hyperglycemia   . Hypertension   . Hyperthyroidism   . Moderate asthma   . Obesity   . Paresthesia   . Premature birth     Past Surgical History:  Procedure Laterality Date  . BARTHOLIN GLAND CYST REMOVAL    . COCHLEAR IMPLANT    . COCHLEAR IMPLANT REMOVAL    . LAPAROSCOPIC TUBAL LIGATION Bilateral 03/26/2018   Procedure: LAPAROSCOPIC TUBAL LIGATION;  Surgeon: Rubie Maid, MD;  Location: [facility] ORS;  Service: Gynecology;  Laterality: Bilateral;  with peritoneal biopsies  . LAPAROSCOPY    . MYRINGOTOMY WITH TUBE PLACEMENT    . TUBAL LIGATION  03/26/2018    There were no vitals filed for this visit.    Pelvic Floor Physical Therapy Treatment Note  SCREENING  Changes in medications, allergies, or medical  history?: no    SUBJECTIVE  Patient reports: She was recently sick last week, is sore at the back of her neck, was throwing up a lot.   Precautions:  deaf  Pain update:  Location of pain: back of her neck, middle of her back and her elbows. Current pain:  5/10  Max pain:  5/10 Least pain:  2/10 Nature of pain: sharp/achy  Patient Goals: Be able to walk comfortably and do activities around the house without pain.   OBJECTIVE  Changes in: Posture/Observations:  Mild hyperkyphosis  Range of Motion/Flexibilty:  Decreased mobility and pain through entire thoracic spine. ~ 50% reduction at T-L junction  And ~ T7, with greater relief through T8-11.  Palpation: Spasms through multifidus and Paraspinals surrounding the T-L junction radiating inferiolaterally to R>L.   INTERVENTIONS THIS SESSION: Manual: Performed TP release to multifidus and Paraspinals surrounding the T-L junction and PA obs through ~T6-T12 to reduce pain, improve mobility, and allow for improved posture and restoration of normal neural flow for decreased pain. Therex: educated on and practiced thoracic extension over towel roll to increase mobility and decrease pain at home.  Total time: 60 min.  PT Education - 10/23/18 1126    Education provided  Yes    Education Details  See Pt. Instructions and Interventions this session.    Person(s) Educated  Patient    Methods  Explanation;Verbal cues;Handout;Tactile cues    Comprehension  Verbalized understanding;Returned demonstration;Verbal cues required;Tactile cues required       PT Short Term Goals - 09/17/18 0802      PT SHORT TERM GOAL #1   Title  Patient will demonstrate improved pelvic allignment in standing and supine for improved pelvic position for PFM length/tension relationship.    Time  5    Period  Weeks    Status  Achieved    Target Date  01/15/18      PT SHORT TERM GOAL #2   Title  Patient  will undergo Internal/External assessment of the PFM to allow or direct treatment of PFM spasms.    Baseline  Pt. is avoidant of all internal and external pelvic exam    Time  5    Period  Weeks    Status  On-going    Target Date  01/15/18      PT SHORT TERM GOAL #3   Title  Patient will demonstrate appropriate body mechanics with bending and lifting heavy objects to allow for decreased stress on the pelvic floor and low back.     Time  5    Period  Weeks    Status  Achieved    Target Date  01/15/18      PT SHORT TERM GOAL #4   Title  Patient will perform self external TP release to decrease spasm and sensitivity of nerves to allow for ecreased pain.    Baseline  Pt. has reported trying this at home but seems to still be avoidant of touch.    Time  5    Period  Weeks    Status  Partially Met    Target Date  07/31/18      PT SHORT TERM GOAL #5   Title  Patient will describe Max pain of no greater than 3/10 over prior week to demonstrate symptom improvement and improved quality of life.    Time  5    Period  Weeks    Status  On-going    Target Date  11/26/18        PT Long Term Goals - 09/17/18 0800      PT LONG TERM GOAL #1   Title  Patient will score at or below 8/43 on the Female NIH-CPSI and 5 on the VQ to demonstrate a clinically meaningful decrease in disability and distress due to pelvic floor dysfunction.    Baseline  NIH- CPSI: 18/43, VQ: 10/33     Time  10    Period  Weeks    Status  On-going      PT LONG TERM GOAL #2   Title  Patient will demonstrate ability to use a tool for self TP release to allow for maintenence of spasm release upon D/C.    Time  10    Period  Weeks    Status  On-going    Target Date  11/26/18      PT LONG TERM GOAL #3   Title  Patient will report a reduction in pain to no greater than 2/10 over the prior week to demonstrate symptom improvement.    Time  10    Period  Weeks    Status  On-going  Target Date  11/26/18      PT LONG  TERM GOAL #4   Title  Patient will demonstrate improved sitting and standing posture to demonstrate learning and decrease stress on the pelvic floor with functional activity.    Time  10    Period  Weeks    Status  Achieved      PT LONG TERM GOAL #5   Title  Patient will perform regular exercise over the prior 2 weeks to demonstrate improved QOL and ability to maintain symptom improvement upon D/C    Time  10    Period  Weeks    Status  Achieved    Target Date  07/31/18            Plan - 10/23/18 1128    Clinical Impression Statement  Pt. responded slowly but well to all interventions today, demonstrating decreased pain at lower thoracic regions with some increased irritation at upper thoracic region due to insufficient time to fully aleviate pain. She demonstrated understanding on new exercise to help self-treat decreased thoracic mobility at home for further reduction. Continue per POC.    Clinical Presentation  Stable    Clinical Decision Making  High    Rehab Potential  Fair    Clinical Impairments Affecting Rehab Potential  Deaf, multiple co-morbidities, fibromyalgia, fear of direct pelvic exam and treatment.    PT Frequency  Other (comment)   every-other-week   PT Duration  Other (comment)   10 weeks   PT Treatment/Interventions  ADLs/Self Care Home Management;Aquatic Therapy;Biofeedback;Moist Heat;Traction;Ultrasound;Balance training;Gait training;Functional mobility training;Therapeutic activities;Therapeutic exercise;Neuromuscular re-education;Patient/family education;Orthotic Fit/Training;Scar mobilization;Manual techniques;Manual lymph drainage;Dry needling;Taping    PT Next Visit Plan  Ask about home thoracic extension over towel success, add pelvic tilts and squats, educate on scar massage, re-assess spine restrictions in cervical and at T-L junction    PT Home Exercise Plan  diaphragmatic breathing, TA in quadruped, child's pose, adductor stretch, piriformis stretch,  squatty potty, L side stretch, hip abduction, tall posture walking.  Bird-dog, multi-stepping, mini-marches, prone press-ups, TP release with lacrosse ball., Heels squeeze in Prone, thoracic extensions over towel roll.     Consulted and Agree with Plan of Care  Patient       Patient will benefit from skilled therapeutic intervention in order to improve the following deficits and impairments:  Dizziness, Increased fascial restricitons, Impaired sensation, Improper body mechanics, Pain, Decreased coordination, Hypermobility, Postural dysfunction, Increased muscle spasms, Impaired tone, Decreased balance, Difficulty walking  Visit Diagnosis: Myalgia  Other muscle spasm  Abnormal posture  Muscular imbalance     Problem List Patient Active Problem List   Diagnosis Date Noted  . 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 OSA (obstructive sleep apnea) 10/10/2016  . Other insomnia 10/10/2016  . 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 Sleep disturbance 05/10/2016  . Chronic migraine without aura 03/29/2016  . Costochondral chest pain 12/28/2015  . Pain in the chest 06/09/2015  . Palpitations 06/09/2015  . Deaf 05/16/2015  . B12 deficiency 05/16/2015  . Cochlear implant status 05/16/2015  . Fatty infiltration of liver 05/16/2015  . Fibromyalgia syndrome 05/16/2015  . Gastro-esophageal reflux disease without esophagitis 05/16/2015  . Generalized headache 05/16/2015  . Right-sided Bell's palsy 05/16/2015  . H/O: depression 05/16/2015  . Hypertriglyceridemia 05/16/2015  . Blood glucose elevated 05/16/2015  . Irregular menstrual cycle 05/16/2015  . Overweight 05/16/2015  . Paresthesia 05/16/2015  . Disorder of labyrinth 05/16/2015  . Dyspnea 10/07/2014  . Moderate persistent intrinsic asthma without status  asthmaticus without complication 05/39/7673   Willa Rough DPT, ATC Willa Rough 10/23/2018, 11:37 AM  [place] MAIN [facility] SERVICES [place], [place], [address] Phone: [phone]   Fax:  [phone]  Name: Yesenia Wood MRN: [id number] Date of Birth: 03-02-1986

## 2018-10-22 NOTE — Patient Instructions (Signed)
   Place foam roller or towel under your upper back between your shoulder blades. Support your head with your hands, elbows forward, and gently rock back and forth and side to side to improve motion in your back.   Do this in a small motion, using your exhale to extend, for ~ 3-5 min. Per day, you can do it in different spots through your mid/upper back.

## 2018-10-23 NOTE — Telephone Encounter (Signed)
1 

## 2018-10-24 ENCOUNTER — Encounter: Payer: Self-pay | Admitting: Psychiatry

## 2018-10-24 ENCOUNTER — Other Ambulatory Visit: Payer: Self-pay

## 2018-10-24 ENCOUNTER — Ambulatory Visit (INDEPENDENT_AMBULATORY_CARE_PROVIDER_SITE_OTHER): Payer: Medicare Other | Admitting: Psychiatry

## 2018-10-24 VITALS — BP 115/79 | HR 72 | Temp 98.8°F | Wt 149.6 lb

## 2018-10-24 DIAGNOSIS — F4010 Social phobia, unspecified: Secondary | ICD-10-CM

## 2018-10-24 DIAGNOSIS — G43719 Chronic migraine without aura, intractable, without status migrainosus: Secondary | ICD-10-CM | POA: Diagnosis not present

## 2018-10-24 DIAGNOSIS — G43109 Migraine with aura, not intractable, without status migrainosus: Secondary | ICD-10-CM | POA: Diagnosis not present

## 2018-10-24 DIAGNOSIS — M542 Cervicalgia: Secondary | ICD-10-CM | POA: Diagnosis not present

## 2018-10-24 DIAGNOSIS — F331 Major depressive disorder, recurrent, moderate: Secondary | ICD-10-CM | POA: Diagnosis not present

## 2018-10-24 DIAGNOSIS — F5105 Insomnia due to other mental disorder: Secondary | ICD-10-CM | POA: Diagnosis not present

## 2018-10-24 DIAGNOSIS — G43111 Migraine with aura, intractable, with status migrainosus: Secondary | ICD-10-CM | POA: Diagnosis not present

## 2018-10-24 DIAGNOSIS — G518 Other disorders of facial nerve: Secondary | ICD-10-CM | POA: Diagnosis not present

## 2018-10-24 DIAGNOSIS — M791 Myalgia, unspecified site: Secondary | ICD-10-CM | POA: Diagnosis not present

## 2018-10-24 MED ORDER — MIRTAZAPINE 7.5 MG PO TABS: 7.5000 mg | ORAL_TABLET | Freq: Every day | ORAL | 1 refills | Status: DC

## 2018-10-24 MED ORDER — SERTRALINE HCL 100 MG PO TABS: 150.0000 mg | ORAL_TABLET | Freq: Every day | ORAL | 0 refills | Status: DC

## 2018-10-24 NOTE — Progress Notes (Signed)
BH MD  OP Progress Note  10/24/2018 2:34 PM Yesenia Wood  MRN:  [id number]  Chief Complaint: ' I am here for follow up.' Chief Complaint    Follow-up; Medication Refill     HPI: Yesenia Wood is a 32 year old Caucasian female, unemployed, single, lives in [place], has a history of depression, anxiety, insomnia, migraine headaches, B12 deficiency, bilateral hearing loss, tachycardia, is mute, presented to the clinic today for a follow-up visit.  Patient presented along with her ASL interpreter who assisted with evaluation today-Pat.  Patient reports that she has been compliant with the Zoloft as prescribed.  She is on a higher dosage now.  She reports her mood symptoms as more stable with the higher dosage.  Patient however reports she feels tired and wonders whether it is due to the Zoloft.  Patient however reports she does not want to make more changes  and want to give it more time.  Patient reports she has been taking trazodone as prescribed.  She is taking 50 mg at bedtime.  She reports she gets up to 5 hours of sleep but that is interrupted.  She also reports some abdominal issues and wonders whether it is due to the trazodone or not.  Discussed with patient her trazodone can be changed to another sleep aid if she is having side effects and she agrees with plan.  We will start mirtazapine at bedtime.  Provided medication education.  Patient looks forward to Thanksgiving holidays.  She plans to spend it with family.  She denies any other concerns today.  Visit Diagnosis:    ICD-10-CM   1. MDD (major depressive disorder), recurrent episode, moderate (HCC) F33.1 sertraline (ZOLOFT) 100 MG tablet    mirtazapine (REMERON) 7.5 MG tablet  2. Social anxiety disorder F40.10 sertraline (ZOLOFT) 100 MG tablet  3. Insomnia due to mental condition F51.05 mirtazapine (REMERON) 7.5 MG tablet    Past Psychiatric History: I have reviewed past psychiatric history from my progress note on 09/10/2018.  Past  trials of Zoloft, Cymbalta, Pamelor, gabapentin, Abilify.  Past Medical History:  Past Medical History:  Diagnosis Date  . ADHD (attention deficit hyperactivity disorder)   . B12 deficiency   . Bell's palsy    right sided  . Chronic migraine   . Deaf   . Depression   . 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 Fatigue   . Fatty liver   . Fibromyalgia   . GERD (gastroesophageal reflux disease)   . Headache   . High triglycerides   . Hyperglycemia   . Hypertension   . Hyperthyroidism   . Moderate asthma   . Obesity   . Paresthesia   . Premature birth     Past Surgical History:  Procedure Laterality Date  . BARTHOLIN GLAND CYST REMOVAL    . COCHLEAR IMPLANT    . COCHLEAR IMPLANT REMOVAL    . LAPAROSCOPIC TUBAL LIGATION Bilateral 03/26/2018   Procedure: LAPAROSCOPIC TUBAL LIGATION;  Surgeon: Hildred Laserherry, Anika, MD;  Location: [facility] ORS;  Service: Gynecology;  Laterality: Bilateral;  with peritoneal biopsies  . LAPAROSCOPY    . MYRINGOTOMY WITH TUBE PLACEMENT    . TUBAL LIGATION  03/26/2018    Family Psychiatric History: Reviewed family psychiatric history from my progress note on 09/10/2018  Family History:  Family History  Adopted: Yes  Family history unknown: Yes    Social History: I have reviewed social history from my progress note on 09/10/2018  Social History   Socioeconomic History  . Marital status: Single    Spouse name: Not on file  . Number of children: 0  . Years of education: Not on file  . Highest education level: Some college, no degree  Occupational History  . Occupation: disability for hearing loss  Social Needs  . Financial resource strain: Somewhat hard  . Food insecurity:    Worry: Never true    Inability: Never true  . Transportation needs:    Medical: No    Non-medical: No  Tobacco Use  . Smoking status: Former Smoker    Packs/day: 1.00    Years: 2.00    Pack years: 2.00    Types:  Cigarettes    Start date: 11/28/2004    Last attempt to quit: 11/28/2006    Years since quitting: 11.9  . Smokeless tobacco: Never Used  Substance and Sexual Activity  . Alcohol use: Not Currently    Alcohol/week: 0.0 standard drinks    Frequency: Never    Comment: occass  . Drug use: No  . Sexual activity: Not Currently    Birth control/protection: Pill    Comment: Seasonale  Lifestyle  . Physical activity:    Days per week: 7 days    Minutes per session: 150+ min  . Stress: To some extent  Relationships  . Social connections:    Talks on phone: Once a week    Gets together: Never    Attends religious service: Never    Active member of club or organization: No    Attends meetings of clubs or organizations: Never    Relationship status: Never married  Other Topics Concern  . Not on file  Social History Narrative   Lives with her adopted  parents   Hearing loss since birth     Allergies:  Allergies  Allergen Reactions  . Budesonide-Formoterol Fumarate Anaphylaxis and Other (See Comments)    Other reaction(s): Other (See Comments) chest pain Chest pain chest pain  . Astelin [Azelastine]     Tingling, numbness, nausea  . Gabapentin Nausea And Vomiting  . Nortriptyline Other (See Comments)    Other reaction(s): Other (See Comments)  . Diclofenac Other (See Comments)    Other reaction(s): Other (See Comments)  . Duloxetine Hcl Other (See Comments)    Other reaction(s): Other (See Comments)  . Montelukast Sodium Other (See Comments)  . Mupirocin Other (See Comments)  . Naproxen Other (See Comments) and Rash  . Naproxen Sodium Rash    mild rash  . Oxycodone Hcl Rash  . Sulfa Antibiotics Hives and Rash    Metabolic Disorder Labs: Lab Results  Component Value Date   HGBA1C 4.9 01/29/2018   MPG 94 01/29/2018   MPG 97 10/10/2016   No results found for: PROLACTIN Lab Results  Component Value Date   CHOL 131 09/26/2018   TRIG 87 09/26/2018   HDL 45 (L)  09/26/2018   CHOLHDL 2.9 09/26/2018   VLDL 20 10/10/2016   LDLCALC 69 09/26/2018   LDLCALC 195 (H) 06/22/2018   Lab Results  Component Value Date   TSH 2.84 01/29/2018   TSH 1.649 03/02/2016    Therapeutic Level Labs: No results found for: LITHIUM No results found for: VALPROATE No components found for:  CBMZ  Current Medications: Current Outpatient Medications  Medication Sig Dispense Refill  . acetaminophen (TYLENOL) 500 MG tablet Take 1,000 mg by mouth every 8 (eight) hours as needed for headache.    . benzonatate (  TESSALON) 100 MG capsule Take 1-2 capsules (100-200 mg total) by mouth 2 (two) times daily as needed. 40 capsule 0  . dicyclomine (BENTYL) 20 MG tablet TAKE 1 TABLET BY MOUTH TWICE A DAY AS NEEDED FOR SPASMS 180 tablet 1  . diphenhydrAMINE (BENADRYL) 50 MG tablet Take 50 mg by mouth at bedtime as needed for itching.    . Elagolix Sodium (ORILISSA) 150 MG TABS Take 1 tablet by mouth daily. 30 tablet 0  . mirtazapine (REMERON) 7.5 MG tablet Take 1 tablet (7.5 mg total) by mouth at bedtime. For sleep 30 tablet 1  . neomycin-bacitracin-polymyxin (NEOSPORIN) OINT Apply 1 application topically 2 (two) times daily. 14 g 0  . norgestimate-ethinyl estradiol (ORTHO-CYCLEN,SPRINTEC,PREVIFEM) 0.25-35 MG-MCG tablet Take 1 tablet by mouth daily. 1 Package 11  . rosuvastatin (CRESTOR) 20 MG tablet TAKE 1 TABLET BY MOUTH EVERY DAY 90 tablet 0  . sertraline (ZOLOFT) 100 MG tablet Take 1.5 tablets (150 mg total) by mouth daily. 135 tablet 0  . diltiazem (CARDIZEM CD) 120 MG 24 hr capsule Take 1 capsule (120 mg total) by mouth daily. 90 capsule 3  . metoprolol succinate (TOPROL-XL) 25 MG 24 hr tablet Take 1 tablet (25 mg total) by mouth daily. Take with or immediately following a meal. 90 tablet 3   Current Facility-Administered Medications  Medication Dose Route Frequency Provider Last Rate Last Dose  . cyanocobalamin ((VITAMIN B-12)) injection 1,000 mcg  1,000 mcg Intramuscular Once  Alba Cory, MD         Musculoskeletal: Strength & Muscle Tone: within normal limits Gait & Station: normal Patient leans: N/A  Psychiatric Specialty Exam: Review of Systems  Constitutional: Positive for malaise/fatigue.  Psychiatric/Behavioral: The patient is nervous/anxious and has insomnia.   All other systems reviewed and are negative.   Blood pressure 115/79, pulse 72, temperature 98.8 F (37.1 C), temperature source Oral, weight 149 lb 9.6 oz (67.9 kg), last menstrual period 09/29/2018.Body mass index is 26.5 kg/m.  General Appearance: Casual  Eye Contact:  Fair  Speech:  uses ASL - normal rate  Volume:  uses ASL  Mood:  Anxious  Affect:  Congruent  Thought Process:  Goal Directed and Descriptions of Associations: Intact  Orientation:  Full (Time, Place, and Person)  Thought Content: Logical   Suicidal Thoughts:  No  Homicidal Thoughts:  No  Memory:  Immediate;   Fair Recent;   Fair Remote;   Fair  Judgement:  Fair  Insight:  Fair  Psychomotor Activity:  Normal  Concentration:  Concentration: Fair and Attention Span: Fair  Recall:  [employer] of Knowledge: Fair  Language: Fair  Akathisia:  No  Handed:  Right  AIMS (if indicated): na  Assets:  Communication Skills Desire for Improvement Social Support  ADL's:  Intact  Cognition: WNL  Sleep:  Poor   Screenings: GAD-7     Office Visit from 09/25/2018 in [facility] Office Visit from 06/22/2018 in [facility] Office Visit from 03/19/2018 in [facility] Office Visit from 01/29/2018 in [facility]  Total GAD-7 Score  14  7  12  7     PHQ2-9     Office Visit from 09/25/2018 in [facility] Office Visit from 06/22/2018 in [facility] Office Visit from 03/19/2018 in [facility] Office Visit from 01/29/2018 in [facility] Office Visit from 04/11/2017 in [facility]  PHQ-2 Total Score  3  2  2  2  1   PHQ-9 Total Score  17  12  8  9   -       Assessment and Plan: Mayana is a 32 year old Caucasian female who is single, unemployed, lives in [place], has a history of depression, anxiety, multiple medical problems, is deaf and mute, presented to the clinic today for a follow-up visit.  Patient with psychosocial stressors of multiple health issues.  She also has a history of trauma.  Patient continues to struggle with sleep issues and has adverse effects to the trazodone.  Hence we will make the following medication changes.  Plan For MDD Zoloft 150 mg p.o. daily  For social anxiety disorder Zoloft as prescribed  For insomnia Discontinue trazodone for side effects. Start Remeron 7.5 mg p.o. nightly  Patient to continue psychotherapy sessions.  Follow-up in clinic in 4 weeks or sooner if needed.  More than 50 % of the time was spent for psychoeducation and supportive psychotherapy and care coordination.  This note was generated in part or whole with voice recognition software. Voice recognition is usually quite accurate but there are transcription errors that can and very often do occur. I apologize for any typographical errors that were not detected and corrected.       Jomarie Longs, MD 10/24/2018, 2:34 PM

## 2018-10-24 NOTE — Patient Instructions (Signed)
Mirtazapine tablets What is this medicine? MIRTAZAPINE (mir TAZ a peen) is used to treat depression. This medicine may be used for other purposes; ask your health care provider or pharmacist if you have questions. COMMON BRAND NAME(S): Remeron What should I tell my health care provider before I take this medicine? They need to know if you have any of these conditions: -bipolar disorder -glaucoma -kidney disease -liver disease -suicidal thoughts -an unusual or allergic reaction to mirtazapine, other medicines, foods, dyes, or preservatives -pregnant or trying to get pregnant -breast-feeding How should I use this medicine? Take this medicine by mouth with a glass of water. Follow the directions on the prescription label. Take your medicine at regular intervals. Do not take your medicine more often than directed. Do not stop taking this medicine suddenly except upon the advice of your doctor. Stopping this medicine too quickly may cause serious side effects or your condition may worsen. A special MedGuide will be given to you by the pharmacist with each prescription and refill. Be sure to read this information carefully each time. Talk to your pediatrician regarding the use of this medicine in children. Special care may be needed. Overdosage: If you think you have taken too much of this medicine contact a poison control center or emergency room at once. NOTE: This medicine is only for you. Do not share this medicine with others. What if I miss a dose? If you miss a dose, take it as soon as you can. If it is almost time for your next dose, take only that dose. Do not take double or extra doses. What may interact with this medicine? Do not take this medicine with any of the following medications: -linezolid -MAOIs like Carbex, Eldepryl, Marplan, Nardil, and Parnate -methylene blue (injected into a vein) This medicine may also interact with the following medications: -alcohol -antiviral  medicines for HIV or AIDS -certain medicines that treat or prevent blood clots like warfarin -certain medicines for depression, anxiety, or psychotic disturbances -certain medicines for fungal infections like ketoconazole and itraconazole -certain medicines for migraine headache like almotriptan, eletriptan, frovatriptan, naratriptan, rizatriptan, sumatriptan, zolmitriptan -certain medicines for seizures like carbamazepine or phenytoin -certain medicines for sleep -cimetidine -erythromycin -fentanyl -lithium -medicines for blood pressure -nefazodone -rasagiline -rifampin -supplements like St. John's wort, kava kava, valerian -tramadol -tryptophan This list may not describe all possible interactions. Give your health care provider a list of all the medicines, herbs, non-prescription drugs, or dietary supplements you use. Also tell them if you smoke, drink alcohol, or use illegal drugs. Some items may interact with your medicine. What should I watch for while using this medicine? Tell your doctor if your symptoms do not get better or if they get worse. Visit your doctor or health care professional for regular checks on your progress. Because it may take several weeks to see the full effects of this medicine, it is important to continue your treatment as prescribed by your doctor. Patients and their families should watch out for new or worsening thoughts of suicide or depression. Also watch out for sudden changes in feelings such as feeling anxious, agitated, panicky, irritable, hostile, aggressive, impulsive, severely restless, overly excited and hyperactive, or not being able to sleep. If this happens, especially at the beginning of treatment or after a change in dose, call your health care professional. You may get drowsy or dizzy. Do not drive, use machinery, or do anything that needs mental alertness until you know how this medicine affects you. Do not   stand or sit up quickly, especially if  you are an older patient. This reduces the risk of dizzy or fainting spells. Alcohol may interfere with the effect of this medicine. Avoid alcoholic drinks. This medicine may cause dry eyes and blurred vision. If you wear contact lenses you may feel some discomfort. Lubricating drops may help. See your eye doctor if the problem does not go away or is severe. Your mouth may get dry. Chewing sugarless gum or sucking hard candy, and drinking plenty of water may help. Contact your doctor if the problem does not go away or is severe. What side effects may I notice from receiving this medicine? Side effects that you should report to your doctor or health care professional as soon as possible: -allergic reactions like skin rash, itching or hives, swelling of the face, lips, or tongue -anxious -changes in vision -chest pain -confusion -elevated mood, decreased need for sleep, racing thoughts, impulsive behavior -eye pain -fast, irregular heartbeat -feeling faint or lightheaded, falls -feeling agitated, angry, or irritable -fever or chills, sore throat -hallucination, loss of contact with reality -loss of balance or coordination -mouth sores -redness, blistering, peeling or loosening of the skin, including inside the mouth -restlessness, pacing, inability to keep still -seizures -stiff muscles -suicidal thoughts or other mood changes -trouble passing urine or change in the amount of urine -trouble sleeping -unusual bleeding or bruising -unusually weak or tired -vomiting Side effects that usually do not require medical attention (report to your doctor or health care professional if they continue or are bothersome): -change in appetite -constipation -dizziness -dry mouth -muscle aches or pains -nausea -tired -weight gain This list may not describe all possible side effects. Call your doctor for medical advice about side effects. You may report side effects to FDA at 1-800-FDA-1088. Where  should I keep my medicine? Keep out of the reach of children. Store at room temperature between 15 and 30 degrees C (59 and 86 degrees F) Protect from light and moisture. Throw away any unused medicine after the expiration date. NOTE: This sheet is a summary. It may not cover all possible information. If you have questions about this medicine, talk to your doctor, pharmacist, or health care provider.  2018 Elsevier/Gold Standard (2016-04-14 17:30:45)  

## 2018-11-05 ENCOUNTER — Ambulatory Visit: Payer: Medicare Other | Attending: Obstetrics and Gynecology

## 2018-11-05 DIAGNOSIS — M62838 Other muscle spasm: Secondary | ICD-10-CM

## 2018-11-05 DIAGNOSIS — R293 Abnormal posture: Secondary | ICD-10-CM

## 2018-11-05 DIAGNOSIS — M6289 Other specified disorders of muscle: Secondary | ICD-10-CM

## 2018-11-05 DIAGNOSIS — M629 Disorder of muscle, unspecified: Secondary | ICD-10-CM | POA: Insufficient documentation

## 2018-11-05 DIAGNOSIS — M791 Myalgia, unspecified site: Secondary | ICD-10-CM | POA: Diagnosis not present

## 2018-11-05 NOTE — Therapy (Signed)
[place] MAIN [facility] SERVICES [address] [place], [place], [address] Phone: [phone]   Fax:  [phone]  Physical Therapy Treatment  Patient Details  Name: Yesenia Wood MRN: [id number] Date of Birth: 17-Nov-1986 Referring Provider (PT): Rubie Maid   Encounter Date: 11/05/2018  PT End of Session - 11/05/18 1157    Visit Number  18    Number of Visits  24    Date for PT Re-Evaluation  11/26/18    Authorization Type  Medicare    Authorization - Visit Number  3    Authorization - Number of Visits  5    PT Start Time  4008    PT Stop Time  1135    PT Time Calculation (min)  60 min    Activity Tolerance  Patient tolerated treatment well    Behavior During Therapy  [facility] for tasks assessed/performed       Past Medical History:  Diagnosis Date  . ADHD (attention deficit hyperactivity disorder)   . B12 deficiency   . Bell's palsy    right sided  . Chronic migraine   . Deaf   . Depression   . 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 Fatigue   . Fatty liver   . Fibromyalgia   . GERD (gastroesophageal reflux disease)   . Headache   . High triglycerides   . Hyperglycemia   . Hypertension   . Hyperthyroidism   . Moderate asthma   . Obesity   . Paresthesia   . Premature birth     Past Surgical History:  Procedure Laterality Date  . BARTHOLIN GLAND CYST REMOVAL    . COCHLEAR IMPLANT    . COCHLEAR IMPLANT REMOVAL    . LAPAROSCOPIC TUBAL LIGATION Bilateral 03/26/2018   Procedure: LAPAROSCOPIC TUBAL LIGATION;  Surgeon: Rubie Maid, MD;  Location: [facility] ORS;  Service: Gynecology;  Laterality: Bilateral;  with peritoneal biopsies  . LAPAROSCOPY    . MYRINGOTOMY WITH TUBE PLACEMENT    . TUBAL LIGATION  03/26/2018    There were no vitals filed for this visit.    Pelvic Floor Physical Therapy Treatment Note  SCREENING  Changes in medications, allergies, or medical  history?: none   SUBJECTIVE  Patient reports: Has not noticed much change, has pain mostly when doing a lot of walking or bending and lifting. She is still having pain when she does her thoracic extensions, has been doing trigger point release externally and notices some decrease in pain but it has not gone away.  Precautions:  Deaf, fibromyalgia  Pain update:  Location of pain: mid back and B anterior hips Current pain:  3/10  Max pain:  5/10 Least pain:  2/10 Nature of pain: achy/sharp  Patient Goals: Be able to walk comfortably and do activities around the house without pain.   OBJECTIVE  Changes in: Posture/Observations:  Mild R anterior rotation  Strength/MMT:  Pt. Has difficulty stabilizing more with eccentric motion around z-axis and requires min. Cuing to engage TA as well as focused effort.   Palpation: TTP through R>L Iliacus, Pectineus and Psoas.  Gait Analysis: Pt. Is no longer dragging her feet, using improved posture but some anterior pelvic tilt still.  INTERVENTIONS THIS SESSION: Therex: Educated on and practiced half-kneeling self MET correction with band to engage deep-core stability and allow for reciprocal inhibition of overactive musculature to decrease spasm ad pain. Reviewed current HEP as needed for  clarity and proper performance. Self-care: educated on potential treatment options and obstacles to her success as well as how to perform self internal TP release with a tool at home to help break the pain/spasm cycle and decrease overall neural sensitivity. Reviewed best sleeping positions and how to use good body mechanics to prevent pain with bending and lifting.  Total time: 60 min.                         PT Education - 11/05/18 1156    Education provided  Yes    Education Details  See pt. Instructions and Interventions this session.    Person(s) Educated  Patient    Methods  Explanation;Demonstration;Tactile cues;Verbal  cues;Handout    Comprehension  Verbalized understanding;Returned demonstration;Verbal cues required;Tactile cues required;Need further instruction       PT Short Term Goals - 11/05/18 1058      PT SHORT TERM GOAL #1   Title  Patient will demonstrate improved pelvic allignment in standing and supine for improved pelvic position for PFM length/tension relationship.    Time  5    Period  Weeks    Status  Achieved    Target Date  01/15/18      PT SHORT TERM GOAL #2   Title  Patient will undergo Internal/External assessment of the PFM to allow or direct treatment of PFM spasms.    Baseline  Pt. is avoidant of all internal and external pelvic exam    Time  5    Period  Weeks    Status  On-going    Target Date  01/15/18      PT SHORT TERM GOAL #3   Title  Patient will demonstrate appropriate body mechanics with bending and lifting heavy objects to allow for decreased stress on the pelvic floor and low back.     Time  5    Period  Weeks    Status  Achieved      PT SHORT TERM GOAL #4   Title  Patient will perform self external TP release to decrease spasm and sensitivity of nerves to allow for ecreased pain.    Baseline  Pt. has reported trying this at home but seems to still be avoidant of touch.    Time  5    Period  Weeks    Status  Achieved    Target Date  07/31/18      PT SHORT TERM GOAL #5   Title  Patient will describe Max pain of no greater than 3/10 over prior week to demonstrate symptom improvement and improved quality of life.    Baseline  As on 12/9: max pain of 5/10    Time  5    Period  Weeks    Status  On-going        PT Long Term Goals - 11/05/18 1100      PT LONG TERM GOAL #1   Title  Patient will score at or below 8/43 on the Female NIH-CPSI and 5 on the VQ to demonstrate a clinically meaningful decrease in disability and distress due to pelvic floor dysfunction.    Baseline  NIH- CPSI: 18/43, VQ: 10/33 As of 12/9: NIH- CPSI: 12/43, VQ: 15/33    Time  10     Period  Weeks    Status  On-going    Target Date  01/28/19      PT LONG TERM GOAL #2   Title  Patient will demonstrate ability to use a tool for self TP release to allow for maintenence of spasm release upon D/C.    Time  10    Period  Weeks    Status  On-going    Target Date  01/28/19      PT LONG TERM GOAL #3   Title  Patient will report a reduction in pain to no greater than 2/10 over the prior week to demonstrate symptom improvement.    Baseline  As of 12/9: Max of 5/10 over past 2 weeks    Time  10    Period  Weeks    Status  On-going    Target Date  01/28/19      PT LONG TERM GOAL #4   Title  Patient will demonstrate improved sitting and standing posture to demonstrate learning and decrease stress on the pelvic floor with functional activity.    Baseline  Pt. demonstrates improved posture ~ 85% of the time.    Time  10    Period  Weeks    Status  Achieved    Target Date  07/31/18      PT LONG TERM GOAL #5   Title  Patient will perform regular exercise over the prior 2 weeks to demonstrate improved QOL and ability to maintain symptom improvement upon D/C    Time  10    Period  Weeks    Status  Achieved    Target Date  07/31/18            Plan - 11/05/18 1208    Clinical Impression Statement  Pt. has made significant progress in her posture and consistency with her HEP but continues to have pain ranging from 3-5/10 over the course of the week which is down from a maximum of 7-8/10 but has not significantly changed over the last 6 weeks regardless of improvement in body echanics and strength. We discussed the limitations in her treatment due to her discomfort with having an internal exam or treatment performed and not tolerating dry needling well. Aquatic therapy was suggested as an option to improve deep core strengthening without aggrivating fibromyalgia or overloading the spine but Pt. was not interested. We also discussed that a higher frequency could help [place] build  upon improvements more steadily but this is not possible for the Pt. at this time due to lack of transportation. The patient has agreed, however to buy a tool that would allow her to perform self internal TP release at home to see if we can break the parrern of spasms in the pelvis if we can begin to break the chronic pain cycle and prevent return of other spasms and pain as well. Pt. was educated on the potential benefit of adding vaginal valium to assist her in her ability to perform the internal release and help break the pain cycle and referred to her MD to determine if this is an appropriate option for her at this time. We will continue at current frrequency of every-other-week for 5 more visits over 10 weeks following the current POC to follow-up on success with home internal TP release and help guide treatment as well as to continue to progress deep-core strengthening to allow for relaxation of internal PFM.      Clinical Presentation  Stable    Clinical Presentation due to:  Pt. has hit a plateu due to restriction of interventions based on comfort level but has agreed to try self internal release at home  to determine if we can make more progress in her pain with the addition of this angle and potential addition of medication.    Clinical Decision Making  High    Rehab Potential  Fair    Clinical Impairments Affecting Rehab Potential  Deaf, multiple co-morbidities, fibromyalgia, fear of direct pelvic exam and treatment.    PT Frequency  Other (comment)    PT Duration  Other (comment)   10 weeks   PT Treatment/Interventions  ADLs/Self Care Home Management;Aquatic Therapy;Biofeedback;Moist Heat;Traction;Ultrasound;Balance training;Gait training;Functional mobility training;Therapeutic activities;Therapeutic exercise;Neuromuscular re-education;Patient/family education;Orthotic Fit/Training;Scar mobilization;Manual techniques;Manual lymph drainage;Dry needling;Taping    PT Next Visit Plan  Ask about  self-internal release success and answer questions. re-assess pelvic alignment and review new active MET exercise for performance, Increase strengthening exercises.    PT Home Exercise Plan  diaphragmatic breathing, TA in quadruped, child's pose, adductor stretch, piriformis stretch, squatty potty, L side stretch, hip abduction, tall posture walking.  Bird-dog, multi-stepping, mini-marches, prone press-ups, TP release with lacrosse ball., Heels squeeze in Prone, thoracic extensions over towel roll, self active MET in half-kneel.     Recommended Other Services  Vaginal Valium?    Consulted and Agree with Plan of Care  Patient       Patient will benefit from skilled therapeutic intervention in order to improve the following deficits and impairments:  Dizziness, Increased fascial restricitons, Impaired sensation, Improper body mechanics, Pain, Decreased coordination, Hypermobility, Postural dysfunction, Increased muscle spasms, Impaired tone, Decreased balance, Difficulty walking  Visit Diagnosis: Other muscle spasm  Abnormal posture  Muscular imbalance  Myalgia     Problem List Patient Active Problem List   Diagnosis Date Noted  . 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 OSA (obstructive sleep apnea) 10/10/2016  . Other insomnia 10/10/2016  . 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 Sleep disturbance 05/10/2016  . Chronic migraine without aura 03/29/2016  . Costochondral chest pain 12/28/2015  . Pain in the chest 06/09/2015  . Palpitations 06/09/2015  . Deaf 05/16/2015  . B12 deficiency 05/16/2015  . Cochlear implant status 05/16/2015  . Fatty infiltration of liver 05/16/2015  . Fibromyalgia syndrome 05/16/2015  . Gastro-esophageal reflux disease without esophagitis 05/16/2015  . Generalized headache 05/16/2015  . Right-sided Bell's palsy 05/16/2015  . H/O: depression 05/16/2015  . Hypertriglyceridemia 05/16/2015  . Blood glucose elevated 05/16/2015  . Irregular menstrual cycle  05/16/2015  . Overweight 05/16/2015  . Paresthesia 05/16/2015  . Disorder of labyrinth 05/16/2015  . Dyspnea 10/07/2014  . Moderate persistent intrinsic asthma without status asthmaticus without complication 25/36/6440   Willa Rough DPT, ATC Willa Rough 11/05/2018, 12:34 PM  [place] MAIN [facility] SERVICES [address] [place], [place], [address] Phone: [phone]   Fax:  [phone]  Name: Yesenia Wood MRN: [id number] Date of Birth: 1986/08/25

## 2018-11-05 NOTE — Patient Instructions (Addendum)
  Female version: Dr. Kathline Magic Premium Prostate Massager    Self Internal Trigger Point Relief    1) Wash your hands and prop yourself up in a way where you can easily reach the vagina. You may wish to have a small hand-held mirror near by.  2) lubricate the tool and vaginal opening using a hypoallergenic lubricant such as "slippery-stuff".   3) Slowly and gently insert the tool into the vagina using deep breaths to allow relaxation of the muscles around the tool.  4) Avoiding the "12 o-clock" region near the urethra, gently use the handle of the tool like a lever to press the angled tip of the tool onto the wall of the pelvic floor.   5) Move the tool to different areas of the pelvic floor and feel for areas that are tender called "trigger points". When you find one hold the tool still, applying just enough pressure to elicit mild discomfort and take deep belly breaths until the discomfort subsides or decreases by at least 50%.   6) Repeat the process for any trigger points you find spending between 3-10 minutes on this per night until you do not find any more trigger points or you are told otherwise by your therapist..    * active MET correction/strengthening around z-axis hand-drawn

## 2018-11-06 ENCOUNTER — Encounter: Payer: Self-pay | Admitting: Licensed Clinical Social Worker

## 2018-11-06 ENCOUNTER — Ambulatory Visit (INDEPENDENT_AMBULATORY_CARE_PROVIDER_SITE_OTHER): Payer: Medicare Other | Admitting: Licensed Clinical Social Worker

## 2018-11-06 DIAGNOSIS — F331 Major depressive disorder, recurrent, moderate: Secondary | ICD-10-CM

## 2018-11-06 NOTE — Progress Notes (Signed)
   THERAPIST PROGRESS NOTE  Session Time: 1100  Participation Level: Active  Behavioral Response: DisheveledAlertDepressed  Type of Therapy: Individual Therapy  Treatment Goals addressed: Anxiety  Interventions: CBT  Summary: Yesenia Wood is a 32 y.o. female who presents with symptoms related to her diagnosis. [name] was joined by an Investment banker, corporateASL interpreter, Therapist, musicQueenisha. [name] reported things have been, "the same. I'm still feeling no motivation. It's just gone." [name] also reported difficulty sleeping and stated she feels the medication she was prescribed is keeping her awake instead of helping her sleep. We discussed the importance of sleep as it relates to mood, emotional regulation, and motivation. [name] expressed understanding and agreement, and added she has been sleeping two hours per night, "if she's lucky." She also reported her OCD symptoms have increased recently such as, "checking to make sure the clothes are where I put them, checking the light switches, and making sure the water is turned off by running my hand underneath the faucet." We discussed ways to decrease these behaviors, as [name] reports they have decreased her ability to be social and leave her home. LCSW provided [name] with an exposure hierarchy, and homework to practice decreasing one of her behaviors in order to increase her confidence that if she does not do X then Y won't happen. At first, [name] reported she felt this would be a "waste of time." However, after LCSW explained the activity again, [name] reported it made "a little more sense and she was willing to try it."   Suicidal/Homicidal: No  Therapist Response: We will continue to utilize CBT moving forward to assist [name] in managing her OCD and anxiety related symptoms. We will continue sessions on a monthly basis to address her symptoms and increase if [name] is agreeable.   Plan: Return again in 4 weeks.  Diagnosis: Axis I: MDD (recurrent,  moderate)    Axis II: No diagnosis    Heidi DachKelsey Tilford Deaton, LCSW 11/06/2018

## 2018-11-08 ENCOUNTER — Other Ambulatory Visit: Payer: Self-pay | Admitting: Obstetrics and Gynecology

## 2018-11-12 ENCOUNTER — Ambulatory Visit: Payer: Medicare Other

## 2018-11-23 ENCOUNTER — Ambulatory Visit (INDEPENDENT_AMBULATORY_CARE_PROVIDER_SITE_OTHER): Payer: Medicare Other

## 2018-11-23 ENCOUNTER — Ambulatory Visit (INDEPENDENT_AMBULATORY_CARE_PROVIDER_SITE_OTHER): Payer: Medicare Other | Admitting: Psychiatry

## 2018-11-23 ENCOUNTER — Encounter: Payer: Self-pay | Admitting: Psychiatry

## 2018-11-23 ENCOUNTER — Other Ambulatory Visit: Payer: Self-pay

## 2018-11-23 VITALS — BP 115/84 | HR 88 | Temp 98.6°F | Wt 148.6 lb

## 2018-11-23 DIAGNOSIS — F5105 Insomnia due to other mental disorder: Secondary | ICD-10-CM

## 2018-11-23 DIAGNOSIS — F4010 Social phobia, unspecified: Secondary | ICD-10-CM

## 2018-11-23 DIAGNOSIS — E538 Deficiency of other specified B group vitamins: Secondary | ICD-10-CM | POA: Diagnosis not present

## 2018-11-23 DIAGNOSIS — F331 Major depressive disorder, recurrent, moderate: Secondary | ICD-10-CM | POA: Diagnosis not present

## 2018-11-23 MED ORDER — SERTRALINE HCL 100 MG PO TABS: 100.0000 mg | ORAL_TABLET | Freq: Every day | ORAL | 0 refills | Status: DC

## 2018-11-23 MED ORDER — ZALEPLON 5 MG PO CAPS: 5.0000 mg | ORAL_CAPSULE | Freq: Every evening | ORAL | 0 refills | Status: DC | PRN

## 2018-11-23 MED ORDER — BUPROPION HCL 75 MG PO TABS: 75.0000 mg | ORAL_TABLET | Freq: Every day | ORAL | 0 refills | Status: DC

## 2018-11-23 NOTE — Patient Instructions (Signed)
Zaleplon capsules What is this medicine? ZALEPLON (ZAL e plon) is used to treat insomnia. This medicine helps you to fall asleep. This medicine may be used for other purposes; ask your health care provider or pharmacist if you have questions. COMMON BRAND NAME(S): Sonata What should I tell my health care provider before I take this medicine? They need to know if you have any of these conditions: -depression -history of a drug or alcohol abuse problem -liver disease -lung or breathing disease -sleep-walking, driving, eating or other activity while not fully awake after taking a sleep medicine -suicidal thoughts -an unusual or allergic reaction to zaleplon, other medicines, foods, dyes, or preservatives -pregnant or trying to get pregnant -breast-feeding How should I use this medicine? Take this medicine by mouth with a glass of water. Follow the directions on the prescription label. It is better to take this medicine on an empty stomach and only when you are ready for bed. Do not take your medicine more often than directed. If you have been taking this medicine for several weeks and suddenly stop taking it, you may get unpleasant withdrawal symptoms. Your doctor or health care professional may want to gradually reduce the dose. Do not stop taking this medicine on your own. Always follow your doctor or health care professional's advice. Talk to your pediatrician regarding the use of this medicine in children. Special care may be needed. Overdosage: If you think you have taken too much of this medicine contact a poison control center or emergency room at once. NOTE: This medicine is only for you. Do not share this medicine with others. What if I miss a dose? This does not apply. This medicine should only be taken immediately before going to sleep. Do not take double or extra doses. What may interact with this medicine? -barbiturate medicines for inducing sleep or treating  seizures -carbamazepine -certain medicines for allergies, like azatadine, clemastine, diphenhydramine -certain medicines for depression, anxiety, or other emotional or psychiatric problems -certain medicines for pain -cimetidine -erythromycin -medicines for fungal infections like ketoconazole, fluconazole, or itraconazole -other medicines given for sleep -phenytoin -rifampin This list may not describe all possible interactions. Give your health care provider a list of all the medicines, herbs, non-prescription drugs, or dietary supplements you use. Also tell them if you smoke, drink alcohol, or use illegal drugs. Some items may interact with your medicine. What should I watch for while using this medicine? Visit your doctor or health care professional for regular checks on your progress. Keep a regular sleep schedule by going to bed at about the same time each night. Avoid caffeine-containing drinks in the evening hours. When sleep medicines are used every night for more than a few weeks, they may stop working. Talk to your doctor if you still have trouble sleeping. After taking this medicine, you may get up out of bed and do an activity that you do not know you are doing. The next morning, you may have no memory of this. Activities include driving a car ("sleep-driving"), making and eating food, talking on the phone, sexual activity, and sleep-walking. Serious injuries have occurred. Stop the medicine and call your doctor right away if you find out you have done any of these activities. Do not take this medicine if you have used alcohol that evening. Do not take it if you have taken another medicine for sleep. The risk of doing these sleep-related activities is higher. Do not take this medicine unless you are able to stay in  bed for a full night (7 to 8 hours) before you must be active again. You may have a decrease in mental alertness the day after use, even if you feel that you are fully awake.  Tell your doctor if you will need to perform activities requiring full alertness, such as driving, the next day. Do not stand or sit up quickly after taking this medicine, especially if you are an older patient. This reduces the risk of dizzy or fainting spells. If you or your family notice any changes in your behavior, such as new or worsening depression, thoughts of harming yourself, anxiety, other unusual or disturbing thoughts, or memory loss, call your doctor right away. After you stop taking this medicine, you may have trouble falling asleep. This is called rebound insomnia. This problem usually goes away on its own after 1 or 2 nights. What side effects may I notice from receiving this medicine? Side effects that you should report to your doctor or health care professional as soon as possible: -allergic reactions like skin rash, itching or hives, swelling of the face, lips, or tongue -breathing problems -changes in vision -confusion -depression, suicidal thoughts -feeling faint or lightheaded -hallucinations -hostility, restlessness, excitability -slurred speech -staggering, tremors -unusual activities while not fully awake like driving, eating, making phone calls -unusually weak or tired Side effects that usually do not require medical attention (report to your doctor or health care professional if they continue or are bothersome): -diarrhea -difficulty with coordination -loss of memory -nightmares -stomach upset This list may not describe all possible side effects. Call your doctor for medical advice about side effects. You may report side effects to FDA at 1-800-FDA-1088. Where should I keep my medicine? Keep out of the reach of children. This medicine can be abused. Keep your medicine in a safe place to protect it from theft. Do not share this medicine with anyone. Selling or giving away this medicine is dangerous and against the law. This medicine may cause accidental overdose  and death if taken by other adults, children, or pets. Mix any unused medicine with a substance like cat litter or coffee grounds. Then throw the medicine away in a sealed container like a sealed bag or a coffee can with a lid. Do not use the medicine after the expiration date. Store at room temperature between 20 and 25 degrees C (68 and 77 degrees F). Protect from light. NOTE: This sheet is a summary. It may not cover all possible information. If you have questions about this medicine, talk to your doctor, pharmacist, or health care provider.  2019 [employer] Standard (2018-05-11 12:49:50) Bupropion tablets (Depression/Mood Disorders) What is this medicine? BUPROPION (byoo PROE pee on) is used to treat depression. This medicine may be used for other purposes; ask your health care provider or pharmacist if you have questions. COMMON BRAND NAME(S): Wellbutrin What should I tell my health care provider before I take this medicine? They need to know if you have any of these conditions: -an eating disorder, such as anorexia or bulimia -bipolar disorder or psychosis -diabetes or high blood sugar, treated with medication -glaucoma -heart disease, previous heart attack, or irregular heart beat -head injury or brain tumor -high blood pressure -kidney or liver disease -seizures -suicidal thoughts or a previous suicide attempt -Tourette's syndrome -weight loss -an unusual or allergic reaction to bupropion, other medicines, foods, dyes, or preservatives -breast-feeding -pregnant or trying to become pregnant How should I use this medicine? Take this medicine by mouth with a  glass of water. Follow the directions on the prescription label. You can take it with or without food. If it upsets your stomach, take it with food. Take your medicine at regular intervals. Do not take your medicine more often than directed. Do not stop taking this medicine suddenly except upon the advice of your doctor.  Stopping this medicine too quickly may cause serious side effects or your condition may worsen. A special MedGuide will be given to you by the pharmacist with each prescription and refill. Be sure to read this information carefully each time. Talk to your pediatrician regarding the use of this medicine in children. Special care may be needed. Overdosage: If you think you have taken too much of this medicine contact a poison control center or emergency room at once. NOTE: This medicine is only for you. Do not share this medicine with others. What if I miss a dose? If you miss a dose, take it as soon as you can. If it is less than four hours to your next dose, take only that dose and skip the missed dose. Do not take double or extra doses. What may interact with this medicine? Do not take this medicine with any of the following medications: -linezolid -MAOIs like Azilect, Carbex, Eldepryl, Marplan, Nardil, and Parnate -methylene blue (injected into a vein) -other medicines that contain bupropion like Zyban This medicine may also interact with the following medications: -alcohol -certain medicines for anxiety or sleep -certain medicines for blood pressure like metoprolol, propranolol -certain medicines for depression or psychotic disturbances -certain medicines for HIV or AIDS like efavirenz, lopinavir, nelfinavir, ritonavir -certain medicines for irregular heart beat like propafenone, flecainide -certain medicines for Parkinson's disease like amantadine, levodopa -certain medicines for seizures like carbamazepine, phenytoin, phenobarbital -cimetidine -clopidogrel -cyclophosphamide -digoxin -furazolidone -isoniazid -nicotine -orphenadrine -procarbazine -steroid medicines like prednisone or cortisone -stimulant medicines for attention disorders, weight loss, or to stay awake -tamoxifen -theophylline -thiotepa -ticlopidine -tramadol -warfarin This list may not describe all possible  interactions. Give your health care provider a list of all the medicines, herbs, non-prescription drugs, or dietary supplements you use. Also tell them if you smoke, drink alcohol, or use illegal drugs. Some items may interact with your medicine. What should I watch for while using this medicine? Tell your doctor if your symptoms do not get better or if they get worse. Visit your doctor or health care professional for regular checks on your progress. Because it may take several weeks to see the full effects of this medicine, it is important to continue your treatment as prescribed by your doctor. Patients and their families should watch out for new or worsening thoughts of suicide or depression. Also watch out for sudden changes in feelings such as feeling anxious, agitated, panicky, irritable, hostile, aggressive, impulsive, severely restless, overly excited and hyperactive, or not being able to sleep. If this happens, especially at the beginning of treatment or after a change in dose, call your health care professional. Avoid alcoholic drinks while taking this medicine. Drinking excessive alcoholic beverages, using sleeping or anxiety medicines, or quickly stopping the use of these agents while taking this medicine may increase your risk for a seizure. Do not drive or use heavy machinery until you know how this medicine affects you. This medicine can impair your ability to perform these tasks. Do not take this medicine close to bedtime. It may prevent you from sleeping. Your mouth may get dry. Chewing sugarless gum or sucking hard candy, and drinking plenty of  water may help. Contact your doctor if the problem does not go away or is severe. What side effects may I notice from receiving this medicine? Side effects that you should report to your doctor or health care professional as soon as possible: -allergic reactions like skin rash, itching or hives, swelling of the face, lips, or tongue -breathing  problems -changes in vision -confusion -elevated mood, decreased need for sleep, racing thoughts, impulsive behavior -fast or irregular heartbeat -hallucinations, loss of contact with reality -increased blood pressure -redness, blistering, peeling or loosening of the skin, including inside the mouth -seizures -suicidal thoughts or other mood changes -unusually weak or tired -vomiting Side effects that usually do not require medical attention (report to your doctor or health care professional if they continue or are bothersome): -constipation -headache -loss of appetite -nausea -tremors -weight loss This list may not describe all possible side effects. Call your doctor for medical advice about side effects. You may report side effects to FDA at 1-800-FDA-1088. Where should I keep my medicine? Keep out of the reach of children. Store at room temperature between 20 and 25 degrees C (68 and 77 degrees F), away from direct sunlight and moisture. Keep tightly closed. Throw away any unused medicine after the expiration date. NOTE: This sheet is a summary. It may not cover all possible information. If you have questions about this medicine, talk to your doctor, pharmacist, or health care provider.  2019 [employer] Standard (2016-05-06 13:44:21)

## 2018-11-23 NOTE — Progress Notes (Signed)
BH MD OP Progress Note  11/23/2018 10:33 AM Yesenia Wood  MRN:  [id number]  Chief Complaint: ' I am here for follow up." Chief Complaint    Follow-up; Medication Problem; Fatigue; Insomnia     HPI: Yesenia Wood is a 32 year old Caucasian female, unemployed, single, lives in [place], has a history of depression, anxiety, insomnia, migraine headaches, vitamin B12 deficiency, bilateral hearing loss, tachycardia, is mute, presented to the clinic today for a follow-up visit.  Patient presented along with her ASL interpreter who assisted with all evaluation today-Pat.  Patient today reports She continues to struggle with some mood symptoms.  She reports she has low energy, lack of motivation.  She reports she has to force herself to take a shower or do cleaning around the house.  She reports this itself makes her depressed.  She continues to have some anxiety symptoms on and off.  She is currently on Zoloft 150 mg.  She does not think the Zoloft is helpful anymore.  She also struggles with sleep.  She is on mirtazapine but does not think it is helpful at this time.  She reports she has difficulty falling asleep and has interrupted sleep.  She also has a history of being diagnosed with restless leg syndrome in the past and is currently not on medications.  Patient also is on birth control pills and wonders whether that could be affecting her mood.  Patient denies any suicidality.  Patient denies any homicidality.  Patient reports her Christmas holidays went well and she spent it with her family.  Patient has upcoming appointment with her therapist here in clinic.  She looks forward to the same.  Visit Diagnosis:    ICD-10-CM   1. MDD (major depressive disorder), recurrent episode, moderate (HCC) F33.1 sertraline (ZOLOFT) 100 MG tablet  2. Social anxiety disorder F40.10 sertraline (ZOLOFT) 100 MG tablet  3. Insomnia due to mental condition F51.05     Past Psychiatric History: Have reviewed past  psychiatric history from my progress note on 09/10/2018.  Past trials of Zoloft, Cymbalta, Pamelor, gabapentin, Abilify, Belsomra, hydroxyzine, trazodone, Ambien, Remeron.  Past Medical History:  Past Medical History:  Diagnosis Date  . ADHD (attention deficit hyperactivity disorder)   . B12 deficiency   . Bell's palsy    right sided  . Chronic migraine   . Deaf   . Depression   . 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 Fatigue   . Fatty liver   . Fibromyalgia   . GERD (gastroesophageal reflux disease)   . Headache   . High triglycerides   . Hyperglycemia   . Hypertension   . Hyperthyroidism   . Moderate asthma   . Obesity   . Paresthesia   . Premature birth     Past Surgical History:  Procedure Laterality Date  . BARTHOLIN GLAND CYST REMOVAL    . COCHLEAR IMPLANT    . COCHLEAR IMPLANT REMOVAL    . LAPAROSCOPIC TUBAL LIGATION Bilateral 03/26/2018   Procedure: LAPAROSCOPIC TUBAL LIGATION;  Surgeon: Hildred Laser, MD;  Location: [facility] ORS;  Service: Gynecology;  Laterality: Bilateral;  with peritoneal biopsies  . LAPAROSCOPY    . MYRINGOTOMY WITH TUBE PLACEMENT    . TUBAL LIGATION  03/26/2018    Family Psychiatric History: Have reviewed family psychiatric history from my progress note on 09/10/2018  Family History:  Family History  Adopted: Yes  Family history unknown: Yes    Social History: Reviewed  social history from my progress note on 09/10/2018 Social History   Socioeconomic History  . Marital status: Single    Spouse name: Not on file  . Number of children: 0  . Years of education: Not on file  . Highest education level: Some college, no degree  Occupational History  . Occupation: disability for hearing loss  Social Needs  . Financial resource strain: Somewhat hard  . Food insecurity:    Worry: Never true    Inability: Never true  . Transportation needs:    Medical: No    Non-medical: No  Tobacco  Use  . Smoking status: Former Smoker    Packs/day: 1.00    Years: 2.00    Pack years: 2.00    Types: Cigarettes    Start date: 11/28/2004    Last attempt to quit: 11/28/2006    Years since quitting: 11.9  . Smokeless tobacco: Never Used  Substance and Sexual Activity  . Alcohol use: Not Currently    Alcohol/week: 0.0 standard drinks    Frequency: Never    Comment: occass  . Drug use: No  . Sexual activity: Not Currently    Birth control/protection: Pill    Comment: Seasonale  Lifestyle  . Physical activity:    Days per week: 7 days    Minutes per session: 150+ min  . Stress: To some extent  Relationships  . Social connections:    Talks on phone: Once a week    Gets together: Never    Attends religious service: Never    Active member of club or organization: No    Attends meetings of clubs or organizations: Never    Relationship status: Never married  Other Topics Concern  . Not on file  Social History Narrative   Lives with her adopted  parents   Hearing loss since birth     Allergies:  Allergies  Allergen Reactions  . Budesonide-Formoterol Fumarate Anaphylaxis and Other (See Comments)    Other reaction(s): Other (See Comments) chest pain Chest pain chest pain  . Astelin [Azelastine]     Tingling, numbness, nausea  . Gabapentin Nausea And Vomiting  . Nortriptyline Other (See Comments)    Other reaction(s): Other (See Comments)  . Diclofenac Other (See Comments)    Other reaction(s): Other (See Comments)  . Duloxetine Hcl Other (See Comments)    Other reaction(s): Other (See Comments)  . Montelukast Sodium Other (See Comments)  . Mupirocin Other (See Comments)  . Naproxen Other (See Comments) and Rash  . Naproxen Sodium Rash    mild rash  . Oxycodone Hcl Rash  . Sulfa Antibiotics Hives and Rash    Metabolic Disorder Labs: Lab Results  Component Value Date   HGBA1C 4.9 01/29/2018   MPG 94 01/29/2018   MPG 97 10/10/2016   No results found for:  PROLACTIN Lab Results  Component Value Date   CHOL 131 09/26/2018   TRIG 87 09/26/2018   HDL 45 (L) 09/26/2018   CHOLHDL 2.9 09/26/2018   VLDL 20 10/10/2016   LDLCALC 69 09/26/2018   LDLCALC 195 (H) 06/22/2018   Lab Results  Component Value Date   TSH 2.84 01/29/2018   TSH 1.649 03/02/2016    Therapeutic Level Labs: No results found for: LITHIUM No results found for: VALPROATE No components found for:  CBMZ  Current Medications: Current Outpatient Medications  Medication Sig Dispense Refill  . acetaminophen (TYLENOL) 500 MG tablet Take 1,000 mg by mouth every 8 (eight) hours as  needed for headache.    . benzonatate (TESSALON) 100 MG capsule Take 1-2 capsules (100-200 mg total) by mouth 2 (two) times daily as needed. 40 capsule 0  . dicyclomine (BENTYL) 20 MG tablet TAKE 1 TABLET BY MOUTH TWICE A DAY AS NEEDED FOR SPASMS 180 tablet 1  . diphenhydrAMINE (BENADRYL) 50 MG tablet Take 50 mg by mouth at bedtime as needed for itching.    . Elagolix Sodium (ORILISSA) 150 MG TABS Take 1 tablet by mouth daily. 30 tablet 0  . neomycin-bacitracin-polymyxin (NEOSPORIN) OINT Apply 1 application topically 2 (two) times daily. 14 g 0  . norgestimate-ethinyl estradiol (ORTHO-CYCLEN,SPRINTEC,PREVIFEM) 0.25-35 MG-MCG tablet Take 1 tablet by mouth daily. 1 Package 11  . rosuvastatin (CRESTOR) 20 MG tablet TAKE 1 TABLET BY MOUTH EVERY DAY 90 tablet 0  . sertraline (ZOLOFT) 100 MG tablet Take 1 tablet (100 mg total) by mouth daily. 135 tablet 0  . buPROPion (WELLBUTRIN) 75 MG tablet Take 1 tablet (75 mg total) by mouth daily after breakfast. For mood 30 tablet 0  . diltiazem (CARDIZEM CD) 120 MG 24 hr capsule Take 1 capsule (120 mg total) by mouth daily. 90 capsule 3  . metoprolol succinate (TOPROL-XL) 25 MG 24 hr tablet Take 1 tablet (25 mg total) by mouth daily. Take with or immediately following a meal. 90 tablet 3  . zaleplon (SONATA) 5 MG capsule Take 1 capsule (5 mg total) by mouth at bedtime as  needed for sleep. 30 capsule 0   Current Facility-Administered Medications  Medication Dose Route Frequency Provider Last Rate Last Dose  . cyanocobalamin ((VITAMIN B-12)) injection 1,000 mcg  1,000 mcg Intramuscular Once Alba CorySowles, Krichna, MD         Musculoskeletal: Strength & Muscle Tone: within normal limits Gait & Station: normal Patient leans: N/A  Psychiatric Specialty Exam: Review of Systems  Psychiatric/Behavioral: The patient is nervous/anxious and has insomnia.   All other systems reviewed and are negative.   Blood pressure 115/84, pulse 88, temperature 98.6 F (37 C), temperature source Oral, weight 148 lb 9.6 oz (67.4 kg).Body mass index is 26.32 kg/m.  General Appearance: Casual  Eye Contact:  Fair  Speech:  Normal Rate  Volume:  Normal  Mood:  Dysphoric  Affect:  Appropriate  Thought Process:  Goal Directed and Descriptions of Associations: Intact  Orientation:  Full (Time, Place, and Person)  Thought Content: Logical   Suicidal Thoughts:  No  Homicidal Thoughts:  No  Memory:  Immediate;   Fair Recent;   Fair Remote;   Fair  Judgement:  Fair  Insight:  Fair  Psychomotor Activity:  Normal  Concentration:  Concentration: Fair and Attention Span: Fair  Recall:  FiservFair  Fund of Knowledge: Fair  Language: Fair  Akathisia:  No  Handed:  Right  AIMS (if indicated): denies tremors, rigidity,stiffness  Assets:  Communication Skills Desire for Improvement Social Support  ADL's:  Intact  Cognition: WNL  Sleep:  Poor   Screenings: GAD-7     [facility]  Total GAD-7 Score  14  7  12  7     PHQ2-9     [facility]  [facility]  Visit from 01/29/2018 in [facility] Office Visit from 04/11/2017 in [facility]  PHQ-2 Total Score  3  2  2  2  1   PHQ-9 Total Score  17  12  8  9   -       Assessment and Plan: [name] is a 32 year old Caucasian female who is single, unemployed, lives in [place], has a history of depression, anxiety, multiple medical problems, is deaf and mute, presented to the clinic today for a follow-up visit.  Patient with psychosocial stressors of multiple health issues.  She also has a history of trauma.  Patient continues to struggle with sleep and also motivation.  We will continue to make medication changes.  Plan For MDD Reduce Zoloft to 100 mg p.o. daily Start Wellbutrin 75 mg p.o. daily  For social anxiety disorder Zoloft as prescribed Continue CBT  Insomnia Stop Remeron for lack of efficacy Start Sonata 5 mg p.o. nightly  Patient to work on sleep hygiene.  She will continue psychotherapy sessions.  Follow-up in clinic in 2 to 3 weeks or sooner if needed.  More than 50 % of the time was spent for psychoeducation and supportive psychotherapy and care coordination.  This note was generated in part or whole with voice recognition software. Voice recognition is usually quite accurate but there are transcription errors that can and very often do occur. I apologize for any typographical errors that were not detected and corrected.        Jomarie Longs, MD 11/23/2018, 10:33 AM

## 2018-11-23 NOTE — Progress Notes (Signed)
Patient came in for her B-12 injection. She tolerated it well. NKDA.   

## 2018-11-26 ENCOUNTER — Encounter: Payer: Medicare Other | Admitting: Physical Therapy

## 2018-11-27 ENCOUNTER — Ambulatory Visit: Payer: Medicare Other

## 2018-11-27 DIAGNOSIS — M62838 Other muscle spasm: Secondary | ICD-10-CM

## 2018-11-27 DIAGNOSIS — M629 Disorder of muscle, unspecified: Secondary | ICD-10-CM | POA: Diagnosis not present

## 2018-11-27 DIAGNOSIS — M6289 Other specified disorders of muscle: Secondary | ICD-10-CM

## 2018-11-27 DIAGNOSIS — R293 Abnormal posture: Secondary | ICD-10-CM | POA: Diagnosis not present

## 2018-11-27 DIAGNOSIS — M791 Myalgia, unspecified site: Secondary | ICD-10-CM | POA: Diagnosis not present

## 2018-11-27 NOTE — Therapy (Addendum)
[facility] MAIN [facility] SERVICES [address] [place], [place], [address] Phone: [phone]   Fax:  [phone]  Physical Therapy Treatment  Patient Details  Name: Yesenia Wood MRN: [id number] Date of Birth: 05/26/1986 Referring Provider (PT): Rubie Maid   Encounter Date: 11/27/2018    Past Medical History:  Diagnosis Date  . ADHD (attention deficit hyperactivity disorder)   . B12 deficiency   . Bell's palsy    right sided  . Chronic migraine   . Deaf   . Depression   . 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 Fatigue   . Fatty liver   . Fibromyalgia   . GERD (gastroesophageal reflux disease)   . Headache   . High triglycerides   . Hyperglycemia   . Hypertension   . Hyperthyroidism   . Moderate asthma   . Obesity   . Paresthesia   . Premature birth     Past Surgical History:  Procedure Laterality Date  . BARTHOLIN GLAND CYST REMOVAL    . COCHLEAR IMPLANT    . COCHLEAR IMPLANT REMOVAL    . LAPAROSCOPIC TUBAL LIGATION Bilateral 03/26/2018   Procedure: LAPAROSCOPIC TUBAL LIGATION;  Surgeon: Rubie Maid, MD;  Location: [facility] ORS;  Service: Gynecology;  Laterality: Bilateral;  with peritoneal biopsies  . LAPAROSCOPY    . MYRINGOTOMY WITH TUBE PLACEMENT    . TUBAL LIGATION  03/26/2018    There were no vitals filed for this visit.       Pelvic Floor Physical Therapy Treatment Note  SCREENING  Changes in medications, allergies, or medical history?: Added Bupropion,  Zaleplon     SUBJECTIVE  Patient reports: Her pain increases with the exercises and stays high for 2-3 days. She is doing the exercises every 2-3 days.  Precautions:  fibromyalgia  Pain update:  Location of pain: B hips, lower pelvis  Current pain:  2/10  Max pain:  7/10 Least pain:  2/10 Nature of pain: achy/sharp  Patient Goals: Be able to walk comfortably and do activities around the  house without pain.    OBJECTIVE  Changes in:  Abdominal:  Able to maintain table-top without increased pain but addition of arm lift increases pain and full bird-dog is unstable and poorly coordinated.  Palpation: Greatest restriction in a PA glide of fascia and pain at L PSIS on L  Greatest restriction in AP glide of fascia on R with pain at anterior hip into pubic region.  TTP through glute min.  Gait Analysis:  INTERVENTIONS THIS SESSION: Manual: performed MFR and STM to B posterior/lateral and anterior hip.  Self-care: reviewed importance of strengthening to allow for decreased pain. Trouble-shot vaginal valium and reinforced importance of beginning internal TP release with tool internally. Therex: reviewed and practiced mod. Bird-dog with arms only to decrease load on the lumbar spine and improve performance.    Total time: 60 min.                         PT Short Term Goals - 11/29/18 0856      PT SHORT TERM GOAL #1   Title  Patient will demonstrate improved pelvic allignment in standing and supine for improved pelvic position for PFM length/tension relationship.    Time  5    Period  Weeks    Status  Achieved    Target Date  01/15/18  PT SHORT TERM GOAL #2   Title  Patient will undergo Internal/External assessment of the PFM to allow or direct treatment of PFM spasms.    Baseline  Pt. is avoidant of all internal and external pelvic exam    Time  5    Period  Weeks    Status  Not Met    Target Date  01/16/18      PT SHORT TERM GOAL #3   Title  Patient will demonstrate appropriate body mechanics with bending and lifting heavy objects to allow for decreased stress on the pelvic floor and low back.     Time  5    Period  Weeks    Status  Achieved    Target Date  01/15/18      PT SHORT TERM GOAL #4   Title  Patient will perform self external TP release to decrease spasm and sensitivity of nerves to allow for ecreased pain.    Baseline   Pt. has reported trying this at home but seems to still be avoidant of touch.    Time  5    Period  Weeks    Status  Achieved    Target Date  07/30/18      PT SHORT TERM GOAL #5   Title  Patient will describe Max pain of no greater than 3/10 over prior week to demonstrate symptom improvement and improved quality of life.    Baseline  As on 12/9: max pain of 5/10    Time  5    Period  Weeks    Status  On-going    Target Date  02/07/19        PT Long Term Goals - 11/29/18 0857      PT LONG TERM GOAL #1   Title  Patient will score at or below 8/43 on the Female NIH-CPSI and 5 on the VQ to demonstrate a clinically meaningful decrease in disability and distress due to pelvic floor dysfunction.    Baseline  NIH- CPSI: 18/43, VQ: 10/33 As of 12/9: NIH- CPSI: 12/43, VQ: 15/33    Time  10    Period  Weeks    Status  On-going    Target Date  02/07/19      PT LONG TERM GOAL #2   Title  Patient will demonstrate ability to use a tool for self TP release to allow for maintenence of spasm release upon D/C.    Time  10    Period  Weeks    Status  On-going    Target Date  02/07/19      PT LONG TERM GOAL #3   Title  Patient will report a reduction in pain to no greater than 2/10 over the prior week to demonstrate symptom improvement.    Baseline  As of 12/9: Max of 5/10 over past 2 weeks    Time  10    Period  Weeks    Status  On-going    Target Date  02/07/19      PT LONG TERM GOAL #4   Title  Patient will demonstrate improved sitting and standing posture to demonstrate learning and decrease stress on the pelvic floor with functional activity.    Baseline  Pt. demonstrates improved posture ~ 85% of the time.    Time  10    Period  Weeks    Status  Achieved    Target Date  08/01/19      PT LONG TERM  GOAL #5   Title  Patient will perform regular exercise over the prior 2 weeks to demonstrate improved QOL and ability to maintain symptom improvement upon D/C    Time  10    Period   Weeks    Status  Achieved    Target Date  07/31/18            Plan - 12/05/18 1312    Clinical Impression Statement  The patient has agreed, however to buy a tool that would allow her to perform self internal TP release at home to see if we can break the parrern of spasms in the pelvis if we can begin to break the chronic pain cycle and prevent return of other spasms and pain as well. Pt. was educated on the potential benefit of adding vaginal valium to assist her in her ability to perform the internal release and help break the pain cycle and referred to her MD to determine if this is an appropriate option for her at this time. We will continue at current frrequency of every-other-week for 5 more visits over 10 weeks following the current POC to follow-up on success with home internal TP release and help guide treatment as well as to continue to progress deep-core strengthening to allow for relaxation of internal PFM. She responded well to all interventions today, demonstrating understanding of all education provided and intention to follow up with buying tool next week when she gets a check. She has made significant progress in her posture and consistency with her HEP but continues to have pain ranging from 3-5/10 over the course of the week which is down from a maximum of 7-8/10 but has not significantly changed over the last 6 weeks regardless of improvement in body echanics and strength. We discussed the limitations in her treatment due to her discomfort with having an internal exam or treatment performed and not tolerating dry needling well. Aquatic therapy was suggested as an option to improve deep core strengthening without aggrivating fibromyalgia or overloading the spine but Pt. was not interested. We also discussed that a higher frequency could help [place] build upon improvements more steadily but this is not possible for the Pt. at this time due to lack of transportation    History and Personal  Factors relevant to plan of care:  Deaf, Fibromyalgia, multiple co-morbidities    Clinical Presentation  Stable    Clinical Decision Making  High    Rehab Potential  Fair    Clinical Impairments Affecting Rehab Potential  Deaf, multiple co-morbidities, fibromyalgia, fear of direct pelvic exam and treatment.    PT Frequency  Biweekly    PT Duration  Other (comment)   10 weeks   PT Treatment/Interventions  ADLs/Self Care Home Management;Aquatic Therapy;Biofeedback;Moist Heat;Traction;Ultrasound;Balance training;Gait training;Functional mobility training;Therapeutic activities;Therapeutic exercise;Neuromuscular re-education;Patient/family education;Orthotic Fit/Training;Scar mobilization;Manual techniques;Manual lymph drainage;Dry needling;Taping    PT Next Visit Plan  Ask about self-internal release success and answer questions. re-assess pelvic alignment and review new active MET exercise for performance, Increase strengthening exercises.    PT Home Exercise Plan  diaphragmatic breathing, TA in quadruped, child's pose, adductor stretch, piriformis stretch, squatty potty, L side stretch, hip abduction, tall posture walking.  Bird-dog, multi-stepping, mini-marches, prone press-ups, TP release with lacrosse ball., Heels squeeze in Prone, thoracic extensions over towel roll, self active MET in half-kneel.     Consulted and Agree with Plan of Care  Patient       Patient will benefit from skilled therapeutic intervention in  order to improve the following deficits and impairments:  Dizziness, Increased fascial restricitons, Impaired sensation, Improper body mechanics, Pain, Decreased coordination, Hypermobility, Postural dysfunction, Increased muscle spasms, Impaired tone, Decreased balance, Difficulty walking  Visit Diagnosis: Other muscle spasm - Plan: PT plan of care cert/re-cert  Abnormal posture - Plan: PT plan of care cert/re-cert  Muscular imbalance - Plan: PT plan of care  cert/re-cert  Myalgia - Plan: PT plan of care cert/re-cert     Problem List Patient Active Problem List   Diagnosis Date Noted  . 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 OSA (obstructive sleep apnea) 10/10/2016  . Other insomnia 10/10/2016  . 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 Sleep disturbance 05/10/2016  . Chronic migraine without aura 03/29/2016  . Costochondral chest pain 12/28/2015  . Pain in the chest 06/09/2015  . Palpitations 06/09/2015  . Deaf 05/16/2015  . B12 deficiency 05/16/2015  . Cochlear implant status 05/16/2015  . Fatty infiltration of liver 05/16/2015  . Fibromyalgia syndrome 05/16/2015  . Gastro-esophageal reflux disease without esophagitis 05/16/2015  . Generalized headache 05/16/2015  . Right-sided Bell's palsy 05/16/2015  . H/O: depression 05/16/2015  . Hypertriglyceridemia 05/16/2015  . Blood glucose elevated 05/16/2015  . Irregular menstrual cycle 05/16/2015  . Overweight 05/16/2015  . Paresthesia 05/16/2015  . Disorder of labyrinth 05/16/2015  . Dyspnea 10/07/2014  . Moderate persistent intrinsic asthma without status asthmaticus without complication 88/09/314   Willa Rough DPT, ATC Willa Rough 12/05/2018, 1:13 PM  [place] MAIN [facility] SERVICES [address] [place], [place], [address] Phone: [phone]   Fax:  [phone]  Name: Yesenia Wood MRN: [id number] Date of Birth: 1986/09/03

## 2018-11-27 NOTE — Patient Instructions (Signed)
 *   Do a skin pull by using the flat side of your hand to gently pull the skin near your hip bones on the front up toward your head and breathe deeply to release underlying scar tissue and decrease pain. Try using a heating pad first to warm the tissue if you can.   * Follow up with your doctor about getting non-compounded valium to use vaginally, get the tool for internal use ASAP.

## 2018-11-29 ENCOUNTER — Encounter: Payer: Self-pay | Admitting: Licensed Clinical Social Worker

## 2018-11-29 ENCOUNTER — Ambulatory Visit (INDEPENDENT_AMBULATORY_CARE_PROVIDER_SITE_OTHER): Payer: Medicare Other | Admitting: Licensed Clinical Social Worker

## 2018-11-29 DIAGNOSIS — F331 Major depressive disorder, recurrent, moderate: Secondary | ICD-10-CM

## 2018-11-29 NOTE — Progress Notes (Signed)
   THERAPIST PROGRESS NOTE  Session Time: 0900  Participation Level: Active  Behavioral Response: Well GroomedAlertDepressed  Type of Therapy: Individual Therapy  Treatment Goals addressed: Coping  Interventions: CBT  Summary: Yesenia Wood is a 33 y.o. female who presents with continued symptoms related to her diagnosis.Yesenia Wood was joined by her interpreter, Dennie Bible, to help facilitate the session. Yesenia Wood reports increased depression since our last session, but reported she spoke with her MD and MD changed her medication. She reports being on the new medication for 5 days, but has not seen a difference yet. We discussed how medications work, and the need to continue to see a result. LCSW ensured Yesenia Wood's depression had no increased since being placed on the new medication as well. Yesenia Wood completed her OCD hierarchy worksheet, and reported the most distressing compulsive behavior is pulling her pubic hair. She reports engaging in this activity for 30-60 minutes 5-6 times per day since the 5th grade. We discussed ways to utilize distraction to decrease this behavior, and discussed ways to set limits on the amount of time she spends engaging in the activity. Over the next few weeks, LCSW asked Yesenia Wood to set a timer when she notes she is pulling hair and only allow herself 20 minutes to engage in the behavior versus 60 minutes. Yesenia Wood was in agreement with this plan as well. Yesenia Wood again discussed the idea of her anxiety/depression being linked to her being deaf, but stated she is not willing to get another implant and her insurance will not pay for hearing aids. The interpreter added she is aware of Medicare Advantage plans that will pay for hearing aids, which Yesenia Wood was happy to hear. We discussed ways Yesenia Wood could emphasize her other senses when leaving the house to decrease her anxiety. We discussed distraction via noticing various things in her environment. Yesenia Wood expressed understanding and agreement  with this idea.   Suicidal/Homicidal: No  Therapist Response: Yesenia Wood continues to report increased anxiety and depression but reports 0 SI. She is receptive to suggestions for LCSW most times, and is willing to attempt new methods to manage her anxiety/depression symptoms. Moving forward, we will continue utilizing CBT and DBT skills.   Plan: Return again in 4 weeks.  Diagnosis: Axis I: MDD (recurrent, moderate)    Axis II: No diagnosis    Yesenia Dach, LCSW 11/29/2018

## 2018-11-29 NOTE — Addendum Note (Signed)
Addended by: Flora Lipps T on: 11/29/2018 09:02 AM   Modules accepted: Orders

## 2018-12-06 ENCOUNTER — Other Ambulatory Visit: Payer: Self-pay | Admitting: Family Medicine

## 2018-12-06 DIAGNOSIS — G43111 Migraine with aura, intractable, with status migrainosus: Secondary | ICD-10-CM | POA: Diagnosis not present

## 2018-12-06 DIAGNOSIS — G43109 Migraine with aura, not intractable, without status migrainosus: Secondary | ICD-10-CM | POA: Diagnosis not present

## 2018-12-06 DIAGNOSIS — G518 Other disorders of facial nerve: Secondary | ICD-10-CM | POA: Diagnosis not present

## 2018-12-06 DIAGNOSIS — M542 Cervicalgia: Secondary | ICD-10-CM | POA: Diagnosis not present

## 2018-12-06 DIAGNOSIS — G43719 Chronic migraine without aura, intractable, without status migrainosus: Secondary | ICD-10-CM | POA: Diagnosis not present

## 2018-12-06 DIAGNOSIS — M791 Myalgia, unspecified site: Secondary | ICD-10-CM | POA: Diagnosis not present

## 2018-12-06 NOTE — Telephone Encounter (Signed)
Refill Request for Cholesterol medication. Crestor 20 mg  Last physical: 04/11/2017  Lab Results  Component Value Date   CHOL 131 09/26/2018   HDL 45 (L) 09/26/2018   LDLCALC 69 09/26/2018   TRIG 87 09/26/2018   CHOLHDL 2.9 09/26/2018    Follow up: 01/03/2019

## 2018-12-10 ENCOUNTER — Encounter: Payer: Self-pay | Admitting: Obstetrics and Gynecology

## 2018-12-12 ENCOUNTER — Encounter: Payer: Medicare Other | Admitting: Physical Therapy

## 2018-12-12 ENCOUNTER — Ambulatory Visit: Payer: Medicare Other | Attending: Obstetrics and Gynecology

## 2018-12-12 ENCOUNTER — Telehealth: Payer: Self-pay | Admitting: Obstetrics and Gynecology

## 2018-12-12 DIAGNOSIS — M629 Disorder of muscle, unspecified: Secondary | ICD-10-CM | POA: Diagnosis not present

## 2018-12-12 DIAGNOSIS — M791 Myalgia, unspecified site: Secondary | ICD-10-CM | POA: Diagnosis not present

## 2018-12-12 DIAGNOSIS — M62838 Other muscle spasm: Secondary | ICD-10-CM | POA: Diagnosis not present

## 2018-12-12 DIAGNOSIS — R293 Abnormal posture: Secondary | ICD-10-CM | POA: Diagnosis not present

## 2018-12-12 DIAGNOSIS — M6289 Other specified disorders of muscle: Secondary | ICD-10-CM

## 2018-12-12 NOTE — Patient Instructions (Signed)
Wear your heel-lift for ~ 1 hour the first day and add 1/2 an hour each day, building up to a full day's wear. (1 hour, 1.5 hours, 2 hours, etc) Once you have reached a full day's wear, keep the heel-lift in as much as possible to allow for decreased shifting in you pelvis with walking and therefore, decreased pain. Continue with your exercises.  Home) Hip: Internal Rotation - Sitting    Opposite side toward anchor, right foot in handle. Pull foot out. May hold chair to keep body still. Exhale as you pull away from the band. Repeat _10___ times per set. Do __3__ sets per session. Do __4__ sessions per week.    ANKLE: Eversion, Unilateral (Band)    Place band around left foot. Keeping heel in place, raise toes of banded foot up and away from body. Do not move hip. Hold _2__ seconds. _10__ reps per set, _3__ sets per day, _4__ days per week   Put one of these in your RIGHT shoe

## 2018-12-12 NOTE — Therapy (Signed)
[place] [facility] SERVICES [address] [place], [place], [address] Phone: [phone]   Fax:  [phone]  Physical Therapy Treatment  Patient Details  Name: Yesenia Wood MRN: [id number] Date of Birth: 1986-02-07 No data recorded  Encounter Date: 12/12/2018  PT End of Session - 12/14/18 1048    Visit Number  20    Number of Visits  24    Date for PT Re-Evaluation  02/07/19    Authorization Type  Medicare    Authorization - Visit Number  5    Authorization - Number of Visits  5    PT Start Time  0905    PT Stop Time  1005    PT Time Calculation (min)  60 min    Activity Tolerance  Patient tolerated treatment well    Behavior During Therapy  [facility] for tasks assessed/performed       Past Medical History:  Diagnosis Date  . ADHD (attention deficit hyperactivity disorder)   . B12 deficiency   . Bell's palsy    right sided  . Chronic migraine   . Deaf   . Depression   . 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 Fatigue   . Fatty liver   . Fibromyalgia   . GERD (gastroesophageal reflux disease)   . Headache   . High triglycerides   . Hyperglycemia   . Hypertension   . Hyperthyroidism   . Moderate asthma   . Obesity   . Paresthesia   . Premature birth     Past Surgical History:  Procedure Laterality Date  . BARTHOLIN GLAND CYST REMOVAL    . COCHLEAR IMPLANT    . COCHLEAR IMPLANT REMOVAL    . LAPAROSCOPIC TUBAL LIGATION Bilateral 03/26/2018   Procedure: LAPAROSCOPIC TUBAL LIGATION;  Surgeon: Rubie Maid, MD;  Location: [facility] ORS;  Service: Gynecology;  Laterality: Bilateral;  with peritoneal biopsies  . LAPAROSCOPY    . MYRINGOTOMY WITH TUBE PLACEMENT    . TUBAL LIGATION  03/26/2018    There were no vitals filed for this visit.      Pelvic Floor Physical Therapy Treatment Note  SCREENING  Changes in medications, allergies, or medical history?:  no    SUBJECTIVE  Patient reports: She is not doing well from the fibromyalgia. Having pain all over. She feels awkward doing the exercises but feels like she is doing well overall. Feels that pain is still increasing following exercises.  Precautions:  Deaf, fibromyalgia.  Pain update:  Location of pain: "all over" Current pain:  4/10  Max pain:  7/10 Least pain:  2/10 Nature of pain: ache, sharp jolts  Patient Goals: Be able to walk comfortably and do activities around the house without pain.   OBJECTIVE  Changes in: Posture/Observations:  LLE 78cm, RLE 79cm. R PSIS and ASIS high in standing, only slight anterior rotation of R innominate remaining, significant improvement allowed for accurate measurement of leg-length. R foot pronates more than L in WB.  RLE long in sitting and supine with special test.  Range of Motion/Flexibilty:  Slightly reduced mobility in R arch  Palpation: TTP to R FHB   Gait Analysis: Mild vaulting over RLE and ER of R hip  INTERVENTIONS THIS SESSION: Self-care: discussed use of tool internally, educated on increasing duration of hold in one location to allow for spasm to release rather than just holding it for 1 min in each spot. Reviewed  the importance of following up on the valium vaginally to allow for improved success with internal release.  Manual: re-assessed pelvic alignment and leg-length as well as foot mobility and alignment. Performed gentle mobs to mid-tarsal bones to allow for arch support without increased pain. There-act: given heel-lift for L shoe (2/3 of lift) and educated on how to gradually increase her wear time with the heel-lift to allow for her body to accept the change without over-reacting. Educated on arch support for the R foot to decrease the arch fall, improve alignment of the LE and help decrease spasm of hip ER's. Therex: educated on and practiced hip internal rotation and inversion of the ankle against a  theraband to strengthen and improve balance of musculature in postural support roles to support improved posture and decreased pressure on nerve roots and pain.  Total time: 60 min.                        PT Education - 12/14/18 1048    Education provided  Yes    Education Details  See Pt. Instructions and Interventions this session.    Person(s) Educated  Patient    Methods  Explanation;Demonstration;Verbal cues;Tactile cues;Handout    Comprehension  Verbalized understanding;Returned demonstration;Verbal cues required;Need further instruction;Tactile cues required       PT Short Term Goals - 11/29/18 0856      PT SHORT TERM GOAL #1   Title  Patient will demonstrate improved pelvic allignment in standing and supine for improved pelvic position for PFM length/tension relationship.    Time  5    Period  Weeks    Status  Achieved    Target Date  01/15/18      PT SHORT TERM GOAL #2   Title  Patient will undergo Internal/External assessment of the PFM to allow or direct treatment of PFM spasms.    Baseline  Pt. is avoidant of all internal and external pelvic exam    Time  5    Period  Weeks    Status  Not Met    Target Date  01/16/18      PT SHORT TERM GOAL #3   Title  Patient will demonstrate appropriate body mechanics with bending and lifting heavy objects to allow for decreased stress on the pelvic floor and low back.     Time  5    Period  Weeks    Status  Achieved    Target Date  01/15/18      PT SHORT TERM GOAL #4   Title  Patient will perform self external TP release to decrease spasm and sensitivity of nerves to allow for ecreased pain.    Baseline  Pt. has reported trying this at home but seems to still be avoidant of touch.    Time  5    Period  Weeks    Status  Achieved    Target Date  07/30/18      PT SHORT TERM GOAL #5   Title  Patient will describe Max pain of no greater than 3/10 over prior week to demonstrate symptom improvement and  improved quality of life.    Baseline  As on 12/9: max pain of 5/10    Time  5    Period  Weeks    Status  On-going    Target Date  02/07/19        PT Long Term Goals - 12/14/18 1057  PT LONG TERM GOAL #1   Title  Patient will score at or below 8/43 on the Female NIH-CPSI and 5 on the VQ to demonstrate a clinically meaningful decrease in disability and distress due to pelvic floor dysfunction.    Baseline  NIH- CPSI: 18/43, VQ: 10/33 As of 12/9: NIH- CPSI: 12/43, VQ: 15/33    Time  10    Period  Weeks    Status  On-going      PT LONG TERM GOAL #2   Title  Patient will demonstrate ability to use a tool for self TP release to allow for maintenence of spasm release upon D/C.    Time  10    Period  Weeks    Status  On-going      PT LONG TERM GOAL #3   Title  Patient will report a reduction in pain to no greater than 2/10 over the prior week to demonstrate symptom improvement.    Baseline  As of 12/9: Max of 5/10 over past 2 weeks    Time  10    Period  Weeks    Status  On-going      PT LONG TERM GOAL #4   Title  Patient will demonstrate improved sitting and standing posture to demonstrate learning and decrease stress on the pelvic floor with functional activity.    Baseline  Pt. demonstrates improved posture ~ 85% of the time.    Time  10    Period  Weeks    Status  Achieved      PT LONG TERM GOAL #5   Title  Patient will perform regular exercise over the prior 2 weeks to demonstrate improved QOL and ability to maintain symptom improvement upon D/C    Time  10    Period  Weeks    Status  Achieved            Plan - 12/14/18 1052    Clinical Impression Statement  Pt. Has finally puchased and started using tool for internal release and has improved in her compliance with exercises enough to allow for nearly full improvement of pelvic alignment. Now that R innominate rotation is corrected, we were able to determine that she has a leg-length defecit with the RLE ~38m  longer than L, which is likely, in combination with her hypermobility, the underlying driving force behind her recurrent spasms and persistent pain. She responded well to addition of a heel-lift at today's session and education on how to make the most of her HEP including following-up about attaining vaginal valium prescription to decrease sensitivity and allow for further relaxation of the PFM to break pain/spasm cycle. Continue with POC, emphasizing pain-science interventions and scheduling to improve consistent compliance with her HEP for strengthening and focus more on core stability in-session.    Clinical Presentation  Stable    Clinical Decision Making  High    Rehab Potential  Fair    Clinical Impairments Affecting Rehab Potential  Deaf, multiple co-morbidities, fibromyalgia, fear of direct pelvic exam and treatment.    PT Frequency  Biweekly    PT Duration  Other (comment)   10 weeks   PT Treatment/Interventions  ADLs/Self Care Home Management;Aquatic Therapy;Biofeedback;Moist Heat;Traction;Ultrasound;Balance training;Gait training;Functional mobility training;Therapeutic activities;Therapeutic exercise;Neuromuscular re-education;Patient/family education;Orthotic Fit/Training;Scar mobilization;Manual techniques;Manual lymph drainage;Dry needling;Taping    PT Next Visit Plan  Ask about self-internal release success and answer questions. re-assess pelvic alignment and D/C MET exercise if aligned, replace with tall-kneeling/half kneeling stability exercises. Increase strengthening  exercises.    PT Home Exercise Plan  diaphragmatic breathing, TA in quadruped, child's pose, adductor stretch, piriformis stretch, squatty potty, L side stretch, hip abduction, tall posture walking.  Bird-dog, multi-stepping, mini-marches, prone press-ups, TP release with lacrosse ball., Heels squeeze in Prone, thoracic extensions over towel roll, self active MET in half-kneel. L heel-lift, hip IR exercise and B inversion  strengthening    Recommended Other Services  Vaginal valium    Consulted and Agree with Plan of Care  Patient       Patient will benefit from skilled therapeutic intervention in order to improve the following deficits and impairments:  Dizziness, Increased fascial restricitons, Impaired sensation, Improper body mechanics, Pain, Decreased coordination, Hypermobility, Postural dysfunction, Increased muscle spasms, Impaired tone, Decreased balance, Difficulty walking  Visit Diagnosis: Other muscle spasm  Abnormal posture  Muscular imbalance  Myalgia     Problem List Patient Active Problem List   Diagnosis Date Noted  . 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 OSA (obstructive sleep apnea) 10/10/2016  . Other insomnia 10/10/2016  . 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 Sleep disturbance 05/10/2016  . Chronic migraine without aura 03/29/2016  . Costochondral chest pain 12/28/2015  . Pain in the chest 06/09/2015  . Palpitations 06/09/2015  . Deaf 05/16/2015  . B12 deficiency 05/16/2015  . Cochlear implant status 05/16/2015  . Fatty infiltration of liver 05/16/2015  . Fibromyalgia syndrome 05/16/2015  . Gastro-esophageal reflux disease without esophagitis 05/16/2015  . Generalized headache 05/16/2015  . Right-sided Bell's palsy 05/16/2015  . H/O: depression 05/16/2015  . Hypertriglyceridemia 05/16/2015  . Blood glucose elevated 05/16/2015  . Irregular menstrual cycle 05/16/2015  . Overweight 05/16/2015  . Paresthesia 05/16/2015  . Disorder of labyrinth 05/16/2015  . Dyspnea 10/07/2014  . Moderate persistent intrinsic asthma without status asthmaticus without complication [id number]/69/4854   Willa Rough DPT, ATC Willa Rough 12/14/2018, 11:04 AM  [place] [facility] SERVICES [address] [place], [place], [address] Phone: [phone]   Fax:  [phone]  Name: Yesenia Wood MRN: [id number] Date of Birth: 16-Dec-1985

## 2018-12-12 NOTE — Telephone Encounter (Signed)
The patient wants her valium sent to the pharmacy here at [facility] instead of where it was sent.  She has a new appiontment with Dr. Valentino Saxon on the 28th, please advise, thanks.

## 2018-12-13 ENCOUNTER — Ambulatory Visit (INDEPENDENT_AMBULATORY_CARE_PROVIDER_SITE_OTHER): Payer: Medicare Other | Admitting: Psychiatry

## 2018-12-13 ENCOUNTER — Encounter: Payer: Self-pay | Admitting: Psychiatry

## 2018-12-13 ENCOUNTER — Other Ambulatory Visit: Payer: Self-pay

## 2018-12-13 VITALS — BP 117/79 | HR 102 | Temp 99.3°F | Wt 155.8 lb

## 2018-12-13 DIAGNOSIS — F4010 Social phobia, unspecified: Secondary | ICD-10-CM

## 2018-12-13 DIAGNOSIS — F331 Major depressive disorder, recurrent, moderate: Secondary | ICD-10-CM

## 2018-12-13 DIAGNOSIS — F5105 Insomnia due to other mental disorder: Secondary | ICD-10-CM

## 2018-12-13 MED ORDER — ZALEPLON 10 MG PO CAPS: 10.0000 mg | ORAL_CAPSULE | Freq: Every evening | ORAL | 0 refills | Status: DC | PRN

## 2018-12-13 NOTE — Telephone Encounter (Signed)
Replied to message via mychart.

## 2018-12-13 NOTE — Progress Notes (Signed)
BH MD OP Progress Note  12/13/2018 12:00 PM ARLEATHA WINCH  MRN:  [id number]  Chief Complaint: ' I am here for follow up.' Chief Complaint    Follow-up; Medication Refill     HPI: Jeanella is a 33 year old Caucasian female, unemployed, single, lives in [place], has a history of depression, anxiety, insomnia, migraine headaches, vitamin B12 deficiency, bilateral hearing loss, tachycardia, mute, presented to the clinic today for a follow-up visit.  Evaluation was done with the assistance of ASL interpreter - Carly , today.    Patient today reports she does not like the effect of Wellbutrin.  She reports she has noticed some sweating at night and she wakes up feeling soggy.  Patient reports she does not like that feeling.  She continues to feel depressed, low energy, lack of motivation.  Patient reports her sleep continues to be restless.  The Sonata 5 mg a be helping for a few hours.  She had a sleep study done previously which showed restless leg syndrome.  She reports she uses relaxation techniques to cope with it.  Discussed with her about readjusting her son at her dosage today.  Patient continues to take the Zoloft reduced dosage of 100 mg daily.  She denies any suicidality, perceptual disturbances or homicidality.  Patient reports she would like to get the genetic testing done today and hence will order the same.  Patient will return to clinic after the report comes back for more medication changes.      Visit Diagnosis:    ICD-10-CM   1. MDD (major depressive disorder), recurrent episode, moderate (HCC) F33.1   2. Social anxiety disorder F40.10   3. Insomnia due to mental condition F51.05     Past Psychiatric History: Reviewed past psychiatric history from my progress note on 09/10/2018.  Past trials of Zoloft, Cymbalta, Pamelor, gabapentin, Abilify, Belsomra, hydroxyzine, trazodone, Ambien, Remeron.     Past Medical History:  Past Medical History:  Diagnosis Date  . ADHD  (attention deficit hyperactivity disorder)   . B12 deficiency   . Bell's palsy    right sided  . Chronic migraine   . Deaf   . Depression   . 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 Fatigue   . Fatty liver   . Fibromyalgia   . GERD (gastroesophageal reflux disease)   . Headache   . High triglycerides   . Hyperglycemia   . Hypertension   . Hyperthyroidism   . Moderate asthma   . Obesity   . Paresthesia   . Premature birth     Past Surgical History:  Procedure Laterality Date  . BARTHOLIN GLAND CYST REMOVAL    . COCHLEAR IMPLANT    . COCHLEAR IMPLANT REMOVAL    . LAPAROSCOPIC TUBAL LIGATION Bilateral 03/26/2018   Procedure: LAPAROSCOPIC TUBAL LIGATION;  Surgeon: Hildred Laser, MD;  Location: [facility] ORS;  Service: Gynecology;  Laterality: Bilateral;  with peritoneal biopsies  . LAPAROSCOPY    . MYRINGOTOMY WITH TUBE PLACEMENT    . TUBAL LIGATION  03/26/2018    Family Psychiatric History: Reviewed family psychiatric history from my progress note on 09/10/2018.  Family History:  Family History  Adopted: Yes  Family history unknown: Yes    Social History: Reviewed social history from my progress note on 09/10/2018. Social History   Socioeconomic History  . Marital status: Single    Spouse name: Not on file  . Number of children: 0  .  Years of education: Not on file  . Highest education level: Some college, no degree  Occupational History  . Occupation: disability for hearing loss  Social Needs  . Financial resource strain: Somewhat hard  . Food insecurity:    Worry: Never true    Inability: Never true  . Transportation needs:    Medical: No    Non-medical: No  Tobacco Use  . Smoking status: Former Smoker    Packs/day: 1.00    Years: 2.00    Pack years: 2.00    Types: Cigarettes    Start date: 11/28/2004    Last attempt to quit: 11/28/2006    Years since quitting: 12.0  . Smokeless tobacco: Never Used   Substance and Sexual Activity  . Alcohol use: Not Currently    Alcohol/week: 0.0 standard drinks    Frequency: Never    Comment: occass  . Drug use: No  . Sexual activity: Not Currently    Birth control/protection: Pill    Comment: Seasonale  Lifestyle  . Physical activity:    Days per week: 7 days    Minutes per session: 150+ min  . Stress: To some extent  Relationships  . Social connections:    Talks on phone: Once a week    Gets together: Never    Attends religious service: Never    Active member of club or organization: No    Attends meetings of clubs or organizations: Never    Relationship status: Never married  Other Topics Concern  . Not on file  Social History Narrative   Lives with her adopted  parents   Hearing loss since birth     Allergies:  Allergies  Allergen Reactions  . Budesonide-Formoterol Fumarate Anaphylaxis and Other (See Comments)    Other reaction(s): Other (See Comments) chest pain Chest pain chest pain  . Astelin [Azelastine]     Tingling, numbness, nausea  . Gabapentin Nausea And Vomiting  . Nortriptyline Other (See Comments)    Other reaction(s): Other (See Comments)  . Diclofenac Other (See Comments)    Other reaction(s): Other (See Comments)  . Duloxetine Hcl Other (See Comments)    Other reaction(s): Other (See Comments)  . Montelukast Sodium Other (See Comments)  . Mupirocin Other (See Comments)  . Naproxen Other (See Comments) and Rash  . Naproxen Sodium Rash    mild rash  . Oxycodone Hcl Rash  . Sulfa Antibiotics Hives and Rash    Metabolic Disorder Labs: Lab Results  Component Value Date   HGBA1C 4.9 01/29/2018   MPG 94 01/29/2018   MPG 97 10/10/2016   No results found for: PROLACTIN Lab Results  Component Value Date   CHOL 131 09/26/2018   TRIG 87 09/26/2018   HDL 45 (L) 09/26/2018   CHOLHDL 2.9 09/26/2018   VLDL 20 10/10/2016   LDLCALC 69 09/26/2018   LDLCALC 195 (H) 06/22/2018   Lab Results  Component  Value Date   TSH 2.84 01/29/2018   TSH 1.649 03/02/2016    Therapeutic Level Labs: No results found for: LITHIUM No results found for: VALPROATE No components found for:  CBMZ  Current Medications: Current Outpatient Medications  Medication Sig Dispense Refill  . acetaminophen (TYLENOL) 500 MG tablet Take 1,000 mg by mouth every 8 (eight) hours as needed for headache.    . benzonatate (TESSALON) 100 MG capsule Take 1-2 capsules (100-200 mg total) by mouth 2 (two) times daily as needed. 40 capsule 0  . dicyclomine (BENTYL) 20 MG  tablet TAKE 1 TABLET BY MOUTH TWICE A DAY AS NEEDED FOR SPASMS 180 tablet 1  . diphenhydrAMINE (BENADRYL) 50 MG tablet Take 50 mg by mouth at bedtime as needed for itching.    . Elagolix Sodium (ORILISSA) 150 MG TABS Take 1 tablet by mouth daily. 30 tablet 0  . neomycin-bacitracin-polymyxin (NEOSPORIN) OINT Apply 1 application topically 2 (two) times daily. 14 g 0  . norgestimate-ethinyl estradiol (ORTHO-CYCLEN,SPRINTEC,PREVIFEM) 0.25-35 MG-MCG tablet Take 1 tablet by mouth daily. 1 Package 11  . rosuvastatin (CRESTOR) 20 MG tablet TAKE 1 TABLET BY MOUTH EVERY DAY 90 tablet 1  . sertraline (ZOLOFT) 100 MG tablet Take 1 tablet (100 mg total) by mouth daily. 135 tablet 0  . diltiazem (CARDIZEM CD) 120 MG 24 hr capsule Take 1 capsule (120 mg total) by mouth daily. 90 capsule 3  . metoprolol succinate (TOPROL-XL) 25 MG 24 hr tablet Take 1 tablet (25 mg total) by mouth daily. Take with or immediately following a meal. 90 tablet 3  . zaleplon (SONATA) 10 MG capsule Take 1 capsule (10 mg total) by mouth at bedtime as needed for sleep. 30 capsule 0   No current facility-administered medications for this visit.      Musculoskeletal: Strength & Muscle Tone: within normal limits Gait & Station: normal Patient leans: N/A  Psychiatric Specialty Exam: Review of Systems  Psychiatric/Behavioral: Positive for depression. The patient is nervous/anxious and has insomnia.    All other systems reviewed and are negative.   Blood pressure 117/79, pulse (!) 102, temperature 99.3 F (37.4 C), temperature source Oral, weight 155 lb 12.8 oz (70.7 kg).Body mass index is 27.6 kg/m.  General Appearance: Casual  Eye Contact:  Fair  Speech:  Clear and Coherent  Volume:  Normal  Mood:  Dysphoric  Affect:  Congruent  Thought Process:  Goal Directed and Descriptions of Associations: Intact  Orientation:  Full (Time, Place, and Person)  Thought Content: Logical   Suicidal Thoughts:  No  Homicidal Thoughts:  No  Memory:  Immediate;   Fair Recent;   Fair Remote;   Fair  Judgement:  Fair  Insight:  Fair  Psychomotor Activity:  Normal  Concentration:  Concentration: Fair and Attention Span: Fair  Recall:  [employer] of Knowledge: Fair  Language: Fair  Akathisia:  No  Handed:  Right  AIMS (if indicated): denies tremors, rigidity,stiffness  Assets:  Communication Skills Desire for Improvement Social Support  ADL's:  Intact  Cognition: WNL  Sleep:  Poor   Screenings: GAD-7     Office Visit from 09/25/2018 in [facility] Office Visit from 06/22/2018 in [facility] Office Visit from 03/19/2018 in [facility] Office Visit from 01/29/2018 in [facility]  Total GAD-7 Score  14  7  12  7     PHQ2-9     Office Visit from 09/25/2018 in [facility] Office Visit from 06/22/2018 in [facility] Office Visit from 03/19/2018 in [facility] Office Visit from 01/29/2018 in [facility] Office Visit from 04/11/2017 in [facility]  PHQ-2 Total Score  3  2  2  2  1   PHQ-9 Total Score  17  12  8  9   -       Assessment and Plan: Aryelle is a 33 year old Caucasian female who is single, unemployed, lives in [place], has a history of depression, anxiety, multiple medical problems, is deaf and  mute, presented to the  clinic today for a follow-up visit.  Patient continues to struggle with depression and sleep problems.  She is biologically predisposed given her history of trauma.  She also has psychosocial stressors of multiple health problems.  We will continue plan as noted below.  Patient is deaf and mute and hence ASL interpreter Carli assisted in evaluation today  Plan For MDD- unstable Continue reduced dosage of Zoloft 100 mg p.o. daily. Discontinue Wellbutrin for side effects. Patient reports she does not want any medication changes today and would like GeneSight testing ordered.  For social anxiety disorder-unstable Zoloft as prescribed.  She will continue psychotherapy.  Insomnia- unstable Increase Sonata to 10 mg p.o. nightly Also discussed adding melatonin 3 mg if Sonata by itself does not help.  Also discussed with patient to work on her sleep hygiene.  She will continue psychotherapy sessions.  We will order GeneSight testing today.  Follow-up in clinic in 2 to 3 weeks or sooner if needed  I have spent atleast 25 minutes face to face with patient today. More than 50 % of the time was spent for psychoeducation and supportive psychotherapy and care coordination.  This note was generated in part or whole with voice recognition software. Voice recognition is usually quite accurate but there are transcription errors that can and very often do occur. I apologize for any typographical errors that were not detected and corrected.          Jomarie LongsSaramma Maahir Horst, MD 12/13/2018, 12:00 PM

## 2018-12-14 ENCOUNTER — Encounter: Payer: Self-pay | Admitting: Obstetrics and Gynecology

## 2018-12-17 ENCOUNTER — Other Ambulatory Visit: Payer: Self-pay | Admitting: Obstetrics and Gynecology

## 2018-12-17 MED ORDER — DIAZEPAM 10 MG PO TABS: ORAL_TABLET | ORAL | 3 refills | Status: DC

## 2018-12-23 ENCOUNTER — Other Ambulatory Visit: Payer: Self-pay | Admitting: Psychiatry

## 2018-12-23 DIAGNOSIS — F4010 Social phobia, unspecified: Secondary | ICD-10-CM

## 2018-12-23 DIAGNOSIS — F331 Major depressive disorder, recurrent, moderate: Secondary | ICD-10-CM

## 2018-12-25 ENCOUNTER — Ambulatory Visit (INDEPENDENT_AMBULATORY_CARE_PROVIDER_SITE_OTHER): Payer: Medicare Other | Admitting: Obstetrics and Gynecology

## 2018-12-25 ENCOUNTER — Encounter: Payer: Self-pay | Admitting: Obstetrics and Gynecology

## 2018-12-25 VITALS — BP 92/57 | HR 83 | Ht 63.0 in | Wt 154.3 lb

## 2018-12-25 DIAGNOSIS — N921 Excessive and frequent menstruation with irregular cycle: Secondary | ICD-10-CM | POA: Diagnosis not present

## 2018-12-25 DIAGNOSIS — N946 Dysmenorrhea, unspecified: Secondary | ICD-10-CM

## 2018-12-25 DIAGNOSIS — N938 Other specified abnormal uterine and vaginal bleeding: Secondary | ICD-10-CM

## 2018-12-25 DIAGNOSIS — M6289 Other specified disorders of muscle: Secondary | ICD-10-CM | POA: Diagnosis not present

## 2018-12-25 MED ORDER — NORETHINDRONE ACETATE 5 MG PO TABS: 5.0000 mg | ORAL_TABLET | Freq: Every day | ORAL | 2 refills | Status: DC

## 2018-12-25 NOTE — Progress Notes (Signed)
Pt is present today for a follow up from endometriosis. Pt stated that she is doing better.

## 2018-12-25 NOTE — Progress Notes (Signed)
    GYNECOLOGY PROGRESS NOTE  Subjective:    Patient ID: Yesenia Wood, female    DOB: [birth year], 33 y.o.   MRN: [id number]  HPI  Patient is a 33 y.o. G0P0000 female who presents for continued complaints of dysfunctional uterine bleeding and breakthrough bleeding on OCPs. Patient notes that since resuming her birth control pills, she is having approximately 2 cycles a month.  She reports a cycle Dec 9-16, then again  Dec 29- Jan 8.  She has just started her cycle again today.   She also notes that her prescription for her diazepam (vaginal) for her pelvic floor dysfunction was not available at her pharmacy as requested last week.   The following portions of the patient's history were reviewed and updated as appropriate: allergies, current medications, past family history, past medical history, past social history, past surgical history and problem list.  Review of Systems Pertinent items noted in HPI and remainder of comprehensive ROS otherwise negative.   Objective:   Blood pressure (!) 92/57, pulse 83, height 5\' 3"  (1.6 m), weight 154 lb 4.8 oz (70 kg). General appearance: alert and no distress  Remainder of exam deferred.   Assessment:   DUB Breakthrough bleeding on OCPs.  Dymenorrhea Pelvic floor dysfunction  Plan:   - Patient has tried several birth control pill types, and also tried Mirena IUD in the past.  Discussed other methods of contraception, including Nuvaring, or patch;, hormonal suppression with Aygestin; or consider surgical options such as endometrial ablation or hysterectomy. Patient desires to try suppression with aygestin. To discontinue OCPs. - Continue NSAIDS/Tylenol for dysmenorrhea - Informed patient that her diazepam had indeed been sent to her pharmacy last week and to contact them. If any problems persist will resend prescription. To continue with pelvic floor physical therapy as she notes that it is helping with pelvic and back issues.  - Follow up in  4-6 weeks for reassessment of symptoms.   Hildred Laser, MD [facility]

## 2018-12-26 ENCOUNTER — Ambulatory Visit: Payer: Medicare Other

## 2018-12-26 DIAGNOSIS — R293 Abnormal posture: Secondary | ICD-10-CM | POA: Diagnosis not present

## 2018-12-26 DIAGNOSIS — M791 Myalgia, unspecified site: Secondary | ICD-10-CM | POA: Diagnosis not present

## 2018-12-26 DIAGNOSIS — M62838 Other muscle spasm: Secondary | ICD-10-CM

## 2018-12-26 DIAGNOSIS — M629 Disorder of muscle, unspecified: Secondary | ICD-10-CM

## 2018-12-26 DIAGNOSIS — M6289 Other specified disorders of muscle: Secondary | ICD-10-CM

## 2018-12-26 NOTE — Therapy (Signed)
[place] MAIN [facility] SERVICES [address] [place], [place], [address] Phone: [phone]   Fax:  [phone]  Physical Therapy Treatment  Patient Details  Name: Yesenia Wood MRN: [id number] Date of Birth: [birth year] No data recorded  Encounter Date: 12/26/2018  PT End of Session - 12/27/18 1630    Visit Number  21    Number of Visits  24    Date for PT Re-Evaluation  02/07/19    Authorization Type  Medicare    Authorization - Visit Number  1    Authorization - Number of Visits  5    PT Start Time  1000    PT Stop Time  1100    PT Time Calculation (min)  60 min    Activity Tolerance  Patient tolerated treatment well    Behavior During Therapy  [facility] for tasks assessed/performed       Past Medical History:  Diagnosis Date  . ADHD (attention deficit hyperactivity disorder)   . B12 deficiency   . Bell's palsy    right sided  . Chronic migraine   . Deaf   . Depression   . 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 Fatigue   . Fatty liver   . Fibromyalgia   . GERD (gastroesophageal reflux disease)   . Headache   . High triglycerides   . Hyperglycemia   . Hypertension   . Hyperthyroidism   . Moderate asthma   . Obesity   . Paresthesia   . Premature birth     Past Surgical History:  Procedure Laterality Date  . BARTHOLIN GLAND CYST REMOVAL    . COCHLEAR IMPLANT    . COCHLEAR IMPLANT REMOVAL    . LAPAROSCOPIC TUBAL LIGATION Bilateral 03/26/2018   Procedure: LAPAROSCOPIC TUBAL LIGATION;  Surgeon: Rubie Maid, MD;  Location: [facility] ORS;  Service: Gynecology;  Laterality: Bilateral;  with peritoneal biopsies  . LAPAROSCOPY    . MYRINGOTOMY WITH TUBE PLACEMENT    . TUBAL LIGATION  03/26/2018    There were no vitals filed for this visit.    Pelvic Floor Physical Therapy Treatment Note  SCREENING  Changes in medications, allergies, or medical history?: has finally gotten  her valium, is not on BC anymore    SUBJECTIVE  Patient reports: She just started the vaginal valium, is on her period and has not noticed a difference yet, has worked up to wearing heel-lift all day and it is helping her back pain. She is still doing exercises every 2-3 days and having increased pain following exercise.  Precautions:  Deaf, fibromyalgia  Pain update:  Location of pain: low back Current pain:  7/10  Max pain:  6/10 Least pain:  2/10 Nature of pain: tightness/ache  Patient Goals: Be able to walk comfortably and do activities around the house without pain.   OBJECTIVE  Changes in: Posture/Observations:  Very slight R anterior rotation remains.  Palpation: Only mild TTP to B iliacus (HUGE IMPROVEMENT!) TTP to B lumbar multifidus  Gait Analysis: Pt. Demonstrating greatly improved gait with addition of heel-lift.  INTERVENTIONS THIS SESSION: Manual: Performed TP release to B multifidus in the L4-S1 region to decrease spasm and pain in the low back and decrease pressure on sacral nerves coursing to the PFM.  Therex: Practiced tall kneeling squats and tall to half-kneel transitions to improve coordination and strength of pelvic stabilizers to decrease tension on low back and allow for  decreased pain.  Total time: 60 min.                          PT Education - 12/27/18 1629    Education provided  Yes    Education Details  See Interventions this session.    Person(s) Educated  Patient    Methods  Explanation;Demonstration;Tactile cues;Verbal cues    Comprehension  Verbalized understanding;Returned demonstration;Verbal cues required;Tactile cues required       PT Short Term Goals - 11/29/18 0856      PT SHORT TERM GOAL #1   Title  Patient will demonstrate improved pelvic allignment in standing and supine for improved pelvic position for PFM length/tension relationship.    Time  5    Period  Weeks    Status  Achieved    Target  Date  01/15/18      PT SHORT TERM GOAL #2   Title  Patient will undergo Internal/External assessment of the PFM to allow or direct treatment of PFM spasms.    Baseline  Pt. is avoidant of all internal and external pelvic exam    Time  5    Period  Weeks    Status  Not Met    Target Date  01/16/18      PT SHORT TERM GOAL #3   Title  Patient will demonstrate appropriate body mechanics with bending and lifting heavy objects to allow for decreased stress on the pelvic floor and low back.     Time  5    Period  Weeks    Status  Achieved    Target Date  01/15/18      PT SHORT TERM GOAL #4   Title  Patient will perform self external TP release to decrease spasm and sensitivity of nerves to allow for ecreased pain.    Baseline  Pt. has reported trying this at home but seems to still be avoidant of touch.    Time  5    Period  Weeks    Status  Achieved    Target Date  07/30/18      PT SHORT TERM GOAL #5   Title  Patient will describe Max pain of no greater than 3/10 over prior week to demonstrate symptom improvement and improved quality of life.    Baseline  As on 12/9: max pain of 5/10    Time  5    Period  Weeks    Status  On-going    Target Date  02/07/19        PT Long Term Goals - 12/14/18 1057      PT LONG TERM GOAL #1   Title  Patient will score at or below 8/43 on the Female NIH-CPSI and 5 on the VQ to demonstrate a clinically meaningful decrease in disability and distress due to pelvic floor dysfunction.    Baseline  NIH- CPSI: 18/43, VQ: 10/33 As of 12/9: NIH- CPSI: 12/43, VQ: 15/33    Time  10    Period  Weeks    Status  On-going      PT LONG TERM GOAL #2   Title  Patient will demonstrate ability to use a tool for self TP release to allow for maintenence of spasm release upon D/C.    Time  10    Period  Weeks    Status  On-going      PT LONG TERM GOAL #3   Title  Patient will report a reduction in pain to no greater than 2/10 over the prior week to demonstrate  symptom improvement.    Baseline  As of 12/9: Max of 5/10 over past 2 weeks    Time  10    Period  Weeks    Status  On-going      PT LONG TERM GOAL #4   Title  Patient will demonstrate improved sitting and standing posture to demonstrate learning and decrease stress on the pelvic floor with functional activity.    Baseline  Pt. demonstrates improved posture ~ 85% of the time.    Time  10    Period  Weeks    Status  Achieved      PT LONG TERM GOAL #5   Title  Patient will perform regular exercise over the prior 2 weeks to demonstrate improved QOL and ability to maintain symptom improvement upon D/C    Time  10    Period  Weeks    Status  Achieved            Plan - 12/27/18 1632    Clinical Impression Statement  Pt. has demonstrated improvement between sessions in her LBP with use of the heel-lift and demonstrates less hip-flexor spasm than previous sessions, indicating it is helping her not have recurrence in combination with her HEP exercises. she responded slowly but well to TP release demonstrating decreased spasm in low back and improved posture and gait. Continue per POC.    Clinical Presentation  Stable    Clinical Decision Making  High    Rehab Potential  Fair    Clinical Impairments Affecting Rehab Potential  Deaf, multiple co-morbidities, fibromyalgia, fear of direct pelvic exam and treatment.    PT Frequency  Biweekly    PT Duration  Other (comment)   10 weeks   PT Treatment/Interventions  ADLs/Self Care Home Management;Aquatic Therapy;Biofeedback;Moist Heat;Traction;Ultrasound;Balance training;Gait training;Functional mobility training;Therapeutic activities;Therapeutic exercise;Neuromuscular re-education;Patient/family education;Orthotic Fit/Training;Scar mobilization;Manual techniques;Manual lymph drainage;Dry needling;Taping    PT Next Visit Plan  Ask about self-internal release success and answer questions. re-assess pelvic alignment and D/C MET exercise if aligned,  review tall-kneeling/half kneeling stability exercises and add the/replace some of current HEP. Increase strengthening exercises as able witout pain increase.    PT Home Exercise Plan  diaphragmatic breathing, TA in quadruped, child's pose, adductor stretch, piriformis stretch, squatty potty, L side stretch, hip abduction, tall posture walking.  Bird-dog, multi-stepping, mini-marches, prone press-ups, TP release with lacrosse ball., Heels squeeze in Prone, thoracic extensions over towel roll, self active MET in half-kneel. L heel-lift, hip IR exercise and B inversion strengthening    Consulted and Agree with Plan of Care  Patient       Patient will benefit from skilled therapeutic intervention in order to improve the following deficits and impairments:  Dizziness, Increased fascial restricitons, Impaired sensation, Improper body mechanics, Pain, Decreased coordination, Hypermobility, Postural dysfunction, Increased muscle spasms, Impaired tone, Decreased balance, Difficulty walking  Visit Diagnosis: Other muscle spasm  Abnormal posture  Muscular imbalance  Myalgia     Problem List Patient Active Problem List   Diagnosis Date Noted  . 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 OSA (obstructive sleep apnea) 10/10/2016  . Other insomnia 10/10/2016  . 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 Sleep disturbance 05/10/2016  . Chronic migraine without aura 03/29/2016  . Costochondral chest pain 12/28/2015  . Pain in the chest 06/09/2015  . Palpitations 06/09/2015  . Deaf 05/16/2015  . B12 deficiency 05/16/2015  .  Cochlear implant status 05/16/2015  . Fatty infiltration of liver 05/16/2015  . Fibromyalgia syndrome 05/16/2015  . Gastro-esophageal reflux disease without esophagitis 05/16/2015  . Generalized headache 05/16/2015  . Right-sided Bell's palsy 05/16/2015  . H/O: depression 05/16/2015  . Hypertriglyceridemia 05/16/2015  . Blood glucose elevated 05/16/2015  . Irregular menstrual cycle  05/16/2015  . Overweight 05/16/2015  . Paresthesia 05/16/2015  . Disorder of labyrinth 05/16/2015  . Dyspnea 10/07/2014  . Moderate persistent intrinsic asthma without status asthmaticus without complication [id number]/[id number]   Willa Rough DPT, ATC Willa Rough 12/27/2018, 4:59 PM  [place] MAIN [facility] SERVICES [address] [place], [place], [address] Phone: [phone]   Fax:  [phone]  Name: Yesenia Wood MRN: [id number] Date of Birth: 05/25/1986

## 2019-01-03 ENCOUNTER — Encounter: Payer: Self-pay | Admitting: Licensed Clinical Social Worker

## 2019-01-03 ENCOUNTER — Ambulatory Visit: Payer: Medicare Other | Admitting: Psychiatry

## 2019-01-03 ENCOUNTER — Ambulatory Visit (INDEPENDENT_AMBULATORY_CARE_PROVIDER_SITE_OTHER): Payer: Medicare Other | Admitting: Licensed Clinical Social Worker

## 2019-01-03 DIAGNOSIS — F331 Major depressive disorder, recurrent, moderate: Secondary | ICD-10-CM

## 2019-01-03 NOTE — Progress Notes (Signed)
   THERAPIST PROGRESS NOTE  Session Time: 9163-8466  Participation Level: Active  Behavioral Response: NeatAlertDepressed  Type of Therapy: Individual Therapy  Treatment Goals addressed: Coping  Interventions: CBT  Summary: Yesenia Wood is a 33 y.o. female who presents with continued symptoms of her diagnosis. Yesenia Wood was joined by her interpreter for her session. She reports feeling frustrated as she missed her appointment with the MD due to her interpreter being late. Yesenia Wood reported she was very excited about her appointment with the doctor as she had completed genesite testing to ensure she was on the appropriate antidepressant medications. LCSW encouraged Yesenia Wood to reschedule her appointment ASAP, as that is very exciting news. Yesenia Wood reported her depression has maintained and not gotten worse. She is not sleeping well, though she reports that has been consistent since she was a baby. She states the hair pulling has not gotten better, though she added she got a fidget toy as we discussed which she states has decreased the impulse slightly. She stated she attempted to give herself a time limit of pulling which did not work. She also reported other compulsive behaviors have continued, such as checking the water is off at least five times. LCSW suggested taking photos to look at later to comfort her in the moment. She reports she has tried that in the past which did not help. LCSW suggested putting time between when she thinks the thought and when she performs the action, and then build on that confidence that everything is okay once she gets to the faucet. Yesenia Wood was in agreement with this idea. Yesenia Wood also reported feeling a lack of motivation which she believes has to do with her feeling "abnormal." LCSW encouraged Yesenia Wood to look at the idea of normal and recognize the false expectations she is setting for herself. Yesenia Wood expressed agreement with this plan as well. Yesenia Wood added she has noticed  decreased motivation in attempting to reach small goals like completing a crochet project. LCSW suggested Yesenia Wood break that task into smaller increments, ie instead of "I'm going to finish this all in one setting," attempt to crochet for ten minutes in order to build momentum. Yesenia Wood expressed agreement with this plan and stated she would try.   Suicidal/Homicidal: No  Therapist Response: Yesenia Wood was able to speak openly about her emotional regulation and depression symptoms. We will continue to utilize CBT to manage symptoms, although at today's session we discussed the possibility of beginning EMDR to address past trauma. Yesenia Wood will let LCSW know at our next session if she is interested in EMDR treatment.   Plan: Return again in 4 weeks.  Diagnosis: Axis I: MDD (recurrent, moderate)    Axis II: No diagnosis    [name], LCSW 01/03/2019

## 2019-01-09 ENCOUNTER — Ambulatory Visit (INDEPENDENT_AMBULATORY_CARE_PROVIDER_SITE_OTHER): Payer: Medicare Other

## 2019-01-09 ENCOUNTER — Ambulatory Visit: Payer: Medicare Other | Attending: Obstetrics and Gynecology

## 2019-01-09 DIAGNOSIS — M62838 Other muscle spasm: Secondary | ICD-10-CM | POA: Insufficient documentation

## 2019-01-09 DIAGNOSIS — M791 Myalgia, unspecified site: Secondary | ICD-10-CM

## 2019-01-09 DIAGNOSIS — E538 Deficiency of other specified B group vitamins: Secondary | ICD-10-CM | POA: Diagnosis not present

## 2019-01-09 DIAGNOSIS — R293 Abnormal posture: Secondary | ICD-10-CM

## 2019-01-09 DIAGNOSIS — M629 Disorder of muscle, unspecified: Secondary | ICD-10-CM | POA: Diagnosis not present

## 2019-01-09 DIAGNOSIS — M6289 Other specified disorders of muscle: Secondary | ICD-10-CM

## 2019-01-09 MED ORDER — CYANOCOBALAMIN 1000 MCG/ML IJ SOLN
1000.0000 ug | Freq: Once | INTRAMUSCULAR | Status: AC
  Administered 2019-01-09: 1000 ug via INTRAMUSCULAR

## 2019-01-09 NOTE — Progress Notes (Signed)
Patient came in for her B-12 injection. She tolerated it well. NKDA. She had B-12 labs drawn before injection. Please see results from 01/09/2019.  Lab Results  Component Value Date   VITAMINB12 464 09/26/2018

## 2019-01-09 NOTE — Therapy (Signed)
[place] MAIN [facility] SERVICES [address] [place], [place], [address] Phone: [phone]   Fax:  [phone]  Physical Therapy Treatment  Patient Details  Name: Yesenia Wood MRN: [id number] Date of Birth: 1986-03-24 No data recorded  Encounter Date: 01/09/2019  PT End of Session - 01/09/19 1634    Visit Number  22    Number of Visits  24    Date for PT Re-Evaluation  02/07/19    Authorization Type  Medicare    Authorization - Visit Number  2    Authorization - Number of Visits  5    PT Start Time  1008    PT Stop Time  1114    PT Time Calculation (min)  66 min    Activity Tolerance  Patient tolerated treatment well    Behavior During Therapy  [facility] for tasks assessed/performed       Past Medical History:  Diagnosis Date  . ADHD (attention deficit hyperactivity disorder)   . B12 deficiency   . Bell's palsy    right sided  . Chronic migraine   . Deaf   . Depression   . 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 Fatigue   . Fatty liver   . Fibromyalgia   . GERD (gastroesophageal reflux disease)   . Headache   . High triglycerides   . Hyperglycemia   . Hypertension   . Hyperthyroidism   . Moderate asthma   . Obesity   . Paresthesia   . Premature birth     Past Surgical History:  Procedure Laterality Date  . BARTHOLIN GLAND CYST REMOVAL    . COCHLEAR IMPLANT    . COCHLEAR IMPLANT REMOVAL    . LAPAROSCOPIC TUBAL LIGATION Bilateral 03/26/2018   Procedure: LAPAROSCOPIC TUBAL LIGATION;  Surgeon: Rubie Maid, MD;  Location: [facility] ORS;  Service: Gynecology;  Laterality: Bilateral;  with peritoneal biopsies  . LAPAROSCOPY    . MYRINGOTOMY WITH TUBE PLACEMENT    . TUBAL LIGATION  03/26/2018    There were no vitals filed for this visit.    Pelvic Floor Physical Therapy Treatment Note  SCREENING  Changes in medications, allergies, or medical history?:  none    SUBJECTIVE  Patient reports: Does not like aygestin medication, feels that her appetite decreases and her weight increases on it. Does not feel that she can tell a big difference with the addition of the vaginal valium. Has hurt her back ~ 5 days ago, thinks she pulled a muscle but cannot remember what she did to irritate it.   Precautions:  Deaf, fibromyalgia, leg-length difference.  Pain update:  Location of pain: waistline in the back, B Current pain:  4/10  Max pain:  7/10 Least pain:  4/10 Nature of pain: throbbing  Patient Goals: Be able to walk comfortably and do activities around the house without pain.   OBJECTIVE  Changes in: Posture/Observations:  Pelvis is level in standing.  Abdominal:  Difficulty recruiting deep-core and obliques to stabilize core and allow PFM to relax.  Palpation: TTP to B multifidus near T-L junction    INTERVENTIONS THIS SESSION: Manual: Performed TP release to B multifidus near T-L junction to decrease pain and spasm and allow for decreased pressure on nerve roots so Pt. Can gain improved recruitment of her obliques and TA for core stability. Therex: Educated on how to perform chest-press against wall with band to recruit obliques and TA  and decrease hyperlordosis/stress on T/L junction that leads to increased pain.  Self-care: reviewed importance of performing HEP daily and how to perform internal TP release appropriately every 2-3 days, spending more than the 1 min. On a given TP as the patient was doing.  Total time: 65 min.                          PT Education - 01/09/19 1633    Education provided  Yes    Education Details  See Pt. Instructions and Interventions this session.    Person(s) Educated  Patient    Methods  Explanation;Demonstration;Verbal cues;Tactile cues;Handout    Comprehension  Verbalized understanding;Returned demonstration;Verbal cues required;Tactile cues required       PT  Short Term Goals - 11/29/18 0856      PT SHORT TERM GOAL #1   Title  Patient will demonstrate improved pelvic allignment in standing and supine for improved pelvic position for PFM length/tension relationship.    Time  5    Period  Weeks    Status  Achieved    Target Date  01/15/18      PT SHORT TERM GOAL #2   Title  Patient will undergo Internal/External assessment of the PFM to allow or direct treatment of PFM spasms.    Baseline  Pt. is avoidant of all internal and external pelvic exam    Time  5    Period  Weeks    Status  Not Met    Target Date  01/16/18      PT SHORT TERM GOAL #3   Title  Patient will demonstrate appropriate body mechanics with bending and lifting heavy objects to allow for decreased stress on the pelvic floor and low back.     Time  5    Period  Weeks    Status  Achieved    Target Date  01/15/18      PT SHORT TERM GOAL #4   Title  Patient will perform self external TP release to decrease spasm and sensitivity of nerves to allow for ecreased pain.    Baseline  Pt. has reported trying this at home but seems to still be avoidant of touch.    Time  5    Period  Weeks    Status  Achieved    Target Date  07/30/18      PT SHORT TERM GOAL #5   Title  Patient will describe Max pain of no greater than 3/10 over prior week to demonstrate symptom improvement and improved quality of life.    Baseline  As on 12/9: max pain of 5/10    Time  5    Period  Weeks    Status  On-going    Target Date  02/07/19        PT Long Term Goals - 12/14/18 1057      PT LONG TERM GOAL #1   Title  Patient will score at or below 8/43 on the Female NIH-CPSI and 5 on the VQ to demonstrate a clinically meaningful decrease in disability and distress due to pelvic floor dysfunction.    Baseline  NIH- CPSI: 18/43, VQ: 10/33 As of 12/9: NIH- CPSI: 12/43, VQ: 15/33    Time  10    Period  Weeks    Status  On-going      PT LONG TERM GOAL #2   Title  Patient will demonstrate ability to  use  a tool for self TP release to allow for maintenence of spasm release upon D/C.    Time  10    Period  Weeks    Status  On-going      PT LONG TERM GOAL #3   Title  Patient will report a reduction in pain to no greater than 2/10 over the prior week to demonstrate symptom improvement.    Baseline  As of 12/9: Max of 5/10 over past 2 weeks    Time  10    Period  Weeks    Status  On-going      PT LONG TERM GOAL #4   Title  Patient will demonstrate improved sitting and standing posture to demonstrate learning and decrease stress on the pelvic floor with functional activity.    Baseline  Pt. demonstrates improved posture ~ 85% of the time.    Time  10    Period  Weeks    Status  Achieved      PT LONG TERM GOAL #5   Title  Patient will perform regular exercise over the prior 2 weeks to demonstrate improved QOL and ability to maintain symptom improvement upon D/C    Time  10    Period  Weeks    Status  Achieved            Plan - 01/09/19 1634    Clinical Impression Statement  Pt. demonstrates good pelvic alignment but continues to be hyperlordotic and have minimal core stability, leading to pressure on spinal nerve roots and perpetuation of chronic pain. She has not yet noticed a change in her ability to tolerate internal TP release with addition of vaginal valium but has also not been performing TP release as instructed so results are unclear at this time. She will benefit from further strengthening into good posture for decreased spasms and pain and to allow for further relaxation of the PFM. Continue per POC.    Clinical Presentation  Stable    Clinical Decision Making  High    Clinical Impairments Affecting Rehab Potential  Deaf, multiple co-morbidities, fibromyalgia, fear of direct pelvic exam and treatment.    PT Frequency  Biweekly    PT Duration  Other (comment)   10 weeks   PT Treatment/Interventions  ADLs/Self Care Home Management;Aquatic Therapy;Biofeedback;Moist  Heat;Traction;Ultrasound;Balance training;Gait training;Functional mobility training;Therapeutic activities;Therapeutic exercise;Neuromuscular re-education;Patient/family education;Orthotic Fit/Training;Scar mobilization;Manual techniques;Manual lymph drainage;Dry needling;Taping    PT Next Visit Plan  Ask about self-internal release success and answer questions PRN. review tall-kneeling/half kneeling stability exercises and chest-press leaning against wall for rib-flare, add them/replace some of current HEP for appopriate challenge. Increase strengthening exercises as able witout pain increase.    PT Home Exercise Plan  diaphragmatic breathing, TA in quadruped, child's pose, adductor stretch, piriformis stretch, squatty potty, L side stretch, hip abduction, tall posture walking.  Bird-dog, multi-stepping, mini-marches, prone press-ups, TP release with lacrosse ball., Heels squeeze in Prone, thoracic extensions over towel roll, self active MET in half-kneel. L heel-lift, hip IR exercise and B inversion strengthening    Consulted and Agree with Plan of Care  Patient       Patient will benefit from skilled therapeutic intervention in order to improve the following deficits and impairments:  Dizziness, Increased fascial restricitons, Impaired sensation, Improper body mechanics, Pain, Decreased coordination, Hypermobility, Postural dysfunction, Increased muscle spasms, Impaired tone, Decreased balance, Difficulty walking  Visit Diagnosis: Other muscle spasm  Abnormal posture  Muscular imbalance  Myalgia     Problem  List Patient Active Problem List   Diagnosis Date Noted  . 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 OSA (obstructive sleep apnea) 10/10/2016  . Other insomnia 10/10/2016  . 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 Sleep disturbance 05/10/2016  . Chronic migraine without aura 03/29/2016  . Costochondral chest pain 12/28/2015  . Pain in the chest 06/09/2015  . Palpitations 06/09/2015  .  Deaf 05/16/2015  . B12 deficiency 05/16/2015  . Cochlear implant status 05/16/2015  . Fatty infiltration of liver 05/16/2015  . Fibromyalgia syndrome 05/16/2015  . Gastro-esophageal reflux disease without esophagitis 05/16/2015  . Generalized headache 05/16/2015  . Right-sided Bell's palsy 05/16/2015  . H/O: depression 05/16/2015  . Hypertriglyceridemia 05/16/2015  . Blood glucose elevated 05/16/2015  . Irregular menstrual cycle 05/16/2015  . Overweight 05/16/2015  . Paresthesia 05/16/2015  . Disorder of labyrinth 05/16/2015  . Dyspnea 10/07/2014  . Moderate persistent intrinsic asthma without status asthmaticus without complication [id number]/87/5797   Willa Rough DPT, ATC Willa Rough 01/09/2019, 4:41 PM  [place] MAIN [facility] SERVICES [address] [place], [place], [address] Phone: [phone]   Fax:  [phone]  Name: JAMESIA LINNEN MRN: [id number] Date of Birth: 1986/06/12

## 2019-01-09 NOTE — Patient Instructions (Addendum)
   Make sure that you are holding the tool still for as long as it takes for the trigger point to release internally, NOT JUST 1 MIN! To allow enough time for the spasm to release and feel the pain reduce by at least 50% before moving on to another spot.  You can discontinue the exercise with the band where you twist on your knee.  Wall chest press: * Picture on your phone Bring your feet ~ 1.5 feet from the wall and lean into the wall. Start with a slight curve in your low back and when you exhale, push the arms forward and flatten your spine against the wall. Push all the air out and feel your pelvis tuck slightly under and your ribs get closer together.   Repeat 3x10, 1 time per day.  **Try to increase your frequency of exercise, even if you do slightly less repetitions in order to continue to build on your strength and decrease your pain.

## 2019-01-10 LAB — VITAMIN B12: Vitamin B-12: 382 pg/mL (ref 200–1100)

## 2019-01-12 ENCOUNTER — Other Ambulatory Visit: Payer: Self-pay | Admitting: Psychiatry

## 2019-01-17 ENCOUNTER — Ambulatory Visit (INDEPENDENT_AMBULATORY_CARE_PROVIDER_SITE_OTHER): Payer: Medicare Other | Admitting: Psychiatry

## 2019-01-17 ENCOUNTER — Encounter: Payer: Self-pay | Admitting: Licensed Clinical Social Worker

## 2019-01-17 ENCOUNTER — Encounter: Payer: Self-pay | Admitting: Psychiatry

## 2019-01-17 ENCOUNTER — Other Ambulatory Visit: Payer: Self-pay

## 2019-01-17 ENCOUNTER — Ambulatory Visit (INDEPENDENT_AMBULATORY_CARE_PROVIDER_SITE_OTHER): Payer: Medicare Other | Admitting: Licensed Clinical Social Worker

## 2019-01-17 VITALS — BP 107/77 | HR 82 | Temp 99.2°F | Wt 162.4 lb

## 2019-01-17 DIAGNOSIS — F5105 Insomnia due to other mental disorder: Secondary | ICD-10-CM

## 2019-01-17 DIAGNOSIS — F331 Major depressive disorder, recurrent, moderate: Secondary | ICD-10-CM

## 2019-01-17 DIAGNOSIS — F4010 Social phobia, unspecified: Secondary | ICD-10-CM | POA: Diagnosis not present

## 2019-01-17 MED ORDER — SERTRALINE HCL 100 MG PO TABS: 100.0000 mg | ORAL_TABLET | Freq: Every day | ORAL | 0 refills | Status: DC

## 2019-01-17 MED ORDER — VENLAFAXINE HCL ER 37.5 MG PO CP24: 37.5000 mg | ORAL_CAPSULE | Freq: Every day | ORAL | 0 refills | Status: DC

## 2019-01-17 NOTE — Progress Notes (Signed)
   THERAPIST PROGRESS NOTE  Session Time: 1100-1141  Participation Level: Active  Behavioral Response: Well GroomedAlertDepressed  Type of Therapy: Individual Therapy  Treatment Goals addressed: Coping  Interventions: Other: EMDR  Summary: ELSIA WOLFREY is a 33 y.o. female who presents with continued symptoms of her diagnosis. Thekla was joined by her interpreter for her session to help facilitate communication. Jossette reports not doing well since our last session, and reports her depression has maintained a high level. She reports no change in her compulsive behaviors aside from being able to skip a day of pulling hair and picking skin, but added "the following day I just kind of went crazy with it." LCSW normalized those behaviors and reported it may be too much to fully take the behavior away for an entire day without replacing it with another coping mechanism. Roma expressed understanding and agreement with this idea. Audianna reported she forgot to research EMDR to discuss at today's session. LCSW provided Amanda with more information on EMDR, and she stated she was willing to try it, "I want to get better." To begin with, we practiced deep breathing and other affect management skills in order to utilize if she became overwhelmed. Then, we moved on to creating a target sequence plan. Kayliegh identified her negative belief as: "I am alone." She identified her adaptive belief as "I can be alone without being lonely." Reona identified many events that contributed to her idea that she is alone, but had difficulty identifying adaptive events that contribute to her adaptive belief. LCSW chose to start with restricted processing in order to contain affect. Thailyn chose to work on this past weekend where she spent time with her family but felt alone. Jacki Cones rated her SUD (distress level) as a 7-8 about the event and her VOC (belief in adaptive thought) at a 1. She reported feeling her distress and  frustration in her chest. Within five seconds of beginning BLS (bilateral stimulation) Lindsee stopped and stated she felt very stressed. She stated she could not explain where that feeling came from. LCSW encouraged Sagrario to look at this as a positive thing, and her body is responding already to BLS. We chose to stop for the day, and LCSW encouraged Tachelle to research EMDR in order to understand the mechanics behind it moving forward. Veralee expressed understanding and agreement with this plan.   Suicidal/Homicidal: No  Therapist Response: Arisbet continues to work towards her tx goals but has not yet reached them. We will continue to utilize EMDR to assist Marny in processing trauma from her past to improve emotional regulation in daily life.   Plan: Return again in 2 weeks.  Diagnosis: Axis I: MDD (recurrent, moderate)    Axis II: No diagnosis    [name], LCSW 01/17/2019

## 2019-01-17 NOTE — Progress Notes (Signed)
BH MD OP Progress Note  01/17/2019 12:49 PM Helene ShoeLaurie J Brigance  MRN:  [id number]  Chief Complaint: ' I am here for follow up.' Chief Complaint    Follow-up; Medication Refill     HPI: Yesenia ConesLaurie is a 33 yr old Caucasian female, unemployed, single, lives in [place], has a history of depression, anxiety, insomnia, migraine headaches, vitamin B12 deficiency, bilateral hearing loss, tachycardia, mute, presented to clinic today for a follow-up visit.  Patient was evaluated today with the assistance of ASL interpreter-Pat.  Patient today reports she continues to struggle with her mood.  She reports she struggles with anhedonia, lack of motivation and sadness on a regular basis.  She also reports sleep problems.  Patient reports she does not think the Zoloft or her sleep aids are working at this time.  Patient had GeneSight testing done and some time was spent discussing the results with patient.  Based on testing patient may not be a good candidate for SSRIs and this was discussed with patient.  Discussed alternate medications like Effexor with patient.  She agrees with plan.  Even though patient struggles with sleep now discussed with patient that it is better not to make multiple medication changes the same day.  Hence will reevaluate this during future sessions.  Patient continues to be in psychotherapy sessions with Ms. Tasia CatchingsCraig and reports that is beneficial. Visit Diagnosis:    ICD-10-CM   1. MDD (major depressive disorder), recurrent episode, moderate (HCC) F33.1 sertraline (ZOLOFT) 100 MG tablet  2. Social anxiety disorder F40.10 sertraline (ZOLOFT) 100 MG tablet    venlafaxine XR (EFFEXOR-XR) 37.5 MG 24 hr capsule  3. Insomnia due to mental condition F51.05 venlafaxine XR (EFFEXOR-XR) 37.5 MG 24 hr capsule    Past Psychiatric History: I have reviewed past psychiatric history from my progress note on 09/10/2018.  Past trials of Zoloft, Cymbalta, Pamelor, gabapentin, Abilify, Belsomra,  hydroxyzine, trazodone, Ambien, Remeron, Zoloft, Sonata.  Past Medical History:  Past Medical History:  Diagnosis Date  . ADHD (attention deficit hyperactivity disorder)   . B12 deficiency   . Bell's palsy    right sided  . Chronic migraine   . Deaf   . Depression   . 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 Fatigue   . Fatty liver   . Fibromyalgia   . GERD (gastroesophageal reflux disease)   . Headache   . High triglycerides   . Hyperglycemia   . Hypertension   . Hyperthyroidism   . Moderate asthma   . Obesity   . Paresthesia   . Premature birth     Past Surgical History:  Procedure Laterality Date  . BARTHOLIN GLAND CYST REMOVAL    . COCHLEAR IMPLANT    . COCHLEAR IMPLANT REMOVAL    . LAPAROSCOPIC TUBAL LIGATION Bilateral 03/26/2018   Procedure: LAPAROSCOPIC TUBAL LIGATION;  Surgeon: Hildred Laserherry, Anika, MD;  Location: [facility] ORS;  Service: Gynecology;  Laterality: Bilateral;  with peritoneal biopsies  . LAPAROSCOPY    . MYRINGOTOMY WITH TUBE PLACEMENT    . TUBAL LIGATION  03/26/2018    Family Psychiatric History: I have reviewed family psychiatric history from my progress note on 09/10/2018.  Family History:  Family History  Adopted: Yes  Family history unknown: Yes    Social History: Reviewed social history from my progress note on 09/10/2018. Social History   Socioeconomic History  . Marital status: Single    Spouse name: Not on file  .  Number of children: 0  . Years of education: Not on file  . Highest education level: Some college, no degree  Occupational History  . Occupation: disability for hearing loss  Social Needs  . Financial resource strain: Somewhat hard  . Food insecurity:    Worry: Never true    Inability: Never true  . Transportation needs:    Medical: No    Non-medical: No  Tobacco Use  . Smoking status: Former Smoker    Packs/day: 1.00    Years: 2.00    Pack years: 2.00    Types:  Cigarettes    Start date: 11/28/2004    Last attempt to quit: 11/28/2006    Years since quitting: 12.1  . Smokeless tobacco: Never Used  Substance and Sexual Activity  . Alcohol use: Not Currently    Alcohol/week: 0.0 standard drinks    Frequency: Never    Comment: occass  . Drug use: No  . Sexual activity: Not Currently    Birth control/protection: Pill    Comment: Seasonale  Lifestyle  . Physical activity:    Days per week: 7 days    Minutes per session: 150+ min  . Stress: To some extent  Relationships  . Social connections:    Talks on phone: Once a week    Gets together: Never    Attends religious service: Never    Active member of club or organization: No    Attends meetings of clubs or organizations: Never    Relationship status: Never married  Other Topics Concern  . Not on file  Social History Narrative   Lives with her adopted  parents   Hearing loss since birth     Allergies:  Allergies  Allergen Reactions  . Budesonide-Formoterol Fumarate Anaphylaxis and Other (See Comments)    Other reaction(s): Other (See Comments) chest pain Chest pain chest pain  . Astelin [Azelastine]     Tingling, numbness, nausea  . Gabapentin Nausea And Vomiting  . Nortriptyline Other (See Comments)    Other reaction(s): Other (See Comments)  . Diclofenac Other (See Comments)    Other reaction(s): Other (See Comments)  . Duloxetine Hcl Other (See Comments)    Other reaction(s): Other (See Comments)  . Montelukast Sodium Other (See Comments)  . Mupirocin Other (See Comments)  . Naproxen Other (See Comments) and Rash  . Naproxen Sodium Rash    mild rash  . Oxycodone Hcl Rash  . Sulfa Antibiotics Hives and Rash    Metabolic Disorder Labs: Lab Results  Component Value Date   HGBA1C 4.9 01/29/2018   MPG 94 01/29/2018   MPG 97 10/10/2016   No results found for: PROLACTIN Lab Results  Component Value Date   CHOL 131 09/26/2018   TRIG 87 09/26/2018   HDL 45 (L)  09/26/2018   CHOLHDL 2.9 09/26/2018   VLDL 20 10/10/2016   LDLCALC 69 09/26/2018   LDLCALC 195 (H) 06/22/2018   Lab Results  Component Value Date   TSH 2.84 01/29/2018   TSH 1.649 03/02/2016    Therapeutic Level Labs: No results found for: LITHIUM No results found for: VALPROATE No components found for:  CBMZ  Current Medications: Current Outpatient Medications  Medication Sig Dispense Refill  . acetaminophen (TYLENOL) 500 MG tablet Take 1,000 mg by mouth every 8 (eight) hours as needed for headache.    . benzonatate (TESSALON) 100 MG capsule Take 1-2 capsules (100-200 mg total) by mouth 2 (two) times daily as needed. 40 capsule 0  .  BOTOX 100 units SOLR injection     . diazepam (VALIUM) 10 MG tablet Insert 1 tablet nightly into vagina as suppository. Can decrease to 3 times weekly if improvement in symptoms 30 tablet 3  . dicyclomine (BENTYL) 20 MG tablet TAKE 1 TABLET BY MOUTH TWICE A DAY AS NEEDED FOR SPASMS 180 tablet 1  . diltiazem (CARDIZEM CD) 120 MG 24 hr capsule Take 1 capsule (120 mg total) by mouth daily. 90 capsule 3  . diphenhydrAMINE (BENADRYL) 50 MG tablet Take 50 mg by mouth at bedtime as needed for itching.    . metoprolol succinate (TOPROL-XL) 25 MG 24 hr tablet Take 1 tablet (25 mg total) by mouth daily. Take with or immediately following a meal. 90 tablet 3  . norethindrone (AYGESTIN) 5 MG tablet Take 1 tablet (5 mg total) by mouth daily. 30 tablet 2  . rosuvastatin (CRESTOR) 20 MG tablet TAKE 1 TABLET BY MOUTH EVERY DAY 90 tablet 1  . sertraline (ZOLOFT) 100 MG tablet Take 1 tablet (100 mg total) by mouth daily. Take 25 - 50 mg for 10 days as discussed and stop 30 tablet 0  . SPRINTEC 28 0.25-35 MG-MCG tablet     . venlafaxine XR (EFFEXOR-XR) 37.5 MG 24 hr capsule Take 1 capsule (37.5 mg total) by mouth daily with breakfast. 30 capsule 0   No current facility-administered medications for this visit.      Musculoskeletal: Strength & Muscle Tone: within  normal limits Gait & Station: normal Patient leans: N/A  Psychiatric Specialty Exam: Review of Systems  Psychiatric/Behavioral: Positive for depression. The patient has insomnia.   All other systems reviewed and are negative.   Blood pressure 107/77, pulse 82, temperature 99.2 F (37.3 C), temperature source Oral, weight 162 lb 6.4 oz (73.7 kg).Body mass index is 28.77 kg/m.  General Appearance: Casual  Eye Contact:  Fair  Speech:  Clear and Coherent  Volume:  Normal  Mood:  Depressed  Affect:  Congruent  Thought Process:  Goal Directed and Descriptions of Associations: Intact  Orientation:  Full (Time, Place, and Person)  Thought Content: Logical   Suicidal Thoughts:  No  Homicidal Thoughts:  No  Memory:  Immediate;   Fair Recent;   Fair Remote;   Fair  Judgement:  Fair  Insight:  Fair  Psychomotor Activity:  Normal  Concentration:  Concentration: Fair and Attention Span: Fair  Recall:  [employer] of Knowledge: Fair  Language: Fair  Akathisia:  No  Handed:  Right  AIMS (if indicated): denies tremors, rigidity,stiffness  Assets:  Communication Skills Desire for Improvement Social Support  ADL's:  Intact  Cognition: WNL  Sleep:  restless   Screenings: GAD-7     Office Visit from 09/25/2018 in [facility] Office Visit from 06/22/2018 in [facility] Office Visit from 03/19/2018 in [facility] Office Visit from 01/29/2018 in [facility]  Total GAD-7 Score  14  7  12  7     PHQ2-9     Office Visit from 09/25/2018 in [facility] Office Visit from 06/22/2018 in [facility] Office Visit from 03/19/2018 in [facility] Office Visit from 01/29/2018 in [facility] Office Visit from 04/11/2017 in [facility]  PHQ-2 Total Score  3  2  2  2  1   PHQ-9 Total Score  17  12  8  9   -  Assessment and Plan:  Promyce is a 33 yr old  female who is single, unemployed, lives in [place], has a history of depression, anxiety, multiple medical problems, is deaf and mute, presented to clinic today for a follow-up visit.  Patient continues to struggle with depression and sleep problems.  Patient is biologically predisposed given her history of trauma.  Patient also has psychosocial stressors of multiple health problems.  Discussed plan as noted below.  Plan For MDD- unstable Wean off Zoloft.  Patient given instructions to do so. Start Effexor extended release 37.5 mg daily with breakfast Discussed with patient to start l-methylfolate 7.5 mg p.o. daily for folate conversion reduction  For social anxiety disorder- unstable Patient will continue psychotherapy sessions.  For insomnia-unstable Will not make any medication changes today.  Discussed to continue Sonata as well as melatonin as prescribed Patient also has restless leg syndrome however will evaluate this during future sessions since discussed with patient not to make multiple medication changes on the same day given her history of increased sensitivity and side effects to medications in general.   GeneSight testing results were reported and time spent reviewing and discussing results with patient.  Patient also provided a copy of the results.  Follow-up in clinic in 2 weeks or sooner if needed.  I have spent atleast 25 minutes  face to face with patient today. More than 50 % of the time was spent for psychoeducation and supportive psychotherapy and care coordination.  This note was generated in part or whole with voice recognition software. Voice recognition is usually quite accurate but there are transcription errors that can and very often do occur. I apologize for any typographical errors that were not detected and corrected.        Jomarie Longs, MD 01/17/2019, 12:49 PM

## 2019-01-17 NOTE — Patient Instructions (Addendum)
Please start taking Zoloft 50 mg for 4 days and 25 mg for the next 3 days and then start taking 25 mg every other day for another 3 days and then stop taking it.     Venlafaxine extended-release capsules What is this medicine? VENLAFAXINE(VEN la fax een) is used to treat depression, anxiety and panic disorder. This medicine may be used for other purposes; ask your health care provider or pharmacist if you have questions. COMMON BRAND NAME(S): Effexor XR What should I tell my health care provider before I take this medicine? They need to know if you have any of these conditions: -bleeding disorders -glaucoma -heart disease -high blood pressure -high cholesterol -kidney disease -liver disease -low levels of sodium in the blood -mania or bipolar disorder -seizures -suicidal thoughts, plans, or attempt; a previous suicide attempt by you or a family -take medicines that treat or prevent blood clots -thyroid disease -an unusual or allergic reaction to venlafaxine, desvenlafaxine, other medicines, foods, dyes, or preservatives -pregnant or trying to get pregnant -breast-feeding How should I use this medicine? Take this medicine by mouth with a full glass of water. Follow the directions on the prescription label. Do not cut, crush, or chew this medicine. Take it with food. If needed, the capsule may be carefully opened and the entire contents sprinkled on a spoonful of cool applesauce. Swallow the applesauce/pellet mixture right away without chewing and follow with a glass of water to ensure complete swallowing of the pellets. Try to take your medicine at about the same time each day. Do not take your medicine more often than directed. Do not stop taking this medicine suddenly except upon the advice of your doctor. Stopping this medicine too quickly may cause serious side effects or your condition may worsen. A special MedGuide will be given to you by the pharmacist with each prescription and  refill. Be sure to read this information carefully each time. Talk to your pediatrician regarding the use of this medicine in children. Special care may be needed. Overdosage: If you think you have taken too much of this medicine contact a poison control center or emergency room at once. NOTE: This medicine is only for you. Do not share this medicine with others. What if I miss a dose? If you miss a dose, take it as soon as you can. If it is almost time for your next dose, take only that dose. Do not take double or extra doses. What may interact with this medicine? Do not take this medicine with any of the following medications: -certain medicines for fungal infections like fluconazole, itraconazole, ketoconazole, posaconazole, voriconazole -cisapride -desvenlafaxine -dofetilide -dronedarone -duloxetine -levomilnacipran -linezolid -MAOIs like Carbex, Eldepryl, Marplan, Nardil, and Parnate -methylene blue (injected into a vein) -milnacipran -pimozide -thioridazine -ziprasidone This medicine may also interact with the following medications: -amphetamines -aspirin and aspirin-like medicines -certain medicines for depression, anxiety, or psychotic disturbances -certain medicines for migraine headaches like almotriptan, eletriptan, frovatriptan, naratriptan, rizatriptan, sumatriptan, zolmitriptan -certain medicines for sleep -certain medicines that treat or prevent blood clots like dalteparin, enoxaparin, warfarin -cimetidine -clozapine -diuretics -fentanyl -furazolidone -indinavir -isoniazid -lithium -metoprolol -NSAIDS, medicines for pain and inflammation, like ibuprofen or naproxen -other medicines that prolong the QT interval (cause an abnormal heart rhythm) -procarbazine -rasagiline -supplements like St. John's wort, kava kava, valerian -tramadol -tryptophan This list may not describe all possible interactions. Give your health care provider a list of all the medicines,  herbs, non-prescription drugs, or dietary supplements you use. Also tell them  if you smoke, drink alcohol, or use illegal drugs. Some items may interact with your medicine. What should I watch for while using this medicine? Tell your doctor if your symptoms do not get better or if they get worse. Visit your doctor or health care professional for regular checks on your progress. Because it may take several weeks to see the full effects of this medicine, it is important to continue your treatment as prescribed by your doctor. Patients and their families should watch out for new or worsening thoughts of suicide or depression. Also watch out for sudden changes in feelings such as feeling anxious, agitated, panicky, irritable, hostile, aggressive, impulsive, severely restless, overly excited and hyperactive, or not being able to sleep. If this happens, especially at the beginning of treatment or after a change in dose, call your health care professional. This medicine can cause an increase in blood pressure. Check with your doctor for instructions on monitoring your blood pressure while taking this medicine. You may get drowsy or dizzy. Do not drive, use machinery, or do anything that needs mental alertness until you know how this medicine affects you. Do not stand or sit up quickly, especially if you are an older patient. This reduces the risk of dizzy or fainting spells. Alcohol may interfere with the effect of this medicine. Avoid alcoholic drinks. Your mouth may get dry. Chewing sugarless gum, sucking hard candy and drinking plenty of water will help. Contact your doctor if the problem does not go away or is severe. What side effects may I notice from receiving this medicine? Side effects that you should report to your doctor or health care professional as soon as possible: -allergic reactions like skin rash, itching or hives, swelling of the face, lips, or tongue -anxious -breathing  problems -confusion -changes in vision -chest pain -confusion -elevated mood, decreased need for sleep, racing thoughts, impulsive behavior -eye pain -fast, irregular heartbeat -feeling faint or lightheaded, falls -feeling agitated, angry, or irritable -hallucination, loss of contact with reality -high blood pressure -loss of balance or coordination -palpitations -redness, blistering, peeling or loosening of the skin, including inside the mouth -restlessness, pacing, inability to keep still -seizures -stiff muscles -suicidal thoughts or other mood changes -trouble passing urine or change in the amount of urine -trouble sleeping -unusual bleeding or bruising -unusually weak or tired -vomiting Side effects that usually do not require medical attention (report to your doctor or health care professional if they continue or are bothersome): -change in sex drive or performance -change in appetite or weight -constipation -dizziness -dry mouth -headache -increased sweating -nausea -tired This list may not describe all possible side effects. Call your doctor for medical advice about side effects. You may report side effects to FDA at 1-800-FDA-1088. Where should I keep my medicine? Keep out of the reach of children. Store at a controlled temperature between 20 and 25 degrees C (68 degrees and 77 degrees F), in a dry place. Throw away any unused medicine after the expiration date. NOTE: This sheet is a summary. It may not cover all possible information. If you have questions about this medicine, talk to your doctor, pharmacist, or health care provider.  2019 Elsevier/Gold Standard (2016-04-14 18:38:02)

## 2019-01-24 DIAGNOSIS — G43719 Chronic migraine without aura, intractable, without status migrainosus: Secondary | ICD-10-CM | POA: Diagnosis not present

## 2019-01-24 DIAGNOSIS — M542 Cervicalgia: Secondary | ICD-10-CM | POA: Diagnosis not present

## 2019-01-24 DIAGNOSIS — G43111 Migraine with aura, intractable, with status migrainosus: Secondary | ICD-10-CM | POA: Diagnosis not present

## 2019-01-24 DIAGNOSIS — G518 Other disorders of facial nerve: Secondary | ICD-10-CM | POA: Diagnosis not present

## 2019-01-24 DIAGNOSIS — M791 Myalgia, unspecified site: Secondary | ICD-10-CM | POA: Diagnosis not present

## 2019-01-24 DIAGNOSIS — G43109 Migraine with aura, not intractable, without status migrainosus: Secondary | ICD-10-CM | POA: Diagnosis not present

## 2019-01-25 ENCOUNTER — Ambulatory Visit: Payer: Medicare Other | Admitting: Physical Therapy

## 2019-01-25 DIAGNOSIS — M791 Myalgia, unspecified site: Secondary | ICD-10-CM | POA: Diagnosis not present

## 2019-01-25 DIAGNOSIS — M62838 Other muscle spasm: Secondary | ICD-10-CM

## 2019-01-25 DIAGNOSIS — R293 Abnormal posture: Secondary | ICD-10-CM

## 2019-01-25 DIAGNOSIS — M6289 Other specified disorders of muscle: Secondary | ICD-10-CM

## 2019-01-25 DIAGNOSIS — M629 Disorder of muscle, unspecified: Secondary | ICD-10-CM

## 2019-01-25 NOTE — Patient Instructions (Addendum)
half kneeling transitions to and from the floor when there is something to hold onto with one hand:  *front knee above ankle and do not move it. ( see recording on your phone)   Add mini standing lunges with hand on dresser or table   20 x reps  On L with R leg in front,                                10 reps with R leg back, L leg in the front    Start with ski track stance, step back with heel/ toes turned straight, front knee above ankle without moving as your back knee bends and straightens  Keep pelvis and trunk centered between legs without shifting more to the front legs     _______________________  transition from standing to floor:  Wide squat like you are about to pick something up from the floor --> crawl hand down on thigh and then reach other hand onto the ground (all fours)  To get up, all fours--> lifts hips in to Downward Facing Dog  and walk hands backwards to feet --> mini quat --> hands on thighs, then hips then pause HERE  to avoid (moving too quickly up/ blood rush) -->  knees glide forward and roll  Hips up instead of hinging spine up   ________________________  Perform knee to chest stretches to loosen back first   Then place a pillow under back/ pelvis Deep core level 2( handout)    ______   Discontinue R arch orthotic and self massage tool

## 2019-01-25 NOTE — Therapy (Signed)
[place] MAIN [facility] SERVICES [address] [place], [place], [address] Phone: [phone]   Fax:  [phone]  Physical Therapy Treatment  Patient Details  Name: Yesenia Wood MRN: [id number] Date of Birth: 1986/11/14 No data recorded  Encounter Date: 01/25/2019  PT End of Session - 01/25/19 0912    Visit Number  23    Number of Visits  24    Date for PT Re-Evaluation  02/07/19    Authorization Type  Medicare    Authorization - Visit Number  3    Authorization - Number of Visits  5    PT Start Time  0911    PT Stop Time  0958    PT Time Calculation (min)  47 min    Activity Tolerance  Patient tolerated treatment well    Behavior During Therapy  [facility] for tasks assessed/performed       Past Medical History:  Diagnosis Date  . ADHD (attention deficit hyperactivity disorder)   . B12 deficiency   . Bell's palsy    right sided  . Chronic migraine   . Deaf   . Depression   . 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 Fatigue   . Fatty liver   . Fibromyalgia   . GERD (gastroesophageal reflux disease)   . Headache   . High triglycerides   . Hyperglycemia   . Hypertension   . Hyperthyroidism   . Moderate asthma   . Obesity   . Paresthesia   . Premature birth     Past Surgical History:  Procedure Laterality Date  . BARTHOLIN GLAND CYST REMOVAL    . COCHLEAR IMPLANT    . COCHLEAR IMPLANT REMOVAL    . LAPAROSCOPIC TUBAL LIGATION Bilateral 03/26/2018   Procedure: LAPAROSCOPIC TUBAL LIGATION;  Surgeon: Rubie Maid, MD;  Location: [facility] ORS;  Service: Gynecology;  Laterality: Bilateral;  with peritoneal biopsies  . LAPAROSCOPY    . MYRINGOTOMY WITH TUBE PLACEMENT    . TUBAL LIGATION  03/26/2018    There were no vitals filed for this visit.  Subjective Assessment - 01/25/19 0913    Subjective  Pt found there was more pain with use of the self massage tool even after trying to  hold and release but made the muscle tighter, especially on the lower L muscles.  Pt has constipation and has bowel movements once every 2 weeks      Patient is accompained by:  Interpreter         [facility] PT Assessment - 01/25/19 0933      Lunges   Comments  poor knee alignment , eccentric control poor with lowering       Posture/Postural Control   Posture Comments  minor cues required for deep core level 2       Strength   Overall Strength Comments  hip abd R 5/5, L 4-/5   .  hip ext R 5/5, L 4-/5       Palpation   Spinal mobility  tightness at paraspinals R/L at L1-5 ( tenderness reported)                 Pelvic Floor Special Questions - 01/25/19 0949    External Perineal Exam  through clothing: L tenderness > R                   PT Short Term Goals - 11/29/18 8478  PT SHORT TERM GOAL #1   Title  Patient will demonstrate improved pelvic allignment in standing and supine for improved pelvic position for PFM length/tension relationship.    Time  5    Period  Weeks    Status  Achieved    Target Date  01/15/18      PT SHORT TERM GOAL #2   Title  Patient will undergo Internal/External assessment of the PFM to allow or direct treatment of PFM spasms.    Baseline  Pt. is avoidant of all internal and external pelvic exam    Time  5    Period  Weeks    Status  Not Met    Target Date  01/16/18      PT SHORT TERM GOAL #3   Title  Patient will demonstrate appropriate body mechanics with bending and lifting heavy objects to allow for decreased stress on the pelvic floor and low back.     Time  5    Period  Weeks    Status  Achieved    Target Date  01/15/18      PT SHORT TERM GOAL #4   Title  Patient will perform self external TP release to decrease spasm and sensitivity of nerves to allow for ecreased pain.    Baseline  Pt. has reported trying this at home but seems to still be avoidant of touch.    Time  5    Period  Weeks    Status  Achieved     Target Date  07/30/18      PT SHORT TERM GOAL #5   Title  Patient will describe Max pain of no greater than 3/10 over prior week to demonstrate symptom improvement and improved quality of life.    Baseline  As on 12/9: max pain of 5/10    Time  5    Period  Weeks    Status  On-going    Target Date  02/07/19        PT Long Term Goals - 01/25/19 1304      PT LONG TERM GOAL #1   Title  Patient will score at or below 8/43 on the Female NIH-CPSI and 5 on the VQ to demonstrate a clinically meaningful decrease in disability and distress due to pelvic floor dysfunction.    Baseline  NIH- CPSI: 18/43, VQ: 10/33 As of 12/9: NIH- CPSI: 12/43, VQ: 15/33    Time  10    Period  Weeks    Status  On-going      PT LONG TERM GOAL #2   Title  Patient will demonstrate ability to use a tool for self TP release to allow for maintenence of spasm release upon D/C.    Time  10    Period  Weeks    Status  On-going      PT LONG TERM GOAL #3   Title  Patient will report a reduction in pain to no greater than 2/10 over the prior week to demonstrate symptom improvement.    Baseline  As of 12/9: Max of 5/10 over past 2 weeks    Time  10    Period  Weeks    Status  On-going      PT LONG TERM GOAL #4   Title  Patient will demonstrate improved sitting and standing posture to demonstrate learning and decrease stress on the pelvic floor with functional activity.    Baseline  Pt. demonstrates improved posture ~ 85%  of the time.    Time  10    Period  Weeks    Status  Achieved      PT LONG TERM GOAL #5   Title  Patient will perform regular exercise over the prior 2 weeks to demonstrate improved QOL and ability to maintain symptom improvement upon D/C    Time  10    Period  Weeks    Status  Achieved            Plan - 01/25/19 0626    Clinical Impression Statement  Pt demo'd weakness in L hip ext/abd mm along with tightness at L pelvic floor .  Focused on strengthening L hip mm to minimize overuse of  L pelvic floor. Pt reported increased mm tightness with use of self-massage tool and wearing of R arch support and thus, discontinuing these two items for her program.  Progressed to deep core strengthening to improve LBP and stability but pt required back stretches and hip elevation with a pillow to lengthen back muscles to minimize pain with this new exercise.  Pt required cues for propioception of knee alignment and technique with half kneeling transfer from stand<>floor. Added glut strengthening with mini-lunges to progress back to this functional task.  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 Pt continues to benefit from skilled PT.        Clinical Impairments Affecting Rehab Potential  Deaf, multiple co-morbidities, fibromyalgia, fear of direct pelvic exam and treatment.    PT Frequency  Biweekly    PT Duration  Other (comment)   10 weeks   PT Treatment/Interventions  ADLs/Self Care Home Management;Aquatic Therapy;Biofeedback;Moist Heat;Traction;Ultrasound;Balance training;Gait training;Functional mobility training;Therapeutic activities;Therapeutic exercise;Neuromuscular re-education;Patient/family education;Orthotic Fit/Training;Scar mobilization;Manual techniques;Manual lymph drainage;Dry needling;Taping    PT Next Visit Plan  Ask about self-internal release success and answer questions PRN. review tall-kneeling/half kneeling stability exercises and chest-press leaning against wall for rib-flare, add them/replace some of current HEP for appopriate challenge. Increase strengthening exercises as able witout pain increase.    PT Home Exercise Plan  diaphragmatic  breathing, TA in quadruped, child's pose, adductor stretch, piriformis stretch, squatty potty, L side stretch, hip abduction, tall posture walking.  Bird-dog, multi-stepping, mini-marches, prone press-ups, TP release with lacrosse ball., Heels squeeze in Prone, thoracic extensions over towel roll, self active MET in half-kneel. L heel-lift, hip IR exercise and B inversion strengthening    Consulted and Agree with Plan of Care  Patient       Patient will benefit from skilled therapeutic intervention in order to improve the following deficits and impairments:  Dizziness, Increased fascial restricitons, Impaired sensation, Improper body mechanics, Pain, Decreased coordination, Hypermobility, Postural dysfunction, Increased muscle spasms, Impaired tone, Decreased balance, Difficulty walking  Visit Diagnosis: Other muscle spasm  Abnormal posture  Muscular imbalance  Myalgia     Problem List Patient Active Problem List   Diagnosis Date Noted  . 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 OSA (obstructive sleep apnea) 10/10/2016  . Other insomnia 10/10/2016  . 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 Sleep disturbance 05/10/2016  . Chronic migraine without aura 03/29/2016  . Costochondral chest pain 12/28/2015  . Pain in the chest 06/09/2015  .  Palpitations 06/09/2015  . Deaf 05/16/2015  . B12 deficiency 05/16/2015  . Cochlear implant status 05/16/2015  . Fatty infiltration of liver 05/16/2015  . Fibromyalgia syndrome 05/16/2015  . Gastro-esophageal reflux disease without esophagitis 05/16/2015  . Generalized headache 05/16/2015  . Right-sided Bell's palsy 05/16/2015  . H/O: depression 05/16/2015  . Hypertriglyceridemia 05/16/2015  . Blood glucose elevated 05/16/2015  . Irregular menstrual cycle 05/16/2015  . Overweight 05/16/2015  . Paresthesia 05/16/2015  . Disorder of labyrinth 05/16/2015  . Dyspnea 10/07/2014  . Moderate persistent intrinsic asthma without status asthmaticus without  complication 81/84/0375    Jerl Mina ,PT, DPT, E-RYT  01/25/2019, 1:05 PM  [place] MAIN [facility] SERVICES [address] [place], [place], [address] Phone: [phone]   Fax:  [phone]  Name: IKEISHA BLUMBERG MRN: [id number] Date of Birth: 28-May-1986

## 2019-01-27 ENCOUNTER — Other Ambulatory Visit: Payer: Self-pay | Admitting: Cardiovascular Disease

## 2019-02-06 ENCOUNTER — Ambulatory Visit: Payer: Medicare Other | Attending: Obstetrics and Gynecology | Admitting: Physical Therapy

## 2019-02-06 ENCOUNTER — Other Ambulatory Visit: Payer: Self-pay

## 2019-02-06 DIAGNOSIS — R293 Abnormal posture: Secondary | ICD-10-CM

## 2019-02-06 DIAGNOSIS — M6289 Other specified disorders of muscle: Secondary | ICD-10-CM

## 2019-02-06 DIAGNOSIS — M629 Disorder of muscle, unspecified: Secondary | ICD-10-CM | POA: Diagnosis not present

## 2019-02-06 DIAGNOSIS — M62838 Other muscle spasm: Secondary | ICD-10-CM | POA: Diagnosis not present

## 2019-02-06 DIAGNOSIS — M791 Myalgia, unspecified site: Secondary | ICD-10-CM | POA: Diagnosis not present

## 2019-02-06 NOTE — Therapy (Signed)
[place] MAIN [facility] SERVICES [address] [place], [place], [address] Phone: [phone]   Fax:  [phone]  Physical Therapy Treatment  Patient Details  Name: Yesenia Wood MRN: [id number] Date of Birth: 1986/06/15 No data recorded  Encounter Date: 02/06/2019  PT End of Session - 02/06/19 0914    Visit Number  24    Number of Visits      Date for PT Re-Evaluation 03/06/2019     Authorization Type  Medicare    Authorization - Visit Number  3    Authorization - Number of Visits  5    PT Start Time  0913    PT Stop Time  1005    PT Time Calculation (min)  52 min    Activity Tolerance  Patient tolerated treatment well    Behavior During Therapy  [facility] for tasks assessed/performed       Past Medical History:  Diagnosis Date  . ADHD (attention deficit hyperactivity disorder)   . B12 deficiency   . Bell's palsy    right sided  . Chronic migraine   . Deaf   . Depression   . 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 Fatigue   . Fatty liver   . Fibromyalgia   . GERD (gastroesophageal reflux disease)   . Headache   . High triglycerides   . Hyperglycemia   . Hypertension   . Hyperthyroidism   . Moderate asthma   . Obesity   . Paresthesia   . Premature birth     Past Surgical History:  Procedure Laterality Date  . BARTHOLIN GLAND CYST REMOVAL    . COCHLEAR IMPLANT    . COCHLEAR IMPLANT REMOVAL    . LAPAROSCOPIC TUBAL LIGATION Bilateral 03/26/2018   Procedure: LAPAROSCOPIC TUBAL LIGATION;  Surgeon: Rubie Maid, MD;  Location: [facility] ORS;  Service: Gynecology;  Laterality: Bilateral;  with peritoneal biopsies  . LAPAROSCOPY    . MYRINGOTOMY WITH TUBE PLACEMENT    . TUBAL LIGATION  03/26/2018    There were no vitals filed for this visit.  Subjective Assessment - 02/06/19 0918    Subjective  Pt stopped taking Zoloft as prescribed by her doctor.  Pt only does the exercises every  other day because she finds if she does them daily, the fibromyalgia is worse.     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, overuse of pect mm       Palpation   Palpation comment  increased B paraspinal/ medial scap mm tightness                     [facility] Adult PT Treatment/Exercise - 02/06/19 1127      Therapeutic Activites    Therapeutic Activities  --   modified lifting/pulling activities on farm, educated rest     Neuro Re-ed    Neuro Re-ed Details   cued and education on principles of body mechanics for more stability in changes from ground to stand, cued for m ore scapular retraction when pulling and lifting, and sitting, cued for relaxation techniques       Manual Therapy   Manual therapy comments  STM along B paraspinals/ medial scapular mm  PT Short Term Goals - 02/06/19 1138      PT SHORT TERM GOAL #1   Title  Patient will demonstrate improved pelvic allignment in standing and supine for improved pelvic position for PFM length/tension relationship.    Time  5    Period  Weeks    Status  Achieved    Target Date  01/15/18      PT SHORT TERM GOAL #2   Title  Patient will undergo Internal/External assessment of the PFM to allow or direct treatment of PFM spasms.    Baseline  Pt. is avoidant of all internal and external pelvic exam    Time  5    Period  Weeks    Status  Deferred    Target Date  01/16/18      PT SHORT TERM GOAL #3   Title  Patient will demonstrate appropriate body mechanics with bending and lifting heavy objects to allow for decreased stress on the pelvic floor and low back.     Time  5    Period  Weeks    Status  Achieved    Target Date  01/15/18      PT SHORT TERM GOAL #4   Title  Patient will perform self external TP release to decrease spasm and  sensitivity of nerves to allow for ecreased pain.    Baseline  Pt. has reported trying this at home but seems to still be avoidant of touch.    Time  5    Period  Weeks    Status  Achieved    Target Date  07/30/18      PT SHORT TERM GOAL #5   Title  Patient will describe Max pain of no greater than 3/10 over prior week to demonstrate symptom improvement and improved quality of life.    Baseline  As on 12/9: max pain of 5/10    Time  5    Period  Weeks    Status  On-going    Target Date  02/07/19        PT Long Term Goals - 02/06/19 1138      PT LONG TERM GOAL #1   Title  Patient will score at or below 8/43 on the Female NIH-CPSI and 5 on the VQ to demonstrate a clinically meaningful decrease in disability and distress due to pelvic floor dysfunction.    Baseline  NIH- CPSI: 18/43, VQ: 10/33 As of 12/9: NIH- CPSI: 12/43, VQ: 15/33    Time  10    Period  Weeks    Status  On-going      PT LONG TERM GOAL #2   Title  Patient will demonstrate ability to use a tool for self TP release to allow for maintenence of spasm release upon D/C.    Time  10    Period  Weeks    Status  Deferred      PT LONG TERM GOAL #3   Title  Patient will report a reduction in pain to no greater than 2/10 over the prior week to demonstrate symptom improvement.    Baseline  As of 12/9: Max of 5/10 over past 2 weeks    Time  10    Period  Weeks    Status  On-going      PT LONG TERM GOAL #4   Title  Patient will demonstrate improved sitting and standing posture to demonstrate learning and decrease stress on the pelvic floor with functional activity.  Baseline  Pt. demonstrates improved posture ~ 85% of the time.    Time  10    Period  Weeks    Status  Achieved      PT LONG TERM GOAL #5   Title  Patient will perform regular exercise over the prior 2 weeks to demonstrate improved QOL and ability to maintain symptom improvement upon D/C    Time  10    Period  Weeks    Status  Achieved             Plan - 02/06/19 6503    Clinical Impression Statement  Pt has achieved 3/5 STG and 2/5 LTG. Pt is progressing well towards remaining goals.   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  Reviewed floor <> stand transfer which pt demo'd good carry over. Applied manual Tx and relaxation training to posterior back/ shoulder tightness. Provided body mechanics training for simualted farm activiteis to minimize  lifting/pulling activities on far. Emphasized and educated resting break in restorative posture with legs propped on pillow to impliment between farm chores to minimize fatigue 2/2 fibromyalgia. Provided shoulder stretches into HEP. Pt reported she would like to be d/c after next session as pt would like to focus on working with her psychiatrist.  Pt benefits from skilled PT for one more visit. Plan to address body mechanics modifications for farm activities and continue adding gentle stretches at next session.     Clinical Impairments Affecting Rehab Potential  Deaf, multiple co-morbidities, fibromyalgia, fear of direct pelvic exam and treatment.    PT Frequency  Biweekly    PT Duration  Other (comment)   10 weeks   PT Treatment/Interventions  ADLs/Self Care Home Management;Aquatic Therapy;Biofeedback;Moist Heat;Traction;Ultrasound;Balance training;Gait training;Functional mobility training;Therapeutic activities;Therapeutic exercise;Neuromuscular re-education;Patient/family education;Orthotic Fit/Training;Scar mobilization;Manual techniques;Manual lymph drainage;Dry needling;Taping    PT Next Visit Plan  Ask about self-internal release success and answer questions PRN. review tall-kneeling/half kneeling stability exercises and chest-press leaning against wall for rib-flare, add them/replace some of current HEP for appopriate challenge. Increase strengthening exercises as able  witout pain increase.    PT Home Exercise Plan  diaphragmatic breathing, TA in quadruped, child's pose, adductor stretch, piriformis stretch, squatty potty, L side stretch, hip abduction, tall posture walking.  Bird-dog, multi-stepping, mini-marches, prone press-ups, TP release with lacrosse ball., Heels squeeze in Prone, thoracic extensions over towel roll, self active MET in half-kneel. L heel-lift, hip IR exercise and B inversion strengthening    Consulted and Agree with Plan of Care  Patient       Patient will benefit from skilled therapeutic intervention in order to improve the following deficits and impairments:  Dizziness, Increased fascial restricitons, Impaired sensation, Improper body mechanics, Pain, Decreased coordination, Hypermobility, Postural dysfunction, Increased muscle spasms, Impaired tone, Decreased balance, Difficulty walking  Visit Diagnosis: Other muscle spasm  Abnormal posture  Muscular imbalance  Myalgia     Problem List Patient Active Problem List   Diagnosis Date Noted  . 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 OSA (obstructive sleep apnea) 10/10/2016  . Other insomnia 10/10/2016  . 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 Sleep disturbance 05/10/2016  . Chronic migraine without aura 03/29/2016  . Costochondral chest pain 12/28/2015  . Pain in the chest 06/09/2015  . Palpitations 06/09/2015  . Deaf 05/16/2015  . B12 deficiency 05/16/2015  . Cochlear implant status 05/16/2015  . Fatty infiltration of liver 05/16/2015  . Fibromyalgia syndrome  05/16/2015  . Gastro-esophageal reflux disease without esophagitis 05/16/2015  . Generalized headache 05/16/2015  . Right-sided Bell's palsy 05/16/2015  . H/O: depression 05/16/2015  . Hypertriglyceridemia 05/16/2015  . Blood glucose elevated 05/16/2015  . Irregular menstrual cycle 05/16/2015  . Overweight 05/16/2015  . Paresthesia 05/16/2015  . Disorder of labyrinth 05/16/2015  . Dyspnea 10/07/2014  . Moderate  persistent intrinsic asthma without status asthmaticus without complication 70/35/0093    Jerl Mina ,PT, DPT, E-RYT  02/06/2019, 11:39 AM  [place] MAIN [facility] SERVICES [address] [place], [place], [address] Phone: [phone]   Fax:  [phone]  Name: Yesenia Wood MRN: [id number] Date of Birth: [birth year]

## 2019-02-06 NOTE — Patient Instructions (Addendum)
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 Feet on the ground , sitting bones on chair.

## 2019-02-07 ENCOUNTER — Encounter: Payer: Self-pay | Admitting: Obstetrics and Gynecology

## 2019-02-07 ENCOUNTER — Ambulatory Visit (INDEPENDENT_AMBULATORY_CARE_PROVIDER_SITE_OTHER): Payer: Medicare Other | Admitting: Psychiatry

## 2019-02-07 ENCOUNTER — Ambulatory Visit (INDEPENDENT_AMBULATORY_CARE_PROVIDER_SITE_OTHER): Payer: Medicare Other | Admitting: Obstetrics and Gynecology

## 2019-02-07 ENCOUNTER — Other Ambulatory Visit: Payer: Self-pay

## 2019-02-07 ENCOUNTER — Encounter: Payer: Self-pay | Admitting: Psychiatry

## 2019-02-07 VITALS — BP 114/80 | HR 83 | Temp 98.5°F | Wt 167.0 lb

## 2019-02-07 VITALS — BP 112/78 | HR 88 | Ht 63.0 in | Wt 166.7 lb

## 2019-02-07 DIAGNOSIS — R102 Pelvic and perineal pain: Secondary | ICD-10-CM | POA: Diagnosis not present

## 2019-02-07 DIAGNOSIS — F331 Major depressive disorder, recurrent, moderate: Secondary | ICD-10-CM | POA: Diagnosis not present

## 2019-02-07 DIAGNOSIS — F5105 Insomnia due to other mental disorder: Secondary | ICD-10-CM | POA: Diagnosis not present

## 2019-02-07 DIAGNOSIS — N938 Other specified abnormal uterine and vaginal bleeding: Secondary | ICD-10-CM

## 2019-02-07 DIAGNOSIS — F4010 Social phobia, unspecified: Secondary | ICD-10-CM

## 2019-02-07 MED ORDER — ROPINIROLE HCL 0.25 MG PO TABS: 0.2500 mg | ORAL_TABLET | Freq: Every day | ORAL | 1 refills | Status: DC

## 2019-02-07 MED ORDER — VENLAFAXINE HCL ER 75 MG PO CP24: 75.0000 mg | ORAL_CAPSULE | Freq: Every day | ORAL | 1 refills | Status: DC

## 2019-02-07 MED ORDER — NORETHINDRONE ACETATE 5 MG PO TABS: 2.5000 mg | ORAL_TABLET | Freq: Every day | ORAL | 6 refills | Status: DC

## 2019-02-07 NOTE — Patient Instructions (Signed)
Venlafaxine extended-release capsules  What is this medicine?  VENLAFAXINE(VEN la fax een) is used to treat depression, anxiety and panic disorder.  This medicine may be used for other purposes; ask your health care provider or pharmacist if you have questions.  COMMON BRAND NAME(S): Effexor XR  What should I tell my health care provider before I take this medicine?  They need to know if you have any of these conditions:  -bleeding disorders  -glaucoma  -heart disease  -high blood pressure  -high cholesterol  -kidney disease  -liver disease  -low levels of sodium in the blood  -mania or bipolar disorder  -seizures  -suicidal thoughts, plans, or attempt; a previous suicide attempt by you or a family  -take medicines that treat or prevent blood clots  -thyroid disease  -an unusual or allergic reaction to venlafaxine, desvenlafaxine, other medicines, foods, dyes, or preservatives  -pregnant or trying to get pregnant  -breast-feeding  How should I use this medicine?  Take this medicine by mouth with a full glass of water. Follow the directions on the prescription label. Do not cut, crush, or chew this medicine. Take it with food. If needed, the capsule may be carefully opened and the entire contents sprinkled on a spoonful of cool applesauce. Swallow the applesauce/pellet mixture right away without chewing and follow with a glass of water to ensure complete swallowing of the pellets. Try to take your medicine at about the same time each day. Do not take your medicine more often than directed. Do not stop taking this medicine suddenly except upon the advice of your doctor. Stopping this medicine too quickly may cause serious side effects or your condition may worsen.  A special MedGuide will be given to you by the pharmacist with each prescription and refill. Be sure to read this information carefully each time.  Talk to your pediatrician regarding the use of this medicine in children. Special care may be  needed.  Overdosage: If you think you have taken too much of this medicine contact a poison control center or emergency room at once.  NOTE: This medicine is only for you. Do not share this medicine with others.  What if I miss a dose?  If you miss a dose, take it as soon as you can. If it is almost time for your next dose, take only that dose. Do not take double or extra doses.  What may interact with this medicine?  Do not take this medicine with any of the following medications:  -certain medicines for fungal infections like fluconazole, itraconazole, ketoconazole, posaconazole, voriconazole  -cisapride  -desvenlafaxine  -dofetilide  -dronedarone  -duloxetine  -levomilnacipran  -linezolid  -MAOIs like Carbex, Eldepryl, Marplan, Nardil, and Parnate  -methylene blue (injected into a vein)  -milnacipran  -pimozide  -thioridazine  -ziprasidone  This medicine may also interact with the following medications:  -amphetamines  -aspirin and aspirin-like medicines  -certain medicines for depression, anxiety, or psychotic disturbances  -certain medicines for migraine headaches like almotriptan, eletriptan, frovatriptan, naratriptan, rizatriptan, sumatriptan, zolmitriptan  -certain medicines for sleep  -certain medicines that treat or prevent blood clots like dalteparin, enoxaparin, warfarin  -cimetidine  -clozapine  -diuretics  -fentanyl  -furazolidone  -indinavir  -isoniazid  -lithium  -metoprolol  -NSAIDS, medicines for pain and inflammation, like ibuprofen or naproxen  -other medicines that prolong the QT interval (cause an abnormal heart rhythm)  -procarbazine  -rasagiline  -supplements like St. John's wort, kava kava, valerian  -tramadol  -tryptophan    worse. Visit your doctor or health care professional for regular checks on your progress. Because it may take several weeks to see the full effects of this medicine, it is important to continue your treatment as prescribed by your doctor. Patients and their families should watch out for new or worsening thoughts of suicide or depression. Also watch out for sudden changes in feelings such as feeling anxious, agitated, panicky, irritable, hostile, aggressive, impulsive, severely restless, overly excited and hyperactive, or not being able to sleep. If this happens, especially at the beginning of treatment or after a change in dose, call your health care professional. This medicine can cause an increase in blood pressure. Check with your doctor for instructions on monitoring your blood pressure while taking this medicine. You may get drowsy or dizzy. Do not drive, use machinery, or do anything that needs mental alertness until you know how this medicine affects you. Do not stand or sit up quickly, especially if you are an older patient. This reduces the risk of dizzy or fainting spells. Alcohol may interfere with the effect of this medicine. Avoid alcoholic drinks. Your mouth may get dry. Chewing sugarless gum, sucking hard candy and drinking plenty of water will help. Contact your doctor if the problem does not go away or is severe. What side effects may I notice from receiving this medicine? Side effects that you should report to your doctor or health care professional as soon as possible: -allergic reactions like skin rash, itching or hives, swelling of the face, lips, or tongue -anxious -breathing problems -confusion -changes in vision -chest pain -confusion -elevated mood, decreased need for sleep, racing thoughts, impulsive behavior -eye pain -fast, irregular  heartbeat -feeling faint or lightheaded, falls -feeling agitated, angry, or irritable -hallucination, loss of contact with reality -high blood pressure -loss of balance or coordination -palpitations -redness, blistering, peeling or loosening of the skin, including inside the mouth -restlessness, pacing, inability to keep still -seizures -stiff muscles -suicidal thoughts or other mood changes -trouble passing urine or change in the amount of urine -trouble sleeping -unusual bleeding or bruising -unusually weak or tired -vomiting Side effects that usually do not require medical attention (report to your doctor or health care professional if they continue or are bothersome): -change in sex drive or performance -change in appetite or weight -constipation -dizziness -dry mouth -headache -increased sweating -nausea -tired This list may not describe all possible side effects. Call your doctor for medical advice about side effects. You may report side effects to FDA at 1-800-FDA-1088. Where should I keep my medicine? Keep out of the reach of children. Store at a controlled temperature between 20 and 25 degrees C (68 degrees and 77 degrees F), in a dry place. Throw away any unused medicine after the expiration date. NOTE: This sheet is a summary. It may not cover all possible information. If you have questions about this medicine, talk to your doctor, pharmacist, or health care provider.  2019 [employer] Standard (2016-04-14 18:38:02) Ropinirole tablets What is this medicine? ROPINIROLE (roe PIN i role) is used to treat the symptoms of Parkinson's disease. It helps to improve muscle control and movement difficulties. It is also used for the treatment of Restless Legs Syndrome. This medicine may be used for other purposes; ask your health care provider or pharmacist if you have questions. COMMON BRAND NAME(S): Requip What should I tell my health care provider before I take this  medicine? They need to know if you have any of these conditions: -dizzy  or fainting spells -heart disease -high blood pressure -kidney disease -liver disease -low blood pressure -sleeping problems -an unusual or allergic reaction to ropinirole, other medicines, foods, dyes, or preservatives -pregnant or trying to get pregnant -breast-feeding How should I use this medicine? Take this medicine by mouth with a glass of water. Follow the directions on the prescription label. You can take it with or without food. If it upsets your stomach, take it with food. Take your doses at regular intervals. Do not take your medicine more often than directed. Do not stop taking this medicine except on your doctor's advice. Stopping this medicine too quickly may cause serious side effects. Talk to your pediatrician regarding the use of this medicine in children. Special care may be needed. Overdosage: If you think you have taken too much of this medicine contact a poison control center or emergency room at once. NOTE: This medicine is only for you. Do not share this medicine with others. What if I miss a dose? If you miss a dose, take it as soon as you can. If it is almost time for your next dose, take only that dose. Do not take double or extra doses. What may interact with this medicine? -ciprofloxacin -female hormones, like estrogens and birth control pills -medicines for depression, anxiety, or psychotic disturbances -metoclopramide -mexiletine -norfloxacin -omeprazole This list may not describe all possible interactions. Give your health care provider a list of all the medicines, herbs, non-prescription drugs, or dietary supplements you use. Also tell them if you smoke, drink alcohol, or use illegal drugs. Some items may interact with your medicine. What should I watch for while using this medicine? Visit your doctor or health care professional for regular checks on your progress. It may be several  weeks or months before you feel the full effect of this medicine. You may get drowsy or dizzy. Do not drive, use machinery, or do anything that needs mental alertness until you know how this drug affects you. Do not stand or sit up quickly, especially if you are an older patient. This reduces the risk of dizzy or fainting spells. Alcohol can increase possible dizziness. Avoid alcoholic drinks. If you find that you have sudden feelings of wanting to sleep during normal activities, like cooking, watching television, or while driving or riding in a car, you should contact your health care professional. Your mouth may get dry. Chewing sugarless gum or sucking hard candy, and drinking plenty of water may help. Contact your doctor if the problem does not go away or is severe. There have been reports of increased sexual urges or other strong urges such as gambling while taking some medicines for Parkinson's disease. If you experience any of these urges while taking this medicine, you should report it to your health care provider as soon as possible. You should check your skin often for changes to moles and new growths while taking this medicine. Call your doctor if you notice any of these changes. What side effects may I notice from receiving this medicine? Side effects that you should report to your doctor or health care professional as soon as possible: -allergic reactions like skin rash, itching or hives, swelling of the face, lips, or tongue -changes in vision -chest pain -confusion -falling asleep during normal activities like driving -fast, irregular heartbeat -feeling faint or lightheaded, falls -hallucination, loss of contact with reality -joint or muscle pain -loss of bladder control -loss of memory -new or increased gambling urges, sexual urges, uncontrolled spending,  binge or compulsive eating, or other urges -pain, tingling, numbness in the hands or feet -shortness of breath, troubled  breathing, tightness in chest, or wheezing -signs and symptoms of low blood pressure like dizziness; feeling faint or lightheaded, falls; unusually weak or tired -swelling of the ankles, feet, hands -uncontrollable head, mouth, neck, arm, or leg movements -vomiting Side effects that usually do not require medical attention (report to your doctor or health care professional if they continue or are bothersome): -dizziness -drowsiness -headache -increased sweating -nausea -tremors This list may not describe all possible side effects. Call your doctor for medical advice about side effects. You may report side effects to FDA at 1-800-FDA-1088. Where should I keep my medicine? Keep out of the reach of children. Store at room temperature between 20 and 25 degrees C (68 and 77 degrees F). Protect from light and moisture. Keep container tightly closed. Throw away any unused medicine after the expiration date. NOTE: This sheet is a summary. It may not cover all possible information. If you have questions about this medicine, talk to your doctor, pharmacist, or health care provider.  2019 [employer] Standard (2016-05-02 10:58:05)

## 2019-02-07 NOTE — Progress Notes (Signed)
Pt is present today for a follow up for endometriosis. Pt stated that she is still not seeing any improvement.

## 2019-02-07 NOTE — Progress Notes (Signed)
BH MD OP Progress Note  02/07/2019 1:54 PM JULIEANNE KASEY  MRN:  [id number]  Chief Complaint:  Chief Complaint    Follow-up    I am here for follow-up  HPI: Yesenia Wood is a 33 year old Caucasian female, unemployed, single, lives in [place], has a history of depression, anxiety, insomnia, migraine headaches, vitamin B12 deficiency, bilateral hearing loss, tachycardia, mute, presented to clinic today for a follow-up visit.  Patient today was evaluated with the assistance of ASL interpreter-Pat.  Patient today reports she has completely stopped the Zoloft.  She reports she is currently on the Effexor extended release 37.5 mg.  She has been taking it since the past 10 days.  She reports she has not noticed much good benefits yet.  She however does report some stomach pain and does not know if the medication is causing it.  She reports when she takes it in the morning it makes her tired and sleepy during the day and hence she has been taking it with her lunch.  Taking it with her lunch helps her to stay awake during the day.  Patient reports she has stopped taking her sleep medication since they were not helpful.  Sleep has gotten a little bit better however she continues to struggle with restlessness at night.  Patient with possible restless leg syndrome reports her lower extremities as very restless at night.  Discussed with patient about adding Requip to help with the restless leg syndrome.  Patient continues to be in psychotherapy sessions and has upcoming appointment scheduled with her therapist.  Patient denies any other concerns today. Visit Diagnosis:    ICD-10-CM   1. MDD (major depressive disorder), recurrent episode, moderate (HCC) F33.1 venlafaxine XR (EFFEXOR XR) 75 MG 24 hr capsule    rOPINIRole (REQUIP) 0.25 MG tablet  2. Social anxiety disorder F40.10 venlafaxine XR (EFFEXOR XR) 75 MG 24 hr capsule    rOPINIRole (REQUIP) 0.25 MG tablet  3. Insomnia due to mental condition F51.05      Past Psychiatric History: I have reviewed past psychiatric history from my progress note on 09/10/2018.  Past trials of Zoloft, Cymbalta, Pamelor, gabapentin, Abilify, Belsomra, hydroxyzine, trazodone, Ambien, Remeron, Zoloft, Sonata.  Past Medical History:  Past Medical History:  Diagnosis Date  . ADHD (attention deficit hyperactivity disorder)   . B12 deficiency   . Bell's palsy    right sided  . Chronic migraine   . Deaf   . Depression   . 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 Fatigue   . Fatty liver   . Fibromyalgia   . GERD (gastroesophageal reflux disease)   . Headache   . High triglycerides   . Hyperglycemia   . Hypertension   . Hyperthyroidism   . Moderate asthma   . Obesity   . Paresthesia   . Premature birth     Past Surgical History:  Procedure Laterality Date  . BARTHOLIN GLAND CYST REMOVAL    . COCHLEAR IMPLANT    . COCHLEAR IMPLANT REMOVAL    . LAPAROSCOPIC TUBAL LIGATION Bilateral 03/26/2018   Procedure: LAPAROSCOPIC TUBAL LIGATION;  Surgeon: Hildred Laser, MD;  Location: [facility] ORS;  Service: Gynecology;  Laterality: Bilateral;  with peritoneal biopsies  . LAPAROSCOPY    . MYRINGOTOMY WITH TUBE PLACEMENT    . TUBAL LIGATION  03/26/2018    Family Psychiatric History: I have reviewed family psychiatric history from my progress note on 09/10/2018.  Family History:  Family History  Adopted: Yes  Family history unknown: Yes    Social History: Reviewed social history from my progress note on 09/10/2018. Social History   Socioeconomic History  . Marital status: Single    Spouse name: Not on file  . Number of children: 0  . Years of education: Not on file  . Highest education level: Some college, no degree  Occupational History  . Occupation: disability for hearing loss  Social Needs  . Financial resource strain: Somewhat hard  . Food insecurity:    Worry: Never true    Inability: Never true   . Transportation needs:    Medical: No    Non-medical: No  Tobacco Use  . Smoking status: Former Smoker    Packs/day: 1.00    Years: 2.00    Pack years: 2.00    Types: Cigarettes    Start date: 11/28/2004    Last attempt to quit: 11/28/2006    Years since quitting: 12.2  . Smokeless tobacco: Never Used  Substance and Sexual Activity  . Alcohol use: Not Currently    Alcohol/week: 0.0 standard drinks    Frequency: Never    Comment: occass  . Drug use: No  . Sexual activity: Not Currently    Birth control/protection: Pill    Comment: Seasonale  Lifestyle  . Physical activity:    Days per week: 7 days    Minutes per session: 150+ min  . Stress: To some extent  Relationships  . Social connections:    Talks on phone: Once a week    Gets together: Never    Attends religious service: Never    Active member of club or organization: No    Attends meetings of clubs or organizations: Never    Relationship status: Never married  Other Topics Concern  . Not on file  Social History Narrative   Lives with her adopted  parents   Hearing loss since birth     Allergies:  Allergies  Allergen Reactions  . Budesonide-Formoterol Fumarate Anaphylaxis and Other (See Comments)    Other reaction(s): Other (See Comments) chest pain Chest pain chest pain  . Astelin [Azelastine]     Tingling, numbness, nausea  . Gabapentin Nausea And Vomiting  . Nortriptyline Other (See Comments)    Other reaction(s): Other (See Comments)  . Diclofenac Other (See Comments)    Other reaction(s): Other (See Comments)  . Duloxetine Hcl Other (See Comments)    Other reaction(s): Other (See Comments)  . Montelukast Sodium Other (See Comments)  . Mupirocin Other (See Comments)  . Naproxen Other (See Comments) and Rash  . Naproxen Sodium Rash    mild rash  . Oxycodone Hcl Rash  . Sulfa Antibiotics Hives and Rash    Metabolic Disorder Labs: Lab Results  Component Value Date   HGBA1C 4.9 01/29/2018    MPG 94 01/29/2018   MPG 97 10/10/2016   No results found for: PROLACTIN Lab Results  Component Value Date   CHOL 131 09/26/2018   TRIG 87 09/26/2018   HDL 45 (L) 09/26/2018   CHOLHDL 2.9 09/26/2018   VLDL 20 10/10/2016   LDLCALC 69 09/26/2018   LDLCALC 195 (H) 06/22/2018   Lab Results  Component Value Date   TSH 2.84 01/29/2018   TSH 1.649 03/02/2016    Therapeutic Level Labs: No results found for: LITHIUM No results found for: VALPROATE No components found for:  CBMZ  Current Medications: Current Outpatient Medications  Medication Sig Dispense Refill  .  acetaminophen (TYLENOL) 500 MG tablet Take 1,000 mg by mouth every 8 (eight) hours as needed for headache.    . benzonatate (TESSALON) 100 MG capsule Take 1-2 capsules (100-200 mg total) by mouth 2 (two) times daily as needed. 40 capsule 0  . BOTOX 100 units SOLR injection     . calcium carbonate (TUMS - DOSED IN MG ELEMENTAL CALCIUM) 500 MG chewable tablet Chew 1 tablet by mouth daily.    . diazepam (VALIUM) 10 MG tablet Insert 1 tablet nightly into vagina as suppository. Can decrease to 3 times weekly if improvement in symptoms 30 tablet 3  . dicyclomine (BENTYL) 20 MG tablet TAKE 1 TABLET BY MOUTH TWICE A DAY AS NEEDED FOR SPASMS 180 tablet 1  . diltiazem (CARDIZEM CD) 120 MG 24 hr capsule TAKE 1 CAPSULE BY MOUTH EVERY DAY 90 capsule 0  . diltiazem (CARDIZEM) 120 MG tablet Take 120 mg by mouth 4 (four) times daily.    . diphenhydrAMINE (BENADRYL) 50 MG tablet Take 50 mg by mouth at bedtime as needed for itching.    Marland Kitchen FOLIC ACID PO Take by mouth.    . metoprolol succinate (TOPROL-XL) 25 MG 24 hr tablet Take 25 mg by mouth daily.    . metoprolol succinate (TOPROL-XL) 25 MG 24 hr tablet TAKE 1 TABLET (25 MG TOTAL) BY MOUTH DAILY. TAKE WITH OR IMMEDIATELY FOLLOWING A MEAL. 90 tablet 0  . norethindrone (AYGESTIN) 5 MG tablet Take 0.5 tablets (2.5 mg total) by mouth daily. Take 21 days than off for 7 each month 30 tablet 6  .  rOPINIRole (REQUIP) 0.25 MG tablet Take 1 tablet (0.25 mg total) by mouth at bedtime. 30 tablet 1  . rosuvastatin (CRESTOR) 20 MG tablet TAKE 1 TABLET BY MOUTH EVERY DAY 90 tablet 1  . SPRINTEC 28 0.25-35 MG-MCG tablet     . venlafaxine XR (EFFEXOR XR) 75 MG 24 hr capsule Take 1 capsule (75 mg total) by mouth daily with breakfast. 30 capsule 1  . vitamin B-12 (CYANOCOBALAMIN) 500 MCG tablet Take 500 mcg by mouth daily.     No current facility-administered medications for this visit.      Musculoskeletal: Strength & Muscle Tone: within normal limits Gait & Station: normal Patient leans: N/A  Psychiatric Specialty Exam: Review of Systems  Psychiatric/Behavioral: Positive for depression. The patient is nervous/anxious and has insomnia.   All other systems reviewed and are negative.   Blood pressure 114/80, pulse 83, temperature 98.5 F (36.9 C), temperature source Oral, weight 167 lb (75.8 kg).Body mass index is 29.58 kg/m.  General Appearance: Casual  Eye Contact:  Fair  Speech:  Clear and Coherent  Volume:  Normal  Mood:  Anxious and Dysphoric  Affect:  Appropriate  Thought Process:  Goal Directed and Descriptions of Associations: Intact  Orientation:  Full (Time, Place, and Person)  Thought Content: Logical   Suicidal Thoughts:  No  Homicidal Thoughts:  No  Memory:  Immediate;   Fair Recent;   Fair Remote;   Fair  Judgement:  Fair  Insight:  Fair  Psychomotor Activity:  Normal  Concentration:  Concentration: Fair and Attention Span: Fair  Recall:  [employer] of Knowledge: Fair  Language: Fair  Akathisia:  No  Handed:  Right  AIMS (if indicated): denies tremors,rigidity,stiffness  Assets:  Communication Skills Desire for Improvement Social Support  ADL's:  Intact  Cognition: WNL  Sleep:  restless   Screenings: GAD-7     Office Visit from 09/25/2018  in [facility] Office Visit from 06/22/2018 in [facility] Office Visit from  03/19/2018 in [facility] Office Visit from 01/29/2018 in [facility]  Total GAD-7 Score  PHQ2-9     Office Visit from 09/25/2018 in [facility] Office Visit from 06/22/2018 in [facility] Office Visit from 03/19/2018 in [facility] Office Visit from 01/29/2018 in [facility] Office Visit from 04/11/2017 in [facility]  PHQ-2 Total Score  PHQ-9 Total Score  -       Assessment and Plan: Najmah is a 33 year old female who is single, unemployed, lives in [place], has a history of depression, anxiety, multiple medical problems, is deaf and mute, presented to clinic today for a follow-up visit.  Patient continues to struggle with depression and sleep problems.  Patient is biologically predisposed given her history of trauma.  She also has psychosocial stressors of multiple health problems.  Patient will benefit from medication changes since she continues to struggle with mood symptoms as well as sleep issues.  Plan For MDD-unstable Increase Effexor extended release to 75 mg p.o. daily with breakfast Continue l-methylfolate 7.5 mg p.o. daily for folate conversion reduction.  For social anxiety disorder-unstable Continue CBT with Ms. [name].  For insomnia-unstable Patient has stopped taking Sonata as well as melatonin. Start Requip 0.25 mg p.o. nightly for restless legs.  Gene[employer] testing results were made use of while making medication changes today.  ASL interpreter was made use of for doing the evaluation today.  Follow-up in clinic in 3 to 4 weeks or sooner if needed.  I have spent atleast 25 minutes face to face with patient today. More than 50 % of the time was spent for psychoeducation and supportive psychotherapy and care coordination.  This note was generated in part or whole with voice recognition  software. Voice recognition is usually quite accurate but there are transcription errors that can and very often do occur. I apologize for any typographical errors that were not detected and corrected.        Jomarie Longs, MD 02/07/2019, 1:54 PM

## 2019-02-08 ENCOUNTER — Other Ambulatory Visit: Payer: Self-pay | Admitting: Psychiatry

## 2019-02-08 ENCOUNTER — Ambulatory Visit: Payer: Medicare Other | Admitting: Licensed Clinical Social Worker

## 2019-02-08 DIAGNOSIS — F4010 Social phobia, unspecified: Secondary | ICD-10-CM

## 2019-02-08 DIAGNOSIS — F5105 Insomnia due to other mental disorder: Secondary | ICD-10-CM

## 2019-02-09 ENCOUNTER — Other Ambulatory Visit: Payer: Self-pay | Admitting: Psychiatry

## 2019-02-09 ENCOUNTER — Encounter: Payer: Self-pay | Admitting: Obstetrics and Gynecology

## 2019-02-09 DIAGNOSIS — F331 Major depressive disorder, recurrent, moderate: Secondary | ICD-10-CM

## 2019-02-09 DIAGNOSIS — F4010 Social phobia, unspecified: Secondary | ICD-10-CM

## 2019-02-09 NOTE — Progress Notes (Signed)
    GYNECOLOGY PROGRESS NOTE  Subjective:    Patient ID: Yesenia Wood, female    DOB: [birth year], 33 y.o.   MRN: [id number]  HPI  Patient is a 33 y.o. G0P0000 female who presents 6 week for follow up of pelvic pain and dysfunctional uterine bleeding (symptoms suspicious for endometriosis, however negative laparoscopic findings).  She was initiated on Aygestin 5 mg last visit. She notes that the her bleeding had improved, but the pain was still present.  Also noted side effects including significant bloating, so she discontinued after 3-4 weeks of use.  LMP was 01/25/19.  Hospital-provided sign language interpreter present for today's visit.   The following portions of the patient's history were reviewed and updated as appropriate: allergies, current medications, past family history, past medical history, past social history, past surgical history and problem list.  Review of Systems Pertinent items noted in HPI and remainder of comprehensive ROS otherwise negative.   Objective:   Blood pressure 112/78, pulse 88, height 5\' 3"  (1.6 m), weight 166 lb 11.2 oz (75.6 kg). General appearance: alert and no distress Abdomen: soft, non-tender; bowel sounds normal; no masses,  no organomegaly Pelvic: deferred  Assessment:   Pelvic pain Dysfunctional uterine bleeding  Plan:   Patient has noted some improvement in symptoms (more specifically bleeding), however was noting significant side effects of bloating. Discussed option of decreasing dose to 2.5 mg and doing 21 days on, 7 days off.   Patient advised on continuing current pain management regimen as needed.  Will try medication at decreased dose for 1 additional month.   Hildred Laser, MD [facility]

## 2019-02-19 ENCOUNTER — Encounter: Payer: Self-pay | Admitting: Physical Therapy

## 2019-02-19 ENCOUNTER — Telehealth: Payer: Self-pay | Admitting: Physical Therapy

## 2019-02-19 NOTE — Therapy (Signed)
[facility] [address] [place], [place], [address] Phone: [phone]   Fax:  [phone]  Patient Details  Name: Yesenia Wood MRN: [id number] Date of Birth: October 13, 1986 Referring Provider: Dr. Marcelline Mates  Encounter Date: 02/19/2019   DISCHARGE NOTE   Across 24 visits from 12/11/17  to  02/06/19, pt has achieved 2/3 goals. Pt demonstrated IND with HEP, decreased mm tightness, and improved body mechanics to minimize back strain and overuse of pelvic floor [place]. Pt is ready for d/c at this time.     PT Long Term Goals - 02/06/19 1138      PT LONG TERM GOAL #1   Title  Patient will score at or below 8/43 on the Female NIH-CPSI and 5 on the VQ to demonstrate a clinically meaningful decrease in disability and distress due to pelvic floor dysfunction.    Baseline  NIH- CPSI: 18/43, VQ: 10/33 As of 12/9: NIH- CPSI: 12/43, VQ: 15/33    Time  10    Period  Weeks    Status Unable to reassess     PT LONG TERM GOAL #2   Title  Patient will demonstrate ability to use a tool for self TP release to allow for maintenence of spasm release upon D/C.    Time  10    Period  Weeks    Status  Deferred      PT LONG TERM GOAL #3   Title  Patient will report a reduction in pain to no greater than 2/10 over the prior week to demonstrate symptom improvement.    Baseline  As of 12/9: Max of 5/10 over past 2 weeks , Mar 2020: 2-3 days 2/10    Time  10    Period  Weeks    Status Partially Met      PT LONG TERM GOAL #4   Title  Patient will demonstrate improved sitting and standing posture to demonstrate learning and decrease stress on the pelvic floor with functional activity.    Baseline  Pt. demonstrates improved posture ~ 85% of the time.    Time  10    Period  Weeks    Status  Achieved      PT LONG TERM GOAL #5   Title  Patient will perform regular exercise over the prior 2 weeks to demonstrate improved QOL and  ability to maintain symptom improvement upon D/C    Time  10    Period  Weeks    Status  Achieved         Jerl Mina ,PT, DPT, E-RYT  02/19/2019, 2:02 PM  [place] [address] [place], [place], [address] Phone: [phone]   Fax:  [phone]

## 2019-02-19 NOTE — Telephone Encounter (Signed)
DPT spoke with pt through an interpreter re: closure of Rehab services for 2 weeks due to COVID-19 pandemic and therefore cancelling her last appt this week. Reviewed discussion at last visit re: plan to d/c. Pt reported she has had 2-3 days that were good with low pain levels of 2/10. Then it will go up to 4-5/10 and she thinks it is due to the weather.Overall, her pain is "more stable recently".  Advised pt to contact our office if she needs PT in the future with a MD order.

## 2019-02-20 ENCOUNTER — Ambulatory Visit: Payer: Medicare Other | Admitting: Physical Therapy

## 2019-02-22 ENCOUNTER — Telehealth: Payer: Self-pay | Admitting: *Deleted

## 2019-02-22 NOTE — Telephone Encounter (Signed)
Spoke with the patient. She stated that she is unable to do an evisit either by phone or web due to needing a sign language interpretor.  She will call back on Monday to decide whether or not she wants to be seen or if she will reschedule.

## 2019-02-26 ENCOUNTER — Ambulatory Visit: Payer: Medicare Other | Admitting: Cardiovascular Disease

## 2019-02-27 ENCOUNTER — Encounter: Payer: Medicare Other | Admitting: Physical Therapy

## 2019-03-01 ENCOUNTER — Other Ambulatory Visit: Payer: Self-pay | Admitting: Psychiatry

## 2019-03-01 DIAGNOSIS — F331 Major depressive disorder, recurrent, moderate: Secondary | ICD-10-CM

## 2019-03-01 DIAGNOSIS — F4010 Social phobia, unspecified: Secondary | ICD-10-CM

## 2019-03-05 ENCOUNTER — Ambulatory Visit: Payer: Medicare Other | Admitting: Licensed Clinical Social Worker

## 2019-03-05 ENCOUNTER — Telehealth: Payer: Self-pay | Admitting: Psychiatry

## 2019-03-05 DIAGNOSIS — F4010 Social phobia, unspecified: Secondary | ICD-10-CM

## 2019-03-05 DIAGNOSIS — F331 Major depressive disorder, recurrent, moderate: Secondary | ICD-10-CM

## 2019-03-05 MED ORDER — ROPINIROLE HCL 0.25 MG PO TABS: 0.2500 mg | ORAL_TABLET | Freq: Every day | ORAL | 1 refills | Status: DC

## 2019-03-05 NOTE — Telephone Encounter (Signed)
Sent 90 days supply to pharmacy

## 2019-03-13 ENCOUNTER — Telehealth: Payer: Self-pay | Admitting: Obstetrics and Gynecology

## 2019-03-13 ENCOUNTER — Encounter: Payer: Self-pay | Admitting: Obstetrics and Gynecology

## 2019-03-13 ENCOUNTER — Encounter: Payer: Medicare Other | Admitting: Physical Therapy

## 2019-03-13 NOTE — Telephone Encounter (Signed)
Pulled up pt's chart, due to interpreter coming in for patient's apt, that was canceled. Thank you.

## 2019-03-15 ENCOUNTER — Ambulatory Visit: Payer: Medicare Other | Admitting: Psychiatry

## 2019-03-21 ENCOUNTER — Other Ambulatory Visit: Payer: Self-pay | Admitting: Obstetrics and Gynecology

## 2019-03-27 ENCOUNTER — Encounter: Payer: Medicare Other | Admitting: Physical Therapy

## 2019-04-21 ENCOUNTER — Other Ambulatory Visit: Payer: Self-pay | Admitting: Psychiatry

## 2019-04-21 DIAGNOSIS — F331 Major depressive disorder, recurrent, moderate: Secondary | ICD-10-CM

## 2019-04-21 DIAGNOSIS — F4010 Social phobia, unspecified: Secondary | ICD-10-CM

## 2019-04-23 ENCOUNTER — Telehealth: Payer: Self-pay | Admitting: Cardiovascular Disease

## 2019-04-23 ENCOUNTER — Other Ambulatory Visit: Payer: Self-pay | Admitting: Cardiovascular Disease

## 2019-04-23 NOTE — Telephone Encounter (Signed)
Virtual Visit Pre-Appointment Phone Call  "(Name), I am calling you today to discuss your upcoming appointment. We are currently trying to limit exposure to the virus that causes COVID-19 by seeing patients at home rather than in the office."  1. "What is the BEST phone number to call the day of the visit?" - include this in appointment notes  2. Do you have or have access to (through a family member/friend) a smartphone with video capability that we can use for your visit?" a. If yes - list this number in appt notes as cell (if different from BEST phone #) and list the appointment type as a VIDEO visit in appointment notes b. If no - list the appointment type as a PHONE visit in appointment notes  3. Confirm consent - "In the setting of the current Covid19 crisis, you are scheduled for a (phone or video) visit with your provider on (date) at (time).  Just as we do with many in-office visits, in order for you to participate in this visit, we must obtain consent.  If you'd like, I can send this to your mychart (if signed up) or email for you to review.  Otherwise, I can obtain your verbal consent now.  All virtual visits are billed to your insurance company just like a normal visit would be.  By agreeing to a virtual visit, we'd like you to understand that the technology does not allow for your provider to perform an examination, and thus may limit your provider's ability to fully assess your condition. If your provider identifies any concerns that need to be evaluated in person, we will make arrangements to do so.  Finally, though the technology is pretty good, we cannot assure that it will always work on either your or our end, and in the setting of a video visit, we may have to convert it to a phone-only visit.  In either situation, we cannot ensure that we have a secure connection.  Are you willing to proceed?" STAFF: Did the patient verbally acknowledge consent to telehealth visit? Document  YES/NO here: yes via ASL Language line   4. Advise patient to be prepared - "Two hours prior to your appointment, go ahead and check your blood pressure, pulse, oxygen saturation, and your weight (if you have the equipment to check those) and write them all down. When your visit starts, your provider will ask you for this information. If you have an Apple Watch or Kardia device, please plan to have heart rate information ready on the day of your appointment. Please have a pen and paper handy nearby the day of the visit as well."  5. Give patient instructions for MyChart download to smartphone OR Doximity/Doxy.me as below if video visit (depending on what platform provider is using)  6. Inform patient they will receive a phone call 15 minutes prior to their appointment time (may be from unknown caller ID) so they should be prepared to answer    TELEPHONE CALL NOTE  Yesenia Wood has been deemed a candidate for a follow-up tele-health visit to limit community exposure during the Covid-19 pandemic. I spoke with the patient via phone to ensure availability of phone/video source, confirm preferred email & phone number, and discuss instructions and expectations.  I reminded Yesenia Wood to be prepared with any vital sign and/or heart rhythm information that could potentially be obtained via home monitoring, at the time of her visit. I reminded Yesenia Wood to expect  a phone call prior to her visit.  Joline Maxcy 04/23/2019 10:11 AM   INSTRUCTIONS FOR DOWNLOADING THE MYCHART APP TO SMARTPHONE  - The patient must first make sure to have activated MyChart and know their login information - If Apple, go to [employer] and type in MyChart in the search bar and download the app. If Android, ask patient to go to [employer] and type in [place] in the search bar and download the app. The app is free but as with any other app downloads, their phone may require them to verify saved payment  information or Apple/Android password.  - The patient will need to then log into the app with their MyChart username and password, and select [facility] as their healthcare provider to link the account. When it is time for your visit, go to the MyChart app, find appointments, and click Begin Video Visit. Be sure to Select Allow for your device to access the Microphone and Camera for your visit. You will then be connected, and your provider will be with you shortly.  **If they have any issues connecting, or need assistance please contact MyChart service desk [phone])**  **If using a computer, in order to ensure the best quality for their visit they will need to use either of the following Internet Browsers: [employer] or Google Chrome**  IF USING DOXIMITY or DOXY.ME - The patient will receive a link just prior to their visit by text.     FULL LENGTH CONSENT FOR TELE-HEALTH VISIT   I hereby voluntarily request, consent and authorize [facility] and its employed or contracted physicians, physician assistants, nurse practitioners or other licensed health care professionals (the Practitioner), to provide me with telemedicine health care services (the Services") as deemed necessary by the treating Practitioner. I acknowledge and consent to receive the Services by the Practitioner via telemedicine. I understand that the telemedicine visit will involve communicating with the Practitioner through live audiovisual communication technology and the disclosure of certain medical information by electronic transmission. I acknowledge that I have been given the opportunity to request an in-person assessment or other available alternative prior to the telemedicine visit and am voluntarily participating in the telemedicine visit.  I understand that I have the right to withhold or withdraw my consent to the use of telemedicine in the course of my care at any time, without affecting my right  to future care or treatment, and that the Practitioner or I may terminate the telemedicine visit at any time. I understand that I have the right to inspect all information obtained and/or recorded in the course of the telemedicine visit and may receive copies of available information for a reasonable fee.  I understand that some of the potential risks of receiving the Services via telemedicine include:   Delay or interruption in medical evaluation due to technological equipment failure or disruption;  Information transmitted may not be sufficient (e.g. poor resolution of images) to allow for appropriate medical decision making by the Practitioner; and/or   In rare instances, security protocols could fail, causing a breach of personal health information.  Furthermore, I acknowledge that it is my responsibility to provide information about my medical history, conditions and care that is complete and accurate to the best of my ability. I acknowledge that Practitioner's advice, recommendations, and/or decision may be based on factors not within their control, such as incomplete or inaccurate data provided by me or distortions of diagnostic images or specimens that may  result from electronic transmissions. I understand that the practice of medicine is not an exact science and that Practitioner makes no warranties or guarantees regarding treatment outcomes. I acknowledge that I will receive a copy of this consent concurrently upon execution via email to the email address I last provided but may also request a printed copy by calling the office of [facility].    I understand that my insurance will be billed for this visit.   I have read or had this consent read to me.  I understand the contents of this consent, which adequately explains the benefits and risks of the Services being provided via telemedicine.   I have been provided ample opportunity to ask questions regarding this consent and the Services  and have had my questions answered to my satisfaction.  I give my informed consent for the services to be provided through the use of telemedicine in my medical care  By participating in this telemedicine visit I agree to the above.

## 2019-04-25 ENCOUNTER — Other Ambulatory Visit: Payer: Self-pay

## 2019-04-25 ENCOUNTER — Encounter: Payer: Self-pay | Admitting: Cardiovascular Disease

## 2019-04-25 ENCOUNTER — Telehealth (INDEPENDENT_AMBULATORY_CARE_PROVIDER_SITE_OTHER): Payer: Medicare Other | Admitting: Cardiovascular Disease

## 2019-04-25 VITALS — BP 130/81 | HR 98 | Ht 63.0 in | Wt 160.0 lb

## 2019-04-25 DIAGNOSIS — R000 Tachycardia, unspecified: Secondary | ICD-10-CM | POA: Diagnosis not present

## 2019-04-25 MED ORDER — METOPROLOL SUCCINATE ER 50 MG PO TB24: 50.0000 mg | ORAL_TABLET | Freq: Every day | ORAL | 2 refills | Status: DC

## 2019-04-25 MED ORDER — DILTIAZEM HCL ER COATED BEADS 120 MG PO CP24: 120.0000 mg | ORAL_CAPSULE | Freq: Every day | ORAL | 2 refills | Status: DC

## 2019-04-25 NOTE — Patient Instructions (Signed)
Medication Instructions:  Increase Toprol to 50 mg once daily.  Continue other medications  If you need a refill on your cardiac medications before your next appointment, please call your pharmacy.   Lab work: None If you have labs (blood work) drawn today and your tests are completely normal, you will receive your results only by: Marland Kitchen MyChart Message (if you have MyChart) OR . A paper copy in the mail If you have any lab test that is abnormal or we need to change your treatment, we will call you to review the results.  Testing/Procedures: None  Follow-Up: At [facility], you and your health needs are our priority.  As part of our continuing mission to provide you with exceptional heart care, we have created designated Provider Care Teams.  These Care Teams include your primary Cardiologist (physician) and Advanced Practice Providers (APPs -  Physician Assistants and Nurse Practitioners) who all work together to provide you with the care you need, when you need it. You will need a follow up appointment in 4 months.  Please call our office 2 months in advance to schedule this appointment.  You may see Lorine Bears, MD or one of the following Advanced Practice Providers on your designated Care Team:   Nicolasa Ducking, NP Eula Listen, PA-C . Marisue Ivan, PA-C

## 2019-04-25 NOTE — Progress Notes (Signed)
Virtual Visit via Telephone Note   This visit type was conducted due to national recommendations for restrictions regarding the COVID-19 Pandemic (e.g. social distancing) in an effort to limit this patient's exposure and mitigate transmission in our community.  Due to her co-morbid illnesses, this patient is at least at moderate risk for complications without adequate follow up.  This format is felt to be most appropriate for this patient at this time.  The patient did not have access to video technology/had technical difficulties with video requiring transitioning to audio format only (telephone).  All issues noted in this document were discussed and addressed.  No physical exam could be performed with this format.  Please refer to the patient's chart for her  consent to [facility].   Date:  04/25/2019   ID:  Yesenia Wood, DOB 09/28/1986, MRN [id number]  Patient Location: Home Provider Location: Office  PCP:  Alba CorySowles, Krichna, MD  Cardiologist:  Lorine BearsMuhammad Nico Syme, MD  Electrophysiologist:  None   Evaluation Performed:  Follow-Up Visit  Chief Complaint: Palpitations  History of Present Illness:    Yesenia Wood is a 33 y.o. female was reached via phone for follow-up regarding inappropriate sinus tachycardia.  This was done via the interpreter line.  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  Prior echo in [place] 2015 was completely normal. She underwent treadmill stress testing 06/23/2015 that was normal. However, her HR and BP were elevated at peak exercise, thus she was started on extended release Cardizem. She also wore a 48-hour Holter monitor given her palpitations that showed sinus tachycardia.  She was treated with diltiazem but did not respond very well and continued to have tachycardia.  Given her asthma, I was initially hesitant to  start a beta-blocker but ultimately we started small dose Toprol 25 mg once daily.  Since then, she reports significant improvement in symptoms.  She feels that Toprol worked better than diltiazem.  Her asthma did not worsen.  The patient does not have symptoms concerning for COVID-19 infection (fever, chills, cough, or new shortness of breath).    Past Medical History:  Diagnosis Date  . ADHD (attention deficit hyperactivity disorder)   . B12 deficiency   . Bell's palsy    right sided  . Chronic migraine   . Deaf   . Depression   . 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 Fatigue   . Fatty liver   . Fibromyalgia   . GERD (gastroesophageal reflux disease)   . Headache   . High triglycerides   . Hyperglycemia   . Hypertension   . Hyperthyroidism   . Moderate asthma   . Obesity   . Paresthesia   . Premature birth    Past Surgical History:  Procedure Laterality Date  . BARTHOLIN GLAND CYST REMOVAL    . COCHLEAR IMPLANT    . COCHLEAR IMPLANT REMOVAL    . LAPAROSCOPIC TUBAL LIGATION Bilateral 03/26/2018   Procedure: LAPAROSCOPIC TUBAL LIGATION;  Surgeon: Hildred Laserherry, Anika, MD;  Location: [facility] ORS;  Service: Gynecology;  Laterality: Bilateral;  with peritoneal biopsies  . LAPAROSCOPY    . MYRINGOTOMY WITH TUBE PLACEMENT    . TUBAL LIGATION  03/26/2018     Current Meds  Medication Sig  . BOTOX 100 units SOLR injection every 3 (three) months.   . calcium carbonate (TUMS -  DOSED IN MG ELEMENTAL CALCIUM) 500 MG chewable tablet Chew 1 tablet by mouth daily as needed.   . diltiazem (CARDIZEM CD) 120 MG 24 hr capsule Take 1 capsule (120 mg total) by mouth daily.  . diphenhydrAMINE (BENADRYL) 50 MG tablet Take 50 mg by mouth at bedtime as needed for itching.  . diphenhydrAMINE-APAP, sleep, (MIDOL PM PO) Take by mouth as needed.  Marland Kitchen FOLIC ACID PO Take by mouth.  . metoprolol succinate (TOPROL-XL) 50 MG 24 hr tablet Take 1 tablet (50 mg  total) by mouth daily.  . norethindrone (AYGESTIN) 5 MG tablet TAKE 1 TABLET BY MOUTH EVERY DAY  . rosuvastatin (CRESTOR) 20 MG tablet TAKE 1 TABLET BY MOUTH EVERY DAY  . venlafaxine XR (EFFEXOR-XR) 75 MG 24 hr capsule TAKE 1 CAPSULE BY MOUTH EVERY DAY WITH BREAKFAST  . [DISCONTINUED] diltiazem (CARDIZEM CD) 120 MG 24 hr capsule TAKE 1 CAPSULE BY MOUTH EVERY DAY  . [DISCONTINUED] metoprolol succinate (TOPROL-XL) 25 MG 24 hr tablet Take 25 mg by mouth daily.     Allergies:   Budesonide-formoterol fumarate; Astelin [azelastine]; Gabapentin; Nortriptyline; Diclofenac; Duloxetine hcl; Montelukast sodium; Mupirocin; Naproxen; Naproxen sodium; Oxycodone hcl; and Sulfa antibiotics   Social History   Tobacco Use  . Smoking status: Former Smoker    Packs/day: 1.00    Years: 2.00    Pack years: 2.00    Types: Cigarettes    Start date: 11/28/2004    Last attempt to quit: 11/28/2006    Years since quitting: 12.4  . Smokeless tobacco: Never Used  Substance Use Topics  . Alcohol use: Not Currently    Alcohol/week: 0.0 standard drinks    Frequency: Never    Comment: occass  . Drug use: No     Family Hx: The patient's She was adopted. Family history is unknown by patient.  ROS:   Please see the history of present illness.     All other systems reviewed and are negative.   Prior CV studies:   The following studies were reviewed today:    Labs/Other Tests and Data Reviewed:    EKG:  No ECG reviewed.  Recent Labs: 09/26/2018: ALT 36; BUN 12; Creat 0.81; Potassium 4.1; Sodium 139   Recent Lipid Panel Lab Results  Component Value Date/Time   CHOL 131 09/26/2018 11:58 AM   CHOL 192 10/29/2015 10:50 AM   TRIG 87 09/26/2018 11:58 AM   HDL 45 (L) 09/26/2018 11:58 AM   HDL 43 10/29/2015 10:50 AM   CHOLHDL 2.9 09/26/2018 11:58 AM   LDLCALC 69 09/26/2018 11:58 AM    Wt Readings from Last 3 Encounters:  04/25/19 160 lb (72.6 kg)  02/07/19 166 lb 11.2 oz (75.6 kg)  12/25/18 154 lb 4.8  oz (70 kg)     Objective:    Vital Signs:  BP 130/81   Pulse 98   Ht 5\' 3"  (1.6 m)   Wt 160 lb (72.6 kg)   BMI 28.34 kg/m    VITAL SIGNS:  reviewed  ASSESSMENT & PLAN:    1.  Inappropriate sinus tachycardia: She responded better to Toprol than diltiazem.  Given residual symptoms, I elected to increase Toprol to 50 mg once daily.  Continue current dose of diltiazem extended release 120 mg once daily.  We might ultimately consider stopping diltiazem.    2. Shortness of breath:   This has been stable overall and if anything seems to be a little bit better.  COVID-19 Education: The signs and symptoms of  COVID-19 were discussed with the patient and how to seek care for testing (follow up with PCP or arrange E-visit).  The importance of social distancing was discussed today.  Time:   Today, I have spent 9 minutes with the patient with telehealth technology discussing the above problems.     Medication Adjustments/Labs and Tests Ordered: Current medicines are reviewed at length with the patient today.  Concerns regarding medicines are outlined above.   Tests Ordered: No orders of the defined types were placed in this encounter.   Medication Changes: Meds ordered this encounter  Medications  . metoprolol succinate (TOPROL-XL) 50 MG 24 hr tablet    Sig: Take 1 tablet (50 mg total) by mouth daily.    Dispense:  90 tablet    Refill:  2  . diltiazem (CARDIZEM CD) 120 MG 24 hr capsule    Sig: Take 1 capsule (120 mg total) by mouth daily.    Dispense:  90 capsule    Refill:  2    Disposition:  Follow up in 4 month(s)  Signed, Lorine Bears, MD  04/25/2019 10:39 AM    [facility]

## 2019-04-30 DIAGNOSIS — G43719 Chronic migraine without aura, intractable, without status migrainosus: Secondary | ICD-10-CM | POA: Diagnosis not present

## 2019-04-30 DIAGNOSIS — G43111 Migraine with aura, intractable, with status migrainosus: Secondary | ICD-10-CM | POA: Diagnosis not present

## 2019-04-30 DIAGNOSIS — G43109 Migraine with aura, not intractable, without status migrainosus: Secondary | ICD-10-CM | POA: Diagnosis not present

## 2019-04-30 DIAGNOSIS — M542 Cervicalgia: Secondary | ICD-10-CM | POA: Diagnosis not present

## 2019-04-30 DIAGNOSIS — G518 Other disorders of facial nerve: Secondary | ICD-10-CM | POA: Diagnosis not present

## 2019-04-30 DIAGNOSIS — M791 Myalgia, unspecified site: Secondary | ICD-10-CM | POA: Diagnosis not present

## 2019-05-25 ENCOUNTER — Other Ambulatory Visit: Payer: Self-pay | Admitting: Psychiatry

## 2019-05-25 DIAGNOSIS — F331 Major depressive disorder, recurrent, moderate: Secondary | ICD-10-CM

## 2019-05-25 DIAGNOSIS — F4010 Social phobia, unspecified: Secondary | ICD-10-CM

## 2019-05-29 ENCOUNTER — Telehealth: Payer: Self-pay | Admitting: Psychiatry

## 2019-05-29 DIAGNOSIS — F4010 Social phobia, unspecified: Secondary | ICD-10-CM

## 2019-05-29 DIAGNOSIS — F331 Major depressive disorder, recurrent, moderate: Secondary | ICD-10-CM

## 2019-05-29 DIAGNOSIS — F5105 Insomnia due to other mental disorder: Secondary | ICD-10-CM

## 2019-05-29 MED ORDER — VENLAFAXINE HCL ER 150 MG PO CP24: 150.0000 mg | ORAL_CAPSULE | Freq: Every day | ORAL | 0 refills | Status: DC

## 2019-05-29 NOTE — Telephone Encounter (Signed)
Spoke to patient with interpreter available. Discussed increasing Effexor xr to 150 mg since not effective at this dose. Discussed to make an appt with me and Merleen Nicely. She will call front desk.

## 2019-06-02 ENCOUNTER — Other Ambulatory Visit: Payer: Self-pay | Admitting: Family Medicine

## 2019-06-10 DIAGNOSIS — G518 Other disorders of facial nerve: Secondary | ICD-10-CM | POA: Diagnosis not present

## 2019-06-10 DIAGNOSIS — G43111 Migraine with aura, intractable, with status migrainosus: Secondary | ICD-10-CM | POA: Diagnosis not present

## 2019-06-10 DIAGNOSIS — M542 Cervicalgia: Secondary | ICD-10-CM | POA: Diagnosis not present

## 2019-06-10 DIAGNOSIS — G43109 Migraine with aura, not intractable, without status migrainosus: Secondary | ICD-10-CM | POA: Diagnosis not present

## 2019-06-10 DIAGNOSIS — G43719 Chronic migraine without aura, intractable, without status migrainosus: Secondary | ICD-10-CM | POA: Diagnosis not present

## 2019-06-10 DIAGNOSIS — M791 Myalgia, unspecified site: Secondary | ICD-10-CM | POA: Diagnosis not present

## 2019-06-21 ENCOUNTER — Ambulatory Visit: Payer: Medicare Other | Admitting: Licensed Clinical Social Worker

## 2019-07-01 ENCOUNTER — Ambulatory Visit (INDEPENDENT_AMBULATORY_CARE_PROVIDER_SITE_OTHER): Payer: Medicare Other | Admitting: Licensed Clinical Social Worker

## 2019-07-01 ENCOUNTER — Other Ambulatory Visit: Payer: Self-pay

## 2019-07-01 ENCOUNTER — Encounter: Payer: Self-pay | Admitting: Licensed Clinical Social Worker

## 2019-07-01 DIAGNOSIS — F331 Major depressive disorder, recurrent, moderate: Secondary | ICD-10-CM

## 2019-07-01 NOTE — Progress Notes (Signed)
Virtual Visit via Video Note  I connected with Yesenia Wood on 07/01/19 at  2:30 PM EDT by a video enabled telemedicine application and verified that I am speaking with the correct person using two identifiers.   I discussed the limitations of evaluation and management by telemedicine and the availability of in person appointments. The patient expressed understanding and agreed to proceed.  I discussed the assessment and treatment plan with the patient. The patient was provided an opportunity to ask questions and all were answered. The patient agreed with the plan and demonstrated an understanding of the instructions.   The patient was advised to call back or seek an in-person evaluation if the symptoms worsen or if the condition fails to improve as anticipated.  I provided 60 minutes of non-face-to-face time during this encounter.   Alden Hipp, LCSW    THERAPIST PROGRESS NOTE  Session Time: 1430  Participation Level: Active  Behavioral Response: NeatAlertAnxious  Type of Therapy: Individual Therapy  Treatment Goals addressed: Anxiety  Interventions: CBT  Summary: Yesenia Wood is a 33 y.o. female who presents with continued symptoms related to her diagnosis. Da was joined by her interpreter for our session. Hlee reports doing well since our last session. She reports for 2-3 months, she was doing very well and managing her anxiety. She reports over the last two months, she has felt more and more anxious and depressed. "I think it's because my interactions are so limited now because of COVID." LCSW validated and normalized those feelings and highlighted that this is a common problem for most people at the moment--in an effort to allow Albie to feel less isolated in her mental health. Mitsy expressed relief others were feeling as she is, and reported she has tried to participate in a chat room for deaf people; however, she noted she did not like the group because they made  her feel she wasn't "deaf enough." We discussed how, at times, people tend to "other," people to make them feel less than--which is most often a result of something negative in that person's life. We discussed Sherica's identity, and how it is more than just being deaf. Lawan stated she will make a list of her personality traits and things she is interested in, and find groups based on that. LCSW validated this idea and encouraged Adelaine to follow through with it. Megha noted that is another thing she has found difficult to follow through on tasks. "sometimes I feel guilty for sitting down and watching a movie." LCSW encouraged Lilli to allow herself down time, and when she begins to feel guilty, to challenge those thoughts. Derotha expressed understanding and agreement with this idea as well.   Suicidal/Homicidal: No  Therapist Response: Dezarae continues to work towards her tx goals but has not yet reached them. We will continue to work on emotional regulation skills and managing anxiety.   Plan: Return again in 3 weeks.  Diagnosis: Axis I: Major Depression, Recurrent severe    Axis II: No diagnosis    Alden Hipp, LCSW 07/01/2019

## 2019-07-17 ENCOUNTER — Ambulatory Visit (INDEPENDENT_AMBULATORY_CARE_PROVIDER_SITE_OTHER): Payer: Medicare Other | Admitting: Psychiatry

## 2019-07-17 ENCOUNTER — Encounter: Payer: Self-pay | Admitting: Psychiatry

## 2019-07-17 ENCOUNTER — Other Ambulatory Visit: Payer: Self-pay

## 2019-07-17 DIAGNOSIS — F5105 Insomnia due to other mental disorder: Secondary | ICD-10-CM | POA: Diagnosis not present

## 2019-07-17 DIAGNOSIS — F331 Major depressive disorder, recurrent, moderate: Secondary | ICD-10-CM

## 2019-07-17 DIAGNOSIS — F4010 Social phobia, unspecified: Secondary | ICD-10-CM

## 2019-07-17 MED ORDER — VENLAFAXINE HCL 75 MG PO TABS: 75.0000 mg | ORAL_TABLET | Freq: Two times a day (BID) | ORAL | 0 refills | Status: DC

## 2019-07-17 NOTE — Progress Notes (Signed)
Virtual Visit via Telephone Note  I connected with Yesenia Wood on 07/17/19 at 11:00 AM EDT by telephone and verified that I am speaking with the correct person using two identifiers.   I discussed the limitations, risks, security and privacy concerns of performing an evaluation and management service by telephone and the availability of in person appointments. I also discussed with the patient that there may be a patient responsible charge related to this service. The patient expressed understanding and agreed to proceed.   I discussed the assessment and treatment plan with the patient. The patient was provided an opportunity to ask questions and all were answered. The patient agreed with the plan and demonstrated an understanding of the instructions.   The patient was advised to call back or seek an in-person evaluation if the symptoms worsen or if the condition fails to improve as anticipated.   BH MD OP Progress Note  07/17/2019 5:28 PM Yesenia Wood  MRN:  [id number]  Chief Complaint:  Chief Complaint    Follow-up     HPI: Yesenia Wood is a 33 year old Caucasian female, unemployed, single, lives in [place], has a history of depression, anxiety, insomnia, migraine headaches, vitamin B12 deficiency, bilateral hearing loss, tachycardia, is mute, was evaluated by phone today.  Phone interpreter was used while during the evaluation today.  Patient today reports she continues to struggle with side effects to the venlafaxine.  She reports the venlafaxine was initially helpful.  She however reports that she currently struggles with constipation and sleepiness during the day.  She also does not know if the venlafaxine is helping much with her depressive symptoms.  She often feels tired during the day.  She reports sleep is okay most nights.  She reports she continues to work with her therapist which is helpful with her anxiety symptoms.  She denies any suicidality, homicidality or perceptual  disturbances.  She denies any other concerns today.  Visit Diagnosis:    ICD-10-CM   1. MDD (major depressive disorder), recurrent episode, moderate (HCC)  F33.1 venlafaxine (EFFEXOR) 75 MG tablet  2. Social anxiety disorder  F40.10 venlafaxine (EFFEXOR) 75 MG tablet  3. Insomnia due to mental condition  F51.05     Past Psychiatric History: I have reviewed past psychiatric history from my progress note on 09/10/2018.  Past trials of Zoloft, Cymbalta, Pamelor, gabapentin, Abilify, Belsomra, hydroxyzine, trazodone, Ambien, Remeron, Sonata  Past Medical History:  Past Medical History:  Diagnosis Date  . ADHD (attention deficit hyperactivity disorder)   . B12 deficiency   . Bell's palsy    right sided  . Chronic migraine   . Deaf   . Depression   . 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 Fatigue   . Fatty liver   . Fibromyalgia   . GERD (gastroesophageal reflux disease)   . Headache   . High triglycerides   . Hyperglycemia   . Hypertension   . Hyperthyroidism   . Moderate asthma   . Obesity   . Paresthesia   . Premature birth     Past Surgical History:  Procedure Laterality Date  . BARTHOLIN GLAND CYST REMOVAL    . COCHLEAR IMPLANT    . COCHLEAR IMPLANT REMOVAL    . LAPAROSCOPIC TUBAL LIGATION Bilateral 03/26/2018   Procedure: LAPAROSCOPIC TUBAL LIGATION;  Surgeon: Hildred Laserherry, Anika, MD;  Location: [facility] ORS;  Service: Gynecology;  Laterality: Bilateral;  with peritoneal biopsies  . LAPAROSCOPY    .  MYRINGOTOMY WITH TUBE PLACEMENT    . TUBAL LIGATION  03/26/2018    Family Psychiatric History: I have reviewed family psychiatric history from my progress note on 09/10/2018. Family History:  Family History  Adopted: Yes  Family history unknown: Yes    Social History: I have reviewed social history from my progress note on 09/10/2018. Social History   Socioeconomic History  . Marital status: Single    Spouse name: Not  on file  . Number of children: 0  . Years of education: Not on file  . Highest education level: Some college, no degree  Occupational History  . Occupation: disability for hearing loss  Social Needs  . Financial resource strain: Somewhat hard  . Food insecurity    Worry: Never true    Inability: Never true  . Transportation needs    Medical: No    Non-medical: No  Tobacco Use  . Smoking status: Former Smoker    Packs/day: 1.00    Years: 2.00    Pack years: 2.00    Types: Cigarettes    Start date: 11/28/2004    Quit date: 11/28/2006    Years since quitting: 12.6  . Smokeless tobacco: Never Used  Substance and Sexual Activity  . Alcohol use: Not Currently    Alcohol/week: 0.0 standard drinks    Frequency: Never    Comment: occass  . Drug use: No  . Sexual activity: Not Currently    Birth control/protection: Pill    Comment: Seasonale  Lifestyle  . Physical activity    Days per week: 7 days    Minutes per session: 150+ min  . Stress: To some extent  Relationships  . Social Herbalist on phone: Once a week    Gets together: Never    Attends religious service: Never    Active member of club or organization: No    Attends meetings of clubs or organizations: Never    Relationship status: Never married  Other Topics Concern  . Not on file  Social History Narrative   Lives with her adopted  parents   Hearing loss since birth     Allergies:  Allergies  Allergen Reactions  . Budesonide-Formoterol Fumarate Anaphylaxis and Other (See Comments)    Other reaction(s): Other (See Comments) chest pain Chest pain chest pain  . Astelin [Azelastine]     Tingling, numbness, nausea  . Gabapentin Nausea And Vomiting  . Nortriptyline Other (See Comments)    Other reaction(s): Other (See Comments)  . Diclofenac Other (See Comments)    Other reaction(s): Other (See Comments)  . Duloxetine Hcl Other (See Comments)    Other reaction(s): Other (See Comments)  .  Montelukast Sodium Other (See Comments)  . Mupirocin Other (See Comments)  . Naproxen Other (See Comments) and Rash  . Naproxen Sodium Rash    mild rash  . Oxycodone Hcl Rash  . Sulfa Antibiotics Hives and Rash    Metabolic Disorder Labs: Lab Results  Component Value Date   HGBA1C 4.9 01/29/2018   MPG 94 01/29/2018   MPG 97 10/10/2016   No results found for: PROLACTIN Lab Results  Component Value Date   CHOL 131 09/26/2018   TRIG 87 09/26/2018   HDL 45 (L) 09/26/2018   CHOLHDL 2.9 09/26/2018   VLDL 20 10/10/2016   LDLCALC 69 09/26/2018   LDLCALC 195 (H) 06/22/2018   Lab Results  Component Value Date   TSH 2.84 01/29/2018   TSH 1.649 03/02/2016  Therapeutic Level Labs: No results found for: LITHIUM No results found for: VALPROATE No components found for:  CBMZ  Current Medications: Current Outpatient Medications  Medication Sig Dispense Refill  . BOTOX 100 units SOLR injection every 3 (three) months.     . calcium carbonate (TUMS - DOSED IN MG ELEMENTAL CALCIUM) 500 MG chewable tablet Chew 1 tablet by mouth daily as needed.     . diltiazem (CARDIZEM CD) 120 MG 24 hr capsule Take 1 capsule (120 mg total) by mouth daily. 90 capsule 2  . diphenhydrAMINE (BENADRYL) 50 MG tablet Take 50 mg by mouth at bedtime as needed for itching.    . diphenhydrAMINE-APAP, sleep, (MIDOL PM PO) Take by mouth as needed.    Marland Kitchen. FOLIC ACID PO Take by mouth.    . metoprolol succinate (TOPROL-XL) 50 MG 24 hr tablet Take 1 tablet (50 mg total) by mouth daily. 90 tablet 2  . norethindrone (AYGESTIN) 5 MG tablet TAKE 1 TABLET BY MOUTH EVERY DAY 90 tablet 1  . rosuvastatin (CRESTOR) 20 MG tablet TAKE 1 TABLET BY MOUTH EVERY DAY 90 tablet 0  . venlafaxine (EFFEXOR) 75 MG tablet Take 1 tablet (75 mg total) by mouth 2 (two) times daily with a meal. 60 tablet 0   No current facility-administered medications for this visit.      Musculoskeletal: Strength & Muscle Tone: UTA Gait & Station:  Reports as WNL Patient leans: N/A  Psychiatric Specialty Exam: Review of Systems  Constitutional: Positive for malaise/fatigue.  Gastrointestinal: Positive for constipation.  Psychiatric/Behavioral: Positive for depression. The patient is nervous/anxious.   All other systems reviewed and are negative.   There were no vitals taken for this visit.There is no height or weight on file to calculate BMI.  General Appearance: Casual  Eye Contact:  Fair  Speech:  Clear and Coherent  Volume:  Normal  Mood:  Anxious and Depressed  Affect:  Congruent  Thought Process:  Goal Directed and Descriptions of Associations: Intact  Orientation:  Full (Time, Place, and Person)  Thought Content: Logical   Suicidal Thoughts:  No  Homicidal Thoughts:  No  Memory:  Immediate;   Fair Recent;   Fair Remote;   Fair  Judgement:  Fair  Insight:  Fair  Psychomotor Activity:  UTA  Concentration:  Concentration: Fair and Attention Span: Fair  Recall:  FiservFair  Fund of Knowledge: Fair  Language: Fair  Akathisia:  No  Handed:  Right  AIMS (if indicated): denies tremors, rigidity  Assets:  Communication Skills Desire for Improvement Housing Social Support  ADL's:  Intact  Cognition: WNL  Sleep:  Fair   Screenings: GAD-7     [facility]  Total GAD-7 Score  14  7  12  7     PHQ2-9     [facility]  PHQ-2 Total Score  3  2  2  2  1   PHQ-9 Total Score  17  12  8  9   -        Assessment and Plan: Yesenia Wood is  a 33 year old female who is single, unemployed, lives in [place], has a history of depression, anxiety, multiple medical problems, is deaf and mute, was evaluated by phone today.  Patient continues to struggle with depression as well as side effects to her medication.  She is biologically predisposed given her history of trauma.  She also has psychosocial stressors of her physical limitations, health problems.  She will continue to benefit from medication readjustment.  Plan MDD- unstable Change venlafaxine to 75 mg p.o. twice daily. Continue l-methylfolate 7.5 mg p.o. daily for folic conversion reduction  Social anxiety disorder- some progress Continue CBT with Ms. Tasia Catchingsraig  For insomnia- improving Will monitor closely.  Discussed with patient that she is on multiple medications which can cause her to have constipation.  She may also have a discussion with her PMD.  She will monitor her drowsiness during the day closely with these medication changes.  Did advise her to call us back with concerns.  GeneSight testing results were reviewed while making medication changes today.  Follow-up in clinic in 3 to 4 weeks or sooner if needed.  September 17 at 3 PM  I have spent atleast 15 minutes non face to face with patient today. More than 50 % of the time was spent for psychoeducation and supportive psychotherapy and care coordination.  This note was generated in part or whole with voice recognition software. Voice recognition is usually quite accurate but there are transcription errors that can and very often do occur. I apologize for any typographical errors that were not detected and corrected.        Jomarie LongsSaramma Mohanad Carsten, MD 07/17/2019, 5:28 PM

## 2019-07-23 ENCOUNTER — Other Ambulatory Visit: Payer: Self-pay

## 2019-07-23 ENCOUNTER — Ambulatory Visit (INDEPENDENT_AMBULATORY_CARE_PROVIDER_SITE_OTHER): Payer: Medicare Other | Admitting: Licensed Clinical Social Worker

## 2019-07-23 ENCOUNTER — Encounter: Payer: Self-pay | Admitting: Licensed Clinical Social Worker

## 2019-07-23 DIAGNOSIS — F331 Major depressive disorder, recurrent, moderate: Secondary | ICD-10-CM

## 2019-07-23 NOTE — Progress Notes (Signed)
Virtual Visit via Video Note  I connected with Yesenia Wood on 07/23/19 at 11:00 AM EDT by a video enabled telemedicine application and verified that I am speaking with the correct person using two identifiers.   I discussed the limitations of evaluation and management by telemedicine and the availability of in person appointments. The patient expressed understanding and agreed to proceed.    I discussed the assessment and treatment plan with the patient. The patient was provided an opportunity to ask questions and all were answered. The patient agreed with the plan and demonstrated an understanding of the instructions.   The patient was advised to call back or seek an in-person evaluation if the symptoms worsen or if the condition fails to improve as anticipated.  I provided 45 minutes of non-face-to-face time during this encounter.   Alden Hipp, LCSW    THERAPIST PROGRESS NOTE  Session Time: 1100  Participation Level: Active  Behavioral Response: CasualAlertAnxious  Type of Therapy: Individual Therapy  Treatment Goals addressed: Anxiety  Interventions: CBT  Summary: Yesenia Wood is a 33 y.o. female who presents with continued symptoms related to her diagnosis. Yesenia Wood reports doing well since our last session. She reports increased motivation around participating in her artwork. "I've been drawing a lot. Some of my drawings are weird, but I'm doing it." LCSW encouraged Yesenia Wood to continue participating in that activity as it is something she previously did not have motivation to complete. Yesenia Wood reports she feels her medication is helping significantly, but at times she still feels "blah." LCSW encouraged Yesenia Wood to look at her progress versus the negatives associated with the medication. Yesenia Wood was able to do this and stated at times she feels she focuses too much on the negatives. LCSW validated those feelings but reassured Yesenia Wood that it is extremely common. Yesenia Wood reported  her skin picking/hair pulling has gotten worse over the last few weeks. LCSW encouraged Yesenia Wood to track when she does it the most frequently so we are able to keep her hands busy with something else during that time. Further, LCSW encouraged Yesenia Wood to look into a bracelet called Habit Aware that assist in correcting Body Focused Repetitive Behaviors. Yesenia Wood expressed understanding and agreement with this idea and stated she felt it might be very helpful. Additionally, Yesenia Wood reported continuing to take part in her OCD rituals. LCSW encouraged Yesenia Wood to pick up the ritual prevention techniques we've discussed previously. Yesenia Wood expressed understanding and agreement with this idea as well.  Suicidal/Homicidal: No  Therapist Response: Yesenia Wood continues to work towards her tx goals but has not yet reached them. We will continue to work on emotional regulation skills moving forward.   Plan: Return again in 4 weeks.  Diagnosis: Axis I: MDD    Axis II: No diagnosis    Alden Hipp, LCSW 07/23/2019

## 2019-07-25 DIAGNOSIS — G518 Other disorders of facial nerve: Secondary | ICD-10-CM | POA: Diagnosis not present

## 2019-07-25 DIAGNOSIS — M542 Cervicalgia: Secondary | ICD-10-CM | POA: Diagnosis not present

## 2019-07-25 DIAGNOSIS — G43109 Migraine with aura, not intractable, without status migrainosus: Secondary | ICD-10-CM | POA: Diagnosis not present

## 2019-07-25 DIAGNOSIS — G43719 Chronic migraine without aura, intractable, without status migrainosus: Secondary | ICD-10-CM | POA: Diagnosis not present

## 2019-07-25 DIAGNOSIS — M791 Myalgia, unspecified site: Secondary | ICD-10-CM | POA: Diagnosis not present

## 2019-07-25 DIAGNOSIS — G43111 Migraine with aura, intractable, with status migrainosus: Secondary | ICD-10-CM | POA: Diagnosis not present

## 2019-08-09 ENCOUNTER — Other Ambulatory Visit: Payer: Self-pay

## 2019-08-09 ENCOUNTER — Ambulatory Visit (INDEPENDENT_AMBULATORY_CARE_PROVIDER_SITE_OTHER): Payer: Medicare Other

## 2019-08-09 DIAGNOSIS — Z23 Encounter for immunization: Secondary | ICD-10-CM | POA: Diagnosis not present

## 2019-08-14 ENCOUNTER — Ambulatory Visit (INDEPENDENT_AMBULATORY_CARE_PROVIDER_SITE_OTHER): Payer: Medicare Other | Admitting: Licensed Clinical Social Worker

## 2019-08-14 ENCOUNTER — Other Ambulatory Visit: Payer: Self-pay

## 2019-08-14 ENCOUNTER — Encounter: Payer: Self-pay | Admitting: Licensed Clinical Social Worker

## 2019-08-14 DIAGNOSIS — F331 Major depressive disorder, recurrent, moderate: Secondary | ICD-10-CM

## 2019-08-14 NOTE — Progress Notes (Signed)
Virtual Visit via Telephone Note  I connected with Yesenia Wood on 08/14/19 at 11:00 AM EDT by telephone and verified that I am speaking with the correct person using two identifiers.   I discussed the limitations, risks, security and privacy concerns of performing an evaluation and management service by telephone and the availability of in person appointments. I also discussed with the patient that there may be a patient responsible charge related to this service. The patient expressed understanding and agreed to proceed.    I discussed the assessment and treatment plan with the patient. The patient was provided an opportunity to ask questions and all were answered. The patient agreed with the plan and demonstrated an understanding of the instructions.   The patient was advised to call back or seek an in-person evaluation if the symptoms worsen or if the condition fails to improve as anticipated.  I provided 30 minutes of non-face-to-face time during this encounter.   Yesenia Hipp, Yesenia Wood    THERAPIST PROGRESS NOTE  Session Time: 1100  Participation Level: Active  Behavioral Response: NeatAlertDepressed  Type of Therapy: Individual Therapy  Treatment Goals addressed: Coping  Interventions: Supportive  Summary: Yesenia Wood is a 33 y.o. female who presents with continued symptoms related to her diagnosis. Yesenia Wood's connection was disrupted several times throughout our call, so it was difficult to complete the session at times. We utilized an interpreter line in order to help facilitate effective communication between Yesenia Wood and Yesenia Wood. Yesenia Wood reported things have been going well, and her symptoms have remained the same. She noted they have not gotten worse, which was a positive observation from Yesenia Wood. Yesenia Wood validated these feelings, and encouraged Yesenia Wood to continue utilizing the coping skills discussed during sessions in order to improve symptoms. Yesenia Wood expressed understanding and  agreement. She reported a desire to start baking more, and using things from her garden to bake. Yesenia Wood encouraged Yesenia Wood to commit to this idea, as it would be a wonderful coping mechanism to have over time. Yesenia Wood expressed agreement. She reported feeling nervous her Yesenia Wood and Yesenia Wood were coming into town soon, and she felt she would become stressed upon their arrival. Yesenia Wood validated these feelings and encouraged [name] to have ideas ready to help decrease her anxiety in the moment, since she is already anticipating being stressed. [name] expressed agreement.   Suicidal/Homicidal: No  Therapist Response: Yesenia Wood continues to work towards her tx goals but has not yet reached them. We will continue to work on emotional regulation skills moving forward, and utilizing CBT skills to decrease anxiety/depression symptoms in the moment.   Plan: Return again in 4 weeks.  Diagnosis: Axis I: MDD    Axis II: No diagnosis    Yesenia Hipp, Yesenia Wood 08/14/2019

## 2019-08-15 ENCOUNTER — Ambulatory Visit (INDEPENDENT_AMBULATORY_CARE_PROVIDER_SITE_OTHER): Payer: Medicare Other | Admitting: Psychiatry

## 2019-08-15 ENCOUNTER — Encounter: Payer: Self-pay | Admitting: Psychiatry

## 2019-08-15 DIAGNOSIS — F5105 Insomnia due to other mental disorder: Secondary | ICD-10-CM

## 2019-08-15 DIAGNOSIS — F4010 Social phobia, unspecified: Secondary | ICD-10-CM | POA: Diagnosis not present

## 2019-08-15 DIAGNOSIS — F331 Major depressive disorder, recurrent, moderate: Secondary | ICD-10-CM

## 2019-08-15 MED ORDER — VENLAFAXINE HCL 75 MG PO TABS: 75.0000 mg | ORAL_TABLET | Freq: Every day | ORAL | 0 refills | Status: DC

## 2019-08-15 NOTE — Progress Notes (Signed)
Virtual Visit via Telephone Note  I connected with Yesenia Wood on 08/15/19 at  3:00 PM EDT by telephone and verified that I am speaking with the correct person using two identifiers.   I discussed the limitations, risks, security and privacy concerns of performing an evaluation and management service by telephone and the availability of in person appointments. I also discussed with the patient that there may be a patient responsible charge related to this service. The patient expressed understanding and agreed to proceed.      I discussed the assessment and treatment plan with the patient. The patient was provided an opportunity to ask questions and all were answered. The patient agreed with the plan and demonstrated an understanding of the instructions.   The patient was advised to call back or seek an in-person evaluation if the symptoms worsen or if the condition fails to improve as anticipated.  [place] MD OP Progress Note  08/15/2019 4:59 PM [name]  MRN:  [id number]  Chief Complaint:  Chief Complaint    Follow-up     HPI: Yesenia Wood is a 33 year old Caucasian female, unemployed, single, lives in [place], has a history of depression, anxiety, insomnia, migraine headaches, vitamin B12 deficiency, bilateral hearing loss, tachycardia, is mute was evaluated by phone today.  Telephone interpreter was made use of further evaluation today.  Patient today reports she continues to struggle with fatigue, excessive sleepiness during the day as well as anxiety.  She reports she has been taking the venlafaxine and does not know if it is helpful.  She also reports she struggles with constipation.  She however reports she has been struggling with constipation all her life.  However since being on venlafaxine her constipation may have gotten worse also.  She reports she currently is able to move her bowel only 2-3 times a month.  She has made several changes in her lifestyle including changing her  diet, has been using over-the-counter laxatives, however nothing has been helpful.  Patient reports she also has been sleeping too much during the day as well as at night.  She reports she had a sleep study done maybe a year ago however that came back with only restless leg symptoms and she was told she does not have sleep apnea.  Patient appeared to be alert, oriented to person place and situation.  Patient denies any suicidality, homicidality or perceptual disturbances.  Patient reports she has been seeing her therapist and is motivated to continue therapy sessions. Visit Diagnosis:    ICD-10-CM   1. MDD (major depressive disorder), recurrent episode, moderate (HCC)  F33.1 venlafaxine (EFFEXOR) 75 MG tablet  2. Social anxiety disorder  F40.10 venlafaxine (EFFEXOR) 75 MG tablet  3. Insomnia due to mental condition  F51.05     Past Psychiatric History: I have reviewed past psychiatric history from my progress note on 09/10/2018.  Past trials of Zoloft, Cymbalta, Pamelor, gabapentin, Abilify, Belsomra, hydroxyzine, trazodone, Ambien, Remeron, Sonata, Wellbutrin  Past Medical History:  Past Medical History:  Diagnosis Date  . ADHD (attention deficit hyperactivity disorder)   . B12 deficiency   . Bell's palsy    right sided  . Chronic migraine   . Deaf   . Depression   . 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 Fatigue   . Fatty liver   . Fibromyalgia   . GERD (gastroesophageal reflux disease)   . Headache   . High triglycerides   .  Hyperglycemia   . Hypertension   . Hyperthyroidism   . Moderate asthma   . Obesity   . Paresthesia   . Premature birth     Past Surgical History:  Procedure Laterality Date  . BARTHOLIN GLAND CYST REMOVAL    . COCHLEAR IMPLANT    . COCHLEAR IMPLANT REMOVAL    . LAPAROSCOPIC TUBAL LIGATION Bilateral 03/26/2018   Procedure: LAPAROSCOPIC TUBAL LIGATION;  Surgeon: Hildred Laser, MD;  Location:  [facility] ORS;  Service: Gynecology;  Laterality: Bilateral;  with peritoneal biopsies  . LAPAROSCOPY    . MYRINGOTOMY WITH TUBE PLACEMENT    . TUBAL LIGATION  03/26/2018    Family Psychiatric History: I have reviewed family psychiatric history from my progress note on 09/10/2018  Family History:  Family History  Adopted: Yes  Family history unknown: Yes    Social History: I have reviewed social history from my progress note on 09/10/2018 Social History   Socioeconomic History  . Marital status: Single    Spouse name: Not on file  . Number of children: 0  . Years of education: Not on file  . Highest education level: Some college, no degree  Occupational History  . Occupation: disability for hearing loss  Social Needs  . Financial resource strain: Somewhat hard  . Food insecurity    Worry: Never true    Inability: Never true  . Transportation needs    Medical: No    Non-medical: No  Tobacco Use  . Smoking status: Former Smoker    Packs/day: 1.00    Years: 2.00    Pack years: 2.00    Types: Cigarettes    Start date: 11/28/2004    Quit date: 11/28/2006    Years since quitting: 12.7  . Smokeless tobacco: Never Used  Substance and Sexual Activity  . Alcohol use: Not Currently    Alcohol/week: 0.0 standard drinks    Frequency: Never    Comment: occass  . Drug use: No  . Sexual activity: Not Currently    Birth control/protection: Pill    Comment: Seasonale  Lifestyle  . Physical activity    Days per week: 7 days    Minutes per session: 150+ min  . Stress: To some extent  Relationships  . Social Musician on phone: Once a week    Gets together: Never    Attends religious service: Never    Active member of club or organization: No    Attends meetings of clubs or organizations: Never    Relationship status: Never married  Other Topics Concern  . Not on file  Social History Narrative   Lives with her adopted  parents   Hearing loss since birth      Allergies:  Allergies  Allergen Reactions  . Budesonide-Formoterol Fumarate Anaphylaxis and Other (See Comments)    Other reaction(s): Other (See Comments) chest pain Chest pain chest pain  . Astelin [Azelastine]     Tingling, numbness, nausea  . Gabapentin Nausea And Vomiting  . Nortriptyline Other (See Comments)    Other reaction(s): Other (See Comments)  . Diclofenac Other (See Comments)    Other reaction(s): Other (See Comments)  . Duloxetine Hcl Other (See Comments)    Other reaction(s): Other (See Comments)  . Montelukast Sodium Other (See Comments)  . Mupirocin Other (See Comments)  . Naproxen Other (See Comments) and Rash  . Naproxen Sodium Rash    mild rash  . Oxycodone Hcl Rash  . Sulfa Antibiotics  Hives and Rash    Metabolic Disorder Labs: Lab Results  Component Value Date   HGBA1C 4.9 01/29/2018   MPG 94 01/29/2018   MPG 97 10/10/2016   No results found for: PROLACTIN Lab Results  Component Value Date   CHOL 131 09/26/2018   TRIG 87 09/26/2018   HDL 45 (L) 09/26/2018   CHOLHDL 2.9 09/26/2018   VLDL 20 10/10/2016   LDLCALC 69 09/26/2018   LDLCALC 195 (H) 06/22/2018   Lab Results  Component Value Date   TSH 2.84 01/29/2018   TSH 1.649 03/02/2016    Therapeutic Level Labs: No results found for: LITHIUM No results found for: VALPROATE No components found for:  CBMZ  Current Medications: Current Outpatient Medications  Medication Sig Dispense Refill  . albuterol (VENTOLIN HFA) 108 (90 Base) MCG/ACT inhaler INHALE TWO PUFFS BY MOUTH EVERY FOUR HOURS    . beclomethasone (QVAR) 80 MCG/ACT inhaler Inhale into the lungs.    . drospirenone-ethinyl estradiol (YAZ) 3-0.02 MG tablet Take by mouth.    . oxyCODONE-acetaminophen (PERCOCET/ROXICET) 5-325 MG tablet Take by mouth.    Marland Kitchen. BOTOX 100 units SOLR injection every 3 (three) months.     . calcium carbonate (TUMS - DOSED IN MG ELEMENTAL CALCIUM) 500 MG chewable tablet Chew 1 tablet by mouth daily as  needed.     . cyclobenzaprine (FLEXERIL) 10 MG tablet Take by mouth.    . diltiazem (CARDIZEM CD) 120 MG 24 hr capsule Take 1 capsule (120 mg total) by mouth daily. 90 capsule 2  . diphenhydrAMINE (BENADRYL) 50 MG tablet Take 50 mg by mouth at bedtime as needed for itching.    . diphenhydrAMINE-APAP, sleep, (MIDOL PM PO) Take by mouth as needed.    Marland Kitchen. FOLIC ACID PO Take by mouth.    . metoprolol succinate (TOPROL-XL) 50 MG 24 hr tablet Take 1 tablet (50 mg total) by mouth daily. 90 tablet 2  . norethindrone (AYGESTIN) 5 MG tablet TAKE 1 TABLET BY MOUTH EVERY DAY 90 tablet 1  . rosuvastatin (CRESTOR) 20 MG tablet TAKE 1 TABLET BY MOUTH EVERY DAY 90 tablet 0  . venlafaxine (EFFEXOR) 75 MG tablet Take 1 tablet (75 mg total) by mouth daily. Being weaned off 60 tablet 0   No current facility-administered medications for this visit.      Musculoskeletal: Strength & Muscle Tone: UTA Gait & Station: reports as WNL Patient leans: UTA  Psychiatric Specialty Exam: Review of Systems  Constitutional: Positive for malaise/fatigue.  Gastrointestinal: Positive for constipation.  Psychiatric/Behavioral: The patient is nervous/anxious.   All other systems reviewed and are negative.   There were no vitals taken for this visit.There is no height or weight on file to calculate BMI.  General Appearance: UTA  Eye Contact:  UTA  Speech:  Patient is mute  Volume:  patient is mute  Mood:  Anxious  Affect:  UTA  Thought Process:  Goal Directed and Descriptions of Associations: Intact  Orientation:  Full (Time, Place, and Person)  Thought Content: Logical   Suicidal Thoughts:  No  Homicidal Thoughts:  No  Memory:  Immediate;   Fair Recent;   Fair Remote;   Fair  Judgement:  Fair  Insight:  Fair  Psychomotor Activity:  UTA  Concentration:  Concentration: Fair and Attention Span: Fair  Recall:  FiservFair  Fund of Knowledge: Fair  Language: Fair  Akathisia:  No  Handed:  Right  AIMS (if indicated):  denies tremors, rigidity  Assets:  Communication Skills  Desire for Improvement Social Support  ADL's:  Intact  Cognition: WNL  Sleep:  Excessive   Screenings: GAD-7     [facility]  Total GAD-7 Score  14  7  12  7     PHQ2-9     [facility]  PHQ-2 Total Score  3  2  2  2  1   PHQ-9 Total Score  17  12  8  9   -       Assessment and Plan: [name]  is a 33 year old Caucasian female who is single, unemployed, [place] in [place], has a history of depression, anxiety, multiple medical problems, is deaf and mute was evaluated by phone today.  Patient continues to struggle with possible side effects to medications as well as depression and anxiety and excessive sleep.  Discussed the following medication changes.  Plan as noted below.  Plan For MDD-unstable Patient is currently not tolerating venlafaxine and reports worsening constipation. Will taper off venlafaxine. Discussed with patient to start taking venlafaxine 75 mg p.o. daily for 2 weeks, 37.5 mg p.o. daily for 2 weeks, and 37.5 mg every other day for 1 week and then stop taking it. 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  Social anxiety disorder-some progress Continue CBT with Ms. Craig  Sleep problems-patient currently reports excessive sleep day and night. She had sleep study done a year ago.  After weaning her off of the venlafaxine will monitor her closely and if  she continues to have problems we will refer her for another sleep study. Also discussed starting a medication for restless leg symptoms.  She reports she wants to wait until the next appointment.  Patient advised to continue psychotherapy sessions for now.  GeneS[employer] testing results were reviewed by making medication changes.  Follow-up in clinic in 6 weeks or sooner if needed.  November 2 at 2:30 PM  I have spent atleast 15 minutes non face to face with patient today. More than 50 % of the time was spent for psychoeducation and supportive psychotherapy and care coordination. This note was generated in part or whole with voice recognition software. Voice recognition is usually quite accurate but there are transcription errors that can and very often do occur. I apologize for any typographical errors that were not detected and corrected.         Jomarie LongsSaramma Orvilla Truett, MD 08/15/2019, 4:59 PM

## 2019-08-18 ENCOUNTER — Encounter: Payer: Self-pay | Admitting: Family Medicine

## 2019-08-27 ENCOUNTER — Other Ambulatory Visit: Payer: Self-pay | Admitting: Psychiatry

## 2019-08-27 DIAGNOSIS — F4010 Social phobia, unspecified: Secondary | ICD-10-CM

## 2019-08-27 DIAGNOSIS — F331 Major depressive disorder, recurrent, moderate: Secondary | ICD-10-CM

## 2019-09-13 ENCOUNTER — Ambulatory Visit (INDEPENDENT_AMBULATORY_CARE_PROVIDER_SITE_OTHER): Payer: Medicare Other | Admitting: Licensed Clinical Social Worker

## 2019-09-13 ENCOUNTER — Other Ambulatory Visit: Payer: Self-pay

## 2019-09-13 ENCOUNTER — Encounter: Payer: Self-pay | Admitting: Licensed Clinical Social Worker

## 2019-09-13 DIAGNOSIS — F4010 Social phobia, unspecified: Secondary | ICD-10-CM

## 2019-09-13 DIAGNOSIS — F331 Major depressive disorder, recurrent, moderate: Secondary | ICD-10-CM | POA: Diagnosis not present

## 2019-09-13 NOTE — Progress Notes (Signed)
Virtual Visit via Telephone Note  I connected with Yesenia Wood on 09/13/19 at 11:00 AM EDT by telephone and verified that I am speaking with the correct person using two identifiers.   I discussed the limitations, risks, security and privacy concerns of performing an evaluation and management service by telephone and the availability of in person appointments. I also discussed with the patient that there may be a patient responsible charge related to this service. The patient expressed understanding and agreed to proceed.  I discussed the assessment and treatment plan with the patient. The patient was provided an opportunity to ask questions and all were answered. The patient agreed with the plan and demonstrated an understanding of the instructions.   The patient was advised to call back or seek an in-person evaluation if the symptoms worsen or if the condition fails to improve as anticipated.  I provided 45 minutes of non-face-to-face time during this encounter.   Alden Hipp, LCSW    THERAPIST PROGRESS NOTE  Session Time: 1100  Participation Level: Active  Behavioral Response: NeatAlertAnxious  Type of Therapy: Individual Therapy  Treatment Goals addressed: Coping  Interventions: CBT  Summary: Yesenia Wood is a 33 y.o. female who presents with continued symptoms related to her diagnosis. Yesenia Wood reports doing well since our last session. She reports she was able to spend time with her sister and niece, which was enjoyable but she noted she often felt left out. She reported her niece talks "a mile a minute," and she is not able to keep up as she can't hear what she is saying. She reported often her family members will forget to sign, and that can be difficult for her and ultimately makes her feel excluded from family activities. LCSW validated Yesenia Wood's feelings and encouraged her to talk with her family about her feelings and brain storm solutions together. Further, LCSW  encouraged Yesenia Wood to utilize CBT skills to challenge the negative thoughts that tell her she is not a part of the family. We reviewed how CBT skills could be utilized for these purposes. Yesenia Wood moved on to discussing how she feels stuck and is not able to work towards her goals due to migraines. LCSW validated her feelings and encouraged her to set small goals. She stated her migraines keep her from doing much at all, and she is scared to take a new medication due to the possibility of increasing depression. LCSW encouraged Yesenia Wood to speak with her MD about this issue, but noted we can always assist in managing depression but her migraines appear to be debilitating, which is something that needs to be addressed. However, LCSW emphasized the importance of bringing these things up with her medical provider. Yesenia Wood expressed understanding and agreement. We walked through several examples of how Yesenia Wood could utilize CBT skills to challenge the idea of her being stuck."   Suicidal/Homicidal: No  Therapist Response: Yesenia Wood continues to work towards her tx goals but has not yet reached them. We will continue to work on emotional regulation skills and improving CBT skills moving forward.   Plan: Return again in 4 weeks.  Diagnosis: Axis I: MDD    Axis II: No diagnosis    Alden Hipp, LCSW 09/13/2019

## 2019-09-18 ENCOUNTER — Other Ambulatory Visit: Payer: Self-pay | Admitting: Family Medicine

## 2019-09-18 NOTE — Progress Notes (Signed)
Cardiology Office Note    Date:  09/23/2019   ID:  MAILI SHUTTERS, DOB 02-25-1986, MRN [id number]  PCP:  Steele Sizer, MD  Cardiologist:  Kathlyn Sacramento, MD  Electrophysiologist:  None   Chief Complaint: Follow-up  History of Present Illness:   Yesenia Wood is a 33 y.o. 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  Prior echo in 09/2014 was normal.  She underwent treadmill stress testing in 05/2015 that was normal.  However, her heart rate and BP were noted to be elevated at peak exercise while she was started on extended release Cardizem.  She wore a 48-hour Holter monitor 12/2017 given palpitations which showed sinus tachycardia.  Despite treatment with diltiazem she continued to have tachycardic episodes.  Given her asthma, treatment with beta-blocker was initially deferred, but ultimately she was started on small dose Toprol 25 mg daily.  Following that, she reported significant improvement in symptoms, though still had some residual palpitations, when she was seen virtually on 04/25/2019.  In this setting, Toprol was increased to 50 mg daily.  She was advised to continue long-acting diltiazem 120 mg daily.  She comes in today noting some improvement in palpitations with titration of Toprol.  However, the above symptoms do persist.  She also notes a progressive worsening of exertional chest pain and shortness of breath that has actually been quite limiting for her.  She asks for a permanent handicap placard today given her underlying deafness and exertional chest discomfort.  With the palpitations, she does note some intermittent dizziness though no syncopal episodes.  No lower extremity swelling,  abdominal distention, orthopnea, PND, early satiety.  No falls.  She has not noted any worsening of her asthma with escalation of Toprol.  Labs: 08/2018 - BUN 12, SCr 0.81, potassium 4.1, albumin 4.8, AST 32, ALT 36, TC 131, TG 87, HDL 45, LDL 69    Past Medical History:  Diagnosis Date  . ADHD (attention deficit hyperactivity disorder)   . B12 deficiency   . Bell's palsy    right sided  . Chronic migraine   . Deaf   . Depression   . 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 Fatigue   . Fatty liver   . Fibromyalgia   . GERD (gastroesophageal reflux disease)   . Headache   . High triglycerides   . Hyperglycemia   . Hypertension   . Hyperthyroidism   . Moderate asthma   . Obesity   . Paresthesia   . Premature birth     Past Surgical History:  Procedure Laterality Date  . BARTHOLIN GLAND CYST REMOVAL    . COCHLEAR IMPLANT    . COCHLEAR IMPLANT REMOVAL    . LAPAROSCOPIC TUBAL LIGATION Bilateral 03/26/2018   Procedure: LAPAROSCOPIC TUBAL LIGATION;  Surgeon: Rubie Maid, MD;  Location: [facility] ORS;  Service: Gynecology;  Laterality: Bilateral;  with peritoneal biopsies  . LAPAROSCOPY    . MYRINGOTOMY WITH TUBE PLACEMENT    . TUBAL LIGATION  03/26/2018    Current Medications: Current Meds  Medication Sig  . BOTOX 100 units SOLR injection every 3 (three) months.   . diltiazem (CARDIZEM CD) 120 MG 24 hr capsule Take 1 capsule (120 mg total) by mouth daily.  . diphenhydrAMINE (  BENADRYL) 50 MG tablet Take 50 mg by mouth at bedtime as needed for itching.  . drospirenone-ethinyl estradiol (YAZ) 3-0.02 MG tablet Take by mouth.  . FOLIC ACID PO Take by mouth.  . metoprolol succinate (TOPROL-XL) 50 MG 24 hr tablet Take 1 tablet (50 mg total) by mouth daily.  . norethindrone (AYGESTIN) 5 MG tablet TAKE 1 TABLET BY MOUTH EVERY DAY  . rosuvastatin (CRESTOR) 20 MG tablet TAKE 1 TABLET BY MOUTH EVERY DAY  . venlafaxine (EFFEXOR) 75 MG  tablet Take 1 tablet (75 mg total) by mouth daily. Being weaned off    Allergies:   Budesonide-formoterol fumarate, Astelin [azelastine], Gabapentin, Nortriptyline, Milnacipran, Diclofenac, Duloxetine hcl, Montelukast sodium, Mupirocin, Naproxen, Naproxen sodium, Other, Oxycodone hcl, and Sulfa antibiotics   Social History   Socioeconomic History  . Marital status: Single    Spouse name: Not on file  . Number of children: 0  . Years of education: Not on file  . Highest education level: Some college, no degree  Occupational History  . Occupation: disability for hearing loss  Social Needs  . Financial resource strain: Somewhat hard  . Food insecurity    Worry: Never true    Inability: Never true  . Transportation needs    Medical: No    Non-medical: No  Tobacco Use  . Smoking status: Former Smoker    Packs/day: 1.00    Years: 2.00    Pack years: 2.00    Types: Cigarettes    Start date: 11/28/2004    Quit date: 11/28/2006    Years since quitting: 12.8  . Smokeless tobacco: Never Used  Substance and Sexual Activity  . Alcohol use: Not Currently    Alcohol/week: 0.0 standard drinks    Frequency: Never    Comment: occass  . Drug use: No  . Sexual activity: Not Currently    Birth control/protection: Pill    Comment: Seasonale  Lifestyle  . Physical activity    Days per week: 7 days    Minutes per session: 150+ min  . Stress: To some extent  Relationships  . Social Musicianconnections    Talks on phone: Once a week    Gets together: Never    Attends religious service: Never    Active member of club or organization: No    Attends meetings of clubs or organizations: Never    Relationship status: Never married  Other Topics Concern  . Not on file  Social History Narrative   Lives with her adopted  parents   Hearing loss since birth      Family History:  The patient's She was adopted. Family history is unknown by patient.  ROS:   Review of Systems  Constitutional: Positive  for malaise/fatigue. Negative for chills, diaphoresis, fever and weight loss.  HENT: Negative for congestion.   Eyes: Negative for discharge and redness.  Respiratory: Positive for shortness of breath. Negative for cough, hemoptysis, sputum production and wheezing.   Cardiovascular: Positive for chest pain and palpitations. Negative for orthopnea, claudication, leg swelling and PND.  Gastrointestinal: Negative for abdominal pain, blood in stool, heartburn, melena, nausea and vomiting.  Genitourinary: Negative for hematuria.  Musculoskeletal: Negative for falls and myalgias.  Skin: Negative for rash.  Neurological: Positive for weakness. Negative for dizziness, tingling, tremors, sensory change, speech change, focal weakness and loss of consciousness.  Endo/Heme/Allergies: Does not bruise/bleed easily.  Psychiatric/Behavioral: Negative for substance abuse. The patient is not nervous/anxious.   All other systems reviewed and are negative.  EKGs/Labs/Other Studies Reviewed:    Studies reviewed were summarized above. The additional studies were reviewed today: As above.   EKG:  EKG is ordered today.  The EKG ordered today demonstrates NSR, 70 bpm, normal axis, no acute ST-T changes  Recent Labs: 09/26/2018: ALT 36; BUN 12; Creat 0.81; Potassium 4.1; Sodium 139  Recent Lipid Panel    Component Value Date/Time   CHOL 131 09/26/2018 1158   CHOL 192 10/29/2015 1050   TRIG 87 09/26/2018 1158   HDL 45 (L) 09/26/2018 1158   HDL 43 10/29/2015 1050   CHOLHDL 2.9 09/26/2018 1158   VLDL 20 10/10/2016 1235   LDLCALC 69 09/26/2018 1158    PHYSICAL EXAM:    VS:  BP 100/62 (BP Location: Left Arm, Patient Position: Sitting, Cuff Size: Normal)   Pulse 70   Ht 5\' 3"  (1.6 m)   Wt 175 lb 8 oz (79.6 kg)   BMI 31.09 kg/m   BMI: Body mass index is 31.09 kg/m.  Physical Exam  Constitutional: She is oriented to person, place, and time. She appears well-developed and well-nourished.  Deaf, with  communication being obtained by sign language interpreter  HENT:  Head: Normocephalic and atraumatic.  Eyes: Right eye exhibits no discharge. Left eye exhibits no discharge.  Neck: Normal range of motion. No JVD present.  Cardiovascular: Normal rate, regular rhythm, S1 normal, S2 normal and normal heart sounds. Exam reveals no distant heart sounds, no friction rub, no midsystolic click and no opening snap.  No murmur heard. Pulmonary/Chest: Effort normal and breath sounds normal. No respiratory distress. She has no decreased breath sounds. She has no wheezes. She has no rales. She exhibits no tenderness.  Abdominal: Soft. She exhibits no distension. There is no abdominal tenderness.  Musculoskeletal:        General: No edema.  Neurological: She is alert and oriented to person, place, and time.  Skin: Skin is warm and dry. No cyanosis. Nails show no clubbing.  Psychiatric: She has a normal mood and affect. Her speech is normal and behavior is normal. Judgment and thought content normal.    Wt Readings from Last 3 Encounters:  09/23/19 175 lb 8 oz (79.6 kg)  04/25/19 160 lb (72.6 kg)  02/07/19 166 lb 11.2 oz (75.6 kg)     ASSESSMENT & PLAN:   1. Inappropriate sinus tachycardia/palpitations: Slightly improved with escalation of Toprol.  In this setting, we have agreed to titrate Toprol to 75 mg daily.  In an effort to prevent significant hypotension we will discontinue diltiazem with this.  If symptoms persist despite this we may need to have her seen by EP moving forward.   2. Exertional angina/DOE: Currently symptom-free.  Over the past several weeks she has noted progressive exertional dyspnea and angina that does not always seem to be associated with her tachypalpitations.  Schedule coronary CTA.  3. HTN: Blood pressure is well controlled today.  Continue current medications as outlined above.  4. HLD: Most recent LDL of 69.  Remains on Crestor.  5. Language barrier: Office visit  today was obtained with the aid of hospital supplied sending which interpreter.  Disposition: F/u with Dr. 04/09/19 or an APP in 4 to 6 weeks.   Medication Adjustments/Labs and Tests Ordered: Current medicines are reviewed at length with the patient today.  Concerns regarding medicines are outlined above. Medication changes, Labs and Tests ordered today are summarized above and listed in the Patient Instructions accessible in Encounters.   Signed, Kirke Corin  Aesha Agrawal, PA-C 09/23/2019 11:42 AM     [facility] [address] [place], [place] [address] [phone]

## 2019-09-20 DIAGNOSIS — G43719 Chronic migraine without aura, intractable, without status migrainosus: Secondary | ICD-10-CM | POA: Diagnosis not present

## 2019-09-20 DIAGNOSIS — G43109 Migraine with aura, not intractable, without status migrainosus: Secondary | ICD-10-CM | POA: Diagnosis not present

## 2019-09-20 DIAGNOSIS — G518 Other disorders of facial nerve: Secondary | ICD-10-CM | POA: Diagnosis not present

## 2019-09-20 DIAGNOSIS — M542 Cervicalgia: Secondary | ICD-10-CM | POA: Diagnosis not present

## 2019-09-20 DIAGNOSIS — G43111 Migraine with aura, intractable, with status migrainosus: Secondary | ICD-10-CM | POA: Diagnosis not present

## 2019-09-20 DIAGNOSIS — M791 Myalgia, unspecified site: Secondary | ICD-10-CM | POA: Diagnosis not present

## 2019-09-23 ENCOUNTER — Encounter: Payer: Self-pay | Admitting: Physician Assistant

## 2019-09-23 ENCOUNTER — Telehealth (HOSPITAL_COMMUNITY): Payer: Self-pay | Admitting: Emergency Medicine

## 2019-09-23 ENCOUNTER — Ambulatory Visit (INDEPENDENT_AMBULATORY_CARE_PROVIDER_SITE_OTHER): Payer: Medicare Other | Admitting: Physician Assistant

## 2019-09-23 ENCOUNTER — Other Ambulatory Visit: Payer: Self-pay

## 2019-09-23 VITALS — BP 100/62 | HR 70 | Ht 63.0 in | Wt 175.5 lb

## 2019-09-23 DIAGNOSIS — E782 Mixed hyperlipidemia: Secondary | ICD-10-CM

## 2019-09-23 DIAGNOSIS — I10 Essential (primary) hypertension: Secondary | ICD-10-CM

## 2019-09-23 DIAGNOSIS — R0609 Other forms of dyspnea: Secondary | ICD-10-CM

## 2019-09-23 DIAGNOSIS — I208 Other forms of angina pectoris: Secondary | ICD-10-CM | POA: Diagnosis not present

## 2019-09-23 DIAGNOSIS — R002 Palpitations: Secondary | ICD-10-CM

## 2019-09-23 DIAGNOSIS — Z789 Other specified health status: Secondary | ICD-10-CM

## 2019-09-23 DIAGNOSIS — R0600 Dyspnea, unspecified: Secondary | ICD-10-CM

## 2019-09-23 DIAGNOSIS — R000 Tachycardia, unspecified: Secondary | ICD-10-CM | POA: Diagnosis not present

## 2019-09-23 MED ORDER — METOPROLOL SUCCINATE ER 25 MG PO TB24: 75.0000 mg | ORAL_TABLET | Freq: Every day | ORAL | 3 refills | Status: DC

## 2019-09-23 NOTE — Patient Instructions (Signed)
Medication Instructions:  1- STOP Diltiazem  2- INCREASE Toprol Take 3 tablets (75 mg total) by mouth daily *If you need a refill on your cardiac medications before your next appointment, please call your pharmacy*  Lab Work: Your physician recommends that you return for lab work in: aprx 1 week at the medical mall.(CBC, BMET) No appt is needed. Hours are M-F 7AM- 6 PM.  If you have labs (blood work) drawn today and your tests are completely normal, you will receive your results only by: Marland Kitchen MyChart Message (if you have MyChart) OR . A paper copy in the mail If you have any lab test that is abnormal or we need to change your treatment, we will call you to review the results.  Testing/Procedures: 1- Your cardiac CT will be scheduled at one of the below locations:   [facility] [address] [place], [place] [address] [phone]  OR  [facility] [address] [place], [place] [address] [phone]  If scheduled at [facility], please arrive at the [facility] main entrance of [facility] 30-45 minutes prior to test start time. Proceed to the [facility] Radiology Department (first floor) to check-in and test prep.  If scheduled at [facility], please arrive 15 mins early for check-in and test prep.  Please follow these instructions carefully (unless otherwise directed):   On the Night Before the Test: . Be sure to Drink plenty of water. . Do not consume any caffeinated/decaffeinated beverages or chocolate 12 hours prior to your test. . Do not take any antihistamines 12 hours prior to your test.  On the Day of the Test: . Drink plenty of water. Do not drink any water within one hour of the test. . Do not eat any food 4 hours prior to the test. . You may take your regular medications prior to the test.  . Take metoprolol (Lopressor) two hours prior to test. . HOLD  Furosemide/Hydrochlorothiazide morning of the test. . FEMALES- please wear underwire-free bra if available       After the Test: . Drink plenty of water. . After receiving IV contrast, you may experience a mild flushed feeling. This is normal. . On occasion, you may experience a mild rash up to 24 hours after the test. This is not dangerous. If this occurs, you can take Benadryl 25 mg and increase your fluid intake. . If you experience trouble breathing, this can be serious. If it is severe call 911 IMMEDIATELY. If it is mild, please call our office. . If you take any of these medications: Glipizide/Metformin, Avandament, Glucavance, please do not take 48 hours after completing test unless otherwise instructed.   Once we have confirmed authorization from your insurance company, we will call you to set up a date and time for your test.   For non-scheduling related questions, please contact the cardiac imaging nurse navigator should you have any questions/concerns: Rockwell Alexandria, RN Navigator Cardiac Imaging [facility] [phone] Office     Follow-Up: At [facility], you and your health needs are our priority.  As part of our continuing mission to provide you with exceptional heart care, we have created designated Provider Care Teams.  These Care Teams include your primary Cardiologist (physician) and Advanced Practice Providers (APPs -  Physician Assistants and Nurse Practitioners) who all work together to provide you with the care you need, when you need it.  Your next appointment:   4-6 weeks  The format for your next appointment:   In Person  Provider:    You may see Kathlyn Sacramento, MD or Christell Faith, PA-C.

## 2019-09-23 NOTE — Telephone Encounter (Signed)
Reaching out to patient to offer assistance regarding upcoming cardiac imaging study; pt verbalizes understanding of appt date/time, parking situation and where to check in, pre-test NPO status and medications ordered, and verified current allergies; name and call back number provided for further questions should they arise Yesenia Bond RN [facility] [phone] office [phone] cell  ASL interpreter used for this conversation

## 2019-09-24 ENCOUNTER — Ambulatory Visit: Payer: Medicare Other | Admitting: Family Medicine

## 2019-09-24 ENCOUNTER — Other Ambulatory Visit
Admission: RE | Admit: 2019-09-24 | Discharge: 2019-09-24 | Disposition: A | Discharge status: Home / Self Care | Payer: Medicare Other | Source: Ambulatory Visit | Attending: Physician Assistant | Admitting: Physician Assistant

## 2019-09-24 DIAGNOSIS — R002 Palpitations: Secondary | ICD-10-CM | POA: Diagnosis not present

## 2019-09-24 LAB — BASIC METABOLIC PANEL
Anion gap: 11 (ref 5–15)
BUN: 16 mg/dL (ref 6–20)
CO2: 28 mmol/L (ref 22–32)
Calcium: 11.3 mg/dL — ABNORMAL HIGH (ref 8.9–10.3)
Chloride: 99 mmol/L (ref 98–111)
Creatinine, Ser: 0.78 mg/dL (ref 0.44–1.00)
GFR calc Af Amer: >60 mL/min (ref >60)
GFR calc non Af Amer: >60 mL/min (ref >60)
Glucose, Bld: 95 mg/dL (ref 70–99)
Potassium: 4.5 mmol/L (ref 3.5–5.1)
Sodium: 138 mmol/L (ref 135–145)

## 2019-09-24 LAB — CBC
HCT: 42.9 % (ref 36.0–46.0)
Hemoglobin: 14.3 g/dL (ref 12.0–15.0)
MCH: 28.8 pg (ref 26.0–34.0)
MCHC: 33.3 g/dL (ref 30.0–36.0)
MCV: 86.5 fL (ref 80.0–100.0)
Platelets: 296 10*3/uL (ref 150–400)
RBC: 4.96 MIL/uL (ref 3.87–5.11)
RDW: 13 % (ref 11.5–15.5)
WBC: 9.5 10*3/uL (ref 4.0–10.5)
nRBC: 0 % (ref 0.0–0.2)

## 2019-09-25 ENCOUNTER — Other Ambulatory Visit: Payer: Self-pay

## 2019-09-25 ENCOUNTER — Ambulatory Visit
Admission: RE | Admit: 2019-09-25 | Discharge: 2019-09-25 | Disposition: A | Discharge status: Home / Self Care | Payer: Medicare Other | Source: Ambulatory Visit | Attending: Physician Assistant | Admitting: Physician Assistant

## 2019-09-25 DIAGNOSIS — I208 Other forms of angina pectoris: Secondary | ICD-10-CM

## 2019-09-25 MED ORDER — DILTIAZEM HCL 25 MG/5ML IV SOLN
5.0000 mg | Freq: Once | INTRAVENOUS | Status: AC
  Administered 2019-09-25: 5 mg via INTRAVENOUS

## 2019-09-25 MED ORDER — NITROGLYCERIN 0.4 MG SL SUBL: 0.8000 mg | SUBLINGUAL_TABLET | Freq: Once | SUBLINGUAL | Status: DC

## 2019-09-25 MED ORDER — SODIUM CHLORIDE 0.9 % IV BOLUS: 500.0000 mL | Freq: Once | INTRAVENOUS | Status: DC

## 2019-09-25 MED ORDER — METOPROLOL TARTRATE 5 MG/5ML IV SOLN
5.0000 mg | INTRAVENOUS | Status: DC | PRN
  Administered 2019-09-25 (×3): 5 mg via INTRAVENOUS

## 2019-09-25 NOTE — Progress Notes (Signed)
Multiple attempts made to lower HR for cardiac CTA however unable to lower HR without dropping BP. Pt given total of 15mg  IV metoprolol and 10mg  IV cardizem; ending BP 100/66; 500cc NS bolus given.   Pt made decision to stop attempts to lower HR and reschedule and follow CT RN suggestion to ordering MD to provide PO Ivabradine to lower HR for future appt.   Pt given hot green tea; denies lightheadedness or dizziness; ambulatory to lobby with steady gait.   Entire encounter assisted with ASL interpreter.

## 2019-09-26 ENCOUNTER — Telehealth: Payer: Self-pay

## 2019-09-26 MED ORDER — IVABRADINE HCL 5 MG PO TABS: ORAL_TABLET | ORAL | 0 refills | Status: DC

## 2019-09-26 NOTE — Telephone Encounter (Signed)
See messaging from Christell Faith, PA for med order. Sent patient message to make her aware.

## 2019-09-26 NOTE — Telephone Encounter (Signed)
-----   Message from Rise Mu, PA-C sent at 09/25/2019  6:32 PM EDT ----- Regarding: FW: CTA will need rescheduled Nova Evett,   Please see the below message. Can we send on Corlanor 5 mg, 1 tab, to be taken 2 hours prior to her rescheduled coronary CTA on 10/02/2019?  Thanks!  Ryan ----- Message ----- From: Lorenza Evangelist, RN Sent: 09/25/2019   4:43 PM EDT To: Dorothy Spark, MD, Armando Gang, # Subject: RE: CTA will need rescheduled                  2 hr prior just like metoprolol will be fine. Ivin Booty has rescheduled her appt for 11/4 at 12:30  Thank you, Clarise Cruz ----- Message ----- From: Sindy Messing Sent: 09/25/2019   4:06 PM EDT To: Dorothy Spark, MD, Lorenza Evangelist, RN, # Subject: RE: CTA will need rescheduled                  Thanks for the update. I will get her set up with a PO dose of Corlanor prior to undergoing imaging. How early does she need to take it prior to getting scanned?  Thanks!  Ryan ----- Message ----- From: Lorenza Evangelist, RN Sent: 09/25/2019   1:54 PM EDT To: Dorothy Spark, MD, Armando Gang, # Subject: CTA will need rescheduled                      Ryan and Dr. Rockey Situ,  We attempted to scan Mrs Boniface today at [facility] however we were unable to lower the HR enough for test without lowering BP too much.  I think moving forward we should reschedule her with a PO dose of ivabradine for HR control. (okay for [facility] or [facility])  Today she received 15mg  IV metoprolol and 10mg  IV diltiazem. HR steady at 75 bpm. Ending BP was 100/66. She received a 500cc bolus and sent home with dad who was driver. Pt denies symptoms of dizziness or lightheadedness upon departure.  Pt made decision to stop todays efforts and attempt again with PO medications prior to next appt.   Thank you Marchia Bond RN Navigator Cardiac Imaging [facility] [phone] Office  [phone] Cell

## 2019-09-30 ENCOUNTER — Telehealth (HOSPITAL_COMMUNITY): Payer: Self-pay | Admitting: Emergency Medicine

## 2019-09-30 ENCOUNTER — Encounter: Payer: Self-pay | Admitting: Psychiatry

## 2019-09-30 ENCOUNTER — Ambulatory Visit: Payer: Self-pay

## 2019-09-30 ENCOUNTER — Other Ambulatory Visit: Payer: Self-pay

## 2019-09-30 ENCOUNTER — Ambulatory Visit (INDEPENDENT_AMBULATORY_CARE_PROVIDER_SITE_OTHER): Payer: Medicare Other | Admitting: Psychiatry

## 2019-09-30 DIAGNOSIS — G2581 Restless legs syndrome: Secondary | ICD-10-CM | POA: Diagnosis not present

## 2019-09-30 DIAGNOSIS — F4010 Social phobia, unspecified: Secondary | ICD-10-CM

## 2019-09-30 DIAGNOSIS — F5105 Insomnia due to other mental disorder: Secondary | ICD-10-CM

## 2019-09-30 DIAGNOSIS — F331 Major depressive disorder, recurrent, moderate: Secondary | ICD-10-CM

## 2019-09-30 DIAGNOSIS — I208 Other forms of angina pectoris: Secondary | ICD-10-CM

## 2019-09-30 MED ORDER — ROPINIROLE HCL 1 MG PO TABS: 1.0000 mg | ORAL_TABLET | Freq: Every day | ORAL | 1 refills | Status: DC

## 2019-09-30 NOTE — Telephone Encounter (Signed)
Pt. Reports she started having left lower abdominal pain 1 week ago. Has become constant. Pain 6/10. No problems with constipation or diarrhea. No fever. Warm transfer to [facility] in the practice for a visit. Answer Assessment - Initial Assessment Questions 1. LOCATION: "Where does it hurt?"      Left lower abdomen 2. RADIATION: "Does the pain shoot anywhere else?" (e.g., chest, back)     No 3. ONSET: "When did the pain begin?" (e.g., minutes, hours or days ago)      1 week ago 4. SUDDEN: "Gradual or sudden onset?"     Gradual 5. PATTERN "Does the pain come and go, or is it constant?"    - If constant: "Is it getting better, staying the same, or worsening?"      (Note: Constant means the pain never goes away completely; most serious pain is constant and it progresses)     - If intermittent: "How long does it last?" "Do you have pain now?"     (Note: Intermittent means the pain goes away completely between bouts)     Constant 6. SEVERITY: "How bad is the pain?"  (e.g., Scale 1-10; mild, moderate, or severe)   - MILD (1-3): doesn't interfere with normal activities, abdomen soft and not tender to touch    - MODERATE (4-7): interferes with normal activities or awakens from sleep, tender to touch    - SEVERE (8-10): excruciating pain, doubled over, unable to do any normal activities      6 7. RECURRENT SYMPTOM: "Have you ever had this type of abdominal pain before?" If so, ask: "When was the last time?" and "What happened that time?"      No 8. CAUSE: "What do you think is causing the abdominal pain?"     Unsure 9. RELIEVING/AGGRAVATING FACTORS: "What makes it better or worse?" (e.g., movement, antacids, bowel movement)     No 10. OTHER SYMPTOMS: "Has there been any vomiting, diarrhea, constipation, or urine problems?"       Hurts with bowel movements 11. PREGNANCY: "Is there any chance you are pregnant?" "When was your last menstrual period?"       No  Protocols used: ABDOMINAL PAIN -  [facility]

## 2019-09-30 NOTE — Telephone Encounter (Signed)
Left message on voicemail with name and callback number Yesenia Bond RN Navigator Cardiac Imaging [facility] [phone] Office [phone] Cell   Also texted another number which she requested and gave her brief instructions that way.  Yesenia Wood

## 2019-09-30 NOTE — Progress Notes (Signed)
Virtual Visit via Telephone Note  I connected with Yesenia Wood on 09/30/19 at  2:30 PM EST by telephone and verified that I am speaking with the correct person using two identifiers.   I discussed the limitations, risks, security and privacy concerns of performing an evaluation and management service by telephone and the availability of in person appointments. I also discussed with the patient that there may be a patient responsible charge related to this service. The patient expressed understanding and agreed to proceed.  I discussed the assessment and treatment plan with the patient. The patient was provided an opportunity to ask questions and all were answered. The patient agreed with the plan and demonstrated an understanding of the instructions.   The patient was advised to call back or seek an in-person evaluation if the symptoms worsen or if the condition fails to improve as anticipated.  BH MD OP Progress Note  09/30/2019 5:16 PM Yesenia Wood  MRN:  [id number]  Chief Complaint:  Chief Complaint    Follow-up     HPI: Yesenia ConesLaurie is a 33 year old Caucasian female, unemployed, single, lives in [place], has a history of depression, anxiety, insomnia, migraine headaches, vitamin B12 deficiency, bilateral hearing loss, tachycardia, a stimulant was evaluated by phone today.  Telephone interpreter was made use of during the evaluation.  Patient today reports she continues to struggle with fatigue, excessive sleepiness during the day.  She also struggles with mood symptoms like anxiety during the day.  She is completely off of the venlafaxine now.  She reports her constipation that she struggled with which she reported last visit has improved a lot.  Patient however reports she is currently struggling with cardiac problems and has appointment with cardiology scheduled.  She had a few sessions already and may need further work-up.  She reports she hence wants to wait until her cardiology visit  is over before she makes changes with any of the medications.  Patient denies any suicidality, homicidality or perceptual disturbances.  She reports sleep continues to be restless at night.  She however reports she had sleep study done in the past which showed restless leg syndrome.  She however reports she has not observed the restlessness herself however it may be true that she may have restless legs.  Discussed restarting her Requip which was given to him previously.  She was noncompliant.  She reports the 0.25 mg did not work.  Patient denies any other concerns today. Visit Diagnosis:    ICD-10-CM   1. MDD (major depressive disorder), recurrent episode, moderate (HCC)  F33.1   2. Social anxiety disorder  F40.10   3. Insomnia due to mental condition  F51.05 rOPINIRole (REQUIP) 1 MG tablet  4. RLS (restless legs syndrome)  G25.81 rOPINIRole (REQUIP) 1 MG tablet    Past Psychiatric History: I have reviewed past psychiatric history from my progress note on 09/10/2018.  Past trials of Zoloft, Cymbalta, Pamelor, gabapentin, Abilify, Belsomra, hydroxyzine, trazodone, Ambien, Remeron, Sonata, Wellbutrin  Past Medical History:  Past Medical History:  Diagnosis Date  . ADHD (attention deficit hyperactivity disorder)   . B12 deficiency   . Bell's palsy    right sided  . Chronic migraine   . Deaf   . Depression   . 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 Fatigue   . Fatty liver   . Fibromyalgia   . GERD (gastroesophageal reflux disease)   . Headache   . High  triglycerides   . Hyperglycemia   . Hypertension   . Hyperthyroidism   . Moderate asthma   . Obesity   . Paresthesia   . Premature birth     Past Surgical History:  Procedure Laterality Date  . BARTHOLIN GLAND CYST REMOVAL    . COCHLEAR IMPLANT    . COCHLEAR IMPLANT REMOVAL    . LAPAROSCOPIC TUBAL LIGATION Bilateral 03/26/2018   Procedure: LAPAROSCOPIC TUBAL LIGATION;   Surgeon: Rubie Maid, MD;  Location: [facility] ORS;  Service: Gynecology;  Laterality: Bilateral;  with peritoneal biopsies  . LAPAROSCOPY    . MYRINGOTOMY WITH TUBE PLACEMENT    . TUBAL LIGATION  03/26/2018    Family Psychiatric History: I have reviewed family psychiatric history from my progress note on 09/10/2018.  Family History:  Family History  Adopted: Yes  Family history unknown: Yes    Social History: Reviewed social history from my progress note on 09/10/2018. Social History   Socioeconomic History  . Marital status: Single    Spouse name: Not on file  . Number of children: 0  . Years of education: Not on file  . Highest education level: Some college, no degree  Occupational History  . Occupation: disability for hearing loss  Social Needs  . Financial resource strain: Somewhat hard  . Food insecurity    Worry: Never true    Inability: Never true  . Transportation needs    Medical: No    Non-medical: No  Tobacco Use  . Smoking status: Former Smoker    Packs/day: 1.00    Years: 2.00    Pack years: 2.00    Types: Cigarettes    Start date: 11/28/2004    Quit date: 11/28/2006    Years since quitting: 12.8  . Smokeless tobacco: Never Used  Substance and Sexual Activity  . Alcohol use: Not Currently    Alcohol/week: 0.0 standard drinks    Frequency: Never    Comment: occass  . Drug use: No  . Sexual activity: Not Currently    Birth control/protection: Pill    Comment: Seasonale  Lifestyle  . Physical activity    Days per week: 7 days    Minutes per session: 150+ min  . Stress: To some extent  Relationships  . Social Herbalist on phone: Once a week    Gets together: Never    Attends religious service: Never    Active member of club or organization: No    Attends meetings of clubs or organizations: Never    Relationship status: Never married  Other Topics Concern  . Not on file  Social History Narrative   Lives with her adopted  parents    Hearing loss since birth     Allergies:  Allergies  Allergen Reactions  . Budesonide-Formoterol Fumarate Anaphylaxis and Other (See Comments)    Other reaction(s): Other (See Comments) chest pain Chest pain chest pain  . Astelin [Azelastine]     Tingling, numbness, nausea  . Gabapentin Nausea And Vomiting  . Nortriptyline Other (See Comments)    Other reaction(s): Other (See Comments)  . Milnacipran Other (See Comments)    Pressure in eyes  . Tegaderm Ag Mesh [Silver] Itching  . Diclofenac Other (See Comments)    Other reaction(s): Other (See Comments)  . Duloxetine Hcl Other (See Comments)    Other reaction(s): Other (See Comments) Other reaction(s): unknown  . Montelukast Sodium Other (See Comments)    Other reaction(s): wheezing/sob  . Mupirocin  Other (See Comments)  . Naproxen Other (See Comments) and Rash  . Naproxen Sodium Rash    mild rash  . Other Rash  . Oxycodone Hcl Rash  . Sulfa Antibiotics Hives and Rash    Metabolic Disorder Labs: Lab Results  Component Value Date   HGBA1C 4.9 01/29/2018   MPG 94 01/29/2018   MPG 97 10/10/2016   No results found for: PROLACTIN Lab Results  Component Value Date   CHOL 131 09/26/2018   TRIG 87 09/26/2018   HDL 45 (L) 09/26/2018   CHOLHDL 2.9 09/26/2018   VLDL 20 10/10/2016   LDLCALC 69 09/26/2018   LDLCALC 195 (H) 06/22/2018   Lab Results  Component Value Date   TSH 2.84 01/29/2018   TSH 1.649 03/02/2016    Therapeutic Level Labs: No results found for: LITHIUM No results found for: VALPROATE No components found for:  CBMZ  Current Medications: Current Outpatient Medications  Medication Sig Dispense Refill  . BOTOX 100 units SOLR injection every 3 (three) months.     . diltiazem (CARDIZEM CD) 120 MG 24 hr capsule Take 120 mg by mouth daily.    . diphenhydrAMINE (BENADRYL) 50 MG tablet Take 50 mg by mouth at bedtime as needed for itching.    . drospirenone-ethinyl estradiol (YAZ) 3-0.02 MG tablet Take by  mouth.    . FOLIC ACID PO Take by mouth.    . ivabradine (CORLANOR) 5 MG TABS tablet Take 1 tab (5 mg total) 2 hours prior to scan. 1 tablet 0  . metoprolol succinate (TOPROL-XL) 25 MG 24 hr tablet Take 3 tablets (75 mg total) by mouth daily. 90 tablet 3  . norethindrone (AYGESTIN) 5 MG tablet TAKE 1 TABLET BY MOUTH EVERY DAY 90 tablet 1  . oxyCODONE-acetaminophen (PERCOCET/ROXICET) 5-325 MG tablet Take by mouth.    Marland Kitchen rOPINIRole (REQUIP) 1 MG tablet Take 1 tablet (1 mg total) by mouth at bedtime. 30 tablet 1  . rosuvastatin (CRESTOR) 20 MG tablet TAKE 1 TABLET BY MOUTH EVERY DAY 90 tablet 0   No current facility-administered medications for this visit.      Musculoskeletal: Strength & Muscle Tone: UTA Gait & Station: Reports as WNL Patient leans: N/A  Psychiatric Specialty Exam: Review of Systems  Constitutional: Positive for malaise/fatigue.  Psychiatric/Behavioral: Positive for depression. The patient is nervous/anxious and has insomnia.   All other systems reviewed and are negative.   There were no vitals taken for this visit.There is no height or weight on file to calculate BMI.  General Appearance: UTA  Eye Contact:  UTA  Speech:  is mute - ASL used  Volume:  is mute  Mood:  Anxious and Dysphoric  Affect:  UTA  Thought Process:  Goal Directed and Descriptions of Associations: Intact  Orientation:  Full (Time, Place, and Person)  Thought Content: Logical   Suicidal Thoughts:  No  Homicidal Thoughts:  No  Memory:  Immediate;   Fair Recent;   Fair Remote;   Fair  Judgement:  Fair  Insight:  Fair  Psychomotor Activity:  UTA  Concentration:  Concentration: Fair and Attention Span: Fair  Recall:  [employer] of Knowledge: Fair  Language: Fair  Akathisia:  No  Handed:  Right  AIMS (if indicated): denies tremors, rigidity  Assets:  Communication Skills Desire for Improvement Housing Social Support  ADL's:  Intact  Cognition: WNL  Sleep:  Restless    Screenings: GAD-7     Office Visit from 09/25/2018 in [facility]  [facility] Office Visit from 06/22/2018 in [facility] Office Visit from 03/19/2018 in [facility] Office Visit from 01/29/2018 in [facility]  Total GAD-7 Score  14  7  12  7     PHQ2-9     Office Visit from 09/25/2018 in [facility] Office Visit from 06/22/2018 in [facility] Office Visit from 03/19/2018 in [facility] Office Visit from 01/29/2018 in [facility] Office Visit from 04/11/2017 in [facility]  PHQ-2 Total Score  3  2  2  2  1   PHQ-9 Total Score  17  12  8  9   -       Assessment and Plan: Takiah is a 33 year old Caucasian female who is single, unemployed, lives in [place], has a history of depression, anxiety, multiple medical problems, is deaf and mute was evaluated by phone today.  Patient continues to struggle with mood and sleep problems however reports she wants to undergo cardiology evaluation since she has cardiac problems prior to making any more medication changes.  However she is willing to restart Requip which she has tried in the past for her restlessness at night.  Plan as noted below.  Plan MDD-unstable Discontinue venlafaxine-tapered off. Patient wants to wait prior to adding a new medication.  Discussed medications like Viibryd, Trintellix. Continue l-methylfolate 7.5 mg p.o. daily for folic acid conversion protection.  Social anxiety disorder-some progress Continue CBT with [name].  Sleep problems-possible restless leg syndrome.  She also has excessive daytime sleepiness however had sleep study done a year ago which did not show obstructive sleep apnea.  However the sleep study did show restless leg syndrome per patient report. Restart Requip-1 mg p.o. nightly.  [employer] testing results were reviewed prior to making  medication changes.  Follow-up in clinic in 1 month or sooner if needed.  December 1 at 2 PM  I have spent atleast 15 minutes non face to face with patient today. More than 50 % of the time was spent for psychoeducation and supportive psychotherapy and care coordination. This note was generated in part or whole with voice recognition software. Voice recognition is usually quite accurate but there are transcription errors that can and very often do occur. I apologize for any typographical errors that were not detected and corrected.        [place], MD 09/30/2019, 5:16 PM

## 2019-10-01 ENCOUNTER — Encounter: Payer: Self-pay | Admitting: Family Medicine

## 2019-10-01 ENCOUNTER — Ambulatory Visit (INDEPENDENT_AMBULATORY_CARE_PROVIDER_SITE_OTHER): Payer: Medicare Other | Admitting: Family Medicine

## 2019-10-01 VITALS — BP 118/68 | HR 89 | Temp 97.5°F | Resp 16 | Ht 63.0 in | Wt 176.4 lb

## 2019-10-01 DIAGNOSIS — F331 Major depressive disorder, recurrent, moderate: Secondary | ICD-10-CM | POA: Diagnosis not present

## 2019-10-01 DIAGNOSIS — R3915 Urgency of urination: Secondary | ICD-10-CM

## 2019-10-01 DIAGNOSIS — R1030 Lower abdominal pain, unspecified: Secondary | ICD-10-CM

## 2019-10-01 DIAGNOSIS — R002 Palpitations: Secondary | ICD-10-CM | POA: Diagnosis not present

## 2019-10-01 DIAGNOSIS — R197 Diarrhea, unspecified: Secondary | ICD-10-CM | POA: Diagnosis not present

## 2019-10-01 DIAGNOSIS — I208 Other forms of angina pectoris: Secondary | ICD-10-CM

## 2019-10-01 DIAGNOSIS — R1912 Hyperactive bowel sounds: Secondary | ICD-10-CM

## 2019-10-01 DIAGNOSIS — R198 Other specified symptoms and signs involving the digestive system and abdomen: Secondary | ICD-10-CM

## 2019-10-01 LAB — HEMOCCULT GUIAC POC 1CARD (OFFICE): Fecal Occult Blood, POC: NEGATIVE

## 2019-10-01 MED ORDER — CIPROFLOXACIN HCL 500 MG PO TABS: 500.0000 mg | ORAL_TABLET | Freq: Two times a day (BID) | ORAL | 0 refills | Status: DC

## 2019-10-01 MED ORDER — METRONIDAZOLE 500 MG PO TABS: 500.0000 mg | ORAL_TABLET | Freq: Two times a day (BID) | ORAL | 0 refills | Status: DC

## 2019-10-01 NOTE — Progress Notes (Signed)
Name: Yesenia Wood   MRN: [id number]    DOB: 22-Jan-1986   Date:10/01/2019       Progress Note  Subjective  Chief Complaint  Chief Complaint  Patient presents with  . Left Side Pain    Onset- 1 week, Lower left side is the worst-worst when she eats, bowel movement and any movement. Sore like muscle pain and more sensitive.   . Tachycardia    Seeing Cardiologist Dr. Shea Evans to have blood work and check her heart    HPI  Lower abdominal pain: symptoms started about one week ago. Initially discomfort and pain with movement, it resolved for a few days, but now is back with pain across the lower abdomen but worse on the LLQ. Pain is constant, described as cramping and discomfort. Pain is aggravated with movement, eating and bowel movements. Difficulty getting comfortable at night/difficulty sleeping. She denies blood or mucus on her stools. She has noticed increase in bowel frequency, urgency and softer than usual. No fever, chills or vomiting. She has noticed some nausea. She denies bladder problems such as dysuria but has noticed urinary frequency and lower back pain  . She has also noticed increase in thirsty. Appetite is normal but she is afraid to eat   Tachycardia: seeing Cardiologist, had recent labs, no anemia, calcium is elevated we will recheck it today with parathyroid hormone   MDD: under the care of Dr. Elna Breslow, off medications because it was not working , and is going to have cardiac evaluation , during her next visit they will discuss starting a new medication. Phq9 is better than one year ago. She has follow up with Dr. Elna Breslow next month. Denies suicidal thoughts or ideation.    Patient Active Problem List   Diagnosis Date Noted  . RLS (restless legs syndrome) 09/30/2019  . MDD (major depressive disorder), recurrent episode, moderate (HCC) 07/17/2019  . Social anxiety disorder 07/17/2019  . 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 OSA (obstructive sleep apnea) 10/10/2016  . Insomnia due to mental  condition 10/10/2016  . Dissocial personality disorder (HCC) 06/07/2016  . Sleep disturbance 05/10/2016  . Chronic migraine without aura 03/29/2016  . Costochondral chest pain 12/28/2015  . Pain in the chest 06/09/2015  . Palpitations 06/09/2015  . Deaf 05/16/2015  . B12 deficiency 05/16/2015  . Cochlear implant status 05/16/2015  . Fatty infiltration of liver 05/16/2015  . Fibromyalgia syndrome 05/16/2015  . Gastro-esophageal reflux disease without esophagitis 05/16/2015  . Generalized headache 05/16/2015  . Right-sided Bell's palsy 05/16/2015  . H/O: depression 05/16/2015  . Hypertriglyceridemia 05/16/2015  . Blood glucose elevated 05/16/2015  . Irregular menstrual cycle 05/16/2015  . Overweight 05/16/2015  . Paresthesia 05/16/2015  . Disorder of labyrinth 05/16/2015  . Dyspnea 10/07/2014  . Moderate persistent intrinsic asthma without status asthmaticus without complication 10/07/2014  . Migraine 08/05/2013  . Allergic rhinitis 07/01/2013  . 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 Obesity (BMI 30-39.9) 07/01/2013  . Unspecified asthma, uncomplicated 07/01/2013  . Bartholin gland cyst 09/16/2008    Past Surgical History:  Procedure Laterality Date  . BARTHOLIN GLAND CYST REMOVAL    . COCHLEAR IMPLANT    . COCHLEAR IMPLANT REMOVAL    . LAPAROSCOPIC TUBAL LIGATION Bilateral 03/26/2018   Procedure: LAPAROSCOPIC TUBAL LIGATION;  Surgeon: Hildred Laser, MD;  Location: [facility] ORS;  Service: Gynecology;  Laterality: Bilateral;  with peritoneal biopsies  . LAPAROSCOPY    . MYRINGOTOMY WITH TUBE PLACEMENT    . TUBAL LIGATION  03/26/2018    Family  History  Adopted: Yes  Family history unknown: Yes    Social History   Socioeconomic History  . Marital status: Single    Spouse name: Not on file  . Number of children: 0  . Years of education: Not on file  . Highest education level: Some college, no degree  Occupational History  . Occupation: disability for hearing loss  Social  Needs  . Financial resource strain: Somewhat hard  . Food insecurity    Worry: Never true    Inability: Never true  . Transportation needs    Medical: No    Non-medical: No  Tobacco Use  . Smoking status: Former Smoker    Packs/day: 1.00    Years: 2.00    Pack years: 2.00    Types: Cigarettes    Start date: 11/28/2004    Quit date: 11/28/2006    Years since quitting: 12.8  . Smokeless tobacco: Never Used  Substance and Sexual Activity  . Alcohol use: Not Currently    Alcohol/week: 0.0 standard drinks    Frequency: Never    Comment: occass  . Drug use: No  . Sexual activity: Not Currently    Birth control/protection: Pill    Comment: Seasonale  Lifestyle  . Physical activity    Days per week: 7 days    Minutes per session: 150+ min  . Stress: To some extent  Relationships  . Social Herbalist on phone: Once a week    Gets together: Never    Attends religious service: Never    Active member of club or organization: No    Attends meetings of clubs or organizations: Never    Relationship status: Never married  . Intimate partner violence    Fear of current or ex partner: No    Emotionally abused: No    Physically abused: No    Forced sexual activity: No  Other Topics Concern  . Not on file  Social History Narrative   Lives with her adopted  parents   Hearing loss since birth      Current Outpatient Medications:  .  BOTOX 100 units SOLR injection, every 3 (three) months. , Disp: , Rfl:  .  diphenhydrAMINE (BENADRYL) 50 MG tablet, Take 50 mg by mouth at bedtime as needed for itching., Disp: , Rfl:  .  drospirenone-ethinyl estradiol (YAZ) 3-0.02 MG tablet, Take by mouth., Disp: , Rfl:  .  FOLIC ACID PO, Take by mouth., Disp: , Rfl:  .  ivabradine (CORLANOR) 5 MG TABS tablet, Take 1 tab (5 mg total) 2 hours prior to scan., Disp: 1 tablet, Rfl: 0 .  metoprolol succinate (TOPROL-XL) 25 MG 24 hr tablet, Take 3 tablets (75 mg total) by mouth daily., Disp: 90  tablet, Rfl: 3 .  norethindrone (AYGESTIN) 5 MG tablet, TAKE 1 TABLET BY MOUTH EVERY DAY (Patient taking differently: No sig reported), Disp: 90 tablet, Rfl: 1 .  rOPINIRole (REQUIP) 1 MG tablet, Take 1 tablet (1 mg total) by mouth at bedtime., Disp: 30 tablet, Rfl: 1 .  rosuvastatin (CRESTOR) 20 MG tablet, TAKE 1 TABLET BY MOUTH EVERY DAY, Disp: 90 tablet, Rfl: 0 .  diltiazem (CARDIZEM CD) 120 MG 24 hr capsule, Take 120 mg by mouth daily., Disp: , Rfl:  .  oxyCODONE-acetaminophen (PERCOCET/ROXICET) 5-325 MG tablet, Take by mouth., Disp: , Rfl:   Allergies  Allergen Reactions  . Budesonide-Formoterol Fumarate Anaphylaxis and Other (See Comments)    Other reaction(s): Other (See Comments) chest  pain Chest pain chest pain  . Astelin [Azelastine]     Tingling, numbness, nausea  . Gabapentin Nausea And Vomiting  . Nortriptyline Other (See Comments)    Other reaction(s): Other (See Comments)  . Milnacipran Other (See Comments)    Pressure in eyes  . Tegaderm Ag Mesh [Silver] Itching  . Diclofenac Other (See Comments)    Other reaction(s): Other (See Comments)  . Duloxetine Hcl Other (See Comments)    Other reaction(s): Other (See Comments) Other reaction(s): unknown  . Montelukast Sodium Other (See Comments)    Other reaction(s): wheezing/sob  . Mupirocin Other (See Comments)  . Naproxen Other (See Comments) and Rash  . Naproxen Sodium Rash    mild rash  . Other Rash  . Oxycodone Hcl Rash  . Sulfa Antibiotics Hives and Rash    I personally reviewed active problem list, medication list, allergies, family history, social history, health maintenance with the patient/caregiver today.   ROS  Constitutional: Negative for fever or weight change.  Respiratory: Negative for cough and shortness of breath.   Cardiovascular: Negative for chest pain, positive for  palpitations.  Gastrointestinal: Positive  for abdominal pain and bowel changes.  Musculoskeletal: Negative for gait problem  or joint swelling.  Skin: Negative for rash.  Neurological: Negative for dizziness , positive for intermittent headache.  No other specific complaints in a complete review of systems (except as listed in HPI above).  Objective  Vitals:   10/01/19 1121  BP: 118/68  Pulse: 89  Resp: 16  Temp: (!) 97.5 F (36.4 C)  TempSrc: Temporal  SpO2: 96%  Weight: 176 lb 6.4 oz (80 kg)  Height:  (1.6 m)    Body mass index is 31.25 kg/m.  Physical Exam  Constitutional: Patient appears well-developed and well-nourished. Obese  No distress.  HEENT: head atraumatic, normocephalic, pupils equal and reactive to light Cardiovascular: Normal rate, regular rhythm and normal heart sounds.  No murmur heard. No BLE edema. Pulmonary/Chest: Effort normal and breath sounds normal. No respiratory distress. Abdominal: Soft.  There is mild lower abdominal pain, increase in bowel sounds throughout, rectal exam showed yellow stools, very small amount, hemoccult negative, normal tonus  Psychiatric: Patient has a normal mood and affect. behavior is normal. Judgment and thought content normal.  Recent Results (from the past 2160 hour(s))  CBC     Status: None   Collection Time: 09/24/19  3:31 PM  Result Value Ref Range   WBC 9.5 4.0 - 10.5 K/uL   RBC 4.96 3.87 - 5.11 MIL/uL   Hemoglobin 14.3 12.0 - 15.0 g/dL   HCT 29.5 62.1 - 30.8 %   MCV 86.5 80.0 - 100.0 fL   MCH 28.8 26.0 - 34.0 pg   MCHC 33.3 30.0 - 36.0 g/dL   RDW 65.7 84.6 - 96.2 %   Platelets 296 150 - 400 K/uL   nRBC 0.0 0.0 - 0.2 %    Comment: Performed at [facility], [address] [place], [place] [address]  Basic metabolic panel     Status: Abnormal   Collection Time: 09/24/19  3:31 PM  Result Value Ref Range   Sodium 138 135 - 145 mmol/L   Potassium 4.5 3.5 - 5.1 mmol/L   Chloride 99 98 - 111 mmol/L   CO2 28 22 - 32 mmol/L   Glucose, Bld 95 70 - 99 mg/dL   BUN 16 6 - 20 mg/dL   Creatinine, Ser 1.32 0.44 - 1.00 mg/dL  Calcium 11.3 (H) 8.9 - 10.3 mg/dL   GFR calc non Af Amer >60 >60 mL/min   GFR calc Af Amer >60 >60 mL/min   Anion gap 11 5 - 15    Comment: Performed at [facility], [address] [place], [place] [address]     PHQ2/9: [facility] 10/01/2019 09/25/2018 06/22/2018 03/19/2018 01/29/2018  Decreased Interest 1 2 1 1 1   Down, Depressed, Hopeless 1 1 1 1 1   PHQ - 2 Score 2 3 2 2 2   Altered sleeping 3 3 3 3 3   Tired, decreased energy 3 3 3 1 3   Change in appetite 1 2 0 0 0  Feeling bad or failure about yourself  1 1 0 0 0  Trouble concentrating 1 2 2 1  0  Moving slowly or fidgety/restless 2 3 2 1 1   Suicidal thoughts 0 0 0 0 0  PHQ-9 Score 13 17 12 8 9   Difficult doing work/chores Somewhat difficult Somewhat difficult Somewhat difficult Not difficult at all Somewhat difficult  Some recent data might be hidden    phq 9 is positive   Fall Risk: Fall Risk  10/01/2019 09/25/2018 06/22/2018 03/19/2018 10/26/2017  Falls in the past year? 0 No No No No  Number falls in past yr: 0 - - - -  Injury with Fall? 0 - - - -  Comment - - - - -  Risk Factor Category  - - - - -    Functional Status Survey: Is the patient deaf or have difficulty hearing?: Yes Does the patient have difficulty seeing, even when wearing glasses/contacts?: No Does the patient have difficulty concentrating, remembering, or making decisions?: No Does the patient have difficulty walking or climbing stairs?: No Does the patient have difficulty dressing or bathing?: No Does the patient have difficulty doing errands alone such as visiting a doctor's office or shopping?: No    Assessment & Plan  1. Lower abdominal pain  Discussed gastroenteritis, versus possible diverticulitis, since no fever or signs of dehydration with normal labs done last week ( no elevation of white count) we will go ahead and treat with antibiotics and if no improvement contact us back - ciprofloxacin (CIPRO) 500 MG tablet;  Take 1 tablet (500 mg total) by mouth 2 (two) times daily.  Dispense: 14 tablet; Refill: 0 - metroNIDAZOLE (FLAGYL) 500 MG tablet; Take 1 tablet (500 mg total) by mouth 2 (two) times daily.  Dispense: 14 tablet; Refill: 0 - POCT Occult Blood Stool  2. Hypercalcemia  - PTH, intact and calcium  3. Moderate recurrent major depression (HCC)  Under the care of Dr. Elna BreslowEappen, off medication at this time  4. Palpitation  She has follow up with cardiologist and studies will be done tomorrow  5. Bowel sounds hyperactive   6. Urinary urgency  - CULTURE, URINE COMPREHENSIVE  7. Diarrhea, unspecified type  - POCT Occult Blood Stool  8. Change in bowel movement  - POCT Occult Blood Stool

## 2019-10-02 ENCOUNTER — Other Ambulatory Visit: Payer: Self-pay

## 2019-10-02 ENCOUNTER — Ambulatory Visit
Admission: RE | Admit: 2019-10-02 | Discharge: 2019-10-02 | Disposition: A | Discharge status: Home / Self Care | Payer: Medicare Other | Source: Ambulatory Visit | Attending: Physician Assistant | Admitting: Physician Assistant

## 2019-10-02 DIAGNOSIS — I208 Other forms of angina pectoris: Secondary | ICD-10-CM | POA: Diagnosis not present

## 2019-10-02 LAB — PTH, INTACT AND CALCIUM
Calcium: 9.9 mg/dL (ref 8.6–10.2)
PTH: 13 pg/mL — ABNORMAL LOW (ref 14–64)

## 2019-10-02 MED ORDER — METOPROLOL TARTRATE 5 MG/5ML IV SOLN
5.0000 mg | INTRAVENOUS | Status: DC | PRN
  Administered 2019-10-02 (×3): 5 mg via INTRAVENOUS

## 2019-10-02 MED ORDER — IOHEXOL 350 MG/ML SOLN
75.0000 mL | Freq: Once | INTRAVENOUS | Status: AC | PRN
  Administered 2019-10-02: 75 mL via INTRAVENOUS

## 2019-10-02 MED ORDER — SODIUM CHLORIDE 0.9 % IV BOLUS
500.0000 mL | Freq: Once | INTRAVENOUS | Status: AC
  Administered 2019-10-02: 500 mL via INTRAVENOUS

## 2019-10-02 MED ORDER — NITROGLYCERIN 0.4 MG SL SUBL
0.8000 mg | SUBLINGUAL_TABLET | Freq: Once | SUBLINGUAL | Status: AC
  Administered 2019-10-02: 0.8 mg via SUBLINGUAL

## 2019-10-02 NOTE — Progress Notes (Signed)
After fluid bolus patient continues to have no complaints. Ambulated in hallway steady gait. Returned to room removed IV and patient ambulatory to exit steady gait.

## 2019-10-02 NOTE — Progress Notes (Signed)
Patient tolerated CT without incident and after stated felt slight dizzy with sign language interpretor at bedside. Gave  Two bottle of waters patient blood pressure 89/56 notified Dr Marlou Porch ordered 0.9 NS bolus 500ML patient feeling better and currently denies dizziness or nausea.

## 2019-10-03 DIAGNOSIS — H5000 Unspecified esotropia: Secondary | ICD-10-CM | POA: Diagnosis not present

## 2019-10-03 LAB — CULTURE, URINE COMPREHENSIVE
MICRO NUMBER:: 1060908
RESULT:: NO GROWTH
SPECIMEN QUALITY:: ADEQUATE

## 2019-10-10 ENCOUNTER — Ambulatory Visit (INDEPENDENT_AMBULATORY_CARE_PROVIDER_SITE_OTHER): Payer: Medicare Other

## 2019-10-10 ENCOUNTER — Ambulatory Visit (INDEPENDENT_AMBULATORY_CARE_PROVIDER_SITE_OTHER): Payer: Medicare Other | Admitting: Family Medicine

## 2019-10-10 ENCOUNTER — Other Ambulatory Visit: Payer: Self-pay

## 2019-10-10 ENCOUNTER — Encounter: Payer: Self-pay | Admitting: Family Medicine

## 2019-10-10 VITALS — BP 100/66 | HR 81 | Temp 97.3°F | Resp 16 | Ht 63.0 in | Wt 179.1 lb

## 2019-10-10 DIAGNOSIS — M797 Fibromyalgia: Secondary | ICD-10-CM | POA: Diagnosis not present

## 2019-10-10 DIAGNOSIS — E785 Hyperlipidemia, unspecified: Secondary | ICD-10-CM | POA: Diagnosis not present

## 2019-10-10 DIAGNOSIS — R1030 Lower abdominal pain, unspecified: Secondary | ICD-10-CM | POA: Diagnosis not present

## 2019-10-10 DIAGNOSIS — R002 Palpitations: Secondary | ICD-10-CM

## 2019-10-10 DIAGNOSIS — Z0000 Encounter for general adult medical examination without abnormal findings: Secondary | ICD-10-CM

## 2019-10-10 DIAGNOSIS — E538 Deficiency of other specified B group vitamins: Secondary | ICD-10-CM

## 2019-10-10 DIAGNOSIS — R202 Paresthesia of skin: Secondary | ICD-10-CM

## 2019-10-10 DIAGNOSIS — F331 Major depressive disorder, recurrent, moderate: Secondary | ICD-10-CM

## 2019-10-10 DIAGNOSIS — I208 Other forms of angina pectoris: Secondary | ICD-10-CM | POA: Diagnosis not present

## 2019-10-10 NOTE — Progress Notes (Addendum)
Subjective:   Yesenia Wood is a 33 y.o. female who presents for Medicare Annual (Subsequent) preventive examination.  Review of Systems:   Cardiac Risk Factors include: dyslipidemia;obesity (BMI >30kg/m2)     Objective:     Vitals: BP 100/66 (BP Location: Left Arm, Patient Position: Sitting, Cuff Size: Normal)    Pulse 81    Temp (!) 97.3 F (36.3 C) (Temporal)    Resp 16    Ht 5\' 3"  (1.6 m)    Wt 179 lb 1.6 oz (81.2 kg)    LMP 09/23/2019 Comment: Spotting last week   SpO2 98%    BMI 31.73 kg/m   Body mass index is 31.73 kg/m.  Advanced Directives 03/26/2018 03/20/2018 12/11/2017 04/11/2017 11/24/2016 10/10/2016 06/07/2016  Does Patient Have a Medical Advance Directive? No No No No No No No  Would patient like information on creating a medical advance directive? - No - Patient declined No - Patient declined - No - Patient declined No - patient declined information No - patient declined information  Some encounter information is confidential and restricted. Go to Review Flowsheets activity to see all data.    Tobacco Social History   Tobacco Use  Smoking Status Former Smoker   Packs/day: 1.00   Years: 2.00   Pack years: 2.00   Types: Cigarettes   Start date: 11/28/2004   Quit date: 11/28/2006   Years since quitting: 12.8  Smokeless Tobacco Never Used     Counseling given: Not Answered   Clinical Intake:  Pre-visit preparation completed: Yes  Pain : 0-10 Pain Score: 5  Pain Type: Chronic pain(fibromyalgia pain) Pain Location: Shoulder(neck, back, hips) Pain Descriptors / Indicators: Aching, Discomfort Pain Onset: More than a month ago Pain Frequency: Constant     BMI - recorded: 31.73 Nutritional Status: BMI > 30  Obese Nutritional Risks: Nausea/ vomitting/ diarrhea(diarrhea, possibly medication related due to antibiotics) Diabetes: No  How often do you need to have someone help you when you read instructions, pamphlets, or other written materials from  your doctor or pharmacy?: 1 - Never  Interpreter Needed?: Yes Interpreter Agency: [facility] Interpreter Name: Melinda Crutchichard O'Donnell Patient Declined Interpreter : No Patient signed [place] waiver: No  Information entered by :: Reather LittlerKasey Talayeh Bruinsma LPN  Past Medical History:  Diagnosis Date   ADHD (attention deficit hyperactivity disorder)    B12 deficiency    Bell's palsy    right sided   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   Fatigue    Fatty liver    Fibromyalgia    GERD (gastroesophageal reflux disease)    Headache    High triglycerides    Hyperglycemia    Hypertension    Hyperthyroidism    Moderate asthma    Obesity    Paresthesia    Premature birth    Past Surgical History:  Procedure Laterality Date   BARTHOLIN GLAND CYST REMOVAL     COCHLEAR IMPLANT     COCHLEAR IMPLANT REMOVAL     LAPAROSCOPIC TUBAL LIGATION Bilateral 03/26/2018   Procedure: LAPAROSCOPIC TUBAL LIGATION;  Surgeon: Hildred Laserherry, Anika, MD;  Location: [facility] ORS;  Service: Gynecology;  Laterality: Bilateral;  with peritoneal biopsies   LAPAROSCOPY     MYRINGOTOMY WITH TUBE PLACEMENT     TUBAL LIGATION  03/26/2018   Family History  Adopted: Yes  Family history unknown: Yes  Social History   Socioeconomic History   Marital status: Single    Spouse name: Not on file   Number of children: 0   Years of education: Not on file   Highest education level: Some college, no degree  Occupational History   Occupation: disability for hearing loss  Social Designer, fashion/clothing strain: Somewhat hard   Food insecurity    Worry: Never true    Inability: Never true   Transportation needs    Medical: No    Non-medical: No  Tobacco Use   Smoking status: Former Smoker    Packs/day: 1.00    Years: 2.00    Pack years: 2.00    Types: Cigarettes    Start date: 11/28/2004    Quit  date: 11/28/2006    Years since quitting: 12.8   Smokeless tobacco: Never Used  Substance and Sexual Activity   Alcohol use: Not Currently    Alcohol/week: 0.0 standard drinks    Frequency: Never    Comment: occass   Drug use: No   Sexual activity: Not Currently    Birth control/protection: Pill    Comment: Seasonale  Lifestyle   Physical activity    Days per week: 7 days    Minutes per session: 20 min   Stress: To some extent  Relationships   Social connections    Talks on phone: Once a week    Gets together: Never    Attends religious service: Never    Active member of club or organization: No    Attends meetings of clubs or organizations: Never    Relationship status: Never married  Other Topics Concern   Not on file  Social History Narrative   Lives with her adopted  parents   Hearing loss since birth     Outpatient Encounter Medications as of 10/10/2019  Medication Sig   Acetaminophen-Caff-Pyrilamine (MIDOL COMPLETE PO) Take by mouth.   BOTOX 100 units SOLR injection every 3 (three) months.    ciprofloxacin (CIPRO) 500 MG tablet Take 1 tablet (500 mg total) by mouth 2 (two) times daily.   diphenhydrAMINE (BENADRYL) 50 MG tablet Take 50 mg by mouth at bedtime as needed for itching.   FOLIC ACID PO Take by mouth.   metoprolol succinate (TOPROL-XL) 25 MG 24 hr tablet Take 3 tablets (75 mg total) by mouth daily.   metroNIDAZOLE (FLAGYL) 500 MG tablet Take 1 tablet (500 mg total) by mouth 2 (two) times daily.   norethindrone (AYGESTIN) 5 MG tablet TAKE 1 TABLET BY MOUTH EVERY DAY (Patient taking differently: No sig reported)   rosuvastatin (CRESTOR) 20 MG tablet TAKE 1 TABLET BY MOUTH EVERY DAY   ivabradine (CORLANOR) 5 MG TABS tablet Take 1 tab (5 mg total) 2 hours prior to scan. (Patient not taking: Reported on 10/10/2019)   rOPINIRole (REQUIP) 1 MG tablet Take 1 tablet (1 mg total) by mouth at bedtime. (Patient not taking: Reported on 10/10/2019)    [DISCONTINUED] diltiazem (CARDIZEM CD) 120 MG 24 hr capsule Take 120 mg by mouth daily.   [DISCONTINUED] drospirenone-ethinyl estradiol (YAZ) 3-0.02 MG tablet Take by mouth.   [DISCONTINUED] oxyCODONE-acetaminophen (PERCOCET/ROXICET) 5-325 MG tablet Take by mouth.   No facility-administered encounter medications on file as of 10/10/2019.     Activities of Daily Living In your present state of health, do you have any difficulty performing the following activities: 10/10/2019 10/01/2019  Hearing? Y Y  Comment pt is deaf -  Vision? N N  Difficulty  concentrating or making decisions? N N  Walking or climbing stairs? N N  Dressing or bathing? N N  Doing errands, shopping? N N  Preparing Food and eating ? N -  Using the Toilet? N -  In the past six months, have you accidently leaked urine? N -  Do you have problems with loss of bowel control? N -  Managing your Medications? N -  Managing your Finances? N -  Housekeeping or managing your Housekeeping? N -  Some recent data might be hidden    Patient Care Team: Alba Cory, MD as PCP - General (Family Medicine) Iran Ouch, MD as PCP - Cardiology (Cardiology) [place] Ouch, MD as Consulting Physician (Cardiology) Hildred Laser, MD as Referring Physician (Obstetrics and Gynecology) Shane Crutch, MD (Inactive) as Consulting Physician (Pulmonary Disease)    Assessment:   This is a routine wellness examination for Lashane.  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 1 0 No No No  Number falls  in past yr: 1 0 - - -  Injury with Fall? 0 0 - - -  Comment - - - - -  Risk Factor Category  - - - - -  Follow up Falls prevention discussed - - - -   FALL RISK PREVENTION PERTAINING TO THE HOME:  Any stairs in or around the home? Yes  If so, do they handrails? Yes   Home free of loose throw rugs in walkways, pet beds, electrical cords, etc? Yes  Adequate lighting in your home to reduce risk of falls? Yes   ASSISTIVE DEVICES UTILIZED TO PREVENT FALLS:  Life alert? No  Use of a cane, walker or w/c? No  Grab bars in the bathroom? Yes  Shower chair or bench in shower? Yes  Elevated toilet seat or a handicapped toilet? No   DME ORDERS:  DME order needed?  No   TIMED UP AND GO:  Was the test performed? Yes .  Length of time to ambulate 10 feet: 5 sec.   GAIT:  Appearance of gait: Gait stead-fast and without the use of an assistive device.   Education: Fall risk prevention has been discussed.  Intervention(s) required? No   Depression Screen PHQ 2/9 Scores 10/10/2019 10/01/2019 09/25/2018 06/22/2018  PHQ - 2 Score 2 2 3 2   PHQ- 9 Score 12 13 17 12      Cognitive Function        Immunization History  Administered Date(s) Administered   Influenza,inj,Quad PF,6+ Mos 07/20/2015, 09/01/2017, 09/25/2018, 08/09/2019   Influenza-Unspecified 09/12/2014, 08/24/2016   Pneumococcal Conjugate-13 11/24/2014   Pneumococcal Polysaccharide-23 11/24/2014   Tdap 11/28/2009, 03/16/2016    Qualifies for Shingles Vaccine? No    Tdap: Up to date  Flu Vaccine: Up to date  Pneumococcal Vaccine: Up to date  Screening Tests Health Maintenance  Topic Date Due   PAP SMEAR-Modifier  04/11/2020   TETANUS/TDAP  03/16/2026   INFLUENZA VACCINE  Completed   HIV Screening  Completed    Cancer Screenings:  Colorectal Screening: due at age  Mammogram: Completed 61 as baseline.  Bone Density: due age 18   Lung Cancer Screening: (Low Dose CT Chest recommended if Age  53-80 years, 30 pack-year currently smoking OR have quit w/in 15years.) does not qualify.    Additional Screening:  Hepatitis C Screening: does not qualify;  Vision Screening: Recommended annual ophthalmology exams for early detection of glaucoma and other disorders of the eye. Is the patient up to date with their annual eye exam?  Yes  Who is the provider or what is the name of the [facility] in which the pt att[facility]  Dental Screening: Recommended annual dental exams for proper oral hygiene  Community Resource Referral:  CRR required this visit?  No      Plan:     I have personally reviewed and addressed the Medicare Annual Wellness questionnaire and have noted the following in the patients chart:  A. Medical and social history B. Use of alcohol, tobacco or illicit drugs  C. Current medications and supplements D. Functional ability and status E.  Nutritional status F.  Physical activity G. Advance directives H. List of other physicians I.  Hospitalizations, surgeries, and ER visits in previous 12 months J.  Vitals K. Screenings such as hearing and vision if needed, cognitive and depression L. Referrals and appointments   In addition, I have reviewed and discussed with patient certain preventive protocols, quality metrics, and best practice recommendations. A written personalized care plan for preventive services as well as general preventive health recommendations were provided to patient.   Signed,  Reather Littler, LPN Nurse Health Advisor   Nurse Notes: pt accompanied during visit by sign language interpreter. Same day appt for follow up with Dr. Carlynn Purl.

## 2019-10-10 NOTE — Patient Instructions (Signed)
Yesenia Wood , Thank you for taking time to come for your Medicare Wellness Visit. I appreciate your ongoing commitment to your health goals. Please review the following plan we discussed and let me know if I can assist you in the future.   Screening recommendations/referrals: Mammogram: done 2015 Recommended yearly ophthalmology/optometry visit for glaucoma screening and checkup Recommended yearly dental visit for hygiene and checkup  Vaccinations: Influenza vaccine: done 08/09/19 Pneumococcal vaccine: done 11/24/14 Tdap vaccine: done 03/16/16  Conditions/risks identified: Continue healthy eating and physical activity   Next appointment: Please follow up in one year for your Medicare Annual Wellness visit.    Preventive Care 40-64 Years, Female Preventive care refers to lifestyle choices and visits with your health care provider that can promote health and wellness. What does preventive care include?  A yearly physical exam. This is also called an annual well check.  Dental exams once or twice a year.  Routine eye exams. Ask your health care provider how often you should have your eyes checked.  Personal lifestyle choices, including:  Daily care of your teeth and gums.  Regular physical activity.  Eating a healthy diet.  Avoiding tobacco and drug use.  Limiting alcohol use.  Practicing safe sex.  Taking low-dose aspirin daily starting at age 17.  Taking vitamin and mineral supplements as recommended by your health care provider. What happens during an annual well check? The services and screenings done by your health care provider during your annual well check will depend on your age, overall health, lifestyle risk factors, and family history of disease. Counseling  Your health care provider may ask you questions about your:  Alcohol use.  Tobacco use.  Drug use.  Emotional well-being.  Home and relationship well-being.  Sexual activity.  Eating habits.   Work and work Statistician.  Method of birth control.  Menstrual cycle.  Pregnancy history. Screening  You may have the following tests or measurements:  Height, weight, and BMI.  Blood pressure.  Lipid and cholesterol levels. These may be checked every 5 years, or more frequently if you are over 6 years old.  Skin check.  Lung cancer screening. You may have this screening every year starting at age 92 if you have a 30-pack-year history of smoking and currently smoke or have quit within the past 15 years.  Fecal occult blood test (FOBT) of the stool. You may have this test every year starting at age 72.  Flexible sigmoidoscopy or colonoscopy. You may have a sigmoidoscopy every 5 years or a colonoscopy every 10 years starting at age 33.  Hepatitis C blood test.  Hepatitis B blood test.  Sexually transmitted disease (STD) testing.  Diabetes screening. This is done by checking your blood sugar (glucose) after you have not eaten for a while (fasting). You may have this done every 1-3 years.  Mammogram. This may be done every 1-2 years. Talk to your health care provider about when you should start having regular mammograms. This may depend on whether you have a family history of breast cancer.  BRCA-related cancer screening. This may be done if you have a family history of breast, ovarian, tubal, or peritoneal cancers.  Pelvic exam and Pap test. This may be done every 3 years starting at age 84. Starting at age 29, this may be done every 5 years if you have a Pap test in combination with an HPV test.  Bone density scan. This is done to screen for osteoporosis. You may have this  scan if you are at high risk for osteoporosis. Discuss your test results, treatment options, and if necessary, the need for more tests with your health care provider. Vaccines  Your health care provider may recommend certain vaccines, such as:  Influenza vaccine. This is recommended every year.   Tetanus, diphtheria, and acellular pertussis (Tdap, Td) vaccine. You may need a Td booster every 10 years.  Zoster vaccine. You may need this after age 57.  Pneumococcal 13-valent conjugate (PCV13) vaccine. You may need this if you have certain conditions and were not previously vaccinated.  Pneumococcal polysaccharide (PPSV23) vaccine. You may need one or two doses if you smoke cigarettes or if you have certain conditions. Talk to your health care provider about which screenings and vaccines you need and how often you need them. This information is not intended to replace advice given to you by your health care provider. Make sure you discuss any questions you have with your health care provider. Document Released: 12/11/2015 Document Revised: 08/03/2016 Document Reviewed: 09/15/2015 Elsevier Interactive Patient Education  2017 [place] Prevention in the Home Falls can cause injuries. They can happen to people of all ages. There are many things you can do to make your home safe and to help prevent falls. What can I do on the outside of my home?  Regularly fix the edges of walkways and driveways and fix any cracks.  Remove anything that might make you trip as you walk through a door, such as a raised step or threshold.  Trim any bushes or trees on the path to your home.  Use bright outdoor lighting.  Clear any walking paths of anything that might make someone trip, such as rocks or tools.  Regularly check to see if handrails are loose or broken. Make sure that both sides of any steps have handrails.  Any raised decks and porches should have guardrails on the edges.  Have any leaves, snow, or ice cleared regularly.  Use sand or salt on walking paths during winter.  Clean up any spills in your garage right away. This includes oil or grease spills. What can I do in the bathroom?  Use night lights.  Install grab bars by the toilet and in the tub and shower. Do not  use towel bars as grab bars.  Use non-skid mats or decals in the tub or shower.  If you need to sit down in the shower, use a plastic, non-slip stool.  Keep the floor dry. Clean up any water that spills on the floor as soon as it happens.  Remove soap buildup in the tub or shower regularly.  Attach bath mats securely with double-sided non-slip rug tape.  Do not have throw rugs and other things on the floor that can make you trip. What can I do in the bedroom?  Use night lights.  Make sure that you have a light by your bed that is easy to reach.  Do not use any sheets or blankets that are too big for your bed. They should not hang down onto the floor.  Have a firm chair that has side arms. You can use this for support while you get dressed.  Do not have throw rugs and other things on the floor that can make you trip. What can I do in the kitchen?  Clean up any spills right away.  Avoid walking on wet floors.  Keep items that you use a lot in easy-to-reach places.  If you need to reach something above you, use a strong step stool that has a grab bar.  Keep electrical cords out of the way.  Do not use floor polish or wax that makes floors slippery. If you must use wax, use non-skid floor wax.  Do not have throw rugs and other things on the floor that can make you trip. What can I do with my stairs?  Do not leave any items on the stairs.  Make sure that there are handrails on both sides of the stairs and use them. Fix handrails that are broken or loose. Make sure that handrails are as long as the stairways.  Check any carpeting to make sure that it is firmly attached to the stairs. Fix any carpet that is loose or worn.  Avoid having throw rugs at the top or bottom of the stairs. If you do have throw rugs, attach them to the floor with carpet tape.  Make sure that you have a light switch at the top of the stairs and the bottom of the stairs. If you do not have them, ask  someone to add them for you. What else can I do to help prevent falls?  Wear shoes that:  Do not have high heels.  Have rubber bottoms.  Are comfortable and fit you well.  Are closed at the toe. Do not wear sandals.  If you use a stepladder:  Make sure that it is fully opened. Do not climb a closed stepladder.  Make sure that both sides of the stepladder are locked into place.  Ask someone to hold it for you, if possible.  Clearly mark and make sure that you can see:  Any grab bars or handrails.  First and last steps.  Where the edge of each step is.  Use tools that help you move around (mobility aids) if they are needed. These include:  Canes.  Walkers.  Scooters.  Crutches.  Turn on the lights when you go into a dark area. Replace any light bulbs as soon as they burn out.  Set up your furniture so you have a clear path. Avoid moving your furniture around.  If any of your floors are uneven, fix them.  If there are any pets around you, be aware of where they are.  Review your medicines with your doctor. Some medicines can make you feel dizzy. This can increase your chance of falling. Ask your doctor what other things that you can do to help prevent falls. This information is not intended to replace advice given to you by your health care provider. Make sure you discuss any questions you have with your health care provider. Document Released: 09/10/2009 Document Revised: 04/21/2016 Document Reviewed: 12/19/2014 Elsevier Interactive Patient Education  2017 Else[employer] Inc[employer]  

## 2019-10-10 NOTE — Progress Notes (Signed)
Name: Yesenia Wood   MRN: [id number]    DOB: 31-Aug-1986   Date:10/10/2019       Progress Note  Subjective  Chief Complaint  Chief Complaint  Patient presents with  . Labs Only   She is here today with interpreter: Melinda Crutch   HPI  Paresthesia right  hand and arm: she states started a few weeks ago, numbness and like needles on her skin the entire right hand and arm, she is getting concern because it is constant. She is not sure about aggravating factors, but seems to improve when she hold her right arm outstretches ( abducted ). Discussed unlikely B12 deficiency , likely nerve , but not sure where the compression is since the entire hand and arm is going numb. She told she has been needling a lot , we can order a NCS  FMS: she states noticing more pain on hips and feet, described as aching like, she asked to see a dietician to discuss weight but explained unlikely covered by insurance, she will let me know if they pay for it   Obesity: she gained 30 lbs since last year, her weight Nov 2019 was 149 in our office, she states she has been more stressed, anxiety is up because of COVID-19 and because of migraine she is avoiding some food groups and has been eating more carbohydrates. She is avoid sweets and trying to stay acitve   Lower abdominal pain: she states getting better , she was seen last week, we gave her cipro and metronidazole and states pain has decreased , she states pain on LLQ completely resolved, but she still has some lower abdominal discomfort. Appetite is still slightly decreased, no nausea or vomiting. She has noticed some loose stools since started on antibiotics   Hyperlipidemia: taking Crestor  SOB with activity and palpitation:  seen by Cardiologist and had CT coronary and it was normal, she is nowon 75 mg of Toprol XL and thinks she is breathing a little better since  Patient Active Problem List   Diagnosis Date Noted  . RLS (restless legs syndrome)  09/30/2019  . MDD (major depressive disorder), recurrent episode, moderate (HCC) 07/17/2019  . Social anxiety disorder 07/17/2019  . 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 OSA (obstructive sleep apnea) 10/10/2016  . 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  . Dissocial personality disorder (HCC) 06/07/2016  . Sleep disturbance 05/10/2016  . Chronic migraine without aura 03/29/2016  . Costochondral chest pain 12/28/2015  . Pain in the chest 06/09/2015  . Palpitations 06/09/2015  . Deaf 05/16/2015  . B12 deficiency 05/16/2015  . Cochlear implant status 05/16/2015  . Fatty infiltration of liver 05/16/2015  . Fibromyalgia syndrome 05/16/2015  . Gastro-esophageal reflux disease without esophagitis 05/16/2015  . Generalized headache 05/16/2015  . Right-sided Bell's palsy 05/16/2015  . H/O: depression 05/16/2015  . Hypertriglyceridemia 05/16/2015  . Blood glucose elevated 05/16/2015  . Irregular menstrual cycle 05/16/2015  . Overweight 05/16/2015  . Paresthesia 05/16/2015  . Disorder of labyrinth 05/16/2015  . Dyspnea 10/07/2014  . Moderate persistent intrinsic asthma without status asthmaticus without complication 10/07/2014  . Migraine 08/05/2013  . Allergic rhinitis 07/01/2013  . 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 Obesity (BMI 30-39.9) 07/01/2013  . Unspecified asthma, uncomplicated 07/01/2013  . Bartholin gland cyst 09/16/2008    Past Surgical History:  Procedure Laterality Date  . BARTHOLIN GLAND CYST REMOVAL    . COCHLEAR IMPLANT    . COCHLEAR IMPLANT REMOVAL    . LAPAROSCOPIC TUBAL LIGATION  Bilateral 03/26/2018   Procedure: LAPAROSCOPIC TUBAL LIGATION;  Surgeon: Hildred Laser, MD;  Location: [facility] ORS;  Service: Gynecology;  Laterality: Bilateral;  with peritoneal biopsies  . LAPAROSCOPY    . MYRINGOTOMY WITH TUBE PLACEMENT    . TUBAL LIGATION  03/26/2018    Family History  Adopted: Yes  Family history unknown: Yes    Social History   Socioeconomic History  . Marital status:  Single    Spouse name: Not on file  . Number of children: 0  . Years of education: Not on file  . Highest education level: Some college, no degree  Occupational History  . Occupation: disability for hearing loss  Social Needs  . Financial resource strain: Somewhat hard  . Food insecurity    Worry: Never true    Inability: Never true  . Transportation needs    Medical: No    Non-medical: No  Tobacco Use  . Smoking status: Former Smoker    Packs/day: 1.00    Years: 2.00    Pack years: 2.00    Types: Cigarettes    Start date: 11/28/2004    Quit date: 11/28/2006    Years since quitting: 12.8  . Smokeless tobacco: Never Used  Substance and Sexual Activity  . Alcohol use: Not Currently    Alcohol/week: 0.0 standard drinks    Frequency: Never    Comment: occass  . Drug use: No  . Sexual activity: Not Currently    Birth control/protection: Pill    Comment: Seasonale  Lifestyle  . Physical activity    Days per week: 7 days    Minutes per session: 20 min  . Stress: To some extent  Relationships  . Social Musician on phone: Once a week    Gets together: Never    Attends religious service: Never    Active member of club or organization: No    Attends meetings of clubs or organizations: Never    Relationship status: Never married  . Intimate partner violence    Fear of current or ex partner: No    Emotionally abused: No    Physically abused: No    Forced sexual activity: No  Other Topics Concern  . Not on file  Social History Narrative   Lives with her adopted  parents   Hearing loss since birth      Current Outpatient Medications:  .  Acetaminophen-Caff-Pyrilamine (MIDOL COMPLETE PO), Take by mouth., Disp: , Rfl:  .  BOTOX 100 units SOLR injection, every 3 (three) months. , Disp: , Rfl:  .  ciprofloxacin (CIPRO) 500 MG tablet, Take 1 tablet (500 mg total) by mouth 2 (two) times daily., Disp: 14 tablet, Rfl: 0 .  diphenhydrAMINE (BENADRYL) 50 MG tablet, Take  50 mg by mouth at bedtime as needed for itching., Disp: , Rfl:  .  FOLIC ACID PO, Take by mouth., Disp: , Rfl:  .  ivabradine (CORLANOR) 5 MG TABS tablet, Take 1 tab (5 mg total) 2 hours prior to scan. (Patient not taking: Reported on 10/10/2019), Disp: 1 tablet, Rfl: 0 .  metoprolol succinate (TOPROL-XL) 25 MG 24 hr tablet, Take 3 tablets (75 mg total) by mouth daily., Disp: 90 tablet, Rfl: 3 .  metroNIDAZOLE (FLAGYL) 500 MG tablet, Take 1 tablet (500 mg total) by mouth 2 (two) times daily., Disp: 14 tablet, Rfl: 0 .  norethindrone (AYGESTIN) 5 MG tablet, TAKE 1 TABLET BY MOUTH EVERY DAY (Patient taking differently: No sig reported), Disp:  90 tablet, Rfl: 1 .  rOPINIRole (REQUIP) 1 MG tablet, Take 1 tablet (1 mg total) by mouth at bedtime. (Patient not taking: Reported on 10/10/2019), Disp: 30 tablet, Rfl: 1 .  rosuvastatin (CRESTOR) 20 MG tablet, TAKE 1 TABLET BY MOUTH EVERY DAY, Disp: 90 tablet, Rfl: 0  Allergies  Allergen Reactions  . Budesonide-Formoterol Fumarate Anaphylaxis and Other (See Comments)    Other reaction(s): Other (See Comments) chest pain Chest pain chest pain  . Astelin [Azelastine]     Tingling, numbness, nausea  . Gabapentin Nausea And Vomiting  . Nortriptyline Other (See Comments)    Other reaction(s): Other (See Comments)  . Milnacipran Other (See Comments)    Pressure in eyes  . Tegaderm Ag Mesh [Silver] Itching  . Diclofenac Other (See Comments)    Other reaction(s): Other (See Comments)  . Duloxetine Hcl Other (See Comments)    Other reaction(s): Other (See Comments) Other reaction(s): unknown  . Montelukast Sodium Other (See Comments)    Other reaction(s): wheezing/sob  . Mupirocin Other (See Comments)  . Naproxen Other (See Comments) and Rash  . Naproxen Sodium Rash    mild rash  . Other Rash  . Oxycodone Hcl Rash  . Sulfa Antibiotics Hives and Rash    I personally reviewed active problem list, medication list, allergies, family history, social  history with the patient/caregiver today.   ROS  Constitutional: Negative for fever, positive for weight change.  Respiratory: Negative for cough and shortness of breath.   Cardiovascular: Negative for chest pain or palpitations.  Gastrointestinal: Positive  for abdominal pain, no bowel changes.  Musculoskeletal: Negative for gait problem or joint swelling.  Skin: Negative for rash.  Neurological: Negative for dizziness , positive for  headache.  No other specific complaints in a complete review of systems (except as listed in HPI above).  Objective  Vitals:   10/10/19 1001  BP: 100/66  Pulse: 81  Resp: 16  Temp: (!) 97.3 F (36.3 C)  TempSrc: Temporal  SpO2: 98%  Weight: 179 lb 1.6 oz (81.2 kg)  Height: 5\' 3"  (1.6 m)    Body mass index is 31.73 kg/m.  Physical Exam  Constitutional: Patient appears well-developed and well-nourished. Obese  No distress.  HEENT: head atraumatic, normocephalic, pupils equal and reactive to light Cardiovascular: Normal rate, regular rhythm and normal heart sounds.  No murmur heard. No BLE edema. Pulmonary/Chest: Effort normal and breath sounds normal. No respiratory distress. Abdominal: Soft.  There is no tenderness. Muscular Skeletal: trigger point positive  Psychiatric: Patient has a normal mood and affect. behavior is normal. Judgment and thought content normal.  Recent Results (from the past 2160 hour(s))  CBC     Status: None   Collection Time: 09/24/19  3:31 PM  Result Value Ref Range   WBC 9.5 4.0 - 10.5 K/uL   RBC 4.96 3.87 - 5.11 MIL/uL   Hemoglobin 14.3 12.0 - 15.0 g/dL   HCT 57.842.9 46.936.0 - 62.946.0 %   MCV 86.5 80.0 - 100.0 fL   MCH 28.8 26.0 - 34.0 pg   MCHC 33.3 30.0 - 36.0 g/dL   RDW 52.813.0 41.311.5 - 24.415.5 %   Platelets 296 150 - 400 K/uL   nRBC 0.0 0.0 - 0.2 %    Comment: Performed at [facility], [address] [place], [place] [id number]  Basic metabolic panel     Status: Abnormal   Collection Time: 09/24/19  3:31  PM  Result Value Ref Range  Sodium 138 135 - 145 mmol/L   Potassium 4.5 3.5 - 5.1 mmol/L   Chloride 99 98 - 111 mmol/L   CO2 28 22 - 32 mmol/L   Glucose, Bld 95 70 - 99 mg/dL   BUN 16 6 - 20 mg/dL   Creatinine, Ser 0.78 0.44 - 1.00 mg/dL   Calcium 11.3 (H) 8.9 - 10.3 mg/dL   GFR calc non Af Amer >60 >60 mL/min   GFR calc Af Amer >60 >60 mL/min   Anion gap 11 5 - 15    Comment: Performed at [facility], [address] [place], [place] [address]  PTH, intact and calcium     Status: Abnormal   Collection Time: 10/01/19 12:00 AM  Result Value Ref Range   PTH 13 (L) 14 - 64 pg/mL    Comment: . Interpretive Guide    Intact PTH           Calcium ------------------    ----------           ------- Normal Parathyroid    Normal               Normal Hypoparathyroidism    Low or Low Normal    Low Hyperparathyroidism    Primary            Normal or High       High    Secondary          High                 Normal or Low    Tertiary           High                 High Non-Parathyroid    Hypercalcemia      Low or Low Normal    High .    Calcium 9.9 8.6 - 10.2 mg/dL  CULTURE, URINE COMPREHENSIVE     Status: None   Collection Time: 10/01/19 12:00 AM   Specimen: Urine  Result Value Ref Range   MICRO NUMBER: [id number]    SPECIMEN QUALITY: Adequate    Source OTHER (SPECIFY)    STATUS: FINAL    RESULT: No Growth   POCT Occult Blood Stool     Status: Normal   Collection Time: 10/01/19 12:09 PM  Result Value Ref Range   Fecal Occult Blood, POC Negative Negative   Card #1 Date 10/01/2019    Card #2 Fecal Occult Blod, POC     Card #2 Date     Card #3 Fecal Occult Blood, POC     Card #3 Date       PHQ2/9: Depression screen [facility] 2/9 10/10/2019 10/01/2019 09/25/2018 06/22/2018 03/19/2018  Decreased Interest 1 1 2 1 1   Down, Depressed, Hopeless 1 1 1 1 1   PHQ - 2 Score 2 2 3 2 2   Altered sleeping 3 3 3 3 3   Tired, decreased energy 3 3 3 3 1   Change in appetite 1 1 2  0 0   Feeling bad or failure about yourself  1 1 1  0 0  Trouble concentrating 1 1 2 2 1   Moving slowly or fidgety/restless 1 2 3 2 1   Suicidal thoughts 0 0 0 0 0  PHQ-9 Score 12 13 17 12 8   Difficult doing work/chores Somewhat difficult Somewhat difficult Somewhat difficult Somewhat difficult Not difficult at all  Some recent data might be hidden    phq  9 is positive   Fall Risk: Fall Risk  10/10/2019 10/01/2019 09/25/2018 06/22/2018 03/19/2018  Falls in the past year? 1 0 No No No  Number falls in past yr: 1 0 - - -  Injury with Fall? 0 0 - - -  Comment - - - - -  Risk Factor Category  - - - - -  Follow up Falls prevention discussed - - - -    Assessment & Plan  1. Right hand paresthesia  - Ambulatory referral to Neurology  2. Moderate recurrent major depression (HCC)  Seeing psychiatrist   3. Lower abdominal pain  Improved   4. B12 deficiency  - Vitamin B12  5. Palpitation  Improved, seeing cardiologist   6. Fibromyalgia syndrome  Discussed weight loss, stretching   7. Dyslipidemia  - Lipid panel.

## 2019-10-11 LAB — LIPID PANEL
Cholesterol: 90 mg/dL (ref <200)
HDL: 33 mg/dL — ABNORMAL LOW (ref >=50)
LDL Cholesterol (Calc): 36 mg/dL (calc)
Non-HDL Cholesterol (Calc): 57 mg/dL (calc) (ref <130)
Total CHOL/HDL Ratio: 2.7 (calc) (ref <5.0)
Triglycerides: 131 mg/dL (ref <150)

## 2019-10-11 LAB — VITAMIN B12: Vitamin B-12: 450 pg/mL (ref 200–1100)

## 2019-10-18 ENCOUNTER — Other Ambulatory Visit: Payer: Self-pay

## 2019-10-18 ENCOUNTER — Ambulatory Visit (INDEPENDENT_AMBULATORY_CARE_PROVIDER_SITE_OTHER): Payer: Medicare Other | Admitting: Licensed Clinical Social Worker

## 2019-10-18 ENCOUNTER — Encounter: Payer: Self-pay | Admitting: Licensed Clinical Social Worker

## 2019-10-18 DIAGNOSIS — F331 Major depressive disorder, recurrent, moderate: Secondary | ICD-10-CM

## 2019-10-18 NOTE — Progress Notes (Signed)
Virtual Visit via Video Note  I connected with Delight Ovens on 10/18/19 at 11:00 AM EST by a video enabled telemedicine application and verified that I am speaking with the correct person using two identifiers.   I discussed the limitations of evaluation and management by telemedicine and the availability of in person appointments. The patient expressed understanding and agreed to proceed.  I discussed the assessment and treatment plan with the patient. The patient was provided an opportunity to ask questions and all were answered. The patient agreed with the plan and demonstrated an understanding of the instructions.   The patient was advised to call back or seek an in-person evaluation if the symptoms worsen or if the condition fails to improve as anticipated.  I provided 45 minutes of non-face-to-face time during this encounter.   Alden Hipp, LCSW    THERAPIST PROGRESS NOTE  Session Time: 1100 Participation Level: Active  Behavioral Response: NeatAlertNA  Type of Therapy: Individual Therapy  Treatment Goals addressed: Coping  Interventions: Supportive  Summary: ANKITA NEWCOMER is a 33 y.o. female who presents with continued symptoms related to her diagnosis. Bettyann reports doing well since our last session. She reports she has noticed herself becoming more easily agitated at small things, "my pen will stop working while [employer] and I will throw it across the room." LCSW validated this idea and asked Salley if she was still taking her psych medications. Nazariah reported they have stopped her psych medications until she gets the okay from her heart doctor. LCSW explained that could be the cause of her shortened temper. We had a discussion around medications and how it can impact our ability to manage irritation and stress when we stop taking it. Kimbly expressed understanding and agreement, and noted she was excited to start taking new medication. She reported she plans to call  her doctor ASAP. 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 LCSW celebrated this accomplishment and asked Chaney to discuss how it's been going. Ever reported feeling things have been going well and she is viewing this as an opportunity to improve her communication. LCSW validated this idea and highlighted how difficult it can be to find motivation to work on our mental health, but she was able to do it in a way that she enjoys which is truly remarkable. Latina was able to recognize this progress as well, though was hesitant at first to praise herself. Andrey reported the primary thing she wants to work on currently is improving her organization/procrastination. We discussed ways to stay on top of things, including the five minute rule (if it takes less than five minutes, go ahead and do it).   Suicidal/Homicidal: No  Therapist Response: Lolah continues to work towards her tx goals but has not yet reached them. We will continue to work on emotional regulation skills and improving distress tolerance moving forward.   Plan: Return again in 4 weeks.  Diagnosis: Axis I: MDD    Axis II: No diagnosis    Alden Hipp, LCSW 10/18/2019

## 2019-10-23 DIAGNOSIS — G43111 Migraine with aura, intractable, with status migrainosus: Secondary | ICD-10-CM | POA: Diagnosis not present

## 2019-10-23 DIAGNOSIS — M791 Myalgia, unspecified site: Secondary | ICD-10-CM | POA: Diagnosis not present

## 2019-10-23 DIAGNOSIS — G518 Other disorders of facial nerve: Secondary | ICD-10-CM | POA: Diagnosis not present

## 2019-10-23 DIAGNOSIS — G43109 Migraine with aura, not intractable, without status migrainosus: Secondary | ICD-10-CM | POA: Diagnosis not present

## 2019-10-23 DIAGNOSIS — M542 Cervicalgia: Secondary | ICD-10-CM | POA: Diagnosis not present

## 2019-10-23 DIAGNOSIS — G43719 Chronic migraine without aura, intractable, without status migrainosus: Secondary | ICD-10-CM | POA: Diagnosis not present

## 2019-10-23 NOTE — Progress Notes (Signed)
Cardiology Office Note    Date:  10/29/2019   ID:  Yesenia ShoeLaurie J Laitinen, DOB 09/19/1986, MRN [id number]  PCP:  Alba CorySowles, Krichna, MD  Cardiologist:  Lorine BearsMuhammad Arida, MD  Electrophysiologist:  None   Chief Complaint: Follow up  History of Present Illness:   Yesenia Wood is a 33 y.o. 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  Prior echo in 09/2014 was normal.  She underwent treadmill stress testing in 05/2015 that was normal.  However, her heart rate and BP were noted to be elevated at peak exercise.  In this setting, she was started on extended release Cardizem.  She wore a 48-hour Holter monitor 12/2017 given palpitations which showed sinus tachycardia.  Despite treatment with diltiazem she continued to have tachycardic episodes.  Given her asthma, treatment with beta-blocker was initially deferred, but ultimately she was started on small dose Toprol 25 mg daily.  Following that, she reported significant improvement in symptoms, though still had some residual palpitations, when she was seen virtually on 04/25/2019.  In this setting, Toprol was increased to 50 mg daily.  She was advised to continue long-acting diltiazem 120 mg daily.  Patient was evaluated on 09/23/2019 noting some improvement in palpitations with titration of Toprol.  She also noted progressive worsening of exertional chest discomfort and shortness of breath.  Her Toprol was titrated to 75 mg daily.  She underwent coronary CTA on 10/02/2019 which showed a calcium score of 0 with no evidence of CAD.  She comes in doing reasonably well from a cardiac perspective.  She has not had any further chest pain.  She does feel like there may have been a slight worsening and  shortness of breath following escalation of Toprol-XL to 75 mg daily.  She does note continued improvement in her palpitations with escalation of beta-blocker.  No lower extremity swelling, abdominal distention, orthopnea, or early satiety.  She continues to struggle with frequent headaches and is [place] by neurology in [place].  She wonders if her diet is contributing to these.  She would like to start an exercise regimen.   Labs: 09/2019  -total cholesterol 90, HDL 33, triglyceride 131, LDL 136 08/2019 - potassium 4.5, BUN 16, serum creatinine 0.78, Hgb 14.3, PLT 296 08/2018 - albumin 4.8, AST 32, ALT 36  Past Medical History:  Diagnosis Date  . ADHD (attention deficit hyperactivity disorder)   . B12 deficiency   . Bell's palsy    right sided  . Chronic migraine   . Deaf   . Depression   . 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 Fatigue   . Fatty liver   . Fibromyalgia   . GERD (gastroesophageal reflux disease)   . Headache   . High triglycerides   . Hyperglycemia   . Hypertension   . Hyperthyroidism   . Moderate asthma   . Obesity   . Paresthesia   . Premature birth     Past Surgical History:  Procedure Laterality Date  . BARTHOLIN GLAND CYST REMOVAL    . COCHLEAR IMPLANT    . COCHLEAR IMPLANT REMOVAL    . LAPAROSCOPIC TUBAL LIGATION Bilateral 03/26/2018   Procedure: LAPAROSCOPIC TUBAL LIGATION;  Surgeon: Hildred Laserherry, Anika, MD;  Location: [facility] ORS;  Service: Gynecology;  Laterality:  Bilateral;  with peritoneal biopsies  . LAPAROSCOPY    . MYRINGOTOMY WITH TUBE PLACEMENT    . TUBAL LIGATION  03/26/2018    Current Medications: Current Meds  Medication Sig  . Acetaminophen-Caff-Pyrilamine (MIDOL COMPLETE PO) Take by mouth.  Marland Kitchen BOTOX 100 units SOLR injection every 3 (three) months.   . diphenhydrAMINE (BENADRYL) 50 MG tablet Take 50 mg by mouth at bedtime as needed for itching.  Marland Kitchen FOLIC ACID PO Take by mouth.  .  metoprolol succinate (TOPROL-XL) 25 MG 24 hr tablet Take 3 tablets (75 mg total) by mouth daily.  . norethindrone (AYGESTIN) 5 MG tablet TAKE 1 TABLET BY MOUTH EVERY DAY (Patient taking differently: No sig reported)  . rOPINIRole (REQUIP) 1 MG tablet TAKE 1 TABLET (1 MG TOTAL) BY MOUTH AT BEDTIME.  . rosuvastatin (CRESTOR) 20 MG tablet TAKE 1 TABLET BY MOUTH EVERY DAY    Allergies:   Budesonide-formoterol fumarate, Astelin [azelastine], Gabapentin, Nortriptyline, Milnacipran, Tegaderm ag mesh [silver], Diclofenac, Duloxetine hcl, Montelukast sodium, Mupirocin, Naproxen, Naproxen sodium, Other, Oxycodone hcl, and Sulfa antibiotics   Social History   Socioeconomic History  . Marital status: Single    Spouse name: Not on file  . Number of children: 0  . Years of education: Not on file  . Highest education level: Some college, no degree  Occupational History  . Occupation: disability for hearing loss  Social Needs  . Financial resource strain: Somewhat hard  . Food insecurity    Worry: Never true    Inability: Never true  . Transportation needs    Medical: No    Non-medical: No  Tobacco Use  . Smoking status: Former Smoker    Packs/day: 1.00    Years: 2.00    Pack years: 2.00    Types: Cigarettes    Start date: 11/28/2004    Quit date: 11/28/2006    Years since quitting: 12.9  . Smokeless tobacco: Never Used  Substance and Sexual Activity  . Alcohol use: Not Currently    Alcohol/week: 0.0 standard drinks    Frequency: Never    Comment: occass  . Drug use: No  . Sexual activity: Not Currently    Birth control/protection: Pill    Comment: Seasonale  Lifestyle  . Physical activity    Days per week: 7 days    Minutes per session: 20 min  . Stress: To some extent  Relationships  . Social Musician on phone: Once a week    Gets together: Never    Attends religious service: Never    Active member of club or organization: No    Attends meetings of clubs or  organizations: Never    Relationship status: Never married  Other Topics Concern  . Not on file  Social History Narrative   Lives with her adopted  parents   Hearing loss since birth      Family History:  The patient's She was adopted. Family history is unknown by patient.  ROS:   Review of Systems  Constitutional: Positive for malaise/fatigue. Negative for chills, diaphoresis, fever and weight loss.  HENT: Negative for congestion.   Eyes: Negative for discharge and redness.  Respiratory: Positive for shortness of breath. Negative for cough, hemoptysis, sputum production and wheezing.   Cardiovascular: Positive for palpitations. Negative for chest pain, orthopnea, claudication, leg swelling and PND.       Improved palpitations  Gastrointestinal: Negative for abdominal pain, blood in stool, heartburn, melena, nausea and vomiting.  Genitourinary: Negative for hematuria.  Musculoskeletal: Negative for falls and myalgias.  Skin: Negative for rash.  Neurological: Positive for headaches. Negative for dizziness, tingling, tremors, sensory change, speech change, focal weakness, loss of consciousness and weakness.  Endo/Heme/Allergies: Does not bruise/bleed easily.  Psychiatric/Behavioral: Negative for substance abuse. The patient is not nervous/anxious.      EKGs/Labs/Other Studies Reviewed:    Studies reviewed were summarized above. The additional studies were reviewed today: As above.   EKG:  EKG is ordered today.  The EKG ordered today demonstrates NSR, 72 bpm, no acute ST-T changes  Recent Labs: 09/24/2019: BUN 16; Creatinine, Ser 0.78; Hemoglobin 14.3; Platelets 296; Potassium 4.5; Sodium 138  Recent Lipid Panel    Component Value Date/Time   CHOL 90 10/10/2019 0000   CHOL 192 10/29/2015 1050   TRIG 131 10/10/2019 0000   HDL 33 (L) 10/10/2019 0000   HDL 43 10/29/2015 1050   CHOLHDL 2.7 10/10/2019 0000   VLDL 20 10/10/2016 1235   LDLCALC 36 10/10/2019 0000    PHYSICAL  EXAM:    VS:  BP 110/70 (BP Location: Left Arm, Patient Position: Sitting, Cuff Size: Normal)   Pulse 72   Ht 5\' 3"  (1.6 m)   Wt 174 lb 8 oz (79.2 kg)   SpO2 98%   BMI 30.91 kg/m   BMI: Body mass index is 30.91 kg/m.  Physical Exam  Constitutional: She is oriented to person, place, and time. She appears well-developed and well-nourished.  HENT:  Head: Normocephalic and atraumatic.  Eyes: Right eye exhibits no discharge. Left eye exhibits no discharge.  Neck: Normal range of motion. No JVD present.  Cardiovascular: Normal rate, regular rhythm, S1 normal, S2 normal and normal heart sounds. Exam reveals no distant heart sounds, no friction rub, no midsystolic click and no opening snap.  No murmur heard. Pulses:      Posterior tibial pulses are 2+ on the right side and 2+ on the left side.  Pulmonary/Chest: Effort normal and breath sounds normal. No respiratory distress. She has no decreased breath sounds. She has no wheezes. She has no rales. She exhibits no tenderness.  Abdominal: Soft. She exhibits no distension. There is no abdominal tenderness.  Musculoskeletal:        General: No edema.  Neurological: She is alert and oriented to person, place, and time.  Skin: Skin is warm and dry. No cyanosis. Nails show no clubbing.  Psychiatric: She has a normal mood and affect. Her speech is normal and behavior is normal. Judgment and thought content normal.    Wt Readings from Last 3 Encounters:  10/29/19 174 lb 8 oz (79.2 kg)  10/10/19 179 lb 1.6 oz (81.2 kg)  10/10/19 179 lb 1.6 oz (81.2 kg)     ASSESSMENT & PLAN:   1. Inappropriate sinus tachycardia/palpitations: Symptoms continue to improve with escalation of beta-blocker.  Continue Toprol-XL 75 mg daily.  2. HTN: Blood pressure well controlled today.  Continue current therapy with metoprolol 75 mg daily.  3. HLD: Most recent LDL of 69.  Remains on Crestor.  4. Chest pain/dyspnea: Coronary CTA with a calcium score of 0.  Prior  echo unrevealing.  No further chest pain.  She does note a slight worsening in shortness of breath following further escalation of beta-blocker.  Cannot exclude component of her underlying asthma.  Check echo.  If this is unrevealing and symptoms persist despite optimization of her asthma by pulmonology could consider CPX.  Cannot exclude some component of deconditioning.  5. Language barrier: For office visit today interpretation was applied by ASL interpreter.  Disposition: F/u with Dr. Kirke Corin or an APP in 2 to 3 months.   Medication Adjustments/Labs and Tests Ordered: Current medicines are reviewed at length with the patient today.  Concerns regarding medicines are outlined above. Medication changes, Labs and Tests ordered today are summarized above and listed in the Patient Instructions accessible in Encounters.   Signed, Eula Listen, PA-C 10/29/2019 9:08 AM     [facility] [place] [address] [place], [place] [address] [phone]

## 2019-10-25 ENCOUNTER — Other Ambulatory Visit: Payer: Self-pay | Admitting: Psychiatry

## 2019-10-25 DIAGNOSIS — G2581 Restless legs syndrome: Secondary | ICD-10-CM

## 2019-10-25 DIAGNOSIS — F5105 Insomnia due to other mental disorder: Secondary | ICD-10-CM

## 2019-10-29 ENCOUNTER — Other Ambulatory Visit: Payer: Self-pay

## 2019-10-29 ENCOUNTER — Ambulatory Visit (INDEPENDENT_AMBULATORY_CARE_PROVIDER_SITE_OTHER): Payer: Medicare Other | Admitting: Psychiatry

## 2019-10-29 ENCOUNTER — Encounter: Payer: Self-pay | Admitting: Physician Assistant

## 2019-10-29 ENCOUNTER — Encounter: Payer: Self-pay | Admitting: Psychiatry

## 2019-10-29 ENCOUNTER — Ambulatory Visit (INDEPENDENT_AMBULATORY_CARE_PROVIDER_SITE_OTHER): Payer: Medicare Other | Admitting: Physician Assistant

## 2019-10-29 ENCOUNTER — Other Ambulatory Visit: Payer: Self-pay | Admitting: Psychiatry

## 2019-10-29 ENCOUNTER — Other Ambulatory Visit: Payer: Self-pay | Admitting: Physician Assistant

## 2019-10-29 VITALS — BP 110/70 | HR 72 | Ht 63.0 in | Wt 174.5 lb

## 2019-10-29 DIAGNOSIS — R0600 Dyspnea, unspecified: Secondary | ICD-10-CM

## 2019-10-29 DIAGNOSIS — I208 Other forms of angina pectoris: Secondary | ICD-10-CM | POA: Diagnosis not present

## 2019-10-29 DIAGNOSIS — G2581 Restless legs syndrome: Secondary | ICD-10-CM

## 2019-10-29 DIAGNOSIS — F331 Major depressive disorder, recurrent, moderate: Secondary | ICD-10-CM

## 2019-10-29 DIAGNOSIS — F5105 Insomnia due to other mental disorder: Secondary | ICD-10-CM

## 2019-10-29 DIAGNOSIS — R002 Palpitations: Secondary | ICD-10-CM | POA: Diagnosis not present

## 2019-10-29 DIAGNOSIS — I10 Essential (primary) hypertension: Secondary | ICD-10-CM

## 2019-10-29 DIAGNOSIS — E782 Mixed hyperlipidemia: Secondary | ICD-10-CM

## 2019-10-29 DIAGNOSIS — F4010 Social phobia, unspecified: Secondary | ICD-10-CM

## 2019-10-29 DIAGNOSIS — Z789 Other specified health status: Secondary | ICD-10-CM | POA: Diagnosis not present

## 2019-10-29 DIAGNOSIS — R0609 Other forms of dyspnea: Secondary | ICD-10-CM

## 2019-10-29 DIAGNOSIS — R000 Tachycardia, unspecified: Secondary | ICD-10-CM | POA: Diagnosis not present

## 2019-10-29 MED ORDER — VILAZODONE HCL 10 MG PO TABS: 10.0000 mg | ORAL_TABLET | Freq: Every day | ORAL | 1 refills | Status: DC

## 2019-10-29 NOTE — Patient Instructions (Signed)
Medication Instructions:   1. Your physician recommends that you continue on your current medications as directed. Please refer to the Current Medication list given to you today.  *If you need a refill on your cardiac medications before your next appointment, please call your pharmacy*  Lab Work:  1. None Ordered  If you have labs (blood work) drawn today and your tests are completely normal, you will receive your results only by: Marland Kitchen MyChart Message (if you have MyChart) OR . A paper copy in the mail If you have any lab test that is abnormal or we need to change your treatment, we will call you to review the results.  Testing/Procedures:  1. Echocardiogram   Please return to [facility] on ______________ at _______________ AM/PM for an Echocardiogram. Your physician has requested that you have an echocardiogram. Echocardiography is a painless test that uses sound waves to create images of your heart. It provides your doctor with information about the size and shape of your heart and how well your heart's chambers and valves are working. This procedure takes approximately one hour. There are no restrictions for this procedure. Please note; depending on visual quality an IV may need to be placed.    Follow-Up: At [facility], you and your health needs are our priority.  As part of our continuing mission to provide you with exceptional heart care, we have created designated Provider Care Teams.  These Care Teams include your primary Cardiologist (physician) and Advanced Practice Providers (APPs -  Physician Assistants and Nurse Practitioners) who all work together to provide you with the care you need, when you need it.  Your next appointment:   2-3  month(s)  The format for your next appointment:   In Person  Provider:   Kathlyn Sacramento, MD

## 2019-10-29 NOTE — Progress Notes (Signed)
Virtual Visit via Telephone Note  I connected with Yesenia Wood on 10/29/19 at  2:00 PM EST by telephone and verified that I am speaking with the correct person using two identifiers.   I discussed the limitations, risks, security and privacy concerns of performing an evaluation and management service by telephone and the availability of in person appointments. I also discussed with the patient that there may be a patient responsible charge related to this service. The patient expressed understanding and agreed to proceed.      I discussed the assessment and treatment plan with the patient. The patient was provided an opportunity to ask questions and all were answered. The patient agreed with the plan and demonstrated an understanding of the instructions.   The patient was advised to call back or seek an in-person evaluation if the symptoms worsen or if the condition fails to improve as anticipated.   BH MD OP Progress Note  10/29/2019 2:28 PM Yesenia ShoeLaurie J Decuir  MRN:  [id number]  Chief Complaint:  Chief Complaint    Follow-up     HPI: Yesenia Wood is a 33 year old Caucasian female, unemployed, single, lives in [place], has a history of depression, anxiety, insomnia, migraine headaches, vitamin B12 deficiency, bilateral hearing loss, tachycardia, is mute was evaluated by phone today.  Telephone interpreter was made use of during the evaluation.  Patient today reports that she had her cardiology consult this morning.  She was told everything is okay  and to continue Toprol-XL.  I have reviewed medical records in [facility] per Eula Listenyan Dunn -dated 10/29/2019-' patient presented for follow-up of chest discomfort.  Patient with prior echo on 10/17/2014 which was normal.  She underwent treadmill testing on 06/17/2015 that was normal.  However her heart rate and BP were noted to be elevated at peak exercise.  In this setting she was started on extended release Cardizem.  She wore a 48-hour Holter monitor on  01/17/2018 given palpitation which showed sinus tachycardia.  She continued to have tachycardic episodes despite being on diltiazem.  Given her asthma, treatment with beta-blocker was initially deferred but ultimately she was started on small dose of Toprol 25 mg p.o. daily.  The Toprol was titrated up to 75 mg.  She underwent coronary CTA on 10/02/2019 which showed a calcium score of 0 with no evidence of coronary artery disease."  Patient today reports she continues to struggle with lack of motivation, fatigue, tiredness.  She also struggles with sleep at night.  Patient reports she takes Requip however she does not think it is helpful at this dosage.  Patient denies any suicidality or homicidality or perceptual disturbances.  Patient reports appetite is fair.  Discussed starting Viibryd, she agrees with plan.  She is completely off of the venlafaxine.  Patient reports she is not interested in increasing her Requip dosage at this visit.  She wants to wait.          visit Diagnosis:    ICD-10-CM   1. MDD (major depressive disorder), recurrent episode, moderate (HCC)  F33.1 Vilazodone HCl (VIIBRYD) 10 MG TABS  2. Social anxiety disorder  F40.10   3. Insomnia due to mental condition  F51.05   4. RLS (restless legs syndrome)  G25.81     Past Psychiatric History: I have reviewed past psychiatric history from my progress note on 09/10/2018.  Past trials of Zoloft, Cymbalta, Pamelor, gabapentin, Abilify, Belsomra, hydroxyzine, trazodone, Remeron, Sonata, Wellbutrin.  Past Medical History:  Past Medical History:  Diagnosis Date  .  ADHD (attention deficit hyperactivity disorder)   . B12 deficiency   . Bell's palsy    right sided  . Chronic migraine   . Deaf   . Depression   . 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 Fatigue   . Fatty liver   . Fibromyalgia   . GERD (gastroesophageal reflux disease)   . Headache   . High  triglycerides   . Hyperglycemia   . Hypertension   . Hyperthyroidism   . Moderate asthma   . Obesity   . Paresthesia   . Premature birth     Past Surgical History:  Procedure Laterality Date  . BARTHOLIN GLAND CYST REMOVAL    . COCHLEAR IMPLANT    . COCHLEAR IMPLANT REMOVAL    . LAPAROSCOPIC TUBAL LIGATION Bilateral 03/26/2018   Procedure: LAPAROSCOPIC TUBAL LIGATION;  Surgeon: Hildred Laser, MD;  Location: [facility] ORS;  Service: Gynecology;  Laterality: Bilateral;  with peritoneal biopsies  . LAPAROSCOPY    . MYRINGOTOMY WITH TUBE PLACEMENT    . TUBAL LIGATION  03/26/2018    Family Psychiatric History: Reviewed family psychiatric history from my progress note on 09/10/2018  Family History:  Family History  Adopted: Yes  Family history unknown: Yes    Social History: Reviewed social history from my progress note on 09/10/2018. Social History   Socioeconomic History  . Marital status: Single    Spouse name: Not on file  . Number of children: 0  . Years of education: Not on file  . Highest education level: Some college, no degree  Occupational History  . Occupation: disability for hearing loss  Social Needs  . Financial resource strain: Somewhat hard  . Food insecurity    Worry: Never true    Inability: Never true  . Transportation needs    Medical: No    Non-medical: No  Tobacco Use  . Smoking status: Former Smoker    Packs/day: 1.00    Years: 2.00    Pack years: 2.00    Types: Cigarettes    Start date: 11/28/2004    Quit date: 11/28/2006    Years since quitting: 12.9  . Smokeless tobacco: Never Used  Substance and Sexual Activity  . Alcohol use: Not Currently    Alcohol/week: 0.0 standard drinks    Frequency: Never    Comment: occass  . Drug use: No  . Sexual activity: Not Currently    Birth control/protection: Pill    Comment: Seasonale  Lifestyle  . Physical activity    Days per week: 7 days    Minutes per session: 20 min  . Stress: To some extent   Relationships  . Social Musician on phone: Once a week    Gets together: Never    Attends religious service: Never    Active member of club or organization: No    Attends meetings of clubs or organizations: Never    Relationship status: Never married  Other Topics Concern  . Not on file  Social History Narrative   Lives with her adopted  parents   Hearing loss since birth     Allergies:  Allergies  Allergen Reactions  . Budesonide-Formoterol Fumarate Anaphylaxis and Other (See Comments)    Other reaction(s): Other (See Comments) chest pain Chest pain chest pain  . Astelin [Azelastine]     Tingling, numbness, nausea  . Gabapentin Nausea And Vomiting  . Nortriptyline Other (See Comments)  Other reaction(s): Other (See Comments)  . Milnacipran Other (See Comments)    Pressure in eyes  . Tegaderm Ag Mesh [Silver] Itching  . Diclofenac Other (See Comments)    Other reaction(s): Other (See Comments)  . Duloxetine Hcl Other (See Comments)    Other reaction(s): Other (See Comments) Other reaction(s): unknown  . Montelukast Sodium Other (See Comments)    Other reaction(s): wheezing/sob  . Mupirocin Other (See Comments)  . Naproxen Other (See Comments) and Rash  . Naproxen Sodium Rash    mild rash  . Other Rash  . Oxycodone Hcl Rash  . Sulfa Antibiotics Hives and Rash    Metabolic Disorder Labs: Lab Results  Component Value Date   HGBA1C 4.9 01/29/2018   MPG 94 01/29/2018   MPG 97 10/10/2016   No results found for: PROLACTIN Lab Results  Component Value Date   CHOL 90 10/10/2019   TRIG 131 10/10/2019   HDL 33 (L) 10/10/2019   CHOLHDL 2.7 10/10/2019   VLDL 20 10/10/2016   LDLCALC 36 10/10/2019   LDLCALC 69 09/26/2018   Lab Results  Component Value Date   TSH 2.84 01/29/2018   TSH 1.649 03/02/2016    Therapeutic Level Labs: No results found for: LITHIUM No results found for: VALPROATE No components found for:  CBMZ  Current  Medications: Current Outpatient Medications  Medication Sig Dispense Refill  . Acetaminophen-Caff-Pyrilamine (MIDOL COMPLETE PO) Take by mouth.    Marland Kitchen BOTOX 100 units SOLR injection every 3 (three) months.     . diphenhydrAMINE (BENADRYL) 50 MG tablet Take 50 mg by mouth at bedtime as needed for itching.    Marland Kitchen FOLIC ACID PO Take by mouth.    . metoprolol succinate (TOPROL-XL) 25 MG 24 hr tablet TAKE 3 TABLETS BY MOUTH DAILY 270 tablet 0  . norethindrone (AYGESTIN) 5 MG tablet TAKE 1 TABLET BY MOUTH EVERY DAY (Patient taking differently: No sig reported) 90 tablet 1  . rOPINIRole (REQUIP) 1 MG tablet TAKE 1 TABLET (1 MG TOTAL) BY MOUTH AT BEDTIME. 30 tablet 1  . rosuvastatin (CRESTOR) 20 MG tablet TAKE 1 TABLET BY MOUTH EVERY DAY 90 tablet 0  . Vilazodone HCl (VIIBRYD) 10 MG TABS Take 1 tablet (10 mg total) by mouth daily. 30 tablet 1   No current facility-administered medications for this visit.      Musculoskeletal: Strength & Muscle Tone: UTA Gait & Station: Reports as WNL Patient leans: N/A  Psychiatric Specialty Exam: Review of Systems  Constitutional: Positive for malaise/fatigue.  Psychiatric/Behavioral: Positive for depression. The patient has insomnia.   All other systems reviewed and are negative.   There were no vitals taken for this visit.There is no height or weight on file to calculate BMI.  General Appearance: UTA  Eye Contact:  UTA  Speech:  Is mute   Volume: mute  Mood:  Depressed  Affect:  UTA  Thought Process:  Goal Directed and Descriptions of Associations: Intact  Orientation:  Full (Time, Place, and Person)  Thought Content: Logical   Suicidal Thoughts:  No  Homicidal Thoughts:  No  Memory:  Immediate;   Fair Recent;   Fair Remote;   Fair  Judgement:  Fair  Insight:  Fair  Psychomotor Activity:  UTA  Concentration:  Concentration: Fair and Attention Span: Fair  Recall:  [employer] of Knowledge: Fair  Language: Fair  Akathisia:  No  Handed:  Right   AIMS (if indicated): denies tremors, rigidity  Assets:  Communication Skills  Desire for Improvement Social Support  ADL's:  Intact  Cognition: WNL  Sleep:  Restless   Screenings: GAD-7     [facility] Visit from 09/25/2018 in [facility] Office Visit from 06/22/2018 in [facility] Office Visit from 03/19/2018 in [facility] Office Visit from 01/29/2018 in [facility]  Total GAD-7 Score  14  7  12  7     PHQ2-9     Clinical Support from 10/10/2019 in [facility] Office Visit from 10/01/2019 in [facility] Office Visit from 09/25/2018 in [facility] Office Visit from 06/22/2018 in [facility] Office Visit from [facility]/19/2018 in [facility]  PHQ-2 Total Score  2  2  3  2  2   PHQ-9 Total Score  12  13  17  12  8        Assessment and Plan: Jackelin is a 33 year old Caucasian female who is single, unemployed, lives in [place], has a history of depression, anxiety, multiple medical problems, is deaf and mute was evaluated by phone today.  Patient continues to struggle with depressive symptoms.  Patient had cardiology visit this morning and was advised to continue Toprol.  Plan as noted below.  Plan MDD-unstable Start Viibryd 10 mg p.o. daily Continue l-methylfolate 7.5 mg p.o. daily for folic acid conversion reduction.  Social anxiety disorder-some progress Continue CBT with [name]  Sleep problems-possible restless leg syndrome-unstable Requip 1 mg p.o. nightly-patient is not interested in dosage increase today. She had sleep study done a year ago which showed restless leg symptoms.  [employer] testing results were reviewed while making medication changes.  I have reviewed medical records in [facility] dated 10/29/2019-PA Ryann Dunn as noted above.  Follow-up in clinic in 1 month or sooner if needed.  January 4 at 2  PM  I have spent atleast 25 minutes non face to face with patient today. More than 50 % of the time was spent for psychoeducation and supportive psychotherapy and care coordination. This note was generated in part or whole with voice recognition software. Voice recognition is usually quite accurate but there are transcription errors that can and very often do occur. I apologize for any typographical errors that were not detected and corrected.        [name], MD 10/29/2019, 2:27 PM

## 2019-11-11 ENCOUNTER — Ambulatory Visit (INDEPENDENT_AMBULATORY_CARE_PROVIDER_SITE_OTHER): Payer: Medicare Other | Admitting: Licensed Clinical Social Worker

## 2019-11-11 ENCOUNTER — Other Ambulatory Visit: Payer: Self-pay

## 2019-11-11 ENCOUNTER — Encounter: Payer: Self-pay | Admitting: Licensed Clinical Social Worker

## 2019-11-11 DIAGNOSIS — F4010 Social phobia, unspecified: Secondary | ICD-10-CM

## 2019-11-11 DIAGNOSIS — F331 Major depressive disorder, recurrent, moderate: Secondary | ICD-10-CM | POA: Diagnosis not present

## 2019-11-11 NOTE — Progress Notes (Signed)
Virtual Visit via Telephone Note  I connected with Yesenia Wood on 11/11/19 at  1:30 PM EST by telephone and verified that I am speaking with the correct person using two identifiers.   I discussed the limitations, risks, security and privacy concerns of performing an evaluation and management service by telephone and the availability of in person appointments. I also discussed with the patient that there may be a patient responsible charge related to this service. The patient expressed understanding and agreed to proceed.    I discussed the assessment and treatment plan with the patient. The patient was provided an opportunity to ask questions and all were answered. The patient agreed with the plan and demonstrated an understanding of the instructions.   The patient was advised to call back or seek an in-person evaluation if the symptoms worsen or if the condition fails to improve as anticipated.  I provided 45 minutes of non-face-to-face time during this encounter.   Alden Hipp, LCSW    THERAPIST PROGRESS NOTE  Session Time: 1330  Participation Level: Active  Behavioral Response: NeatAlertAnxious  Type of Therapy: Individual Therapy  Treatment Goals addressed: Anxiety  Interventions: Supportive  Summary: Yesenia Wood is a 33 y.o. female who presents with continued symptoms related to her diagnosis. Yesenia Wood reports doing well since our last session. She reports she was started on a new medication, which she reports has been helpful so far. She stated she has only been on it for a short amount of time, but so far her agitation has decreased and she has noticed positive changes. LCSW validated this information and Yesenia Wood's feelings around it. She noted, "but, this is the last medication we can try. We've tried basically everything else." LCSW encouraged Yesenia Wood to keep an open mind, as even though they have tried a lot of medications, there could be more that come out--or others  that the MD hasn't tried. Yesenia Wood expressed understanding and agreement. Yesenia Wood reported she asked her family to download an app that would allow them to video chat on a platform with an interpreter but none of them have downloaded it yet. LCSW encouraged Yesenia Wood to invite them to download it again, and perhaps include a line about why it is important to her. Yesenia Wood expressed agreement with this idea and noted she could do that. Yesenia Wood reported feeling frustrated with the deaf community groups she has attempted to attend, "they just don't have the same grammar at signing as I do and sometimes I can't understand what they're saying." LCSW validated her frustration and encouraged her to continue looking for another group that might suit her needs more effectively. Yesenia Wood expressed understanding and agreement with this information as well.   Suicidal/Homicidal: No  Therapist Response: Yesenia Wood continues to work towards her tx goals but has not yet reached them. We will continue to work on improving emotional regulation and CBT skills moving forward.   Plan: Return again in 4 weeks.  Diagnosis: Axis I:  MDD    Axis II: No diagnosis    Alden Hipp, LCSW 11/11/2019

## 2019-12-02 ENCOUNTER — Other Ambulatory Visit: Payer: Self-pay

## 2019-12-02 ENCOUNTER — Ambulatory Visit (INDEPENDENT_AMBULATORY_CARE_PROVIDER_SITE_OTHER): Payer: Medicare Other | Admitting: Psychiatry

## 2019-12-02 ENCOUNTER — Other Ambulatory Visit: Payer: Self-pay | Admitting: Psychiatry

## 2019-12-02 ENCOUNTER — Encounter: Payer: Self-pay | Admitting: Psychiatry

## 2019-12-02 DIAGNOSIS — F331 Major depressive disorder, recurrent, moderate: Secondary | ICD-10-CM | POA: Diagnosis not present

## 2019-12-02 DIAGNOSIS — F5105 Insomnia due to other mental disorder: Secondary | ICD-10-CM

## 2019-12-02 DIAGNOSIS — F4010 Social phobia, unspecified: Secondary | ICD-10-CM

## 2019-12-02 MED ORDER — VILAZODONE HCL 10 MG PO TABS: 15.0000 mg | ORAL_TABLET | Freq: Every day | ORAL | 1 refills | Status: DC

## 2019-12-02 NOTE — Progress Notes (Addendum)
Virtual Visit via Telephone Note  I connected with Yesenia Wood on 12/02/19 at  2:00 PM EST by telephone and verified that I am speaking with the correct person using two identifiers.   I discussed the limitations, risks, security and privacy concerns of performing an evaluation and management service by telephone and the availability of in person appointments. I also discussed with the patient that there may be a patient responsible charge related to this service. The patient expressed understanding and agreed to proceed.      I discussed the assessment and treatment plan with the patient. The patient was provided an opportunity to ask questions and all were answered. The patient agreed with the plan and demonstrated an understanding of the instructions.   The patient was advised to call back or seek an in-person evaluation if the symptoms worsen or if the condition fails to improve as anticipated.   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  A video interpreter was made use of during the evaluation.  Patient today reports she is currently making some progress on the [place].  She has noticed improvement in her energy level as well as depression.  She reports she had GI side effects initially however they are getting better.  Patient however reports that she does have a history of migraine headaches and her headaches may be getting worse on the current medication.  She however has upcoming appointment with her headache specialist.  Patient also reports of increased appetite on the current medication.   Patient reports sleep continues to be restless  however she is able to fall back asleep even though her sleep is interrupted.  She continues to take Requip at bedtime for her restless leg syndrome.  Patient denies any suicidality, homicidality or perceptual disturbances.  Patient continues to work with her therapist Ms. Alden Hipp. Visit Diagnosis:    ICD-10-CM   1. MDD (major depressive disorder), recurrent episode, moderate (HCC)  F33.1 DISCONTINUED: Vilazodone HCl (VIIBRYD) 10 MG TABS  2. Social anxiety disorder  F40.10   3. Insomnia due to mental condition  F51.05     Past Psychiatric History: I have reviewed past psychiatric history from my progress note on 09/10/2018.  Past trials of Zoloft, Cymbalta, Pamelor, gabapentin, Abilify, Belsomra, hydroxyzine, trazodone, Remeron, Sonata, Wellbutrin.  Past Medical History:  Past Medical History:  Diagnosis Date  . ADHD (attention deficit hyperactivity disorder)   . B12 deficiency   . Bell's palsy    right sided  . Chronic migraine   . Deaf   . Depression   . 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 Fatigue   . Fatty liver   . Fibromyalgia   . GERD (gastroesophageal reflux disease)   . Headache   . High triglycerides   . Hyperglycemia   . Hypertension   . Hyperthyroidism   . Moderate asthma   . Obesity   . Paresthesia   . Premature birth     Past Surgical History:  Procedure Laterality Date  . BARTHOLIN GLAND CYST REMOVAL    . COCHLEAR IMPLANT    . COCHLEAR IMPLANT  REMOVAL    . LAPAROSCOPIC TUBAL LIGATION Bilateral 03/26/2018   Procedure: LAPAROSCOPIC TUBAL LIGATION;  Surgeon: Hildred Laser, MD;  Location: [facility] ORS;  Service: Gynecology;  Laterality: Bilateral;  with peritoneal biopsies  . LAPAROSCOPY    . MYRINGOTOMY WITH TUBE PLACEMENT    . TUBAL LIGATION  03/26/2018    Family Psychiatric History: I have reviewed family psychiatric history from my progress note on 09/10/2018.  Family History:  Family History   Adopted: Yes  Family history unknown: Yes    Social History: I have reviewed social history from my progress note on 09/10/2018. Social History   Socioeconomic History  . Marital status: Single    Spouse name: Not on file  . Number of children: 0  . Years of education: Not on file  . Highest education level: Some college, no degree  Occupational History  . Occupation: disability for hearing loss  Tobacco Use  . Smoking status: Former Smoker    Packs/day: 1.00    Years: 2.00    Pack years: 2.00    Types: Cigarettes    Start date: 11/28/2004    Quit date: 11/28/2006    Years since quitting: 13.0  . Smokeless tobacco: Never Used  Substance and Sexual Activity  . Alcohol use: Not Currently    Alcohol/week: 0.0 standard drinks    Comment: occass  . Drug use: No  . Sexual activity: Not Currently    Birth control/protection: Pill    Comment: Seasonale  Other Topics Concern  . Not on file  Social History Narrative   Lives with her adopted  parents   Hearing loss since birth    Social Determinants of Health   Financial Resource Strain:   . Difficulty of Paying Living Expenses: Not on file  Food Insecurity:   . Worried About Programme researcher, broadcasting/film/video in the Last Year: Not on file  . Ran Out of Food in the Last Year: Not on file  Transportation Needs:   . Lack of Transportation (Medical): Not on file  . Lack of Transportation (Non-Medical): Not on file  Physical Activity: Unknown  . Days of Exercise per Week: Not on file  . Minutes of Exercise per Session: 20 min  Stress:   . Feeling of Stress : Not on file  Social Connections:   . Frequency of Communication with Friends and Family: Not on file  . Frequency of Social Gatherings with Friends and Family: Not on file  . Attends Religious Services: Not on file  . Active Member of Clubs or Organizations: Not on file  . Attends Banker Meetings: Not on file  . Marital Status: Not on file    Allergies:  Allergies   Allergen Reactions  . Budesonide-Formoterol Fumarate Anaphylaxis and Other (See Comments)    Other reaction(s): Other (See Comments) chest pain Chest pain chest pain  . Astelin [Azelastine]     Tingling, numbness, nausea  . Gabapentin Nausea And Vomiting  . Nortriptyline Other (See Comments)    Other reaction(s): Other (See Comments)  . Milnacipran Other (See Comments)    Pressure in eyes  . Tegaderm Ag Mesh [Silver] Itching  . Diclofenac Other (See Comments)    Other reaction(s): Other (See Comments)  . Duloxetine Hcl Other (See Comments)    Other reaction(s): Other (See Comments) Other reaction(s): unknown  . Montelukast Sodium Other (See Comments)    Other reaction(s): wheezing/sob  . Mupirocin Other (See Comments)  . Naproxen Other (See Comments) and  Rash  . Naproxen Sodium Rash    mild rash  . Other Rash  . Oxycodone Hcl Rash  . Sulfa Antibiotics Hives and Rash    Metabolic Disorder Labs: Lab Results  Component Value Date   HGBA1C 4.9 01/29/2018   MPG 94 01/29/2018   MPG 97 10/10/2016   No results found for: PROLACTIN Lab Results  Component Value Date   CHOL 90 10/10/2019   TRIG 131 10/10/2019   HDL 33 (L) 10/10/2019   CHOLHDL 2.7 10/10/2019   VLDL 20 10/10/2016   LDLCALC 36 10/10/2019   LDLCALC 69 09/26/2018   Lab Results  Component Value Date   TSH 2.84 01/29/2018   TSH 1.649 03/02/2016    Therapeutic Level Labs: No results found for: LITHIUM No results found for: VALPROATE No components found for:  CBMZ  Current Medications: Current Outpatient Medications  Medication Sig Dispense Refill  . Acetaminophen-Caff-Pyrilamine (MIDOL COMPLETE PO) Take by mouth.    Marland Kitchen BOTOX 100 units SOLR injection every 3 (three) months.     . diltiazem (TIAZAC) 120 MG 24 hr capsule Take by mouth.    . diphenhydrAMINE (BENADRYL) 50 MG tablet Take 50 mg by mouth at bedtime as needed for itching.    Marland Kitchen FOLIC ACID PO Take by mouth.    . metoprolol succinate (TOPROL-XL)  25 MG 24 hr tablet TAKE 3 TABLETS BY MOUTH DAILY 270 tablet 0  . norethindrone (AYGESTIN) 5 MG tablet TAKE 1 TABLET BY MOUTH EVERY DAY (Patient taking differently: No sig reported) 90 tablet 1  . rOPINIRole (REQUIP) 1 MG tablet TAKE 1 TABLET (1 MG TOTAL) BY MOUTH AT BEDTIME. 90 tablet 1  . rosuvastatin (CRESTOR) 20 MG tablet TAKE 1 TABLET BY MOUTH EVERY DAY 90 tablet 0  . Vilazodone HCl (VIIBRYD) 10 MG TABS Take 1.5 tablets (15 mg total) by mouth daily. 45 tablet 1   No current facility-administered medications for this visit.     Musculoskeletal: Strength & Muscle Tone: UTA Gait & Station: Reports as WNL Patient leans: N/A  Psychiatric Specialty Exam: Review of Systems  Neurological: Positive for headaches.  Psychiatric/Behavioral: Positive for dysphoric mood.  All other systems reviewed and are negative.   There were no vitals taken for this visit.There is no height or weight on file to calculate BMI.  General Appearance: UTA  Eye Contact:  UTA  Speech:  pt is mute  Volume:  pt is mute  Mood:  Depressed improving  Affect:  UTA  Thought Process:  Goal Directed and Descriptions of Associations: Intact  Orientation:  Full (Time, Place, and Person)  Thought Content: Logical   Suicidal Thoughts:  No  Homicidal Thoughts:  No  Memory:  Immediate;   Fair Recent;   Fair Remote;   Fair  Judgement:  Fair  Insight:  Fair  Psychomotor Activity:  UTA  Concentration:  Concentration: Fair and Attention Span: Fair  Recall:  [employer] of Knowledge: Fair  Language: Fair  Akathisia:  No  Handed:  Right  AIMS (if indicated): uta  Assets:  Communication Skills Desire for Improvement Housing Social Support  ADL's:  Intact  Cognition: WNL  Sleep:  Fair   Screenings: GAD-7     Office Visit from 09/25/2018 in [facility] Office Visit from 06/22/2018 in [facility] Office Visit from 03/19/2018 in [facility] Office Visit from  01/29/2018 in [facility]  Total GAD-7 Score  14  7  12  7    PHQ2-9     Clinical Support from 10/10/2019 in [facility] Office Visit from 10/01/2019 in [facility] Office Visit from 09/25/2018 in [facility] Office Visit from 06/22/2018 in [facility] Office Visit from 03/19/2018 in [facility]  PHQ-2 Total Score  2  2  3  2  2   PHQ-9 Total Score  12  13  17  12  8        Assessment and Plan: Yesenia Wood is a 34 year old Caucasian female who is single, unemployed, lives in [place], has a history of depression, anxiety, multiple medical problems and is deaf and mute, was evaluated by phone today.  Patient continues to struggle with depressive symptoms although she reports she is making progress on the Viibryd.  Patient will continue to benefit from psychotherapy sessions.  Plan as noted below.  Plan MDD-improving Increase Viibryd to 15 mg p.o. daily Continue l-methylfolate 7.5 mg p.o. daily for folic acid conversion reduction  Social anxiety disorder-improving Continue CBT with Ms. 32  Sleep problems-possible restless leg syndrome-improving Requip 1 mg p.o. nightly. Patient reports having sleep study done a year ago which showed restless leg symptoms.  GeneSight testing results were made use of point making medication changes.  Video enabled interpreter was made use of while evaluating patient while patient spoke to [place] by phone.  Follow-up in clinic in 3 to 4 weeks or sooner if needed.  February 2 at 1:30 PM  I have spent atleast 20 MINUTES non face to face with patient today. More than 50 % of the time was spent for psychoeducation and supportive psychotherapy and care coordination. This note was generated in part or whole with voice recognition software. Voice recognition is usually quite accurate but there are transcription errors that can and very often do  occur. I apologize for any typographical errors that were not detected and corrected.       Clinical research associate, MD 12/02/2019, 5:23 PM

## 2019-12-02 NOTE — Telephone Encounter (Signed)
Sent viibryd 15 mg ton pharmacy

## 2019-12-03 ENCOUNTER — Telehealth: Payer: Self-pay

## 2019-12-03 DIAGNOSIS — F331 Major depressive disorder, recurrent, moderate: Secondary | ICD-10-CM

## 2019-12-03 DIAGNOSIS — G43111 Migraine with aura, intractable, with status migrainosus: Secondary | ICD-10-CM | POA: Diagnosis not present

## 2019-12-03 DIAGNOSIS — G518 Other disorders of facial nerve: Secondary | ICD-10-CM | POA: Diagnosis not present

## 2019-12-03 DIAGNOSIS — G43109 Migraine with aura, not intractable, without status migrainosus: Secondary | ICD-10-CM | POA: Diagnosis not present

## 2019-12-03 DIAGNOSIS — M791 Myalgia, unspecified site: Secondary | ICD-10-CM | POA: Diagnosis not present

## 2019-12-03 DIAGNOSIS — G43719 Chronic migraine without aura, intractable, without status migrainosus: Secondary | ICD-10-CM | POA: Diagnosis not present

## 2019-12-03 DIAGNOSIS — M542 Cervicalgia: Secondary | ICD-10-CM | POA: Diagnosis not present

## 2019-12-03 MED ORDER — VILAZODONE HCL 10 MG PO TABS: 10.0000 mg | ORAL_TABLET | Freq: Every day | ORAL | 1 refills | Status: DC

## 2019-12-03 NOTE — Telephone Encounter (Signed)
pt wanted to stay at 10mg  because she was told that it could cause headaches. so she wanted to staty at 10mg  and not 15mg . also pt stated she needed namebrand.

## 2019-12-03 NOTE — Telephone Encounter (Signed)
I have sent Viibryd 10 mg - brand name to pharmacy.

## 2019-12-04 ENCOUNTER — Ambulatory Visit (INDEPENDENT_AMBULATORY_CARE_PROVIDER_SITE_OTHER): Payer: Medicare Other

## 2019-12-04 ENCOUNTER — Other Ambulatory Visit: Payer: Self-pay

## 2019-12-04 DIAGNOSIS — R0600 Dyspnea, unspecified: Secondary | ICD-10-CM | POA: Diagnosis not present

## 2019-12-04 DIAGNOSIS — R0609 Other forms of dyspnea: Secondary | ICD-10-CM

## 2019-12-09 ENCOUNTER — Ambulatory Visit: Payer: Medicare Other | Admitting: Licensed Clinical Social Worker

## 2019-12-09 ENCOUNTER — Other Ambulatory Visit: Payer: Self-pay

## 2019-12-17 ENCOUNTER — Other Ambulatory Visit: Payer: Self-pay | Admitting: Family Medicine
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
## 2019-12-24 DIAGNOSIS — R531 Weakness: Secondary | ICD-10-CM | POA: Diagnosis not present

## 2019-12-24 DIAGNOSIS — R202 Paresthesia of skin: Secondary | ICD-10-CM | POA: Diagnosis not present

## 2019-12-24 DIAGNOSIS — R200 Anesthesia of skin: Secondary | ICD-10-CM | POA: Diagnosis not present

## 2019-12-24 DIAGNOSIS — M797 Fibromyalgia: Secondary | ICD-10-CM | POA: Diagnosis not present

## 2019-12-24 DIAGNOSIS — R519 Headache, unspecified: Secondary | ICD-10-CM | POA: Diagnosis not present

## 2019-12-24 DIAGNOSIS — M542 Cervicalgia: Secondary | ICD-10-CM | POA: Diagnosis not present

## 2019-12-26 DIAGNOSIS — R200 Anesthesia of skin: Secondary | ICD-10-CM | POA: Insufficient documentation

## 2019-12-26 DIAGNOSIS — R202 Paresthesia of skin: Secondary | ICD-10-CM | POA: Insufficient documentation

## 2019-12-31 ENCOUNTER — Ambulatory Visit (INDEPENDENT_AMBULATORY_CARE_PROVIDER_SITE_OTHER): Payer: Medicare Other | Admitting: Psychiatry

## 2019-12-31 ENCOUNTER — Other Ambulatory Visit: Payer: Self-pay

## 2019-12-31 ENCOUNTER — Encounter: Payer: Self-pay | Admitting: Psychiatry

## 2019-12-31 DIAGNOSIS — F4010 Social phobia, unspecified: Secondary | ICD-10-CM | POA: Diagnosis not present

## 2019-12-31 DIAGNOSIS — F331 Major depressive disorder, recurrent, moderate: Secondary | ICD-10-CM

## 2019-12-31 DIAGNOSIS — Z9114 Patient's other noncompliance with medication regimen: Secondary | ICD-10-CM | POA: Diagnosis not present

## 2019-12-31 DIAGNOSIS — F5105 Insomnia due to other mental disorder: Secondary | ICD-10-CM

## 2019-12-31 DIAGNOSIS — Z91148 Patient's other noncompliance with medication regimen for other reason: Secondary | ICD-10-CM | POA: Insufficient documentation

## 2019-12-31 NOTE — Progress Notes (Signed)
Provider Location : [facility] Patient Location : Home   Virtual Visit via Telephone Note  I connected with Yesenia Wood on 12/31/19 at  1:30 PM EST by telephone and verified that I am speaking with the correct person using two identifiers.   I discussed the limitations, risks, security and privacy concerns of performing an evaluation and management service by telephone and the availability of in person appointments. I also discussed with the patient that there may be a patient responsible charge related to this service. The patient expressed understanding and agreed to proceed.      I discussed the assessment and treatment plan with the patient. The patient was provided an opportunity to ask questions and all were answered. The patient agreed with the plan and demonstrated an understanding of the instructions.   The patient was advised to call back or seek an in-person evaluation if the symptoms worsen or if the condition fails to improve as anticipated.   BH MD OP Progress Note  12/31/2019 1:59 PM Yesenia Wood  MRN:  [id number]  Chief Complaint:  Chief Complaint    Follow-up     HPI: Yesenia Wood is a 34 year old Caucasian female, unemployed, single, lives in [place], has a history of depression, social anxiety, insomnia, migraine headaches, vitamin B12 deficiency, bilateral hearing loss, tachycardia, is mute was evaluated by video enabled interpreter.  Patient today reports she continues to struggle with headaches.  She reports her headache providers believe the worsening headache could be due to her new medications of Viibryd.  She reports the Viibryd does help her with her motivation however she does not want to live with this headache which is constant and happens every day.  She reports sleep is better since the past few weeks.  She reports she was able to get a weighted blanket and and that has helped her to sleep better.  She currently sleeps around 9 hours or more every night.  This  is an improvement.  She stopped taking the Requip since she felt it was not that helpful.  She reports she had some tingling and numbness of her extremities recently and she was started on clonazepam by neurology.  She has not started taking it yet.  Patient denies any suicidality, homicidality.  Patient does report random auditory hallucinations-reports she is able to cope with it.  Patient reports the voices tell her different things.  Since she is able to cope with it she is not interested in medications for the same at this time.  Patient continues to work with her therapist and is agreeable to seeing her therapist more frequently.  Patient denies any other concerns today.   Visit Diagnosis:    ICD-10-CM   1. MDD (major depressive disorder), recurrent episode, moderate (HCC)  F33.1   2. Social anxiety disorder  F40.10   3. Insomnia due to mental condition  F51.05   4. Medication nonadherence due to intolerance  Z91.14     Past Psychiatric History: I have reviewed past psychiatric history from my progress note on 09/10/2018.  Past trials of Zoloft, Cymbalta, Pamelor, gabapentin, Abilify, Belsomra, hydroxyzine, trazodone, Remeron, Sonata, Wellbutrin, Requip, Viibryd  Past Medical History:  Past Medical History:  Diagnosis Date  . ADHD (attention deficit hyperactivity disorder)   . B12 deficiency   . Bell's palsy    right sided  . Chronic migraine   . Deaf   . Depression   . 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 Fatigue   . Fatty liver   . Fibromyalgia   . GERD (gastroesophageal reflux disease)   . Headache   . High triglycerides   . Hyperglycemia   . Hypertension   . Hyperthyroidism   . Moderate asthma   . Obesity   . Paresthesia   . Premature birth     Past Surgical History:  Procedure Laterality Date  . BARTHOLIN GLAND CYST REMOVAL    . COCHLEAR IMPLANT    . COCHLEAR IMPLANT REMOVAL    . LAPAROSCOPIC TUBAL  LIGATION Bilateral 03/26/2018   Procedure: LAPAROSCOPIC TUBAL LIGATION;  Surgeon: Rubie Maid, MD;  Location: [facility] ORS;  Service: Gynecology;  Laterality: Bilateral;  with peritoneal biopsies  . LAPAROSCOPY    . MYRINGOTOMY WITH TUBE PLACEMENT    . TUBAL LIGATION  03/26/2018    Family Psychiatric History: I have reviewed family psychiatric history from my progress note on 09/10/2018.  Family History:  Family History  Adopted: Yes  Family history unknown: Yes    Social History: Reviewed social history from my progress note on 09/10/2018. Social History   Socioeconomic History  . Marital status: Single    Spouse name: Not on file  . Number of children: 0  . Years of education: Not on file  . Highest education level: Some college, no degree  Occupational History  . Occupation: disability for hearing loss  Tobacco Use  . Smoking status: Former Smoker    Packs/day: 1.00    Years: 2.00    Pack years: 2.00    Types: Cigarettes    Start date: 11/28/2004    Quit date: 11/28/2006    Years since quitting: 13.0  . Smokeless tobacco: Never Used  Substance and Sexual Activity  . Alcohol use: Not Currently    Alcohol/week: 0.0 standard drinks    Comment: occass  . Drug use: No  . Sexual activity: Not Currently    Birth control/protection: Pill    Comment: Seasonale  Other Topics Concern  . Not on file  Social History Narrative   Lives with her adopted  parents   Hearing loss since birth    Social Determinants of Health   Financial Resource Strain:   . Difficulty of Paying Living Expenses: Not on file  Food Insecurity:   . Worried About Charity fundraiser in the Last Year: Not on file  . Ran Out of Food in the Last Year: Not on file  Transportation Needs:   . Lack of Transportation (Medical): Not on file  . Lack of Transportation (Non-Medical): Not on file  Physical Activity: Unknown  . Days of Exercise per Week: Not on file  . Minutes of Exercise per Session: 20 min   Stress:   . Feeling of Stress : Not on file  Social Connections:   . Frequency of Communication with Friends and Family: Not on file  . Frequency of Social Gatherings with Friends and Family: Not on file  . Attends Religious Services: Not on file  . Active Member of Clubs or Organizations: Not on file  . Attends Archivist Meetings: Not on file  . Marital Status: Not on file    Allergies:  Allergies  Allergen Reactions  . Budesonide-Formoterol Fumarate Anaphylaxis and Other (See Comments)    Other reaction(s): Other (See Comments) chest pain Chest pain chest pain  . Astelin [Azelastine]     Tingling, numbness, nausea  . Gabapentin Nausea And Vomiting  . Nortriptyline Other (  See Comments)    Other reaction(s): Other (See Comments)  . Milnacipran Other (See Comments)    Pressure in eyes  . Tegaderm Ag Mesh [Silver] Itching  . Diclofenac Other (See Comments)    Other reaction(s): Other (See Comments)  . Duloxetine Hcl Other (See Comments)    Other reaction(s): Other (See Comments) Other reaction(s): unknown  . Montelukast Sodium Other (See Comments)    Other reaction(s): wheezing/sob  . Mupirocin Other (See Comments)  . Naproxen Other (See Comments) and Rash    Other reaction(s): rash/itching  . Naproxen Sodium Rash    mild rash  . Other Rash  . Oxycodone Hcl Rash  . Sulfa Antibiotics Hives, Rash and Itching    Metabolic Disorder Labs: Lab Results  Component Value Date   HGBA1C 4.9 01/29/2018   MPG 94 01/29/2018   MPG 97 10/10/2016   No results found for: PROLACTIN Lab Results  Component Value Date   CHOL 90 10/10/2019   TRIG 131 10/10/2019   HDL 33 (L) 10/10/2019   CHOLHDL 2.7 10/10/2019   VLDL 20 10/10/2016   LDLCALC 36 10/10/2019   LDLCALC 69 09/26/2018   Lab Results  Component Value Date   TSH 2.84 01/29/2018   TSH 1.649 03/02/2016    Therapeutic Level Labs: No results found for: LITHIUM No results found for: VALPROATE No components  found for:  CBMZ  Current Medications: Current Outpatient Medications  Medication Sig Dispense Refill  . clonazePAM (KLONOPIN) 0.5 MG tablet Take 1/2 tab 1-2 times a day    . Acetaminophen-Caff-Pyrilamine (MIDOL COMPLETE PO) Take by mouth.    Marland Kitchen BOTOX 100 units SOLR injection every 3 (three) months.     . clonazePAM (KLONOPIN) 0.5 MG tablet Take 0.5 mg by mouth daily as needed.    . diltiazem (TIAZAC) 120 MG 24 hr capsule Take by mouth.    . diphenhydrAMINE (BENADRYL) 50 MG tablet Take 50 mg by mouth at bedtime as needed for itching.    Marland Kitchen FOLIC ACID PO Take by mouth.    . metoprolol succinate (TOPROL-XL) 25 MG 24 hr tablet TAKE 3 TABLETS BY MOUTH DAILY 270 tablet 0  . norethindrone (AYGESTIN) 5 MG tablet TAKE 1 TABLET BY MOUTH EVERY DAY (Patient taking differently: No sig reported) 90 tablet 1  . rosuvastatin (CRESTOR) 20 MG tablet TAKE 1 TABLET BY MOUTH EVERY DAY 90 tablet 0   No current facility-administered medications for this visit.     Musculoskeletal: Strength & Muscle Tone: UTA Gait & Station: Reports as WNL Patient leans: N/A  Psychiatric Specialty Exam: Review of Systems  Neurological: Positive for headaches.  Psychiatric/Behavioral: Positive for dysphoric mood.  All other systems reviewed and are negative.   There were no vitals taken for this visit.There is no height or weight on file to calculate BMI.  General Appearance: UTA  Eye Contact:  UTA  Speech:  Clear and Coherent  Volume:  Normal  Mood:  Depressed  Affect:  UTA  Thought Process:  Goal Directed and Descriptions of Associations: Intact  Orientation:  Full (Time, Place, and Person)  Thought Content: Hallucinations: Auditory on and off , random , reports she is able to cope - unknown if true psychosis   Suicidal Thoughts:  No  Homicidal Thoughts:  No  Memory:  Immediate;   Fair Recent;   Fair Remote;   Fair  Judgement:  Fair  Insight:  Fair  Psychomotor Activity:  UTA  Concentration:  Concentration:  Fair and Attention Span:  Fair  Recall:  Yesenia Wood of Knowledge: Fair  Language: Fair  Akathisia:  No  Handed:  Right  AIMS (if indicated): denies tremors, rigidity  Assets:  Communication Skills Desire for Improvement Housing Social Support  ADL's:  Intact  Cognition: WNL  Sleep:  Improved   Screenings: GAD-7     Office Visit from 09/25/2018 in [facility] Office Visit from 06/22/2018 in [facility] Office Visit from 03/19/2018 in [facility] Office Visit from 01/29/2018 in [facility]  Total GAD-7 Score  14  7  12  7     PHQ2-9     Clinical Support from 10/10/2019 in [facility] Office Visit from 10/01/2019 in [facility] Office Visit from 09/25/2018 in [facility] Office Visit from 06/22/2018 in [facility] Office Visit from 03/19/2018 in [facility]  PHQ-2 Total Score  2  2  3  2  2   PHQ-9 Total Score  12  13  17  12  8        Assessment and Plan: Priscille is a 34 year old Caucasian female who is single, unemployed, lives in [place], has a history of depression, anxiety, multiple medical problems, is deaf and mute was evaluated by phone with assistance of video enabled interpreter.  Patient continues to struggle with depression however is not tolerating her medications and reports side effects to the Viibryd.  Discussed plan as noted below.  Plan MDD-unstable Patient is currently on Viibryd 10 mg and reports possible headaches. Discussed discontinuing Viibryd. Advised patient to continue to work with her therapist however to make more frequent appointments.  Social anxiety disorder-improving Continue CBT  Sleep problems/restless leg syndrome-stable Patient reports sleep is better. Discontinue Requip for noncompliance.  Patient reports she stopped taking it.  None adherence to medications due to  intolerance-patient was tried and failed multiple medications.  Gene[employer] testing results fair reviewed and made use of while making medication changes however in spite of that patient continues to have side effects.  It was discussed with patient to work with her therapist and pursue more frequent psychotherapy sessions and to return for a follow-up session as needed if she decides to get back on medications in the future.  Follow-up in clinic as needed.  I have spent atleast 20 minutes non face to face with patient today. More than 50 % of the time was spent for ordering medications and test ,psychoeducation and supportive psychotherapy and care coordination,as well as documenting clinical information in electronic health record.  This note was generated in part or whole with voice recognition software. Voice recognition is usually quite accurate but there are transcription errors that can and very often do occur. I apologize for any typographical errors that were not detected and corrected.       Jacki Cones, MD 12/31/2019, 1:59 PM

## 2020-01-08 ENCOUNTER — Ambulatory Visit: Payer: Medicare Other | Admitting: Licensed Clinical Social Worker

## 2020-01-14 ENCOUNTER — Ambulatory Visit (INDEPENDENT_AMBULATORY_CARE_PROVIDER_SITE_OTHER): Payer: Medicare Other | Admitting: Cardiovascular Disease

## 2020-01-14 ENCOUNTER — Encounter: Payer: Self-pay | Admitting: Cardiovascular Disease

## 2020-01-14 ENCOUNTER — Other Ambulatory Visit: Payer: Self-pay

## 2020-01-14 VITALS — BP 108/72 | HR 78 | Ht 63.0 in | Wt 181.0 lb

## 2020-01-14 DIAGNOSIS — R002 Palpitations: Secondary | ICD-10-CM

## 2020-01-14 DIAGNOSIS — R0609 Other forms of dyspnea: Secondary | ICD-10-CM

## 2020-01-14 DIAGNOSIS — R000 Tachycardia, unspecified: Secondary | ICD-10-CM

## 2020-01-14 DIAGNOSIS — E785 Hyperlipidemia, unspecified: Secondary | ICD-10-CM | POA: Diagnosis not present

## 2020-01-14 DIAGNOSIS — R0600 Dyspnea, unspecified: Secondary | ICD-10-CM | POA: Diagnosis not present

## 2020-01-14 NOTE — Progress Notes (Signed)
Cardiology Office Note   Date:  01/14/2020   ID:  Yesenia Wood, DOB 1986/09/08, MRN [id number]  PCP:  Steele Sizer, MD  Cardiologist:   Kathlyn Sacramento, MD   Chief Complaint  Patient presents with  . other    3 month f/u echo discuss results. Meds reviewed verbally with pt.      History of Present Illness: Yesenia Wood is a 34 y.o. female who presents for a follow-up visit regarding inappropriate sinus tachycardia.  This visit was facilitated with an interpreter given that the patient is deaf.  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  Prior echo in [place] 2015 was completely normal. She underwent treadmill stress testing 06/23/2015 that was normal. However, her HR and BP were elevated at peak exercise. 48-hour Holter monitor showed sinus tachycardia. She was initially treated with diltiazem but did not respond very well and ultimately switched to Toprol. She had exertional chest pain last year.  She underwent coronary CTA in November which showed a calcium score of 0 with no evidence of coronary artery disease. Echocardiogram last month showed normal LV systolic function with no significant valvular abnormalities. Diltiazem was discontinued last year as it was felt it was not effective.  She is currently taking Toprol 75 mg once daily and reports overall improvement in symptoms.  She continues to have some mild exertional shortness of breath and chest pain.   Past Medical History:  Diagnosis Date  . ADHD (attention deficit hyperactivity disorder)   . B12 deficiency   . Bell's palsy    right sided  . Chronic migraine   . Deaf   . Depression   . Dissocial personality disorder ([place])    three different personalities documented by PCP  . Fatigue   . Fatty liver   . Fibromyalgia   . GERD (gastroesophageal reflux disease)   . Headache   . High  triglycerides   . Hyperglycemia   . Hypertension   . Hyperthyroidism   . Moderate asthma   . Obesity   . Paresthesia   . Premature birth     Past Surgical History:  Procedure Laterality Date  . BARTHOLIN GLAND CYST REMOVAL    . COCHLEAR IMPLANT    . COCHLEAR IMPLANT REMOVAL    . LAPAROSCOPIC TUBAL LIGATION Bilateral 03/26/2018   Procedure: LAPAROSCOPIC TUBAL LIGATION;  Surgeon: Rubie Maid, MD;  Location: [facility] ORS;  Service: Gynecology;  Laterality: Bilateral;  with peritoneal biopsies  . LAPAROSCOPY    . MYRINGOTOMY WITH TUBE PLACEMENT    . TUBAL LIGATION  03/26/2018     Current Outpatient Medications  Medication Sig Dispense Refill  . Acetaminophen-Caff-Pyrilamine (MIDOL COMPLETE PO) Take by mouth.    Marland Kitchen BOTOX 100 units SOLR injection every 3 (three) months.     . clonazePAM (KLONOPIN) 0.5 MG tablet Take 1/2 tab 1-2 times a day    . diphenhydrAMINE (BENADRYL) 50 MG tablet Take 50 mg by mouth at bedtime as needed for itching.    Marland Kitchen FOLIC ACID PO Take by mouth.    . metoprolol succinate (TOPROL-XL) 25 MG 24 hr tablet TAKE 3 TABLETS BY MOUTH DAILY 270 tablet 0  . norethindrone (AYGESTIN) 5 MG tablet Take 2.5 mg by mouth daily.    . rosuvastatin (CRESTOR) 20 MG tablet TAKE 1 TABLET BY MOUTH EVERY DAY 90 tablet 0   No current facility-administered medications for  this visit.    Allergies:   Budesonide-formoterol fumarate, Astelin [azelastine], Gabapentin, Nortriptyline, Milnacipran, Tegaderm ag mesh [silver], Diclofenac, Duloxetine hcl, Montelukast sodium, Mupirocin, Naproxen, Naproxen sodium, Other, Oxycodone hcl, and Sulfa antibiotics    Social History:  The patient  reports that she quit smoking about 13 years ago. Her smoking use included cigarettes. She started smoking about 15 years ago. She has a 2.00 pack-year smoking history. She has never used smokeless tobacco. She reports previous alcohol use. She reports that she does not use drugs.   Family History:  The patient's  She was adopted. Family history is unknown by patient.    ROS:  Please see the history of present illness.   Otherwise, review of systems are positive for none.   All other systems are reviewed and negative.    PHYSICAL EXAM: VS:  BP 108/72 (BP Location: Left Arm, Patient Position: Sitting, Cuff Size: Large)   Pulse 78   Ht 5\' 3"  (1.6 m)   Wt 181 lb (82.1 kg)   SpO2 98%   BMI 32.06 kg/m  , BMI Body mass index is 32.06 kg/m. GEN: Well nourished, well developed, in no acute distress  HEENT: normal  Neck: no JVD, carotid bruits, or masses Cardiac: RRR; no murmurs, rubs, or gallops,no edema  Respiratory:  clear to auscultation bilaterally, normal work of breathing GI: soft, nontender, nondistended, + BS MS: no deformity or atrophy  Skin: warm and dry, no rash Neuro:  Strength and sensation are intact Psych: euthymic mood, full affect   EKG:  EKG is ordered today. The ekg ordered today demonstrates normal sinus rhythm with no significant ST or T wave changes.   Recent Labs: 09/24/2019: BUN 16; Creatinine, Ser 0.78; Hemoglobin 14.3; Platelets 296; Potassium 4.5; Sodium 138    Lipid Panel    Component Value Date/Time   CHOL 90 10/10/2019 0000   CHOL 192 10/29/2015 1050   TRIG 131 10/10/2019 0000   HDL 33 (L) 10/10/2019 0000   HDL 43 10/29/2015 1050   CHOLHDL 2.7 10/10/2019 0000   VLDL 20 10/10/2016 1235   LDLCALC 36 10/10/2019 0000      Wt Readings from Last 3 Encounters:  01/14/20 181 lb (82.1 kg)  10/29/19 174 lb 8 oz (79.2 kg)  10/10/19 179 lb 1.6 oz (81.2 kg)       ASSESSMENT AND PLAN:  ASSESSMENT & PLAN:    1.  Inappropriate sinus tachycardia: Symptoms are reasonably controlled on current dose of Toprol 75 mg once daily.  2. Shortness of breath: I do not think this is cardiac in nature given normal echocardiogram and cardiac CTA.  I suspect there is an element of physical deconditioning and she also has underlying asthma.  3.  Hyperlipidemia: Lipid  profile 2019 showed an LDL of 195 but since then she has been started on rosuvastatin with significant improvement.  Lipid profile 3 months ago showed an LDL of 36.   Disposition:   FU with me in 6 months  Signed,   2020, MD  01/14/2020 3:03 PM    [facility]

## 2020-01-14 NOTE — Patient Instructions (Signed)

## 2020-01-23 DIAGNOSIS — G43719 Chronic migraine without aura, intractable, without status migrainosus: Secondary | ICD-10-CM | POA: Diagnosis not present

## 2020-01-23 DIAGNOSIS — G43111 Migraine with aura, intractable, with status migrainosus: Secondary | ICD-10-CM | POA: Diagnosis not present

## 2020-01-23 DIAGNOSIS — G518 Other disorders of facial nerve: Secondary | ICD-10-CM | POA: Diagnosis not present

## 2020-01-23 DIAGNOSIS — G43109 Migraine with aura, not intractable, without status migrainosus: Secondary | ICD-10-CM | POA: Diagnosis not present

## 2020-01-23 DIAGNOSIS — M791 Myalgia, unspecified site: Secondary | ICD-10-CM | POA: Diagnosis not present

## 2020-01-23 DIAGNOSIS — M542 Cervicalgia: Secondary | ICD-10-CM | POA: Diagnosis not present

## 2020-01-28 ENCOUNTER — Ambulatory Visit (INDEPENDENT_AMBULATORY_CARE_PROVIDER_SITE_OTHER): Payer: Medicare Other | Admitting: Family Medicine

## 2020-01-28 ENCOUNTER — Encounter: Payer: Self-pay | Admitting: Family Medicine

## 2020-01-28 ENCOUNTER — Other Ambulatory Visit: Payer: Self-pay

## 2020-01-28 VITALS — BP 110/74 | HR 96 | Temp 97.5°F | Resp 16 | Ht 63.0 in | Wt 180.5 lb

## 2020-01-28 DIAGNOSIS — M25572 Pain in left ankle and joints of left foot: Secondary | ICD-10-CM

## 2020-01-28 DIAGNOSIS — S96912A Strain of unspecified muscle and tendon at ankle and foot level, left foot, initial encounter: Secondary | ICD-10-CM | POA: Diagnosis not present

## 2020-01-28 MED ORDER — DICLOFENAC SODIUM 1 % EX GEL: 4.0000 g | Freq: Four times a day (QID) | CUTANEOUS | 0 refills | Status: DC

## 2020-01-28 MED ORDER — ACE ANKLE BRACE LEFT/RIGHT MISC: 1.0000 | Freq: Every day | 0 refills | Status: DC

## 2020-01-28 NOTE — Progress Notes (Signed)
Name: Yesenia Wood   MRN: [id number]    DOB: Jun 27, 1986   Date:01/28/2020       Progress Note  Subjective  Chief Complaint  Chief Complaint  Patient presents with  . Ankle Pain    Onset-2 months, left ankle, swelling. Tried heat, wrapping it and elevating her ankle. Hurts worst with movement. In November she slipped outside due to it being wet outside but the pain went away.    HPI  Left ankle pain: she states she fell around Thanksgiving and twisted the left ankle, it was associated with swelling but no bruising,  symptoms improved after a few days and she only took Midol at the time. She states  over the past couple of months she noticed the pain returned, it has been daily and getting progressively worse, initially only with activity but now all the time and worse with activity. It has been constant for the past few weeks, currently 4/10 , with activity it goes up to 6-7/10. The pain is described as sharp with activity otherwise it is aching . Pain is worse with plantar flexion and dorsiflexion, difficulty bearing weight on left side when going downstairs     Patient Active Problem List   Diagnosis Date Noted  . Medication nonadherence due to intolerance 12/31/2019  . Numbness 12/26/2019  . Tingling 12/26/2019  . RLS (restless legs syndrome) 09/30/2019  . MDD (major depressive disorder), recurrent episode, moderate (HCC) 07/17/2019  . Social anxiety disorder 07/17/2019  . 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 OSA (obstructive sleep apnea) 10/10/2016  . 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  . Dissocial personality disorder (HCC) 06/07/2016  . Sleep disturbance 05/10/2016  . Chronic migraine without aura 03/29/2016  . Costochondral chest pain 12/28/2015  . Pain in the chest 06/09/2015  . Palpitations 06/09/2015  . Deaf 05/16/2015  . B12 deficiency 05/16/2015  . Cochlear implant status 05/16/2015  . Fatty infiltration of liver 05/16/2015  . Neck pain 05/16/2015  . Gastro-esophageal  reflux disease without esophagitis 05/16/2015  . Generalized headache 05/16/2015  . Right-sided Bell's palsy 05/16/2015  . H/O: depression 05/16/2015  . Hypertriglyceridemia 05/16/2015  . Blood glucose elevated 05/16/2015  . Irregular menstrual cycle 05/16/2015  . Overweight 05/16/2015  . Paresthesia 05/16/2015  . Disorder of labyrinth 05/16/2015  . Dyspnea 10/07/2014  . Moderate persistent intrinsic asthma without status asthmaticus without complication 10/07/2014  . Migraine 08/05/2013  . Allergic rhinitis 07/01/2013  . 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 Obesity (BMI 30-39.9) 07/01/2013  . Unspecified asthma, uncomplicated 07/01/2013  . Bartholin gland cyst 09/16/2008    Past Surgical History:  Procedure Laterality Date  . BARTHOLIN GLAND CYST REMOVAL    . COCHLEAR IMPLANT    . COCHLEAR IMPLANT REMOVAL    . LAPAROSCOPIC TUBAL LIGATION Bilateral 03/26/2018   Procedure: LAPAROSCOPIC TUBAL LIGATION;  Surgeon: Hildred Laser, MD;  Location: [facility] ORS;  Service: Gynecology;  Laterality: Bilateral;  with peritoneal biopsies  . LAPAROSCOPY    . MYRINGOTOMY WITH TUBE PLACEMENT    . TUBAL LIGATION  03/26/2018    Family History  Adopted: Yes  Family history unknown: Yes    Social History   Tobacco Use  . Smoking status: Former Smoker    Packs/day: 1.00    Years: 2.00    Pack years: 2.00    Types: Cigarettes    Start date: 11/28/2004    Quit date: 11/28/2006    Years since quitting: 13.1  . Smokeless tobacco: Never Used  Substance Use Topics  .  Alcohol use: Not Currently    Alcohol/week: 0.0 standard drinks    Comment: occass     Current Outpatient Medications:  .  BOTOX 100 units SOLR injection, every 3 (three) months. , Disp: , Rfl:  .  clonazePAM (KLONOPIN) 0.5 MG tablet, Take 1/2 tab 1-2 times a day, Disp: , Rfl:  .  diphenhydrAMINE (BENADRYL) 50 MG tablet, Take 50 mg by mouth at bedtime as needed for itching., Disp: , Rfl:  .  FOLIC ACID PO, Take by mouth., Disp: ,  Rfl:  .  metoprolol succinate (TOPROL-XL) 25 MG 24 hr tablet, TAKE 3 TABLETS BY MOUTH DAILY, Disp: 270 tablet, Rfl: 0 .  norethindrone (AYGESTIN) 5 MG tablet, Take 2.5 mg by mouth daily., Disp: , Rfl:  .  rosuvastatin (CRESTOR) 20 MG tablet, TAKE 1 TABLET BY MOUTH EVERY DAY, Disp: 90 tablet, Rfl: 0 .  Acetaminophen-Caff-Pyrilamine (MIDOL COMPLETE PO), Take by mouth., Disp: , Rfl:   Allergies  Allergen Reactions  . Budesonide-Formoterol Fumarate Anaphylaxis and Other (See Comments)    Other reaction(s): Other (See Comments) chest pain Chest pain chest pain  . Astelin [Azelastine]     Tingling, numbness, nausea  . Gabapentin Nausea And Vomiting  . Nortriptyline Other (See Comments)    Other reaction(s): Other (See Comments)  . Milnacipran Other (See Comments)    Pressure in eyes  . Tegaderm Ag Mesh [Silver] Itching  . Diclofenac Other (See Comments)    Other reaction(s): Other (See Comments)  . Duloxetine Hcl Other (See Comments)    Other reaction(s): Other (See Comments) Other reaction(s): unknown  . Montelukast Sodium Other (See Comments)    Other reaction(s): wheezing/sob  . Mupirocin Other (See Comments)  . Naproxen Other (See Comments) and Rash    Other reaction(s): rash/itching  . Naproxen Sodium Rash    mild rash  . Other Rash  . Oxycodone Hcl Rash  . Sulfa Antibiotics Hives, Rash and Itching    I personally reviewed active problem list, medication list, allergies, family history, social history, health maintenance with the patient/caregiver today.   ROS  Ten systems reviewed and is negative except as mentioned in HPI   Objective  Vitals:   01/28/20 1352  BP: 110/74  Pulse: 96  Resp: 16  Temp: (!) 97.5 F (36.4 C)  TempSrc: Temporal  SpO2: 96%  Weight: 180 lb 8 oz (81.9 kg)  Height: 5\' 3"  (1.6 m)    Body mass index is 31.97 kg/m.  Physical Exam  Constitutional: Patient appears well-developed and well-nourished. Obese  No distress.  HEENT: head  atraumatic, normocephalic, pupils equal and reactive to light Cardiovascular: Normal rate, regular rhythm and normal heart sounds.  No murmur heard. No BLE edema. Pulmonary/Chest: Effort normal and breath sounds normal. No respiratory distress. Abdominal: Soft.  There is no tenderness. Psychiatric: Patient has a normal mood and affect. behavior is normal. Judgment and thought content normal. Muscular Skeletal: pain during palpation of lateral aspect of ankle and foot, no ecchymosis or swelling , some pain with dorsiflexion, and plantar flexion also with internal rotation   PHQ2/9: Depression screen [facility] 2/9 01/28/2020 10/10/2019 10/01/2019 09/25/2018 06/22/2018  Decreased Interest 1 1 1 2 1   Down, Depressed, Hopeless 1 1 1 1 1   PHQ - 2 Score 2 2 2 3 2   Altered sleeping 2 3 3 3 3   Tired, decreased energy 2 3 3 3 3   Change in appetite 1 1 1 2  0  Feeling bad or failure about yourself  0 1 1 1  0  Trouble concentrating 1 1 1 2 2   Moving slowly or fidgety/restless 1 1 2 3 2   Suicidal thoughts 0 0 0 0 0  PHQ-9 Score 9 12 13 17 12   Difficult doing work/chores Somewhat difficult Somewhat difficult Somewhat difficult Somewhat difficult Somewhat difficult  Some recent data might be hidden    phq 9 is positive   Fall Risk: Fall Risk  01/28/2020 10/10/2019 10/01/2019 09/25/2018 06/22/2018  Falls in the past year? 1 1 0 No No  Number falls in past yr: 1 1 0 - -  [place] - - - -  Injury with Fall? 1 0 0 - -  Comment ankle - - - -  Risk Factor Category  - - - - -  Follow up - Falls prevention discussed - - -    Functional Status Survey: Is the patient deaf or have difficulty hearing?: Yes Does the patient have difficulty seeing, even when wearing glasses/contacts?: No Does the patient have difficulty concentrating, remembering, or making decisions?: No Does the patient have difficulty walking or climbing stairs?: No Does the patient have difficulty dressing or bathing?: No Does the  patient have difficulty doing errands alone such as visiting a doctor's office or shopping?: No    Assessment & Plan   1. Ankle strain, left, initial encounter  - Elastic Bandages & Supports (ACE ANKLE BRACE LEFT/RIGHT) MISC; 1 each by Does not apply route daily.  Dispense: 1 each; Refill: 0 - diclofenac Sodium (VOLTAREN) 1 % GEL; Apply 4 g topically 4 (four) times daily.  Dispense: 100 g; Refill: 0  2. Left ankle pain, unspecified chronicity  - Elastic Bandages & Supports (ACE ANKLE BRACE LEFT/RIGHT) MISC; 1 each by Does not apply route daily.  Dispense: 1 each; Refill: 0 - diclofenac Sodium (VOLTAREN) 1 % GEL; Apply 4 g topically 4 (four) times daily.  Dispense: 100 g; Refill: 0  She will call me back if no improvement for referral to Podiatrist

## 2020-03-03 DIAGNOSIS — Z23 Encounter for immunization: Secondary | ICD-10-CM | POA: Diagnosis not present

## 2020-03-05 DIAGNOSIS — M791 Myalgia, unspecified site: Secondary | ICD-10-CM | POA: Diagnosis not present

## 2020-03-05 DIAGNOSIS — G43109 Migraine with aura, not intractable, without status migrainosus: Secondary | ICD-10-CM | POA: Diagnosis not present

## 2020-03-05 DIAGNOSIS — M542 Cervicalgia: Secondary | ICD-10-CM | POA: Diagnosis not present

## 2020-03-05 DIAGNOSIS — G43111 Migraine with aura, intractable, with status migrainosus: Secondary | ICD-10-CM | POA: Diagnosis not present

## 2020-03-05 DIAGNOSIS — G43719 Chronic migraine without aura, intractable, without status migrainosus: Secondary | ICD-10-CM | POA: Diagnosis not present

## 2020-03-05 DIAGNOSIS — G518 Other disorders of facial nerve: Secondary | ICD-10-CM | POA: Diagnosis not present

## 2020-03-09 ENCOUNTER — Other Ambulatory Visit: Payer: Self-pay | Admitting: Family Medicine

## 2020-03-09 ENCOUNTER — Other Ambulatory Visit: Payer: Self-pay | Admitting: Physician Assistant

## 2020-03-10 DIAGNOSIS — M542 Cervicalgia: Secondary | ICD-10-CM | POA: Diagnosis not present

## 2020-03-10 DIAGNOSIS — R519 Headache, unspecified: Secondary | ICD-10-CM | POA: Diagnosis not present

## 2020-03-10 DIAGNOSIS — R200 Anesthesia of skin: Secondary | ICD-10-CM | POA: Diagnosis not present

## 2020-03-10 DIAGNOSIS — M797 Fibromyalgia: Secondary | ICD-10-CM | POA: Diagnosis not present

## 2020-03-10 DIAGNOSIS — R531 Weakness: Secondary | ICD-10-CM | POA: Diagnosis not present

## 2020-03-10 DIAGNOSIS — R202 Paresthesia of skin: Secondary | ICD-10-CM | POA: Diagnosis not present

## 2020-03-13 ENCOUNTER — Other Ambulatory Visit: Payer: Self-pay | Admitting: Obstetrics and Gynecology

## 2020-03-24 DIAGNOSIS — Z23 Encounter for immunization: Secondary | ICD-10-CM | POA: Diagnosis not present

## 2020-04-08 ENCOUNTER — Ambulatory Visit (INDEPENDENT_AMBULATORY_CARE_PROVIDER_SITE_OTHER): Payer: Medicare Other | Admitting: Family Medicine

## 2020-04-08 ENCOUNTER — Other Ambulatory Visit: Payer: Self-pay

## 2020-04-08 ENCOUNTER — Encounter: Payer: Self-pay | Admitting: Family Medicine

## 2020-04-08 VITALS — BP 124/76 | HR 92 | Temp 97.3°F | Resp 16 | Ht 63.0 in | Wt 187.8 lb

## 2020-04-08 DIAGNOSIS — R002 Palpitations: Secondary | ICD-10-CM | POA: Diagnosis not present

## 2020-04-08 DIAGNOSIS — G43709 Chronic migraine without aura, not intractable, without status migrainosus: Secondary | ICD-10-CM | POA: Diagnosis not present

## 2020-04-08 DIAGNOSIS — J4530 Mild persistent asthma, uncomplicated: Secondary | ICD-10-CM

## 2020-04-08 DIAGNOSIS — M797 Fibromyalgia: Secondary | ICD-10-CM | POA: Diagnosis not present

## 2020-04-08 DIAGNOSIS — E538 Deficiency of other specified B group vitamins: Secondary | ICD-10-CM | POA: Diagnosis not present

## 2020-04-08 DIAGNOSIS — E785 Hyperlipidemia, unspecified: Secondary | ICD-10-CM | POA: Diagnosis not present

## 2020-04-08 MED ORDER — VITAMIN B-12 500 MCG SL SUBL: 1.0000 | SUBLINGUAL_TABLET | SUBLINGUAL | 0 refills | Status: AC

## 2020-04-08 MED ORDER — ROSUVASTATIN CALCIUM 20 MG PO TABS: 20.0000 mg | ORAL_TABLET | Freq: Every day | ORAL | 1 refills | Status: DC

## 2020-04-08 NOTE — Progress Notes (Signed)
Name: Yesenia Wood   MRN: [id number]    DOB: November 09, 1986   Date:04/08/2020       Progress Note  Subjective  Chief Complaint  Chief Complaint  Patient presents with  . Follow-up   Hearing impaired and   HPI  Headaches and paresthesias: seeing Dr. Malvin Johns and recently was started on Clonazepam but she states it is not working for her and seems to be causing more headaches. She tried to contact him but have not heard back from his office, discussed weaning self off .   Palpitation: sees Dr. Kirke Corin, episodes down to once or twice a week , doing better with metoprolol, she states episodes are a few hours long, sometimes associated with nausea and chest pain, but not always   Obesity: she has tried a low carb diet but is gaining weight, she has not responded to weight watchers. Discussed calorie counting or intermittent fasting. She will try to download the app Myfitnesspal. Her weight fluctuates, from 150's to 180's.   FMS: she hurts all over, worse when cold and with weather changes, she could not tolerate multiple therapies but unable to tolerate it, she has mental fogginess, fatigue that is worse after activity.   Dyslipidemia: doing well on Rosuvastatin, LDL is at goal, HDL low, discussed ways to increase good cholesterol   MDD: sees Dr. Elna Breslow and supposed to see therapist but unable to see them because she needs video calls and her phone is not working . phq 9 was normal today  Moderate asthma: she was seen by pulmonologist, does not have medications at home, she states none of the medications helped She continues to have intermittent sob, no wheezing, she has noticed a dry cough because of high pollen   AR: she has not been taking medication, states a little itchy right now ( worse after covid vaccine two weeks ago ) , also mild cough, taking benadryl prn for allergies.  Patient Active Problem List   Diagnosis Date Noted  . Medication nonadherence due to intolerance 12/31/2019  .  Numbness 12/26/2019  . Tingling 12/26/2019  . RLS (restless legs syndrome) 09/30/2019  . MDD (major depressive disorder), recurrent episode, moderate (HCC) 07/17/2019  . Social anxiety disorder 07/17/2019  . 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 OSA (obstructive sleep apnea) 10/10/2016  . 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  . Dissocial personality disorder (HCC) 06/07/2016  . Sleep disturbance 05/10/2016  . Chronic migraine without aura 03/29/2016  . Pain in the chest 06/09/2015  . Palpitations 06/09/2015  . Deaf 05/16/2015  . B12 deficiency 05/16/2015  . Cochlear implant status 05/16/2015  . Fatty infiltration of liver 05/16/2015  . Gastro-esophageal reflux disease without esophagitis 05/16/2015  . Generalized headache 05/16/2015  . Right-sided Bell's palsy 05/16/2015  . H/O: depression 05/16/2015  . Hypertriglyceridemia 05/16/2015  . Irregular menstrual cycle 05/16/2015  . Overweight 05/16/2015  . Paresthesia 05/16/2015  . Disorder of labyrinth 05/16/2015  . Dyspnea 10/07/2014  . Moderate persistent intrinsic asthma without status asthmaticus without complication 10/07/2014  . Migraine 08/05/2013  . Allergic rhinitis 07/01/2013  . Obesity (BMI 30-39.9) 07/01/2013  . Bartholin gland cyst 09/16/2008    Past Surgical History:  Procedure Laterality Date  . BARTHOLIN GLAND CYST REMOVAL    . COCHLEAR IMPLANT    . COCHLEAR IMPLANT REMOVAL    . LAPAROSCOPIC TUBAL LIGATION Bilateral 03/26/2018   Procedure: LAPAROSCOPIC TUBAL LIGATION;  Surgeon: Hildred Laser, MD;  Location: [facility] OR[facility];  Service: Gynecology;  Laterality: Bilateral;  with  peritoneal biopsies  . LAPAROSCOPY    . MYRINGOTOMY WITH TUBE PLACEMENT    . TUBAL LIGATION  03/26/2018    Family History  Adopted: Yes  Family history unknown: Yes    Social History   Tobacco Use  . Smoking status: Former Smoker    Packs/day: 1.00    Years: 2.00    Pack years: 2.00    Types: Cigarettes    Start date: 11/28/2004    Quit date:  11/28/2006    Years since quitting: 13.3  . Smokeless tobacco: Never Used  Substance Use Topics  . Alcohol use: Not Currently    Alcohol/week: 0.0 standard drinks    Comment: occass     Current Outpatient Medications:  .  Acetaminophen-Caff-Pyrilamine (MIDOL COMPLETE PO), Take by mouth., Disp: , Rfl:  .  clonazePAM (KLONOPIN) 0.5 MG tablet, Take 1 tablet by mouth in the morning and at bedtime., Disp: , Rfl:  .  diphenhydrAMINE (BENADRYL) 50 MG tablet, Take 50 mg by mouth at bedtime as needed for itching., Disp: , Rfl:  .  FOLIC ACID PO, Take by mouth., Disp: , Rfl:  .  metoprolol succinate (TOPROL-XL) 25 MG 24 hr tablet, TAKE 3 TABLETS BY MOUTH DAILY, Disp: 270 tablet, Rfl: 0 .  norethindrone (AYGESTIN) 5 MG tablet, TAKE 0.5 TABLETS BY MOUTH DAILY. TAKE 21 DAYS THEN OFF FOR 7 EACH MONTH, Disp: 45 tablet, Rfl: 4 .  rosuvastatin (CRESTOR) 20 MG tablet, Take 1 tablet (20 mg total) by mouth daily., Disp: 90 tablet, Rfl: 1 .  BOTOX 100 units SOLR injection, every 3 (three) months. , Disp: , Rfl:  .  Cyanocobalamin (VITAMIN B-12) 500 MCG SUBL, Place 1 tablet (500 mcg total) under the tongue once a week., Disp: 30 tablet, Rfl: 0 .  diclofenac Sodium (VOLTAREN) 1 % GEL, Apply 4 g topically 4 (four) times daily. (Patient not taking: Reported on 04/08/2020), Disp: 100 g, Rfl: 0 .  Elastic Bandages & Supports (ACE ANKLE BRACE LEFT/RIGHT) MISC, 1 each by Does not apply route daily. (Patient not taking: Reported on 04/08/2020), Disp: 1 each, Rfl: 0  Allergies  Allergen Reactions  . Budesonide-Formoterol Fumarate Anaphylaxis and Other (See Comments)    Other reaction(s): Other (See Comments) chest pain Chest pain chest pain  . Astelin [Azelastine]     Tingling, numbness, nausea  . Gabapentin Nausea And Vomiting  . Nortriptyline Other (See Comments)    Other reaction(s): Other (See Comments)  . Milnacipran Other (See Comments)    Pressure in eyes  . Tegaderm Ag Mesh [Silver] Itching  . Diclofenac  Other (See Comments)    Other reaction(s): Other (See Comments)  . Duloxetine Hcl Other (See Comments)    Other reaction(s): Other (See Comments) Other reaction(s): unknown  . Montelukast Sodium Other (See Comments)    Other reaction(s): wheezing/sob  . Mupirocin Other (See Comments)  . Naproxen Other (See Comments) and Rash    Other reaction(s): rash/itching  . Naproxen Sodium Rash    mild rash  . Other Rash  . Oxycodone Hcl Rash  . Sulfa Antibiotics Hives, Rash and Itching    I personally reviewed active problem list, medication list, allergies, family history, social history, health maintenance with the patient/caregiver today.   ROS  Constitutional: Negative for fever , positive for weight change.  Respiratory: positive for cough that is dry  and shortness of breath.   Cardiovascular: Negative for chest pain or palpitations.  Gastrointestinal: Negative for abdominal pain, no bowel changes.  Musculoskeletal: Negative for gait problem or joint swelling.  Skin: Negative for rash.  Neurological: Negative for dizziness ,she has recurrent  headache.  No other specific complaints in a complete review of systems (except as listed in HPI above).  Objective  Vitals:   04/08/20 1124  BP: 124/76  Pulse: 92  Resp: 16  Temp: (!) 97.3 F (36.3 C)  TempSrc: Temporal  SpO2: 98%  Weight: 187 lb 12.8 oz (85.2 kg)  Height: 5\' 3"  (1.6 m)    Body mass index is 33.27 kg/m.  Physical Exam  Constitutional: Patient appears well-developed and well-nourished. Obese  No distress.  HEENT: head atraumatic, normocephalic, pupils equal and reactive to light Cardiovascular: Normal rate, regular rhythm and normal heart sounds.  No murmur heard. No BLE edema. Pulmonary/Chest: Effort normal and breath sounds normal. No respiratory distress. Abdominal: Soft.  There is no tenderness. Psychiatric: Patient has a normal mood and affect. behavior is normal. Judgment and thought content  normal.  PHQ2/9: Depression screen [facility] 2/9 04/08/2020 01/28/2020 10/10/2019 10/01/2019 09/25/2018  Decreased Interest 0 1 1 1 2   Down, Depressed, Hopeless 0 1 1 1 1   PHQ - 2 Score 0 2 2 2 3   Altered sleeping 0 2 3 3 3   Tired, decreased energy 0 2 3 3 3   Change in appetite 0 1 1 1 2   Feeling bad or failure about yourself  0 0 1 1 1   Trouble concentrating 0 1 1 1 2   Moving slowly or fidgety/restless 0 1 1 2 3   Suicidal thoughts 0 0 0 0 0  PHQ-9 Score 0 9 12 13 17   Difficult doing work/chores Not difficult at all Somewhat difficult Somewhat difficult Somewhat difficult Somewhat difficult  Some recent data might be hidden    phq 9 is negative   Fall Risk: Fall Risk  04/08/2020 01/28/2020 10/10/2019 10/01/2019 09/25/2018  Falls in the past year? 0 1 1 0 No  Number falls in past yr: 0 1 1 0 -  Comment - [place] farm - - -  Injury with Fall? 0 1 0 0 -  Comment - ankle - - -  Risk Factor Category  - - - - -  Follow up Falls evaluation completed - Falls prevention discussed - -     Functional Status Survey: Is the patient deaf or have difficulty hearing?: Yes Does the patient have difficulty seeing, even when wearing glasses/contacts?: No Does the patient have difficulty concentrating, remembering, or making decisions?: No Does the patient have difficulty walking or climbing stairs?: No Does the patient have difficulty dressing or bathing?: No Does the patient have difficulty doing errands alone such as visiting a doctor's office or shopping?: No   Assessment & Plan  1. Dyslipidemia  - rosuvastatin (CRESTOR) 20 MG tablet; Take 1 tablet (20 mg total) by mouth daily.  Dispense: 90 tablet; Refill: 1  2. B12 deficiency  Continue supplementation   3. Fibromyalgia syndrome  stable  4. Palpitation  Doing better   5. Chronic migraine without aura without status migrainosus, not intractable  Under the care of Dr. Melrose Nakayama   6. Mild persistent asthma without complication  Not  on inhalers

## 2020-04-20 ENCOUNTER — Encounter: Payer: Self-pay | Admitting: Emergency Medicine

## 2020-04-20 ENCOUNTER — Emergency Department
Admission: EM | Admit: 2020-04-20 | Discharge: 2020-04-20 | Disposition: A | Discharge status: Home / Self Care | Payer: Medicare Other | Attending: Emergency Medicine | Admitting: Emergency Medicine

## 2020-04-20 ENCOUNTER — Other Ambulatory Visit: Payer: Self-pay

## 2020-04-20 ENCOUNTER — Ambulatory Visit: Payer: Self-pay | Admitting: Family Medicine

## 2020-04-20 ENCOUNTER — Emergency Department: Payer: Medicare Other

## 2020-04-20 DIAGNOSIS — S76812A Strain of other specified muscles, fascia and tendons at thigh level, left thigh, initial encounter: Secondary | ICD-10-CM | POA: Diagnosis not present

## 2020-04-20 DIAGNOSIS — I10 Essential (primary) hypertension: Secondary | ICD-10-CM | POA: Insufficient documentation

## 2020-04-20 DIAGNOSIS — Y929 Unspecified place or not applicable: Secondary | ICD-10-CM | POA: Diagnosis not present

## 2020-04-20 DIAGNOSIS — Y999 Unspecified external cause status: Secondary | ICD-10-CM | POA: Insufficient documentation

## 2020-04-20 DIAGNOSIS — Y9301 Activity, walking, marching and hiking: Secondary | ICD-10-CM | POA: Diagnosis not present

## 2020-04-20 DIAGNOSIS — S8991XA Unspecified injury of right lower leg, initial encounter: Secondary | ICD-10-CM | POA: Diagnosis not present

## 2020-04-20 DIAGNOSIS — S76912A Strain of unspecified muscles, fascia and tendons at thigh level, left thigh, initial encounter: Secondary | ICD-10-CM | POA: Diagnosis not present

## 2020-04-20 DIAGNOSIS — X501XXA Overexertion from prolonged static or awkward postures, initial encounter: Secondary | ICD-10-CM | POA: Insufficient documentation

## 2020-04-20 DIAGNOSIS — Z87891 Personal history of nicotine dependence: Secondary | ICD-10-CM | POA: Diagnosis not present

## 2020-04-20 DIAGNOSIS — S79922A Unspecified injury of left thigh, initial encounter: Secondary | ICD-10-CM | POA: Diagnosis present

## 2020-04-20 DIAGNOSIS — T148XXA Other injury of unspecified body region, initial encounter: Secondary | ICD-10-CM

## 2020-04-20 DIAGNOSIS — J4540 Moderate persistent asthma, uncomplicated: Secondary | ICD-10-CM | POA: Insufficient documentation

## 2020-04-20 NOTE — ED Notes (Signed)
Discharged [phone] interprter

## 2020-04-20 NOTE — ED Provider Notes (Signed)
[facility] Emergency Department Provider Note ____________________________________________  Time seen: Approximately 7:55 PM  I have reviewed the triage vital signs and the nursing notes.   HISTORY  Chief Complaint Leg Pain    HPI Yesenia Wood is a 34 y.o. female who presents to the emergency department for evaluation and treatment of left thigh pain.  Last week, she slipped and her legs slid and she can of did a split.  She felt some pain to the inner thigh at that time but then the pain has also moves to the top of the left leg.  She has had some tingling sensation to the upper leg as well as mild right calf and foot swelling.  Primary care advised her to come to the emergency department to rule out DVT.  No previous DVT.  No chest pain or shortness of breath.   Past Medical History:  Diagnosis Date  . ADHD (attention deficit hyperactivity disorder)   . B12 deficiency   . Bell's palsy    right sided  . Chronic migraine   . Deaf   . Depression   . 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 Fatigue   . Fatty liver   . Fibromyalgia   . GERD (gastroesophageal reflux disease)   . Headache   . High triglycerides   . Hyperglycemia   . Hypertension   . Hyperthyroidism   . Moderate asthma   . Obesity   . Paresthesia   . Premature birth     Patient Active Problem List   Diagnosis Date Noted  . Medication nonadherence due to intolerance 12/31/2019  . Numbness 12/26/2019  . Tingling 12/26/2019  . RLS (restless legs syndrome) 09/30/2019  . MDD (major depressive disorder), recurrent episode, moderate (HCC) 07/17/2019  . Social anxiety disorder 07/17/2019  . 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 OSA (obstructive sleep apnea) 10/10/2016  . 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  . Dissocial personality disorder (HCC) 06/07/2016  . Sleep disturbance 05/10/2016  . Chronic migraine without aura 03/29/2016  . Pain  in the chest 06/09/2015  . Palpitations 06/09/2015  . Deaf 05/16/2015  . B12 deficiency 05/16/2015  . Cochlear implant status 05/16/2015  . Fatty infiltration of liver 05/16/2015  . Gastro-esophageal reflux disease without esophagitis 05/16/2015  . Generalized headache 05/16/2015  . Right-sided Bell's palsy 05/16/2015  . H/O: depression 05/16/2015  . Hypertriglyceridemia 05/16/2015  . Irregular menstrual cycle 05/16/2015  . Overweight 05/16/2015  . Paresthesia 05/16/2015  . Disorder of labyrinth 05/16/2015  . Dyspnea 10/07/2014  . Moderate persistent intrinsic asthma without status asthmaticus without complication 10/07/2014  . Migraine 08/05/2013  . Allergic rhinitis 07/01/2013  . Obesity (BMI 30-39.9) 07/01/2013  . Bartholin gland cyst 09/16/2008    Past Surgical History:  Procedure Laterality Date  . BARTHOLIN GLAND CYST REMOVAL    . COCHLEAR IMPLANT    . COCHLEAR IMPLANT REMOVAL    . LAPAROSCOPIC TUBAL LIGATION Bilateral 03/26/2018   Procedure: LAPAROSCOPIC TUBAL LIGATION;  Surgeon: Hildred Laser, MD;  Location: [facility] ORS;  Service: Gynecology;  Laterality: Bilateral;  with peritoneal biopsies  . LAPAROSCOPY    . MYRINGOTOMY WITH TUBE PLACEMENT    . TUBAL LIGATION  03/26/2018    Prior to Admission medications   Medication Sig Start Date End Date Taking? Authorizing Provider  Acetaminophen-Caff-Pyrilamine (MIDOL COMPLETE PO) Take by mouth.    [provider]  BOTOX 100 units SOLR injection every 3 (three) months.  01/09/19   [provider]  clonazePAM (KLONOPIN) 0.5 MG tablet Take 1 tablet by mouth in the morning and at bedtime. 12/25/19   Morene Crocker, MD  Cyanocobalamin (VITAMIN B-12) 500 MCG SUBL Place 1 tablet (500 mcg total) under the tongue once a week. 04/08/20   Alba Cory, MD  diclofenac Sodium (VOLTAREN) 1 % GEL Apply 4 g topically 4 (four) times daily. Patient not taking: Reported on 04/08/2020 01/28/20   Alba Cory, MD   diphenhydrAMINE (BENADRYL) 50 MG tablet Take 50 mg by mouth at bedtime as needed for itching.    [provider]  Elastic Bandages & Supports (ACE ANKLE BRACE LEFT/RIGHT) MISC 1 each by Does not apply route daily. Patient not taking: Reported on 04/08/2020 01/28/20   Alba Cory, MD  FOLIC ACID PO Take by mouth.    [provider]  metoprolol succinate (TOPROL-XL) 25 MG 24 hr tablet TAKE 3 TABLETS BY MOUTH DAILY 03/09/20   Iran Ouch, MD  norethindrone (AYGESTIN) 5 MG tablet TAKE 0.5 TABLETS BY MOUTH DAILY. TAKE 21 DAYS THEN OFF FOR 7 EACH MONTH 03/13/20   Hildred Laser, MD  rosuvastatin (CRESTOR) 20 MG tablet Take 1 tablet (20 mg total) by mouth daily. 04/08/20   Alba Cory, MD    Allergies Budesonide-formoterol fumarate, Astelin [azelastine], Gabapentin, Nortriptyline, Milnacipran, Tegaderm ag mesh [silver], Diclofenac, Duloxetine hcl, Montelukast sodium, Mupirocin, Naproxen, Naproxen sodium, Other, Oxycodone hcl, and Sulfa antibiotics  Family History  Adopted: Yes  Family history unknown: Yes    Social History Social History   Tobacco Use  . Smoking status: Former Smoker    Packs/day: 1.00    Years: 2.00    Pack years: 2.00    Types: Cigarettes    Start date: 11/28/2004    Quit date: 11/28/2006    Years since quitting: 13.4  . Smokeless tobacco: Never Used  Substance Use Topics  . Alcohol use: Not Currently    Alcohol/week: 0.0 standard drinks    Comment: occass  . Drug use: No    Review of Systems Constitutional: Negative for fever. Cardiovascular: Negative for chest pain. Respiratory: Negative for shortness of breath. Musculoskeletal: Positive for left leg pain. Skin: Negative for open wounds or lesions. Neurological: Negative for decrease in sensation  ____________________________________________   PHYSICAL EXAM:  VITAL SIGNS: ED Triage Vitals  Enc Vitals Group     BP 04/20/20 1818 122/85     Pulse Rate 04/20/20 1818 84     Resp  04/20/20 1818 16     Temp 04/20/20 1818 98.8 F (37.1 C)     Temp Source 04/20/20 1818 Oral     SpO2 04/20/20 1818 99 %     Weight 04/20/20 1819 178 lb (80.7 kg)     Height 04/20/20 1819 5\' 3"  (1.6 m)     Head Circumference --      Peak Flow --      Pain Score 04/20/20 1826 4     Pain Loc --      Pain Edu? --      Excl. in GC? --     Constitutional: Alert and oriented. Well appearing and in no acute distress. Eyes: Conjunctivae are clear without discharge or drainage Head: Atraumatic Neck: Supple. Respiratory: No cough. Respirations are even and unlabored. Musculoskeletal: No obvious swelling to the left lower extremity.  Tenderness along the medial aspect of the upper thigh. Neurologic: Motor and sensory function is intact. Skin: No hotspots, erythema, open wounds or  lesions. Psychiatric: Affect and behavior are appropriate.  ____________________________________________   LABS (all labs ordered are listed, but only abnormal results are displayed)  Labs Reviewed - No data to display ____________________________________________  RADIOLOGY  No evidence of DVT.  US Venous Img Lower Unilateral Right  Result Date: 04/20/2020 CLINICAL DATA:  Right lower extremity pain after fall. EXAM: Right LOWER EXTREMITY VENOUS DOPPLER ULTRASOUND TECHNIQUE: Gray-scale sonography with compression, as well as color and duplex ultrasound, were performed to evaluate the deep venous system(s) from the level of the common femoral vein through the popliteal and proximal calf veins. COMPARISON:  None. FINDINGS: VENOUS Normal compressibility of the common femoral, superficial femoral, and popliteal veins, as well as the visualized calf veins. Visualized portions of profunda femoral vein and great saphenous vein unremarkable. No filling defects to suggest DVT on grayscale or color Doppler imaging. Doppler waveforms show normal direction of venous flow, normal respiratory plasticity and response to  augmentation. Limited views of the contralateral common femoral vein are unremarkable. OTHER None. Limitations: none IMPRESSION: Negative. Electronically Signed   By: Marijo Conception M.D.   On: 04/20/2020 19:05   ____________________________________________   PROCEDURES  Procedures  ____________________________________________   INITIAL IMPRESSION / ASSESSMENT AND PLAN / ED COURSE  ZARIELLE CEA is a 34 y.o. who presents to the emergency department for treatment and evaluation of left leg pain.  See HPI for further details.  Ultrasound shows no acute DVT or other acute findings.  She was advised to continue taking her daily medication as prescribed by primary care.  She was also encouraged to use ice or heat whichever feels better at this point as well as ibuprofen and rest.  Patient instructed to follow-up with primary care.  She was also instructed to return to the emergency department for symptoms that change or worsen if unable schedule an appointment with orthopedics or primary care.  Medications - No data to display  Pertinent labs & imaging results that were available during my care of the patient were reviewed by me and considered in my medical decision making (see chart for details).   _________________________________________   FINAL CLINICAL IMPRESSION(S) / ED DIAGNOSES  Final diagnoses:  Musculoskeletal strain    ED Discharge Orders    None       If controlled substance prescribed during this visit, 12 month history viewed on the [place] prior to issuing an initial prescription for Schedule II or III opiod.   Victorino Dike, FNP 04/20/20 Patrecia Pour    Nance Pear, MD 04/20/20 [id number]

## 2020-04-20 NOTE — ED Notes (Signed)
Pt in ultrasound; will be brought to room when complete.

## 2020-04-20 NOTE — Telephone Encounter (Signed)
Pt states tripped and fell 1 week ago, last Monday. States "Felt like I pulled a muscle right inner thigh. " States today right arm tingling "Feels heavy, little numb" States "I can move it but not as much as usual."  Also reports right calf and foot swollen. States "Noticeable." Denies redness or warmth of right calf. Also reports pain at back of neck. After hours call, pt directed to UC. States will follow disposition.   Reason for Disposition . [1] Thigh or calf pain AND [2] only 1 side AND [3] present > 1 hour  Answer Assessment - Initial Assessment Questions 1. ONSET: "When did the swelling start?" (e.g., minutes, hours, days)     This am 2. LOCATION: "What part of the leg is swollen?"  "Are both legs swollen or just one leg?"     Foot and calf 3. SEVERITY: "How bad is the swelling?" (e.g., localized; mild, moderate, severe)  - Localized - small area of swelling localized to one leg  - MILD pedal edema - swelling limited to foot and ankle, pitting edema < 1/4 inch (6 mm) deep, rest and elevation eliminate most or all swelling  - MODERATE edema - swelling of lower leg to knee, pitting edema > 1/4 inch (6 mm) deep, rest and elevation only partially reduce swelling  - SEVERE edema - swelling extends above knee, facial or hand swelling present     No pain today 4. REDNESS: "Does the swelling look red or infected?"     no 5. PAIN: "Is the swelling painful to touch?" If so, ask: "How painful is it?"   (Scale 1-10; mild, moderate or severe)     pain 6. FEVER: "Do you have a fever?" If so, ask: "What is it, how was it measured, and when did it start?"      no 7. CAUSE: "What do you think is causing the leg swelling?"     S/P fall 8. MEDICAL HISTORY: "Do you have a history of heart failure, kidney disease, liver failure, or cancer?"     no 9. RECURRENT SYMPTOM: "Have you had leg swelling before?" If so, ask: "When was the last time?" "What happened that time?"    no 10. OTHER SYMPTOMS: "Do  you have any other symptoms?" (e.g., chest pain, difficulty breathing)     Tingling right arm, good ROM.  Protocols used: LEG SWELLING AND EDEMA-A-AH

## 2020-04-20 NOTE — ED Triage Notes (Signed)
Sent to ED from Urgent Care.  Last week patient slipped and kinda did the splits, initally felt some pain to left inner thigh, but then pain moved to top of leg.  Then started to feel numbness to right upper leg, then right calf and foot started to be swollen.  Called PCP, sent patient to be evaluated to r/o DVT vs. Muscle pull.  States she is able to walk independently, but right leg feels a little weird to walk on it.

## 2020-04-20 NOTE — Discharge Instructions (Signed)
Take Tylenol or Ibuprofen for pain. Rest, ice, and elevate.  Follow up with primary care for symptoms that are not improving over the week

## 2020-04-21 NOTE — Telephone Encounter (Signed)
Called the interpreter line and had them call patient to see if she went to UC. Interpreter left message, she was unable to contact patient.  I saw know record on chart

## 2020-04-21 NOTE — Telephone Encounter (Signed)
Patient stated she went to [facility] on [address]  and was sent to ER and had a [place]. They stated she had bad muscle inflammation. Patient stated she was ok. Was told follow up in 1 week

## 2020-04-23 DIAGNOSIS — G43109 Migraine with aura, not intractable, without status migrainosus: Secondary | ICD-10-CM | POA: Diagnosis not present

## 2020-04-23 DIAGNOSIS — G43111 Migraine with aura, intractable, with status migrainosus: Secondary | ICD-10-CM | POA: Diagnosis not present

## 2020-04-23 DIAGNOSIS — M542 Cervicalgia: Secondary | ICD-10-CM | POA: Diagnosis not present

## 2020-04-23 DIAGNOSIS — G518 Other disorders of facial nerve: Secondary | ICD-10-CM | POA: Diagnosis not present

## 2020-04-23 DIAGNOSIS — M791 Myalgia, unspecified site: Secondary | ICD-10-CM | POA: Diagnosis not present

## 2020-04-23 DIAGNOSIS — G43719 Chronic migraine without aura, intractable, without status migrainosus: Secondary | ICD-10-CM | POA: Diagnosis not present

## 2020-04-28 DIAGNOSIS — M797 Fibromyalgia: Secondary | ICD-10-CM | POA: Diagnosis not present

## 2020-04-28 DIAGNOSIS — R202 Paresthesia of skin: Secondary | ICD-10-CM | POA: Diagnosis not present

## 2020-04-28 DIAGNOSIS — M542 Cervicalgia: Secondary | ICD-10-CM | POA: Diagnosis not present

## 2020-04-28 DIAGNOSIS — R531 Weakness: Secondary | ICD-10-CM | POA: Diagnosis not present

## 2020-04-28 DIAGNOSIS — R200 Anesthesia of skin: Secondary | ICD-10-CM | POA: Diagnosis not present

## 2020-04-28 DIAGNOSIS — R519 Headache, unspecified: Secondary | ICD-10-CM | POA: Diagnosis not present

## 2020-04-28 DIAGNOSIS — G479 Sleep disorder, unspecified: Secondary | ICD-10-CM | POA: Diagnosis not present

## 2020-04-29 ENCOUNTER — Telehealth: Payer: Self-pay | Admitting: Family Medicine

## 2020-04-29 NOTE — Chronic Care Management (AMB) (Signed)
  Chronic Care Management   Note  04/29/2020 Name: [name] MRN: [id number] DOB: January 15, 1986  Yesenia Wood is a 34 y.o. year old female who is a primary care patient of Steele Sizer, MD. I reached out to Delight Ovens by phone today in response to a referral sent by [name]'s health plan.     [name] was given information about Chronic Care Management services today including:  1. CCM service includes personalized support from designated clinical staff supervised by her physician, including individualized plan of care and coordination with other care providers 2. 24/7 contact phone numbers for assistance for urgent and routine care needs. 3. Service will only be billed when office clinical staff spend 20 minutes or more in a month to coordinate care. 4. Only one practitioner may furnish and bill the service in a calendar month. 5. The patient may stop CCM services at any time (effective at the end of the month) by phone call to the office staff. 6. The patient will be responsible for cost sharing (co-pay) of up to 20% of the service fee (after annual deductible is met).  Patient wishes to consider information provided and/or speak with a member of the care team before deciding about enrollment in care management services.   Follow up plan: The patient has been provided with contact information for the care management team and has been advised to call with any health related questions or concerns.   [name], [place], [place], [place] [id number] Direct Dial: [phone] [name] Website: [email or web address]

## 2020-04-30 DIAGNOSIS — G479 Sleep disorder, unspecified: Secondary | ICD-10-CM | POA: Insufficient documentation

## 2020-05-31 ENCOUNTER — Other Ambulatory Visit: Payer: Self-pay | Admitting: Cardiovascular Disease

## 2020-06-05 DIAGNOSIS — G518 Other disorders of facial nerve: Secondary | ICD-10-CM | POA: Diagnosis not present

## 2020-06-05 DIAGNOSIS — M542 Cervicalgia: Secondary | ICD-10-CM | POA: Diagnosis not present

## 2020-06-05 DIAGNOSIS — G43109 Migraine with aura, not intractable, without status migrainosus: Secondary | ICD-10-CM | POA: Diagnosis not present

## 2020-06-05 DIAGNOSIS — G43719 Chronic migraine without aura, intractable, without status migrainosus: Secondary | ICD-10-CM | POA: Diagnosis not present

## 2020-06-05 DIAGNOSIS — M791 Myalgia, unspecified site: Secondary | ICD-10-CM | POA: Diagnosis not present

## 2020-06-05 DIAGNOSIS — G43111 Migraine with aura, intractable, with status migrainosus: Secondary | ICD-10-CM | POA: Diagnosis not present

## 2020-07-05 DIAGNOSIS — R300 Dysuria: Secondary | ICD-10-CM | POA: Diagnosis not present

## 2020-07-05 DIAGNOSIS — N390 Urinary tract infection, site not specified: Secondary | ICD-10-CM | POA: Diagnosis not present

## 2020-07-15 ENCOUNTER — Encounter: Payer: Self-pay | Admitting: Obstetrics and Gynecology

## 2020-07-15 ENCOUNTER — Other Ambulatory Visit: Payer: Self-pay

## 2020-07-15 ENCOUNTER — Ambulatory Visit (INDEPENDENT_AMBULATORY_CARE_PROVIDER_SITE_OTHER): Payer: Medicare Other | Admitting: Obstetrics and Gynecology

## 2020-07-15 VITALS — BP 114/80 | HR 81 | Ht 63.0 in | Wt 183.6 lb

## 2020-07-15 DIAGNOSIS — E7212 Methylenetetrahydrofolate reductase deficiency: Secondary | ICD-10-CM | POA: Diagnosis not present

## 2020-07-15 DIAGNOSIS — D688 Other specified coagulation defects: Secondary | ICD-10-CM | POA: Diagnosis not present

## 2020-07-15 DIAGNOSIS — Z1589 Genetic susceptibility to other disease: Secondary | ICD-10-CM

## 2020-07-15 DIAGNOSIS — N946 Dysmenorrhea, unspecified: Secondary | ICD-10-CM

## 2020-07-15 DIAGNOSIS — N939 Abnormal uterine and vaginal bleeding, unspecified: Secondary | ICD-10-CM

## 2020-07-15 DIAGNOSIS — Z87440 Personal history of urinary (tract) infections: Secondary | ICD-10-CM | POA: Diagnosis not present

## 2020-07-15 DIAGNOSIS — N921 Excessive and frequent menstruation with irregular cycle: Secondary | ICD-10-CM

## 2020-07-15 LAB — POCT URINALYSIS DIPSTICK
Bilirubin, UA: NEGATIVE
Glucose, UA: NEGATIVE
Ketones, UA: NEGATIVE
Leukocytes, UA: NEGATIVE
Nitrite, UA: NEGATIVE
Protein, UA: NEGATIVE
Spec Grav, UA: >=1.03 — AB (ref 1.010–1.025)
Urobilinogen, UA: 0.2 E.U./dL
pH, UA: 6 (ref 5.0–8.0)

## 2020-07-15 MED ORDER — NORETHINDRONE ACETATE 5 MG PO TABS: 5.0000 mg | ORAL_TABLET | Freq: Every day | ORAL | 2 refills | Status: DC

## 2020-07-15 NOTE — Progress Notes (Signed)
GYNECOLOGY PROGRESS NOTE  Subjective:    Patient ID: Yesenia Wood, female    DOB: [birth year], 34 y.o.   MRN: [id number]  [employer] in-person interpreter used for today for encounter as patient is hearing impaired.   HPI  Patient is a 34 y.o. [id number] female who presents for complaints of irregular bleeding and dysmenorrhea.  Patient is currently on Seasonale.  She has been on this medication for approximately 1 year.  She denies missing any pills.  She has a prior history of abnormal uterine bleeding and painful periods.  Currently she states that her cycles are lasting ~ 2 weeks. They are very heavy.  She is using a Diva cup, changing 4 times per day but also using a box of pads in 1 week due to the overflow.. She notes taking ibuprofen and Tylenol however this does not always help.  She is not currently sexually active and has had a tubal ligation for contraception.    Patient also reports that she was diagnosed recently with a UTI.  She has completed her antibiotic course however still noting residual symptoms of discomfort with urination.  She also states she is still noting some blood in her urine.  Has been consuming lots of water and has was taking Azo with the onset of her symptoms.  Lastly, patient reports that she recently had some genetic testing done due to issues with anemia.  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    The following portions of the patient's history were reviewed and updated as appropriate: allergies, current medications, past family history, past medical history, past social history, past surgical history and problem list.  Review of Systems Pertinent items noted in HPI and remainder of comprehensive ROS otherwise negative.   Objective:   Blood pressure 114/80, pulse 81, height 5\' 3"  (1.6 m), weight 183 lb 9.6 oz (83.3 kg), last menstrual period 07/13/2020. General appearance: alert and no distress Abdomen: soft, non-tender; bowel  sounds normal; no masses,  no organomegaly Pelvic: deferred.   Labs:  Results for orders placed or performed in visit on 07/15/20  POCT urinalysis dipstick  Result Value Ref Range   Color, UA yellow    Clarity, UA clear    Glucose, UA Negative Negative   Bilirubin, UA neg    Ketones, UA neg    Spec Grav, UA >=1.030 (A) 1.010 - 1.025   Blood, UA large +3    pH, UA 6.0 5.0 - 8.0   Protein, UA Negative Negative   Urobilinogen, UA 0.2 0.2 or 1.0 E.U./dL   Nitrite, UA neg    Leukocytes, UA Negative Negative   Appearance yellow;clear    Odor      Assessment:   1. Abnormal uterine bleeding   2. Breakthrough bleeding on OCPs   3. Dysmenorrhea, unspecified    4. Other specified coagulation defects (HCC)    5. History of UTI   6. MTHFR gene mutation (HCC)   7. Dysmenorrhea    Plan:   1.  Patient with abnormal uterine bleeding currently on OCPs.  She has had this issue in the past.  She has tried multiple interventions to date, including several different OCPs, IUD, Depo-Provera, and a trial of 07/17/20.  She also had a work-up for her abnormal bleeding and dysmenorrhea in the past, however that work-up was negative for endometriosis.  Discussed limitations as patient has tried multiple options, only other options available include hormonal suppression with  oral progesterone (Aygestin), danazol, Depo-Lupron(if insurance allows), or consideration of surgical intervention.  Patient notes at this time she does not desire surgical intervention as she is concerned about having surgery during the Covid pandemic.  Patient okay to try Aygestin.  Will prescribe 5 mg to start.  Can discontinue Seasonale at this time. 2.  Dysmenorrhea -hopefully will be helped with menstrual cycle suppression with Aygestin.  Advised to continue current OTC regimens at this time until she initiates the new medication.  If she is still experiencing dysmenorrhea can consider prescription for stronger pain medication. 3.  MTHFR mutation -discussed that this can have some effect on fertility, however patient does not desire childbearing.  Not likely a significant cause of her bleeding. Also will order bleeding profile as patient has not been responsive to multiple interventions with regards to her cycles.  4.  History of UTI -patient with residual UTI symptoms status post treatment.  UA today not noting any significant evidence of persistent infection.  Given patient samples of Uribel to help with her urinary symptoms.  Advised to continue to aggressively hydrate. 5.  Patient to return in 3 months for reassessment of her symptoms.   A total of 15 minutes were spent face-to-face with the patient during this encounter and over half of that time dealt with counseling and coordination of care.   Hildred Laser, MD [facility]

## 2020-07-15 NOTE — Progress Notes (Signed)
Pt present due to having dysmenorrhea. Pt c/o of heavy cycles with clots and prolonged bleeding.

## 2020-07-16 ENCOUNTER — Encounter: Payer: Self-pay | Admitting: Obstetrics and Gynecology

## 2020-07-16 DIAGNOSIS — R200 Anesthesia of skin: Secondary | ICD-10-CM | POA: Diagnosis not present

## 2020-07-16 DIAGNOSIS — M797 Fibromyalgia: Secondary | ICD-10-CM | POA: Diagnosis not present

## 2020-07-16 DIAGNOSIS — E538 Deficiency of other specified B group vitamins: Secondary | ICD-10-CM | POA: Diagnosis not present

## 2020-07-16 DIAGNOSIS — R531 Weakness: Secondary | ICD-10-CM | POA: Diagnosis not present

## 2020-07-16 DIAGNOSIS — R202 Paresthesia of skin: Secondary | ICD-10-CM | POA: Diagnosis not present

## 2020-07-16 DIAGNOSIS — M542 Cervicalgia: Secondary | ICD-10-CM | POA: Diagnosis not present

## 2020-07-16 DIAGNOSIS — R519 Headache, unspecified: Secondary | ICD-10-CM | POA: Diagnosis not present

## 2020-07-16 DIAGNOSIS — G479 Sleep disorder, unspecified: Secondary | ICD-10-CM | POA: Diagnosis not present

## 2020-07-17 ENCOUNTER — Ambulatory Visit: Payer: Medicare Other | Admitting: Cardiovascular Disease

## 2020-07-17 LAB — PROTIME-INR
INR: 1 (ref 0.9–1.2)
Prothrombin Time: 10 s (ref 9.1–12.0)

## 2020-07-17 LAB — CBC
Hematocrit: 42.3 % (ref 34.0–46.6)
Hemoglobin: 14.4 g/dL (ref 11.1–15.9)
MCH: 30.1 pg (ref 26.6–33.0)
MCHC: 34 g/dL (ref 31.5–35.7)
MCV: 89 fL (ref 79–97)
Platelets: 259 10*3/uL (ref 150–450)
RBC: 4.78 x10E6/uL (ref 3.77–5.28)
RDW: 13.6 % (ref 11.7–15.4)
WBC: 6.3 10*3/uL (ref 3.4–10.8)

## 2020-07-17 LAB — VON WILLEBRAND PANEL
Factor VIII Activity: 103 % (ref 56–140)
Von Willebrand Ag: 72 % (ref 50–200)
Von Willebrand Factor: 58 % (ref 50–200)

## 2020-07-17 LAB — COAG STUDIES INTERP REPORT

## 2020-07-17 LAB — TSH: TSH: 1.82 u[IU]/mL (ref 0.450–4.500)

## 2020-07-17 LAB — APTT: aPTT: 29 s (ref 24–33)

## 2020-07-22 ENCOUNTER — Telehealth: Payer: Self-pay

## 2020-07-22 ENCOUNTER — Telehealth (INDEPENDENT_AMBULATORY_CARE_PROVIDER_SITE_OTHER): Payer: Medicare Other | Admitting: Psychiatry

## 2020-07-22 ENCOUNTER — Encounter: Payer: Self-pay | Admitting: Psychiatry

## 2020-07-22 ENCOUNTER — Other Ambulatory Visit: Payer: Self-pay

## 2020-07-22 DIAGNOSIS — F5105 Insomnia due to other mental disorder: Secondary | ICD-10-CM | POA: Diagnosis not present

## 2020-07-22 DIAGNOSIS — F4010 Social phobia, unspecified: Secondary | ICD-10-CM | POA: Diagnosis not present

## 2020-07-22 DIAGNOSIS — F331 Major depressive disorder, recurrent, moderate: Secondary | ICD-10-CM

## 2020-07-22 MED ORDER — VORTIOXETINE HBR 5 MG PO TABS: 5.0000 mg | ORAL_TABLET | Freq: Every day | ORAL | 1 refills | Status: DC

## 2020-07-22 NOTE — Progress Notes (Signed)
Provider Location : [facility] Patient Location : Home  Participants: Patient , video enabled ASL interpreter, provider  Virtual Visit via Telephone Note  I connected with Yesenia Wood on 07/22/20 at  1:00 PM EDT by telephone and verified that I am speaking with the correct person using two identifiers.   I discussed the limitations, risks, security and privacy concerns of performing an evaluation and management service by telephone and the availability of in person appointments. I also discussed with the patient that there may be a patient responsible charge related to this service. The patient expressed understanding and agreed to proceed.      I discussed the assessment and treatment plan with the patient. The patient was provided an opportunity to ask questions and all were answered. The patient agreed with the plan and demonstrated an understanding of the instructions.   The patient was advised to call back or seek an in-person evaluation if the symptoms worsen or if the condition fails to improve as anticipated.   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  Patient today reports she wants to get back on her medications as well as psychotherapy sessions.  She has not been in psychotherapy or on her medications since February 2021.  She reports she continues to struggle with depressive symptoms like sadness, crying spell, lack of motivation and low energy.    She also reports sleep issues.  She reports she has been trying to work on her sleep hygiene and goes to bed early these days.  That does help to  some extent.  She however reports sleep continues to be interrupted on and off.  She also reports hypersomnia during the day, she can easily doze off in several situations including sitting and reading, sitting in a car, sitting in a meeting or other public places.  She reports she often feels fatigued and tired and unrested when she wakes up in the morning.  This has been getting worse since the past few months.  Patient reports racing thoughts and reports possibly hearing her thoughts out loud which also distresses her.  Patient denies any suicidality, homicidality or perceptual disturbances.  Patient denies any other concerns today.   Visit Diagnosis:    ICD-10-CM   1. MDD (major depressive disorder), recurrent episode, moderate (HCC)  F33.1 vortioxetine HBr (TRINTELLIX) 5 MG TABS tablet  2. Social anxiety disorder  F40.10 vortioxetine HBr (TRINTELLIX) 5 MG TABS tablet  3. Insomnia due to mental condition  F51.05     Past Psychiatric History: I have reviewed past psychiatric history from my progress note on 09/10/2018.  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   Fatigue    Fatty liver    Fibromyalgia  GERD (gastroesophageal reflux disease)    Headache    High triglycerides    Hyperglycemia    Hypertension    Hyperthyroidism    Moderate asthma    Obesity    Paresthesia    Premature birth     Past Surgical History:  Procedure Laterality Date   BARTHOLIN GLAND CYST REMOVAL     COCHLEAR IMPLANT     COCHLEAR IMPLANT REMOVAL     LAPAROSCOPIC TUBAL LIGATION  Bilateral 03/26/2018   Procedure: LAPAROSCOPIC TUBAL LIGATION;  Surgeon: Hildred Laserherry, Anika, MD;  Location: [facility];  Service: Gynecology;  Laterality: Bilateral;  with peritoneal biopsies   LAPAROSCOPY     MYRINGOTOMY WITH TUBE PLACEMENT     TUBAL LIGATION  03/26/2018    Family Psychiatric History: I have reviewed family psychiatric history from my progress note on 09/10/2018  Family History:  Family History  Adopted: Yes  Family history unknown: Yes    Social History: Reviewed social history from my progress note on 09/10/2018 Social History   Socioeconomic History   Marital status: Single    Spouse name: Not on file   Number of children: 0   Years of education: Not on file   Highest education level: Some college, no degree  Occupational History   Occupation: disability for hearing loss  Tobacco Use   Smoking status: Former Smoker    Packs/day: 1.00    Years: 2.00    Pack years: 2.00    Types: Cigarettes    Start date: 11/28/2004    Quit date: 11/28/2006    Years since quitting: 13.6   Smokeless tobacco: Never Used  Vaping Use   Vaping Use: Never used  Substance and Sexual Activity   Alcohol use: Yes    Alcohol/week: 0.0 standard drinks    Comment: occass   Drug use: No   Sexual activity: Not Currently    Birth control/protection: Pill    Comment: Seasonale  Other Topics Concern   Not on file  Social History Narrative   Lives with her adopted  parents   Hearing loss since birth    Social Determinants of Health   Financial Resource Strain:    Difficulty of Paying Living Expenses: Not on file  Food Insecurity:    Worried About Programme researcher, broadcasting/film/videounning Out of Food in the Last Year: Not on file   [employer] Out of Food in the Last Year: Not on file  Transportation Needs:    Lack of Transportation (Medical): Not on file   Lack of Transportation (Non-Medical): Not on file  Physical Activity: Unknown   Days of Exercise per Week: Not on file   Minutes of Exercise per  Session: 20 min  Stress:    Feeling of Stress : Not on file  Social Connections:    Frequency of Communication with Friends and Family: Not on file   Frequency of Social Gatherings with Friends and Family: Not on file   Attends Religious Services: Not on file   Active Member of Clubs or Organizations: Not on file   Attends BankerClub or Organization Meetings: Not on file   Marital Status: Not on file    Allergies:  Allergies  Allergen Reactions   Budesonide-Formoterol Fumarate Anaphylaxis and Other (See Comments)    Other reaction(s): Other (See Comments) chest pain Chest pain chest pain   Astelin [Azelastine]     Tingling, numbness, nausea   Gabapentin Nausea And Vomiting   Nortriptyline Other (See Comments)    Other reaction(s): Other (See  Comments)   Milnacipran Other (See Comments)    Pressure in eyes   Tegaderm Ag Mesh [Silver] Itching   Diclofenac Other (See Comments)    Other reaction(s): Other (See Comments)   Duloxetine Hcl Other (See Comments)    Other reaction(s): Other (See Comments) Other reaction(s): unknown   Montelukast Sodium Other (See Comments)    Other reaction(s): wheezing/sob   Mupirocin Other (See Comments)   Naproxen Other (See Comments) and Rash    Other reaction(s): rash/itching   Naproxen Sodium Rash    mild rash   Other Rash   Oxycodone Hcl Rash   Sulfa Antibiotics Hives, Rash and Itching    Metabolic Disorder Labs: Lab Results  Component Value Date   HGBA1C 4.9 01/29/2018   MPG 94 01/29/2018   MPG 97 10/10/2016   No results found for: PROLACTIN Lab Results  Component Value Date   CHOL 90 10/10/2019   TRIG 131 10/10/2019   HDL 33 (L) 10/10/2019   CHOLHDL 2.7 10/10/2019   VLDL 20 10/10/2016   LDLCALC 36 10/10/2019   LDLCALC 69 09/26/2018   Lab Results  Component Value Date   TSH 1.820 07/15/2020   TSH 2.84 01/29/2018    Therapeutic Level Labs: No results found for: LITHIUM No results found for:  VALPROATE No components found for:  CBMZ  Current Medications: Current Outpatient Medications  Medication Sig Dispense Refill   Acetaminophen-Caff-Pyrilamine (MIDOL COMPLETE PO) Take by mouth.      calcium carbonate (TUMS - DOSED IN MG ELEMENTAL CALCIUM) 500 MG chewable tablet Chew 1 tablet by mouth daily.     Cyanocobalamin (VITAMIN B-12) 500 MCG SUBL Place 1 tablet (500 mcg total) under the tongue once a week. 30 tablet 0   diphenhydrAMINE (BENADRYL) 50 MG tablet Take 50 mg by mouth at bedtime as needed for itching.     FOLIC ACID PO Take by mouth. 800 mcg     metoprolol succinate (TOPROL-XL) 25 MG 24 hr tablet TAKE 3 TABLETS BY MOUTH EVERY DAY 270 tablet 0   norethindrone (AYGESTIN) 5 MG tablet Take 1 tablet (5 mg total) by mouth daily. 30 tablet 2   rosuvastatin (CRESTOR) 20 MG tablet Take 1 tablet (20 mg total) by mouth daily. 90 tablet 1   BOTOX 100 units SOLR injection every 3 (three) months.  (Patient not taking: Reported on 07/22/2020)     vortioxetine HBr (TRINTELLIX) 5 MG TABS tablet Take 1 tablet (5 mg total) by mouth daily with breakfast. 30 tablet 1   No current facility-administered medications for this visit.     Musculoskeletal: Strength & Muscle Tone: UTA Gait & Station: UTA Patient leans: N/A  Psychiatric Specialty Exam: Review of Systems  Constitutional: Positive for fatigue.  Psychiatric/Behavioral: Positive for decreased concentration, dysphoric mood and sleep disturbance. The patient is nervous/anxious.   All other systems reviewed and are negative.   Last menstrual period 07/13/2020.There is no height or weight on file to calculate BMI.  General Appearance: UTA  Eye Contact:  UTA  Speech:  Clear and Coherent  Volume:  Normal  Mood:  Anxious, Depressed and Dysphoric  Affect:  UTA  Thought Process:  Goal Directed and Descriptions of Associations: Intact  Orientation:  Full (Time, Place, and Person)  Thought Content: Logical   Suicidal Thoughts:   No  Homicidal Thoughts:  No  Memory:  Immediate;   Fair Recent;   Fair Remote;   Fair  Judgement:  Fair  Insight:  Fair  Psychomotor Activity:  UTA  Concentration:  Concentration: Fair and Attention Span: Fair  Recall:  [employer] of Knowledge: Fair  Language: Fair  Akathisia:  No  Handed:  Right  AIMS (if indicated): UTA  Assets:  Communication Skills Desire for Improvement Housing Social Support  ADL's:  Intact  Cognition: WNL  Sleep:  Poor   Screenings: GAD-7     Office Visit from 09/25/2018 in [facility] Office Visit from 06/22/2018 in [facility] Office Visit from 03/19/2018 in [facility] Office Visit from 01/29/2018 in [facility]  Total GAD-7 Score 14 7 12 7     PHQ2-9     Office Visit from 04/08/2020 in [facility] Office Visit from 01/28/2020 in [facility] Clinical Support from 10/10/2019 in [facility] Office Visit from 10/01/2019 in [facility] Office Visit from 09/25/2018 in [facility]  PHQ-2 Total Score 0 2 2 2 3   PHQ-9 Total Score 0 9 12 13 17        Assessment and Plan: KELLYANN ORDWAY is a 34 year old Caucasian female who is single, unemployed, lives in [place], has a history of depression, anxiety, multiple medical problems, is deaf and mute, was evaluated by phone with the assistance of video enabled interpreter.  Patient is currently not on any antidepressants and has been noncompliant with psychotherapy sessions.  Patient however wants to get back on medications due to worsening depression and sleep problems.  Plan as noted below.  Plan MDD-unstable Start Trintellix 5 mg p.o. daily. Restart CBT.  She will contact her therapist to schedule the appointment  Social anxiety disorder-unstable Restart CBT  Sleep problems-rule out obstructive sleep apnea-unstable Patient continues to  have fatigue, increased tiredness, feeling of not rested when she wakes up in the morning, some snoring, easily dozing off during the day.  She did have a sleep study done in 2017 which likely was positive for obstructive sleep apnea. She is currently not on CPAP. Epworth sleep scale today-13 Will refer for sleep study She will continue to work on sleep hygiene techniques  GeneSight testing results were reviewed while making medication changes today.  Follow-up in clinic in 4 weeks or sooner if needed.  I have spent atleast 20 minutes non face to face with patient today. More than 50 % of the time was spent for preparing to see the patient ( e.g., review of test, records ), obtaining and to review and separately obtained history , ordering medications and test ,psychoeducation and supportive psychotherapy and care coordination,as well as documenting clinical information in electronic health record. This note was generated in part or whole with voice recognition software. Voice recognition is usually quite accurate but there are transcription errors that can and very often do occur. I apologize for any typographical errors that were not detected and corrected.        [place], MD 07/22/2020, 2:11 PM

## 2020-07-22 NOTE — Patient Instructions (Signed)
Vortioxetine oral tablet What is this medicine? Vortioxetine (vor tee OX e teen) is used to treat depression. This medicine may be used for other purposes; ask your health care provider or pharmacist if you have questions. COMMON BRAND NAME(S): BRINTELLIX, Trintellix What should I tell my health care provider before I take this medicine? They need to know if you have any of these conditions:  bipolar disorder or a family history of bipolar disorder  bleeding disorders  drink alcohol  glaucoma  liver disease  low levels of sodium in the blood  seizures  suicidal thoughts, plans, or attempt; a previous suicide attempt by you or a family member  take medicines that treat or prevent blood clots  an unusual or allergic reaction to vortioxetine, other medicines, foods, dyes, or preservatives  pregnant or trying to get pregnant  breast-feeding How should I use this medicine? Take this medicine by mouth with a glass of water. Follow the directions on the prescription label. You can take it with or without food. If it upsets your stomach, take it with food. Take your medicine at regular intervals. Do not take it more often than directed. Do not stop taking this medicine suddenly except upon the advice of your doctor. Stopping this medicine too quickly may cause serious side effects or your condition may worsen. A special MedGuide will be given to you by the pharmacist with each prescription and refill. Be sure to read this information carefully each time. Talk to your pediatrician regarding the use of this medicine in children. Special care may be needed. Overdosage: If you think you have taken too much of this medicine contact a poison control center or emergency room at once. NOTE: This medicine is only for you. Do not share this medicine with others. What if I miss a dose? If you miss a dose, take it as soon as you can. If it is almost time for your next dose, take only that dose. Do  not take double or extra doses. What may interact with this medicine? Do not take this medicine with any of the following medications:  linezolid  MAOIs like Carbex, Eldepryl, Marplan, Nardil, and Parnate  methylene blue (injected into a vein) This medicine may also interact with the following medications:  alcohol  aspirin and aspirin-like medicines  carbamazepine  certain medicines for depression, anxiety, or psychotic disturbances  certain medicines for migraine headache like almotriptan, eletriptan, frovatriptan, naratriptan, rizatriptan, sumatriptan, zolmitriptan  diuretics  fentanyl  furazolidone  isoniazid  medicines that treat or prevent blood clots like warfarin, enoxaparin, and dalteparin  NSAIDs, medicines for pain and inflammation, like ibuprofen or naproxen  phenytoin  procarbazine  quinidine  rasagiline  rifampin  supplements like St. John's wort, kava kava, valerian  tramadol  tryptophan This list may not describe all possible interactions. Give your health care provider a list of all the medicines, herbs, non-prescription drugs, or dietary supplements you use. Also tell them if you smoke, drink alcohol, or use illegal drugs. Some items may interact with your medicine. What should I watch for while using this medicine? Tell your doctor if your symptoms do not get better or if they get worse. Visit your doctor or health care professional for regular checks on your progress. Because it may take several weeks to see the full effects of this medicine, it is important to continue your treatment as prescribed by your doctor. Patients and their families should watch out for new or worsening thoughts of suicide or   depression. Also watch out for sudden changes in feelings such as feeling anxious, agitated, panicky, irritable, hostile, aggressive, impulsive, severely restless, overly excited and hyperactive, or not being able to sleep. If this happens,  especially at the beginning of treatment or after a change in dose, call your health care professional. You may get drowsy or dizzy. Do not drive, use machinery, or do anything that needs mental alertness until you know how this medicine affects you. Do not stand or sit up quickly, especially if you are an older patient. This reduces the risk of dizzy or fainting spells. Alcohol may interfere with the effect of this medicine. Avoid alcoholic drinks. Your mouth may get dry. Chewing sugarless gum or sucking hard candy, and drinking plenty of water may help. Contact your doctor if the problem does not go away or is severe. What side effects may I notice from receiving this medicine? Side effects that you should report to your doctor or health care professional as soon as possible:  allergic reactions like skin rash, itching or hives, swelling of the face, lips, or tongue  anxious  black, tarry stools  changes in vision  confusion  elevated mood, decreased need for sleep, racing thoughts, impulsive behavior  eye pain  fast, irregular heartbeat  feeling faint or lightheaded, falls  feeling agitated, angry, or irritable  hallucination, loss of contact with reality  loss of balance or coordination  loss of memory  painful or prolonged erections  restlessness, pacing, inability to keep still  seizures  stiff muscles  suicidal thoughts or other mood changes  trouble sleeping  unusual bleeding or bruising  unusually weak or tired  vomiting Side effects that usually do not require medical attention (report to your doctor or health care professional if they continue or are bothersome):  change in appetite or weight  change in sex drive or performance  constipation  dizziness  dry mouth  nausea This list may not describe all possible side effects. Call your doctor for medical advice about side effects. You may report side effects to FDA at 1-800-FDA-1088. Where  should I keep my medicine? Keep out of the reach of children. Store at room temperature between 15 and 30 degrees C (59 and 86 degrees F). Throw away any unused medicine after the expiration date. NOTE: This sheet is a summary. It may not cover all possible information. If you have questions about this medicine, talk to your doctor, pharmacist, or health care provider.  2020 Elsevier/Gold Standard (2016-04-14 16:45:13)  

## 2020-07-22 NOTE — Telephone Encounter (Signed)
axed and confirmed orders for sleep study

## 2020-07-23 DIAGNOSIS — M791 Myalgia, unspecified site: Secondary | ICD-10-CM | POA: Diagnosis not present

## 2020-07-23 DIAGNOSIS — G43719 Chronic migraine without aura, intractable, without status migrainosus: Secondary | ICD-10-CM | POA: Diagnosis not present

## 2020-07-23 DIAGNOSIS — G43109 Migraine with aura, not intractable, without status migrainosus: Secondary | ICD-10-CM | POA: Diagnosis not present

## 2020-07-23 DIAGNOSIS — M542 Cervicalgia: Secondary | ICD-10-CM | POA: Diagnosis not present

## 2020-07-23 DIAGNOSIS — G43111 Migraine with aura, intractable, with status migrainosus: Secondary | ICD-10-CM | POA: Diagnosis not present

## 2020-07-23 DIAGNOSIS — G518 Other disorders of facial nerve: Secondary | ICD-10-CM | POA: Diagnosis not present

## 2020-07-27 ENCOUNTER — Other Ambulatory Visit: Payer: Self-pay

## 2020-07-27 ENCOUNTER — Ambulatory Visit (INDEPENDENT_AMBULATORY_CARE_PROVIDER_SITE_OTHER): Payer: Medicare Other | Admitting: Licensed Clinical Social Worker

## 2020-07-27 DIAGNOSIS — F331 Major depressive disorder, recurrent, moderate: Secondary | ICD-10-CM | POA: Diagnosis not present

## 2020-07-27 DIAGNOSIS — F4010 Social phobia, unspecified: Secondary | ICD-10-CM

## 2020-07-27 NOTE — Progress Notes (Signed)
Virtual Visit via Telephone Note  I connected with [name] on 07/27/20 at 11:00 AM EDT by telephone and verified that I am speaking with the correct person using two identifiers. Pt assisted by phone interpreter.   Location: Patient: home Provider: [facility]   I discussed the limitations, risks, security and privacy concerns of performing an evaluation and management service by telephone and the availability of in person appointments. I also discussed with the patient that there may be a patient responsible charge related to this service. The patient expressed understanding and agreed to proceed.   The patient was advised to call back or seek an in-person evaluation if the symptoms worsen or if the condition fails to improve as anticipated.  I provided 60 minutes of non-face-to-face time during this encounter.   Nichole Neyer R Laquonda Welby, LCSW    THERAPIST PROGRESS NOTE  Session Time: 11:00-12:00 pm  Participation Level: Active  Behavioral Response: NAAlertAnxious  Type of Therapy: Individual Therapy  Treatment Goals addressed: Anxiety  Interventions: CBT  Summary: Yesenia Wood is a 34 y.o. female who presents with symptoms related to her diagnosis (depression, social anxiety).  Allowed pt to explore and express thoughts and feelings associated with current work environment, family, engaging socially throughout pandemic, and concerns about pts overall health and well being.   Pt is aware that she has some physical limitations that will not allow her to engage in as much physical activity as she would like. Pt feeling more worries; racing thoughts--encouraged mindfulness to help slow thoughts down and encouraged grounding exercises to help with panic attacks.  Pt excited about visiting grandmother for her 89th birthday in 2 wks.  Suicidal/Homicidal: No  Therapist Response: Today session was pts first session with LCSW counselor after previous counselor left practice.   Reviewed coping skills (panic attacks, anxiety, social anxiety) and relaxation strategies.   Plan: Return again in 3 weeks. Ongoing treatment plan to include managing social anxiety, panic attacks, stress, and mood.   Diagnosis: Axis I: Major Depression, Recurrent severe and Social Anxiety    Axis II: No diagnosis    Ernest Haber Raevon Broom, LCSW 07/27/2020

## 2020-07-28 NOTE — Progress Notes (Signed)
Office Visit    Patient Name: Yesenia Wood Date of Encounter: 07/29/2020  Primary Care Provider:  Alba Cory, MD Primary Cardiologist:  Lorine Bears, MD Electrophysiologist:  None   Chief Complaint    Yesenia Wood is a 34 y.o. female with a hx of inappropriate sinus tachycardia, moderate asthma, fibromyalgia, deafness s/p cochlear implant with failure, B12 deficiency, obesity, depression presents today for tachycardia.   Past Medical History    Past Medical History:  Diagnosis Date  . ADHD (attention deficit hyperactivity disorder)   . B12 deficiency   . Bell's palsy    right sided  . Chronic migraine   . Deaf   . Depression   . 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 Fatigue   . Fatty liver   . Fibromyalgia   . GERD (gastroesophageal reflux disease)   . Headache   . High triglycerides   . Hyperglycemia   . Hypertension   . Hyperthyroidism   . Moderate asthma   . Obesity   . Paresthesia   . Premature birth    Past Surgical History:  Procedure Laterality Date  . BARTHOLIN GLAND CYST REMOVAL    . COCHLEAR IMPLANT    . COCHLEAR IMPLANT REMOVAL    . LAPAROSCOPIC TUBAL LIGATION Bilateral 03/26/2018   Procedure: LAPAROSCOPIC TUBAL LIGATION;  Surgeon: Hildred Laser, MD;  Location: [facility] ORS;  Service: Gynecology;  Laterality: Bilateral;  with peritoneal biopsies  . LAPAROSCOPY    . MYRINGOTOMY WITH TUBE PLACEMENT    . TUBAL LIGATION  03/26/2018    Allergies  Allergies  Allergen Reactions  . Budesonide-Formoterol Fumarate Anaphylaxis and Other (See Comments)    Other reaction(s): Other (See Comments) chest pain Chest pain chest pain  . Astelin [Azelastine]     Tingling, numbness, nausea  . Gabapentin Nausea And Vomiting  . Nortriptyline Other (See Comments)    Other reaction(s): Other (See Comments)  . Milnacipran Other (See Comments)    Pressure in eyes  . Tegaderm Ag Mesh [Silver] Itching  .  Diclofenac Other (See Comments)    Other reaction(s): Other (See Comments)  . Duloxetine Hcl Other (See Comments)    Other reaction(s): Other (See Comments) Other reaction(s): unknown  . Montelukast Sodium Other (See Comments)    Other reaction(s): wheezing/sob  . Mupirocin Other (See Comments)  . Naproxen Other (See Comments) and Rash    Other reaction(s): rash/itching  . Naproxen Sodium Rash    mild rash  . Other Rash  . Oxycodone Hcl Rash  . Sulfa Antibiotics Hives, Rash and Itching    History of Present Illness    Yesenia Wood is a 34 y.o. female with a hx of  inappropriate sinus tachycardia, moderate asthma, fibromyalgia, deafness s/p cochlear implant with failure, B12 deficiency, obesity, depression. She was last seen 01/14/20 by Dr. Kirke Corin.  Prior echocardiogram 09/2014 was normal. Treadmill stress testing 05/2015 normal however HR and BP elevated at peak exercise. 48 hour Holter showed ST. Initially treated with Diltiazem but didn't respond well and subsequently transitioned to Toprol.  She was evaluated for exertional chest pain in 2020 with coronary CTA 09/2019 showing calcium score of 0. Echo 11/2019 with normal LVEF and no significant valvular abnormalities.   Visit completed today with assistance of an interpreter.  She notices continued tachycardia and reports he might be noticing more palpitations than previous.  Reports no recurrent chest pain, pressure, tightness.  Reports her shortness  of breath is stable at her baseline.  She is very concerned about weight gain and tells me this affects her mental health.  She is very interested in coming off the beta-blocker as she attributes some of her weight gain to this.  She was previously intolerant to diltiazem.  She monitors her blood pressure at home and her systolic blood pressures routinely 100s-110s without lightheadedness, dizziness, near syncope.  She endorses eating a heart healthy diet and does try to stay active.  Over the  pandemic she picked up a new hobby of making books.  EKGs/Labs/Other Studies Reviewed:   The following studies were reviewed today:  EKG:  EKG is ordered today.  The ekg ordered today demonstrates SR 71 bpm with no acute ST/T wave changes.   Recent Labs: 09/24/2019: BUN 16; Creatinine, Ser 0.78; Potassium 4.5; Sodium 138 07/15/2020: Hemoglobin 14.4; Platelets 259; TSH 1.820  Recent Lipid Panel    Component Value Date/Time   CHOL 90 10/10/2019 0000   CHOL 192 10/29/2015 1050   TRIG 131 10/10/2019 0000   HDL 33 (L) 10/10/2019 0000   HDL 43 10/29/2015 1050   CHOLHDL 2.7 10/10/2019 0000   VLDL 20 10/10/2016 1235   LDLCALC 36 10/10/2019 0000    Home Medications   Current Meds  Medication Sig  . Acetaminophen-Caff-Pyrilamine (MIDOL COMPLETE PO) Take by mouth.   Marland Kitchen BOTOX 100 units SOLR injection every 3 (three) months.   . calcium carbonate (TUMS - DOSED IN MG ELEMENTAL CALCIUM) 500 MG chewable tablet Chew 1 tablet by mouth daily.  . Cyanocobalamin (VITAMIN B-12) 500 MCG SUBL Place 1 tablet (500 mcg total) under the tongue once a week.  . diphenhydrAMINE (BENADRYL) 50 MG tablet Take 50 mg by mouth at bedtime as needed for itching.  Marland Kitchen FOLIC ACID PO Take by mouth. 800 mcg  . norethindrone (AYGESTIN) 5 MG tablet Take 1 tablet (5 mg total) by mouth daily.  . rosuvastatin (CRESTOR) 20 MG tablet Take 1 tablet (20 mg total) by mouth daily.  Marland Kitchen vortioxetine HBr (TRINTELLIX) 5 MG TABS tablet Take 1 tablet (5 mg total) by mouth daily with breakfast.  . [DISCONTINUED] metoprolol succinate (TOPROL-XL) 25 MG 24 hr tablet TAKE 3 TABLETS BY MOUTH EVERY DAY    Review of Systems       Review of Systems  Constitutional: Positive for weight gain. Negative for chills, fever and malaise/fatigue.  Cardiovascular: Positive for palpitations. Negative for chest pain, dyspnea on exertion, leg swelling, near-syncope, orthopnea and syncope.  Respiratory: Negative for cough, shortness of breath and wheezing.     Gastrointestinal: Negative for nausea and vomiting.  Neurological: Negative for dizziness, light-headedness and weakness.   All other systems reviewed and are otherwise negative except as noted above.  Physical Exam    VS:  BP 110/78 (BP Location: Left Arm, Patient Position: Sitting, Cuff Size: Normal)   Pulse 71   Ht 5\' 3"  (1.6 m)   Wt 183 lb (83 kg)   LMP 07/13/2020   SpO2 98%   BMI 32.42 kg/m  , BMI Body mass index is 32.42 kg/m. GEN: Well nourished, well developed, in no acute distress. HEENT: normal. Neck: Supple, no JVD, carotid bruits, or masses. Cardiac: RRR, no murmurs, rubs, or gallops. No clubbing, cyanosis, edema.  Radials/DP/PT 2+ and equal bilaterally.  Respiratory:  Respirations regular and unlabored, clear to auscultation bilaterally. GI: Soft, nontender, nondistended MS: No deformity or atrophy. Skin: Warm and dry, no rash. Neuro:  Strength and sensation are  intact. Psych: Normal affect.  Assessment & Plan    1. Inappropriate sinus tachycardia/Palpitations - Notes continued palpitations and sensation of tachycardia.  Reports this is more frequent than previous.  She request to be off beta-blocker as she attributes some of her weight gain to this. Her weight is up at 2lbs since clinic visit 6 months ago. We discussed difficulty with transition to CCB as she has low normal BP and did not previously tolerate Diltiazem. Stop Toprol 75mg  daily, start Acebutolol 200mg  BID.   2. HLD - Continue Rosuvastatin 20mg  daily.   Disposition: MyChart message in 1-2 weeks to check in on medication changes. Follow up in 6 month(s) with Dr. or APP.    , NP 07/29/2020, 6:16 PM

## 2020-07-29 ENCOUNTER — Encounter: Payer: Self-pay | Admitting: Family

## 2020-07-29 ENCOUNTER — Ambulatory Visit (INDEPENDENT_AMBULATORY_CARE_PROVIDER_SITE_OTHER): Payer: Medicare Other | Admitting: Family

## 2020-07-29 ENCOUNTER — Other Ambulatory Visit: Payer: Self-pay

## 2020-07-29 VITALS — BP 110/78 | HR 71 | Ht 63.0 in | Wt 183.0 lb

## 2020-07-29 DIAGNOSIS — I10 Essential (primary) hypertension: Secondary | ICD-10-CM

## 2020-07-29 DIAGNOSIS — R002 Palpitations: Secondary | ICD-10-CM | POA: Diagnosis not present

## 2020-07-29 DIAGNOSIS — R000 Tachycardia, unspecified: Secondary | ICD-10-CM | POA: Diagnosis not present

## 2020-07-29 MED ORDER — ACEBUTOLOL HCL 200 MG PO CAPS: 200.0000 mg | ORAL_CAPSULE | Freq: Two times a day (BID) | ORAL | 2 refills | Status: DC

## 2020-07-29 NOTE — Patient Instructions (Signed)
Medication Instructions:  Your physician has recommended you make the following change in your medication:   STOP Metoprolol  START Acebutolol 200mg  twice daily  *If you need a refill on your cardiac medications before your next appointment, please call your pharmacy*   Lab Work: None ordered today.   Testing/Procedures: Your EKG today shows normal sinus rhythm.   Follow-Up: At [facility], you and your health needs are our priority.  As part of our continuing mission to provide you with exceptional heart care, we have created designated Provider Care Teams.  These Care Teams include your primary Cardiologist (physician) and Advanced Practice Providers (APPs -  Physician Assistants and Nurse Practitioners) who all work together to provide you with the care you need, when you need it.  We recommend signing up for the patient portal called "MyChart".  Sign up information is provided on this After Visit Summary.  MyChart is used to connect with patients for Virtual Visits (Telemedicine).  Patients are able to view lab/test results, encounter notes, upcoming appointments, etc.  Non-urgent messages can be sent to your provider as well.   To learn more about what you can do with MyChart, go to [facility].    Your next appointment:   6 month(s)  The format for your next appointment:   In Person  Provider:   You may see [email or web address], MD or one of the following Advanced Practice Providers on your designated Care Team:   Lorine Bears, NP  Nicolasa Ducking, PA-C  Eula Listen, NP  Gillian Shields, PA-C  Other Instructions  Marisue Ivan, NP will send you a MyChart message in a couple of weeks to check in on how you feel on the new medication.

## 2020-08-12 ENCOUNTER — Encounter: Payer: Self-pay | Admitting: Family

## 2020-08-20 ENCOUNTER — Other Ambulatory Visit: Payer: Self-pay

## 2020-08-20 ENCOUNTER — Ambulatory Visit (INDEPENDENT_AMBULATORY_CARE_PROVIDER_SITE_OTHER): Payer: Medicare Other | Admitting: Licensed Clinical Social Worker

## 2020-08-20 DIAGNOSIS — F4010 Social phobia, unspecified: Secondary | ICD-10-CM | POA: Diagnosis not present

## 2020-08-20 DIAGNOSIS — F331 Major depressive disorder, recurrent, moderate: Secondary | ICD-10-CM | POA: Diagnosis not present

## 2020-08-20 NOTE — Progress Notes (Signed)
Virtual Visit via Telephone Note  I connected with Yesenia Wood on 08/20/20 at 11:00 AM EDT by telephone and verified that I am speaking with the correct person using two identifiers.  Location: Patient: home Provider: [facility]   I discussed the limitations, risks, security and privacy concerns of performing an evaluation and management service by telephone and the availability of in person appointments. I also discussed with the patient that there may be a patient responsible charge related to this service. The patient expressed understanding and agreed to proceed.   The patient was advised to call back or seek an in-person evaluation if the symptoms worsen or if the condition fails to improve as anticipated.  I provided 58 minutes of non-face-to-face time during this encounter.   Nishita Isaacks R Harel Repetto, LCSW   THERAPIST PROGRESS NOTE  Session Time: 11:00-11:58a  Participation Level: Active  Behavioral Response: NAAlertAnxious  Type of Therapy: Individual Therapy  Treatment Goals addressed: Anxiety and Coping  Interventions: CBT and Supportive  Summary: Yesenia Wood is a 34 y.o. female who presents with improving symptoms related to her diagnosis (depression, social anxiety).  Pt reports that she has been compliant with medication, and eating patterns are WNL. Pt reports good quantity of sleep with frequent waking.   Allowed pt to explore and express thoughts and feelings associated with day-to-day living and relationships. Pt reports that she is still having panic attacks, but fewer than before. Pt identifies panic triggers as being outside. Discussed several outdoor scenarios that pt often gets triggered in, and discussed some ways that pt could make some changes to increase overall feelings of safety.   Pt reports feeling sad after visiting grandmother in [place] some cognitive decline. Grandmother recently transitioned to a SNF and it not happy about having to  leave her home.  Reviewed positive-feeling triggering behaviors, self care, and life balance. Pt wants to engage more socially with others but is unaware of where to start. Encouraged to seek out online support groups/groups. Will follow up with pt next appt.   Suicidal/Homicidal: No  SI, HI, or AVH reported at time of session.  Therapist Response: Yesenia Wood reports a decreased amount of panic attacks and overall fewer symptoms of anxiety than last session, which is indicative of progress.  Plan: Return again in 3 weeks.The ongoing treatment plan includes maintaining current levels of progress and continuing to build skills to manage mood, improve stress/anxiety management, emotion regulation, distress tolerance, and behavior modification.   Diagnosis: Axis I: Major depressive disorder, recurrent, moderate; Social anxiety disorder    Axis II: No diagnosis    Yesenia Haber [name], LCSW 08/20/2020

## 2020-09-02 ENCOUNTER — Telehealth (INDEPENDENT_AMBULATORY_CARE_PROVIDER_SITE_OTHER): Payer: Medicare Other | Admitting: Psychiatry

## 2020-09-02 ENCOUNTER — Encounter: Payer: Self-pay | Admitting: Psychiatry

## 2020-09-02 ENCOUNTER — Other Ambulatory Visit: Payer: Self-pay

## 2020-09-02 DIAGNOSIS — F4010 Social phobia, unspecified: Secondary | ICD-10-CM

## 2020-09-02 DIAGNOSIS — F5105 Insomnia due to other mental disorder: Secondary | ICD-10-CM | POA: Diagnosis not present

## 2020-09-02 DIAGNOSIS — F331 Major depressive disorder, recurrent, moderate: Secondary | ICD-10-CM

## 2020-09-02 MED ORDER — LAMOTRIGINE 25 MG PO TABS: 25.0000 mg | ORAL_TABLET | Freq: Every day | ORAL | 1 refills | Status: DC

## 2020-09-02 NOTE — Progress Notes (Signed)
Provider Location : [facility] Patient Location : Home  Participants: Patient , ASL interpreter ,Provider  Virtual Visit via Telephone Note  I connected with Yesenia Wood on 09/02/20 at  2:30 PM EDT by telephone and verified that I am speaking with the correct person using two identifiers.   I discussed the limitations, risks, security and privacy concerns of performing an evaluation and management service by telephone and the availability of in person appointments. I also discussed with the patient that there may be a patient responsible charge related to this service. The patient expressed understanding and agreed to proceed.     I discussed the assessment and treatment plan with the patient. The patient was provided an opportunity to ask questions and all were answered. The patient agreed with the plan and demonstrated an understanding of the instructions.   The patient was advised to call back or seek an in-person evaluation if the symptoms worsen or if the condition fails to improve as anticipated.  BH MD OP Progress Note  09/02/2020 5:47 PM DELSIE AMADOR  MRN:  [id number]  Chief Complaint:  Chief Complaint    Follow-up     HPI: Yesenia Wood is a 34 year old Caucasian female, unemployed, single, lives in [place], has a history of depression, social anxiety, insomnia, migraine headaches, vitamin B12 deficiency, bilateral hearing loss, tachycardia, is mute, was evaluated by phone today using video enabled ASL interpreter.  Patient today reports she had to stop taking the Trintellix.  She took it for 4 weeks however her heart palpitation got worse while she stayed on it.  She hence stopped taking it.  She had a follow-up with cardiology and is currently on a new medication called acebutolol.  She reports that has helped.  Patient reports she continues to struggle with anxiety, sadness, crying spells.  She has tried and failed multiple SSRIs and SNRIs.  Discussed starting Lamictal.   Patient is agreeable.  Patient denies any suicidality, homicidality or perceptual disturbances.  She continues to follow-up with her therapist and reports she does not know if therapy sessions are beneficial since these are done by phone.  Patient denies any other concerns today.  Visit Diagnosis:    ICD-10-CM   1. MDD (major depressive disorder), recurrent episode, moderate (HCC)  F33.1 lamoTRIgine (LAMICTAL) 25 MG tablet  2. Social anxiety disorder  F40.10   3. Insomnia due to mental condition  F51.05     Past Psychiatric History: I have reviewed past psychiatric history from my progress note on 09/10/2018.  Past trials of Zoloft, Cymbalta, Pamelor, Effexor, gabapentin, Abilify, Belsomra, hydroxyzine, trazodone, Remeron, Sonata, Wellbutrin, Requip, Viibryd, Trintellix.  Past Medical History:  Past Medical History:  Diagnosis Date  . ADHD (attention deficit hyperactivity disorder)   . B12 deficiency   . Bell's palsy    right sided  . Chronic migraine   . Deaf   . Depression   . 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 Fatigue   . Fatty liver   . Fibromyalgia   . GERD (gastroesophageal reflux disease)   . Headache   . High triglycerides   . Hyperglycemia   . Hypertension   . Hyperthyroidism   . Moderate asthma   . Obesity   . Paresthesia   . Premature birth     Past Surgical History:  Procedure Laterality Date  . BARTHOLIN GLAND CYST REMOVAL    . COCHLEAR IMPLANT    . COCHLEAR  IMPLANT REMOVAL    . LAPAROSCOPIC TUBAL LIGATION Bilateral 03/26/2018   Procedure: LAPAROSCOPIC TUBAL LIGATION;  Surgeon: Hildred Laserherry, Anika, MD;  Location: [facility] ORS;  Service: Gynecology;  Laterality: Bilateral;  with peritoneal biopsies  . LAPAROSCOPY    . MYRINGOTOMY WITH TUBE PLACEMENT    . TUBAL LIGATION  03/26/2018    Family Psychiatric History: I have reviewed family psychiatric history from my progress note on 09/10/2018  Family  History:  Family History  Adopted: Yes  Family history unknown: Yes    Social History: I have reviewed social history from my progress note on 09/10/2018 Social History   Socioeconomic History  . Marital status: Single    Spouse name: Not on file  . Number of children: 0  . Years of education: Not on file  . Highest education level: Some college, no degree  Occupational History  . Occupation: disability for hearing loss  Tobacco Use  . Smoking status: Former Smoker    Packs/day: 1.00    Years: 2.00    Pack years: 2.00    Types: Cigarettes    Start date: 11/28/2004    Quit date: 11/28/2006    Years since quitting: 13.7  . Smokeless tobacco: Never Used  Vaping Use  . Vaping Use: Never used  Substance and Sexual Activity  . Alcohol use: Yes    Alcohol/week: 0.0 standard drinks    Comment: occass  . Drug use: No  . Sexual activity: Not Currently    Birth control/protection: Pill    Comment: Seasonale  Other Topics Concern  . Not on file  Social History Narrative   Lives with her adopted  parents   Hearing loss since birth    Social Determinants of Health   Financial Resource Strain:   . Difficulty of Paying Living Expenses: Not on file  Food Insecurity:   . Worried About Programme researcher, broadcasting/film/videounning Out of Food in the Last Year: Not on file  . Ran Out of Food in the Last Year: Not on file  Transportation Needs:   . Lack of Transportation (Medical): Not on file  . Lack of Transportation (Non-Medical): Not on file  Physical Activity: Unknown  . Days of Exercise per Week: Not on file  . Minutes of Exercise per Session: 20 min  Stress:   . Feeling of Stress : Not on file  Social Connections:   . Frequency of Communication with Friends and Family: Not on file  . Frequency of Social Gatherings with Friends and Family: Not on file  . Attends Religious Services: Not on file  . Active Member of Clubs or Organizations: Not on file  . Attends BankerClub or Organization Meetings: Not on file  .  Marital Status: Not on file    Allergies:  Allergies  Allergen Reactions  . Budesonide-Formoterol Fumarate Anaphylaxis and Other (See Comments)    Other reaction(s): Other (See Comments) chest pain Chest pain chest pain  . Astelin [Azelastine]     Tingling, numbness, nausea  . Gabapentin Nausea And Vomiting  . Nortriptyline Other (See Comments)    Other reaction(s): Other (See Comments)  . Milnacipran Other (See Comments)    Pressure in eyes  . Tegaderm Ag Mesh [Silver] Itching  . Diclofenac Other (See Comments)    Other reaction(s): Other (See Comments)  . Duloxetine Hcl Other (See Comments)    Other reaction(s): Other (See Comments) Other reaction(s): unknown  . Montelukast Sodium Other (See Comments)    Other reaction(s): wheezing/sob  . Mupirocin Other (  See Comments)  . Naproxen Other (See Comments) and Rash    Other reaction(s): rash/itching  . Naproxen Sodium Rash    mild rash  . Other Rash  . Oxycodone Hcl Rash  . Sulfa Antibiotics Hives, Rash and Itching    Metabolic Disorder Labs: Lab Results  Component Value Date   HGBA1C 4.9 01/29/2018   MPG 94 01/29/2018   MPG 97 10/10/2016   No results found for: PROLACTIN Lab Results  Component Value Date   CHOL 90 10/10/2019   TRIG 131 10/10/2019   HDL 33 (L) 10/10/2019   CHOLHDL 2.7 10/10/2019   VLDL 20 10/10/2016   LDLCALC 36 10/10/2019   LDLCALC 69 09/26/2018   Lab Results  Component Value Date   TSH 1.820 07/15/2020   TSH 2.84 01/29/2018    Therapeutic Level Labs: No results found for: LITHIUM No results found for: VALPROATE No components found for:  CBMZ  Current Medications: Current Outpatient Medications  Medication Sig Dispense Refill  . acebutolol (SECTRAL) 200 MG capsule Take 1 capsule (200 mg total) by mouth 2 (two) times daily. 60 capsule 2  . Acetaminophen-Caff-Pyrilamine (MIDOL COMPLETE PO) Take by mouth.     Marland Kitchen BOTOX 100 units SOLR injection every 3 (three) months.     . calcium  carbonate (TUMS - DOSED IN MG ELEMENTAL CALCIUM) 500 MG chewable tablet Chew 1 tablet by mouth daily.    . Cyanocobalamin (VITAMIN B-12) 500 MCG SUBL Place 1 tablet (500 mcg total) under the tongue once a week. 30 tablet 0  . diphenhydrAMINE (BENADRYL) 50 MG tablet Take 50 mg by mouth at bedtime as needed for itching.    Marland Kitchen FOLIC ACID PO Take by mouth. 800 mcg    . lamoTRIgine (LAMICTAL) 25 MG tablet Take 1 tablet (25 mg total) by mouth daily. 30 tablet 1  . norethindrone (AYGESTIN) 5 MG tablet Take 1 tablet (5 mg total) by mouth daily. 30 tablet 2  . rosuvastatin (CRESTOR) 20 MG tablet Take 1 tablet (20 mg total) by mouth daily. 90 tablet 1   No current facility-administered medications for this visit.     Musculoskeletal: Strength & Muscle Tone: UTA Gait & Station: UTA Patient leans: N/A  Psychiatric Specialty Exam: Review of Systems  Psychiatric/Behavioral: Positive for dysphoric mood. The patient is nervous/anxious.   All other systems reviewed and are negative.   There were no vitals taken for this visit.There is no height or weight on file to calculate BMI.  General Appearance: UTA  Eye Contact:  UTA  Speech:  mute  Volume:  mute  Mood:  Anxious and Depressed  Affect:  UTA  Thought Process:  Goal Directed and Descriptions of Associations: Intact  Orientation:  Full (Time, Place, and Person)  Thought Content: Logical   Suicidal Thoughts:  No  Homicidal Thoughts:  No  Memory:  Immediate;   Fair Recent;   Fair Remote;   Fair  Judgement:  Fair  Insight:  Fair  Psychomotor Activity:  UTA  Concentration:  Concentration: Fair and Attention Span: Fair  Recall:  [employer] of Knowledge: Fair  Language: Fair  Akathisia:  No  Handed:  Right  AIMS (if indicated): UTA  Assets:  Housing Social Support  ADL's:  Intact  Cognition: WNL  Sleep:  Fair   Screenings: GAD-7     Counselor from 07/27/2020 in [facility] Office Visit from 09/25/2018 in  [facility] Office Visit from 06/22/2018 in [facility] Office Visit from 03/19/2018 in [facility] Office Visit from 01/29/2018 in [facility]  Total GAD-7 Score 14 14 7 12 7     PHQ2-9     Counselor from 07/27/2020 in [facility] Office Visit from 04/08/2020 in [facility] Office Visit from 01/28/2020 in [facility] Clinical Support from 10/10/2019 in [facility] Office Visit from 10/01/2019 in [facility]  PHQ-2 Total Score 5 0 2 2 2   PHQ-9 Total Score 15 0 9 12 13        Assessment and Plan: ANNALEE MEYERHOFF is a 34 year old Caucasian female who is single, unemployed, lives in [place], has a history of depression, anxiety, multiple medical problems, is deaf and mute was evaluated by phone.  Patient is currently noncompliant with Trintellix since she reported heart palpitations while on it.  Patient will benefit from the following plan.  Plan MDD-unstable Discontinue Trintellix. Start Lamictal 25 mg p.o. daily Continue CBT.   Social anxiety disorder-unstable Continue CBT  Sleep problems-rule out obstructive sleep apnea-unstable Patient was referred for sleep study.  Pending.  [employer] testing results were reviewed while making medication changes today.  Follow-up in clinic in 4 weeks or sooner if needed.  I have spent atleast 20 minutes non face to face with patient today. More than 50 % of the time was spent for preparing to see the patient ( e.g., review of test, records ),  ordering medications and test ,psychoeducation and supportive psychotherapy and care coordination,as well as documenting clinical information in electronic health record. This note was generated in part or whole with voice recognition software. Voice recognition is usually quite accurate but there are transcription errors that can and  very often do occur. I apologize for any typographical errors that were not detected and corrected.       Yesenia Shoe, MD 09/02/2020, 5:47 PM

## 2020-09-02 NOTE — Patient Instructions (Signed)
Lamotrigine tablets What is this medicine? LAMOTRIGINE (la MOE tri jeen) is used to control seizures in adults and children with epilepsy and Lennox-Gastaut syndrome. It is also used in adults to treat bipolar disorder. This medicine may be used for other purposes; ask your health care provider or pharmacist if you have questions. COMMON BRAND NAME(S): Lamictal, Subvenite What should I tell my health care provider before I take this medicine? They need to know if you have any of these conditions:  aseptic meningitis during prior use of lamotrigine  depression  folate deficiency  kidney disease  liver disease  suicidal thoughts, plans, or attempt; a previous suicide attempt by you or a family member  an unusual or allergic reaction to lamotrigine or other seizure medications, other medicines, foods, dyes, or preservatives  pregnant or trying to get pregnant  breast-feeding How should I use this medicine? Take this medicine by mouth with a glass of water. Follow the directions on the prescription label. Do not chew these tablets. If this medicine upsets your stomach, take it with food or milk. Take your doses at regular intervals. Do not take your medicine more often than directed. A special MedGuide will be given to you by the pharmacist with each new prescription and refill. Be sure to read this information carefully each time. Talk to your pediatrician regarding the use of this medicine in children. While this drug may be prescribed for children as young as 2 years for selected conditions, precautions do apply. Overdosage: If you think you have taken too much of this medicine contact a poison control center or emergency room at once. NOTE: This medicine is only for you. Do not share this medicine with others. What if I miss a dose? If you miss a dose, take it as soon as you can. If it is almost time for your next dose, take only that dose. Do not take double or extra doses. What may  interact with this medicine?  atazanavir  carbamazepine  female hormones, including contraceptive or birth control pills  lopinavir  methotrexate  phenobarbital  phenytoin  primidone  pyrimethamine  rifampin  ritonavir  trimethoprim  valproic acid This list may not describe all possible interactions. Give your health care provider a list of all the medicines, herbs, non-prescription drugs, or dietary supplements you use. Also tell them if you smoke, drink alcohol, or use illegal drugs. Some items may interact with your medicine. What should I watch for while using this medicine? Visit your doctor or health care provider for regular checks on your progress. If you take this medicine for seizures, wear a Medic Alert bracelet or necklace. Carry an identification card with information about your condition, medicines, and doctor or health care provider. It is important to take this medicine exactly as directed. When first starting treatment, your dose will need to be adjusted slowly. It may take weeks or months before your dose is stable. You should contact your doctor or health care provider if your seizures get worse or if you have any new types of seizures. Do not stop taking this medicine unless instructed by your doctor or health care provider. Stopping your medicine suddenly can increase your seizures or their severity. This medicine may cause serious skin reactions. They can happen weeks to months after starting the medicine. Contact your health care provider right away if you notice fevers or flu-like symptoms with a rash. The rash may be red or purple and then turn into blisters or peeling   of the skin. Or, you might notice a red rash with swelling of the face, lips or lymph nodes in your neck or under your arms. You may get drowsy, dizzy, or have blurred vision. Do not drive, use machinery, or do anything that needs mental alertness until you know how this medicine affects you. To  reduce dizzy or fainting spells, do not sit or stand up quickly, especially if you are an older patient. Alcohol can increase drowsiness and dizziness. Avoid alcoholic drinks. If you are taking this medicine for bipolar disorder, it is important to report any changes in your mood to your doctor or health care provider. If your condition gets worse, you get mentally depressed, feel very hyperactive or manic, have difficulty sleeping, or have thoughts of hurting yourself or committing suicide, you need to get help from your health care provider right away. If you are a caregiver for someone taking this medicine for bipolar disorder, you should also report these behavioral changes right away. The use of this medicine may increase the chance of suicidal thoughts or actions. Pay special attention to how you are responding while on this medicine. Your mouth may get dry. Chewing sugarless gum or sucking hard candy, and drinking plenty of water may help. Contact your doctor if the problem does not go away or is severe. Women who become pregnant while using this medicine may enroll in the [place] Antiepileptic Drug Pregnancy Registry by calling 1-888-233-2334. This registry collects information about the safety of antiepileptic drug use during pregnancy. This medicine may cause a decrease in folic acid. You should make sure that you get enough folic acid while you are taking this medicine. Discuss the foods you eat and the vitamins you take with your health care provider. What side effects may I notice from receiving this medicine? Side effects that you should report to your doctor or health care professional as soon as possible:  allergic reactions like skin rash, itching or hives, swelling of the face, lips, or tongue  changes in vision  depressed mood  elevated mood, decreased need for sleep, racing thoughts, impulsive behavior  loss of balance or coordination  mouth sores  rash, fever, and  swollen lymph nodes  redness, blistering, peeling or loosening of the skin, including inside the mouth  right upper belly pain  seizures  severe muscle pain  signs and symptoms of aseptic meningitis such as stiff neck and sensitivity to light, headache, drowsiness, fever, nausea, vomiting, rash  signs of infection - fever or chills, cough, sore throat, pain or difficulty passing urine  suicidal thoughts or other mood changes  swollen lymph nodes  trouble walking  unusual bruising or bleeding  unusually weak or tired  yellowing of the eyes or skin Side effects that usually do not require medical attention (report to your doctor or health care professional if they continue or are bothersome):  diarrhea  dizziness  dry mouth  stuffy nose  tiredness  tremors  trouble sleeping This list may not describe all possible side effects. Call your doctor for medical advice about side effects. You may report side effects to FDA at 1-800-FDA-1088. Where should I keep my medicine? Keep out of reach of children. Store at room temperature between 15 and 30 degrees C (59 and 86 degrees F). Throw away any unused medicine after the expiration date. NOTE: This sheet is a summary. It may not cover all possible information. If you have questions about this medicine, talk to your doctor,   pharmacist, or health care provider.  2020 Elsevier/Gold Standard (2019-02-15 15:03:40)  

## 2020-09-03 DIAGNOSIS — G43109 Migraine with aura, not intractable, without status migrainosus: Secondary | ICD-10-CM | POA: Diagnosis not present

## 2020-09-03 DIAGNOSIS — M791 Myalgia, unspecified site: Secondary | ICD-10-CM | POA: Diagnosis not present

## 2020-09-03 DIAGNOSIS — G518 Other disorders of facial nerve: Secondary | ICD-10-CM | POA: Diagnosis not present

## 2020-09-03 DIAGNOSIS — G43111 Migraine with aura, intractable, with status migrainosus: Secondary | ICD-10-CM | POA: Diagnosis not present

## 2020-09-03 DIAGNOSIS — M542 Cervicalgia: Secondary | ICD-10-CM | POA: Diagnosis not present

## 2020-09-03 DIAGNOSIS — G43719 Chronic migraine without aura, intractable, without status migrainosus: Secondary | ICD-10-CM | POA: Diagnosis not present

## 2020-09-08 ENCOUNTER — Other Ambulatory Visit: Payer: Self-pay

## 2020-09-08 ENCOUNTER — Ambulatory Visit (INDEPENDENT_AMBULATORY_CARE_PROVIDER_SITE_OTHER): Payer: Medicare Other

## 2020-09-08 DIAGNOSIS — Z23 Encounter for immunization: Secondary | ICD-10-CM

## 2020-09-10 ENCOUNTER — Other Ambulatory Visit: Payer: Self-pay

## 2020-09-10 ENCOUNTER — Ambulatory Visit (INDEPENDENT_AMBULATORY_CARE_PROVIDER_SITE_OTHER): Payer: Medicare Other | Admitting: Licensed Clinical Social Worker

## 2020-09-10 DIAGNOSIS — F331 Major depressive disorder, recurrent, moderate: Secondary | ICD-10-CM | POA: Diagnosis not present

## 2020-09-10 DIAGNOSIS — F4010 Social phobia, unspecified: Secondary | ICD-10-CM | POA: Diagnosis not present
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
## 2020-09-12 ENCOUNTER — Encounter: Payer: Self-pay | Admitting: Family

## 2020-09-15 ENCOUNTER — Telehealth: Payer: Self-pay

## 2020-09-15 NOTE — Telephone Encounter (Signed)
Returned call to patient.  Discussed with patient to stop the Lamictal since it is making her irritable and aggressive.  She also reports self-injurious thoughts when she felt like scratching herself.  She currently denies any suicidality, denies any intent or plan.  She agrees to ask for help and go to the nearest emergency department if her symptoms get worse.

## 2020-09-15 NOTE — Telephone Encounter (Signed)
pt called states that she is going to stop the lamictal she states she irritable and breaking things  and she having self harming thought but does not have a plan she is trying to work threw it.  she like to speak with you about her concerns.   Pt was advised to stop medication and to go to ER if she plans on self harming.

## 2020-09-18 ENCOUNTER — Other Ambulatory Visit: Payer: Self-pay

## 2020-09-18 ENCOUNTER — Ambulatory Visit (INDEPENDENT_AMBULATORY_CARE_PROVIDER_SITE_OTHER): Payer: Medicare Other | Admitting: Licensed Clinical Social Worker

## 2020-09-18 DIAGNOSIS — F4010 Social phobia, unspecified: Secondary | ICD-10-CM

## 2020-09-18 DIAGNOSIS — F331 Major depressive disorder, recurrent, moderate: Secondary | ICD-10-CM | POA: Diagnosis not present

## 2020-09-18 NOTE — Progress Notes (Signed)
Virtual Visit via Video Note  I connected with Yesenia Wood on 09/18/20 at 10:00 AM EDT by a video enabled telemedicine application and verified that I am speaking with the correct person using two identifiers.  Video connection was interrupted when less than 50% of the duration of the visit was complete, at which time the remainder of the visit was completed via audio only.   Location: Patient: home Provider: remote office [place], [place])   I discussed the limitations of evaluation and management by telemedicine and the availability of in person appointments. The patient expressed understanding and agreed to proceed.   The patient was advised to call back or seek an in-person evaluation if the symptoms worsen or if the condition fails to improve as anticipated.  I provided 45 minutes of non-face-to-face time during this encounter.   Yesenia Moro R Thermon Zulauf, LCSW   THERAPIST PROGRESS NOTE  Session Time: 10:00-10:45a  Participation Level: Active  Behavioral Response: Neat and Well GroomedAlertAnxious  Type of Therapy: Individual Therapy  Treatment Goals addressed: Anxiety and Coping  Interventions: CBT, Solution Focused and Supportive  Summary: Yesenia Wood is a 34 y.o. female who presents with improving symptoms related to diagnoses of depression and anxiety. Pt reports that mood is stabilizing and that she is managing anxiety/stress better.   Allowed pt to explore and express thoughts and feelings associated with recent emotional episode--which pt feels was triggered by a medication change.  Allowed pt to express the overall psychological impact of the experience.   Discussed current ways of coping and encouraged pt to try to fit more physical activity into her daily plan. Brainstormed with patient ways of engaging in activities that are less stressful or won't trigger anxiety.Discussed having someone accompany pt outside so that she can walk and get more physical activity  (typically an anxiety-triggering episode).   Encouraged self care and engaging in activities with loved ones as a "helper" and to help with her own social engagement.    Suicidal/Homicidal: No  SI, HI, or AVH reported at time of session. Pt reports recent episode that triggered suicidal/self-harm type thoughts.  Therapist Response: Yesenia Wood's recent emotional crisis seems better contained at the time of session. Pt reports improvements with overall self awareness and managing mood and anxiety. These developments are reflective of progress.  Plan: Return again in 2 weeks. The ongoing treatment plan includes maintaining current levels of progress and continuing to build skills to manage mood, improve stress/anxiety management, emotion regulation, distress tolerance, and behavior modification.   Diagnosis: Axis I: Major depressive disorder, recurrent, moderate; social anxiety disorder   Axis II: No diagnosis  Yesenia Wood Yesenia Adelstein, LCSW 09/18/2020

## 2020-09-27 ENCOUNTER — Other Ambulatory Visit: Payer: Self-pay | Admitting: Psychiatry

## 2020-09-27 DIAGNOSIS — F331 Major depressive disorder, recurrent, moderate: Secondary | ICD-10-CM

## 2020-10-02 ENCOUNTER — Ambulatory Visit (INDEPENDENT_AMBULATORY_CARE_PROVIDER_SITE_OTHER): Payer: Medicare Other | Admitting: Licensed Clinical Social Worker

## 2020-10-02 ENCOUNTER — Other Ambulatory Visit: Payer: Self-pay

## 2020-10-02 DIAGNOSIS — F4010 Social phobia, unspecified: Secondary | ICD-10-CM | POA: Diagnosis not present

## 2020-10-02 DIAGNOSIS — F331 Major depressive disorder, recurrent, moderate: Secondary | ICD-10-CM | POA: Diagnosis not present

## 2020-10-02 NOTE — Progress Notes (Signed)
Virtual Visit via Video Note  I connected with Yesenia Wood on 10/02/20 at 11:00 AM EDT by a video enabled telemedicine application and verified that I am speaking with the correct person using two identifiers.   Location: Patient: home Provider: remote office [place], [place])   I discussed the limitations of evaluation and management by telemedicine and the availability of in person appointments. The patient expressed understanding and agreed to proceed.   I discussed the assessment and treatment plan with the patient. The patient was provided an opportunity to ask questions and all were answered. The patient agreed with the plan and demonstrated an understanding of the instructions.   The patient was advised to call back or seek an in-person evaluation if the symptoms worsen or if the condition fails to improve as anticipated.  I provided 60 minutes of non-face-to-face time during this encounter.   Jaskarn Schweer R Malaki Koury, LCSW    THERAPIST PROGRESS NOTE  Session Time: 11:00a-12:00p  Participation Level: Active  Behavioral Response: Neat and Well GroomedAlertAnxious  Type of Therapy: Individual Therapy  Treatment Goals addressed: Coping  Interventions: Supportive  Summary: Yesenia Wood is a 34 y.o. female who presents with improving symptoms related to depression/anxiety diagnoses. Pt reports that mood has been stable and reports fewer panic attack episodes. Pt reports poor sleep quality and quantity. Pt currently is not taking any medication to help manage mood, anxiety, or insomnia.   Discussed pts current levels of anxiety, and things that make pt feel better. Discussed benefit of mindfulness episodes throughout day--encouraged 2 daily sessions. Discussed benefit of increasing physical activity to help manage stress/anxiety. Pt currently trying to get 6000 steps in per day.   Completed  GAD7: 15 PHQ9- 15  Allowed pt to explore health-related concerns and overall  psychological impact. Pt weighing out the pros and cons of having ovaries removed due to GYN issues. Discussed how the GYN issues are impacting current life in a negative way. Discussed chronic migraines and overall impact.   Explored current sleep quality/quantity. Pt reports that she will go to bed "when it gets dark" and will sleep for 1-2 hours on and off until around midnight and will "sleep through until morning".  Discussed upcoming time change and pt states that she will go to bed around 6pm when it gets darker in the wintertime. Pt does not feel this needs to be changed.   Discussed pts continuing fear of covid--pt is fearful to do elective surgery because of covid and still not going out in public because of covid-related fears.   Reviewed CBT-based interventions to help relieve stress and anxiety: mindfulness, meditation, positive activities, physical activity. Encouraged positive social engagement (pt not getting a lot since covid).   Suicidal/Homicidal: No  Therapist Response: Yesenia Wood continues to make good progress with overall mood regulation and anxiety management. Pt reports a decrease in overall panic episodes, which is reflective of progress.   Plan: Return again in 3 weeks. The ongoing treatment plan includes maintaining current levels of progress and continuing to build skills to manage mood, improve stress/anxiety management, emotion regulation, distress tolerance, and behavior modification.   Diagnosis: Axis I: Major depressive disorder, recurrent, moderate; social anxiety disorder    Axis II: No diagnosis    Yesenia Haber Loie Jahr, LCSW 10/02/2020

## 2020-10-05 ENCOUNTER — Telehealth (INDEPENDENT_AMBULATORY_CARE_PROVIDER_SITE_OTHER): Payer: Medicare Other | Admitting: Psychiatry

## 2020-10-05 ENCOUNTER — Encounter: Payer: Self-pay | Admitting: Psychiatry

## 2020-10-05 ENCOUNTER — Other Ambulatory Visit: Payer: Self-pay

## 2020-10-05 ENCOUNTER — Other Ambulatory Visit: Payer: Self-pay | Admitting: Obstetrics and Gynecology

## 2020-10-05 DIAGNOSIS — F331 Major depressive disorder, recurrent, moderate: Secondary | ICD-10-CM | POA: Diagnosis not present

## 2020-10-05 DIAGNOSIS — F5105 Insomnia due to other mental disorder: Secondary | ICD-10-CM

## 2020-10-05 DIAGNOSIS — F4010 Social phobia, unspecified: Secondary | ICD-10-CM

## 2020-10-05 NOTE — Progress Notes (Signed)
Virtual Visit via Video Note  I connected with Yesenia Wood on 10/05/20 at  1:30 PM EST by a video enabled telemedicine application and verified that I am speaking with the correct person using two identifiers.  Location Provider Location : [facility] Patient Location : Home  Participants: Patient ,ASL Interpreter Provider    I discussed the limitations of evaluation and management by telemedicine and the availability of in person appointments. The patient expressed understanding and agreed to proceed.     I discussed the assessment and treatment plan with the patient. The patient was provided an opportunity to ask questions and all were answered. The patient agreed with the plan and demonstrated an understanding of the instructions.   The patient was advised to call back or seek an in-person evaluation if the symptoms worsen or if the condition fails to improve as anticipated.  Video connection was lost at less than 50% of the duration of the visit, at which time the remainder of the visit was completed through audio only using video enabled ASL interpreter.    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  Patient today reports since stopping the Lamictal she feels better.  She reports she felt her mood symptoms got worse while she was on the Lamictal.  She also had other side effects of self-injurious behaviors like scratching herself.  She was hence advised to stop the Lamictal few weeks ago.  Patient today reports her depressive symptoms as improved.  She also reports anxiety symptoms as improved.  Patient denies any  suicidality, homicidality or perceptual disturbances.  She reports sleep may be getting better.  She however continues to struggle with fatigue during the day, easily dozes off.  She reports she canceled her sleep study however is willing to give him a call back to reschedule it.  She denies any other concerns today.  Visit Diagnosis:    ICD-10-CM   1. MDD (major depressive disorder), recurrent episode, moderate (HCC)  F33.1   2. Social anxiety disorder  F40.10   3. Insomnia due to mental condition  F51.05     Past Psychiatric History: I have reviewed past psychiatric history from my progress note on 09/10/2018.  Past trials of Zoloft, Cymbalta, Pamelor, Effexor, gabapentin, Abilify, Belsomra, hydroxyzine, trazodone, Remeron, Lamictal, Sonata, Wellbutrin, Requip, Viibryd, Trintellix  Past Medical History:  Past Medical History:  Diagnosis Date  . ADHD (attention deficit hyperactivity disorder)   . B12 deficiency   . Bell's palsy    right sided  . Chronic migraine   . Deaf   . Depression   . 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 Fatigue   . Fatty liver   . Fibromyalgia   . GERD (gastroesophageal reflux disease)   . Headache   . High triglycerides   . Hyperglycemia   . Hypertension   . Hyperthyroidism   . Moderate asthma   . Obesity   . Paresthesia   . Premature birth     Past Surgical History:  Procedure Laterality Date  . BARTHOLIN GLAND CYST REMOVAL    . COCHLEAR  IMPLANT    . COCHLEAR IMPLANT REMOVAL    . LAPAROSCOPIC TUBAL LIGATION Bilateral 03/26/2018   Procedure: LAPAROSCOPIC TUBAL LIGATION;  Surgeon: Hildred Laser, MD;  Location: [facility] OR[facility];  Service: Gynecology;  Laterality: Bilateral;  with peritoneal biopsies  . LAPAROSCOPY    . MYRINGOTOMY WITH TUBE PLACEMENT    . TUBAL LIGATION  03/26/2018    Family Psychiatric History: I have reviewed family psychiatric history from my progress note on  09/10/2018.  Family History:  Family History  Adopted: Yes  Family history unknown: Yes    Social History: I have reviewed social history from my progress note on 09/10/2018. Social History   Socioeconomic History  . Marital status: Single    Spouse name: Not on file  . Number of children: 0  . Years of education: Not on file  . Highest education level: Some college, no degree  Occupational History  . Occupation: disability for hearing loss  Tobacco Use  . Smoking status: Former Smoker    Packs/day: 1.00    Years: 2.00    Pack years: 2.00    Types: Cigarettes    Start date: 11/28/2004    Quit date: 11/28/2006    Years since quitting: 13.8  . Smokeless tobacco: Never Used  Vaping Use  . Vaping Use: Never used  Substance and Sexual Activity  . Alcohol use: Yes    Alcohol/week: 0.0 standard drinks    Comment: occass  . Drug use: No  . Sexual activity: Not Currently    Birth control/protection: Pill    Comment: Seasonale  Other Topics Concern  . Not on file  Social History Narrative   Lives with her adopted  parents   Hearing loss since birth    Social Determinants of Health   Financial Resource Strain:   . Difficulty of Paying Living Expenses: Not on file  Food Insecurity:   . Worried About Programme researcher, broadcasting/film/video in the Last Year: Not on file  . Ran Out of Food in the Last Year: Not on file  Transportation Needs:   . Lack of Transportation (Medical): Not on file  . Lack of Transportation (Non-Medical): Not on file  Physical Activity: Unknown  . Days of Exercise per Week: Not on file  . Minutes of Exercise per Session: 20 min  Stress:   . Feeling of Stress : Not on file  Social Connections:   . Frequency of Communication with Friends and Family: Not on file  . Frequency of Social Gatherings with Friends and Family: Not on file  . Attends Religious Services: Not on file  . Active Member of Clubs or Organizations: Not on file  . Attends Banker  Meetings: Not on file  . Marital Status: Not on file    Allergies:  Allergies  Allergen Reactions  . Budesonide-Formoterol Fumarate Anaphylaxis and Other (See Comments)    Other reaction(s): Other (See Comments) chest pain Chest pain chest pain  . Astelin [Azelastine]     Tingling, numbness, nausea  . Gabapentin Nausea And Vomiting  . Nortriptyline Other (See Comments)    Other reaction(s): Other (See Comments)  . Lamictal [Lamotrigine]     Self injurious thoughts  . Milnacipran Other (See Comments)    Pressure in eyes  . Tegaderm Ag Mesh [Silver] Itching  . Diclofenac Other (See Comments)    Other reaction(s): Other (See Comments)  . Duloxetine Hcl Other (See Comments)    Other reaction(s): Other (See Comments) Other reaction(s): unknown  .  Montelukast Sodium Other (See Comments)    Other reaction(s): wheezing/sob  . Mupirocin Other (See Comments)  . Naproxen Other (See Comments) and Rash    Other reaction(s): rash/itching  . Naproxen Sodium Rash    mild rash  . Other Rash  . Oxycodone Hcl Rash  . Sulfa Antibiotics Hives, Rash and Itching    Metabolic Disorder Labs: Lab Results  Component Value Date   HGBA1C 4.9 01/29/2018   MPG 94 01/29/2018   MPG 97 10/10/2016   No results found for: PROLACTIN Lab Results  Component Value Date   CHOL 90 10/10/2019   TRIG 131 10/10/2019   HDL 33 (L) 10/10/2019   CHOLHDL 2.7 10/10/2019   VLDL 20 10/10/2016   LDLCALC 36 10/10/2019   LDLCALC 69 09/26/2018   Lab Results  Component Value Date   TSH 1.820 07/15/2020   TSH 2.84 01/29/2018    Therapeutic Level Labs: No results found for: LITHIUM No results found for: VALPROATE No components found for:  CBMZ  Current Medications: Current Outpatient Medications  Medication Sig Dispense Refill  . acebutolol (SECTRAL) 200 MG capsule Take 1 capsule (200 mg total) by mouth 2 (two) times daily. 60 capsule 2  . Acetaminophen-Caff-Pyrilamine (MIDOL COMPLETE PO) Take by mouth.      Marland Kitchen BOTOX 100 units SOLR injection every 3 (three) months.     . calcium carbonate (TUMS - DOSED IN MG ELEMENTAL CALCIUM) 500 MG chewable tablet Chew 1 tablet by mouth daily.    . Cyanocobalamin (VITAMIN B-12) 500 MCG SUBL Place 1 tablet (500 mcg total) under the tongue once a week. 30 tablet 0  . diphenhydrAMINE (BENADRYL) 50 MG tablet Take 50 mg by mouth at bedtime as needed for itching.    Marland Kitchen FOLIC ACID PO Take by mouth. 800 mcg    . norethindrone (AYGESTIN) 5 MG tablet Take 1 tablet (5 mg total) by mouth daily. 30 tablet 2  . rosuvastatin (CRESTOR) 20 MG tablet Take 1 tablet (20 mg total) by mouth daily. 90 tablet 1   No current facility-administered medications for this visit.     Musculoskeletal: Strength & Muscle Tone: UTA Gait & Station: UTA Patient leans: N/A  Psychiatric Specialty Exam: Review of Systems  Constitutional: Positive for fatigue.  Psychiatric/Behavioral: Positive for dysphoric mood. Negative for agitation, behavioral problems, confusion, decreased concentration, self-injury, sleep disturbance and suicidal ideas. The patient is not nervous/anxious.   All other systems reviewed and are negative.   There were no vitals taken for this visit.There is no height or weight on file to calculate BMI.  General Appearance: Casual  Eye Contact:  Fair  Speech:  Clear and Coherent  Volume:  Normal  Mood:  Anxious improving  Affect:  Congruent  Thought Process:  Goal Directed and Descriptions of Associations: Intact  Orientation:  Full (Time, Place, and Person)  Thought Content: Logical   Suicidal Thoughts:  No  Homicidal Thoughts:  No  Memory:  Immediate;   Fair Recent;   Fair Remote;   Fair  Judgement:  Fair  Insight:  Fair  Psychomotor Activity:  Normal  Concentration:  Concentration: Fair and Attention Span: Fair  Recall:  [employer] of Knowledge: Fair  Language: Fair  Akathisia:  No  Handed:  Right  AIMS (if indicated): UTA  Assets:  Communication  Skills Desire for Improvement Social Support  ADL's:  Intact  Cognition: WNL  Sleep:  Fair   Screenings: GAD-7     Counselor from 10/02/2020 in  [facility] Counselor from 07/27/2020 in [facility] Office Visit from 09/25/2018 in [facility] Office Visit from 06/22/2018 in [facility] Office Visit from 03/19/2018 in [facility]  Total GAD-7 Score 15 14 14 7 12     PHQ2-9     Counselor from 10/02/2020 in [facility] Counselor from 07/27/2020 in [facility] Office Visit from 04/08/2020 in [facility] Office Visit from 01/28/2020 in [facility] Clinical Support from 10/10/2019 in [facility]  PHQ-2 Total Score 4 5 0 2 2  PHQ-9 Total Score 15 15 0 9 12       Assessment and Plan: CEDAR ROSEMAN is a 34 year old Caucasian female who is single, unemployed, lives in [place], has a history of depression, anxiety, multiple medical problems, is deaf and mute was evaluated by telemedicine today.  Patient continues to have side effects to medication trials however today reports her mood symptoms as improving.  She however will benefit from continued CBT as well as a sleep study.  Plan MDD-improving Continue CBT due to adverse side effects to multiple medication trials.  Social anxiety disorder-improving Continue CBT  Sleep problems-rule out obstructive sleep apnea-unstable Patient was referred for sleep study-pending. Patient agrees to get it done.  Follow-up in clinic as needed.  I have spent atleast 15 minutes face to face by video with patient today. More than 50 % of the time was spent for preparing to see the patient ( e.g., review of test, records ), ordering medications and test ,psychoeducation and supportive psychotherapy and care coordination,as well as  documenting clinical information in electronic health record. This note was generated in part or whole with voice recognition software. Voice recognition is usually quite accurate but there are transcription errors that can and very often do occur. I apologize for any typographical errors that were not detected and corrected.        [place], MD 10/05/2020, 1:58 PM

## 2020-10-06 NOTE — Progress Notes (Signed)
Name: Yesenia Wood   MRN: [id number]    DOB: 10-03-1986   Date:10/09/2020       Progress Note  Subjective  Chief Complaint  Follow up   Hearing impaired Interpreter present   HPI  Headaches and paresthesias: seeing Dr. Malvin Johns, on her last visit at his office 06/2020 he recommended her to see neurologist at [facility], she has an appointment at the end of Dec. He also advised her to take Abilify 2 mg and titrate up to 4 mg as tolerated , she states she has been taking but is not working. She is having an episode of headache at this time. She takes Tylenol prn She states nothing seems to really work, she cannot tolerate Imitrex because of palpitation   Palpitation: sees Dr. Kirke Corin, her last visit at his office was with his NP Phineas Douglas. She had Echo 11/2019 that showed normal LVEF, CTA 09/2019 calcium score of zero. She had a 48 hour Holter that showed ST and was given Diltiazem but unable to tolerate it, she has been on Metoprolol and was doing well for a while but on her last visit 07/2020 she asked to stop metoprolol and switch to something else to see if it would help her lose weight. She has been on Acebutolol and she states seems to have less side effects. She has some dizziness occasionally.   Obesity: she has tried a low carb diet but is gaining weight, she has not responded to weight watchers. Discussed calorie counting or intermittent fasting. She will try to download the app Myfitnesspal. Her weight fluctuates, from 150's to 180's. Weight has been stable now   FMS: she hurts all over, worse when cold and with weather changes, she could not tolerate multiple therapies but unable to tolerate it, she has mental fogginess, fatigue is still present but stable  Dyslipidemia: doing well on Rosuvastatin, LDL is at goal, HDL low, discussed ways to increase good cholesterol , unchanged   MDD: sees Dr. Elna Breslow and supposed to see therapist but unable to see them because she needs video calls and  her phone is not working . phq 9 was normal today. She is compliant with visits   Moderate asthma: she was seen by pulmonologist, does not have medications at home, she states none of the medications helped She continues to have intermittent sob, no wheezing,she states since she stopped metoprolol she feels like symptoms a little better   DUB: seen by Dr. Valentino Saxon this Summer, had coagulation profile that was negative Normal TSH. She was placed on Aygestin , however she states it is not working, she is still bleeding, she has a follow up next week with Dr. Valentino Saxon   Patient Active Problem List   Diagnosis Date Noted  . Sleep difficulties 04/30/2020  . Medication nonadherence due to intolerance 12/31/2019  . Numbness 12/26/2019  . Tingling 12/26/2019  . RLS (restless legs syndrome) 09/30/2019  . MDD (major depressive disorder), recurrent episode, moderate (HCC) 07/17/2019  . Social anxiety disorder 07/17/2019  . 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 OSA (obstructive sleep apnea) 10/10/2016  . 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  . Dissocial personality disorder (HCC) 06/07/2016  . Sleep disturbance 05/10/2016  . Chronic migraine without aura 03/29/2016  . Pain in the chest 06/09/2015  . Palpitations 06/09/2015  . Deaf 05/16/2015  . B12 deficiency 05/16/2015  . Cochlear implant status 05/16/2015  . Fatty infiltration of liver 05/16/2015  . Gastro-esophageal reflux disease without esophagitis 05/16/2015  . Generalized  headache 05/16/2015  . Right-sided Bell's palsy 05/16/2015  . H/O: depression 05/16/2015  . Hypertriglyceridemia 05/16/2015  . Irregular menstrual cycle 05/16/2015  . Overweight 05/16/2015  . Paresthesia 05/16/2015  . Disorder of labyrinth 05/16/2015  . Dyspnea 10/07/2014  . Moderate persistent intrinsic asthma without status asthmaticus without complication 10/07/2014  . Migraine 08/05/2013  . Allergic rhinitis 07/01/2013  . Obesity (BMI 30-39.9) 07/01/2013  . Bartholin  gland cyst 09/16/2008    Past Surgical History:  Procedure Laterality Date  . BARTHOLIN GLAND CYST REMOVAL    . COCHLEAR IMPLANT    . COCHLEAR IMPLANT REMOVAL    . LAPAROSCOPIC TUBAL LIGATION Bilateral 03/26/2018   Procedure: LAPAROSCOPIC TUBAL LIGATION;  Surgeon: Hildred Laser, MD;  Location: [facility] ORS;  Service: Gynecology;  Laterality: Bilateral;  with peritoneal biopsies  . LAPAROSCOPY    . MYRINGOTOMY WITH TUBE PLACEMENT    . TUBAL LIGATION  03/26/2018    Family History  Adopted: Yes  Family history unknown: Yes    Social History   Tobacco Use  . Smoking status: Former Smoker    Packs/day: 1.00    Years: 2.00    Pack years: 2.00    Types: Cigarettes    Start date: 11/28/2004    Quit date: 11/28/2006    Years since quitting: 13.8  . Smokeless tobacco: Never Used  Substance Use Topics  . Alcohol use: Yes    Alcohol/week: 0.0 standard drinks    Comment: occass     Current Outpatient Medications:  .  acebutolol (SECTRAL) 200 MG capsule, Take 1 capsule (200 mg total) by mouth 2 (two) times daily., Disp: 60 capsule, Rfl: 2 .  Acetaminophen-Caff-Pyrilamine (MIDOL COMPLETE PO), Take by mouth. , Disp: , Rfl:  .  BOTOX 100 units SOLR injection, every 3 (three) months. , Disp: , Rfl:  .  calcium carbonate (TUMS - DOSED IN MG ELEMENTAL CALCIUM) 500 MG chewable tablet, Chew 1 tablet by mouth daily., Disp: , Rfl:  .  Cyanocobalamin (VITAMIN B-12) 500 MCG SUBL, Place 1 tablet (500 mcg total) under the tongue once a week., Disp: 30 tablet, Rfl: 0 .  diphenhydrAMINE (BENADRYL) 50 MG tablet, Take 50 mg by mouth at bedtime as needed for itching., Disp: , Rfl:  .  FOLIC ACID PO, Take by mouth. 800 mcg, Disp: , Rfl:  .  norethindrone (AYGESTIN) 5 MG tablet, TAKE 1 TABLET BY MOUTH EVERY DAY, Disp: 90 tablet, Rfl: 0 .  rosuvastatin (CRESTOR) 20 MG tablet, Take 1 tablet (20 mg total) by mouth daily., Disp: 90 tablet, Rfl: 1  Allergies  Allergen Reactions  . Budesonide-Formoterol Fumarate  Anaphylaxis and Other (See Comments)    Other reaction(s): Other (See Comments) chest pain Chest pain chest pain  . Astelin [Azelastine]     Tingling, numbness, nausea  . Gabapentin Nausea And Vomiting  . Nortriptyline Other (See Comments)    Other reaction(s): Other (See Comments)  . Lamictal [Lamotrigine]     Self injurious thoughts  . Milnacipran Other (See Comments)    Pressure in eyes  . Tegaderm Ag Mesh [Silver] Itching  . Diclofenac Other (See Comments)    Other reaction(s): Other (See Comments)  . Duloxetine Hcl Other (See Comments)    Other reaction(s): Other (See Comments) Other reaction(s): unknown  . Montelukast Sodium Other (See Comments)    Other reaction(s): wheezing/sob  . Mupirocin Other (See Comments)  . Naproxen Other (See Comments) and Rash    Other reaction(s): rash/itching  . Naproxen Sodium Rash  mild rash  . Other Rash  . Oxycodone Hcl Rash  . Sulfa Antibiotics Hives, Rash and Itching    I personally reviewed active problem list, medication list, allergies, family history, social history, health maintenance with the patient/caregiver today.   ROS   Constitutional: Negative for fever or weight change.  Respiratory: Negative for cough and shortness of breath.   Cardiovascular: Negative for chest pain , positive for occasional  palpitations.  Gastrointestinal: Negative for abdominal pain, no bowel changes.  Musculoskeletal: Negative for gait problem or joint swelling.  Skin: Negative for rash.  Neurological: Negative for dizziness , positive for  headache.  No other specific complaints in a complete review of systems (except as listed in HPI above).  Objective  Vitals:   10/09/20 1037  BP: 122/76  Pulse: 80  Resp: 16  Temp: 98.6 F (37 C)  TempSrc: Oral  SpO2: 99%  Weight: 183 lb 9.6 oz (83.3 kg)  Height: 5\' 3"  (1.6 m)    Body mass index is 32.52 kg/m.  Physical Exam  Constitutional: Patient appears well-developed and  well-nourished. Obese  No distress.  HEENT: head atraumatic, normocephalic, pupils equal and reactive to light,  neck supple Cardiovascular: Normal rate, regular rhythm and normal heart sounds.  No murmur heard. No BLE edema. Pulmonary/Chest: Effort normal and breath sounds normal. No respiratory distress. Abdominal: Soft.  There is no tenderness. Muscular Skeletal: normal rom hip but pain on right outer hip with internal rotation  Psychiatric: Patient has a normal mood and affect. behavior is normal. Judgment and thought content normal.  Recent Results (from the past 2160 hour(s))  POCT urinalysis dipstick     Status: Abnormal   Collection Time: 07/15/20  2:53 PM  Result Value Ref Range   Color, UA yellow    Clarity, UA clear    Glucose, UA Negative Negative   Bilirubin, UA neg    Ketones, UA neg    Spec Grav, UA >=1.030 (A) 1.010 - 1.025   Blood, UA large +3    pH, UA 6.0 5.0 - 8.0   Protein, UA Negative Negative   Urobilinogen, UA 0.2 0.2 or 1.0 E.U./dL   Nitrite, UA neg    Leukocytes, UA Negative Negative   Appearance yellow;clear    Odor    TSH     Status: None   Collection Time: 07/15/20  3:00 PM  Result Value Ref Range   TSH 1.820 0.450 - 4.500 uIU/mL  Von Willebrand panel     Status: None   Collection Time: 07/15/20  3:00 PM  Result Value Ref Range   Factor VIII Activity 103 56 - 140 %   Von Willebrand Ag 72 50 - 200 %   Von Willebrand Factor 58 50 - 200 %  INR/PT     Status: None   Collection Time: 07/15/20  3:00 PM  Result Value Ref Range   INR 1.0 0.9 - 1.2    Comment: Reference interval is for non-anticoagulated patients. Suggested INR therapeutic range for Vitamin K antagonist therapy:    Standard Dose (moderate intensity                   therapeutic range):       2.0 - 3.0    Higher intensity therapeutic range       2.5 - 3.5    Prothrombin Time 10.0 9.1 - 12.0 sec  PTT     Status: None   Collection Time: 07/15/20  3:00 PM  Result Value Ref Range    aPTT 29 24 - 33 sec    Comment: This test has not been validated for monitoring unfractionated heparin therapy. aPTT-based therapeutic ranges for unfractionated heparin therapy have not been established. For general guidelines on Heparin monitoring, refer to the [employer] Directory of Services.   CBC     Status: None   Collection Time: 07/15/20  3:00 PM  Result Value Ref Range   WBC 6.3 3.4 - 10.8 x10E3/uL   RBC 4.78 3.77 - 5.28 x10E6/uL   Hemoglobin 14.4 11.1 - 15.9 g/dL   Hematocrit 60.442.3 54.034.0 - 46.6 %   MCV 89 79 - 97 fL   MCH 30.1 26.6 - 33.0 pg   MCHC 34.0 31 - 35 g/dL   RDW 98.113.6 19.111.7 - 47.815.4 %   Platelets 259 150 - 450 x10E3/uL  Coag Studies Interp Report     Status: None   Collection Time: 07/15/20  3:00 PM  Result Value Ref Range   Interpretation Note     Comment: ------------------------------- COAGULATION: VON WILLEBRAND FACTOR ASSESSMENT CURRENT RESULTS ASSESSMENT The VWF:Ag is normal. The VWF:RCo is normal. The FVIII is normal. VON WILLEBRAND FACTOR ASSESSMENT CURRENT RESULTS INTERPRETATION - These results are not consistent with a diagnosis of VWD according to the current NHLBI guideline. VON WILLEBRAND FACTOR ASSESSMENT - Results may be falsely elevated and possibly falsely normal as VWF and FVIII may increase in pregnancy, in samples drawn from patients (particularly children) who are visibly stressed at the time of phlebotomy, as acute phase reactants, or in response to certain drug therapies such as desmopressin. Repeat testing may be necessary before excluding a diagnosis of VWD especially if the clinical suspicion is high for an underlying bleeding disorder. The setting for phlebotomy should be as calm as possible and patients should be encouraged to sit quietly prior to the blood draw. VON WILLEBRAND FACTOR ASS ESSMENT DEFINITIONS - VWD - von Willebrand disease; VWF - von Willebrand factor; VWF:Ag - VWF antigen; VWF:RCo - VWF ristocetin  cofactor activity; FVIII - factor VIII activity. MEDICAL DIRECTOR: For questions regarding panel interpretation, please contact Lebron ConnersBrian Poirier, M.D. at [facility] at [phone]. ------------------------------- DISCLAIMER These assessments and interpretations are provided as a convenience in support of the physician-patient relationship and are not intended to replace the physician's clinical judgment. They are derived from national guidelines in addition to other evidence and expert opinion. The clinician should consider this information within the context of clinical opinion and the individual patient. SEE GUIDANCE FOR VON WILLEBRAND FACTOR ASSESSMENT: (1) The National Heart, Lung and Blood Institute. The Diagnosis, Evaluation and Management of von Willebrand Disease. Lafe GarinBethesda, MD: [employer] Public ation [phone]. 2007. Available at http://kemp.com/http://www.nhlbi.nih.gov/guidelines/vwd/. (2) Annie SableNichols WL et al. Othella BoyerAm J Hematol. 2009; 84(6):366-370. (3) Laffan M et al. Haemophilia. 2004;10(3):199-217. (4) Pasi KJ et al. Haemophilia. 2004; 10(3):218-231.      PHQ2/9: Depression screen [facility]HQ 2/9 10/09/2020 04/08/2020 01/28/2020 10/10/2019 10/01/2019  Decreased Interest 1 0 1 1 1   Down, Depressed, Hopeless 1 0 1 1 1   PHQ - 2 Score 2 0 2 2 2   Altered sleeping 3 0 2 3 3   Tired, decreased energy 3 0 2 3 3   Change in appetite 1 0 1 1 1   Feeling bad or failure about yourself  0 0 0 1 1  Trouble concentrating 1 0 1 1 1   Moving slowly or fidgety/restless 1 0 1 1 2   Suicidal thoughts 0 0 0 0 0  PHQ-9  Score 11 0 9 12 13   Difficult doing work/chores Somewhat difficult Not difficult at all Somewhat difficult Somewhat difficult Somewhat difficult  Some encounter information is confidential and restricted. Go to Review Flowsheets activity to see all data.  Some recent data might be hidden    phq 9 is negative   Fall Risk: Fall Risk  10/09/2020 04/08/2020 01/28/2020  10/10/2019 10/01/2019  Falls in the past year? 1 0 1 1 0  Number falls in past yr: - 0 1 1 0  Comment - - [place] - -  Injury with Fall? 0 0 1 0 0  Comment - - ankle - -  Risk Factor Category  - - - - -  Follow up - Falls evaluation completed - Falls prevention discussed -      Assessment & Plan  1. Mild persistent asthma without complication   2. Fibromyalgia syndrome   3. Dyslipidemia  - Lipid panel - rosuvastatin (CRESTOR) 20 MG tablet; Take 1 tablet (20 mg total) by mouth daily.  Dispense: 90 tablet; Refill: 1  4. Palpitation  - COMPLETE METABOLIC PANEL WITH GFR  5. Need for hepatitis C screening test  - Hepatitis C antibody  6. Chronic migraine without aura without status migrainosus, not intractable  - Ubrogepant (UBRELVY) 100 MG TABS; Take 1 tablet by mouth daily as needed.  Dispense: 9 tablet; Refill: 1  7. Hypercalcemia  - COMPLETE METABOLIC PANEL WITH GFR  8. B12 deficiency  - Vitamin B12  9. MDD (major depressive disorder), recurrent episode, moderate (HCC)  Keep follow up with Dr. 13/01/2019   10. Cramp in lower leg  Advised to use a roller

## 2020-10-09 ENCOUNTER — Ambulatory Visit (INDEPENDENT_AMBULATORY_CARE_PROVIDER_SITE_OTHER): Payer: Medicare Other | Admitting: Family Medicine

## 2020-10-09 ENCOUNTER — Encounter: Payer: Self-pay | Admitting: Family Medicine

## 2020-10-09 ENCOUNTER — Other Ambulatory Visit: Payer: Self-pay

## 2020-10-09 VITALS — BP 122/76 | HR 80 | Temp 98.6°F | Resp 16 | Ht 63.0 in | Wt 183.6 lb

## 2020-10-09 DIAGNOSIS — M797 Fibromyalgia: Secondary | ICD-10-CM

## 2020-10-09 DIAGNOSIS — R002 Palpitations: Secondary | ICD-10-CM

## 2020-10-09 DIAGNOSIS — G43709 Chronic migraine without aura, not intractable, without status migrainosus: Secondary | ICD-10-CM

## 2020-10-09 DIAGNOSIS — J4530 Mild persistent asthma, uncomplicated: Secondary | ICD-10-CM

## 2020-10-09 DIAGNOSIS — R252 Cramp and spasm: Secondary | ICD-10-CM

## 2020-10-09 DIAGNOSIS — E538 Deficiency of other specified B group vitamins: Secondary | ICD-10-CM

## 2020-10-09 DIAGNOSIS — Z1159 Encounter for screening for other viral diseases: Secondary | ICD-10-CM | POA: Diagnosis not present

## 2020-10-09 DIAGNOSIS — E785 Hyperlipidemia, unspecified: Secondary | ICD-10-CM

## 2020-10-09 DIAGNOSIS — F331 Major depressive disorder, recurrent, moderate: Secondary | ICD-10-CM

## 2020-10-09 MED ORDER — ROSUVASTATIN CALCIUM 20 MG PO TABS: 20.0000 mg | ORAL_TABLET | Freq: Every day | ORAL | 1 refills | Status: DC

## 2020-10-09 MED ORDER — UBRELVY 100 MG PO TABS: 1.0000 | ORAL_TABLET | Freq: Every day | ORAL | 1 refills | Status: DC | PRN

## 2020-10-09 NOTE — Patient Instructions (Signed)
Hip Bursitis  Hip bursitis is swelling of a fluid-filled sac (bursa) in your hip joint. This swelling (inflammation) can be painful. This condition may come and go over time. What are the causes?  Injury to the hip.  Overuse of the muscles that surround the hip joint.  An earlier injury or surgery of the hip.  Arthritis or gout.  Diabetes.  Thyroid disease.  Infection.  In some cases, the cause may not be known. What are the signs or symptoms?  Mild or moderate pain in the hip area. Pain may get worse with movement.  Tenderness and swelling of the hip, especially on the outer side of the hip.  In rare cases, the bursa may become infected. This may cause: ? A fever. ? Warmth and redness in the area. Symptoms may come and go. How is this treated? This condition is treated by resting, icing, applying pressure (compression), and raising (elevating) the injured area. You may hear this called the RICE treatment. Treatment may also include:  Using crutches.  Draining fluid out of the bursa to help relieve swelling.  Giving a shot of (injecting) medicine that helps to reduce swelling (cortisone).  Other medicines if the bursa is infected. Follow these instructions at home: Managing pain, stiffness, and swelling   If told, put ice on the painful area. ? Put ice in a plastic bag. ? Place a towel between your skin and the bag. ? Leave the ice on for 20 minutes, 2-3 times a day. ? Raise (elevate) your hip above the level of your heart as much as you can without pain. To do this, try putting a pillow under your hips while you lie down. Stop if this causes pain. Activity  Return to your normal activities as told by your doctor. Ask your doctor what activities are safe for you.  Rest and protect your hip as much as you can until you feel better. General instructions  Take over-the-counter and prescription medicines only as told by your doctor.  Wear wraps that put pressure  on your hip (compression wraps) only as told by your doctor.  Do not use your hip to support your body weight until your doctor says that you can.  Use crutches as told by your doctor.  Gently rub and stretch your injured area as often as is comfortable.  Keep all follow-up visits as told by your doctor. This is important. How is this prevented?  Exercise regularly, as told by your doctor.  Warm up and stretch before being active.  Cool down and stretch after being active.  Avoid activities that bother your hip or cause pain.  Avoid sitting down for long periods at a time. Contact a doctor if:  You have a fever.  You get new symptoms.  You have trouble walking.  You have trouble doing everyday activities.  You have pain that gets worse.  You have pain that does not get better with medicine.  You get red skin on your hip area.  You get a feeling of warmth in your hip area. Get help right away if:  You cannot move your hip.  You have very bad pain. Summary  Hip bursitis is swelling of a fluid-filled sac (bursa) in your hip.  Hip bursitis can be painful.  Symptoms often come and go over time.  This condition is treated with rest, ice, compression, elevation, and medicines. This information is not intended to replace advice given to you by your health care provider.   Make sure you discuss any questions you have with your health care provider. Document Revised: 07/23/2018 Document Reviewed: 07/23/2018 Elsevier Patient Education  2020 Elsevier Inc.  

## 2020-10-12 LAB — LIPID PANEL
Cholesterol: 85 mg/dL (ref <200)
HDL: 27 mg/dL — ABNORMAL LOW (ref >=50)
LDL Cholesterol (Calc): 39 mg/dL (calc)
Non-HDL Cholesterol (Calc): 58 mg/dL (calc) (ref <130)
Total CHOL/HDL Ratio: 3.1 (calc) (ref <5.0)
Triglycerides: 107 mg/dL (ref <150)

## 2020-10-12 LAB — COMPLETE METABOLIC PANEL WITH GFR
AG Ratio: 2 (calc) (ref 1.0–2.5)
ALT: 13 U/L (ref 6–29)
AST: 16 U/L (ref 10–30)
Albumin: 4.9 g/dL (ref 3.6–5.1)
Alkaline phosphatase (APISO): 60 U/L (ref 31–125)
BUN/Creatinine Ratio: 7 (calc) (ref 6–22)
BUN: 6 mg/dL — ABNORMAL LOW (ref 7–25)
CO2: 25 mmol/L (ref 20–32)
Calcium: 10.5 mg/dL — ABNORMAL HIGH (ref 8.6–10.2)
Chloride: 105 mmol/L (ref 98–110)
Creat: 0.84 mg/dL (ref 0.50–1.10)
GFR, Est African American: 105 mL/min/{1.73_m2} (ref >=60)
GFR, Est Non African American: 91 mL/min/{1.73_m2} (ref >=60)
Globulin: 2.4 g/dL (calc) (ref 1.9–3.7)
Glucose, Bld: 84 mg/dL (ref 65–99)
Potassium: 4.3 mmol/L (ref 3.5–5.3)
Sodium: 139 mmol/L (ref 135–146)
Total Bilirubin: 0.5 mg/dL (ref 0.2–1.2)
Total Protein: 7.3 g/dL (ref 6.1–8.1)

## 2020-10-12 LAB — VITAMIN B12: Vitamin B-12: 571 pg/mL (ref 200–1100)

## 2020-10-12 LAB — HEPATITIS C ANTIBODY
Hepatitis C Ab: NONREACTIVE
SIGNAL TO CUT-OFF: 0.01 (ref <1.00)

## 2020-10-13 ENCOUNTER — Ambulatory Visit: Payer: Medicare Other

## 2020-10-14 ENCOUNTER — Ambulatory Visit (INDEPENDENT_AMBULATORY_CARE_PROVIDER_SITE_OTHER): Payer: Medicare Other | Admitting: Obstetrics and Gynecology

## 2020-10-14 ENCOUNTER — Other Ambulatory Visit: Payer: Self-pay

## 2020-10-14 ENCOUNTER — Encounter: Payer: Self-pay | Admitting: Obstetrics and Gynecology

## 2020-10-14 VITALS — BP 112/76 | HR 93 | Ht 63.0 in | Wt 184.4 lb

## 2020-10-14 DIAGNOSIS — R5383 Other fatigue: Secondary | ICD-10-CM | POA: Diagnosis not present

## 2020-10-14 DIAGNOSIS — N938 Other specified abnormal uterine and vaginal bleeding: Secondary | ICD-10-CM | POA: Diagnosis not present

## 2020-10-14 MED ORDER — NORETHINDRONE ACETATE 5 MG PO TABS
10.0000 mg | ORAL_TABLET | Freq: Every day | ORAL | 1 refills | Status: DC
Start: 2020-10-14 — End: 2020-12-21

## 2020-10-14 NOTE — Progress Notes (Signed)
    GYNECOLOGY PROGRESS NOTE  Subjective:    Patient ID: Yesenia Wood, female    DOB: 11/25/1986, 34 y.o.   MRN: [id number]  ASL in-person interpreter used for today for encounter as patient is hearing impaired.   HPI  Patient is a 34 y.o. G0P0000 female who presents for 3 month  follow up of DUB.She was initiated on Aygestin last visit.  She complains that she is still having some dysfunctional bleeding. Also is noting some fatigue.    The following portions of the patient's history were reviewed and updated as appropriate: allergies, current medications, past family history, past medical history, past social history, past surgical history and problem list.  Review of Systems Pertinent items noted in HPI and remainder of comprehensive ROS otherwise negative.   Objective:   Blood pressure 112/76, pulse 93, height 5\' 3"  (1.6 m), weight 184 lb 6.4 oz (83.6 kg), last menstrual period 09/25/2020. General appearance: alert and no distress Abdomen: soft, non-tender; bowel sounds normal; no masses,  no organomegaly Pelvic: deferred.     Assessment:   1. DUB (dysfunctional uterine bleeding)   2. Fatigue, unspecified type    Plan:   1.  Patient with persistent dysfunctional uterine bleeding currently on Aygestin. She has tried multiple interventions to date, including several different OCPs, IUD, Depo-Provera, and a trial of 09/27/2020.  She also had a work-up for her abnormal bleeding and dysmenorrhea in the past, however that work-up was negative for endometriosis.  Again discussed limitations as patient has tried multiple options, only other options available include hormonal suppression with  danazol, Depo-Lupron (if insurance allows), or consideration of surgical intervention.  Patient still notes at this time she does not desire surgical intervention.  Patient okay to continue Aygestin but would like to increase the dose.  Prescribed 10 mg.  To f/u again in 3 months for reassessment.  Reiterated side effects from increasing dose. Take continuously for 3 months. If still with breakthrough bleeding, can decrease to 21 days on/7 days off regimen after 3  months.  2. Fatigue - will need to r/o anemia in light of patient's fatigue.  H/H ordered.   A total of 15 minutes were spent face-to-face with the patient during this encounter and over half of that time dealt with counseling and coordination of care.   [place], MD [facility]

## 2020-10-14 NOTE — Patient Instructions (Signed)
Increase Aygestin to 10 mg. Take continuously for 3 months. If still with breakthrough bleeding, can decrease to 21 days on/7 days off regimen after 3  months.

## 2020-10-14 NOTE — Progress Notes (Signed)
Pt present for follow up for abnormal vaginal bleeding. Pt stated that she is still spotting daily but her last heavy bleeding was 09/25/2020.

## 2020-10-15 DIAGNOSIS — H04123 Dry eye syndrome of bilateral lacrimal glands: Secondary | ICD-10-CM | POA: Diagnosis not present

## 2020-10-15 LAB — CBC
Hematocrit: 45.3 % (ref 34.0–46.6)
Hemoglobin: 14.9 g/dL (ref 11.1–15.9)
MCH: 29 pg (ref 26.6–33.0)
MCHC: 32.9 g/dL (ref 31.5–35.7)
MCV: 88 fL (ref 79–97)
Platelets: 330 10*3/uL (ref 150–450)
RBC: 5.13 x10E6/uL (ref 3.77–5.28)
RDW: 13 % (ref 11.7–15.4)
WBC: 6.8 10*3/uL (ref 3.4–10.8)

## 2020-10-16 ENCOUNTER — Encounter: Payer: Medicare Other | Admitting: Obstetrics and Gynecology

## 2020-10-21 DIAGNOSIS — G43109 Migraine with aura, not intractable, without status migrainosus: Secondary | ICD-10-CM | POA: Diagnosis not present

## 2020-10-21 DIAGNOSIS — M542 Cervicalgia: Secondary | ICD-10-CM | POA: Diagnosis not present

## 2020-10-21 DIAGNOSIS — G43719 Chronic migraine without aura, intractable, without status migrainosus: Secondary | ICD-10-CM | POA: Diagnosis not present

## 2020-10-21 DIAGNOSIS — G43111 Migraine with aura, intractable, with status migrainosus: Secondary | ICD-10-CM | POA: Diagnosis not present

## 2020-10-21 DIAGNOSIS — M791 Myalgia, unspecified site: Secondary | ICD-10-CM | POA: Diagnosis not present

## 2020-10-21 DIAGNOSIS — G518 Other disorders of facial nerve: Secondary | ICD-10-CM | POA: Diagnosis not present

## 2020-10-25 ENCOUNTER — Other Ambulatory Visit: Payer: Self-pay | Admitting: Family

## 2020-10-26 ENCOUNTER — Ambulatory Visit (INDEPENDENT_AMBULATORY_CARE_PROVIDER_SITE_OTHER): Payer: Medicare Other | Admitting: Licensed Clinical Social Worker

## 2020-10-26 ENCOUNTER — Other Ambulatory Visit: Payer: Self-pay

## 2020-10-26 DIAGNOSIS — F331 Major depressive disorder, recurrent, moderate: Secondary | ICD-10-CM | POA: Diagnosis not present

## 2020-10-26 DIAGNOSIS — F4010 Social phobia, unspecified: Secondary | ICD-10-CM

## 2020-10-26 NOTE — Progress Notes (Signed)
Virtual Visit via Telephone Note  I connected with Yesenia Wood on 10/26/20 at 11:00 AM EST by an audio enabled telemedicine application and verified that I am speaking with the correct person using two identifiers.  Location: Patient: home Provider: [facility]  Session Participants: Yesenia Wood, Interpreter: Yesenia Wood Yesenia Wood, Yesenia Wood, Yesenia Wood   I discussed the limitations of evaluation and management by telemedicine and the availability of in person appointments. The patient expressed understanding and agreed to proceed.  The patient was advised to call back or seek an in-person evaluation if the symptoms worsen or if the condition fails to improve as anticipated.  I provided 40 minutes of non-face-to-face time during this encounter.   Yesenia Gillaspie R Harriett Wood, Yesenia Wood    THERAPIST PROGRESS NOTE  Session Time: 11-11:40a  Participation Level: Active  Behavioral Response: NeatAlertDepressed  Type of Therapy: Individual Therapy  Treatment Goals addressed: Coping  Interventions: CBT and Supportive  Summary: Yesenia Wood is a 34 y.o. female who presents with continuing symptoms related to her depression and anxiety diagnosis. Pt reports that she is sleeping well.  Allowed pt to explore and express thoughts and feelings about current life events and current thanksgiving holiday. Pt spend holiday with parents and helped them cook.  Pt admits that she has had some hip pain, so has been physically inactive since last session. Pt moves when she has to, but has not had any elective exercise. Pts doctors do not feel hip situation is bad enough to warrant physical therapy, but pt not engaging in activity. Pt reports that she is socially isolated--not finding that online support groups to be very engaging. Pt feels "left out" at online family gatherings (pts family not doing face to face holiday gatherings).  Discussed technology-facilitated counseling sessions and  options for video sessions. Discussed consensus from counseling meeting in the past. Pt willing to try video formats--will experiment at next session.   Encouraged pt to continue with activities that she enjoys: cooking, crochet, reading. Pt is also currently helping out father, who just had surgery.   Suicidal/Homicidal: No  SI, HI, or AVH reported at time of session.  Therapist Response: Yesenia Wood reports that symptoms have not gotten better, and not gotten worse, which is reflective of intermittent/fluctuating progress. Treatment to continue as indicated.   Plan: Return again in 3 weeks. The ongoing treatment plan includes maintaining current levels of progress and continuing to build skills to manage mood, improve stress/anxiety management, emotion regulation, distress tolerance, and behavior modification.   Diagnosis: Axis I: Major Depression, Recurrent moderate and Social Anxiety    Axis II: No diagnosis    Yesenia Haber Lorrayne Ismael, Yesenia Wood 10/26/2020

## 2020-10-27 DIAGNOSIS — G43719 Chronic migraine without aura, intractable, without status migrainosus: Secondary | ICD-10-CM | POA: Diagnosis not present

## 2020-10-30 ENCOUNTER — Telehealth: Payer: Self-pay | Admitting: Family Medicine

## 2020-10-30 DIAGNOSIS — Z23 Encounter for immunization: Secondary | ICD-10-CM | POA: Diagnosis not present

## 2020-10-30 NOTE — Telephone Encounter (Signed)
Prior authorization submitted through CoverMyMeds using the [facility] 2017 Form.  PA Key: [id number] Rx #: [id number] Yesenia Wood 100MG  tablets  Currently waiting on determination from patient's insurance [facility])

## 2020-11-18 ENCOUNTER — Other Ambulatory Visit: Payer: Self-pay

## 2020-11-18 ENCOUNTER — Ambulatory Visit (INDEPENDENT_AMBULATORY_CARE_PROVIDER_SITE_OTHER): Payer: Medicare Other | Admitting: Licensed Clinical Social Worker

## 2020-11-18 DIAGNOSIS — F4010 Social phobia, unspecified: Secondary | ICD-10-CM | POA: Diagnosis not present

## 2020-11-18 DIAGNOSIS — F331 Major depressive disorder, recurrent, moderate: Secondary | ICD-10-CM
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
## 2020-12-02 DIAGNOSIS — G43719 Chronic migraine without aura, intractable, without status migrainosus: Secondary | ICD-10-CM | POA: Diagnosis not present

## 2020-12-02 DIAGNOSIS — G43109 Migraine with aura, not intractable, without status migrainosus: Secondary | ICD-10-CM | POA: Diagnosis not present

## 2020-12-02 DIAGNOSIS — G43111 Migraine with aura, intractable, with status migrainosus: Secondary | ICD-10-CM | POA: Diagnosis not present

## 2020-12-02 DIAGNOSIS — G518 Other disorders of facial nerve: Secondary | ICD-10-CM | POA: Diagnosis not present

## 2020-12-02 DIAGNOSIS — M791 Myalgia, unspecified site: Secondary | ICD-10-CM | POA: Diagnosis not present

## 2020-12-02 DIAGNOSIS — M542 Cervicalgia: Secondary | ICD-10-CM | POA: Diagnosis not present

## 2020-12-03 ENCOUNTER — Other Ambulatory Visit: Payer: Self-pay

## 2020-12-03 ENCOUNTER — Ambulatory Visit
Admission: RE | Admit: 2020-12-03 | Discharge: 2020-12-03 | Disposition: A | Discharge status: Home / Self Care | Payer: Medicare Other | Source: Ambulatory Visit | Attending: Nurse Practitioner | Admitting: Nurse Practitioner

## 2020-12-03 ENCOUNTER — Ambulatory Visit (INDEPENDENT_AMBULATORY_CARE_PROVIDER_SITE_OTHER): Payer: Medicare Other | Admitting: Nurse Practitioner

## 2020-12-03 ENCOUNTER — Encounter: Payer: Self-pay | Admitting: Nurse Practitioner

## 2020-12-03 ENCOUNTER — Encounter: Payer: Self-pay | Admitting: Cardiovascular Disease

## 2020-12-03 VITALS — BP 126/80 | HR 95 | Ht 63.0 in | Wt 189.0 lb

## 2020-12-03 DIAGNOSIS — M7989 Other specified soft tissue disorders: Secondary | ICD-10-CM

## 2020-12-03 DIAGNOSIS — R000 Tachycardia, unspecified: Secondary | ICD-10-CM

## 2020-12-03 DIAGNOSIS — I4711 Inappropriate sinus tachycardia, so stated: Secondary | ICD-10-CM

## 2020-12-03 NOTE — Patient Instructions (Signed)
Medication Instructions:   Your physician recommends that you continue on your current medications as directed. Please refer to the Current Medication list given to you today.   *If you need a refill on your cardiac medications before your next appointment, please call your pharmacy*   Lab Work: None ordered    Testing/Procedures: Your physician has requested that you have a lower venous duplex. This test is an ultrasound of the veins in the legs. It looks at venous blood flow that carries blood from the heart to the legs or arms. Allow one hour for a Lower Venous exam. There are no restrictions or special instructions.    Follow-Up: At [facility], you and your health needs are our priority.  As part of our continuing mission to provide you with exceptional heart care, we have created designated Provider Care Teams.  These Care Teams include your primary Cardiologist (physician) and Advanced Practice Providers (APPs -  Physician Assistants and Nurse Practitioners) who all work together to provide you with the care you need, when you need it.  We recommend signing up for the patient portal called "MyChart".  Sign up information is provided on this After Visit Summary.  MyChart is used to connect with patients for Virtual Visits (Telemedicine).  Patients are able to view lab/test results, encounter notes, upcoming appointments, etc.  Non-urgent messages can be sent to your provider as well.   To learn more about what you can do with MyChart, go to [email or web address].    Your next appointment:   6 month(s)  The format for your next appointment:   In Person  Provider:   You may see Lorine Bears, MD or one of the following Advanced Practice Providers on your designated Care Team:     Gillian Shields, NP

## 2020-12-03 NOTE — Telephone Encounter (Signed)
Made in error
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
## 2020-12-04 ENCOUNTER — Telehealth: Payer: Self-pay | Admitting: *Deleted

## 2020-12-04 NOTE — Telephone Encounter (Signed)
Reviewed results of testing and she then inquired what she should do for the swelling. Reviewed office visit note and recommendations were to wear compression hose/socks to help with that. She verbalized understanding of our conversation, agreement with plan, and had no further questions at this time.

## 2020-12-04 NOTE — Telephone Encounter (Signed)
Left voicemail message to call back regarding results. 

## 2020-12-04 NOTE — Telephone Encounter (Signed)
-----   Message from Creig Hines, NP sent at 12/03/2020  6:05 PM EST ----- No DVT on lower ext u/s.  Reassuring!

## 2020-12-20 ENCOUNTER — Other Ambulatory Visit: Payer: Self-pay | Admitting: Obstetrics and Gynecology

## 2020-12-23 ENCOUNTER — Ambulatory Visit (INDEPENDENT_AMBULATORY_CARE_PROVIDER_SITE_OTHER): Payer: Medicare Other | Admitting: Licensed Clinical Social Worker

## 2020-12-23 ENCOUNTER — Other Ambulatory Visit: Payer: Self-pay

## 2020-12-23 DIAGNOSIS — F4010 Social phobia, unspecified: Secondary | ICD-10-CM

## 2020-12-23 DIAGNOSIS — F331 Major depressive disorder, recurrent, moderate: Secondary | ICD-10-CM
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
## 2020-12-30 ENCOUNTER — Telehealth: Payer: Self-pay | Admitting: Family Medicine

## 2020-12-30 NOTE — Telephone Encounter (Signed)
Patient called to request that her AWV be a phone appt. On 02/15.  Please call patient to confirm.  CB# [phone]

## 2020-12-30 NOTE — Telephone Encounter (Signed)
Called patient back and we scheduled for march 1st 2022 at 10:40

## 2021-01-12 ENCOUNTER — Ambulatory Visit: Payer: Medicare Other

## 2021-01-13 ENCOUNTER — Ambulatory Visit (INDEPENDENT_AMBULATORY_CARE_PROVIDER_SITE_OTHER): Payer: Medicare Other | Admitting: Licensed Clinical Social Worker

## 2021-01-13 ENCOUNTER — Other Ambulatory Visit: Payer: Self-pay

## 2021-01-13 DIAGNOSIS — F331 Major depressive disorder, recurrent, moderate: Secondary | ICD-10-CM

## 2021-01-13 DIAGNOSIS — F4010 Social phobia, unspecified: Secondary | ICD-10-CM

## 2021-01-13 NOTE — Progress Notes (Signed)
Virtual Visit via Audio Note  I connected with [name] on 01/13/21 at  2:30 PM EST by an audio enabled telemedicine application and verified that I am speaking with the correct person using two identifiers.  Difficulties with initial phone call--start of session.  Clinician tried 6 x to connect with pt. Saw that pt was connected through MyChart so connected briefly via video chat and tried through interpreter line again--call went through.   Location: Patient: home Provider: remote office [place], [place])   I discussed the limitations of evaluation and management by telemedicine and the availability of in person appointments. The patient expressed understanding and agreed to proceed.  I discussed the assessment and treatment plan with the patient. The patient was provided an opportunity to ask questions and all were answered. The patient agreed with the plan and demonstrated an understanding of the instructions.   The patient was advised to call back or seek an in-person evaluation if the symptoms worsen or if the condition fails to improve as anticipated.  I provided 30 minutes of non-face-to-face time during this encounter.   Olia Hinderliter R Mallorey Odonell, LCSW    THERAPIST PROGRESS NOTE  Session Time: 2:30-3p  Participation Level: Active  Behavioral Response: NeatAlertDepressed  Type of Therapy: Individual Therapy  Treatment Goals addressed: Coping  Interventions: CBT and Supportive  Summary: PRESTON WEILL is a 35 y.o. female who presents with improving symptoms related to depression and anxiety. Pt reports that mood is stable and that she is trying harder to manage stress/anxiety better. Pt reports that she has a stationary bike and is trying to utilize a few times a week. Discussed baby steps and working her way up to where she wants to be.  Reviewed eating behaviors--pt states that she would like to lose some weight because she feels it would make her feel better  physically. Discussed portion control, limiting fats/sweets, and increasing water intake. Encouraged [place].  Pt states relationships with parents are good--enjoys cooking/baking and playing board games with parents.   Discussed self-care behaviors and ways that pt could incorporate more each day.    Suicidal/Homicidal: No  Therapist Response: Tyeesha is developing healthy cognitive patterns about self.  Shacarra is being intentional about engaging in activities to help elevate mood. Pt is able to verbalize how fearful thinking impacts everyday thoughts, feelings, and behaviors and pt is able to push through her comfort zone. These behaviors are evidence that pt is progressing towards overall goals. Pt still not socializing with anyone outside of medical facilities and family members.  Treatment to continue as indicated.    Plan: Return again in 4 weeks.  Diagnosis: Axis I: Major Depression, Recurrent severe and Social Anxiety    Axis II: No diagnosis    Ernest Haber Anyiah Coverdale, LCSW 01/13/2021

## 2021-01-19 ENCOUNTER — Ambulatory Visit: Payer: Self-pay | Admitting: *Deleted

## 2021-01-19 NOTE — Telephone Encounter (Signed)
Patient is calling to report she is having rash and sore throat- she has taken a home COVID test which is negative. Patient wants to know if she needs to cancel her GYN appointment. Advised call office.

## 2021-01-19 NOTE — Telephone Encounter (Signed)
Call disconnect- signal failure. Call completed by another triage nurse at [facility].

## 2021-01-19 NOTE — Telephone Encounter (Signed)
  Pt called back in after the phone line was disconnected while speaking with another triage nurse.  She is c/o a rash of very small dots and most of them you can feel versus seeing them.    Under her chin, neck, and collarbone.   They itch a lot.   Does not know what is causing them.  She is also having a headache, sore throat and diarrhea.  Did a covid test this morning and it's negative.   [place] has not been in contact with anyone for a month.  She is hearing impaired so uses a sign language interpreting service so appt is a phone call instead of video for this reason. Covid questionnaire completed.  I sent my notes to [facility] for Dr. Linwood Dibbles.   Appt is 01/20/2021 at 4:00.     Reason for Disposition . Hives or itching    No hives.  Small bumps some red, some pink that itch a lot for last 2-3 days.   Mostly feels the bumps under chin, collarbone and neck.  Answer Assessment - Initial Assessment Questions 1. APPEARANCE of RASH: "Describe the rash." (e.g., spots, blisters, raised areas, skin peeling, scaly)     She has a rash.   Tiny bumps on neck, collarbone and chin.   I can feel them.   Some are red and some pinkish 2. SIZE: "How big are the spots?" (e.g., tip of pen, eraser, coin; inches, centimeters)     Very small bumps   No pus 3. LOCATION: "Where is the rash located?"     See above 4. COLOR: "What color is the rash?" (Note: It is difficult to assess rash color in people with darker-colored skin. When this situation occurs, simply ask the caller to describe what they see.)     See above 5. ONSET: "When did the rash begin?"     Itching but no pain. Started 2-3 days ago.   I don't know what is  Causing them. 6. FEVER: "Do you have a fever?" If Yes, ask: "What is your temperature, how was it measured, and when did it start?"     I don't think so.    I feel good 7. ITCHING: "Does the rash itch?" If Yes, ask: "How bad is the itch?" (Scale 1-10; or  mild, moderate, severe)     Yes 8. CAUSE: "What do you think is causing the rash?"     I don't know 9. NEW MEDICATION: "What new medication are you taking?" (e.g., name of antibiotic) "When did you start taking this medication?".     No new medications.    I took a Benadryl and it helped with the itch. 10. OTHER SYMPTOMS: "Do you have any other symptoms?" (e.g., sore throat, fever, joint pain)       I have a sore throat and a headache.   Did a covid test this morning and it was negative.   I had diarrhea last night.    I've not been out for a month.   [place]   11. PREGNANCY: "Is there any chance you are pregnant?" "When was your last menstrual period?"       Not asked  Protocols used: RASH - WIDESPREAD ON DRUGS-A-AH

## 2021-01-20 ENCOUNTER — Encounter: Payer: Self-pay | Admitting: Family Medicine

## 2021-01-20 ENCOUNTER — Encounter: Payer: Medicare Other | Admitting: Obstetrics and Gynecology

## 2021-01-20 ENCOUNTER — Other Ambulatory Visit: Payer: Self-pay | Admitting: Obstetrics and Gynecology

## 2021-01-20 ENCOUNTER — Telehealth (INDEPENDENT_AMBULATORY_CARE_PROVIDER_SITE_OTHER): Payer: Medicare Other | Admitting: Family Medicine

## 2021-01-20 DIAGNOSIS — J029 Acute pharyngitis, unspecified: Secondary | ICD-10-CM | POA: Diagnosis not present

## 2021-01-20 MED ORDER — HYDROXYZINE HCL 25 MG PO TABS: 25.0000 mg | ORAL_TABLET | Freq: Three times a day (TID) | ORAL | 0 refills | Status: DC | PRN

## 2021-01-20 NOTE — Progress Notes (Signed)
Virtual Visit via Video Note  I connected with Yesenia Wood on 01/20/21 at  4:00 PM EST by a video enabled telemedicine application and verified that I am speaking with the correct person using two identifiers.  Location: Patient: home Provider: [facility]   I discussed the limitations of evaluation and management by telemedicine and the availability of in person appointments. The patient expressed understanding and agreed to proceed.  History of Present Illness:  SORE THROAT, RASH - sore throat started Saturday - then subsequently developed bumpy rash on jawline, which spread down to collarbone and chest. Only itches in neck. Sometimes red. Sometimes feels hot to touch. No bleeding or sloughing of skin, discharge or blisters. No change in medications recently. - COVID negative yesterday at home. - fully COVID vaccinated including booster, no known contacts  Fever: no Cough: no Shortness of breath: no Wheezing: unsure Chest pain: no Chest tightness: no Chest congestion: no Nasal congestion: nothing new Runny nose: yes Sneezing: yes Sore throat: yes Sinus pressure: no Headache: yes, different than migraines Ear pain: nothing new  Ear pressure: no  Eye drainage/crusting: R eye  Vomiting: no Rash: yes Sick contacts: no Context: worse Recurrent sinusitis: no Relief with OTC cold/cough medications: hasn't tried  Treatments attempted: benadryl helped with itching.  Observations/Objective:  Patient is deaf, entirety of visit conducted over the phone with sign language interpreter.  Unable to examine patient.  Assessment and Plan:  Viral illness Symptoms most consistent with likely viral illness. Will retest for COVID. If negative, recommend in person appointment for evaluation of rash. Can also try to send picture via MyChart. Will send atarax for help with itching. Recommend OTC symptom relief for sore throat with warm liquids, lozenges etc. Reviewed self-quarantine  guidelines until testing results.     I discussed the assessment and treatment plan with the patient. The patient was provided an opportunity to ask questions and all were answered. The patient agreed with the plan and demonstrated an understanding of the instructions.   The patient was advised to call back or seek an in-person evaluation if the symptoms worsen or if the condition fails to improve as anticipated.  I provided 20 minutes of non-face-to-face time during this encounter.   Caro Laroche, DO

## 2021-01-20 NOTE — Patient Instructions (Addendum)
Our plans for today:  - See below for self-isolation guidelines. You may end your quarantine if your test is negative or once you are 10 days from symptom onset and fever free for 24 hours without use of tylenol or ibuprofen.  - If your test is positive, you may be a candidate for COVID treatment with antibodies or anti-virals. If positive, we will be referring you for this.  Someone would contact you about scheduling this if you qualify. We are experiencing a Sport and exercise psychologist of this and the oral antivirals as well as a backlog of patients needing treatment so this may cause a delay. - Try the hydroxyzine for itching. - Try warm teas, liquids, soups for your sore throat. - Certainly, if you are having difficulties breathing or unable to keep down fluids, go to the Emergency Department.   Take care and seek immediate care sooner if you develop any concerns.   Dr. Linwood Dibbles     Person Under Monitoring Name: Yesenia Wood  Location: [address] [place] [address]   Infection Prevention Recommendations for Individuals Confirmed to have, or Being Evaluated for, 2019 Novel Coronavirus (COVID-19) Infection Who Receive Care at Home  Individuals who are confirmed to have, or are being evaluated for, COVID-19 should follow the prevention steps below until a healthcare provider or local or state health department says they can return to normal activities.  Stay home except to get medical care You should restrict activities outside your home, except for getting medical care. Do not go to work, school, or public areas, and do not use public transportation or taxis.  Call ahead before visiting your doctor Before your medical appointment, call the healthcare provider and tell them that you have, or are being evaluated for, COVID-19 infection. This will help the healthcare provider's office take steps to keep other people from getting infected. Ask your healthcare provider to call  the local or state health department.  Monitor your symptoms Seek prompt medical attention if your illness is worsening (e.g., difficulty breathing). Before going to your medical appointment, call the healthcare provider and tell them that you have, or are being evaluated for, COVID-19 infection. Ask your healthcare provider to call the local or state health department.  Wear a facemask You should wear a facemask that covers your nose and mouth when you are in the same room with other people and when you visit a healthcare provider. People who live with or visit you should also wear a facemask while they are in the same room with you.  Separate yourself from other people in your home As much as possible, you should stay in a different room from other people in your home. Also, you should use a separate bathroom, if available.  Avoid sharing household items You should not share dishes, drinking glasses, cups, eating utensils, towels, bedding, or other items with other people in your home. After using these items, you should wash them thoroughly with soap and water.  Cover your coughs and sneezes Cover your mouth and nose with a tissue when you cough or sneeze, or you can cough or sneeze into your sleeve. Throw used tissues in a lined trash can, and immediately wash your hands with soap and water for at least 20 seconds or use an alcohol-based hand rub.  Wash your [employer] your hands often and thoroughly with soap and water for at least 20 seconds. You can use an alcohol-based hand sanitizer if soap and  water are not available and if your hands are not visibly dirty. Avoid touching your eyes, nose, and mouth with unwashed hands.   Prevention Steps for Caregivers and Household Members of Individuals Confirmed to have, or Being Evaluated for, COVID-19 Infection Being Cared for in the Home  If you live with, or provide care at home for, a person confirmed to have, or being evaluated  for, COVID-19 infection please follow these guidelines to prevent infection:  Follow healthcare provider's instructions Make sure that you understand and can help the patient follow any healthcare provider instructions for all care.  Provide for the patient's basic needs You should help the patient with basic needs in the home and provide support for getting groceries, prescriptions, and other personal needs.  Monitor the patient's symptoms If they are getting sicker, call his or her medical provider and tell them that the patient has, or is being evaluated for, COVID-19 infection. This will help the healthcare provider's office take steps to keep other people from getting infected. Ask the healthcare provider to call the local or state health department.  Limit the number of people who have contact with the patient  If possible, have only one caregiver for the patient.  Other household members should stay in another home or place of residence. If this is not possible, they should stay  in another room, or be separated from the patient as much as possible. Use a separate bathroom, if available.  Restrict visitors who do not have an essential need to be in the home.  Keep older adults, very young children, and other sick people away from the patient Keep older adults, very young children, and those who have compromised immune systems or chronic health conditions away from the patient. This includes people with chronic heart, lung, or kidney conditions, diabetes, and cancer.  Ensure good ventilation Make sure that shared spaces in the home have good air flow, such as from an air conditioner or an opened window, weather permitting.  Wash your hands often  Wash your hands often and thoroughly with soap and water for at least 20 seconds. You can use an alcohol based hand sanitizer if soap and water are not available and if your hands are not visibly dirty.  Avoid touching your eyes,  nose, and mouth with unwashed hands.  Use disposable paper towels to dry your hands. If not available, use dedicated cloth towels and replace them when they become wet.  Wear a facemask and gloves  Wear a disposable facemask at all times in the room and gloves when you touch or have contact with the patient's blood, body fluids, and/or secretions or excretions, such as sweat, saliva, sputum, nasal mucus, vomit, urine, or feces.  Ensure the mask fits over your nose and mouth tightly, and do not touch it during use.  Throw out disposable facemasks and gloves after using them. Do not reuse.  Wash your hands immediately after removing your facemask and gloves.  If your personal clothing becomes contaminated, carefully remove clothing and launder. Wash your hands after handling contaminated clothing.  Place all used disposable facemasks, gloves, and other waste in a lined container before disposing them with other household waste.  Remove gloves and wash your hands immediately after handling these items.  Do not share dishes, glasses, or other household items with the patient  Avoid sharing household items. You should not share dishes, drinking glasses, cups, eating utensils, towels, bedding, or other items with a patient who is  confirmed to have, or being evaluated for, COVID-19 infection.  After the person uses these items, you should wash them thoroughly with soap and water.  Wash laundry thoroughly  Immediately remove and wash clothes or bedding that have blood, body fluids, and/or secretions or excretions, such as sweat, saliva, sputum, nasal mucus, vomit, urine, or feces, on them.  Wear gloves when handling laundry from the patient.  Read and follow directions on labels of laundry or clothing items and detergent. In general, wash and dry with the warmest temperatures recommended on the label.  Clean all areas the individual has used often  Clean all touchable surfaces, such as  counters, tabletops, doorknobs, bathroom fixtures, toilets, phones, keyboards, tablets, and bedside tables, every day. Also, clean any surfaces that may have blood, body fluids, and/or secretions or excretions on them.  Wear gloves when cleaning surfaces the patient has come in contact with.  Use a diluted bleach solution (e.g., dilute bleach with 1 part bleach and 10 parts water) or a household disinfectant with a label that says EPA-registered for coronaviruses. To make a bleach solution at home, add 1 tablespoon of bleach to 1 quart (4 cups) of water. For a larger supply, add  cup of bleach to 1 gallon (16 cups) of water.  Read labels of cleaning products and follow recommendations provided on product labels. Labels contain instructions for safe and effective use of the cleaning product including precautions you should take when applying the product, such as wearing gloves or eye protection and making sure you have good ventilation during use of the product.  Remove gloves and wash hands immediately after cleaning.  Monitor yourself for signs and symptoms of illness Caregivers and household members are considered close contacts, should monitor their health, and will be asked to limit movement outside of the home to the extent possible. Follow the monitoring steps for close contacts listed on the symptom monitoring form.   ? If you have additional questions, contact your local health department or call the epidemiologist on call at [phone] (available 24/7). ? This guidance is subject to change. For the most up-to-date guidance from [facility], please refer to their website: [email or web address]

## 2021-01-26 ENCOUNTER — Ambulatory Visit (INDEPENDENT_AMBULATORY_CARE_PROVIDER_SITE_OTHER): Payer: Medicare Other

## 2021-01-26 VITALS — HR 82 | Temp 98.8°F | Ht 63.0 in | Wt 181.0 lb

## 2021-01-26 DIAGNOSIS — Z748 Other problems related to care provider dependency: Secondary | ICD-10-CM | POA: Diagnosis not present

## 2021-01-26 DIAGNOSIS — Z0000 Encounter for general adult medical examination without abnormal findings: Secondary | ICD-10-CM | POA: Diagnosis not present

## 2021-01-26 NOTE — Patient Instructions (Signed)
Yesenia Wood , Thank you for taking time to come for your Medicare Wellness Visit. I appreciate your ongoing commitment to your health goals. Please review the following plan we discussed and let me know if I can assist you in the future.   Screening recommendations/referrals: Colonoscopy: due age 35 Mammogram: due age 58 Bone Density: due age 57 Recommended yearly ophthalmology/optometry visit for glaucoma screening and checkup Recommended yearly dental visit for hygiene and checkup  Vaccinations: Influenza vaccine: done 09/08/20 Pneumococcal vaccine: done 11/24/14 Tdap vaccine: done 03/16/16 Shingles vaccine: due age 14  Covid-19: done 03/03/20, 03/24/20 & 10/30/20  Conditions/risks identified: Recommend healthy eating and physical activity for desired weight loss  Next appointment: Follow up in one year for your annual wellness visit.   Preventive Care 40-64 Years, Female Preventive care refers to lifestyle choices and visits with your health care provider that can promote health and wellness. What does preventive care include?  A yearly physical exam. This is also called an annual well check.  Dental exams once or twice a year.  Routine eye exams. Ask your health care provider how often you should have your eyes checked.  Personal lifestyle choices, including:  Daily care of your teeth and gums.  Regular physical activity.  Eating a healthy diet.  Avoiding tobacco and drug use.  Limiting alcohol use.  Practicing safe sex.  Taking low-dose aspirin daily starting at age 20.  Taking vitamin and mineral supplements as recommended by your health care provider. What happens during an annual well check? The services and screenings done by your health care provider during your annual well check will depend on your age, overall health, lifestyle risk factors, and family history of disease. Counseling  Your health care provider may ask you questions about your:  Alcohol  use.  Tobacco use.  Drug use.  Emotional well-being.  Home and relationship well-being.  Sexual activity.  Eating habits.  Work and work Statistician.  Method of birth control.  Menstrual cycle.  Pregnancy history. Screening  You may have the following tests or measurements:  Height, weight, and BMI.  Blood pressure.  Lipid and cholesterol levels. These may be checked every 5 years, or more frequently if you are over 39 years old.  Skin check.  Lung cancer screening. You may have this screening every year starting at age 56 if you have a 30-pack-year history of smoking and currently smoke or have quit within the past 15 years.  Fecal occult blood test (FOBT) of the stool. You may have this test every year starting at age 94.  Flexible sigmoidoscopy or colonoscopy. You may have a sigmoidoscopy every 5 years or a colonoscopy every 10 years starting at age 66.  Hepatitis C blood test.  Hepatitis B blood test.  Sexually transmitted disease (STD) testing.  Diabetes screening. This is done by checking your blood sugar (glucose) after you have not eaten for a while (fasting). You may have this done every 1-3 years.  Mammogram. This may be done every 1-2 years. Talk to your health care provider about when you should start having regular mammograms. This may depend on whether you have a family history of breast cancer.  BRCA-related cancer screening. This may be done if you have a family history of breast, ovarian, tubal, or peritoneal cancers.  Pelvic exam and Pap test. This may be done every 3 years starting at age 70. Starting at age 58, this may be done every 5 years if you have a Pap  test in combination with an HPV test.  Bone density scan. This is done to screen for osteoporosis. You may have this scan if you are at high risk for osteoporosis. Discuss your test results, treatment options, and if necessary, the need for more tests with your health care  provider. Vaccines  Your health care provider may recommend certain vaccines, such as:  Influenza vaccine. This is recommended every year.  Tetanus, diphtheria, and acellular pertussis (Tdap, Td) vaccine. You may need a Td booster every 10 years.  Zoster vaccine. You may need this after age 78.  Pneumococcal 13-valent conjugate (PCV13) vaccine. You may need this if you have certain conditions and were not previously vaccinated.  Pneumococcal polysaccharide (PPSV23) vaccine. You may need one or two doses if you smoke cigarettes or if you have certain conditions. Talk to your health care provider about which screenings and vaccines you need and how often you need them. This information is not intended to replace advice given to you by your health care provider. Make sure you discuss any questions you have with your health care provider. Document Released: 12/11/2015 Document Revised: 08/03/2016 Document Reviewed: 09/15/2015 Elsevier Interactive Patient Education  2017 [employer] Prevention in the Home Falls can cause injuries. They can happen to people of all ages. There are many things you can do to make your home safe and to help prevent falls. What can I do on the outside of my home?  Regularly fix the edges of walkways and driveways and fix any cracks.  Remove anything that might make you trip as you walk through a door, such as a raised step or threshold.  Trim any bushes or trees on the path to your home.  Use bright outdoor lighting.  Clear any walking paths of anything that might make someone trip, such as rocks or tools.  Regularly check to see if handrails are loose or broken. Make sure that both sides of any steps have handrails.  Any raised decks and porches should have guardrails on the edges.  Have any leaves, snow, or ice cleared regularly.  Use sand or salt on walking paths during winter.  Clean up any spills in your garage right away. This includes  oil or grease spills. What can I do in the bathroom?  Use night lights.  Install grab bars by the toilet and in the tub and shower. Do not use towel bars as grab bars.  Use non-skid mats or decals in the tub or shower.  If you need to sit down in the shower, use a plastic, non-slip stool.  Keep the floor dry. Clean up any water that spills on the floor as soon as it happens.  Remove soap buildup in the tub or shower regularly.  Attach bath mats securely with double-sided non-slip rug tape.  Do not have throw rugs and other things on the floor that can make you trip. What can I do in the bedroom?  Use night lights.  Make sure that you have a light by your bed that is easy to reach.  Do not use any sheets or blankets that are too big for your bed. They should not hang down onto the floor.  Have a firm chair that has side arms. You can use this for support while you get dressed.  Do not have throw rugs and other things on the floor that can make you trip. What can I do in the kitchen?  Clean up  any spills right away.  Avoid walking on wet floors.  Keep items that you use a lot in easy-to-reach places.  If you need to reach something above you, use a strong step stool that has a grab bar.  Keep electrical cords out of the way.  Do not use floor polish or wax that makes floors slippery. If you must use wax, use non-skid floor wax.  Do not have throw rugs and other things on the floor that can make you trip. What can I do with my stairs?  Do not leave any items on the stairs.  Make sure that there are handrails on both sides of the stairs and use them. Fix handrails that are broken or loose. Make sure that handrails are as long as the stairways.  Check any carpeting to make sure that it is firmly attached to the stairs. Fix any carpet that is loose or worn.  Avoid having throw rugs at the top or bottom of the stairs. If you do have throw rugs, attach them to the floor  with carpet tape.  Make sure that you have a light switch at the top of the stairs and the bottom of the stairs. If you do not have them, ask someone to add them for you. What else can I do to help prevent falls?  Wear shoes that:  Do not have high heels.  Have rubber bottoms.  Are comfortable and fit you well.  Are closed at the toe. Do not wear sandals.  If you use a stepladder:  Make sure that it is fully opened. Do not climb a closed stepladder.  Make sure that both sides of the stepladder are locked into place.  Ask someone to hold it for you, if possible.  Clearly mark and make sure that you can see:  Any grab bars or handrails.  First and last steps.  Where the edge of each step is.  Use tools that help you move around (mobility aids) if they are needed. These include:  Canes.  Walkers.  Scooters.  Crutches.  Turn on the lights when you go into a dark area. Replace any light bulbs as soon as they burn out.  Set up your furniture so you have a clear path. Avoid moving your furniture around.  If any of your floors are uneven, fix them.  If there are any pets around you, be aware of where they are.  Review your medicines with your doctor. Some medicines can make you feel dizzy. This can increase your chance of falling. Ask your doctor what other things that you can do to help prevent falls. This information is not intended to replace advice given to you by your health care provider. Make sure you discuss any questions you have with your health care provider. Document Released: 09/10/2009 Document Revised: 04/21/2016 Document Reviewed: 12/19/2014 Elsevier Interactive Patient Education  2017 [employer].

## 2021-01-26 NOTE — Progress Notes (Signed)
Subjective:   Yesenia Wood is a 35 y.o. female who presents for Medicare Annual (Subsequent) preventive examination.  Virtual Visit via Telephone Note  I connected with  Yesenia Wood on 01/26/21 at 10:40 AM EST by telephone and verified that I am speaking with the correct person using two identifiers.  Location: Patient: home Provider: [facility] Persons participating in the virtual visit: patient/Nurse Health Advisor   I discussed the limitations, risks, security and privacy concerns of performing an evaluation and management service by telephone and the availability of in person appointments. The patient expressed understanding and agreed to proceed.  Interactive audio and video telecommunications were attempted between this nurse and patient, however failed, due to patient having technical difficulties OR patient did not have access to video capability.  We continued and completed visit with audio only.  Some vital signs may be absent or patient reported.   Reather Littler, LPN    Review of Systems     Cardiac Risk Factors include: dyslipidemia;obesity (BMI >30kg/m2)     Objective:    Today's Vitals   01/26/21 1049 01/26/21 1051  Pulse: 82   Temp: 98.8 F (37.1 C)   TempSrc: Oral   Weight: 181 lb (82.1 kg)   Height: 5\' 3"  (1.6 m)   PainSc:  6    Body mass index is 32.06 kg/m.  Advanced Directives 04/20/2020 03/26/2018 03/20/2018 12/11/2017 04/11/2017 11/24/2016 10/10/2016  Does Patient Have a Medical Advance Directive? No No No No No No No  Would patient like information on creating a medical advance directive? No - Patient declined - No - Patient declined No - Patient declined - No - Patient declined No - patient declined information  Some encounter information is confidential and restricted. Go to Review Flowsheets activity to see all data.    Current Medications (verified) Outpatient Encounter Medications as of 01/26/2021  Medication Sig  . acebutolol (SECTRAL) 200  MG capsule TAKE 1 CAPSULE BY MOUTH TWICE A DAY  . Acetaminophen-Caff-Pyrilamine (MIDOL COMPLETE PO) Take by mouth.  03/28/2021 BOTOX 100 units SOLR injection every 3 (three) months.   . calcium carbonate (OS-CAL) 1250 (500 Ca) MG chewable tablet Chew by mouth.  . calcium carbonate (TUMS - DOSED IN MG ELEMENTAL CALCIUM) 500 MG chewable tablet Chew 1 tablet by mouth daily.  . Cyanocobalamin (VITAMIN B-12) 500 MCG SUBL Place 1 tablet (500 mcg total) under the tongue once a week.  . diphenhydrAMINE (BENADRYL) 50 MG tablet Take 50 mg by mouth at bedtime as needed for itching.  Marland Kitchen FOLIC ACID PO Take by mouth. 800 mcg  . Ibuprofen 200 MG CAPS Take by mouth.  . norethindrone (AYGESTIN) 5 MG tablet TAKE 2 TABLETS BY MOUTH EVERY DAY  . rosuvastatin (CRESTOR) 20 MG tablet Take 1 tablet (20 mg total) by mouth daily.  . hydrOXYzine (ATARAX/VISTARIL) 25 MG tablet Take 1 tablet (25 mg total) by mouth 3 (three) times daily as needed. (Patient not taking: Reported on 01/26/2021)  . [DISCONTINUED] Ubrogepant (UBRELVY) 100 MG TABS Take 1 tablet by mouth daily as needed. (Patient not taking: Reported on 01/20/2021)   No facility-administered encounter medications on file as of 01/26/2021.    Allergies (verified) Budesonide-formoterol fumarate, Astelin [azelastine], Gabapentin, Nortriptyline, Lamictal [lamotrigine], Milnacipran, Tegaderm ag mesh [silver], Diclofenac, Duloxetine hcl, Montelukast sodium, Mupirocin, Naproxen, Naproxen sodium, Other, Oxycodone hcl, and Sulfa antibiotics   History: Past Medical History:  Diagnosis Date  . ADHD (attention deficit hyperactivity disorder)   . B12 deficiency   .  Bell's palsy    right sided  . Chest pain    a. 05/2015 ETT: elev HR/BP, but no acute ST/T changes; b. 09/2019 Cor CTA: Nl cors, Ca2+ = 0.  . Chronic migraine   . Deaf   . Depression   . 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 Fatigue   . Fatty liver   . Fibromyalgia    . GERD (gastroesophageal reflux disease)   . Headache   . High triglycerides   . Hyperglycemia   . Hypertension   . Hyperthyroidism   . Moderate asthma   . Obesity   . Paresthesia   . Premature birth    Past Surgical History:  Procedure Laterality Date  . BARTHOLIN GLAND CYST REMOVAL    . COCHLEAR IMPLANT    . COCHLEAR IMPLANT REMOVAL    . LAPAROSCOPIC TUBAL LIGATION Bilateral 03/26/2018   Procedure: LAPAROSCOPIC TUBAL LIGATION;  Surgeon: Hildred Laserherry, Anika, MD;  Location: [facility] ORS;  Service: Gynecology;  Laterality: Bilateral;  with peritoneal biopsies  . LAPAROSCOPY    . MYRINGOTOMY WITH TUBE PLACEMENT    . TUBAL LIGATION  03/26/2018   Family History  Adopted: Yes  Family history unknown: Yes   Social History   Socioeconomic History  . Marital status: Single    Spouse name: Not on file  . Number of children: 0  . Years of education: Not on file  . Highest education level: Some college, no degree  Occupational History  . Occupation: disability for hearing loss  Tobacco Use  . Smoking status: Former Smoker    Packs/day: 1.00    Years: 2.00    Pack years: 2.00    Types: Cigarettes    Start date: 11/28/2004    Quit date: 11/28/2006    Years since quitting: 14.1  . Smokeless tobacco: Never Used  Vaping Use  . Vaping Use: Never used  Substance and Sexual Activity  . Alcohol use: Yes    Alcohol/week: 0.0 standard drinks    Comment: occasional   . Drug use: No  . Sexual activity: Not Currently    Birth control/protection: Pill    Comment: Seasonale  Other Topics Concern  . Not on file  Social History Narrative   Lives with her adopted  parents   Hearing loss since birth    Social Determinants of Health   Financial Resource Strain: Low Risk   . Difficulty of Paying Living Expenses: Not very hard  Food Insecurity: No Food Insecurity  . Worried About Programme researcher, broadcasting/film/videounning Out of Food in the Last Year: Never true  . Ran Out of Food in the Last Year: Never true  Transportation  Needs: Unmet Transportation Needs  . Lack of Transportation (Medical): Yes  . Lack of Transportation (Non-Medical): No  Physical Activity: Insufficiently Active  . Days of Exercise per Week: 4 days  . Minutes of Exercise per Session: 20 min  Stress: No Stress Concern Present  . Feeling of Stress : Only a little  Social Connections: Socially Isolated  . Frequency of Communication with Friends and Family: Once a week  . Frequency of Social Gatherings with Friends and Family: Never  . Attends Religious Services: Never  . Active Member of Clubs or Organizations: No  . Attends BankerClub or Organization Meetings: Never  . Marital Status: Never married    Tobacco Counseling Counseling given: Not Answered   Clinical Intake:  Pre-visit preparation completed: Yes  Pain : 0-10 Pain  Score: 6  Pain Type: Chronic pain Pain Location: Generalized (fibromyalgia) Pain Onset: More than a month ago Pain Frequency: Constant     BMI - recorded: 32.06 Nutritional Status: BMI > 30  Obese Nutritional Risks: None Diabetes: No  How often do you need to have someone help you when you read instructions, pamphlets, or other written materials from your doctor or pharmacy?: 1 - Never    Interpreter Needed?: Yes Interpreter Agency: Runner, broadcasting/film/video ID: [id number] Patient Declined Interpreter : No  Information entered by :: Reather Littler LPN   Activities of Daily Living In your present state of health, do you have any difficulty performing the following activities: 01/26/2021 01/20/2021  Hearing? Y Y  Comment requires sign language interpreter -  Vision? N N  Comment - -  Difficulty concentrating or making decisions? N N  Walking or climbing stairs? Y Y  Dressing or bathing? N N  Comment - -  Doing errands, shopping? N N  Preparing Food and eating ? N -  Using the Toilet? N -  In the past six months, have you accidently leaked urine? N -  Do you have problems with loss of  bowel control? N -  Managing your Medications? N -  Managing your Finances? N -  Housekeeping or managing your Housekeeping? N -  Some recent data might be hidden    Patient Care Team: Alba Cory, MD as PCP - General (Family Medicine) Iran Ouch, MD as PCP - Cardiology (Cardiology) [place] Ouch, MD as Consulting Physician (Cardiology) Hildred Laser, MD as Referring Physician (Obstetrics and Gynecology) Morene Crocker, MD as Referring Physician (Neurology) Jomarie Longs, MD as Consulting Physician (Psychiatry)  Indicate any recent Medical Services you may have received from other than [facility] providers in the past year (date may be approximate).     Assessment:   This is a routine wellness examination for [place].  Hearing/Vision screen  Hearing Screening             Right ear:           Left ear:           Comments: Pt is deaf; uses VRS to communicate via phone.   Vision Screening Comments: [facility]  Dietary issues and exercise activities discussed: Current Exercise Habits: Home exercise routine, Type of exercise: Other - see comments (exercise bike), Time (Minutes): 20, Frequency (Times/Week): 4, Weekly Exercise (Minutes/Week): 80, Intensity: Mild, Exercise limited by: neurologic condition(s);orthopedic condition(s)  Goals    . 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 Go to Review Flowsheets activity to see all data.    Fall Risk Fall Risk  01/26/2021 01/20/2021 10/09/2020 04/08/2020 01/28/2020  Falls in the past year? 1 - 1 0 1  Number falls in past yr: 0 0 - 0 1  Comment slipped in snow - - -  [place]  Injury with Fall? 0 0 0 0 1  Comment - - - - ankle  Risk Factor Category  - - - - -  Risk for fall due to : No Fall  Risks - - - -  Follow up Falls prevention discussed - - Falls evaluation completed -    FALL RISK PREVENTION PERTAINING TO THE HOME:  Any stairs in or around the home? Yes  If so, are there any without handrails? No  Home free of loose throw rugs in walkways, pet beds, electrical cords, etc? Yes  Adequate lighting in your home to reduce risk of falls? Yes   ASSISTIVE DEVICES UTILIZED TO PREVENT FALLS:  Life alert? No  Use of a cane, walker or w/c? No  Grab bars in the bathroom? Yes  Shower chair or bench in shower? Yes  Elevated toilet seat or a handicapped toilet? No   TIMED UP AND GO:  Was the test performed? No . Telephonic visit.   Cognitive Function: Normal cognitive status assessed by direct observation by this Nurse Health Advisor. No abnormalities found.          Immunizations Immunization History  Administered Date(s) Administered  . Influenza,inj,Quad PF,6+ Mos 07/20/2015, 09/01/2017, 09/25/2018, 08/09/2019, 09/08/2020  . Influenza-Unspecified 09/12/2014, 08/24/2016  . PFIZER(Purple Top)SARS-COV-2 Vaccination 03/03/2020, 03/24/2020, 10/30/2020  . Pneumococcal Conjugate-13 11/24/2014  . Pneumococcal Polysaccharide-23 11/24/2014  . Tdap 11/28/2009, 03/16/2016    TDAP status: Up to date  Flu Vaccine status: Up to date  Pneumococcal vaccine status: Up to date  Covid-19 vaccine status: Completed vaccines  Qualifies for Shingles Vaccine? No  due age 33  Screening Tests Health Maintenance  Topic Date Due  . PAP SMEAR-Modifier  02/01/2021 (Originally 04/11/2020)  . TETANUS/TDAP  03/16/2026  . INFLUENZA VACCINE  Completed  . COVID-19 Vaccine  Completed  . Hepatitis C Screening  Completed  . HIV Screening  Completed  . HPV VACCINES  Aged Out    Health Maintenance  There are no preventive care reminders to display for  this patient. Colorectal cancer screening: due age 63  Mammogram status: due age 59  Bone density screening: due age 55  Lung Cancer Screening: (Low Dose CT Chest recommended if Age 21-80 years, 30 pack-year currently smoking OR have quit w/in 15years.) does not qualify.   Additional Screening:  Hepatitis C Screening: does qualify; Completed 10/09/20  Vision Screening: Recommended annual ophthalmology exams for early detection of glaucoma and other disorders of the eye. Is the patient up to date with their annual eye exam?  Yes  Who is the provider or what is the name of the office in which the patient attends annual eye exams? [facility]  Dental Screening: Recommended annual dental exams for proper oral hygiene  Community Resource Referral / Chronic Care Management: CRR required this visit?  Yes - transportation  CCM required this visit?  No      Plan:     I have personally reviewed and noted the following in the patient's chart:   . Medical and social history . Use of alcohol, tobacco or illicit drugs  . Current medications and supplements . Functional ability and status . Nutritional status . Physical activity . Advanced directives . List of other physicians . Hospitalizations, surgeries, and ER visits in previous 12 months . Vitals . Screenings to include cognitive, depression, and falls . Referrals and appointments  In addition, I have reviewed and discussed with patient certain preventive protocols, quality metrics, and best practice recommendations. A written personalized care plan for preventive services as well as general preventive health recommendations were provided to patient.     Reather Littler, LPN   0/07/8118   Nurse Notes: pt c/o  fibromyalgia flare up. Pt states she may be interested in new referral for different neurologist due for possible different treatment plan. She is also interested in referral to rhematology for better management of  fibromyalgia. Pt advised to discuss at next office visit with Dr. Carlynn Purl.

## 2021-01-28 ENCOUNTER — Telehealth: Payer: Self-pay

## 2021-01-28 NOTE — Telephone Encounter (Signed)
   Telephone encounter was:  Unsuccessful.  01/28/2021 Name: Yesenia Wood MRN: [id number] DOB: 1986/09/09  Unsuccessful outbound call made today to assist with:  Transportation Needs  and Left message with interpreter TTY service for patient to return my call regarding transportation needs.  Outreach Attempt:  1st Attempt  A HIPAA compliant voice message was left requesting a return call.  Instructed patient to call back at [phone]  Ricarda Frame, AAS Paralegal, [facility] Care Guide . Embedded Care Coordination [facility]  Care Management  [address] [place], [place] [address] ??[email or web address]  ?? [phone]   [email or web address]

## 2021-02-02 ENCOUNTER — Ambulatory Visit (INDEPENDENT_AMBULATORY_CARE_PROVIDER_SITE_OTHER): Payer: Medicare Other | Admitting: Obstetrics and Gynecology

## 2021-02-02 ENCOUNTER — Other Ambulatory Visit: Payer: Self-pay

## 2021-02-02 ENCOUNTER — Encounter: Payer: Self-pay | Admitting: Obstetrics and Gynecology

## 2021-02-02 VITALS — BP 99/68 | HR 74 | Ht 63.0 in | Wt 189.6 lb

## 2021-02-02 DIAGNOSIS — R7989 Other specified abnormal findings of blood chemistry: Secondary | ICD-10-CM | POA: Diagnosis not present

## 2021-02-02 DIAGNOSIS — N938 Other specified abnormal uterine and vaginal bleeding: Secondary | ICD-10-CM

## 2021-02-02 DIAGNOSIS — R102 Pelvic and perineal pain: Secondary | ICD-10-CM | POA: Diagnosis not present

## 2021-02-02 NOTE — Patient Instructions (Signed)
Endometrial Ablation Endometrial ablation is a procedure that destroys the thin inner layer of the lining of the uterus (endometrium). This procedure may be done:  To stop heavy menstrual periods.  To stop bleeding that is causing anemia.  To control irregular bleeding.  To treat bleeding caused by small tumors (fibroids) in the endometrium. This procedure is often done as an alternative to major surgery, such as removal of the uterus and cervix (hysterectomy). As a result of this procedure:  You may not be able to have children. However, if you have not yet gone through menopause: ? You may still have a small chance of getting pregnant. ? You will need to use a reliable method of birth control after the procedure to prevent pregnancy.  You may stop having a menstrual period, or you may have only a small amount of bleeding during your period. Menstruation may return several years after the procedure. Tell a health care provider about:  Any allergies you have.  All medicines you are taking, including vitamins, herbs, eye drops, creams, and over-the-counter medicines.  Any problems you or family members have had with the use of anesthetic medicines.  Any blood disorders you have.  Any surgeries you have had.  Any medical conditions you have.  Whether you are pregnant or may be pregnant. What are the risks? Generally, this is a safe procedure. However, problems may occur, including:  A hole (perforation) in the uterus or bowel.  Infection in the uterus, bladder, or vagina.  Bleeding.  Allergic reaction to medicines.  Damage to nearby structures or organs.  An air bubble in the lung (air embolus).  Problems with pregnancy.  Failure of the procedure.  Decreased ability to diagnose cancer in the endometrium. Scar tissue forms after the procedure, making it more difficult to get a sample of the uterine lining. What happens before the procedure? Medicines Ask your health  care provider about:  Changing or stopping your regular medicines. This is especially important if you take diabetes medicines or blood thinners.  Taking medicines such as aspirin and ibuprofen. These medicines can thin your blood. Do not take these medicines before your procedure if your doctor tells you not to take them.  Taking over-the-counter medicines, vitamins, herbs, and supplements. Tests  You will have tests of your endometrium to make sure there are no precancerous cells or cancer cells present.  You may have an ultrasound of the uterus. General instructions  Do not use any products that contain nicotine or tobacco for at least 4 weeks before the procedure. These include cigarettes, chewing tobacco, and vaping devices, such as e-cigarettes. If you need help quitting, ask your health care provider.  You may be given medicines to thin the endometrium.  Ask your health care provider what steps will be taken to help prevent infection. These steps may include: ? Removing hair at the surgery site. ? Washing skin with a germ-killing soap. ? Taking antibiotic medicine.  Plan to have a responsible adult take you home from the hospital or clinic.  Plan to have a responsible adult care for you for the time you are told after you leave the hospital or clinic. This is important. What happens during the procedure?  You will lie on an exam table with your feet and legs supported as in a pelvic exam.  An IV will be inserted into one of your veins.  You will be given a medicine to help you relax (sedative).  A surgical tool with   a light and camera (resectoscope) will be inserted into your vagina and moved into your uterus. This allows your surgeon to see inside your uterus.  Endometrial tissue will be destroyed and removed, using one of the following methods: ? Radiofrequency. This uses an electrical current to destroy the endometrium. ? Cryotherapy. This uses extreme cold to freeze  the endometrium. ? Heated fluid. This uses a heated salt and water (saline) solution to destroy the endometrium. ? Microwave. This uses high-energy microwaves to heat up the endometrium and destroy it. ? Thermal balloon. This involves inserting a catheter with a balloon tip into the uterus. The balloon tip is filled with heated fluid to destroy the endometrium. The procedure may vary among health care providers and hospitals.   What happens after the procedure?  Your blood pressure, heart rate, breathing rate, and blood oxygen level will be monitored until you leave the hospital or clinic.  You may have vaginal bleeding for 4-6 weeks after the procedure. You may also have: ? Cramps. ? A thin, watery vaginal discharge that is light pink or brown. ? A need to urinate more than usual. ? Nausea.  If you were given a sedative during the procedure, it can affect you for several hours. Do not drive or operate machinery until your health care provider says that it is safe.  Do not have sex or insert anything into your vagina until your health care provider says it is safe. Summary  Endometrial ablation is done to treat many causes of heavy menstrual bleeding. The procedure destroys the thin inner layer of the lining of the uterus (endometrium).  This procedure is often done as an alternative to major surgery, such as removal of the uterus and cervix (hysterectomy).  Plan to have a responsible adult take you home from the hospital or clinic. This information is not intended to replace advice given to you by your health care provider. Make sure you discuss any questions you have with your health care provider. Document Revised: 06/04/2020 Document Reviewed: 06/04/2020 Elsevier Patient Education  2021 Elsevier Inc[employer]   Dysfunctional Uterine Bleeding Dysfunctional uterine bleeding is abnormal bleeding from the uterus. Dysfunctional uterine bleeding includes:  A menstrual period that comes earlier  or later than usual.  A menstrual period that is lighter or heavier than usual, or has large blood clots.  Vaginal bleeding between menstrual periods.  Skipping one or more menstrual periods.  Vaginal bleeding after sex.  Vaginal bleeding after menopause. Follow these instructions at home: Eating and drinking  Eat well-balanced meals. Include foods that are high in iron, such as liver, meat, shellfish, green leafy vegetables, and eggs.  To prevent or treat constipation, your health care provider may recommend that you: ? Drink enough fluid to keep your urine pale yellow. ? Take over-the-counter or prescription medicines. ? Eat foods that are high in fiber, such as beans, whole grains, and fresh fruits and vegetables. ? Limit foods that are high in fat and processed sugars, such as fried or sweet foods.   Medicines  Take over-the-counter and prescription medicines only as told by your health care provider.  Do not change medicines without talking with your health care provider.  Aspirin or medicines that contain aspirin may make the bleeding worse. Do not take those medicines: ? During the week before your menstrual period. ? During your menstrual period.  If you were prescribed iron pills, take them as told by your health care provider. Iron pills help to replace iron  that your body loses because of this condition. Activity  If you need to change your sanitary pad or tampon more than one time every 2 hours: ? Lie in bed with your feet raised (elevated). ? Place a cold pack on your lower abdomen. ? Rest as much as possible until the bleeding stops or slows down.  Do not try to lose weight until the bleeding has stopped and your blood iron level is back to normal. General instructions  For two months, write down: ? When your menstrual period starts. ? When your menstrual period ends. ? When any abnormal vaginal bleeding occurs. ? What problems you notice.  Keep all  follow up visits as told by your health care provider. This is important.   Contact a health care provider if you:  Feel light-headed or weak.  Have nausea and vomiting.  Cannot eat or drink without vomiting.  Feel dizzy or have diarrhea while you are taking medicines.  Are taking birth control pills or hormones, and you want to change them or stop taking them. Get help right away if:  You develop a fever or chills.  You need to change your sanitary pad or tampon more than one time per hour.  Your vaginal bleeding becomes heavier, or your flow contains clots more often.  You develop pain in your abdomen.  You lose consciousness.  You develop a rash. Summary  Dysfunctional uterine bleeding is abnormal bleeding from the uterus.  It includes menstrual bleeding of abnormal duration, volume, or regularity.  Bleeding after sex and after menopause are also considered dysfunctional uterine bleeding. This information is not intended to replace advice given to you by your health care provider. Make sure you discuss any questions you have with your health care provider. Document Revised: 04/25/2018 Document Reviewed: 04/25/2018 Elsevier Patient Education  2021 [employer].

## 2021-02-02 NOTE — Progress Notes (Unsigned)
GYNECOLOGY PROGRESS NOTE  Subjective:    Patient ID: Yesenia Wood, female    DOB: 17-Mar-1986, 35 y.o.   MRN: [id number]  ASL in-person interpreter used for today for encounter as patient is hearing impaired.   HPI  Patient is a 35 y.o. G0P0000 female who presents for 3 month follow up of DUB.She was initiated on Aygestin 6 month ago.  She complains that she is still having some dysfunctional bleeding.  She reports that she stated that her cycle started on 12/24/2020 and ended on 01/16/2021. Pt stated having extreme pain that she is unable to stand up straight.  Also is noting some fatigue.   Reports that she has done some tests at home (did a swab). Reports a low testosterone level. Would like to have testing. Wonders if this has any affects on her abnormal cycles.  Notes that she has started taking iron to help with her dizziness during her cycles.  This does help.   Lastly, wonders if she is able to take her Aygestin at different times of the day. Notes that she takes the 10 mg in the evening time, but does note some intermittent spotting during the day. Would like to split dosing to BID (5 mg in a.m. and the other 5 mg in p.m.).    The following portions of the patient's history were reviewed and updated as appropriate: allergies, current medications, past family history, past medical history, past social history, past surgical history and problem list.  Review of Systems Pertinent items noted in HPI and remainder of comprehensive ROS otherwise negative.   Objective:   Blood pressure 99/68, pulse 74, height 5\' 3"  (1.6 m), weight 189 lb 9.6 oz (86 kg), last menstrual period 12/24/2020. General appearance: alert and no distress Abdomen: soft, non-tender; bowel sounds normal; no masses,  no organomegaly Pelvic: deferred.   Assessment:   1. DUB (dysfunctional uterine bleeding)   2. Elevated testosterone level   3. Pelvic pain    Plan:   1.  Patient with persistent  dysfunctional uterine bleeding currently on Aygestin. She has tried multiple interventions to date, including several different OCPs, IUD, Depo-Provera, and a trial of 12/26/2020.  She also had a work-up for her abnormal bleeding and dysmenorrhea in the past, however that work-up was negative for endometriosis.  Again reiterated limitations as patient has tried multiple options, only other options available include hormonal suppression with  danazol, Depo-Lupron (if insurance allows), or consideration of surgical intervention (definitive with hysterectomy, or could consider ablation as childbearing not desired and has BTL, however due to current age may have further issues with bleeding recurrence as longevity of procedure is averaged at 7-10 years. Also discussed risk of post ablation sterilization syndrome).  Patient notes that she may consider surgical intervention with ablation.  Given handout on ablation procedure.   2. Advised that she could take Aygestin as desired (split dosing) to see if this will help with spotting.  To f/u again in 3 months for reassessment. Take continuously for 3 months. If still with breakthrough bleeding, can decrease to 21 days on/7 days off regimen after 3  months.  3. Desires testosterone levels to be drawn due to positive home testing. Will order. 4. Unclear if pelvic pain is related to endometriosis or other cause. Takes NSAIDs as needed.    A total of 20 minutes were spent face-to-face with the patient during this encounter and over half of that time dealt with counseling and coordination of  care.   Rubie Maid, MD [facility]

## 2021-02-02 NOTE — Progress Notes (Signed)
Pt present for follow up due to abnormal bleeding. Pt stated that her cycle started on 12/24/2020 and ended on 01/16/2021. Pt stated having extreme pain that she is unable to stand up straight.

## 2021-02-03 ENCOUNTER — Encounter: Payer: Self-pay | Admitting: Obstetrics and Gynecology

## 2021-02-03 ENCOUNTER — Telehealth: Payer: Self-pay

## 2021-02-03 DIAGNOSIS — G43719 Chronic migraine without aura, intractable, without status migrainosus: Secondary | ICD-10-CM | POA: Diagnosis not present

## 2021-02-03 DIAGNOSIS — G43109 Migraine with aura, not intractable, without status migrainosus: Secondary | ICD-10-CM | POA: Diagnosis not present

## 2021-02-03 DIAGNOSIS — G518 Other disorders of facial nerve: Secondary | ICD-10-CM | POA: Diagnosis not present

## 2021-02-03 DIAGNOSIS — M791 Myalgia, unspecified site: Secondary | ICD-10-CM | POA: Diagnosis not present

## 2021-02-03 DIAGNOSIS — M542 Cervicalgia: Secondary | ICD-10-CM | POA: Diagnosis not present

## 2021-02-03 DIAGNOSIS — G43111 Migraine with aura, intractable, with status migrainosus: Secondary | ICD-10-CM | POA: Diagnosis not present

## 2021-02-03 NOTE — Telephone Encounter (Signed)
   Telephone encounter was:  Unsuccessful.  02/03/2021 Name: Yesenia Wood MRN: [id number] DOB: 11/23/1986  Unsuccessful outbound call made today to assist with:  Transportation Needs  and Follow-up call to patient to ensure she received paperwork from [facility] transportation. Left message through ASL interpreter to have patient return my call.  Outreach Attempt:  2nd Attempt  A HIPAA compliant voice message was left requesting a return call.  Instructed patient to call back at [phone].  Milicent Adams, AAS Paralegal, [facility] Care Guide . Embedded Care Coordination [facility]  Care Management  [address] [place], [place] [address] ??[email or web address]  ?? [phone]   [email or web address]

## 2021-02-04 LAB — TESTOSTERONE, FREE, TOTAL, SHBG
Sex Hormone Binding: 10.9 nmol/L — ABNORMAL LOW (ref 24.6–122.0)
Testosterone, Free: 3.3 pg/mL (ref 0.0–4.2)
Testosterone: 39 ng/dL (ref 8–60)

## 2021-02-05 ENCOUNTER — Telehealth: Payer: Self-pay

## 2021-02-05 NOTE — Telephone Encounter (Signed)
   Telephone encounter was:  Successful.  02/05/2021 Name: Yesenia Wood MRN: [id number] DOB: 01-20-1986  [name] is a 35 y.o. year old female who is a primary care patient of Alba Cory, MD . The community resource team was consulted for assistance with Transportation Needs   Care guide performed the following interventions: Follow up call placed to the patient to discuss status of referral Patient has received the paperwork from [facility] and will sign and return in the self addressed envelope.    Emailed reminder about contacting [facility] for transportation. .  Follow Up Plan:  No further follow up planned at this time. The patient has been provided with needed resources.  Alden Bensinger, AAS Paralegal, [facility] Care Guide . Embedded Care Coordination [facility] Health  Care Management  [address] [place], [place] [address] ??[name]  ?? [phone]   [email or web address]

## 2021-02-16 ENCOUNTER — Telehealth: Payer: Self-pay | Admitting: Obstetrics and Gynecology

## 2021-02-16 NOTE — Telephone Encounter (Signed)
Was unable to get 7:30 start time for this surgery but did get it scheduled for 12:00pm on 03-05-2021. Is this ok?

## 2021-02-16 NOTE — Telephone Encounter (Signed)
I guess that's fine.

## 2021-02-17 ENCOUNTER — Other Ambulatory Visit: Payer: Self-pay

## 2021-02-17 ENCOUNTER — Ambulatory Visit (INDEPENDENT_AMBULATORY_CARE_PROVIDER_SITE_OTHER): Payer: Medicare Other | Admitting: Licensed Clinical Social Worker

## 2021-02-17 DIAGNOSIS — F4010 Social phobia, unspecified: Secondary | ICD-10-CM | POA: Diagnosis not present

## 2021-02-17 DIAGNOSIS — F331 Major depressive disorder, recurrent, moderate: Secondary | ICD-10-CM | POA: Diagnosis not present

## 2021-02-17 NOTE — Progress Notes (Signed)
Virtual Visit via Video Note  I connected with Yesenia Wood on 02/17/21 at 11:00 AM EDT by a video enabled telemedicine application and verified that I am speaking with the correct person using two identifiers.  Location: Patient: home Provider: [facility]   I discussed the limitations of evaluation and management by telemedicine and the availability of in person appointments. The patient expressed understanding and agreed to proceed.   I discussed the assessment and treatment plan with the patient. The patient was provided an opportunity to ask questions and all were answered. The patient agreed with the plan and demonstrated an understanding of the instructions.   The patient was advised to call back or seek an in-person evaluation if the symptoms worsen or if the condition fails to improve as anticipated.  I provided 45 minutes of non-face-to-face time during this encounter.   Christina R Hussami, LCSW     THERAPIST PROGRESS NOTE  Session Time: 11-11:45a  Participation Level: Active  Behavioral Response: NAAlertAnxious  Type of Therapy: Individual Therapy  Treatment Goals addressed: Anxiety  Interventions: CBT and Supportive  Summary: Yesenia Wood is a 35 y.o. female who presents with symptoms consistent with depression and social anxiety. Pt reports that overall mood has felt a little lower recently and that anxiety levels have been higher due to the amount of things on pts plate that she is worrying about. Pt reports that she is worrying about surgical procedure she is having in April and a procedure that her father is having in April.   Pt reports that she wants to lose weight but is not making any behavior changes in the moment to help with this. "my doctor stated that I should be able to see a difference around 6 months after my procedure in April".  Discussed some behavior changes that could help her see results before that time frame. Pt states that she will discuss  with MD but not enthusiastic about making any significant behavior changes.  Continued recommendations are as follows: self care behaviors, positive social engagements, focusing on overall work/home/life balance, and focusing on positive physical and emotional wellness.   Suicidal/Homicidal: No  Therapist Response: Yesenia Wood is reporting that symptoms are not improving, but are not worse. [name] reports that she really is not trying to increase physical activity or has made any significant changes in behaviors since last session. This is reflective of fluctuating/intermittent progress. Treatment to continue.  Plan: Return again in 4 weeks.  Diagnosis: Axis I: Major Depression, Recurrent severe and Social Anxiety    Axis II: No diagnosis    Ernest Haber Hussami, LCSW 02/17/2021

## 2021-02-19 ENCOUNTER — Other Ambulatory Visit: Payer: Self-pay | Admitting: Obstetrics and Gynecology

## 2021-02-25 ENCOUNTER — Other Ambulatory Visit: Payer: Self-pay

## 2021-02-25 ENCOUNTER — Other Ambulatory Visit: Payer: Self-pay | Admitting: Obstetrics and Gynecology

## 2021-02-25 ENCOUNTER — Other Ambulatory Visit
Admission: RE | Admit: 2021-02-25 | Discharge: 2021-02-25 | Disposition: A | Discharge status: Home / Self Care | Payer: Medicare Other | Source: Ambulatory Visit | Attending: Family Medicine | Admitting: Family Medicine

## 2021-02-25 NOTE — Patient Instructions (Signed)
Your procedure is scheduled on: Friday 03/05/21.  Report to THE FIRST FLOOR REGISTRATION DESK IN THE MEDICAL MALL ON THE MORNING OF SURGERY FIRST, THEN YOU WILL CHECK IN AT THE SURGERY INFORMATION DESK LOCATED OUTSIDE THE SAME DAY SURGERY DEPARTMENT LOCATED ON 2ND FLOOR MEDICAL MALL ENTRANCE.  To find out your arrival time please call [phone] between 1PM - 3PM on Thursday 03/04/21.   Remember: Instructions that are not followed completely may result in serious medical risk, up to and including death, or upon the discretion of your surgeon and anesthesiologist your surgery may need to be rescheduled.     __X__ 1. Do not eat food after midnight the night before your procedure.                 No gum chewing or hard candies. You may drink clear liquids up to 2 hours                 before you are scheduled to arrive for your surgery- DO NOT drink clear                 liquids within 2 hours of the start of your surgery.                 Clear Liquids include:  water, apple juice without pulp, clear carbohydrate                 drink such as Clearfast or Gatorade, Black Coffee or Tea (Do not add                 milk or creamer to coffee or tea).  __X__2.  On the morning of surgery brush your teeth with toothpaste and water, you may rinse your mouth with mouthwash if you wish.  Do not swallow any toothpaste or mouthwash.    __X__ 3.  No Alcohol for 24 hours before or after surgery.  __X__ 4.  Do Not Smoke or use e-cigarettes For 24 Hours Prior to Your Surgery.                 Do not use any chewable tobacco products for at least 6 hours prior to                 surgery.  __X__5.  Notify your doctor if there is any change in your medical condition      (cold, fever, infections).      Do NOT wear jewelry, make-up, hairpins, clips or nail polish. Do NOT wear lotions, powders, or perfumes.  Do NOT shave 48 hours prior to surgery. Men may shave face and neck. Do NOT bring valuables to the  hospital.     [facility] is not responsible for any belongings or valuables.   Contacts, dentures/partials or body piercings may not be worn into surgery. Bring a case for your contacts, glasses or hearing aids, a denture cup will be supplied. Leave your suitcase in the car. After surgery it may be brought to your room.   For patients admitted to the hospital, discharge time is determined by your treatment team.    Patients discharged the day of surgery will not be allowed to drive home.     __X__ Take these medicines the morning of surgery with A SIP OF WATER:     1. acebutolol (SECTRAL)  2. norethindrone (AYGESTIN) if you usually take this medication in the mornings.  3. rosuvastatin (CRESTOR) if you usually take  this medication in the mornings.     __X__ Stop Anti-inflammatories 7 days before surgery such as Advil, Ibuprofen, Motrin, BC or Goodies Powder, Naprosyn, Naproxen, Aleve, Aspirin, Meloxicam. May take Tylenol if needed for pain or discomfort.   __X__Do not start taking any new herbal supplements or vitamins prior to your procedure.     Wear comfortable clothing (specific to your surgery type) to the hospital.  Plan for stool softeners for home use; pain medications have a tendency to cause constipation. You can also help prevent constipation by eating foods high in fiber such as fruits and vegetables and drinking plenty of fluids as your diet allows.  After surgery, you can prevent lung complications by doing breathing exercises.Take deep breaths and cough every 1-2 hours. Your doctor may order a device called an Incentive Spirometer to help you take deep breaths.  Please call the Pre-Admissions Testing Department at [phone] if you have any questions about these instructions.

## 2021-03-03 ENCOUNTER — Encounter: Payer: Self-pay | Admitting: Obstetrics and Gynecology

## 2021-03-03 ENCOUNTER — Encounter: Payer: Self-pay | Admitting: Surgical

## 2021-03-03 ENCOUNTER — Ambulatory Visit (INDEPENDENT_AMBULATORY_CARE_PROVIDER_SITE_OTHER): Payer: Medicare Other | Admitting: Obstetrics and Gynecology

## 2021-03-03 ENCOUNTER — Other Ambulatory Visit
Admission: RE | Admit: 2021-03-03 | Discharge: 2021-03-03 | Disposition: A | Discharge status: Home / Self Care | Payer: Medicare Other | Source: Ambulatory Visit | Attending: Obstetrics and Gynecology | Admitting: Obstetrics and Gynecology

## 2021-03-03 ENCOUNTER — Other Ambulatory Visit: Payer: Self-pay

## 2021-03-03 VITALS — BP 121/84 | HR 70 | Ht 63.0 in | Wt 188.8 lb

## 2021-03-03 DIAGNOSIS — Z01818 Encounter for other preprocedural examination: Secondary | ICD-10-CM | POA: Diagnosis not present

## 2021-03-03 DIAGNOSIS — Z20822 Contact with and (suspected) exposure to covid-19: Secondary | ICD-10-CM | POA: Diagnosis not present

## 2021-03-03 DIAGNOSIS — N938 Other specified abnormal uterine and vaginal bleeding: Secondary | ICD-10-CM

## 2021-03-03 DIAGNOSIS — N946 Dysmenorrhea, unspecified: Secondary | ICD-10-CM

## 2021-03-03 DIAGNOSIS — Z01810 Encounter for preprocedural cardiovascular examination: Secondary | ICD-10-CM | POA: Diagnosis not present

## 2021-03-03 LAB — CBC
HCT: 43.2 % (ref 36.0–46.0)
Hemoglobin: 14.4 g/dL (ref 12.0–15.0)
MCH: 28.2 pg (ref 26.0–34.0)
MCHC: 33.3 g/dL (ref 30.0–36.0)
MCV: 84.7 fL (ref 80.0–100.0)
Platelets: 317 10*3/uL (ref 150–400)
RBC: 5.1 MIL/uL (ref 3.87–5.11)
RDW: 14.4 % (ref 11.5–15.5)
WBC: 5.8 10*3/uL (ref 4.0–10.5)
nRBC: 0 % (ref 0.0–0.2)

## 2021-03-03 LAB — BASIC METABOLIC PANEL
Anion gap: 7 (ref 5–15)
BUN: 9 mg/dL (ref 6–20)
CO2: 24 mmol/L (ref 22–32)
Calcium: 8.6 mg/dL — ABNORMAL LOW (ref 8.9–10.3)
Chloride: 105 mmol/L (ref 98–111)
Creatinine, Ser: 0.88 mg/dL (ref 0.44–1.00)
GFR, Estimated: >60 mL/min (ref >60)
Glucose, Bld: 80 mg/dL (ref 70–99)
Potassium: 3.8 mmol/L (ref 3.5–5.1)
Sodium: 136 mmol/L (ref 135–145)

## 2021-03-03 LAB — SARS CORONAVIRUS 2 (TAT 6-24 HRS): SARS Coronavirus 2: NEGATIVE

## 2021-03-03 NOTE — Progress Notes (Signed)
Pt present for pre-op exam. Pt stated that she is hopeful that the surgery helps.

## 2021-03-03 NOTE — H&P (View-Only) (Signed)
@[place] @   GYNECOLOGY PREOPERATIVE HISTORY AND PHYSICAL   Subjective:  Yesenia Wood is a 35 y.o. G0P0000 deaf female here for surgical management of abnormal uterine bleeding and dysmenorrhea.  She has tried several interventions to date, including OCPs (combined and continuous methods),  Mirena IUD, Orilissa, and Aygestin (which she is currently taking). She underwent laparoscopic evaluation for endometriosis during tubal ligation surgery with negative biopsies.  Declines definitive therapy with hysterectomy at this time. No significant preoperative concerns.  Proposed surgery: Hysteroscopy D&C with endometrial ablation ([place])   Pertinent Gynecological History: Menses: flow isirregular with intermenstrual spotting/bleeding pads Bleeding: dysfunctional uterine bleeding Contraception: tubal ligation Last pap: normal Date: 04/11/2017   Past Medical History:  Diagnosis Date  . ADHD (attention deficit hyperactivity disorder)   . B12 deficiency   . Bell's palsy    right sided  . Chest pain    a. 05/2015 ETT: elev HR/BP, but no acute ST/T changes; b. 09/2019 Cor CTA: Nl cors, Ca2+ = 0.  . Chronic migraine   . Deaf   . Depression   . 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 Fatigue   . Fatty liver   . Fibromyalgia   . GERD (gastroesophageal reflux disease)   . Headache   . High triglycerides   . Hyperglycemia   . Hypertension   . Hyperthyroidism   . Moderate asthma   . Obesity   . Paresthesia   . Premature birth     Past Surgical History:  Procedure Laterality Date  . BARTHOLIN GLAND CYST REMOVAL    . COCHLEAR IMPLANT    . COCHLEAR IMPLANT REMOVAL    . LAPAROSCOPIC TUBAL LIGATION Bilateral 03/26/2018   Procedure: LAPAROSCOPIC TUBAL LIGATION;  Surgeon: 03/28/2018, MD;  Location: [facility] ORS;  Service: Gynecology;  Laterality: Bilateral;  with peritoneal biopsies  . LAPAROSCOPY    . MYRINGOTOMY WITH TUBE PLACEMENT     . TUBAL LIGATION  03/26/2018   OB History  Gravida Para Term Preterm AB Living  0 0 0 0 0 0  SAB IAB Ectopic Multiple Live Births  0 0 0 0 0    Family History  Adopted: Yes  Family history unknown: Yes    Social History   Socioeconomic History  . Marital status: Single    Spouse name: Not on file  . Number of children: 0  . Years of education: Not on file  . Highest education level: Some college, no degree  Occupational History  . Occupation: disability for hearing loss  Tobacco Use  . Smoking status: Former Smoker    Packs/day: 1.00    Years: 2.00    Pack years: 2.00    Types: Cigarettes    Start date: 11/28/2004    Quit date: 11/28/2006    Years since quitting: 14.2  . Smokeless tobacco: Never Used  Vaping Use  . Vaping Use: Never used  Substance and Sexual Activity  . Alcohol use: Yes    Alcohol/week: 0.0 standard drinks    Comment: occasional   . Drug use: No  . Sexual activity: Not Currently    Birth control/protection: Pill    Comment: Seasonale  Other Topics Concern  . Not on file  Social History Narrative   Lives with her adopted  parents   Hearing loss since birth    Social Determinants of Health   Financial Resource Strain: Low Risk   . Difficulty of Paying Living Expenses: Not  very hard  Food Insecurity: No Food Insecurity  . Worried About Programme researcher, broadcasting/film/videounning Out of Food in the Last Year: Never true  . Ran Out of Food in the Last Year: Never true  Transportation Needs: Unmet Transportation Needs  . Lack of Transportation (Medical): Yes  . Lack of Transportation (Non-Medical): No  Physical Activity: Insufficiently Active  . Days of Exercise per Week: 4 days  . Minutes of Exercise per Session: 20 min  Stress: No Stress Concern Present  . Feeling of Stress : Only a little  Social Connections: Socially Isolated  . Frequency of Communication with Friends and Family: Once a week  . Frequency of Social Gatherings with Friends and Family: Never  . Attends  Religious Services: Never  . Active Member of Clubs or Organizations: No  . Attends BankerClub or Organization Meetings: Never  . Marital Status: Never married  Intimate Partner Violence: Not At Risk  . Fear of Current or Ex-Partner: No  . Emotionally Abused: No  . Physically Abused: No  . Sexually Abused: No    Current Outpatient Medications on File Prior to Visit  Medication Sig Dispense Refill  . acebutolol (SECTRAL) 200 MG capsule TAKE 1 CAPSULE BY MOUTH TWICE A DAY (Patient taking differently: Take 200 mg by mouth 2 (two) times daily.) 180 capsule 3  . acetaminophen (TYLENOL) 500 MG tablet Take 500 mg by mouth every 6 (six) hours as needed for moderate pain.    Marland Kitchen. BOTOX 100 units SOLR injection every 3 (three) months.     . calcium carbonate (TUMS - DOSED IN MG ELEMENTAL CALCIUM) 500 MG chewable tablet Chew 1 tablet by mouth daily as needed for indigestion or heartburn.    . Cyanocobalamin (VITAMIN B-12) 500 MCG SUBL Place 1 tablet (500 mcg total) under the tongue once a week. 30 tablet 0  . diphenhydrAMINE (BENADRYL) 50 MG tablet Take 50 mg by mouth at bedtime as needed for itching.    . docusate sodium (COLACE) 100 MG capsule Take 100 mg by mouth daily as needed for mild constipation.    . ferrous sulfate 325 (65 FE) MG tablet Take 325 mg by mouth daily with breakfast.    . folic acid (FOLVITE) 800 MCG tablet Take 800 mcg by mouth daily. 800 mcg    . norethindrone (AYGESTIN) 5 MG tablet TAKE 2 TABLETS BY MOUTH EVERY DAY 60 tablet 0  . rosuvastatin (CRESTOR) 20 MG tablet Take 1 tablet (20 mg total) by mouth daily. 90 tablet 1  . simethicone (MYLICON) 125 MG chewable tablet Chew 125 mg by mouth every 6 (six) hours as needed for flatulence.     No current facility-administered medications on file prior to visit.   Allergies  Allergen Reactions  . Budesonide-Formoterol Fumarate Anaphylaxis and Other (See Comments)    Other reaction(s): Other (See Comments) chest pain Chest pain chest  pain  . Astelin [Azelastine]     Tingling, numbness, nausea  . Gabapentin Nausea And Vomiting  . Nortriptyline Other (See Comments)    Other reaction(s): Other (See Comments)  . Lamictal [Lamotrigine]     Self injurious thoughts  . Milnacipran Other (See Comments)    Pressure in eyes  . Tegaderm Ag Mesh [Silver] Itching  . Diclofenac Other (See Comments)    Other reaction(s): Other (See Comments)  . Duloxetine Hcl Other (See Comments)    Other reaction(s): Other (See Comments) Other reaction(s): unknown  . Montelukast Sodium Other (See Comments)    Other reaction(s): wheezing/sob  .  Mupirocin Other (See Comments)  . Naproxen Other (See Comments) and Rash    Other reaction(s): rash/itching  . Naproxen Sodium Rash    mild rash  . Other Rash  . Oxycodone Hcl Rash  . Sulfa Antibiotics Hives, Rash and Itching     Review of Systems Constitutional: No recent fever/chills/sweats Respiratory: No recent cough/bronchitis Cardiovascular: No chest pain Gastrointestinal: No recent nausea/vomiting/diarrhea Genitourinary: No UTI symptoms Hematologic/lymphatic:No history of coagulopathy or recent blood thinner use    Objective:   Blood pressure 121/84, pulse 70, height 5\' 3"  (1.6 m), weight 188 lb 12.8 oz (85.6 kg), last menstrual period 02/16/2021. CONSTITUTIONAL: Well-developed, well-nourished female in no acute distress.  HENT:  Normocephalic, atraumatic, External right and left ear normal. Oropharynx is clear and moist EYES: Conjunctivae and EOM are normal. Pupils are equal, round, and reactive to light. No scleral icterus.  NECK: Normal range of motion, supple, no masses SKIN: Skin is warm and dry. No rash noted. Not diaphoretic. No erythema. No pallor. NEUROLOGIC: Alert and oriented to person, place, and time. Normal reflexes, muscle tone coordination. No cranial nerve deficit noted. PSYCHIATRIC: Normal mood and affect. Normal behavior. Normal judgment and thought  content. CARDIOVASCULAR: Normal heart rate noted, regular rhythm RESPIRATORY: Effort and breath sounds normal, no problems with respiration noted ABDOMEN: Soft, nontender, nondistended. PELVIC: Deferred MUSCULOSKELETAL: Normal range of motion. No edema and no tenderness. 2+ distal pulses.    Labs: Results for orders placed or performed during the hospital encounter of 03/03/21 (from the past 336 hour(s))  SARS CORONAVIRUS 2 (TAT 6-24 HRS) Nasopharyngeal Nasopharyngeal Swab   Collection Time: 03/03/21  9:30 AM   Specimen: Nasopharyngeal Swab  Result Value Ref Range   SARS Coronavirus 2 NEGATIVE NEGATIVE  CBC   Collection Time: 03/03/21  9:30 AM  Result Value Ref Range   WBC 5.8 4.0 - 10.5 K/uL   RBC 5.10 3.87 - 5.11 MIL/uL   Hemoglobin 14.4 12.0 - 15.0 g/dL   HCT 05/03/21 93.8 - 18.2 %   MCV 84.7 80.0 - 100.0 fL   MCH 28.2 26.0 - 34.0 pg   MCHC 33.3 30.0 - 36.0 g/dL   RDW 99.3 71.6 - 96.7 %   Platelets 317 150 - 400 K/uL   nRBC 0.0 0.0 - 0.2 %  Basic metabolic panel   Collection Time: 03/03/21  9:30 AM  Result Value Ref Range   Sodium 136 135 - 145 mmol/L   Potassium 3.8 3.5 - 5.1 mmol/L   Chloride 105 98 - 111 mmol/L   CO2 24 22 - 32 mmol/L   Glucose, Bld 80 70 - 99 mg/dL   BUN 9 6 - 20 mg/dL   Creatinine, Ser 05/03/21 0.44 - 1.00 mg/dL   Calcium 8.6 (L) 8.9 - 10.3 mg/dL   GFR, Estimated 8.10 >17 mL/min   Anion gap 7 5 - 15     Imaging Studies:  ULTRASOUND REPORT Location: [facility] Date of Service:  11/20/17   Indications: F/U Left Ovarian Cyst Findings:  The uterus measures 7.6 x 4.4 x 4.0 cm. Echo texture is homogeneous without evidence of focal masses. The Endometrium measures 1.7 mm.  Right Ovary measures 2.2 x 1.8 x 1.6 cm and appears WNL.  Left Ovary measures 2.8 x 1.8 x 1.9 cm and appears WNL.  Survey of the adnexa demonstrates no adnexal masses. There is no free fluid in the cul de sac.  Impression: 1. Retroverted uterus appears of  normal size and contour. 2.  The endometrium measures 1.7 mm. 3. Bilateral ovaries appear WNL.  Recommendations: 1.Clinical correlation with the patient's History and Physical Exam.   Kari Baars, RDMS   Assessment:    1. DUB (dysfunctional uterine bleeding)   2. Dysmenorrhea, unspecified      Plan:   - Counseling: Procedure, risks, reasons, benefits and complications (including injury to bowel, bladder, major blood vessel, ureter, bleeding, possibility of transfusion, infection, or fistula formation) reviewed in detail. Likelihood of success in alleviating the patient's condition was discussed. Routine postoperative instructions will be reviewed with the patient and her family in detail after surgery.  The patient concurred with the proposed plan, giving informed written consent for the surgery.   - Preop testing reviewed. - Instructions reviewed, including NPO after midnight. - Continue Aygestin for now.     Hildred Laser, MD [facility]

## 2021-03-03 NOTE — Patient Instructions (Signed)
You are scheduled for surgery on 03/05/2021.  Nothing to eat after midnight on day prior to surgery.  Do not take any medications unless recommended by your provider on day prior to surgery.  Do not take NSAIDs (Motrin, Aleve) or aspirin 7 days prior to surgery.  You may take Tylenol products for minor aches and pains.  You will receive a prescription for pain medications post-operatively.  Please call the office if you have any questions regarding your upcoming surgery.

## 2021-03-03 NOTE — Progress Notes (Signed)
    GYNECOLOGY PROGRESS NOTE  Subjective:    Patient ID: Yesenia Wood, female    DOB: 12-25-1985, 35 y.o.   MRN: [id number]  HPI  Patient is a 35 y.o. G0P0000 female who presents for further discussion of surgery and preoperative examination for Hysteroscopy D&C with endometrial ablation for dysfunctional uterine bleeding and dysmenorrhea. She has tried several interventions to date, including OCPs (combined and continuous methods),  Mirena IUD, Orilissa, and Aygestin (which she is currently taking).  She underwent laparoscopic evaluation for endometriosis during tubal ligation surgery with negative biopsies.  Declines definitive therapy with hysterectomy at this time.  She does report that she began spotting yesterday.    The following portions of the patient's history were reviewed and updated as appropriate: allergies, current medications, past family history, past medical history, past social history, past surgical history and problem list.  Review of Systems Pertinent items noted in HPI and remainder of comprehensive ROS otherwise negative.   Objective:   Blood pressure 121/84, pulse 70, height 5\' 3"  (1.6 m), weight 188 lb 12.8 oz (85.6 kg), last menstrual period 02/16/2021. General appearance: alert and no distress Abdomen: soft, non-tender; bowel sounds normal; no masses,  no organomegaly Pelvic: deferred.  See H&P for remainder of exam.    Assessment:   1. DUB (dysfunctional uterine bleeding)   2. Dysmenorrhea, unspecified     Plan:   1. Patient with persistent dysfunctional uterine bleeding currently on Aygestin. She has tried multiple interventions to date, including several different OCPs, IUD, Depo-Provera, and a trial of 02/18/2021.  She also had a work-up for her abnormal bleeding and dysmenorrhea in the past, however that work-up was negative for endometriosis.  Again reiterated limitations as patient has tried multiple options, only other options available include  hormonal suppression with  danazol, Depo-Lupron (if insurance allows), or consideration of surgical intervention (definitive with hysterectomy, or could consider ablation as childbearing not desired and has BTL, however due to current age may have further issues with bleeding recurrence as longevity of procedure is averaged at 7-10 years. Also discussed risk of post ablation sterilization syndrome).  2. Patient scheduled for surgical management with Hysteroscopy D&C and endometrial ablation (Minerva) on 03/05/21.  The risks of surgery were discussed in detail with the patient including but not limited to: bleeding which may require transfusion or reoperation; infection which may require prolonged hospitalization or re-hospitalization and antibiotic therapy; injury to bowel, bladder, ureters and major vessels or other surrounding organs; formation of adhesions; need for additional procedures including laparotomy or subsequent procedures secondary to abnormal pathology; thromboembolic phenomenon; incisional problems and other postoperative or anesthesia complications.  Patient was told that the likelihood that her condition and symptoms will be treated effectively with this surgical management was high; the postoperative expectations were also discussed in detail. The patient also understands the alternative treatment options which were discussed in full. All questions were answered. She is aware of need for preoperative COVID testing and subsequent quarantine from time of test to time of surgery, performed today.     05/05/21, MD [facility]

## 2021-03-03 NOTE — H&P (Signed)
@[place] @   GYNECOLOGY PREOPERATIVE HISTORY AND PHYSICAL   Subjective:  Yesenia Wood is a 35 y.o. G0P0000 deaf female here for surgical management of abnormal uterine bleeding and dysmenorrhea.  She has tried several interventions to date, including OCPs (combined and continuous methods),  Mirena IUD, Orilissa, and Aygestin (which she is currently taking). She underwent laparoscopic evaluation for endometriosis during tubal ligation surgery with negative biopsies.  Declines definitive therapy with hysterectomy at this time. No significant preoperative concerns.  Proposed surgery: Hysteroscopy D&C with endometrial ablation (Minerva)   Pertinent Gynecological History: Menses: flow isirregular with intermenstrual spotting/bleeding pads Bleeding: dysfunctional uterine bleeding Contraception: tubal ligation Last pap: normal Date: 04/11/2017   Past Medical History:  Diagnosis Date  . ADHD (attention deficit hyperactivity disorder)   . B12 deficiency   . Bell's palsy    right sided  . Chest pain    a. 05/2015 ETT: elev HR/BP, but no acute ST/T changes; b. 09/2019 Cor CTA: Nl cors, Ca2+ = 0.  . Chronic migraine   . Deaf   . Depression   . 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 Fatigue   . Fatty liver   . Fibromyalgia   . GERD (gastroesophageal reflux disease)   . Headache   . High triglycerides   . Hyperglycemia   . Hypertension   . Hyperthyroidism   . Moderate asthma   . Obesity   . Paresthesia   . Premature birth     Past Surgical History:  Procedure Laterality Date  . BARTHOLIN GLAND CYST REMOVAL    . COCHLEAR IMPLANT    . COCHLEAR IMPLANT REMOVAL    . LAPAROSCOPIC TUBAL LIGATION Bilateral 03/26/2018   Procedure: LAPAROSCOPIC TUBAL LIGATION;  Surgeon: 03/28/2018, MD;  Location: [facility] ORS;  Service: Gynecology;  Laterality: Bilateral;  with peritoneal biopsies  . LAPAROSCOPY    . MYRINGOTOMY WITH TUBE PLACEMENT     . TUBAL LIGATION  03/26/2018   OB History  Gravida Para Term Preterm AB Living  0 0 0 0 0 0  SAB IAB Ectopic Multiple Live Births  0 0 0 0 0    Family History  Adopted: Yes  Family history unknown: Yes    Social History   Socioeconomic History  . Marital status: Single    Spouse name: Not on file  . Number of children: 0  . Years of education: Not on file  . Highest education level: Some college, no degree  Occupational History  . Occupation: disability for hearing loss  Tobacco Use  . Smoking status: Former Smoker    Packs/day: 1.00    Years: 2.00    Pack years: 2.00    Types: Cigarettes    Start date: 11/28/2004    Quit date: 11/28/2006    Years since quitting: 14.2  . Smokeless tobacco: Never Used  Vaping Use  . Vaping Use: Never used  Substance and Sexual Activity  . Alcohol use: Yes    Alcohol/week: 0.0 standard drinks    Comment: occasional   . Drug use: No  . Sexual activity: Not Currently    Birth control/protection: Pill    Comment: Seasonale  Other Topics Concern  . Not on file  Social History Narrative   Lives with her adopted  parents   Hearing loss since birth    Social Determinants of Health   Financial Resource Strain: Low Risk   . Difficulty of Paying Living Expenses: Not  very hard  Food Insecurity: No Food Insecurity  . Worried About Running Out of Food in the Last Year: Never true  . Ran Out of Food in the Last Year: Never true  Transportation Needs: Unmet Transportation Needs  . Lack of Transportation (Medical): Yes  . Lack of Transportation (Non-Medical): No  Physical Activity: Insufficiently Active  . Days of Exercise per Week: 4 days  . Minutes of Exercise per Session: 20 min  Stress: No Stress Concern Present  . Feeling of Stress : Only a little  Social Connections: Socially Isolated  . Frequency of Communication with Friends and Family: Once a week  . Frequency of Social Gatherings with Friends and Family: Never  . Attends  Religious Services: Never  . Active Member of Clubs or Organizations: No  . Attends Club or Organization Meetings: Never  . Marital Status: Never married  Intimate Partner Violence: Not At Risk  . Fear of Current or Ex-Partner: No  . Emotionally Abused: No  . Physically Abused: No  . Sexually Abused: No    Current Outpatient Medications on File Prior to Visit  Medication Sig Dispense Refill  . acebutolol (SECTRAL) 200 MG capsule TAKE 1 CAPSULE BY MOUTH TWICE A DAY (Patient taking differently: Take 200 mg by mouth 2 (two) times daily.) 180 capsule 3  . acetaminophen (TYLENOL) 500 MG tablet Take 500 mg by mouth every 6 (six) hours as needed for moderate pain.    . BOTOX 100 units SOLR injection every 3 (three) months.     . calcium carbonate (TUMS - DOSED IN MG ELEMENTAL CALCIUM) 500 MG chewable tablet Chew 1 tablet by mouth daily as needed for indigestion or heartburn.    . Cyanocobalamin (VITAMIN B-12) 500 MCG SUBL Place 1 tablet (500 mcg total) under the tongue once a week. 30 tablet 0  . diphenhydrAMINE (BENADRYL) 50 MG tablet Take 50 mg by mouth at bedtime as needed for itching.    . docusate sodium (COLACE) 100 MG capsule Take 100 mg by mouth daily as needed for mild constipation.    . ferrous sulfate 325 (65 FE) MG tablet Take 325 mg by mouth daily with breakfast.    . folic acid (FOLVITE) 800 MCG tablet Take 800 mcg by mouth daily. 800 mcg    . norethindrone (AYGESTIN) 5 MG tablet TAKE 2 TABLETS BY MOUTH EVERY DAY 60 tablet 0  . rosuvastatin (CRESTOR) 20 MG tablet Take 1 tablet (20 mg total) by mouth daily. 90 tablet 1  . simethicone (MYLICON) 125 MG chewable tablet Chew 125 mg by mouth every 6 (six) hours as needed for flatulence.     No current facility-administered medications on file prior to visit.   Allergies  Allergen Reactions  . Budesonide-Formoterol Fumarate Anaphylaxis and Other (See Comments)    Other reaction(s): Other (See Comments) chest pain Chest pain chest  pain  . Astelin [Azelastine]     Tingling, numbness, nausea  . Gabapentin Nausea And Vomiting  . Nortriptyline Other (See Comments)    Other reaction(s): Other (See Comments)  . Lamictal [Lamotrigine]     Self injurious thoughts  . Milnacipran Other (See Comments)    Pressure in eyes  . Tegaderm Ag Mesh [Silver] Itching  . Diclofenac Other (See Comments)    Other reaction(s): Other (See Comments)  . Duloxetine Hcl Other (See Comments)    Other reaction(s): Other (See Comments) Other reaction(s): unknown  . Montelukast Sodium Other (See Comments)    Other reaction(s): wheezing/sob  .   Mupirocin Other (See Comments)  . Naproxen Other (See Comments) and Rash    Other reaction(s): rash/itching  . Naproxen Sodium Rash    mild rash  . Other Rash  . Oxycodone Hcl Rash  . Sulfa Antibiotics Hives, Rash and Itching     Review of Systems Constitutional: No recent fever/chills/sweats Respiratory: No recent cough/bronchitis Cardiovascular: No chest pain Gastrointestinal: No recent nausea/vomiting/diarrhea Genitourinary: No UTI symptoms Hematologic/lymphatic:No history of coagulopathy or recent blood thinner use    Objective:   Blood pressure 121/84, pulse 70, height 5\' 3"  (1.6 m), weight 188 lb 12.8 oz (85.6 kg), last menstrual period 02/16/2021. CONSTITUTIONAL: Well-developed, well-nourished female in no acute distress.  HENT:  Normocephalic, atraumatic, External right and left ear normal. Oropharynx is clear and moist EYES: Conjunctivae and EOM are normal. Pupils are equal, round, and reactive to light. No scleral icterus.  NECK: Normal range of motion, supple, no masses SKIN: Skin is warm and dry. No rash noted. Not diaphoretic. No erythema. No pallor. NEUROLOGIC: Alert and oriented to person, place, and time. Normal reflexes, muscle tone coordination. No cranial nerve deficit noted. PSYCHIATRIC: Normal mood and affect. Normal behavior. Normal judgment and thought  content. CARDIOVASCULAR: Normal heart rate noted, regular rhythm RESPIRATORY: Effort and breath sounds normal, no problems with respiration noted ABDOMEN: Soft, nontender, nondistended. PELVIC: Deferred MUSCULOSKELETAL: Normal range of motion. No edema and no tenderness. 2+ distal pulses.    Labs: Results for orders placed or performed during the hospital encounter of 03/03/21 (from the past 336 hour(s))  SARS CORONAVIRUS 2 (TAT 6-24 HRS) Nasopharyngeal Nasopharyngeal Swab   Collection Time: 03/03/21  9:30 AM   Specimen: Nasopharyngeal Swab  Result Value Ref Range   SARS Coronavirus 2 NEGATIVE NEGATIVE  CBC   Collection Time: 03/03/21  9:30 AM  Result Value Ref Range   WBC 5.8 4.0 - 10.5 K/uL   RBC 5.10 3.87 - 5.11 MIL/uL   Hemoglobin 14.4 12.0 - 15.0 g/dL   HCT 05/03/21 93.8 - 18.2 %   MCV 84.7 80.0 - 100.0 fL   MCH 28.2 26.0 - 34.0 pg   MCHC 33.3 30.0 - 36.0 g/dL   RDW 99.3 71.6 - 96.7 %   Platelets 317 150 - 400 K/uL   nRBC 0.0 0.0 - 0.2 %  Basic metabolic panel   Collection Time: 03/03/21  9:30 AM  Result Value Ref Range   Sodium 136 135 - 145 mmol/L   Potassium 3.8 3.5 - 5.1 mmol/L   Chloride 105 98 - 111 mmol/L   CO2 24 22 - 32 mmol/L   Glucose, Bld 80 70 - 99 mg/dL   BUN 9 6 - 20 mg/dL   Creatinine, Ser 05/03/21 0.44 - 1.00 mg/dL   Calcium 8.6 (L) 8.9 - 10.3 mg/dL   GFR, Estimated 8.10 >17 mL/min   Anion gap 7 5 - 15     Imaging Studies:  ULTRASOUND REPORT Location: [facility] Date of Service:  11/20/17   Indications: F/U Left Ovarian Cyst Findings:  The uterus measures 7.6 x 4.4 x 4.0 cm. Echo texture is homogeneous without evidence of focal masses. The Endometrium measures 1.7 mm.  Right Ovary measures 2.2 x 1.8 x 1.6 cm and appears WNL.  Left Ovary measures 2.8 x 1.8 x 1.9 cm and appears WNL.  Survey of the adnexa demonstrates no adnexal masses. There is no free fluid in the cul de sac.  Impression: 1. Retroverted uterus appears of  normal size and contour. 2.  The endometrium measures 1.7 mm. 3. Bilateral ovaries appear WNL.  Recommendations: 1.Clinical correlation with the patient's History and Physical Exam.   Kari Baars, RDMS   Assessment:    1. DUB (dysfunctional uterine bleeding)   2. Dysmenorrhea, unspecified      Plan:   - Counseling: Procedure, risks, reasons, benefits and complications (including injury to bowel, bladder, major blood vessel, ureter, bleeding, possibility of transfusion, infection, or fistula formation) reviewed in detail. Likelihood of success in alleviating the patient's condition was discussed. Routine postoperative instructions will be reviewed with the patient and her family in detail after surgery.  The patient concurred with the proposed plan, giving informed written consent for the surgery.   - Preop testing reviewed. - Instructions reviewed, including NPO after midnight. - Continue Aygestin for now.     Hildred Laser, MD [facility]

## 2021-03-05 ENCOUNTER — Ambulatory Visit: Payer: Medicare Other | Admitting: Urgent Care

## 2021-03-05 ENCOUNTER — Other Ambulatory Visit: Payer: Self-pay

## 2021-03-05 ENCOUNTER — Encounter
Admission: RE | Disposition: A | Discharge status: Home / Self Care | Payer: Self-pay | Source: Ambulatory Visit | Attending: Obstetrics and Gynecology

## 2021-03-05 ENCOUNTER — Encounter: Payer: Self-pay | Admitting: Obstetrics and Gynecology

## 2021-03-05 ENCOUNTER — Ambulatory Visit
Admission: RE | Admit: 2021-03-05 | Discharge: 2021-03-05 | Disposition: A | Discharge status: Home / Self Care | Payer: Medicare Other | Source: Ambulatory Visit | Attending: Obstetrics and Gynecology | Admitting: Obstetrics and Gynecology

## 2021-03-05 DIAGNOSIS — N938 Other specified abnormal uterine and vaginal bleeding: Secondary | ICD-10-CM

## 2021-03-05 DIAGNOSIS — Z79899 Other long term (current) drug therapy: Secondary | ICD-10-CM | POA: Insufficient documentation

## 2021-03-05 DIAGNOSIS — Q51810 Arcuate uterus: Secondary | ICD-10-CM | POA: Insufficient documentation

## 2021-03-05 DIAGNOSIS — Z87891 Personal history of nicotine dependence: Secondary | ICD-10-CM | POA: Diagnosis not present

## 2021-03-05 DIAGNOSIS — Z793 Long term (current) use of hormonal contraceptives: Secondary | ICD-10-CM | POA: Diagnosis not present

## 2021-03-05 DIAGNOSIS — H9193 Unspecified hearing loss, bilateral: Secondary | ICD-10-CM

## 2021-03-05 DIAGNOSIS — N939 Abnormal uterine and vaginal bleeding, unspecified: Secondary | ICD-10-CM | POA: Diagnosis not present

## 2021-03-05 DIAGNOSIS — N946 Dysmenorrhea, unspecified: Secondary | ICD-10-CM | POA: Diagnosis not present

## 2021-03-05 HISTORY — PX: DILATATION AND CURETTAGE/HYSTEROSCOPY WITH MINERVA: SHX6851

## 2021-03-05 LAB — POCT PREGNANCY, URINE: Preg Test, Ur: NEGATIVE

## 2021-03-05 SURGERY — DILATATION AND CURETTAGE/HYSTEROSCOPY WITH MINERVA
Anesthesia: General | Site: Uterus

## 2021-03-05 MED ORDER — FENTANYL CITRATE (PF) 100 MCG/2ML IJ SOLN
INTRAMUSCULAR | Status: AC
  Administered 2021-03-05: 25 ug via INTRAVENOUS
  Filled 2021-03-05: qty 2

## 2021-03-05 MED ORDER — PHENYLEPHRINE HCL (PRESSORS) 10 MG/ML IV SOLN
INTRAVENOUS | Status: DC | PRN
  Administered 2021-03-05 (×2): 100 ug via INTRAVENOUS
  Administered 2021-03-05 (×2): 200 ug via INTRAVENOUS
  Administered 2021-03-05 (×2): 100 ug via INTRAVENOUS

## 2021-03-05 MED ORDER — DEXMEDETOMIDINE HCL 200 MCG/2ML IV SOLN
INTRAVENOUS | Status: DC | PRN
  Administered 2021-03-05: 8 ug via INTRAVENOUS
  Administered 2021-03-05: 12 ug via INTRAVENOUS

## 2021-03-05 MED ORDER — ACETAMINOPHEN 500 MG PO TABS: 1000.0000 mg | ORAL_TABLET | Freq: Four times a day (QID) | ORAL | 0 refills | Status: DC | PRN

## 2021-03-05 MED ORDER — PROPOFOL 10 MG/ML IV BOLUS
INTRAVENOUS | Status: AC
  Filled 2021-03-05: qty 20

## 2021-03-05 MED ORDER — DEXAMETHASONE SODIUM PHOSPHATE 10 MG/ML IJ SOLN
INTRAMUSCULAR | Status: AC
  Filled 2021-03-05: qty 1

## 2021-03-05 MED ORDER — ACETAMINOPHEN 10 MG/ML IV SOLN
INTRAVENOUS | Status: DC | PRN
  Administered 2021-03-05: 1000 mg via INTRAVENOUS

## 2021-03-05 MED ORDER — PROPOFOL 10 MG/ML IV BOLUS
INTRAVENOUS | Status: DC | PRN
  Administered 2021-03-05: 120 mg via INTRAVENOUS

## 2021-03-05 MED ORDER — IBUPROFEN 600 MG PO TABS: 600.0000 mg | ORAL_TABLET | Freq: Four times a day (QID) | ORAL | 0 refills | Status: DC | PRN

## 2021-03-05 MED ORDER — GLYCOPYRROLATE 0.2 MG/ML IJ SOLN
INTRAMUSCULAR | Status: AC
  Filled 2021-03-05: qty 1

## 2021-03-05 MED ORDER — DEXMEDETOMIDINE (PRECEDEX) IN NS 20 MCG/5ML (4 MCG/ML) IV SYRINGE
PREFILLED_SYRINGE | INTRAVENOUS | Status: AC
  Filled 2021-03-05: qty 5

## 2021-03-05 MED ORDER — ACETAMINOPHEN 10 MG/ML IV SOLN
INTRAVENOUS | Status: AC
  Filled 2021-03-05: qty 100

## 2021-03-05 MED ORDER — GLYCOPYRROLATE 0.2 MG/ML IJ SOLN
INTRAMUSCULAR | Status: DC | PRN
  Administered 2021-03-05: .2 mg via INTRAVENOUS

## 2021-03-05 MED ORDER — ONDANSETRON HCL 4 MG/2ML IJ SOLN
INTRAMUSCULAR | Status: AC
  Filled 2021-03-05: qty 2

## 2021-03-05 MED ORDER — MIDAZOLAM HCL 2 MG/2ML IJ SOLN
INTRAMUSCULAR | Status: DC | PRN
  Administered 2021-03-05: 2 mg via INTRAVENOUS

## 2021-03-05 MED ORDER — ACETAMINOPHEN 10 MG/ML IV SOLN: 1000.0000 mg | Freq: Once | INTRAVENOUS | Status: DC | PRN

## 2021-03-05 MED ORDER — ACETAMINOPHEN 500 MG PO TABS: 1000.0000 mg | ORAL_TABLET | Freq: Four times a day (QID) | ORAL | 2 refills | Status: AC | PRN

## 2021-03-05 MED ORDER — MIDAZOLAM HCL 2 MG/2ML IJ SOLN
INTRAMUSCULAR | Status: AC
  Filled 2021-03-05: qty 2

## 2021-03-05 MED ORDER — ORAL CARE MOUTH RINSE: 15.0000 mL | Freq: Once | OROMUCOSAL | Status: DC

## 2021-03-05 MED ORDER — FENTANYL CITRATE (PF) 100 MCG/2ML IJ SOLN
25.0000 ug | INTRAMUSCULAR | Status: DC | PRN
  Administered 2021-03-05: 50 ug via INTRAVENOUS
  Administered 2021-03-05: 25 ug via INTRAVENOUS

## 2021-03-05 MED ORDER — LIDOCAINE HCL (PF) 2 % IJ SOLN
INTRAMUSCULAR | Status: AC
  Filled 2021-03-05: qty 5

## 2021-03-05 MED ORDER — LACTATED RINGERS IV SOLN: INTRAVENOUS | Status: DC

## 2021-03-05 MED ORDER — FENTANYL CITRATE (PF) 100 MCG/2ML IJ SOLN
INTRAMUSCULAR | Status: AC
  Filled 2021-03-05: qty 2

## 2021-03-05 MED ORDER — FENTANYL CITRATE (PF) 100 MCG/2ML IJ SOLN
INTRAMUSCULAR | Status: DC | PRN
  Administered 2021-03-05: 50 ug via INTRAVENOUS

## 2021-03-05 MED ORDER — ONDANSETRON HCL 4 MG/2ML IJ SOLN
INTRAMUSCULAR | Status: DC | PRN
  Administered 2021-03-05: 4 mg via INTRAVENOUS

## 2021-03-05 MED ORDER — LIDOCAINE HCL (CARDIAC) PF 100 MG/5ML IV SOSY
PREFILLED_SYRINGE | INTRAVENOUS | Status: DC | PRN
  Administered 2021-03-05: 20 mg via INTRAVENOUS
  Administered 2021-03-05: 80 mg via INTRAVENOUS

## 2021-03-05 MED ORDER — CHLORHEXIDINE GLUCONATE 0.12 % MT SOLN: 15.0000 mL | Freq: Once | OROMUCOSAL | Status: DC

## 2021-03-05 MED ORDER — ONDANSETRON HCL 4 MG/2ML IJ SOLN: 4.0000 mg | Freq: Once | INTRAMUSCULAR | Status: DC | PRN

## 2021-03-05 MED ORDER — POVIDONE-IODINE 10 % EX SWAB: 2.0000 "application " | Freq: Once | CUTANEOUS | Status: DC

## 2021-03-05 MED ORDER — SILVER NITRATE-POT NITRATE 75-25 % EX MISC
CUTANEOUS | Status: AC
  Filled 2021-03-05: qty 10

## 2021-03-05 MED ORDER — DEXAMETHASONE SODIUM PHOSPHATE 10 MG/ML IJ SOLN
INTRAMUSCULAR | Status: DC | PRN
  Administered 2021-03-05: 6 mg via INTRAVENOUS

## 2021-03-05 MED ORDER — CHLORHEXIDINE GLUCONATE 0.12 % MT SOLN
OROMUCOSAL | Status: AC
  Filled 2021-03-05: qty 15

## 2021-03-05 SURGICAL SUPPLY — 20 items
ABLATOR SURESOUND NOVASURE (ABLATOR) IMPLANT
CANISTER SUCT 1200ML W/VALVE (MISCELLANEOUS) ×2 IMPLANT
CATH ROBINSON RED A/P 16FR (CATHETERS) ×2 IMPLANT
COVER WAND RF STERILE (DRAPES) IMPLANT
GLOVE SURG ENC MOIS LTX SZ6.5 (GLOVE) ×2 IMPLANT
GLOVE SURG UNDER LTX SZ7 (GLOVE) ×2 IMPLANT
GOWN STRL REUS W/ TWL LRG LVL3 (GOWN DISPOSABLE) ×2 IMPLANT
GOWN STRL REUS W/TWL LRG LVL3 (GOWN DISPOSABLE) ×4
HANDPIECE ABLA MINERVA ENDO (MISCELLANEOUS) ×2 IMPLANT
IV LACTATED RINGERS 1000ML (IV SOLUTION) ×2 IMPLANT
KIT TURNOVER CYSTO (KITS) ×2 IMPLANT
LABEL OR SOLS (LABEL) ×2 IMPLANT
MANIFOLD NEPTUNE II (INSTRUMENTS) ×2 IMPLANT
NS IRRIG 500ML POUR BTL (IV SOLUTION) ×2 IMPLANT
PACK DNC HYST (MISCELLANEOUS) ×2 IMPLANT
PAD OB MATERNITY 4.3X12.25 (PERSONAL CARE ITEMS) ×2 IMPLANT
PAD PREP 24X41 OB/GYN DISP (PERSONAL CARE ITEMS) ×2 IMPLANT
SEAL ROD LENS SCOPE MYOSURE (ABLATOR) ×2 IMPLANT
TOWEL OR 17X26 4PK STRL BLUE (TOWEL DISPOSABLE) ×2 IMPLANT
TUBING CONNECTING 10 (TUBING) IMPLANT

## 2021-03-05 NOTE — Op Note (Signed)
Procedure(s): DILATATION AND CURETTAGE/HYSTEROSCOPY WITH MINERVA Procedure Note  Yesenia Wood female 35 y.o. 03/05/2021  Indications: The patient is a 35 y.o. deaf G0P0000 female with dysfunctional uterine bleeding, dysmenorrhea. Has failed multiple medication therapies. Desires non-definitive surgical intervention. History of previous tubal ligation.  She has been previously counseled on the risk of post ablation-ligation syndrome.   Pre-operative Diagnosis: Dysfunctional uterine bleeding, dysmenorrhea  Post-operative Diagnosis: Same, with slightly arcuate uterus.   Surgeon: Hildred Laser, MD  Assistants:  None.  Anesthesia: General endotracheal anesthesia  Findings: Uterus sounded to 9 cm.  Cervical length 3 cm.  Weakly proliferative endometrium.  Tubal ostia were visualized bilaterally.  No intrauterine masses.  Majority of uterine cavity adequately charred post-ablation. There was a segment of the posterior uterine surface that was not charred. No perforations noted.  Findings of possible arcuate uterine cavity.    Procedure Details: The patient was seen in the Holding Room. The risks, benefits, complications, treatment options, and expected outcomes were discussed with the patient.  The patient concurred with the proposed plan, giving informed consent.  The site of surgery properly noted/marked. The patient was taken to the Operating Room, identified as Yesenia Wood and the procedure verified as Procedure(s) (LRB): DILATATION AND CURETTAGE/HYSTEROSCOPY WITH MINERVA (N/A).   The patient was then placed under general anesthesia without difficulty.  She was then prepped and draped in the normal sterile fashion, and placed in the dorsal lithotomy position.  A time out was performed.  An exam under anesthesia was performed with the findings noted above.  Straight catheterization was performed. A sterile speculum was inserted into vagina. A single-tooth tenaculum was used to grasp the  anterior lip of the cervix. Cervical dilation was performed. A 5 mm hysteroscope was introduced into the uterus under direct visualization. The cavity was allowed to fill, and then the entire cavity was explored with the findings described above. The hysteroscope was removed, and a sharp curette was then passed into the uterus and endometrial sampling was collected for pathology.   Next the Minerva instrument was primed per instructions. The instrument was then placed into the endometrial canal and activated. The Minerva instrument was then removed from the uterine cavity.  The hysteroscope was then re-introduced for final survey, with charring of the endometrium noted anteriorly and at the lower uterine segment posteriorly. There was an area of the posterior uterine cavity that did not appear to get adequate charring.  The hysteroscope was removed from the patient's uterine cavity. The tenaculum was removed and excellent hemostasis was noted. The speculum was removed from the vagina.   All instrument and sponge counts were correct at the end of the procedure x 2.  The patient tolerated the procedure well.  She was awakened and taken to the PACU in stable condition.    Estimated Blood Loss:  10 ml      Drains: straight catheterization prior to procedure with  100 ml of clear urine         Total IV Fluids:  600 ml  Specimens: Endometrial curettings         Implants: None         Complications:  None; patient tolerated the procedure well.         Disposition: PACU - hemodynamically stable.         Condition: stable   Hildred Laser, MD [facility]

## 2021-03-05 NOTE — Anesthesia Procedure Notes (Signed)
Procedure Name: LMA Insertion Date/Time: 03/05/2021 12:56 PM Performed by: Lynden Oxford, CRNA Pre-anesthesia Checklist: Patient identified, Emergency Drugs available, Suction available and Patient being monitored Patient Re-evaluated:Patient Re-evaluated prior to induction Oxygen Delivery Method: Circle system utilized Preoxygenation: Pre-oxygenation with 100% oxygen Induction Type: IV induction Ventilation: Mask ventilation without difficulty LMA: LMA flexible inserted LMA Size: 3.5 Tube type: Oral Airway Equipment and Method: Patient positioned with wedge pillow Placement Confirmation: positive ETCO2 and breath sounds checked- equal and bilateral Tube secured with: Tape Dental Injury: Teeth and Oropharynx as per pre-operative assessment

## 2021-03-05 NOTE — Anesthesia Preprocedure Evaluation (Signed)
Anesthesia Evaluation  Patient identified by MRN, date of birth, ID band Patient awake    Reviewed: Allergy & Precautions, NPO status , Patient's Chart, lab work & pertinent test results  History of Anesthesia Complications Negative for: history of anesthetic complications  Airway Mallampati: II  TM Distance: >3 FB Neck ROM: Full    Dental no notable dental hx. (+) Teeth Intact   Pulmonary asthma , sleep apnea , neg COPD, Patient abstained from smoking.Not current smoker, former smoker,    Pulmonary exam normal breath sounds clear to auscultation       Cardiovascular Exercise Tolerance: Good METShypertension, (-) CAD and (-) Past MI + dysrhythmias Supra Ventricular Tachycardia  Rhythm:Regular Rate:Normal - Systolic murmurs    Neuro/Psych  Headaches, PSYCHIATRIC DISORDERS Anxiety Depression Hx multiple personality disorder   GI/Hepatic GERD  Controlled,(+)     (-) substance abuse  ,   Endo/Other  neg diabetes  Renal/GU negative Renal ROS     Musculoskeletal  (+) Fibromyalgia -  Abdominal   Peds  Hematology   Anesthesia Other Findings Past Medical History: No date: ADHD (attention deficit hyperactivity disorder) No date: B12 deficiency No date: Bell's palsy     Comment:  right sided No date: Chest pain     Comment:  a. 05/2015 ETT: elev HR/BP, but no acute ST/T changes; b.              09/2019 Cor CTA: Nl cors, Ca2+ = 0. No date: Chronic migraine No date: Deaf No date: Depression No date: Dissocial personality disorder (HCC)     Comment:  three different personalities documented by PCP No date: Fatigue No date: Fatty liver No date: Fibromyalgia No date: GERD (gastroesophageal reflux disease) No date: Headache No date: High triglycerides No date: Hyperglycemia No date: Hypertension No date: Hyperthyroidism No date: Moderate asthma No date: Obesity No date: Paresthesia No date: Premature birth   Reproductive/Obstetrics                             Anesthesia Physical Anesthesia Plan  ASA: II  Anesthesia Plan: General   Post-op Pain Management:    Induction: Intravenous  PONV Risk Score and Plan: 4 or greater and Ondansetron, Dexamethasone and Midazolam  Airway Management Planned: LMA  Additional Equipment: None  Intra-op Plan:   Post-operative Plan: Extubation in OR  Informed Consent: I have reviewed the patients History and Physical, chart, labs and discussed the procedure including the risks, benefits and alternatives for the proposed anesthesia with the patient or authorized representative who has indicated his/her understanding and acceptance.     Dental advisory given and Interpreter used for interveiw (in person ASL interpreter used)  Plan Discussed with: CRNA and Surgeon  Anesthesia Plan Comments: (Discussed risks of anesthesia with patient, including PONV, sore throat, lip/dental damage. Rare risks discussed as well, such as cardiorespiratory and neurological sequelae. Patient understands.)        Anesthesia Quick Evaluation

## 2021-03-05 NOTE — Discharge Instructions (Signed)
Endometrial Ablation, Care After The following information offers guidance on how to care for yourself after your procedure. Your health care provider may also give you more specific instructions. If you have problems or questions, contact your health care provider. What can I expect after the procedure? After the procedure, it is common to have:  A need to urinate more often than usual for the first 24 hours.  Cramps that feel like menstrual cramps. These may last for 1-2 days.  A thin, watery vaginal discharge that is light pink or brown. This may last for a few weeks. Discharge will be heavy for the first few days after your procedure. You may need to wear a sanitary pad.  Nausea.  Vaginal bleeding for 4-6 weeks after the procedure, as tissue healing occurs. Follow these instructions at home: Medicines  Take over-the-counter and prescription medicines only as told by your health care provider.  If you were prescribed an antibiotic medicine, take it as told by your health care provider. Do not stop taking the antibiotic even if you start to feel better.  Ask your health care provider if the medicine prescribed to you: ? Requires you to avoid driving or using machinery. ? Can cause constipation. You may need to take these actions to prevent or treat constipation:  Drink enough fluid to keep your urine pale yellow.  Take over-the-counter or prescription medicines.  Eat foods that are high in fiber, such as beans, whole grains, and fresh fruits and vegetables.  Limit foods that are high in fat and processed sugars, such as fried or sweet foods.   Activity  If you were given a sedative during the procedure, it can affect you for several hours. Do not drive or operate machinery until your health care provider says that it is safe.  Do not have sex or put anything into your vagina until your health care provider says that it is safe.  Do not lift anything that is heavier than 5 lb  (2.3 kg), or the limit that you are told, until your health care provider says that it is safe.  Return to your normal activities as told by your health care provider. Ask your health care provider what activities are safe for you. General instructions  Do not take baths, swim, or use a hot tub until your health care provider says that it is safe. You will be able to take showers.  Check your vaginal area every day for signs of infection. Check for: ? Redness, swelling, or more pain. ? More blood coming from your vagina. ? A bad-smelling discharge.  Keep all follow-up visits. This is important. Contact a health care provider if:  You have vaginal redness, swelling, or more pain.  You have discharge or bleeding from your vagina that is getting worse.  You have a bad-smelling vaginal discharge.  You have a fever or chills.  You have trouble urinating. Get help right away if:  You have heavy, bright red vaginal bleeding that may include blood clots.  You have severe cramps that do not get better with medicine. Summary  After endometrial ablation, it is normal to have a thin, watery vaginal discharge that is light pink or brown. This may last a few weeks and may be heavier right after the procedure.  Vaginal bleeding is common after the procedure and should get better with time.  Check your vaginal area every day for signs of infection, such as a bad-smelling discharge.  Keep all follow-up  visits. This is important. This information is not intended to replace advice given to you by your health care provider. Make sure you discuss any questions you have with your health care provider. Document Revised: 06/04/2020 Document Reviewed: 06/04/2020 Elsevier Patient Education  2021 [employer]  AMBULATORY SURGERY  DISCHARGE INSTRUCTIONS   1) The drugs that you were given will stay in your system until tomorrow so for the next 24 hours you should not:  A) Drive an  automobile B) Make any legal decisions C) Drink any alcoholic beverage   2) You may resume regular meals tomorrow.  Today it is better to start with liquids and gradually work up to solid foods.  You may eat anything you prefer, but it is better to start with liquids, then soup and crackers, and gradually work up to solid foods.   3) Please notify your doctor immediately if you have any unusual bleeding, trouble breathing, redness and pain at the surgery site, drainage, fever, or pain not relieved by medication.    4) Additional Instructions:   Please contact your physician with any problems or Same Day Surgery at [phone], Monday through Friday 6 am to 4 pm, or [place] [facility] [place] [facility] number at [phone].  

## 2021-03-05 NOTE — Anesthesia Postprocedure Evaluation (Signed)
Anesthesia Post Note  Patient: YANELIE ABRAHA  Procedure(s) Performed: DILATATION AND CURETTAGE/HYSTEROSCOPY WITH MINERVA (N/A Uterus)  Patient location during evaluation: PACU Anesthesia Type: General Level of consciousness: awake and alert and oriented Pain management: pain level controlled Vital Signs Assessment: post-procedure vital signs reviewed and stable Respiratory status: spontaneous breathing, nonlabored ventilation and respiratory function stable Cardiovascular status: blood pressure returned to baseline and stable Postop Assessment: no signs of nausea or vomiting Anesthetic complications: no   No complications documented.   Last Vitals:  Vitals:   03/05/21 1357 03/05/21 1400  BP: 117/68 113/64  Pulse: 91 88  Resp: 15 17  Temp:    SpO2: 100% 100%    Last Pain:  Vitals:   03/05/21 1409  TempSrc:   PainSc: 6                  Loletha Bertini

## 2021-03-05 NOTE — Interval H&P Note (Signed)
History and Physical Interval Note:  03/05/2021 12:34 PM  Yesenia Wood  has presented today for surgery, with the diagnosis of Dysfunctional Uterine Bleeding.  The various methods of treatment have been discussed with the patient and family. After consideration of risks, benefits and other options for treatment, the patient has consented to  Procedure(s): DILATATION AND CURETTAGE/HYSTEROSCOPY WITH MINERVA (N/A) as a surgical intervention.  She previously has a surgical history of tubal ligation.The patient's history has been reviewed, patient examined, no change in status, stable for surgery.  I have reviewed the patient's chart and labs.  Questions were answered to the patient's satisfaction.     Hildred Laser, MD [facility]

## 2021-03-05 NOTE — Transfer of Care (Signed)
Immediate Anesthesia Transfer of Care Note  Patient: Yesenia Wood  Procedure(s) Performed: DILATATION AND CURETTAGE/HYSTEROSCOPY WITH MINERVA (N/A Uterus)  Patient Location: PACU  Anesthesia Type:General  Level of Consciousness: drowsy  Airway & Oxygen Therapy: Patient Spontanous Breathing and Patient connected to face mask oxygen  Post-op Assessment: Report given to RN and Post -op Vital signs reviewed and stable  Post vital signs: Reviewed and stable  Last Vitals:  Vitals Value Taken Time  BP 117/68 03/05/21 1357  Temp    Pulse 88 03/05/21 1401  Resp 11 03/05/21 1401  SpO2 100 % 03/05/21 1401  Vitals shown include unvalidated device data.  Last Pain:  Vitals:   03/05/21 1141  TempSrc: Temporal  PainSc: 3          Complications: No complications documented.

## 2021-03-06 ENCOUNTER — Encounter: Payer: Self-pay | Admitting: Obstetrics and Gynecology

## 2021-03-09 LAB — SURGICAL PATHOLOGY

## 2021-03-12 ENCOUNTER — Other Ambulatory Visit: Payer: Self-pay | Admitting: Obstetrics and Gynecology

## 2021-03-15 ENCOUNTER — Telehealth: Payer: Self-pay | Admitting: Obstetrics and Gynecology

## 2021-03-15 NOTE — Telephone Encounter (Signed)
Hello Dr. Valentino Saxon, I see that you prescribed this pt a lot of pain medication and she is asking for refills. Please advise. Thanks [employer]

## 2021-03-15 NOTE — Telephone Encounter (Signed)
Pt called about medication refills, pt recently had surgery and still experiencing pain: Tylenol- 500 mg (1,000mg  q6 hr) Ibuprofen  600mg  (q 6 hours alternating with tylenol). Pharmacy: [facility] [address] The one in [place]. Please Advise.

## 2021-03-16 NOTE — Telephone Encounter (Signed)
She can get a refill for Ibuprofen but change dose to 800 mg, every 8 hours prn.  #30, no refills.  I should be seeing her in the office sometime this week as well. I already refilled her Tylenol.

## 2021-03-17 ENCOUNTER — Other Ambulatory Visit (HOSPITAL_COMMUNITY)
Admission: RE | Admit: 2021-03-17 | Discharge: 2021-03-17 | Disposition: A | Discharge status: Home / Self Care | Payer: Medicare Other | Source: Ambulatory Visit | Attending: Obstetrics and Gynecology | Admitting: Obstetrics and Gynecology

## 2021-03-17 ENCOUNTER — Encounter: Payer: Self-pay | Admitting: Obstetrics and Gynecology

## 2021-03-17 ENCOUNTER — Ambulatory Visit (INDEPENDENT_AMBULATORY_CARE_PROVIDER_SITE_OTHER): Payer: Medicare Other | Admitting: Obstetrics and Gynecology

## 2021-03-17 ENCOUNTER — Other Ambulatory Visit: Payer: Self-pay

## 2021-03-17 VITALS — BP 112/87 | HR 77 | Ht 63.0 in | Wt 187.0 lb

## 2021-03-17 DIAGNOSIS — Z09 Encounter for follow-up examination after completed treatment for conditions other than malignant neoplasm: Secondary | ICD-10-CM

## 2021-03-17 DIAGNOSIS — Z9889 Other specified postprocedural states: Secondary | ICD-10-CM

## 2021-03-17 DIAGNOSIS — Z124 Encounter for screening for malignant neoplasm of cervix: Secondary | ICD-10-CM | POA: Diagnosis not present

## 2021-03-17 DIAGNOSIS — Z9851 Tubal ligation status: Secondary | ICD-10-CM

## 2021-03-17 NOTE — Progress Notes (Signed)
OBSTETRICS/GYNECOLOGY POST-OPERATIVE CLINIC VISIT  Subjective:     Yesenia Wood is a 35 y.o. female who presents to the clinic 2 weeks status post Hysteroscopy D&C with endometrial ablation (Minerva) for abnormal uterine bleeding.  She reports the following issues today:   1. Nausea with eating.  Has to drink Gatorade prior to consuming a meal.  2. Is feeling some dizziness.  Is worse when standing or if walking for more than 5 minutes  3. Notes her pain seems to get worse with walking. Was taking both Ibuprofen and Tylenol, however notes running out of Tylenol over the weekend.  Since then, her pain has been a little worse.  Feels like throbbing/cramping.   4. Is noting spotting/discharge for the past week, yellow and pink tinges. Reports seeing some tissue come out. Denies foul-smelling odor, fevers, or chills.   The following portions of the patient's history were reviewed and updated as appropriate: allergies, current medications, past family history, past medical history, past social history, past surgical history and problem list.  Review of Systems Pertinent items noted in HPI and remainder of comprehensive ROS otherwise negative.    Objective:    BP 112/87   Pulse 77   Ht 5\' 3"  (1.6 m)   Wt 187 lb (84.8 kg)   LMP 02/16/2021   BMI 33.13 kg/m  General:  alert and no distress  Abdomen: soft, bowel sounds active, non-tender  Pelvis:   external genitalia normal, rectovaginal septum normal.  Vagina with scant yellow-brown discharge, no odor.  Cervix normal appearing, no lesions and no motion tenderness.  Uterus mobile, nontender, normal shape and size.  Adnexae non-palpable, nontender bilaterally.     Pathology:  . Admission on 03/05/2021, Discharged on 03/05/2021  Component Date Value Ref Range Status  . SURGICAL PATHOLOGY 03/05/2021    Final-Edited                   Value:SURGICAL PATHOLOGY CASE: [id number] PATIENT: 03-06-2003 Surgical Pathology  Report     Specimen Submitted: A. Endometrial curettings  Clinical History: Dysfunctional uterine bleeding      DIAGNOSIS: A. UTERUS; CURETTINGS: - BENIGN PROLIFERATIVE ENDOMETRIUM, BENIGN ENDOCERVICAL MUCOSA AND BLOOD. - NEGATIVE FOR ATYPIA, HYPERPLASIA AND MALIGNANCY.   GROSS DESCRIPTION: A. Labeled: Endometrial curettings Received: Formalin Collection time: 1:33 PM on 03/05/2021 Placed into formalin time: 1:35 PM on 03/05/2021 Tissue fragment(s): Multiple Size: Aggregate, 4 x 3.2 x 1 cm Description: Received on a Telfa pad are fragments of red soft tissue and gelatinous tissue. Entirely submitted in cassettes 1-2.  RB 03/08/2021  Final Diagnosis performed by 05/08/2021, MD.   Electronically signed 03/09/2021 11:39:30AM The electronic signature indicates that the named Attending Pathologist has evaluated the specimen Technical component performed at [facility], [address] [place], [place] Lab: [phone] Dir: [phone], MD, MMM  Professional component performed at [facility], [facility], [address] [place], [place], [place] Lab: [phone] Dir: [phone]. Rubinas, MD     Assessment:   Postoperative visit S/p Hysteroscopy D&C with ablation Need for cervical cancer screening.  History of tubal ligation  Plan:   1. Continue any current medications. Patient has been given refill on Tylenol to continue alternating as it helps when she takes it. Discussed use of Dramamine or Pepto Bismol to help with nausea. Patient's mother  also notes having a prescription for Zofran at home. Dramamine can also help with dizziness.  2. Wound care discussed. 3. Operative findings again reviewed. Pathology report discussed.  Reviewed normal post-operative expectations during recovery.  4. Activity restrictions: no bending, stooping, or squatting and no lifting more than 10-15 pounds 5. Pap smear performed today as  patient is due.  6. Anticipated return to work: 1 week if applicable. 6. Follow up: as needed, or if symptoms worsen.     Hildred Laser, MD [facility]

## 2021-03-17 NOTE — Telephone Encounter (Signed)
Spoke to pt concerning her call to the office to see if she picked up her medication. Pt stated that the pharmacy did not contact her and will pick up her medication later today.

## 2021-03-17 NOTE — Progress Notes (Signed)
Pt present for post-op visit.  

## 2021-03-18 ENCOUNTER — Other Ambulatory Visit: Payer: Self-pay | Admitting: Obstetrics and Gynecology

## 2021-03-19 DIAGNOSIS — M791 Myalgia, unspecified site: Secondary | ICD-10-CM | POA: Diagnosis not present

## 2021-03-19 DIAGNOSIS — M542 Cervicalgia: Secondary | ICD-10-CM | POA: Diagnosis not present

## 2021-03-19 DIAGNOSIS — G518 Other disorders of facial nerve: Secondary | ICD-10-CM | POA: Diagnosis not present

## 2021-03-19 DIAGNOSIS — G43719 Chronic migraine without aura, intractable, without status migrainosus: Secondary | ICD-10-CM | POA: Diagnosis not present

## 2021-03-19 DIAGNOSIS — G43109 Migraine with aura, not intractable, without status migrainosus: Secondary | ICD-10-CM | POA: Diagnosis not present

## 2021-03-19 DIAGNOSIS — G43111 Migraine with aura, intractable, with status migrainosus: Secondary | ICD-10-CM | POA: Diagnosis not present

## 2021-03-23 LAB — CYTOLOGY - PAP
Comment: NEGATIVE
Diagnosis: NEGATIVE
Diagnosis: REACTIVE
High risk HPV: NEGATIVE

## 2021-03-25 ENCOUNTER — Ambulatory Visit (INDEPENDENT_AMBULATORY_CARE_PROVIDER_SITE_OTHER): Payer: Medicare Other | Admitting: Licensed Clinical Social Worker

## 2021-03-25 ENCOUNTER — Other Ambulatory Visit: Payer: Self-pay

## 2021-03-25 DIAGNOSIS — F4010 Social phobia, unspecified: Secondary | ICD-10-CM

## 2021-03-25 DIAGNOSIS — F331 Major depressive disorder, recurrent, moderate: Secondary | ICD-10-CM | POA: Diagnosis not present

## 2021-03-25 NOTE — Progress Notes (Signed)
Virtual Visit via Audio Note  I connected with Yesenia Wood on 03/25/21 at 11:00 AM EDT by an audio enabled telemedicine application and verified that I am speaking with the correct person using two identifiers.  Location: Patient: home Provider: [facility]   I discussed the limitations of evaluation and management by telemedicine and the availability of in person appointments. The patient expressed understanding and agreed to proceed.  I discussed the assessment and treatment plan with the patient. The patient was provided an opportunity to ask questions and all were answered. The patient agreed with the plan and demonstrated an understanding of the instructions.   The patient was advised to call back or seek an in-person evaluation if the symptoms worsen or if the condition fails to improve as anticipated.  I provided 45 minutes of non-face-to-face time during this encounter.   Yesenia Duncombe R Yoana Staib, LCSW    THERAPIST PROGRESS NOTE  Session Time: 11:15-12p  Participation Level: Active  Behavioral Response: NAAlertAnxious and Depressed  Type of Therapy: Individual Therapy  Treatment Goals addressed: Coping  Interventions: Supportive  Summary: Yesenia Wood is a 35 y.o. female who presents with symptoms consistent with depressive disorder and social anxiety.  Yesenia Wood reports that she is recovering from a recent surgical procedure and is coping with the daily pain fairly well. Pt is trying hard to rest "I have been only staying awake for around 2 hours at a time since the surgery".  Pt states that her father is continuing to recover from a shoulder replacement surgery, so her mother is taking care of both of them. "I don't want to be a burden to anyone, so I often just say I'm okay or go to sleep".  Encouraged pt to advocate for her own  needs if there is something that she needs help with.   Discussed activities that make pt feel better: spending time with the animals (riding around  on golf cart), crocheting, puzzles/games, watching TV. Encouraged stimulating activities in waking hours to help counter depression symptoms.  Pt is compliant with medications--states that she is trying to take as little pain meds as she can.  Continued recommendations are as follows: self care behaviors, positive social engagements, focusing on overall work/home/life balance, and focusing on positive physical and emotional wellness and allowing rest time during this period of recovery.   Suicidal/Homicidal: No  Therapist Response: Yesenia Wood is reporting that symptoms are not improving, but are not worse. Yesenia Wood reports that she really is not trying to increase physical activity or has made any significant changes in behaviors since last session due to recent surgical recovery. This is reflective of fluctuating/intermittent progress. Treatment to continue.  Plan: Return again in 3-4 weeks.  Diagnosis: Axis I: MDD, recurrent, moderate;     Axis II: No diagnosis    Ernest Haber [name], LCSW 03/25/2021

## 2021-03-29 ENCOUNTER — Other Ambulatory Visit: Payer: Self-pay | Admitting: Obstetrics and Gynecology

## 2021-04-22 ENCOUNTER — Ambulatory Visit (INDEPENDENT_AMBULATORY_CARE_PROVIDER_SITE_OTHER): Payer: Medicare Other | Admitting: Licensed Clinical Social Worker

## 2021-04-22 ENCOUNTER — Other Ambulatory Visit: Payer: Self-pay

## 2021-04-22 DIAGNOSIS — F4010 Social phobia, unspecified: Secondary | ICD-10-CM

## 2021-04-22 DIAGNOSIS — F331 Major depressive disorder, recurrent, moderate: Secondary | ICD-10-CM | POA: Diagnosis not present

## 2021-04-22 NOTE — Progress Notes (Signed)
Virtual Visit via Video Note  I connected with Yesenia Wood on 04/22/21 at  1:00 PM EDT by a video enabled telemedicine application and verified that I am speaking with the correct person using two identifiers.  Location: Patient: home Provider: remote office [place], [place])   I discussed the limitations of evaluation and management by telemedicine and the availability of in person appointments. The patient expressed understanding and agreed to proceed.  I discussed the assessment and treatment plan with the patient. The patient was provided an opportunity to ask questions and all were answered. The patient agreed with the plan and demonstrated an understanding of the instructions.   The patient was advised to call back or seek an in-person evaluation if the symptoms worsen or if the condition fails to improve as anticipated.  I provided 45 minutes of non-face-to-face time during this encounter.   Rochelle Nephew R Kennard Fildes, LCSW    THERAPIST PROGRESS NOTE  Session Time: 1-1:45p  Participation Level: Active  Behavioral Response: NAAlertAnxious  Type of Therapy: Individual Therapy  Treatment Goals addressed: Anxiety and Coping  Interventions: CBT and Supportive  Summary: Yesenia Wood is a 35 y.o. female who presents with symptoms associated with depression and social anxiety.  Pt is experiencing a headache at the time of session--pt elected to continue session.   Allowed pt safe space to explore thoughts and feelings related to current life experiences. Pt is currently recovering from surgery. Pt states that she feels her sleep/wake cycle has been disrupted. Pt states that her daily patterns are awake 3 hours, sleep 3 hours around the clock. Pt admits that eating patterns have also been erratic since her surgery. Encouraged pt to discuss with PCP next time she has appt to rule out any medically related fatigue.  Pt feels that overall mood has been stable--no mood fluctuations  recently.  Pt is excited about new hobby: punch embroidery. Pt will take pictures of completed work and email.    Pt reporting good relationships with parents.  Limited social engagement due to pts focus on physical recovery post surgery.  Continued recommendations are as follows: self care behaviors, positive social engagements, focusing on overall work/home/life balance, and focusing on positive physical and emotional wellness.   Suicidal/Homicidal: No  Therapist Response: Yesenia Wood is developing healthy cognitive patterns about self.  Yesenia Wood is being intentional about engaging in activities to help elevate mood. Pt is able to verbalize how fearful thinking impacts everyday thoughts, feelings, and behaviors and pt is able to push through her comfort zone. These behaviors are evidence that pt is progressing towards overall goals.   Treatment to continue as indicated  Plan: Return again in 4 weeks.  Diagnosis: Axis I: MDD, recurrent, moderate; social anxiety disorder    Axis II: No diagnosis    Ernest Haber Burnard Enis, LCSW 04/22/2021

## 2021-05-05 DIAGNOSIS — G43111 Migraine with aura, intractable, with status migrainosus: Secondary | ICD-10-CM | POA: Diagnosis not present

## 2021-05-05 DIAGNOSIS — G518 Other disorders of facial nerve: Secondary | ICD-10-CM | POA: Diagnosis not present

## 2021-05-05 DIAGNOSIS — M542 Cervicalgia: Secondary | ICD-10-CM | POA: Diagnosis not present

## 2021-05-05 DIAGNOSIS — G43719 Chronic migraine without aura, intractable, without status migrainosus: Secondary | ICD-10-CM | POA: Diagnosis not present

## 2021-05-05 DIAGNOSIS — M791 Myalgia, unspecified site: Secondary | ICD-10-CM | POA: Diagnosis not present

## 2021-05-05 DIAGNOSIS — G43109 Migraine with aura, not intractable, without status migrainosus: Secondary | ICD-10-CM | POA: Diagnosis not present

## 2021-05-21 ENCOUNTER — Other Ambulatory Visit: Payer: Self-pay | Admitting: Family Medicine

## 2021-05-21 DIAGNOSIS — Z23 Encounter for immunization: Secondary | ICD-10-CM | POA: Diagnosis not present

## 2021-05-21 DIAGNOSIS — E785 Hyperlipidemia, unspecified: Secondary | ICD-10-CM

## 2021-05-27 ENCOUNTER — Ambulatory Visit (INDEPENDENT_AMBULATORY_CARE_PROVIDER_SITE_OTHER): Payer: Medicare Other | Admitting: Licensed Clinical Social Worker

## 2021-05-27 ENCOUNTER — Other Ambulatory Visit: Payer: Self-pay

## 2021-05-27 DIAGNOSIS — F4010 Social phobia, unspecified: Secondary | ICD-10-CM

## 2021-05-27 DIAGNOSIS — F331 Major depressive disorder, recurrent, moderate: Secondary | ICD-10-CM

## 2021-05-27 NOTE — Progress Notes (Signed)
Virtual Visit via Audio Note  I connected with [name] on 05/27/21 at  1:00 PM EDT by an audio enabled telemedicine application and verified that I am speaking with the correct person using two identifiers.Sign language interpreter was on audio line for assistance.   Location: Patient: home Provider: remote office [place], [place])   I discussed the limitations of evaluation and management by telemedicine and the availability of in person appointments. The patient expressed understanding and agreed to proceed.   I discussed the assessment and treatment plan with the patient. The patient was provided an opportunity to ask questions and all were answered. The patient agreed with the plan and demonstrated an understanding of the instructions.   The patient was advised to call back or seek an in-person evaluation if the symptoms worsen or if the condition fails to improve as anticipated.  I provided 45 minutes of non-face-to-face time during this encounter.   Shayona Hibbitts R Aashir Umholtz, LCSW   THERAPIST PROGRESS NOTE  Session Time: 1-1:45p  Participation Level: Active  Behavioral Response: NAAlertAnxious  Type of Therapy: Individual Therapy  Treatment Goals addressed: Anxiety and Coping  Interventions: CBT and Solution Focused  Summary: ONEIDA MCKAMEY is a 35 y.o. female who presents with stable symptoms related to depression and social anxiety diagnoses. Patient reports that she's not really feeling depressed, and mood has been stable recently.  Patient feels that she is managing stressful situations well.   Discussed patients continuing recovery from surgery, and patient still feels like she is tired and lethargic all the time. Patient reports continued erratic sleeping patterns--continuing to sleep in chunks, will be awake for a few hours, then sleep a few hours, then wake up a few more hours throughout the course of the day. Patient reports said her eating times are also erratic  because of the sleeping patterns. Patient tries to get at least three meals in per day. Patient reports that she feels she's lost  around 15 pounds since the surgery. Patient attributes the weight loss to being off birth control pills, and states that her eating quantity is around the same.   Patient is excited about sister and niece that are coming to visit over the weekend. Patient states it has been a while since she has seen them.   Patient reports that she's feeling frustrated over the inability to engage physically the way that she would like to engage. Patient does report that she tries to go out early in the morning and get some walks in.   Discussed patients continuing chronic pain management. Patient is trying to find that balance between activity and relaxation that will be most beneficial for her. Patient recognizes that an activity is not helpful, and overactivity is also not helpful.   Discussed overall physical tension build up in muscles, and patient is hoping that she will have enough money to try a massage. Patient also would like to get a swimming pool that she can use for doing some exercising in.   Discussed cooking and baking--patient states that she recently made a Raspberry honey cake from raspberries that she foraged in her yard. Discussed food sources, food expense, and self sustaining by planting. Patient reports that she had a very large garden last year including tomatoes, onions, carrots, and lettuce. Patient states that she has vegetables planted for this year and truly enjoys working in the garden. Discussed all of the positives of this responsibility..   Continued recommendations are as follows: self care behaviors,  positive social engagements, focusing on overall work/home/life balance, and focusing on positive physical and emotional wellness.   Suicidal/Homicidal: No  Therapist Response: Patient is continuing to work hard to reduce overall level, frequency, and  intensity of the anxiety so that daily functioning is not impaired. Patient is able to identify coping mechanisms that have been successful in the past and increase their use. Patient is able to identify current and past experiences with specific fears, prominent worries, and anxiety symptoms including their impact on functioning and recent attempts to resolve it. Patient understands and can verbalize and understanding of the role that fearful thinking plays in creating fears, excessive worry, and persistent anxiety symptoms. Patient is continuing to implement a regular exercise regimen as a depression reduction technique. These behaviors are reflective of personal growth and progress. treatment to continue as indicated.  Plan: Return again in 4 weeks.  Diagnosis: Axis I: MDD, recurrent, moderate; social anxiety disorder    Axis II: No diagnosis    Ernest Haber Adeana Grilliot, LCSW 05/27/2021

## 2021-06-15 DIAGNOSIS — M791 Myalgia, unspecified site: Secondary | ICD-10-CM | POA: Diagnosis not present

## 2021-06-15 DIAGNOSIS — G43109 Migraine with aura, not intractable, without status migrainosus: Secondary | ICD-10-CM | POA: Diagnosis not present

## 2021-06-15 DIAGNOSIS — G518 Other disorders of facial nerve: Secondary | ICD-10-CM | POA: Diagnosis not present

## 2021-06-15 DIAGNOSIS — G43719 Chronic migraine without aura, intractable, without status migrainosus: Secondary | ICD-10-CM | POA: Diagnosis not present

## 2021-06-15 DIAGNOSIS — G43111 Migraine with aura, intractable, with status migrainosus: Secondary | ICD-10-CM | POA: Diagnosis not present

## 2021-06-15 DIAGNOSIS — M542 Cervicalgia: Secondary | ICD-10-CM | POA: Diagnosis not present

## 2021-07-09 ENCOUNTER — Ambulatory Visit (INDEPENDENT_AMBULATORY_CARE_PROVIDER_SITE_OTHER): Payer: Medicare Other | Admitting: Licensed Clinical Social Worker

## 2021-07-09 ENCOUNTER — Other Ambulatory Visit: Payer: Self-pay

## 2021-07-09 DIAGNOSIS — F331 Major depressive disorder, recurrent, moderate: Secondary | ICD-10-CM | POA: Diagnosis not present

## 2021-07-09 DIAGNOSIS — F4010 Social phobia, unspecified: Secondary | ICD-10-CM

## 2021-07-09 NOTE — Progress Notes (Signed)
Virtual Visit via Video Note  I connected with Yesenia Wood on 07/09/21 at 11:00 AM EDT by a video enabled telemedicine application and verified that I am speaking with the correct person using two identifiers.  Location: Patient: home Provider: remote office [place], [place])   I discussed the limitations of evaluation and management by telemedicine and the availability of in person appointments. The patient expressed understanding and agreed to proceed.  I discussed the assessment and treatment plan with the patient. The patient was provided an opportunity to ask questions and all were answered. The patient agreed with the plan and demonstrated an understanding of the instructions.   The patient was advised to call back or seek an in-person evaluation if the symptoms worsen or if the condition fails to improve as anticipated.  I provided 45 minutes of non-face-to-face time during this encounter.   Yesenia Lisbon R Twain Stenseth, LCSW   THERAPIST PROGRESS NOTE  Session Time: 11-11:45p  Participation Level: Active  Behavioral Response: NAAlertAnxious  Type of Therapy: Individual Therapy  Treatment Goals addressed: Anxiety  Interventions: CBT  Summary: Yesenia Wood is a 35 y.o. female who presents with continuing symptoms related to depression diagnosis. Patient reports that she is having significant fatigue every day. Patient also reports that she is continuing to have headaches on a daily basis. Patient feels that the fatigue and headaches are impacting overall quality of life. Encouraged patient to reach out to primary care physician to rule out any underlying medical causes of symptoms that she is currently experiencing. Patient agrees and wants to contact PCP. Explore relationships with parents and extended family members. Patient reports that relationship with parents is good, but relationships with extended family members continue to be strained due to community communication  difficulties. Discussed patient driving--patient reports that she has not operated a motor vehicle since before her surgery in April of 2022. Discussed breaking it down into small sessions of driving to get her used to it and to expand her comfort zone. Patient states that she really doesn't want to practice driving right now because everyone in the family is sharing a car and she feels that if she practices then it would be tying up a vehicle that is needed. Discussed online support groups--patient expresses interest during sessions at times, but does not follow through with any long term engagement. Continued recommendations are as follows: self care behaviors, positive social engagements, focusing on overall work/home/life balance, and focusing on positive physical and emotional wellness.  .   Suicidal/Homicidal: No  Therapist Response: Patient is continuing to work hard to reduce overall level, frequency, and intensity of the anxiety so that daily functioning is not impaired. Patient is able to identify coping mechanisms that have been successful in the past and increase their use. Patient is able to identify current and past experiences with specific fears, prominent worries, and anxiety symptoms including their impact on functioning and recent attempts to resolve it. Patient understands and can verbalize and understanding of the role that fearful thinking plays in creating fears, excessive worry, and persistent anxiety symptoms. Patient is continuing to implement a regular exercise regimen as a depression reduction technique. These behaviors are reflective of personal growth and progress. treatment to continue as indicated.  Plan: Return again in 8 weeks.  Diagnosis: Axis I: MDD, recurrent; social anxiety disorder    Axis II: No diagnosis    Yesenia Haber [name], LCSW 07/09/2021

## 2021-07-12 ENCOUNTER — Telehealth: Payer: Self-pay | Admitting: Cardiovascular Disease

## 2021-07-12 NOTE — Telephone Encounter (Signed)
     Great ! If anything gets worse or changes please let us know .  We have requested in person interpreter for your visit .   Take care and see you soon   ===View-only below this line===   ----- Message -----      From:Marne J Mcnatt      Sent:07/12/2021  2:20 PM EDT        YW:[id number] Kirke Corin, MD   Subject:Appointment Request  The date for appt sound good to me. Please make sure there is interpreter. Thank you!  Answer to questions; Weight: lost little weight since surgery in april Breath: shortness breath sometimes but that is not new Travel: haven't travel anywhere beside to doctor appointments or for essential shoppings.   ----- Message -----      From:Jennifer H      Sent:07/12/2021  2:06 PM EDT        RS:WNIOEV Kathlene Cote   Subject:Appointment Request  Mariana Arn Sanyla,   The next available appointment in the clinic right now is September 19th at 9:15 with Nicolasa Ducking.   I will go ahead and schedule that for you but also let Dr. Jari Sportsman nurse know what is going on with your leg.    Have you noticed any weight gain ( either 3 pounds overnight or 5 pounds in a week) ?  Any shortness of breath ?  Have you traveled recently ?   [facility]     ----- Message -----      From:Renda Kathlene Cote      Sent:07/11/2021 10:28 PM EDT        OJ:[id number] Kirke Corin, MD   Subject:Appointment Request  Appointment Request From: [name]  With Provider: Lorine Bears, MD [facility] [place]]  Preferred Date Range: Any  Preferred Times: Monday Morning, Tuesday Morning, Wednesday Morning, Thursday Morning, Friday Morning  Reason for visit: Office Visit  Comments: Right Calf swelling almost everyday, cause discomfort & bothers my sleep schudele, discuss possibles to reduce dose or change med if it will help?

## 2021-07-12 NOTE — Telephone Encounter (Signed)
Sent My Chart message to patient with some recommendations and instructions. Also sent appointment information as well.

## 2021-07-12 NOTE — Telephone Encounter (Signed)
Pt c/o swelling: STAT is pt has developed SOB within 24 hours  If swelling, where is the swelling located? Right calf  How much weight have you gained and in what time span? Lost weight since April   Have you gained 3 pounds in a day or 5 pounds in a week? no  Do you have a log of your daily weights (if so, list)? no  Are you currently taking a fluid pill? Not noted   Are you currently SOB? Sometimes but that is not new   Have you traveled recently? No traveling besides doctor appt and essential shopping    See mychart conversation .  Patient speaks ASL.  Scheduled 9/19 with berge .

## 2021-07-14 ENCOUNTER — Ambulatory Visit: Payer: Self-pay | Admitting: *Deleted

## 2021-07-14 NOTE — Telephone Encounter (Signed)
Reason for Disposition  [1] MODERATE leg swelling (e.g., swelling extends up to knees) AND [2] new-onset or worsening  Answer Assessment - Initial Assessment Questions 1. ONSET: "When did the swelling start?" (e.g., minutes, hours, days)     My calf is swollen and tight.   Very tight feeling.  It's coming in waves.  It's hard to sleep due to the tightness.   Not painful.  Temperature is same as other leg. 2. LOCATION: "What part of the leg is swollen?"  "Are both legs swollen or just one leg?"     A few years ago I had calf swelling.   This time is swollen more constant and swollen.   No new medications recently have been started.    It's the right calf.   Never an explanation for the swelling given in the past.   It wasn't a DVT.   No treatment given. 3. SEVERITY: "How bad is the swelling?" (e.g., localized; mild, moderate, severe)  - Localized - small area of swelling localized to one leg  - MILD pedal edema - swelling limited to foot and ankle, pitting edema < 1/4 inch (6 mm) deep, rest and elevation eliminate most or all swelling  - MODERATE edema - swelling of lower leg to knee, pitting edema > 1/4 inch (6 mm) deep, rest and elevation only partially reduce swelling  - SEVERE edema - swelling extends above knee, facial or hand swelling present      No real pain.   Just an annoyance.   Most time from the knee to the calf not the ankle is swollen.   4. REDNESS: "Does the swelling look red or infected?"     No redness or warmth. Right foot feels fine.   No swelling in foot.  No tingling or numbness in foot or toes.   Sometimes in the calf 5. PAIN: "Is the swelling painful to touch?" If Yes, ask: "How painful is it?"   (Scale 1-10; mild, moderate or severe)     No pain 6. FEVER: "Do you have a fever?" If Yes, ask: "What is it, how was it measured, and when did it start?"      No    7. CAUSE: "What do you think is causing the leg swelling?"     Honestly I don't know 8. MEDICAL HISTORY: "Do you  have a history of heart failure, kidney disease, liver failure, or cancer?"     I had a fatty liver but last time that was checked it was fine.   I have hypertension.   9. RECURRENT SYMPTOM: "Have you had leg swelling before?" If Yes, ask: "When was the last time?" "What happened that time?"     Yes 2-3 yrs ago.   Within the last year it's gotten worse.   It flares up more often than it used to.   Nothing is helping.   It's happening for varying times and lengths of time.    I had an ultrasound done in the past to r/o blood clots.  Twice.   10. OTHER SYMPTOMS: "Do you have any other symptoms?" (e.g., chest pain, difficulty breathing)       No shortness of breath or chest tightness.   Has not gained any weight.   No recent travel.  11. PREGNANCY: "Is there any chance you are pregnant?" "When was your last menstrual period?"       No not possible  Protocols used: Leg Swelling and Edema-A-AH

## 2021-07-14 NOTE — Telephone Encounter (Signed)
I returned pt's call.  I called her number and the interpreting service came on and interpreted the conversation in American Sign Language.    She had called in (uses sign language interpreter) c/o her right calf being swollen and tight and starting to become numb more often and more frequently.   She has had this problem before and had ultrasounds done twice that did not show DVTs.    She is concerned about the calf swelling happening more frequently and feeling tight with numbness for longer periods of time.  She's having difficulty sleeping because of the discomfort.    See triage notes  She has an appt for 07/26/2021.   I offered to send a note to [facility] to see if they could work her in sooner.  She preferred to keep her appt on 07/26/2021 because a short notice appt doesn't work for her as far as getting a sign language interpreter.   I went over the s/s to watch for and go to the ED if they occurred:  redness in her calf, pain, warmth, numbness/tingling does not go away, her right foot begins to feel numb/tingling or discolored or cool, develops shortness of breath worse than her usual shortness of breath and/or chest discomfort of any kind.   She was agreeable  to this plan and will go to the ED if she has any of these symptoms occur between now and her appt.  I sent my notes to [facility] for Dr. Carlynn Purl.

## 2021-07-17 ENCOUNTER — Emergency Department: Payer: Medicare Other

## 2021-07-17 ENCOUNTER — Other Ambulatory Visit: Payer: Self-pay

## 2021-07-17 ENCOUNTER — Emergency Department
Admission: EM | Admit: 2021-07-17 | Discharge: 2021-07-17 | Disposition: A | Discharge status: Home / Self Care | Payer: Medicare Other | Attending: Emergency Medicine | Admitting: Emergency Medicine

## 2021-07-17 DIAGNOSIS — R079 Chest pain, unspecified: Secondary | ICD-10-CM | POA: Diagnosis not present

## 2021-07-17 DIAGNOSIS — I10 Essential (primary) hypertension: Secondary | ICD-10-CM | POA: Insufficient documentation

## 2021-07-17 DIAGNOSIS — R0602 Shortness of breath: Secondary | ICD-10-CM | POA: Diagnosis not present

## 2021-07-17 DIAGNOSIS — M79661 Pain in right lower leg: Secondary | ICD-10-CM

## 2021-07-17 DIAGNOSIS — Z79899 Other long term (current) drug therapy: Secondary | ICD-10-CM | POA: Diagnosis not present

## 2021-07-17 DIAGNOSIS — Z87891 Personal history of nicotine dependence: Secondary | ICD-10-CM | POA: Insufficient documentation

## 2021-07-17 DIAGNOSIS — E039 Hypothyroidism, unspecified: Secondary | ICD-10-CM | POA: Diagnosis not present

## 2021-07-17 DIAGNOSIS — J4540 Moderate persistent asthma, uncomplicated: Secondary | ICD-10-CM | POA: Insufficient documentation

## 2021-07-17 DIAGNOSIS — M79604 Pain in right leg: Secondary | ICD-10-CM | POA: Insufficient documentation

## 2021-07-17 DIAGNOSIS — M791 Myalgia, unspecified site: Secondary | ICD-10-CM | POA: Diagnosis not present

## 2021-07-17 DIAGNOSIS — R0789 Other chest pain: Secondary | ICD-10-CM | POA: Insufficient documentation

## 2021-07-17 DIAGNOSIS — Z20822 Contact with and (suspected) exposure to covid-19: Secondary | ICD-10-CM | POA: Insufficient documentation

## 2021-07-17 DIAGNOSIS — M7989 Other specified soft tissue disorders: Secondary | ICD-10-CM | POA: Diagnosis not present

## 2021-07-17 DIAGNOSIS — R42 Dizziness and giddiness: Secondary | ICD-10-CM | POA: Diagnosis not present

## 2021-07-17 LAB — CBC WITH DIFFERENTIAL/PLATELET
Abs Immature Granulocytes: 0.02 10*3/uL (ref 0.00–0.07)
Basophils Absolute: 0.1 10*3/uL (ref 0.0–0.1)
Basophils Relative: 1 %
Eosinophils Absolute: 0 10*3/uL (ref 0.0–0.5)
Eosinophils Relative: 1 %
HCT: 42 % (ref 36.0–46.0)
Hemoglobin: 14.1 g/dL (ref 12.0–15.0)
Immature Granulocytes: 0 %
Lymphocytes Relative: 36 %
Lymphs Abs: 2.2 10*3/uL (ref 0.7–4.0)
MCH: 29.4 pg (ref 26.0–34.0)
MCHC: 33.6 g/dL (ref 30.0–36.0)
MCV: 87.5 fL (ref 80.0–100.0)
Monocytes Absolute: 0.6 10*3/uL (ref 0.1–1.0)
Monocytes Relative: 10 %
Neutro Abs: 3.1 10*3/uL (ref 1.7–7.7)
Neutrophils Relative %: 52 %
Platelets: 318 10*3/uL (ref 150–400)
RBC: 4.8 MIL/uL (ref 3.87–5.11)
RDW: 12.6 % (ref 11.5–15.5)
WBC: 6 10*3/uL (ref 4.0–10.5)
nRBC: 0 % (ref 0.0–0.2)

## 2021-07-17 LAB — TROPONIN I (HIGH SENSITIVITY): Troponin I (High Sensitivity): <2 ng/L (ref <18)

## 2021-07-17 LAB — COMPREHENSIVE METABOLIC PANEL
ALT: 61 U/L — ABNORMAL HIGH (ref 0–44)
AST: 40 U/L (ref 15–41)
Albumin: 4.3 g/dL (ref 3.5–5.0)
Alkaline Phosphatase: 64 U/L (ref 38–126)
Anion gap: 10 (ref 5–15)
BUN: 8 mg/dL (ref 6–20)
CO2: 24 mmol/L (ref 22–32)
Calcium: 8.9 mg/dL (ref 8.9–10.3)
Chloride: 103 mmol/L (ref 98–111)
Creatinine, Ser: 0.73 mg/dL (ref 0.44–1.00)
GFR, Estimated: >60 mL/min (ref >60)
Glucose, Bld: 96 mg/dL (ref 70–99)
Potassium: 4.2 mmol/L (ref 3.5–5.1)
Sodium: 137 mmol/L (ref 135–145)
Total Bilirubin: 0.8 mg/dL (ref 0.3–1.2)
Total Protein: 7.2 g/dL (ref 6.5–8.1)

## 2021-07-17 LAB — RESP PANEL BY RT-PCR (FLU A&B, COVID) ARPGX2
Influenza A by PCR: NEGATIVE
Influenza B by PCR: NEGATIVE
SARS Coronavirus 2 by RT PCR: NEGATIVE

## 2021-07-17 LAB — D-DIMER, QUANTITATIVE: D-Dimer, Quant: <0.27 ug/mL-FEU (ref 0.00–0.50)

## 2021-07-17 MED ORDER — IBUPROFEN 400 MG PO TABS
400.0000 mg | ORAL_TABLET | Freq: Once | ORAL | Status: AC
  Administered 2021-07-17: 400 mg via ORAL
  Filled 2021-07-17: qty 1

## 2021-07-17 NOTE — ED Triage Notes (Signed)
Pt comes pov with chest pain starting last night. Went away then came back worse today. Also endorses some dizziness. Pt is deaf and mom is signing for her.

## 2021-07-17 NOTE — ED Provider Notes (Signed)
HPI: Pt is a 35 y.o. female who presents with complaints of cp/sob  The patient p/w  pleuritic chest pain with left leg swelling. Concern for PE per family   ROS: Denies fever, chest pain, vomiting  Past Medical History:  Diagnosis Date   ADHD (attention deficit hyperactivity disorder)    B12 deficiency    Bell's palsy    right sided   Chest pain    a. 05/2015 ETT: elev HR/BP, but no acute ST/T changes; b. 09/2019 Cor CTA: Nl cors, Ca2+ = 0.   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  Focused Physical Exam: Gen: No acute distress Head: atraumatic, normocephalic Eyes: Extraocular movements grossly intact; conjunctiva clear CV: RRR Lung: No increased WOB, no stridor GI: ND, no obvious masses Neuro: Alert and awake  Medical Decision Making and Plan: Given the patient's initial medical screening exam, the following diagnostic evaluation has been ordered. The patient will be placed in the appropriate treatment space, once one is available, to complete the evaluation and treatment. I have discussed the plan of care with the patient and I have advised the patient that an ED physician or mid-level practitioner will reevaluate their condition after the test results have been received, as the results may give them additional insight into the type of treatment they may need.   Diagnostics: Labs, [place]   Treatments: none immediately   Concha Se, MD 07/17/21 1300

## 2021-07-17 NOTE — ED Provider Notes (Signed)
[facility] Emergency Department Provider Note  ____________________________________________   Event Date/Time   First MD Initiated Contact with Patient 07/17/21 1432     (approximate)  I have reviewed the triage vital signs and the nursing notes.   HISTORY  Chief Complaint Chest Pain   HPI Yesenia Wood is a 35 y.o. female with past medical history of deafness, chronic migraine headaches, fibromyalgia, HTN, HDL, GERD, ADHD, obesity, hypothyroidism and asthma who presents accompanied by mother for assessment of some substernal chest tightness and some tightness in her right calf that she states she noticed last night.  She state the pain initially improved in the chest but then seem to come back again today fairly severe.  She states that initial initially worse with breathing but then became worse all the time and was not related to breathing.  She denies any new headache, earache, sore throat, cough, fevers, vomiting, diarrhea, dysuria, abdominal pain, back pain or other extremity pain other than some tightness in the right calf.  No recent injuries or falls.  She has not taken any medications for symptoms.  Denies any other acute concerns at this time         Past Medical History:  Diagnosis Date   ADHD (attention deficit hyperactivity disorder)    B12 deficiency    Bell's palsy    right sided   Chest pain    a. 05/2015 ETT: elev HR/BP, but no acute ST/T changes; b. 09/2019 Cor CTA: Nl cors, Ca2+ = 0.   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   Fatigue    Fatty liver    Fibromyalgia    GERD (gastroesophageal reflux disease)    Headache    High triglycerides    Hyperglycemia    Hypertension    Hyperthyroidism    Moderate asthma    Obesity    Paresthesia    Premature birth     Patient Active Problem List   Diagnosis Date Noted   Sleep difficulties  04/30/2020   Medication nonadherence due to intolerance 12/31/2019   Numbness 12/26/2019   Tingling 12/26/2019   RLS (restless legs syndrome) 09/30/2019   MDD (major depressive disorder), recurrent episode, moderate (HCC) 07/17/2019   Social anxiety disorder 07/17/2019   Muteness 09/10/2018   OSA (obstructive sleep apnea) 10/10/2016   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   Dissocial personality disorder (HCC) 06/07/2016   Sleep disturbance 05/10/2016   Chronic migraine without aura 03/29/2016   Pain in the chest 06/09/2015   Palpitations 06/09/2015   Deaf 05/16/2015   B12 deficiency 05/16/2015   Cochlear implant status 05/16/2015   Fatty infiltration of liver 05/16/2015   Gastro-esophageal reflux disease without esophagitis 05/16/2015   Generalized headache 05/16/2015   Right-sided Bell's palsy 05/16/2015   H/O: depression 05/16/2015   Hypertriglyceridemia 05/16/2015   Irregular menstrual cycle 05/16/2015   Overweight 05/16/2015   Paresthesia 05/16/2015   Disorder of labyrinth 05/16/2015   Dyspnea 10/07/2014   Moderate persistent intrinsic asthma without status asthmaticus without complication 10/07/2014   Migraine 08/05/2013   Allergic rhinitis 07/01/2013   Obesity (BMI 30-39.9) 07/01/2013   Bartholin gland cyst 09/16/2008    Past Surgical History:  Procedure Laterality Date   BARTHOLIN GLAND CYST REMOVAL     COCHLEAR IMPLANT     COCHLEAR IMPLANT REMOVAL     DILATATION AND CURETTAGE/HYSTEROSCOPY  WITH MINERVA N/A 03/05/2021   Procedure: DILATATION AND CURETTAGE/HYSTEROSCOPY WITH MINERVA;  Surgeon: Hildred Laserherry, Anika, MD;  Location: [facility] ORS;  Service: Gynecology;  Laterality: N/A;   LAPAROSCOPIC TUBAL LIGATION Bilateral 03/26/2018   Procedure: LAPAROSCOPIC TUBAL LIGATION;  Surgeon: Hildred Laserherry, Anika, MD;  Location: [facility] ORS;  Service: Gynecology;  Laterality: Bilateral;  with peritoneal biopsies   LAPAROSCOPY     MYRINGOTOMY WITH TUBE PLACEMENT     TUBAL LIGATION  03/26/2018     Prior to Admission medications   Medication Sig Start Date End Date Taking? Authorizing Provider  acebutolol (SECTRAL) 200 MG capsule TAKE 1 CAPSULE BY MOUTH TWICE A DAY Patient taking differently: Take 200 mg by mouth 2 (two) times daily. 10/26/20   Alver SorrowWalker, Caitlin S, NP  acetaminophen (TYLENOL) 500 MG tablet Take 2 tablets (1,000 mg total) by mouth every 6 (six) hours as needed. 03/05/21 03/05/22  Hildred Laserherry, Anika, MD  acetaminophen (TYLENOL) 500 MG tablet Take 2 tablets (1,000 mg total) by mouth every 6 (six) hours as needed for moderate pain. 03/05/21   Hildred Laserherry, Anika, MD  BOTOX 100 units SOLR injection every 3 (three) months.  01/09/19   [provider]  calcium carbonate (TUMS - DOSED IN MG ELEMENTAL CALCIUM) 500 MG chewable tablet Chew 1 tablet by mouth daily as needed for indigestion or heartburn.    [provider]  Cyanocobalamin (VITAMIN B-12) 500 MCG SUBL Place 1 tablet (500 mcg total) under the tongue once a week. Patient taking differently: Place 1 tablet under the tongue once a week. Twice weekly 04/08/20   Alba CorySowles, Krichna, MD  diphenhydrAMINE (BENADRYL) 50 MG tablet Take 50 mg by mouth at bedtime as needed for itching.    [provider]  docusate sodium (COLACE) 100 MG capsule Take 100 mg by mouth daily as needed for mild constipation.    [provider]  ferrous sulfate 325 (65 FE) MG tablet Take 325 mg by mouth daily with breakfast.    [provider]  folic acid (FOLVITE) 800 MCG tablet Take 800 mcg by mouth daily. 800 mcg    [provider]  ibuprofen (ADVIL) 600 MG tablet TAKE 1 TABLET BY MOUTH EVERY 6 HOURS AS NEEDED 03/29/21   Hildred Laserherry, Anika, MD  rosuvastatin (CRESTOR) 20 MG tablet TAKE 1 TABLET BY MOUTH EVERY DAY 05/21/21   Alba CorySowles, Krichna, MD  simethicone (MYLICON) 125 MG chewable tablet Chew 125 mg by mouth every 6 (six) hours as needed for flatulence.    [provider]    Allergies Budesonide-formoterol fumarate,  Astelin [azelastine], Gabapentin, Nortriptyline, Lamictal [lamotrigine], Milnacipran, Tegaderm ag mesh [silver], Diclofenac, Duloxetine hcl, Montelukast sodium, Mupirocin, Naproxen, Naproxen sodium, Other, Oxycodone hcl, and Sulfa antibiotics  Family History  Adopted: Yes  Family history unknown: Yes    Social History Social History   Tobacco Use   Smoking status: Former    Packs/day: 1.00    Years: 2.00    Pack years: 2.00    Types: Cigarettes    Start date: 11/28/2004    Quit date: 11/28/2006    Years since quitting: 14.6   Smokeless tobacco: Never  Vaping Use   Vaping Use: Never used  Substance Use Topics   Alcohol use: Yes    Alcohol/week: 0.0 standard drinks    Comment: occasional    Drug use: No    Review of Systems  Review of Systems  Constitutional:  Negative for chills and fever.  HENT:  Negative for sore throat.   Eyes:  Negative for pain.  Respiratory:  Negative for cough and stridor.   Cardiovascular:  Positive for chest pain.  Gastrointestinal:  Negative for vomiting.  Genitourinary:  Negative for dysuria.  Musculoskeletal:  Positive for myalgias (R posterior calf).  Skin:  Negative for rash.  Neurological:  Positive for dizziness. Negative for seizures, loss of consciousness and headaches.  Psychiatric/Behavioral:  Negative for suicidal ideas.   All other systems reviewed and are negative.    ____________________________________________   PHYSICAL EXAM:  VITAL SIGNS: ED Triage Vitals  Enc Vitals Group     BP 07/17/21 1259 112/75     Pulse Rate 07/17/21 1259 76     Resp 07/17/21 1259 18     Temp 07/17/21 1259 99.4 F (37.4 C)     Temp Source 07/17/21 1259 Oral     SpO2 07/17/21 1259 95 %     Weight 07/17/21 1258 175 lb (79.4 kg)     Height 07/17/21 1258 5\' 3"  (1.6 m)     Head Circumference --      Peak Flow --      Pain Score 07/17/21 1257 6     Pain Loc --      Pain Edu? --      Excl. in GC? --    Vitals:   07/17/21 1259  BP: 112/75   Pulse: 76  Resp: 18  Temp: 99.4 F (37.4 C)  SpO2: 95%   Physical Exam Vitals and nursing note reviewed.  Constitutional:      General: She is not in acute distress.    Appearance: She is well-developed. She is obese.  HENT:     Head: Normocephalic and atraumatic.     Right Ear: External ear normal.     Left Ear: External ear normal.     Nose: Nose normal.  Eyes:     Conjunctiva/sclera: Conjunctivae normal.  Cardiovascular:     Rate and Rhythm: Normal rate and regular rhythm.     Heart sounds: No murmur heard. Pulmonary:     Effort: Pulmonary effort is normal. No respiratory distress.     Breath sounds: Normal breath sounds.  Abdominal:     Palpations: Abdomen is soft.     Tenderness: There is no abdominal tenderness.  Musculoskeletal:     Cervical back: Neck supple.     Right lower leg: No edema.     Left lower leg: No edema.  Skin:    General: Skin is warm and dry.     Capillary Refill: Capillary refill takes less than 2 seconds.  Neurological:     Mental Status: She is alert and oriented to person, place, and time.    Right posterior calf and lower leg is unremarkable.  No significant edema or overlying skin changes.  Patient has full strength throughout both lower extremities.  There is some mild tenderness palpation of the left costochondral joints.  No overlying skin changes. ____________________________________________   LABS (all labs ordered are listed, but only abnormal results are displayed)  Labs Reviewed  COMPREHENSIVE METABOLIC PANEL - Abnormal; Notable for the following components:      Result Value   ALT 61 (*)    All other components within normal limits  RESP PANEL BY RT-PCR (FLU A&B, COVID) ARPGX2  CBC WITH DIFFERENTIAL/PLATELET  D-DIMER, QUANTITATIVE  POC URINE PREG, ED  TROPONIN I (HIGH SENSITIVITY)   ____________________________________________  EKG  Sinus rhythm with ventricular rate of 78, normal axis, unremarkable intervals  without clear evidence  of acute ischemia or significant arrhythmia. ____________________________________________  RADIOLOGY  ED MD interpretation: Chest x-ray has no focal consolidation, effusion, edema, pneumothorax or other clear acute intrathoracic process.  Ultrasound of the right lower extremity shows no evidence of DVT, cyst or other acute process.  Official radiology report(s): DG Chest 2 View  Result Date: 07/17/2021 CLINICAL DATA:  Shortness of breath.  Chest pain. EXAM: CHEST - 2 VIEW COMPARISON:  04/11/2017 FINDINGS: The heart size and mediastinal contours are within normal limits. Both lungs are clear. The visualized skeletal structures are unremarkable. IMPRESSION: No active cardiopulmonary disease. Electronically Signed   By: Norva Pavlov M.D.   On: 07/17/2021 13:29   US Venous Img Lower Unilateral Right  Result Date: 07/17/2021 CLINICAL DATA:  Leg swelling. EXAM: RIGHT LOWER EXTREMITY VENOUS DOPPLER ULTRASOUND TECHNIQUE: Gray-scale sonography with compression, as well as color and duplex ultrasound, were performed to evaluate the deep venous system(s) from the level of the common femoral vein through the popliteal and proximal calf veins. COMPARISON:  12/03/2020 FINDINGS: VENOUS Normal compressibility of the common femoral, superficial femoral, and popliteal veins, as well as the visualized calf veins. Visualized portions of profunda femoral vein and great saphenous vein unremarkable. No filling defects to suggest DVT on grayscale or color Doppler imaging. Doppler waveforms show normal direction of venous flow, normal respiratory plasticity and response to augmentation. Limited views of the contralateral common femoral vein are unremarkable. OTHER None. Limitations: none IMPRESSION: Negative. Electronically Signed   By: Norva Pavlov M.D.   On: 07/17/2021 13:48    ____________________________________________   PROCEDURES  Procedure(s) performed (including Critical  Care):  Procedures   ____________________________________________   INITIAL IMPRESSION / ASSESSMENT AND PLAN / ED COURSE     Patient presents with above-stated history exam for assessment of 2 seemingly separate complaints.  First with regards to her chest discomfort he states began yesterday and then subsided before coming back earlier today.  Differential includes costochondritis, pneumonia, bronchitis, PE, arrhythmia, ACS, myocarditis, pericarditis and possible GI etiologies.  Chest x-ray has no focal consolidation, effusion, edema, pneumothorax or other clear acute intrathoracic process.  ECG and nonelevated troponin obtained greater than 3 hours after symptom onset are not suggestive of ACS, arrhythmia or myocarditis.  Low suspicion for PE as D-dimer is less than 0.5.  CBC shows no leukocytosis or acute anemia.  CMP shows no significant electrolyte or metabolic derangements.  Unclear etiology although costochondritis versus pleurisy are both still within differential.  We will give a dose of ibuprofen.  I believe patient is safe with regard to the symptoms to follow-up with her PCP.  I will send COVID PCR and advised patient she can follow-up this result online.  Second patient is complaining of some soreness in her right calf that seems to started yesterday.  No history or exam features to suggest recent trauma or findings on exam to suggest acute infectious process i.e. cellulitis necrotizing affection or proximal or distal joint infection.  Ultrasound shows no evidence of DVT or cyst.  Exam is unremarkable.  Unclear etiology at this time and I have a low suspicion for immediately life or limb threatening cause and certainly may be related to a muscle strain.  Given stable vitals with otherwise reassuring exam and work-up I believe patient is safe for discharge with close outpatient PCP follow-up.  Discharged in stable condition.  Strict return cautions advised and  discussed   ____________________________________________   FINAL CLINICAL IMPRESSION(S) / ED DIAGNOSES  Final diagnoses:  Chest  pain, unspecified type  Right calf pain    Medications  ibuprofen (ADVIL) tablet 400 mg (has no administration in time range)     ED Discharge Orders     None        Note:  This document was prepared using Dragon voice recognition software and may include unintentional dictation errors.    Gilles Chiquito, MD 07/17/21 [phone]

## 2021-07-18 ENCOUNTER — Encounter: Payer: Self-pay | Admitting: Emergency Medicine

## 2021-07-18 ENCOUNTER — Other Ambulatory Visit: Payer: Self-pay

## 2021-07-18 ENCOUNTER — Telehealth: Payer: Self-pay | Admitting: Home Health

## 2021-07-18 ENCOUNTER — Emergency Department
Admission: EM | Admit: 2021-07-18 | Discharge: 2021-07-19 | Disposition: A | Discharge status: Home / Self Care | Payer: Medicare Other | Attending: Emergency Medicine | Admitting: Emergency Medicine

## 2021-07-18 DIAGNOSIS — M79602 Pain in left arm: Secondary | ICD-10-CM | POA: Insufficient documentation

## 2021-07-18 DIAGNOSIS — M79662 Pain in left lower leg: Secondary | ICD-10-CM | POA: Insufficient documentation

## 2021-07-18 DIAGNOSIS — R202 Paresthesia of skin: Secondary | ICD-10-CM | POA: Insufficient documentation

## 2021-07-18 DIAGNOSIS — J4540 Moderate persistent asthma, uncomplicated: Secondary | ICD-10-CM | POA: Diagnosis not present

## 2021-07-18 DIAGNOSIS — R0789 Other chest pain: Secondary | ICD-10-CM | POA: Insufficient documentation

## 2021-07-18 DIAGNOSIS — I10 Essential (primary) hypertension: Secondary | ICD-10-CM | POA: Diagnosis not present

## 2021-07-18 DIAGNOSIS — Z79899 Other long term (current) drug therapy: Secondary | ICD-10-CM | POA: Diagnosis not present

## 2021-07-18 DIAGNOSIS — R110 Nausea: Secondary | ICD-10-CM | POA: Insufficient documentation

## 2021-07-18 DIAGNOSIS — Z87891 Personal history of nicotine dependence: Secondary | ICD-10-CM | POA: Diagnosis not present

## 2021-07-18 LAB — CBC
HCT: 40.4 % (ref 36.0–46.0)
Hemoglobin: 13.8 g/dL (ref 12.0–15.0)
MCH: 30.6 pg (ref 26.0–34.0)
MCHC: 34.2 g/dL (ref 30.0–36.0)
MCV: 89.6 fL (ref 80.0–100.0)
Platelets: 304 10*3/uL (ref 150–400)
RBC: 4.51 MIL/uL (ref 3.87–5.11)
RDW: 12.5 % (ref 11.5–15.5)
WBC: 8 10*3/uL (ref 4.0–10.5)
nRBC: 0 % (ref 0.0–0.2)

## 2021-07-18 LAB — BASIC METABOLIC PANEL
Anion gap: 8 (ref 5–15)
BUN: 14 mg/dL (ref 6–20)
CO2: 24 mmol/L (ref 22–32)
Calcium: 8.8 mg/dL — ABNORMAL LOW (ref 8.9–10.3)
Chloride: 104 mmol/L (ref 98–111)
Creatinine, Ser: 1.15 mg/dL — ABNORMAL HIGH (ref 0.44–1.00)
GFR, Estimated: >60 mL/min (ref >60)
Glucose, Bld: 105 mg/dL — ABNORMAL HIGH (ref 70–99)
Potassium: 3.8 mmol/L (ref 3.5–5.1)
Sodium: 136 mmol/L (ref 135–145)

## 2021-07-18 LAB — TROPONIN I (HIGH SENSITIVITY)
Troponin I (High Sensitivity): 3 ng/L (ref <18)
Troponin I (High Sensitivity): 2 ng/L (ref <18)

## 2021-07-18 MED ORDER — IBUPROFEN 800 MG PO TABS: 800.0000 mg | ORAL_TABLET | Freq: Three times a day (TID) | ORAL | 0 refills | Status: DC | PRN

## 2021-07-18 MED ORDER — ONDANSETRON 4 MG PO TBDP
4.0000 mg | ORAL_TABLET | Freq: Once | ORAL | Status: AC
  Administered 2021-07-18: 4 mg via ORAL
  Filled 2021-07-18: qty 1

## 2021-07-18 MED ORDER — ONDANSETRON 4 MG PO TBDP: 4.0000 mg | ORAL_TABLET | Freq: Four times a day (QID) | ORAL | 0 refills | Status: DC | PRN

## 2021-07-18 MED ORDER — KETOROLAC TROMETHAMINE 30 MG/ML IJ SOLN
60.0000 mg | Freq: Once | INTRAMUSCULAR | Status: AC
  Administered 2021-07-18: 60 mg via INTRAMUSCULAR
  Filled 2021-07-18: qty 2

## 2021-07-18 NOTE — Discharge Instructions (Addendum)
You may alternate Tylenol 1000 mg every 6 hours as needed for pain, fever and Ibuprofen 800 mg every 8 hours as needed for pain, fever.  Please take Ibuprofen with food.  Do not take more than 4000 mg of Tylenol (acetaminophen) in a 24 hour period.  Your EKG has been reassuring.  Chest x-ray was clear.  Your cardiac labs have been normal showing no sign of heart attack.  You have had blood test and an ultrasound of your right leg that showed no sign of blood clot in your lung or leg.  I suspect that your pain is more musculoskeletal in nature.  You may alternate between Tylenol and ibuprofen for pain.  A prescription for ibuprofen and a nausea medicine called Zofran have been sent to your pharmacy.  I recommend following up with your primary care physician and/or cardiologist if symptoms or not improving.

## 2021-07-18 NOTE — Telephone Encounter (Signed)
Patient called after hour service line complaining chest pain. She was at ER yesterday c/o chest pain, workup revealed negative troponin and EKG, D-dimer was negative, CBC and CMP grossly unremarkable.  Flu and COVID-19 negative.

## 2021-07-18 NOTE — Telephone Encounter (Signed)
Patient called after hour service line complaining chest pain. She was at ER yesterday c/o chest pain, workup revealed negative troponin and EKG, D-dimer was negative, CBC and CMP grossly unremarkable.  Flu and COVID-19 negative. She was discharged with ibuprofen for MSK pain. She states woke up with midsternal chest pain radiating to her left arm today again around 8 AM, pain lasted all day and is persistent currently.  She has taken several doses of ibuprofen without any relief.  She endorses associated dizziness, nausea.  She had taken her blood pressure which was normal.  Given numerous complaints and persistent chest pain, advised patient had to the nearest ER for in person evaluation before further advice or medication adjustment can be made.  Advised patient to have someone drive her to the ER, she states her parents may be able to drive her.  She is agreeable with above plan.

## 2021-07-18 NOTE — ED Provider Notes (Signed)
[facility] Emergency Department Provider Note  ____________________________________________   Event Date/Time   First MD Initiated Contact with Patient 07/18/21 2300     (approximate)  I have reviewed the triage vital signs and the nursing notes.   HISTORY  Chief Complaint Chest Pain    HPI Yesenia Wood is a 35 y.o. female with history of fibromyalgia, migraines, hypertension, hyperlipidemia, obesity who presents to the emergency department with complains of sharp left-sided chest pain for 2 days worse with inspiration.  States pain is now moving into the left arm and her left arm feels heavy and she is feeling tingling in her fingertips.  She denies any new shortness of breath, fevers, cough, vomiting.  Has had some nausea.  Also has had some right lower extremity calf pain but no injury.  No history of PE or DVT.  Had a negative coronary CT scan in 2020.  Is followed by Dr. Kirke Corin with cardiology.  Was seen in the emergency department yesterday for similar symptoms and had an unremarkable work-up including a negative D-dimer, negative troponins and a negative ultrasound of her right lower extremity.  States she tried 200 mg ibuprofen at home without any relief.  She called her cardiology office who recommended she come to the emergency department.  Pain is worse with palpation of her chest and also movement of the left arm.  No injury that she can recall.   Sign language interpreter used throughout visit.    Past Medical History:  Diagnosis Date   ADHD (attention deficit hyperactivity disorder)    B12 deficiency    Bell's palsy    right sided   Chest pain    a. 05/2015 ETT: elev HR/BP, but no acute ST/T changes; b. 09/2019 Cor CTA: Nl cors, Ca2+ = 0.   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   Fatigue    Fatty liver    Fibromyalgia    GERD (gastroesophageal  reflux disease)    Headache    High triglycerides    Hyperglycemia    Hypertension    Hyperthyroidism    Moderate asthma    Obesity    Paresthesia    Premature birth     Patient Active Problem List   Diagnosis Date Noted   Sleep difficulties 04/30/2020   Medication nonadherence due to intolerance 12/31/2019   Numbness 12/26/2019   Tingling 12/26/2019   RLS (restless legs syndrome) 09/30/2019   MDD (major depressive disorder), recurrent episode, moderate (HCC) 07/17/2019   Social anxiety disorder 07/17/2019   Muteness 09/10/2018   OSA (obstructive sleep apnea) 10/10/2016   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   Dissocial personality disorder (HCC) 06/07/2016   Sleep disturbance 05/10/2016   Chronic migraine without aura 03/29/2016   Pain in the chest 06/09/2015   Palpitations 06/09/2015   Deaf 05/16/2015   B12 deficiency 05/16/2015   Cochlear implant status 05/16/2015   Fatty infiltration of liver 05/16/2015   Gastro-esophageal reflux disease without esophagitis 05/16/2015   Generalized headache 05/16/2015   Right-sided Bell's palsy 05/16/2015   H/O: depression 05/16/2015   Hypertriglyceridemia 05/16/2015   Irregular menstrual cycle 05/16/2015   Overweight 05/16/2015   Paresthesia 05/16/2015   Disorder of labyrinth 05/16/2015   Dyspnea 10/07/2014   Moderate persistent intrinsic asthma without status asthmaticus without complication 10/07/2014   Migraine 08/05/2013   Allergic rhinitis 07/01/2013  Obesity (BMI 30-39.9) 07/01/2013   Bartholin gland cyst 09/16/2008    Past Surgical History:  Procedure Laterality Date   BARTHOLIN GLAND CYST REMOVAL     COCHLEAR IMPLANT     COCHLEAR IMPLANT REMOVAL     DILATATION AND CURETTAGE/HYSTEROSCOPY WITH MINERVA N/A 03/05/2021   Procedure: DILATATION AND CURETTAGE/HYSTEROSCOPY WITH MINERVA;  Surgeon: Hildred Laser, MD;  Location: [facility] ORS;  Service: Gynecology;  Laterality: N/A;   LAPAROSCOPIC TUBAL LIGATION Bilateral  03/26/2018   Procedure: LAPAROSCOPIC TUBAL LIGATION;  Surgeon: Hildred Laser, MD;  Location: [facility] ORS;  Service: Gynecology;  Laterality: Bilateral;  with peritoneal biopsies   LAPAROSCOPY     MYRINGOTOMY WITH TUBE PLACEMENT     TUBAL LIGATION  03/26/2018    Prior to Admission medications   Medication Sig Start Date End Date Taking? Authorizing Provider  ibuprofen (ADVIL) 800 MG tablet Take 1 tablet (800 mg total) by mouth every 8 (eight) hours as needed for mild pain. 07/18/21  Yes Cecia Egge, Baxter Hire N, DO  ondansetron (ZOFRAN ODT) 4 MG disintegrating tablet Take 1 tablet (4 mg total) by mouth every 6 (six) hours as needed for nausea or vomiting. 07/18/21  Yes Christorpher Hisaw, Baxter Hire N, DO  acebutolol (SECTRAL) 200 MG capsule TAKE 1 CAPSULE BY MOUTH TWICE A DAY Patient taking differently: Take 200 mg by mouth 2 (two) times daily. 10/26/20   Alver Sorrow, NP  acetaminophen (TYLENOL) 500 MG tablet Take 2 tablets (1,000 mg total) by mouth every 6 (six) hours as needed. 03/05/21 03/05/22  Hildred Laser, MD  acetaminophen (TYLENOL) 500 MG tablet Take 2 tablets (1,000 mg total) by mouth every 6 (six) hours as needed for moderate pain. 03/05/21   Hildred Laser, MD  BOTOX 100 units SOLR injection every 3 (three) months.  01/09/19   [provider]  calcium carbonate (TUMS - DOSED IN MG ELEMENTAL CALCIUM) 500 MG chewable tablet Chew 1 tablet by mouth daily as needed for indigestion or heartburn.    [provider]  Cyanocobalamin (VITAMIN B-12) 500 MCG SUBL Place 1 tablet (500 mcg total) under the tongue once a week. Patient taking differently: Place 1 tablet under the tongue once a week. Twice weekly 04/08/20   Alba Cory, MD  diphenhydrAMINE (BENADRYL) 50 MG tablet Take 50 mg by mouth at bedtime as needed for itching.    [provider]  docusate sodium (COLACE) 100 MG capsule Take 100 mg by mouth daily as needed for mild constipation.    [provider]  ferrous sulfate 325 (65  FE) MG tablet Take 325 mg by mouth daily with breakfast.    [provider]  folic acid (FOLVITE) 800 MCG tablet Take 800 mcg by mouth daily. 800 mcg    [provider]  rosuvastatin (CRESTOR) 20 MG tablet TAKE 1 TABLET BY MOUTH EVERY DAY 05/21/21   Alba Cory, MD  simethicone (MYLICON) 125 MG chewable tablet Chew 125 mg by mouth every 6 (six) hours as needed for flatulence.    [provider]    Allergies Budesonide-formoterol fumarate, Astelin [azelastine], Gabapentin, Nortriptyline, Lamictal [lamotrigine], Milnacipran, Tegaderm ag mesh [silver], Diclofenac, Duloxetine hcl, Montelukast sodium, Mupirocin, Naproxen, Naproxen sodium, Other, Oxycodone hcl, and Sulfa antibiotics  Family History  Adopted: Yes  Family history unknown: Yes    Social History Social History   Tobacco Use   Smoking status: Former    Packs/day: 1.00    Years: 2.00    Pack years: 2.00    Types: Cigarettes  Start date: 11/28/2004    Quit date: 11/28/2006    Years since quitting: 14.6   Smokeless tobacco: Never  Vaping Use   Vaping Use: Never used  Substance Use Topics   Alcohol use: Yes    Alcohol/week: 0.0 standard drinks    Comment: occasional    Drug use: No    Review of Systems Constitutional: No fever. Eyes: No visual changes. ENT: No sore throat. Cardiovascular: + chest pain. Respiratory: Denies shortness of breath. Gastrointestinal: No nausea, vomiting, diarrhea. Genitourinary: Negative for dysuria. Musculoskeletal: Negative for back pain. Skin: Negative for rash. Neurological: Negative for focal weakness or numbness.  ____________________________________________   PHYSICAL EXAM:  VITAL SIGNS: ED Triage Vitals  Enc Vitals Group     BP 07/18/21 1814 114/83     Pulse Rate 07/18/21 1814 79     Resp 07/18/21 1814 20     Temp 07/18/21 1814 99.3 F (37.4 C)     Temp Source 07/18/21 1814 Oral     SpO2 07/18/21 1814 99 %     Weight 07/18/21 1815 175 lb  (79.4 kg)     Height 07/18/21 1815 5\' 3"  (1.6 m)     Head Circumference --      Peak Flow --      Pain Score 07/18/21 1814 6     Pain Loc --      Pain Edu? --      Excl. in GC? --    CONSTITUTIONAL: Alert and oriented and responds appropriately to questions. Well-appearing; well-nourished HEAD: Normocephalic EYES: Conjunctivae clear, pupils appear equal, EOM appear intact ENT: normal nose; moist mucous membranes NECK: Supple, normal ROM CARD: RRR; S1 and S2 appreciated; no murmurs, no clicks, no rubs, no gallops CHEST:  Chest wall is tender to palpation over the left chest wall which reproduces her pain.  No crepitus, ecchymosis, erythema, warmth, rash or other lesions present.   RESP: Normal chest excursion without splinting or tachypnea; breath sounds clear and equal bilaterally; no wheezes, no rhonchi, no rales, no hypoxia or respiratory distress, speaking full sentences ABD/GI: Normal bowel sounds; non-distended; soft, non-tender, no rebound, no guarding, no peritoneal signs, no hepatosplenomegaly BACK: The back appears normal EXT: Normal ROM in all joints; no deformity noted, no edema; no cyanosis, left arm is nontender to palpation without deformity, no joint effusions, no redness or warmth, compartments are soft, 2+ left radial pulse, normal capillary refill; mild tenderness noted to right posterior calf without asymmetric swelling, redness or warmth, compartments of the right leg are soft, no joint effusion or bony deformity SKIN: Normal color for age and race; warm; no rash on exposed skin NEURO: Moves all extremities equally, normal speech, normal sensation diffusely, no drift, normal gait PSYCH: The patient's mood and manner are appropriate.  ____________________________________________   LABS (all labs ordered are listed, but only abnormal results are displayed)  Labs Reviewed  BASIC METABOLIC PANEL - Abnormal; Notable for the following components:      Result Value    Glucose, Bld 105 (*)    Creatinine, Ser 1.15 (*)    Calcium 8.8 (*)    All other components within normal limits  CBC  POC URINE PREG, ED  TROPONIN I (HIGH SENSITIVITY)  TROPONIN I (HIGH SENSITIVITY)   ____________________________________________  EKG   Date: 07/18/2021 17:59  Rate: 84  Rhythm: normal sinus rhythm  QRS Axis: normal  Intervals: normal  ST/T Wave abnormalities: normal  Conduction Disutrbances: none  Narrative Interpretation: unremarkable  ____________________________________________  RADIOLOGY Normajean BaxterI, Aaliayah Miao, personally viewed and evaluated these images (plain radiographs) as part of my medical decision making, as well as reviewing the written report by the radiologist.  ED MD interpretation:    Official radiology report(s): No results found.  ____________________________________________   PROCEDURES  Procedure(s) performed (including Critical Care):  Procedures    ____________________________________________   INITIAL IMPRESSION / ASSESSMENT AND PLAN / ED COURSE  As part of my medical decision making, I reviewed the following data within the electronic MEDICAL RECORD NUMBER History obtained from family, Nursing notes reviewed and incorporated, Interpreter needed, Labs reviewed , EKG interpreted , Old EKG reviewed, Old chart reviewed, Radiograph reviewed , Notes from prior ED visits, and [place] Controlled Substance Database         Patient here with atypical chest pain.  Seems to be musculoskeletal in nature.  No signs of ACS, PE, dissection, stroke today.  Chest x-ray obtained yesterday was reviewed by myself and shows no infiltrate, edema, pneumothorax, rib fracture.  She has had negative troponins yesterday and today and a negative D-dimer and venous Doppler of the right lower extremity yesterday.  Will give Toradol for pain control.  Will give Zofran as well.  Discussed with patient that she can take a higher dose of ibuprofen and alternate this  with Tylenol at home for pain control.  Patient and mother are comfortable with this plan.  They have outpatient PCP and cardiology follow-up.  I am very reassured that she is well-appearing with normal vital signs and normal work-ups yesterday and today.  I do not think any life-threatening pathology is present.  I feel she is safe to be discharged home.  Patient and family feel reassured.  At this time, I do not feel there is any life-threatening condition present. I have reviewed, interpreted and discussed all results (EKG, imaging, lab, urine as appropriate) and exam findings with patient/family. I have reviewed nursing notes and appropriate previous records.  I feel the patient is safe to be discharged home without further emergent workup and can continue workup as an outpatient as needed. Discussed usual and customary return precautions. Patient/family verbalize understanding and are comfortable with this plan.  Outpatient follow-up has been provided as needed. All questions have been answered.  ____________________________________________   FINAL CLINICAL IMPRESSION(S) / ED DIAGNOSES  Final diagnoses:  Chest wall pain     ED Discharge Orders          Ordered    ibuprofen (ADVIL) 800 MG tablet  Every 8 hours PRN        07/18/21 2332    ondansetron (ZOFRAN ODT) 4 MG disintegrating tablet  Every 6 hours PRN        07/18/21 2332            *Please note:  Helene ShoeLaurie J Espejo was evaluated in Emergency Department on 07/19/2021 for the symptoms described in the history of present illness. She was evaluated in the context of the global COVID-19 pandemic, which necessitated consideration that the patient might be at risk for infection with the SARS-CoV-2 virus that causes COVID-19. Institutional protocols and algorithms that pertain to the evaluation of patients at risk for COVID-19 are in a state of rapid change based on information released by regulatory bodies including the CDC and federal  and state organizations. These policies and algorithms were followed during the patient's care in the ED.  Some ED evaluations and interventions may be delayed as a result of limited staffing during  and the pandemic.*   Note:  This document was prepared using Dragon voice recognition software and may include unintentional dictation errors.    Deidre Carino, Layla Maw, DO 07/19/21 [phone]

## 2021-07-18 NOTE — ED Triage Notes (Addendum)
Pt via POV from home. Pt c/o CP since yesterday. Pt was seen and d/c yesterday. Pt states that the pain has gotten worse since yesterday and now it radiates to her L shoulder. They also did an [place] of her L leg that was negative for a DVT. Pt is A&Ox4 and NAD.   ASL interpreter used at this time.

## 2021-07-26 ENCOUNTER — Other Ambulatory Visit: Payer: Self-pay

## 2021-07-26 ENCOUNTER — Encounter: Payer: Self-pay | Admitting: Family Medicine

## 2021-07-26 ENCOUNTER — Ambulatory Visit (INDEPENDENT_AMBULATORY_CARE_PROVIDER_SITE_OTHER): Payer: Medicare Other | Admitting: Family Medicine

## 2021-07-26 VITALS — BP 116/78 | HR 79 | Temp 98.4°F | Ht 63.0 in | Wt 180.2 lb

## 2021-07-26 DIAGNOSIS — Z23 Encounter for immunization: Secondary | ICD-10-CM | POA: Diagnosis not present

## 2021-07-26 DIAGNOSIS — R002 Palpitations: Secondary | ICD-10-CM | POA: Diagnosis not present

## 2021-07-26 DIAGNOSIS — M25512 Pain in left shoulder: Secondary | ICD-10-CM

## 2021-07-26 DIAGNOSIS — F331 Major depressive disorder, recurrent, moderate: Secondary | ICD-10-CM | POA: Diagnosis not present

## 2021-07-26 DIAGNOSIS — M25519 Pain in unspecified shoulder: Secondary | ICD-10-CM | POA: Insufficient documentation

## 2021-07-26 DIAGNOSIS — E559 Vitamin D deficiency, unspecified: Secondary | ICD-10-CM | POA: Diagnosis not present

## 2021-07-26 DIAGNOSIS — K760 Fatty (change of) liver, not elsewhere classified: Secondary | ICD-10-CM

## 2021-07-26 DIAGNOSIS — E538 Deficiency of other specified B group vitamins: Secondary | ICD-10-CM | POA: Diagnosis not present

## 2021-07-26 MED ORDER — CYCLOBENZAPRINE HCL 5 MG PO TABS: 5.0000 mg | ORAL_TABLET | Freq: Three times a day (TID) | ORAL | 0 refills | Status: DC | PRN

## 2021-07-26 NOTE — Patient Instructions (Signed)
It was great to see you!  Our plans for today:  - Try ibuprofen and the muscle relaxer for your shoulder pain. Try the stretches below. - Make sure to wear your compression hose regularly and elevate your legs when sitting or laying to prevent swelling.  - We are checking some labs today, we will release these results to your MyChart.  Take care and seek immediate care sooner if you develop any concerns.   Dr. Linwood Dibbles

## 2021-07-26 NOTE — Progress Notes (Signed)
   SUBJECTIVE:   CHIEF COMPLAINT / HPI:   ER FOLLOW UP Hospital/facility: [facility] ED 8/20, 8/21 Diagnosis: chest pain, leg swelling Procedures/tests:  - negative cardiac w/u including troponin, D dimer - negative LE doppler US Consultants: none New medications: none Discharge instructions:  f/u with PCP - previously seen by Cardiology 08/2020 and 11/2020 for same. Recommended compression hose - not wearing compression hose at night, hurts. Trying to wear during the day.  - denies current pain, SOB, swelling.   L Shoulder pain - sharp, constant  - few weeks - worse with movement, lifting arm - denies injuries - rest makes better - ibuprofen helping a little  Labs - counselor recommended checking B12, vit D, thyroid levels. Also wants liver function checked, has h/o fatty liver with prior elevated ALT.   OBJECTIVE:   BP 116/78   Pulse 79   Temp 98.4 F (36.9 C)   Ht 5\' 3"  (1.6 m)   Wt 180 lb 3.2 oz (81.7 kg)   SpO2 98%   BMI 31.92 kg/m   Gen: well appearing, in NAD Card: RRR Lungs: CTAB MSK: Shoulder: symmetric, no rashes, swelling noted. Decreased AROM with abduction, flexion due to pain, full PROM. Preserved external rotation. TTP of trap and upper paraspinal musculature and pec muscles. Negative empty can Ext: WWP, no edema  ASSESSMENT/PLAN:   B12 deficiency Recheck levels  Shoulder pain Consistent with MSK etiology given hypertonicity and tenderness on exam. Likely related to chest wall pain previously evaluated in ED. No sx or exam findings to suggest rotator cuff pathology, fracture, infection. Recommend NSAID, muscle relaxer. Stretches provided. F/u prn.    Leg swelling No swelling on today's exam., currently asymptomatic. Reassuring thorough workup in ED. Recommend continued compression and elevation.   , DO

## 2021-07-26 NOTE — Assessment & Plan Note (Addendum)
Consistent with MSK etiology given hypertonicity and tenderness on exam. Likely related to chest wall pain previously evaluated in ED. No sx or exam findings to suggest rotator cuff pathology, fracture, infection. Recommend NSAID, muscle relaxer. Stretches provided. F/u prn.

## 2021-07-26 NOTE — Assessment & Plan Note (Signed)
Recheck levels 

## 2021-07-27 LAB — COMPLETE METABOLIC PANEL WITH GFR
AG Ratio: 2 (calc) (ref 1.0–2.5)
ALT: 39 U/L — ABNORMAL HIGH (ref 6–29)
AST: 27 U/L (ref 10–30)
Albumin: 4.7 g/dL (ref 3.6–5.1)
Alkaline phosphatase (APISO): 63 U/L (ref 31–125)
BUN: 10 mg/dL (ref 7–25)
CO2: 28 mmol/L (ref 20–32)
Calcium: 9.4 mg/dL (ref 8.6–10.2)
Chloride: 102 mmol/L (ref 98–110)
Creat: 0.72 mg/dL (ref 0.50–0.97)
Globulin: 2.3 g/dL (calc) (ref 1.9–3.7)
Glucose, Bld: 76 mg/dL (ref 65–99)
Potassium: 4.2 mmol/L (ref 3.5–5.3)
Sodium: 138 mmol/L (ref 135–146)
Total Bilirubin: 0.5 mg/dL (ref 0.2–1.2)
Total Protein: 7 g/dL (ref 6.1–8.1)
eGFR: 112 mL/min/{1.73_m2} (ref >=60)

## 2021-07-27 LAB — VITAMIN D 25 HYDROXY (VIT D DEFICIENCY, FRACTURES): Vit D, 25-Hydroxy: 17 ng/mL — ABNORMAL LOW (ref 30–100)

## 2021-07-27 LAB — VITAMIN B12: Vitamin B-12: 564 pg/mL (ref 200–1100)

## 2021-07-27 LAB — TSH: TSH: 2.31 mIU/L

## 2021-08-04 DIAGNOSIS — G43719 Chronic migraine without aura, intractable, without status migrainosus: Secondary | ICD-10-CM | POA: Diagnosis not present

## 2021-08-04 DIAGNOSIS — M542 Cervicalgia: Secondary | ICD-10-CM | POA: Diagnosis not present

## 2021-08-04 DIAGNOSIS — G518 Other disorders of facial nerve: Secondary | ICD-10-CM | POA: Diagnosis not present

## 2021-08-04 DIAGNOSIS — G43109 Migraine with aura, not intractable, without status migrainosus: Secondary | ICD-10-CM | POA: Diagnosis not present

## 2021-08-04 DIAGNOSIS — G43111 Migraine with aura, intractable, with status migrainosus: Secondary | ICD-10-CM | POA: Diagnosis not present

## 2021-08-04 DIAGNOSIS — M791 Myalgia, unspecified site: Secondary | ICD-10-CM | POA: Diagnosis not present

## 2021-08-16 ENCOUNTER — Encounter: Payer: Self-pay | Admitting: Nurse Practitioner

## 2021-08-16 ENCOUNTER — Other Ambulatory Visit: Payer: Self-pay

## 2021-08-16 ENCOUNTER — Ambulatory Visit (INDEPENDENT_AMBULATORY_CARE_PROVIDER_SITE_OTHER): Payer: Medicare Other | Admitting: Nurse Practitioner

## 2021-08-16 VITALS — BP 110/76 | HR 74 | Ht 63.0 in | Wt 180.6 lb

## 2021-08-16 DIAGNOSIS — R000 Tachycardia, unspecified: Secondary | ICD-10-CM

## 2021-08-16 DIAGNOSIS — R600 Localized edema: Secondary | ICD-10-CM

## 2021-08-16 DIAGNOSIS — R0789 Other chest pain: Secondary | ICD-10-CM | POA: Diagnosis not present

## 2021-08-16 NOTE — Patient Instructions (Addendum)
Medication Instructions:  No changes at this time.  *If you need a refill on your cardiac medications before your next appointment, please call your pharmacy*   Lab Work: None  If you have labs (blood work) drawn today and your tests are completely normal, you will receive your results only by: MyChart Message (if you have MyChart) OR A paper copy in the mail If you have any lab test that is abnormal or we need to change your treatment, we will call you to review the results.   Testing/Procedures: None   Follow-Up: At [facility], you and your health needs are our priority.  As part of our continuing mission to provide you with exceptional heart care, we have created designated Provider Care Teams.  These Care Teams include your primary Cardiologist (physician) and Advanced Practice Providers (APPs -  Physician Assistants and Nurse Practitioners) who all work together to provide you with the care you need, when you need it.   Your next appointment:   1 year(s)  The format for your next appointment:   In Person  Provider:   Lorine Bears, MD

## 2021-08-16 NOTE — Progress Notes (Signed)
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  Past Medical History    Past Medical History:  Diagnosis Date   ADHD (attention deficit hyperactivity disorder)    B12 deficiency    Bell's palsy    right sided   Chest pain    a. 05/2015 ETT: elev HR/BP, but no acute ST/T changes; b. 09/2019 Cor CTA: Nl cors, Ca2+ = 0.   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   Fatigue    Fatty liver    Fibromyalgia    GERD (gastroesophageal reflux disease)    Headache    High triglycerides    Hyperglycemia    Hypertension    Hyperthyroidism    Moderate asthma    Obesity    Paresthesia    Premature birth    Past Surgical History:  Procedure Laterality Date   BARTHOLIN GLAND CYST REMOVAL     COCHLEAR IMPLANT     COCHLEAR IMPLANT REMOVAL     DILATATION AND CURETTAGE/HYSTEROSCOPY WITH MINERVA N/A 03/05/2021   Procedure: DILATATION AND CURETTAGE/HYSTEROSCOPY WITH MINERVA;  Surgeon: Hildred Laser, MD;  Location: [facility];  Service: Gynecology;  Laterality: N/A;   LAPAROSCOPIC TUBAL LIGATION Bilateral 03/26/2018   Procedure: LAPAROSCOPIC TUBAL LIGATION;  Surgeon: Hildred Laser, MD;  Location: [facility];  Service: Gynecology;  Laterality: Bilateral;  with peritoneal biopsies   LAPAROSCOPY     MYRINGOTOMY WITH TUBE PLACEMENT     TUBAL LIGATION  03/26/2018    Allergies  Allergies  Allergen Reactions   Budesonide-Formoterol Fumarate  Anaphylaxis and Other (See Comments)    Other reaction(s): Other (See Comments) chest pain Chest pain chest pain   Astelin [Azelastine]     Tingling, numbness, nausea   Gabapentin Nausea And Vomiting   Nortriptyline Other (See Comments)    Other reaction(s): Other (See Comments)   Lamictal [Lamotrigine]     Self injurious thoughts   Milnacipran Other (See Comments)    Pressure in eyes   Tegaderm Ag Mesh [Silver] Itching   Diclofenac Other (See Comments)    Other reaction(s): Other (See Comments)   Duloxetine Hcl Other (See Comments)    Other reaction(s): Other (See Comments) Other reaction(s): unknown   Montelukast Sodium Other (See Comments)    Other reaction(s): wheezing/sob   Mupirocin Other (See Comments)   Naproxen Other (See Comments) and Rash    Other reaction(s): rash/itching   Naproxen Sodium Rash    mild rash   Other Rash   Oxycodone Hcl Rash   Sulfa Antibiotics Hives, Rash and Itching    History of Present Illness    35 year old female with above complex past medical history including inappropriate sinus tachycardia, normal coronary arteries by coronary CTA in November 2020, moderate asthma, fibromyalgia, 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  Cardiac history dates back to at least 2015, at which time she was experiencing chest pain and palpitations.  Echocardiogram at that time was normal.  Exercise treadmill testing in July 2016 showed no evidence of ischemia though she did have exaggerated heart rate and blood pressure response and was placed on diltiazem.  This was subsequently transitioned to metoprolol.  In in late 2020, she was evaluated for chest pain and underwent coronary CTA which showed a calcium score of 0 and normal coronary arteries.  Most recent echocardiogram in July 2021 showed normal LV function without significant valvular abnormalities.  In the setting of ongoing palpitations in September 2021,  metoprolol was discontinued in favor of acebutolol 200 mg twice daily.  This resulted in improvement in palpitations and stable heart rates in the 80-90 range.  She was last seen in cardiology clinic in January 2022, at which time she reported right calf pain and swelling that seem to worsen as the day progressed.  Lower extremity venous duplex was performed and was negative for DVT.  She was advised to use compression hose.  Patient was seen in the emergency department on August 28 and again on August 21 for chest pain.  Work-up notable for normal troponins, D-dimer, EKG, and chest x-ray.  ALT was mildly elevated at 61.  Pain was described as sharp and worse with inspiration.  Right lower extremity venous Doppler was negative.  She was treated with Toradol and discharged.  She has since followed up with primary care and prescribed NSAIDs and Flexeril for left shoulder pain.  Patient presents with a sign language interpreter today.  She has been trying to limit how often she takes medicine but notes there has not been any significant improvement in left shoulder and left chest pain.  It is tender and worse with rotation of the left shoulder.  In all, it has not limited her activities much as she continues to exercise several days a week without progression of symptoms.  She does have some dyspnea on exertion but notes that this has been stable and generally does not limit her activity.  As noted above, she continues to experience right calf swelling, typically at the end of the day.  She denies PND, orthopnea, dizziness, syncope, or early satiety.  She occasionally notes palpitations but overall, these have been stable.  Home Medications    Current Outpatient Medications  Medication Sig Dispense Refill   acebutolol (SECTRAL) 200 MG capsule TAKE 1 CAPSULE BY MOUTH TWICE A DAY 180 capsule 3   acetaminophen (TYLENOL) 500 MG tablet Take 2 tablets (1,000 mg total) by mouth every 6 (six) hours as needed. 100  tablet 2   BOTOX 100 units SOLR injection every 3 (three) months.      calcium carbonate (TUMS - DOSED IN MG ELEMENTAL CALCIUM) 500 MG chewable tablet Chew 1 tablet by mouth daily as needed for indigestion or heartburn.     Cyanocobalamin (VITAMIN B-12) 500 MCG SUBL Place 1 tablet (500 mcg total) under the tongue once a week. 30 tablet 0   cyclobenzaprine (FLEXERIL) 5 MG tablet Take 1 tablet (5 mg total) by mouth 3 (three) times daily as needed for muscle spasms. 30 tablet 0   diphenhydrAMINE (BENADRYL) 50 MG tablet Take 50 mg by mouth at bedtime as needed for itching.     docusate sodium (COLACE) 100 MG capsule Take 100 mg by mouth daily as needed for mild constipation.     ferrous sulfate 325 (65 FE) MG tablet Take 325 mg by mouth daily with breakfast.     folic acid (FOLVITE) 800 MCG tablet Take 800 mcg by mouth daily. 800  mcg     ibuprofen (ADVIL) 800 MG tablet Take 1 tablet (800 mg total) by mouth every 8 (eight) hours as needed for mild pain. 30 tablet 0   ondansetron (ZOFRAN ODT) 4 MG disintegrating tablet Take 1 tablet (4 mg total) by mouth every 6 (six) hours as needed for nausea or vomiting. 20 tablet 0   rosuvastatin (CRESTOR) 20 MG tablet TAKE 1 TABLET BY MOUTH EVERY DAY 90 tablet 1   simethicone (MYLICON) 125 MG chewable tablet Chew 125 mg by mouth every 6 (six) hours as needed for flatulence.     No current facility-administered medications for this visit.     Review of Systems    Recent ER visits for left shoulder and chest pain worse with palpation and rotation of the left shoulder.  She notes some dyspnea on exertion but also says she is able to exercise without significant limitations.  Occasional palpitations which she notes are well controlled.  She denies PND, orthopnea, dizziness, syncope, or early satiety.  She continues to note mild right calf swelling at the end of the day.  All other systems reviewed and are otherwise negative except as noted above.  Physical Exam     VS:  BP 110/76   Pulse 74   Ht 5\' 3"  (1.6 m)   Wt 180 lb 9.6 oz (81.9 kg)   SpO2 98%   BMI 31.99 kg/m  , BMI Body mass index is 31.99 kg/m.     GEN: Well nourished, well developed, in no acute distress. HEENT: Patient is deaf. Neck: Supple, no JVD, carotid bruits, or masses. Cardiac: RRR, no murmurs, rubs, or gallops. No clubbing, cyanosis, edema (both calves are equal in size).  Radials/PT 2+ and equal bilaterally.  Respiratory:  Respirations regular and unlabored, clear to auscultation bilaterally. GI: Soft, nontender, nondistended, BS + x 4. MS: no deformity or atrophy. Skin: warm and dry, no rash. Neuro:  Strength and sensation are intact. Psych: Normal affect.  Accessory Clinical Findings    ECG personally reviewed by me today -regular sinus rhythm, 74- no acute changes.  Lab Results  Component Value Date   WBC 8.0 07/18/2021   HGB 13.8 07/18/2021   HCT 40.4 07/18/2021   MCV 89.6 07/18/2021   PLT 304 07/18/2021   Lab Results  Component Value Date   CREATININE 0.72 07/26/2021   BUN 10 07/26/2021   NA 138 07/26/2021   K 4.2 07/26/2021   CL 102 07/26/2021   CO2 28 07/26/2021   Lab Results  Component Value Date   ALT 39 (H) 07/26/2021   AST 27 07/26/2021   ALKPHOS 64 07/17/2021   BILITOT 0.5 07/26/2021   Lab Results  Component Value Date   CHOL 85 10/09/2020   HDL 27 (L) 10/09/2020   LDLCALC 39 10/09/2020   TRIG 107 10/09/2020   CHOLHDL 3.1 10/09/2020    Lab Results  Component Value Date   HGBA1C 4.9 01/29/2018    Assessment & Plan    1.  Inappropriate sinus tachycardia: This is been stable on acebutolol therapy.  She notes occasional palpitations but no prolonged tachycardia.  2.  Left shoulder and musculoskeletal chest pain: Patient seen in the ED on 2 separate occasions in August related to this.  She has been managed with NSAIDs and muscle relaxers.  She has been trying avoid taking medicine for this and has not noticed any significant  improvement.  Overall however, this does not limit her activity.  We discussed that  cardiac work-up in the emergency department was unremarkable.  This, in combination with prior normal coronary CTA is very reassuring from a cardiac standpoint.  3.  Right calf swelling: Patient continues to notice at the end of the day.  She is no swelling at this time.  She has not been wearing compression socks and we discussed this today.  She has had 2 negative right lower extremity venous duplexes since the beginning of this year, last in August.  Reassurance provided.  If this remains bothersome, we might want to consider a trial off of acebutolol, as edema is listed as a side effect.  4.  Disposition: Follow-up in clinic in 1 year or sooner if necessary.   Nicolasa Ducking, NP 08/16/2021, 9:26 AM

## 2021-08-29 IMAGING — CT CT HEART MORP W/ CTA COR W/ SCORE W/ CA W/CM &/OR W/O CM
2 of 11 series · 7 of 20 positions shown, 8 images · non-contrast
Comparison: None.

Addendum:
CLINICAL DATA: 33 year old female with exertional dyspnea, deafness

EXAM:
Cardiac/Coronary  CTA
TECHNIQUE: The patient was scanned on a [employer] Force scanner.

[Series 21: multiphase % cta coronary 0.60 · axial · 0.37mm/px · z∈[+1921,+2000]mm · 4 of 5610 slices shown, 5 images]
[im 1122/5610  vessel]
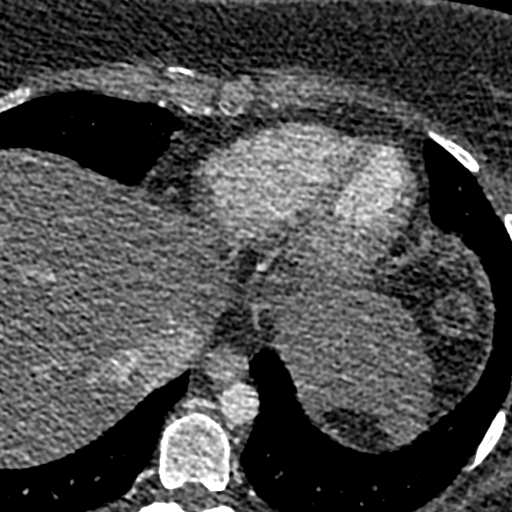
[im 1122/5610  lung]
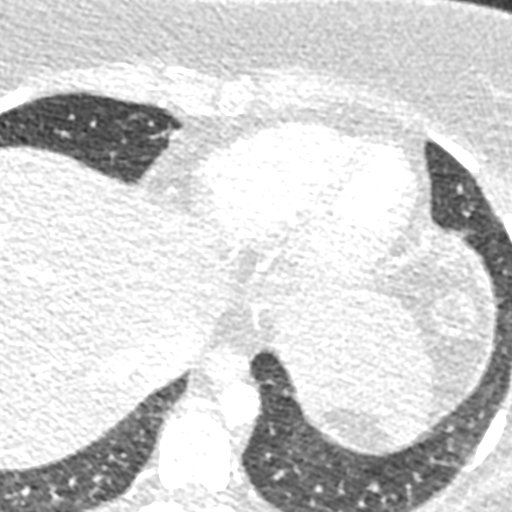
[im 2244/5610  vessel]
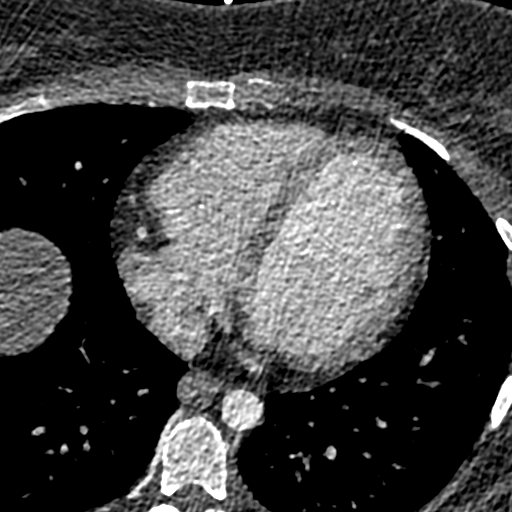
[im 3366/5610  vessel]
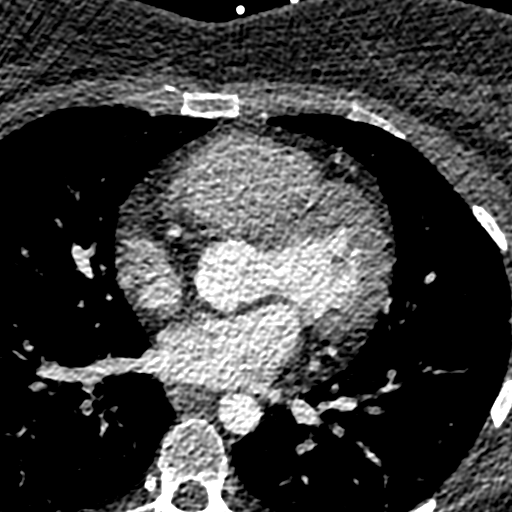
[im 4488/5610  vessel]
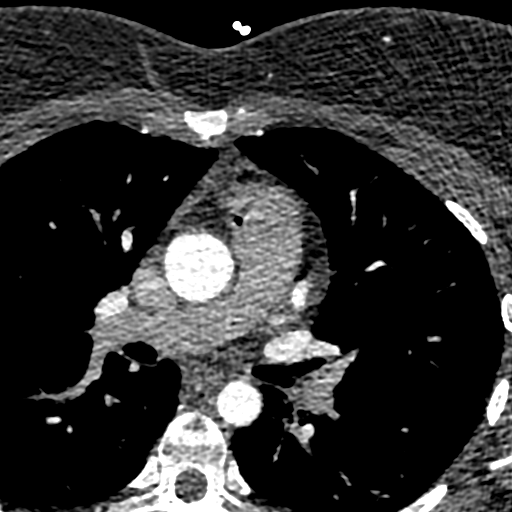

[Series 25: ms multiphase cta coronary 0.60 · axial · 0.37mm/px · z∈[+1927,+1993]mm · 3 of 4950 slices shown]
[im 1238/4950  vessel]
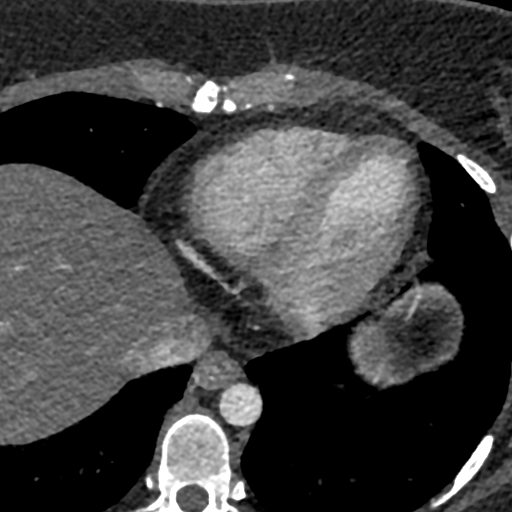
[im 2475/4950  vessel]
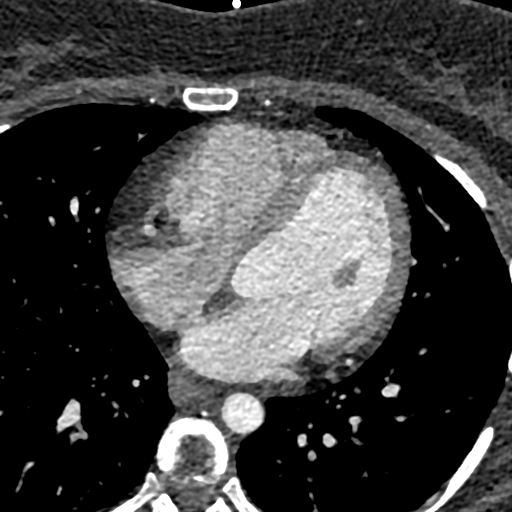
[im 3712/4950  vessel]
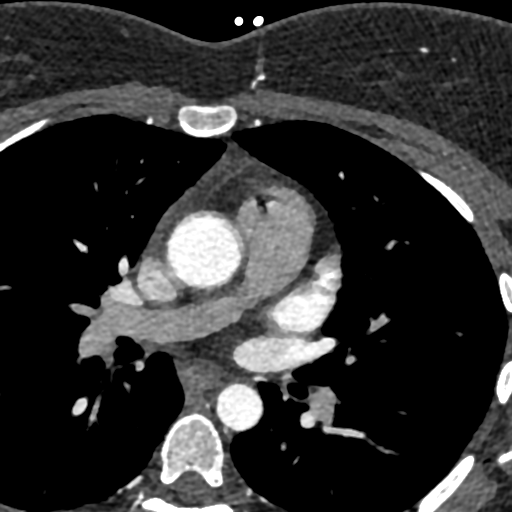

[7 of 20 positions shown; findings below may reference images not displayed]

FINDINGS: A 100 kV prospective scan was triggered in the descending thoracic
aorta at 111 HU's. Axial non-contrast 3 mm slices were carried out
through the heart. The data set was analyzed on a dedicated work
station and scored using the Agatson method. Gantry rotation speed
was 250 msecs and collimation was .6 mm. Beta blockade and 0.8 mg of
sl NTG was given. The 3D data set was reconstructed in 5% intervals
of the 67-82 % of the R-R cycle. Diastolic phases were analyzed on a
dedicated work station using MPR, MIP and VRT modes. The patient
received 80 cc of contrast.

Aorta:  Normal size.  No calcifications.  No dissection.

Aortic Valve:  Trileaflet.  No calcifications.

Coronary Arteries:  Normal coronary origin.  Right dominance.

RCA is a large dominant artery that gives rise to PDA and PLA. There
is no plaque.

Left main is a large artery that gives rise to LAD and LCX arteries.
There is one large diagonal branch

LAD is a large vessel that has no plaque.

LCX is a non-dominant artery that gives rise to one large OM1
branch. There is no plaque.

Other findings:

Normal pulmonary vein drainage into the left atrium.

Normal left atrial appendage without a thrombus.

Normal size of the pulmonary artery.
IMPRESSION: 1. Coronary calcium score of 0. This was 0 percentile for age and
sex matched control.

2. Normal coronary origin with right dominance.

3. No evidence of CAD. CAD-RADS 0.

EXAM:
OVER-READ INTERPRETATION  CT CHEST

The following report is an over-read performed by radiologist Dr.
Aidee Amari [[email or web address]] on 10/02/2019. This over-read
does not include interpretation of cardiac or coronary anatomy or
pathology. The coronary CTA interpretation by the cardiologist is
attached.
FINDINGS: Heart is normal size. Visualized aorta is normal caliber. No
adenopathy in the lower mediastinum or hila. Visualized lungs clear.
No effusions.

Imaging into the upper abdomen shows no acute findings. Chest wall
soft tissues are unremarkable. No acute bony abnormality.
IMPRESSION: No acute or significant extracardiac abnormality.

*** End of Addendum ***
FINDINGS: A 100 kV prospective scan was triggered in the descending thoracic
aorta at 111 HU's. Axial non-contrast 3 mm slices were carried out
through the heart. The data set was analyzed on a dedicated work
station and scored using the Agatson method. Gantry rotation speed
was 250 msecs and collimation was .6 mm. Beta blockade and 0.8 mg of
sl NTG was given. The 3D data set was reconstructed in 5% intervals
of the 67-82 % of the R-R cycle. Diastolic phases were analyzed on a
dedicated work station using MPR, MIP and VRT modes. The patient
received 80 cc of contrast.

Aorta:  Normal size.  No calcifications.  No dissection.

Aortic Valve:  Trileaflet.  No calcifications.

Coronary Arteries:  Normal coronary origin.  Right dominance.

RCA is a large dominant artery that gives rise to PDA and PLA. There
is no plaque.

Left main is a large artery that gives rise to LAD and LCX arteries.
There is one large diagonal branch

LAD is a large vessel that has no plaque.

LCX is a non-dominant artery that gives rise to one large OM1
branch. There is no plaque.

Other findings:

Normal pulmonary vein drainage into the left atrium.

Normal left atrial appendage without a thrombus.

Normal size of the pulmonary artery.
IMPRESSION: 1. Coronary calcium score of 0. This was 0 percentile for age and
sex matched control.

2. Normal coronary origin with right dominance.

3. No evidence of CAD. CAD-RADS 0.

## 2021-09-02 ENCOUNTER — Ambulatory Visit: Payer: Medicare Other | Admitting: Licensed Clinical Social Worker

## 2021-09-05 ENCOUNTER — Encounter: Payer: Self-pay | Admitting: Family Medicine

## 2021-09-09 ENCOUNTER — Ambulatory Visit: Payer: Medicare Other | Admitting: Psychiatry

## 2021-09-20 DIAGNOSIS — G43111 Migraine with aura, intractable, with status migrainosus: Secondary | ICD-10-CM | POA: Diagnosis not present

## 2021-09-20 DIAGNOSIS — G518 Other disorders of facial nerve: Secondary | ICD-10-CM | POA: Diagnosis not present

## 2021-09-20 DIAGNOSIS — M542 Cervicalgia: Secondary | ICD-10-CM | POA: Diagnosis not present

## 2021-09-20 DIAGNOSIS — G43109 Migraine with aura, not intractable, without status migrainosus: Secondary | ICD-10-CM | POA: Diagnosis not present

## 2021-09-20 DIAGNOSIS — M791 Myalgia, unspecified site: Secondary | ICD-10-CM | POA: Diagnosis not present

## 2021-09-20 DIAGNOSIS — G43719 Chronic migraine without aura, intractable, without status migrainosus: Secondary | ICD-10-CM | POA: Diagnosis not present

## 2021-10-06 ENCOUNTER — Telehealth: Payer: Self-pay | Admitting: Licensed Clinical Social Worker

## 2021-10-06 NOTE — Telephone Encounter (Signed)
LCSW counselor called pt and left message on voice mail to let her know that tomorrow's counseling session at 11am was scheduled as a face to face visit.  There are no interpreters available for this appointment. Instructed pt that we could make session an audio visit (other sessions have been audio).  LCSW counselor will call pt at appointed time: 11am.  Instructed pt to call our office if she has any additional questions or concerns.

## 2021-10-07 ENCOUNTER — Ambulatory Visit (INDEPENDENT_AMBULATORY_CARE_PROVIDER_SITE_OTHER): Payer: Medicare Other | Admitting: Licensed Clinical Social Worker

## 2021-10-07 ENCOUNTER — Other Ambulatory Visit: Payer: Self-pay

## 2021-10-07 DIAGNOSIS — F4010 Social phobia, unspecified: Secondary | ICD-10-CM

## 2021-10-07 DIAGNOSIS — F331 Major depressive disorder, recurrent, moderate: Secondary | ICD-10-CM | POA: Diagnosis not present

## 2021-10-07 NOTE — Progress Notes (Signed)
Virtual Visit via Audio Note  I connected with Yesenia Wood on 10/07/21 at 11:00 AM EST by an audio enabled telemedicine application and verified that I am speaking with the correct person using two identifiers. Conversation facilitated by audio sign language interpreter.  Location: Patient: home Provider: [facility]   I discussed the limitations of evaluation and management by telemedicine and the availability of in person appointments. The patient expressed understanding and agreed to proceed.  I discussed the assessment and treatment plan with the patient. The patient was provided an opportunity to ask questions and all were answered. The patient agreed with the plan and demonstrated an understanding of the instructions.   The patient was advised to call back or seek an in-person evaluation if the symptoms worsen or if the condition fails to improve as anticipated.  I provided 60 minutes of non-face-to-face time during this encounter.   Yesenia Ates R Fujie Dickison, LCSW   THERAPIST PROGRESS NOTE  Session Time: 11a-12p  Participation Level: Active  Behavioral Response: NAAlertAnxious  Type of Therapy: Individual Therapy  Treatment Goals addressed:  Problem: Decrease depressive symptoms and improve levels of effective functioning Goal: LTG: Reduce frequency, intensity, and duration of depression symptoms as evidenced by: pt self report Outcome: Progressing  Goal: STG: Carin WILL PARTICIPATE IN AT LEAST 80% OF SCHEDULED INDIVIDUAL PSYCHOTHERAPY SESSIONS Outcome: Progressing  Problem: Reduce overall frequency, intensity, and duration of the anxiety so that daily functioning is not impaired. Goal: LTG: Patient will score less than 5 on the Generalized Anxiety Disorder 7 Scale (GAD-7)  Outcome: Not Progressing Goal: STG: Patient will participate in at least 80% of scheduled individual psychotherapy sessions Outcome: Progressing  Interventions:  Intervention: Encourage changes to  improve social interaction Intervention: REVIEW PLEASE SKILLS (TREAT PHYSICAL ILLNESS, BALANCE EATING, AVOID MOOD-ALTERING SUBSTANCES, BALANCE SLEEP AND GET EXERCISE) WITH Yesenia Wood   Intervention: Assist with relaxation techniques, as appropriate (deep breathing exercises, meditation, guided imagery) Intervention: Work with patient to identify the major components of a recent episode of anxiety: physical symptoms, major thoughts and images, and major behaviors they experienced Intervention: Encourage patient to identify triggers Intervention: Assist with coping skills and behavior  Summary: Yesenia Wood is a 35 y.o. female who presents with improving symptoms related to depression. Pt reports that overall mood has improved since last session. Pt reports that she is getting fair quality and quantity of sleep.  Allowed pt to explore and express thoughts and feelings associated with recent life situations and external stressors. Pt reports that she recently found out that she was Vit D deficient, so she is currently taking supplements. Pt feels that she feels that they are working well and that her overall energy levels are improving.Pt reports that she is trying to be intentional about engaging in more activities through the day. Discussed recreational/leisure activities and pt states that she is enjoying to read and has read several books recently that she has enjoyed. Pt reports that she continues to have worries about potentially going out and meeting people--but gets anxious just thinking about it.  Used CBT/Exposure therapy to have pt mange anxiety symptoms that manifest when pt thinks about going out socially.  Continued recommendations are as follows: self care behaviors, positive social engagements, focusing on overall work/home/life balance, and focusing on positive physical and emotional wellness.    Suicidal/Homicidal: No  Therapist Response: Pt is continuing to apply interventions learned  in session into daily life situations. Pt is currently on track to meet goals utilizing interventions mentioned  above. Personal growth and progress noted. Treatment to continue as indicated.   Plan: Return again in 4 weeks.  Diagnosis: Axis I: MDD, recurrent, moderate    Axis II: No diagnosis    Yesenia Haber Gordie Crumby, LCSW 10/07/2021

## 2021-10-07 NOTE — Plan of Care (Signed)
  Problem: Decrease depressive symptoms and improve levels of effective functioning Goal: LTG: Reduce frequency, intensity, and duration of depression symptoms as evidenced by: pt self report Outcome: Progressing Goal: STG: Lashan WILL PARTICIPATE IN AT LEAST 80% OF SCHEDULED INDIVIDUAL PSYCHOTHERAPY SESSIONS Outcome: Progressing Intervention: Encourage changes to improve social interaction Intervention: REVIEW PLEASE SKILLS (TREAT PHYSICAL ILLNESS, BALANCE EATING, AVOID MOOD-ALTERING SUBSTANCES, BALANCE SLEEP AND GET EXERCISE) WITH Jacki Cones   Problem: Reduce overall frequency, intensity, and duration of the anxiety so that daily functioning is not impaired. Goal: LTG: Patient will score less than 5 on the Generalized Anxiety Disorder 7 Scale (GAD-7) Outcome: Not Progressing Goal: STG: Patient will participate in at least 80% of scheduled individual psychotherapy sessions Outcome: Progressing Intervention: Assist with relaxation techniques, as appropriate (deep breathing exercises, meditation, guided imagery) Intervention: Work with patient to identify the major components of a recent episode of anxiety: physical symptoms, major thoughts and images, and major behaviors they experienced Intervention: Encourage patient to identify triggers Intervention: Assist with coping skills and behavior

## 2021-10-14 ENCOUNTER — Encounter: Payer: Self-pay | Admitting: Family Medicine

## 2021-10-14 ENCOUNTER — Other Ambulatory Visit: Payer: Self-pay | Admitting: Family

## 2021-10-14 NOTE — Telephone Encounter (Signed)
Rx(s) sent to pharmacy electronically.  

## 2021-10-15 DIAGNOSIS — H11121 Conjunctival concretions, right eye: Secondary | ICD-10-CM | POA: Diagnosis not present

## 2021-10-28 ENCOUNTER — Encounter: Payer: Self-pay | Admitting: Internal Medicine

## 2021-10-28 ENCOUNTER — Ambulatory Visit (INDEPENDENT_AMBULATORY_CARE_PROVIDER_SITE_OTHER): Payer: Medicare Other | Admitting: Internal Medicine

## 2021-10-28 ENCOUNTER — Ambulatory Visit
Admission: RE | Admit: 2021-10-28 | Discharge: 2021-10-28 | Disposition: A | Discharge status: Home / Self Care | Payer: Medicare Other | Source: Ambulatory Visit | Attending: Internal Medicine | Admitting: Internal Medicine

## 2021-10-28 ENCOUNTER — Telehealth: Payer: Self-pay

## 2021-10-28 ENCOUNTER — Ambulatory Visit
Admission: RE | Admit: 2021-10-28 | Discharge: 2021-10-28 | Disposition: A | Discharge status: Home / Self Care | Payer: Medicare Other | Attending: Internal Medicine | Admitting: Internal Medicine

## 2021-10-28 ENCOUNTER — Other Ambulatory Visit: Payer: Self-pay

## 2021-10-28 VITALS — BP 122/84 | HR 86 | Temp 98.5°F | Resp 16 | Ht 63.0 in | Wt 177.3 lb

## 2021-10-28 DIAGNOSIS — R768 Other specified abnormal immunological findings in serum: Secondary | ICD-10-CM

## 2021-10-28 DIAGNOSIS — M25512 Pain in left shoulder: Secondary | ICD-10-CM

## 2021-10-28 DIAGNOSIS — G8929 Other chronic pain: Secondary | ICD-10-CM

## 2021-10-28 DIAGNOSIS — M546 Pain in thoracic spine: Secondary | ICD-10-CM | POA: Diagnosis not present

## 2021-10-28 DIAGNOSIS — E559 Vitamin D deficiency, unspecified: Secondary | ICD-10-CM

## 2021-10-28 DIAGNOSIS — M797 Fibromyalgia: Secondary | ICD-10-CM

## 2021-10-28 DIAGNOSIS — R110 Nausea: Secondary | ICD-10-CM | POA: Diagnosis not present

## 2021-10-28 MED ORDER — TIZANIDINE HCL 2 MG PO CAPS: 2.0000 mg | ORAL_CAPSULE | Freq: Three times a day (TID) | ORAL | 0 refills | Status: DC

## 2021-10-28 MED ORDER — ONDANSETRON 4 MG PO TBDP: 4.0000 mg | ORAL_TABLET | Freq: Four times a day (QID) | ORAL | 0 refills | Status: DC | PRN

## 2021-10-28 NOTE — Progress Notes (Signed)
Acute Office Visit  Subjective:    Patient ID: Yesenia Wood, female    DOB: Aug 06, 1986, 35 y.o.   MRN: [id number]  Chief Complaint  Patient presents with   Shoulder Pain   Back Pain    mid   Ankle Pain    Left and worsening   Cyst    Left lower leg    HPI Patient is in today for shoulder pain, mid-back pain, ankle pain. Interpreter presenting via computer to help with communication. She was seen for shoulder pain in August 2022 and was recommended anti-inflammatory medication and muscle relaxer and was given stretches to do at home. Reviewed blood work from that time, TSH, Vitamin B12 reassuring. Vitamin D low at 17, ALT elevated to 39 but down from previous check. Today she states that the shoulder pain is worse again and the medications did not help. The stretches seemed to help somewhat. She has never had imaging for this issue. She primarily has pain over the spine of the scapula although it will radiate forward into the [facility] joint. It is worse with movement and abduction/flexion of the shoulder. This has been going on 3-4 months but nothing makes it better. No acute trauma.  Mid-Thoracic Back Pain: had a bad mechanical fall a few weeks ago, which made pain worse. Pain from the level of T3-T8 midline but radiates outward. NSAIDs and muscle relaxer did not help. Never had imaging. Worse with walking to the point that she has to carry around a stool to sit down and rest. Pain is sharp in nature.   Left ankle pain: for 1-2 years, history of sprain but hurting again, no recent trauma. Can weight bear. Does have cyst on left calf that has been there since fall, did have a large bruise but that has resolved.   Diagnosed with fibromyalgia by Rheumatologist in [place], doesn't have one here. No autoimmune workup.   Past Medical History:  Diagnosis Date   ADHD (attention deficit hyperactivity disorder)    B12 deficiency    Bell's palsy    right sided   Chest pain    a. 05/2015 ETT: elev  HR/BP, but no acute ST/T changes; b. 09/2019 Cor CTA: Nl cors, Ca2+ = 0.   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   Fatigue    Fatty liver    Fibromyalgia    GERD (gastroesophageal reflux disease)    Headache    High triglycerides    Hyperglycemia    Hypertension    Hyperthyroidism    Moderate asthma    Obesity    Paresthesia    Premature birth     Past Surgical History:  Procedure Laterality Date   BARTHOLIN GLAND CYST REMOVAL     COCHLEAR IMPLANT     COCHLEAR IMPLANT REMOVAL     DILATATION AND CURETTAGE/HYSTEROSCOPY WITH MINERVA N/A 03/05/2021   Procedure: DILATATION AND CURETTAGE/HYSTEROSCOPY WITH MINERVA;  Surgeon: Rubie Maid, MD;  Location: [facility] ORS;  Service: Gynecology;  Laterality: N/A;   LAPAROSCOPIC TUBAL LIGATION Bilateral 03/26/2018   Procedure: LAPAROSCOPIC TUBAL LIGATION;  Surgeon: Rubie Maid, MD;  Location: [facility] OR[facility];  Service: Gynecology;  Laterality: Bilateral;  with peritoneal biopsies   LAPAROSCOPY     MYRINGOTOMY WITH TUBE PLACEMENT     TUBAL LIGATION  03/26/2018    Family History  Adopted: Yes  Family history unknown: Yes  Social History   Socioeconomic History   Marital status: Single    Spouse name: Not on file   Number of children: 0   Years of education: Not on file   Highest education level: Some college, no degree  Occupational History   Occupation: disability for hearing loss  Tobacco Use   Smoking status: Former    Packs/day: 1.00    Years: 2.00    Pack years: 2.00    Types: Cigarettes    Start date: 11/28/2004    Quit date: 11/28/2006    Years since quitting: 14.9   Smokeless tobacco: Never  Vaping Use   Vaping Use: Never used  Substance and Sexual Activity   Alcohol use: Yes    Alcohol/week: 0.0 standard drinks    Comment: occasional    Drug use: No   Sexual activity: Not Currently    Birth control/protection: Pill    Comment:  Seasonale  Other Topics Concern   Not on file  Social History Narrative   Lives with her adopted  parents   Hearing loss since birth    Social Determinants of Health   Financial Resource Strain: Low Risk    Difficulty of Paying Living Expenses: Not very hard  Food Insecurity: No Food Insecurity   Worried About Charity fundraiser in the Last Year: Never true   Ran Out of Food in the Last Year: Never true  Transportation Needs: Unmet Transportation Needs   Lack of Transportation (Medical): Yes   Lack of Transportation (Non-Medical): No  Physical Activity: Insufficiently Active   Days of Exercise per Week: 4 days   Minutes of Exercise per Session: 20 min  Stress: No Stress Concern Present   Feeling of Stress : Only a little  Social Connections: Socially Isolated   Frequency of Communication with Friends and Family: Once a week   Frequency of Social Gatherings with Friends and Family: Never   Attends Religious Services: Never   Printmaker: No   Attends Music therapist: Never   Marital Status: Never married  Human resources officer Violence: Not At Risk   Fear of Current or Ex-Partner: No   Emotionally Abused: No   Physically Abused: No   Sexually Abused: No    Outpatient Medications Prior to Visit  Medication Sig Dispense Refill   acebutolol (SECTRAL) 200 MG capsule TAKE 1 CAPSULE BY MOUTH TWICE A DAY 180 capsule 2   acetaminophen (TYLENOL) 500 MG tablet Take 2 tablets (1,000 mg total) by mouth every 6 (six) hours as needed. 100 tablet 2   BOTOX 100 units SOLR injection every 3 (three) months.      calcium carbonate (TUMS - DOSED IN MG ELEMENTAL CALCIUM) 500 MG chewable tablet Chew 1 tablet by mouth daily as needed for indigestion or heartburn.     Cyanocobalamin (VITAMIN B-12) 500 MCG SUBL Place 1 tablet (500 mcg total) under the tongue once a week. 30 tablet 0   cyclobenzaprine (FLEXERIL) 5 MG tablet Take 1 tablet (5 mg total) by mouth 3  (three) times daily as needed for muscle spasms. 30 tablet 0   diphenhydrAMINE (BENADRYL) 50 MG tablet Take 50 mg by mouth at bedtime as needed for itching.     docusate sodium (COLACE) 100 MG capsule Take 100 mg by mouth daily as needed for mild constipation.     ferrous sulfate 325 (65 FE) MG tablet Take 325 mg by mouth daily with breakfast.  folic acid (FOLVITE) 979 MCG tablet Take 800 mcg by mouth daily. 800 mcg     ibuprofen (ADVIL) 800 MG tablet Take 1 tablet (800 mg total) by mouth every 8 (eight) hours as needed for mild pain. 30 tablet 0   ondansetron (ZOFRAN ODT) 4 MG disintegrating tablet Take 1 tablet (4 mg total) by mouth every 6 (six) hours as needed for nausea or vomiting. 20 tablet 0   rosuvastatin (CRESTOR) 20 MG tablet TAKE 1 TABLET BY MOUTH EVERY DAY 90 tablet 1   simethicone (MYLICON) 480 MG chewable tablet Chew 125 mg by mouth every 6 (six) hours as needed for flatulence.     No facility-administered medications prior to visit.    Allergies  Allergen Reactions   Budesonide-Formoterol Fumarate Anaphylaxis and Other (See Comments)    Other reaction(s): Other (See Comments) chest pain Chest pain chest pain   Astelin [Azelastine]     Tingling, numbness, nausea   Gabapentin Nausea And Vomiting   Nortriptyline Other (See Comments)    Other reaction(s): Other (See Comments)   Lamictal [Lamotrigine]     Self injurious thoughts   Milnacipran Other (See Comments)    Pressure in eyes   Tegaderm Ag Mesh [Silver] Itching   Diclofenac Other (See Comments)    Other reaction(s): Other (See Comments)   Duloxetine Hcl Other (See Comments)    Other reaction(s): Other (See Comments) Other reaction(s): unknown   Montelukast Sodium Other (See Comments)    Other reaction(s): wheezing/sob   Mupirocin Other (See Comments)   Naproxen Other (See Comments) and Rash    Other reaction(s): rash/itching   Naproxen Sodium Rash    mild rash   Other Rash   Oxycodone Hcl Rash   Sulfa  Antibiotics Hives, Rash and Itching    Review of Systems  Constitutional:  Positive for fatigue. Negative for chills and fever.  Eyes:  Negative for visual disturbance.  Respiratory:  Negative for cough.   Cardiovascular:  Negative for chest pain.  Gastrointestinal:  Negative for abdominal pain.  Musculoskeletal:  Positive for back pain and myalgias. Negative for joint swelling, neck pain and neck stiffness.  Skin: Negative.   Neurological:  Positive for weakness. Negative for numbness.      Objective:    Physical Exam Constitutional:      Appearance: Normal appearance.  HENT:     Head: Normocephalic and atraumatic.  Eyes:     Conjunctiva/sclera: Conjunctivae normal.  Cardiovascular:     Rate and Rhythm: Normal rate and regular rhythm.  Pulmonary:     Effort: Pulmonary effort is normal.     Breath sounds: Normal breath sounds.  Musculoskeletal:        General: Tenderness present.     Right lower leg: No edema.     Left lower leg: No edema.     Comments: 4/5 weakness of left shoulder with abduction, flexion, extension, external rotation. Pain to palpation over [facility] joint and spine of the scapula. Hawkins test positive.   Good ROM of left ankle.   Skin:    General: Skin is warm and dry.     Comments: Healing hematoma in left calf.   Neurological:     General: No focal deficit present.     Mental Status: She is alert. Mental status is at baseline.  Psychiatric:        Mood and Affect: Mood normal.        Behavior: Behavior normal.    BP 122/84  Pulse 86   Temp 98.5 F (36.9 C)   Resp 16   Ht '5\' 3"'  (1.6 m)   Wt 177 lb 4.8 oz (80.4 kg)   SpO2 97%   BMI 31.41 kg/m  Wt Readings from Last 3 Encounters:  08/16/21 180 lb 9.6 oz (81.9 kg)  07/26/21 180 lb 3.2 oz (81.7 kg)  07/18/21 175 lb (79.4 kg)    Health Maintenance Due  Topic Date Due   COVID-19 Vaccine (5 - Booster for Pfizer series) 07/16/2021    There are no preventive care reminders to display for this  patient.   Lab Results  Component Value Date   TSH 2.31 07/26/2021   Lab Results  Component Value Date   WBC 8.0 07/18/2021   HGB 13.8 07/18/2021   HCT 40.4 07/18/2021   MCV 89.6 07/18/2021   PLT 304 07/18/2021   Lab Results  Component Value Date   NA 138 07/26/2021   K 4.2 07/26/2021   CO2 28 07/26/2021   GLUCOSE 76 07/26/2021   BUN 10 07/26/2021   CREATININE 0.72 07/26/2021   BILITOT 0.5 07/26/2021   ALKPHOS 64 07/17/2021   AST 27 07/26/2021   ALT 39 (H) 07/26/2021   PROT 7.0 07/26/2021   ALBUMIN 4.3 07/17/2021   CALCIUM 9.4 07/26/2021   ANIONGAP 8 07/18/2021   EGFR 112 07/26/2021   Lab Results  Component Value Date   CHOL 85 10/09/2020   Lab Results  Component Value Date   HDL 27 (L) 10/09/2020   Lab Results  Component Value Date   LDLCALC 39 10/09/2020   Lab Results  Component Value Date   TRIG 107 10/09/2020   Lab Results  Component Value Date   CHOLHDL 3.1 10/09/2020   Lab Results  Component Value Date   HGBA1C 4.9 01/29/2018       Assessment & Plan:   1. Fibromyalgia/ Chronic bilateral thoracic back pain/Acute pain of left shoulder/Other chronic pain: I do think her symptoms are due to fibromyalgia flare. Since she did have a fall that triggered the pain and she's never had imaging, an x-ray of the thoracic spine and left shoulder will be obtained today. Blood work, including inflammatory markers and ANA will be ordered today. We did discuss stretches and lifestyle changes to help with fibromyalgia pain. Zanaflex to use as needed sent to pharmacy. Follow up in 3 months.   - Sed Rate (ESR) - C-reactive protein - Antinuclear Antib (ANA) - tizanidine (ZANAFLEX) 2 MG capsule; Take 1 capsule (2 mg total) by mouth 3 (three) times daily.  Dispense: 30 capsule; Refill: 0  2. Nausea: Zofran refilled today.   - ondansetron (ZOFRAN ODT) 4 MG disintegrating tablet; Take 1 tablet (4 mg total) by mouth every 6 (six) hours as needed for nausea or  vomiting.  Dispense: 20 tablet; Refill: 0  3. Vitamin D deficiency: History of Vitamin D deficiency, currently taking 1500 IU daily. Recheck with above labs today.   - Vitamin D (25 hydroxy)    Teodora Medici, DO

## 2021-10-28 NOTE — Patient Instructions (Addendum)
It was great seeing you today!  Plan discussed at today's visit: -Blood work ordered today, results will be uploaded to MyChart. Based on results we might have you see Rheumatologist.  -X-ray of back ordered today to rule out other pathology -New muscle relaxer sent to pharmacy to take before bed -Try to focus on stretches/yoga every day if possible   Follow up in: 3 months   Take care and let us know if you have any questions or concerns prior to your next visit.  Dr. Caralee Ates

## 2021-10-28 NOTE — Telephone Encounter (Signed)
Copied from CRM [id number]. Topic: General - Other >> Oct 28, 2021 12:44 PM Herby Abraham C wrote: Reason for CRM: Tiffany with imaging is calling in for assistance. They have received the order that was placed by provider Caralee Ates, however it must be signed in order to complete. Tiffany says that they are okay to take a verbal as well.    Please call [phone] as soon as possible to assist further as pt is currently waiting in office to complete.

## 2021-10-31 LAB — C-REACTIVE PROTEIN: CRP: 0.5 mg/L (ref <8.0)

## 2021-10-31 LAB — ANTI-NUCLEAR AB-TITER (ANA TITER): ANA Titer 1: 1:80 {titer} — ABNORMAL HIGH

## 2021-10-31 LAB — SEDIMENTATION RATE: Sed Rate: 9 mm/h (ref 0–20)

## 2021-10-31 LAB — ANA: Anti Nuclear Antibody (ANA): POSITIVE — AB

## 2021-10-31 LAB — VITAMIN D 25 HYDROXY (VIT D DEFICIENCY, FRACTURES): Vit D, 25-Hydroxy: 25 ng/mL — ABNORMAL LOW (ref 30–100)

## 2021-11-02 DIAGNOSIS — Z23 Encounter for immunization: Secondary | ICD-10-CM | POA: Diagnosis not present

## 2021-11-02 NOTE — Addendum Note (Signed)
Addended by: Margarita Mail on: 11/02/2021 07:57 AM   Modules accepted: Orders

## 2021-11-10 DIAGNOSIS — M791 Myalgia, unspecified site: Secondary | ICD-10-CM | POA: Diagnosis not present

## 2021-11-10 DIAGNOSIS — G43719 Chronic migraine without aura, intractable, without status migrainosus: Secondary | ICD-10-CM | POA: Diagnosis not present

## 2021-11-10 DIAGNOSIS — G518 Other disorders of facial nerve: Secondary | ICD-10-CM | POA: Diagnosis not present

## 2021-11-10 DIAGNOSIS — G43111 Migraine with aura, intractable, with status migrainosus: Secondary | ICD-10-CM | POA: Diagnosis not present

## 2021-11-10 DIAGNOSIS — G43109 Migraine with aura, not intractable, without status migrainosus: Secondary | ICD-10-CM | POA: Diagnosis not present

## 2021-11-10 DIAGNOSIS — M542 Cervicalgia: Secondary | ICD-10-CM | POA: Diagnosis not present

## 2021-11-16 ENCOUNTER — Other Ambulatory Visit: Payer: Self-pay

## 2021-11-16 ENCOUNTER — Ambulatory Visit (INDEPENDENT_AMBULATORY_CARE_PROVIDER_SITE_OTHER): Payer: Medicare Other | Admitting: Licensed Clinical Social Worker

## 2021-11-16 DIAGNOSIS — F4010 Social phobia, unspecified: Secondary | ICD-10-CM | POA: Diagnosis not present

## 2021-11-16 DIAGNOSIS — F331 Major depressive disorder, recurrent, moderate: Secondary | ICD-10-CM

## 2021-11-16 NOTE — Progress Notes (Signed)
**Yesenia [name] De-Identified via Obfuscation** Virtual Visit via Audio Yesenia [name]  I connected with Yesenia Yesenia [name] on 11/16/21 at 11:00 AM EST by an audio enabled telemedicine application and verified that I am speaking with the correct person using two identifiers.Sign language interpreter was on audio line for assistance.   Location: Patient: home Provider: remote office [place], [place])   I discussed the limitations of evaluation and management by telemedicine and the availability of in person appointments. The patient expressed understanding and agreed to proceed.   I discussed the assessment and treatment plan with the patient. The patient was provided an opportunity to ask questions and all were answered. The patient agreed with the plan and demonstrated an understanding of the instructions.   The patient was advised to call back or seek an in-person evaluation if the symptoms worsen or if the condition fails to improve as anticipated.  I provided 39 minutes of non-face-to-face time during this encounter.   Yesenia Yesenia [name] R Yesenia Yesenia [name], Yesenia Yesenia [name]   Yesenia Yesenia [name]  Session Time: [phone]  Participation Level: Active  Behavioral Response: NAAlertAnxious  Type of Therapy: Individual Therapy  Treatment Goals addressed:  Problem: Decrease depressive symptoms and improve levels of effective functioning  Goal: LTG: Reduce frequency, intensity, and duration of depression symptoms as evidenced by: pt self report Outcome: Progressing Yesenia [name]: Pt reporting only isolated incidents of depression and pulls self out of episodes quickly  Goal: STG: Yesenia Yesenia [name] WILL PARTICIPATE IN AT LEAST 80% OF SCHEDULED INDIVIDUAL PSYCHOTHERAPY SESSIONS Outcome: Progressing  Problem: Reduce overall frequency, intensity, and duration of the anxiety so that daily functioning is not impaired. Goal: LTG: Patient will score less than 5 on the Generalized Anxiety Disorder 7 Scale (GAD-7) Outcome: Progressing Yesenia [name]: Pt has some social anxiety continuing but reports  progress  Goal: STG: Patient will participate in at least 80% of scheduled individual psychotherapy sessions Outcome: Progressing Interventions:  Intervention: Assess emotional status and coping mechanisms  Intervention: Work with patient to identify the major components of a recent episode of anxiety: physical symptoms, major thoughts and images, and major behaviors they experienced  Intervention: Assess effectiveness of pain management  Summary: Yesenia Yesenia [name] is a 35 y.o. female who presents with stable symptoms related to depression and social anxiety diagnoses. Patient reports that she's not really feeling depressed, and mood has been stable recently.  Patient feels that she is managing stressful situations well. Pt reports that her sleep cycle has improved--pt now going to bed 9pm and sleeping until 5-6am. Pt reports that she is very happy about this.  Allowed pt to explore and express thoughts and feelings associated with recent life situations and external stressors. Pt reports that she is having some additional testing completed by rheumatologist to assess continuing body/joint pain.   Pt reports that she is still not getting out of the house much. Discussed communication barriers and anxiety triggers that pt often faces when out in public. Discussed "assumptions" that other people make about pt (from her perspective) versus reality. Discussed ways pt can set boundaries with others politely so that she doesn't feel the need to explain her hearing impairment to anyone and everyone she encounters when shopping/out in public.   Reviewed coping skills to help manage depression and anxiety symptoms  Continued recommendations are as follows: self care behaviors, positive social engagements, focusing on overall work/home/life balance, and focusing on positive physical and emotional wellness.   Suicidal/Homicidal: No  Yesenia Response: Patient is continuing to work hard to reduce overall  level, frequency, and intensity of the  anxiety so that daily functioning is not impaired. Patient is able to identify coping mechanisms that have been successful in the past and increase their use. Patient is able to identify current and past experiences with specific fears, prominent worries, and anxiety symptoms including their impact on functioning and recent attempts to resolve it. Patient understands and can verbalize and understanding of the role that fearful thinking plays in creating fears, excessive worry, and persistent anxiety symptoms. Patient is continuing to implement a regular exercise regimen as a depression reduction technique. These behaviors are reflective of personal growth and progress. treatment to continue as indicated.  Plan: Return again in 4 weeks.  Diagnosis: Axis I: MDD, recurrent, moderate; social anxiety disorder    Axis II: No diagnosis    Yesenia Yesenia [name], Yesenia Yesenia [name] 11/16/2021

## 2021-11-17 NOTE — Plan of Care (Signed)
°  Problem: Decrease depressive symptoms and improve levels of effective functioning Goal: LTG: Reduce frequency, intensity, and duration of depression symptoms as evidenced by: pt self report Outcome: Progressing Note: Pt reporting only isolated incidents of depression and pulls self out of episodes quickly Goal: STG: Yesenia Wood WILL PARTICIPATE IN AT LEAST 80% OF SCHEDULED INDIVIDUAL PSYCHOTHERAPY SESSIONS Outcome: Progressing Intervention: Assess emotional status and coping mechanisms Intervention: Work with patient to identify the major components of a recent episode of anxiety: physical symptoms, major thoughts and images, and major behaviors they experienced Intervention: Assess effectiveness of pain management   Problem: Reduce overall frequency, intensity, and duration of the anxiety so that daily functioning is not impaired. Goal: LTG: Patient will score less than 5 on the Generalized Anxiety Disorder 7 Scale (GAD-7) Outcome: Progressing Note: Pt has some social anxiety continuing but reports progress Goal: STG: Patient will participate in at least 80% of scheduled individual psychotherapy sessions Outcome: Progressing

## 2021-11-27 ENCOUNTER — Other Ambulatory Visit: Payer: Self-pay | Admitting: Family Medicine

## 2021-11-27 DIAGNOSIS — E785 Hyperlipidemia, unspecified: Secondary | ICD-10-CM

## 2021-11-27 NOTE — Telephone Encounter (Signed)
Requested medication (s) are due for refill today: yes  Requested medication (s) are on the active medication list: yes  Last refill:  05/21/21 #90 1 RF  Future visit scheduled: no  Notes to clinic:  overdue lab work, overdue OV   Requested Prescriptions  Pending Prescriptions Disp Refills   rosuvastatin (CRESTOR) 20 MG tablet [Pharmacy Med Name: ROSUVASTATIN CALCIUM 20 MG TAB] 90 tablet 1    Sig: TAKE 1 TABLET BY MOUTH EVERY DAY     Cardiovascular:  Antilipid - Statins Failed - 11/27/2021  9:06 AM      Failed - Total Cholesterol in normal range and within 360 days    Cholesterol, Total  Date Value Ref Range Status  10/29/2015 192 100 - 199 mg/dL Final   Cholesterol  Date Value Ref Range Status  10/09/2020 85 <200 mg/dL Final          Failed - LDL in normal range and within 360 days    LDL Cholesterol (Calc)  Date Value Ref Range Status  10/09/2020 39 mg/dL (calc) Final    Comment:    Reference range: <100 . Desirable range <100 mg/dL for primary prevention;   <70 mg/dL for patients with CHD or diabetic patients  with > or = 2 CHD risk factors. Marland Kitchen LDL-C is now calculated using the Martin-Hopkins  calculation, which is a validated novel method providing  better accuracy than the Friedewald equation in the  estimation of LDL-C.  Horald Pollen et al. Lenox Ahr. 0960;454(09): 2061-2068  (http://education.[facility].com/faq/FAQ164)           Failed - HDL in normal range and within 360 days    HDL  Date Value Ref Range Status  10/09/2020 27 (L) > OR = 50 mg/dL Final  81/19/1478 43 >29 mg/dL Final          Failed - Triglycerides in normal range and within 360 days    Triglycerides  Date Value Ref Range Status  10/09/2020 107 <150 mg/dL Final          Passed - Patient is not pregnant      Passed - Valid encounter within last 12 months    Recent Outpatient Visits           1 month ago Fibromyalgia   [facility] Margarita Mail, DO   4  months ago Fatty infiltration of liver   [facility] [place] Dense M, DO   10 months ago Sore throat   [facility] [place] Dense M, DO   1 year ago Mild persistent asthma without complication   [facility] Alba Cory, MD   1 year ago Dyslipidemia   [facility] Alba Cory, MD       Future Appointments             In 2 months [facility]

## 2021-11-30 ENCOUNTER — Other Ambulatory Visit: Payer: Self-pay

## 2021-12-19 NOTE — Progress Notes (Signed)
Office Visit Note  Patient: Yesenia Wood             Date of Birth: 1986-02-04           MRN: [id number]             PCP: Steele Sizer, MD Referring: Steele Sizer, MD Visit Date: 12/20/2021  Subjective:   History of Present Illness: Yesenia Wood is a 36 y.o. female here for evaluation of fibromyalgia syndrome and positive ANA. She has a history of chronic joint pains previously diagnosed as fibromyalgia by a rheumatologist in [place].  Pains been ongoing for at least about 5 years involving shoulders backs and hips bilaterally.  A few treatments have been tried with NSAIDs not very beneficial.  She was on muscle relaxants for time with improvement but worsen chronic migraine symptoms and had to stop this medication.  She is felt great when on steroids for other indications periodically.  Current problem with increase in pain espeially shoulder and upper back possibly worsened after a fall about 5 months ago. Lab testing showing positive ANA. Xrays of the left shoulder and thoracic spine were unremarkable. Some of the joint pain goes back to when she was a teenager.  She experienced periods of increased joint pain or also associated rashes and low-grade fevers.  She was evaluated for possible juvenile inflammatory arthritis but nothing specifically identified.  She still experiencing some episodic rashes mostly involving her bilateral arms.  She describes raised red spots that are itchy will typically resolve leaving behind small bumps she describes looking somewhat like zits which then also resolve.  These areas are chronically itchy she uses moisturizing treatment and some of this has been attributed to eczema she wears sleeves all the time to avoid scratching her arms. She has migraines going back to highschool age.  These were initially treated conservatively with Tylenol and without complications.  However these got worse beocming more chronic she sees neurology with medication and  injections with partial improvements. She also describes concentration and memory difficulty that bothers her much of the time.  She does not usually notice much swelling.  She has noticed occasional blue discoloration of her toes with a feeling of inappropriately hot even when they are cold. She also reports a history of bells palsy in the past with no relapsing occurrences.  Patient's mother was present at this encounter providing some collateral history especially with pediatric medical history, encounter performed with assistance of in person sign language interpreter.  Labs reviewed 10/2021 ANA 1:80 DFS ESR 2 CRP 0.5 Vit D 25  Review of Systems  Constitutional:  Positive for fatigue.  HENT:  Positive for hearing loss. Negative for mouth sores.   Gastrointestinal:  Negative for diarrhea.  Endocrine: Positive for heat intolerance.  Musculoskeletal:  Positive for joint pain, joint pain and morning stiffness. Negative for gait problem.  Skin:  Positive for rash and nodules/bumps.  Neurological:  Positive for headaches. Negative for weakness.  Hematological:  Negative for swollen glands.   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   Sleep disturbance 05/10/2016   Chronic migraine without aura 03/29/2016   Pain in the chest 06/09/2015   Palpitations 06/09/2015   Deaf 05/16/2015   B12 deficiency 05/16/2015    Cochlear implant status 05/16/2015   Fatty infiltration of liver 05/16/2015   Gastro-esophageal reflux disease without esophagitis 05/16/2015   Generalized headache 05/16/2015   Right-sided Bell's palsy 05/16/2015   H/O: depression 05/16/2015   Hypertriglyceridemia 05/16/2015   Irregular menstrual cycle 05/16/2015   Overweight 05/16/2015   Paresthesia 05/16/2015   Disorder of labyrinth 05/16/2015   Dyspnea 10/07/2014   Moderate persistent intrinsic asthma without status asthmaticus without complication 49/44/9675   Migraine 08/05/2013   Allergic rhinitis 07/01/2013   Obesity (BMI 30-39.9) 07/01/2013   Bartholin gland cyst 09/16/2008    Past Medical History:  Diagnosis Date   ADHD (attention deficit hyperactivity disorder)    B12 deficiency    Bell's palsy    right sided   Chest pain    a. 05/2015 ETT: elev HR/BP, but no acute ST/T changes; b. 09/2019 Cor CTA: Nl cors, Ca2+ = 0.   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   Fatigue    Fatty liver    Fibromyalgia    GERD (gastroesophageal reflux disease)    Headache    High triglycerides    Hyperglycemia    Hypertension    Hyperthyroidism    Moderate asthma    Obesity    Paresthesia    Premature birth     Family History  Adopted: Yes  Family history unknown: Yes   Past Surgical History:  Procedure Laterality Date   BARTHOLIN GLAND CYST REMOVAL     COCHLEAR IMPLANT     COCHLEAR IMPLANT REMOVAL     DILATATION AND CURETTAGE/HYSTEROSCOPY WITH MINERVA N/A 03/05/2021   Procedure: DILATATION AND CURETTAGE/HYSTEROSCOPY WITH MINERVA;  Surgeon: Rubie Maid, MD;  Location: [facility] OR[facility];  Service: Gynecology;  Laterality: N/A;   LAPAROSCOPIC TUBAL LIGATION Bilateral 03/26/2018   Procedure: LAPAROSCOPIC TUBAL LIGATION;  Surgeon: Rubie Maid, MD;  Location: [facility];  Service: Gynecology;  Laterality: Bilateral;  with peritoneal biopsies    LAPAROSCOPY     MYRINGOTOMY WITH TUBE PLACEMENT     TUBAL LIGATION  03/26/2018   Social History   Social History Narrative   Lives with her adopted  parents   Hearing loss since birth    Immunization History  Administered Date(s) Administered   Influenza,inj,Quad PF,6+ Mos 07/20/2015, 09/01/2017, 09/25/2018, 08/09/2019, 09/08/2020, 07/26/2021   Influenza-Unspecified 09/12/2014, 08/24/2016   PFIZER Comirnaty(Gray Top)Covid-19 Tri-Sucrose Vaccine 03/03/2020, 03/24/2020, 05/21/2021   PFIZER(Purple Top)SARS-COV-2 Vaccination 03/03/2020, 03/24/2020, 10/30/2020   PNEUMOCOCCAL CONJUGATE-20 07/26/2021   Pneumococcal Conjugate-13 11/24/2014   Pneumococcal Polysaccharide-23 11/24/2014   Tdap 11/28/2009, 03/16/2016     Objective: Vital Signs: BP 112/76 (BP Location: Left Arm)    Pulse 84    Ht 5' 3" (1.6 m)    Wt 176 lb (79.8 kg)    BMI 31.18 kg/m    Physical Exam HENT:     Mouth/Throat:     Mouth: Mucous membranes are moist.     Pharynx: Oropharynx is clear.  Cardiovascular:     Rate and Rhythm: Normal rate and regular rhythm.  Pulmonary:     Effort: Pulmonary effort is  normal.     Breath sounds: Normal breath sounds.  Musculoskeletal:     Right lower leg: No edema.     Left lower leg: No edema.  Skin:    General: Skin is warm and dry.     Comments: Central facial erythematous, blanching rash with few papules  Neurological:     Mental Status: She is alert.     Motor: No weakness.     Deep Tendon Reflexes: Reflexes normal.  Psychiatric:        Mood and Affect: Mood normal.     Musculoskeletal Exam:  Neck and shoulder range of motion is normal bilaterally, tenderness to pressure over the trapezius muscles both sides, worse tenderness is along medial border of scapula a no palpable knots or nodules there is some radiation of pain up and down with pressure, tenderness to paraspinal muscles over the lumbar spine, lateral hip and low back pain exacerbated with internal rotation  bilaterally, no appreciable joint swelling or limited ROM  in extremities   Investigation: No additional findings.  Imaging: No results found.  Recent Labs: Lab Results  Component Value Date   WBC 8.0 07/18/2021   HGB 13.8 07/18/2021   PLT 304 07/18/2021   NA 138 07/26/2021   K 4.2 07/26/2021   CL 102 07/26/2021   CO2 28 07/26/2021   GLUCOSE 76 07/26/2021   BUN 10 07/26/2021   CREATININE 0.72 07/26/2021   BILITOT 0.5 07/26/2021   ALKPHOS 64 07/17/2021   AST 27 07/26/2021   ALT 39 (H) 07/26/2021   PROT 7.0 07/26/2021   ALBUMIN 4.3 07/17/2021   CALCIUM 9.4 07/26/2021   GFRAA 105 10/09/2020    Speciality Comments: No specialty comments available.  Procedures:  No procedures performed Allergies: Budesonide-formoterol fumarate, Astelin [azelastine], Gabapentin, Nortriptyline, Lamictal [lamotrigine], Milnacipran, Tegaderm ag mesh [silver], Diclofenac, Duloxetine hcl, Montelukast sodium, Mupirocin, Naproxen, Naproxen sodium, Other, Oxycodone hcl, and Sulfa antibiotics   Assessment / Plan:     Visit Diagnoses: Positive ANA (antinuclear antibody)  Multiple symptoms but no specific clinical criteria or inflammatory changes present on exam today with the positive ANA.  Due to unclear clinical picture history of negative serology results but now with a positive ANA will send for AV[facility] CTD panel. We will follow up to review findings and discuss plan whether positive findings or else ongoing management options with her more functional pain.  Fibromyalgia  A lot of her symptoms remain consistent with possibly coming from a primary fibromyalgia syndrome.  There is definitely myofascial type pain involving muscles in the back and probably in the hips.  He may benefit from trial of additional neuropathic pain agents, online self-management resources, or some additional targeted physical therapy work.  Orders: No orders of the defined types were placed in this encounter.  No orders of  the defined types were placed in this encounter.   Follow-Up Instructions: Return in about 3 weeks (around 01/10/2022) for New pt AV[facility] CTD f/u.   Collier Salina, MD  Note - This record has been created using [employer].  Chart creation errors have been sought, but may not always  have been located. Such creation errors do not reflect on  the standard of medical care.

## 2021-12-20 ENCOUNTER — Ambulatory Visit (INDEPENDENT_AMBULATORY_CARE_PROVIDER_SITE_OTHER): Payer: Medicare Other | Admitting: Internal Medicine

## 2021-12-20 ENCOUNTER — Other Ambulatory Visit: Payer: Self-pay

## 2021-12-20 ENCOUNTER — Encounter: Payer: Self-pay | Admitting: Internal Medicine

## 2021-12-20 DIAGNOSIS — R768 Other specified abnormal immunological findings in serum: Secondary | ICD-10-CM | POA: Diagnosis not present

## 2021-12-20 DIAGNOSIS — M797 Fibromyalgia: Secondary | ICD-10-CM | POA: Insufficient documentation

## 2021-12-20 DIAGNOSIS — D8989 Other specified disorders involving the immune mechanism, not elsewhere classified: Secondary | ICD-10-CM | POA: Diagnosis not present

## 2021-12-27 DIAGNOSIS — G43111 Migraine with aura, intractable, with status migrainosus: Secondary | ICD-10-CM | POA: Diagnosis not present

## 2021-12-27 DIAGNOSIS — G43719 Chronic migraine without aura, intractable, without status migrainosus: Secondary | ICD-10-CM | POA: Diagnosis not present

## 2021-12-27 DIAGNOSIS — M542 Cervicalgia: Secondary | ICD-10-CM | POA: Diagnosis not present

## 2021-12-27 DIAGNOSIS — G43109 Migraine with aura, not intractable, without status migrainosus: Secondary | ICD-10-CM | POA: Diagnosis not present

## 2021-12-27 DIAGNOSIS — G518 Other disorders of facial nerve: Secondary | ICD-10-CM | POA: Diagnosis not present

## 2021-12-27 DIAGNOSIS — M791 Myalgia, unspecified site: Secondary | ICD-10-CM | POA: Diagnosis not present

## 2022-01-06 ENCOUNTER — Ambulatory Visit (INDEPENDENT_AMBULATORY_CARE_PROVIDER_SITE_OTHER): Payer: Medicare Other | Admitting: Licensed Clinical Social Worker

## 2022-01-06 ENCOUNTER — Other Ambulatory Visit: Payer: Self-pay

## 2022-01-06 DIAGNOSIS — F4010 Social phobia, unspecified: Secondary | ICD-10-CM | POA: Diagnosis not present

## 2022-01-06 DIAGNOSIS — F331 Major depressive disorder, recurrent, moderate: Secondary | ICD-10-CM | POA: Diagnosis not present
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
## 2022-01-06 NOTE — Plan of Care (Signed)
°  Problem: Decrease depressive symptoms and improve levels of effective functioning Goal: LTG: Reduce frequency, intensity, and duration of depression symptoms as evidenced by: pt self report Outcome: Progressing Goal: STG: Fannie WILL PARTICIPATE IN AT LEAST 80% OF SCHEDULED INDIVIDUAL PSYCHOTHERAPY SESSIONS Outcome: Progressing Intervention: REVIEW PLEASE SKILLS (TREAT PHYSICAL ILLNESS, BALANCE EATING, AVOID MOOD-ALTERING SUBSTANCES, BALANCE SLEEP AND GET EXERCISE) WITH Jacki Cones Intervention: Encourage family support Intervention: Encourage verbalization of feelings/concerns/expectations   Problem: Reduce overall frequency, intensity, and duration of the anxiety so that daily functioning is not impaired. Goal: LTG: Patient will score less than 5 on the Generalized Anxiety Disorder 7 Scale (GAD-7) Outcome: Progressing Goal: STG: Patient will participate in at least 80% of scheduled individual psychotherapy sessions Outcome: Progressing Intervention: Work with patient to identify the major components of a recent episode of anxiety: physical symptoms, major thoughts and images, and major behaviors they experienced Intervention: Assist with coping skills and behavior Intervention: Assist with relaxation techniques, as appropriate (deep breathing exercises, meditation, guided imagery)

## 2022-01-12 NOTE — Progress Notes (Incomplete)
Office Visit Note  Patient: Yesenia Wood             Date of Birth: [birth year]           MRN: [id number]             PCP: Steele Sizer, MD Referring: Steele Sizer, MD Visit Date: 01/13/2022   Subjective:  Follow-up (Fair)   History of Present Illness: Yesenia Wood is a 36 y.o. female here for follow up for positive ANA and fibromyalgia syndrome with chronic pain. [facility] CTD labs were checked to further evaluate abnormal ANA and without any findings indicating autoimmune problem.***   Previous HPI 12/20/21 [name] is a 36 y.o. female here for evaluation of fibromyalgia syndrome and positive ANA. She has a history of chronic joint pains previously diagnosed as fibromyalgia by a rheumatologist in [place].  Pains been ongoing for at least about 5 years involving shoulders backs and hips bilaterally.  A few treatments have been tried with NSAIDs not very beneficial.  She was on muscle relaxants for time with improvement but worsen chronic migraine symptoms and had to stop this medication.  She is felt great when on steroids for other indications periodically.  Current problem with increase in pain espeially shoulder and upper back possibly worsened after a fall about 5 months ago. Lab testing showing positive ANA. Xrays of the left shoulder and thoracic spine were unremarkable. Some of the joint pain goes back to when she was a teenager.  She experienced periods of increased joint pain or also associated rashes and low-grade fevers.  She was evaluated for possible juvenile inflammatory arthritis but nothing specifically identified.  She still experiencing some episodic rashes mostly involving her bilateral arms.  She describes raised red spots that are itchy will typically resolve leaving behind small bumps she describes looking somewhat like zits which then also resolve.  These areas are chronically itchy she uses moisturizing treatment and some of this has been attributed to  eczema she wears sleeves all the time to avoid scratching her arms. She has migraines going back to highschool age.  These were initially treated conservatively with Tylenol and without complications.  However these got worse beocming more chronic she sees neurology with medication and injections with partial improvements. She also describes concentration and memory difficulty that bothers her much of the time.  She does not usually notice much swelling.  She has noticed occasional blue discoloration of her toes with a feeling of inappropriately hot even when they are cold. She also reports a history of bells palsy in the past with no relapsing occurrences.   Patient's mother was present at this encounter providing some collateral history especially with pediatric medical history, encounter performed with assistance of in person sign language interpreter.  Review of Systems  Constitutional:  Positive for fatigue.  HENT:  Positive for mouth dryness.   Eyes:  Positive for dryness.  Respiratory:  Positive for shortness of breath.   Cardiovascular:  Positive for swelling in legs/feet.  Gastrointestinal:  Positive for constipation and diarrhea.  Endocrine: Positive for heat intolerance.  Genitourinary:  Negative for difficulty urinating.  Musculoskeletal:  Positive for joint pain, gait problem, joint pain, joint swelling, muscle weakness, morning stiffness and muscle tenderness.  Skin:  Positive for rash.  Allergic/Immunologic: Negative for susceptible to infections.  Neurological:  Positive for numbness and weakness.  Hematological:  Negative for bruising/bleeding tendency.  Psychiatric/Behavioral:  Positive for sleep disturbance.    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   Sleep disturbance 05/10/2016   Chronic migraine without aura 03/29/2016   Pain in the chest 06/09/2015   Palpitations 06/09/2015   Deaf 05/16/2015   B12 deficiency 05/16/2015   Cochlear implant status 05/16/2015   Fatty infiltration of liver 05/16/2015   Gastro-esophageal reflux disease without esophagitis 05/16/2015   Generalized headache 05/16/2015   Right-sided Bell's palsy 05/16/2015   H/O: depression 05/16/2015   Hypertriglyceridemia 05/16/2015   Irregular menstrual cycle 05/16/2015   Overweight 05/16/2015   Paresthesia 05/16/2015   Disorder of labyrinth 05/16/2015   Dyspnea 10/07/2014   Moderate persistent intrinsic asthma without status asthmaticus without complication [id number]   Migraine 08/05/2013   Allergic rhinitis 07/01/2013   Obesity (BMI 30-39.9) 07/01/2013   Bartholin gland cyst 09/16/2008    Past Medical History:  Diagnosis Date   ADHD (attention deficit hyperactivity disorder)    B12 deficiency    Bell's palsy    right sided   Chest pain    a. 05/2015 ETT: elev HR/BP, but no acute ST/T changes; b. 09/2019 Cor CTA: Nl cors, Ca2+ = 0.   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   Fatigue    Fatty liver    Fibromyalgia    GERD (gastroesophageal reflux disease)    Headache    High triglycerides    Hyperglycemia    Hypertension    Hyperthyroidism    Moderate asthma    Obesity    Paresthesia    Premature birth     Family History  Adopted: Yes   Family history unknown: Yes   Past Surgical History:  Procedure Laterality Date   BARTHOLIN GLAND CYST REMOVAL     COCHLEAR IMPLANT     COCHLEAR IMPLANT REMOVAL     DILATATION AND CURETTAGE/HYSTEROSCOPY WITH MINERVA N/A 03/05/2021   Procedure: DILATATION AND CURETTAGE/HYSTEROSCOPY WITH MINERVA;  Surgeon: Rubie Maid, MD;  Location: [facility] ORS;  Service: Gynecology;  Laterality: N/A;   LAPAROSCOPIC TUBAL LIGATION Bilateral 03/26/2018   Procedure: LAPAROSCOPIC TUBAL LIGATION;  Surgeon: Rubie Maid, MD;  Location: [facility] OR[facility];  Service: Gynecology;  Laterality: Bilateral;  with peritoneal biopsies   LAPAROSCOPY     MYRINGOTOMY WITH TUBE PLACEMENT     TUBAL LIGATION  03/26/2018   Social History   Social History Narrative   Lives with her adopted  parents   Hearing loss since birth    Immunization History  Administered Date(s) Administered   Influenza,inj,Quad PF,6+ Mos 07/20/2015, 09/01/2017, 09/25/2018, 08/09/2019, 09/08/2020, 07/26/2021   Influenza-Unspecified 09/12/2014, 08/24/2016   PFIZER Comirnaty(Gray Top)Covid-19 Tri-Sucrose Vaccine 03/03/2020, 03/24/2020, 05/21/2021   PFIZER(Purple Top)SARS-COV-2 Vaccination 03/03/2020, 03/24/2020, 10/30/2020   PNEUMOCOCCAL CONJUGATE-20 07/26/2021   Pneumococcal Conjugate-13 11/24/2014   Pneumococcal Polysaccharide-23 11/24/2014   Tdap 11/28/2009, 03/16/2016     Objective: Vital Signs: BP 108/74 (BP Location: Left Arm, Patient  Position: Sitting, Cuff Size: Normal)    Pulse 81    Resp 15    Ht 5\' 3"  (1.6 m)    Wt 176 lb (79.8 kg)    BMI 31.18 kg/m    Physical Exam   Musculoskeletal Exam: ***  CDAI Exam: CDAI Score: -- Patient Global: --; Provider Global: -- Swollen: --; Tender: -- Joint Exam 01/13/2022   No joint exam has been documented for this visit   There is currently no information documented on the homunculus. Go to the Rheumatology activity and complete the homunculus joint exam.  Investigation: No  additional findings.  Imaging: No results found.  Recent Labs: Lab Results  Component Value Date   WBC 8.0 07/18/2021   HGB 13.8 07/18/2021   PLT 304 07/18/2021   NA 138 07/26/2021   K 4.2 07/26/2021   CL 102 07/26/2021   CO2 28 07/26/2021   GLUCOSE 76 07/26/2021   BUN 10 07/26/2021   CREATININE 0.72 07/26/2021   BILITOT 0.5 07/26/2021   ALKPHOS 64 07/17/2021   AST 27 07/26/2021   ALT 39 (H) 07/26/2021   PROT 7.0 07/26/2021   ALBUMIN 4.3 07/17/2021   CALCIUM 9.4 07/26/2021   GFRAA 105 10/09/2020    Speciality Comments: No specialty comments available.  Procedures:  No procedures performed Allergies: Budesonide-formoterol fumarate, Astelin [azelastine], Gabapentin, Nortriptyline, Lamictal [lamotrigine], Milnacipran, Tegaderm ag mesh [silver], Diclofenac, Duloxetine hcl, Montelukast sodium, Mupirocin, Naproxen, Naproxen sodium, Other, Oxycodone hcl, and Sulfa antibiotics   Assessment / Plan:     Visit Diagnoses: Positive ANA (antinuclear antibody)  Fibromyalgia  Disorder of labyrinth, unspecified laterality  Migraine without status migrainosus, not intractable, unspecified migraine type  ***  Orders: No orders of the defined types were placed in this encounter.  No orders of the defined types were placed in this encounter.    Follow-Up Instructions: No follow-ups on file.   Collier Salina, MD  Note - This record has been created using [employer].  Chart creation errors have been sought, but may not always  have been located. Such creation errors do not reflect on  the standard of medical care.

## 2022-01-13 ENCOUNTER — Encounter: Payer: Self-pay | Admitting: Internal Medicine

## 2022-01-13 ENCOUNTER — Ambulatory Visit (INDEPENDENT_AMBULATORY_CARE_PROVIDER_SITE_OTHER): Payer: Medicare Other | Admitting: Internal Medicine

## 2022-01-13 ENCOUNTER — Other Ambulatory Visit: Payer: Self-pay

## 2022-01-13 VITALS — BP 108/74 | HR 81 | Resp 15 | Ht 63.0 in | Wt 176.0 lb

## 2022-01-13 DIAGNOSIS — R768 Other specified abnormal immunological findings in serum: Secondary | ICD-10-CM

## 2022-01-13 DIAGNOSIS — H8190 Unspecified disorder of vestibular function, unspecified ear: Secondary | ICD-10-CM | POA: Diagnosis not present

## 2022-01-13 DIAGNOSIS — G43909 Migraine, unspecified, not intractable, without status migrainosus: Secondary | ICD-10-CM

## 2022-01-13 DIAGNOSIS — M797 Fibromyalgia: Secondary | ICD-10-CM

## 2022-01-17 ENCOUNTER — Ambulatory Visit: Payer: Medicare Other | Admitting: Internal Medicine

## 2022-01-31 ENCOUNTER — Telehealth: Payer: Self-pay | Admitting: Family Medicine

## 2022-01-31 NOTE — Telephone Encounter (Signed)
Pt is calling to check on referral to PT in the [place] area. ?Please advise [phone] ?

## 2022-02-01 ENCOUNTER — Ambulatory Visit: Payer: Medicare Other

## 2022-02-01 NOTE — Telephone Encounter (Signed)
Lvm to inform pt

## 2022-02-03 DIAGNOSIS — H11121 Conjunctival concretions, right eye: Secondary | ICD-10-CM | POA: Diagnosis not present

## 2022-02-09 DIAGNOSIS — G43719 Chronic migraine without aura, intractable, without status migrainosus: Secondary | ICD-10-CM | POA: Diagnosis not present

## 2022-02-09 DIAGNOSIS — G518 Other disorders of facial nerve: Secondary | ICD-10-CM | POA: Diagnosis not present

## 2022-02-09 DIAGNOSIS — M791 Myalgia, unspecified site: Secondary | ICD-10-CM | POA: Diagnosis not present

## 2022-02-09 DIAGNOSIS — G43109 Migraine with aura, not intractable, without status migrainosus: Secondary | ICD-10-CM | POA: Diagnosis not present

## 2022-02-09 DIAGNOSIS — M542 Cervicalgia: Secondary | ICD-10-CM | POA: Diagnosis not present

## 2022-02-09 DIAGNOSIS — G43111 Migraine with aura, intractable, with status migrainosus: Secondary | ICD-10-CM | POA: Diagnosis not present

## 2022-02-10 ENCOUNTER — Ambulatory Visit (INDEPENDENT_AMBULATORY_CARE_PROVIDER_SITE_OTHER): Payer: Medicare Other | Admitting: Internal Medicine

## 2022-02-10 ENCOUNTER — Encounter: Payer: Self-pay | Admitting: Internal Medicine

## 2022-02-10 ENCOUNTER — Ambulatory Visit (INDEPENDENT_AMBULATORY_CARE_PROVIDER_SITE_OTHER): Payer: Medicare Other

## 2022-02-10 VITALS — BP 102/62 | HR 81 | Temp 98.3°F | Resp 16 | Ht 63.0 in | Wt 169.6 lb

## 2022-02-10 DIAGNOSIS — D509 Iron deficiency anemia, unspecified: Secondary | ICD-10-CM | POA: Insufficient documentation

## 2022-02-10 DIAGNOSIS — M25551 Pain in right hip: Secondary | ICD-10-CM | POA: Diagnosis not present

## 2022-02-10 DIAGNOSIS — Z0000 Encounter for general adult medical examination without abnormal findings: Secondary | ICD-10-CM | POA: Diagnosis not present

## 2022-02-10 DIAGNOSIS — M797 Fibromyalgia: Secondary | ICD-10-CM | POA: Diagnosis not present

## 2022-02-10 DIAGNOSIS — E559 Vitamin D deficiency, unspecified: Secondary | ICD-10-CM | POA: Diagnosis not present

## 2022-02-10 DIAGNOSIS — E538 Deficiency of other specified B group vitamins: Secondary | ICD-10-CM | POA: Diagnosis not present

## 2022-02-10 DIAGNOSIS — E785 Hyperlipidemia, unspecified: Secondary | ICD-10-CM | POA: Diagnosis not present

## 2022-02-10 DIAGNOSIS — M62838 Other muscle spasm: Secondary | ICD-10-CM | POA: Diagnosis not present

## 2022-02-10 MED ORDER — TIZANIDINE HCL 2 MG PO CAPS: 2.0000 mg | ORAL_CAPSULE | Freq: Three times a day (TID) | ORAL | 0 refills | Status: DC

## 2022-02-10 MED ORDER — METHYLPREDNISOLONE 4 MG PO TBPK: ORAL_TABLET | ORAL | 0 refills | Status: DC

## 2022-02-10 NOTE — Progress Notes (Signed)
? ?Subjective:  ? Yesenia Wood is a 36 y.o. female who presents for Medicare Annual (Subsequent) preventive examination. ? ?Review of Systems    ? ?Cardiac Risk Factors include: dyslipidemia;obesity (BMI >30kg/m2) ? ?   ?Objective:  ?  ?Today's Vitals  ? 02/10/22 1050 02/10/22 1051  ?BP: 102/62   ?Pulse: 81   ?Resp: 16   ?Temp: 98.3 ?F (36.8 ?C)   ?TempSrc: Oral   ?SpO2: 99%   ?Weight: 169 lb 9.6 oz (76.9 kg)   ?Height: 5\' 3"  (1.6 m)   ?PainSc:  5   ? ?Body mass index is 30.04 kg/m?. ? ?Advanced Directives 02/10/2022 07/18/2021 07/17/2021 03/05/2021 02/25/2021 04/20/2020 03/26/2018  ?Does Patient Have a Medical Advance Directive? No No No No No No No  ?Would patient like information on creating a medical advance directive? Yes (MAU/Ambulatory/Procedural Areas - Information given) - - No - Patient declined No - Patient declined No - Patient declined -  ?Some encounter information is confidential and restricted. Go to Review Flowsheets activity to see all data.  ? ? ?Current Medications (verified) ?Outpatient Encounter Medications as of 02/10/2022  ?Medication Sig  ? acebutolol (SECTRAL) 200 MG capsule TAKE 1 CAPSULE BY MOUTH TWICE A DAY  ? acetaminophen (TYLENOL) 500 MG tablet Take 2 tablets (1,000 mg total) by mouth every 6 (six) hours as needed.  ? BOTOX 100 units SOLR injection every 3 (three) months.   ? calcium carbonate (TUMS - DOSED IN MG ELEMENTAL CALCIUM) 500 MG chewable tablet Chew 1 tablet by mouth daily as needed for indigestion or heartburn.  ? Cyanocobalamin (VITAMIN B-12) 500 MCG SUBL Place 1 tablet (500 mcg total) under the tongue once a week.  ? diphenhydrAMINE (BENADRYL) 50 MG tablet Take 50 mg by mouth at bedtime as needed for itching.  ? docusate sodium (COLACE) 100 MG capsule Take 100 mg by mouth daily as needed for mild constipation.  ? ferrous sulfate 325 (65 FE) MG tablet Take 325 mg by mouth daily with breakfast.  ? folic acid (FOLVITE) 800 MCG tablet Take 800 mcg by mouth daily. 800 mcg  ?  ondansetron (ZOFRAN ODT) 4 MG disintegrating tablet Take 1 tablet (4 mg total) by mouth every 6 (six) hours as needed for nausea or vomiting.  ? rosuvastatin (CRESTOR) 20 MG tablet TAKE 1 TABLET BY MOUTH EVERY DAY  ? simethicone (MYLICON) 125 MG chewable tablet Chew 125 mg by mouth every 6 (six) hours as needed for flatulence.  ? [DISCONTINUED] ibuprofen (ADVIL) 800 MG tablet Take 1 tablet (800 mg total) by mouth every 8 (eight) hours as needed for mild pain. (Patient not taking: Reported on 12/20/2021)  ? [DISCONTINUED] tizanidine (ZANAFLEX) 2 MG capsule Take 1 capsule (2 mg total) by mouth 3 (three) times daily. (Patient not taking: Reported on 12/20/2021)  ? ?No facility-administered encounter medications on file as of 02/10/2022.  ? ? ?Allergies (verified) ?Budesonide-formoterol fumarate, Astelin [azelastine], Gabapentin, Nortriptyline, Lamictal [lamotrigine], Milnacipran, Tegaderm ag mesh [silver], Diclofenac, Duloxetine hcl, Montelukast sodium, Mupirocin, Naproxen, Naproxen sodium, Other, Oxycodone hcl, and Sulfa antibiotics  ? ?History: ?Past Medical History:  ?Diagnosis Date  ? ADHD (attention deficit hyperactivity disorder)   ? B12 deficiency   ? Bell's palsy   ? right sided  ? Chest pain   ? a. 05/2015 ETT: elev HR/BP, but no acute ST/T changes; b. 09/2019 Cor CTA: Nl cors, Ca2+ = 0.  ? Chronic migraine   ? Deaf   ? Depression   ? Dissocial personality disorder (  HCC)   ? 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 Fatigue   ? Fatty liver   ? Fibromyalgia   ? GERD (gastroesophageal reflux disease)   ? Headache   ? High triglycerides   ? Hyperglycemia   ? Hypertension   ? Hyperthyroidism   ? Moderate asthma   ? Obesity   ? Paresthesia   ? Premature birth   ? ?Past Surgical History:  ?Procedure Laterality Date  ? BARTHOLIN GLAND CYST REMOVAL    ? COCHLEAR IMPLANT    ? COCHLEAR IMPLANT REMOVAL    ? DILATATION AND CURETTAGE/HYSTEROSCOPY WITH MINERVA N/A 03/05/2021  ? Procedure: DILATATION AND  CURETTAGE/HYSTEROSCOPY WITH MINERVA;  Surgeon: Hildred Laserherry, Anika, MD;  Location: [facility] OR[facility];  Service: Gynecology;  Laterality: N/A;  ? LAPAROSCOPIC TUBAL LIGATION Bilateral 03/26/2018  ? Procedure: LAPAROSCOPIC TUBAL LIGATION;  Surgeon: Hildred Laserherry, Anika, MD;  Location: [facility] OR[facility];  Service: Gynecology;  Laterality: Bilateral;  with peritoneal biopsies  ? LAPAROSCOPY    ? MYRINGOTOMY WITH TUBE PLACEMENT    ? TUBAL LIGATION  03/26/2018  ? ?Family History  ?Adopted: Yes  ?Family history unknown: Yes  ? ?Social History  ? ?Socioeconomic History  ? Marital status: Single  ?  Spouse name: Not on file  ? Number of children: 0  ? Years of education: Not on file  ? Highest education level: Some college, no degree  ?Occupational History  ? Occupation: disability for hearing loss  ?Tobacco Use  ? Smoking status: Former  ?  Packs/day: 1.00  ?  Years: 2.00  ?  Pack years: 2.00  ?  Types: Cigarettes  ?  Start date: 11/28/2004  ?  Quit date: 11/28/2006  ?  Years since quitting: 15.2  ? Smokeless tobacco: Never  ?Vaping Use  ? Vaping Use: Never used  ?Substance and Sexual Activity  ? Alcohol use: Yes  ?  Comment: 1 every 3 months  ? Drug use: No  ? Sexual activity: Not Currently  ?  Birth control/protection: Pill  ?  Comment: Seasonale  ?Other Topics Concern  ? Not on file  ?Social History Narrative  ? Lives with her adopted  parents  ? Hearing loss since birth   ? ?Social Determinants of Health  ? ?Financial Resource Strain: Low Risk   ? Difficulty of Paying Living Expenses: Not very hard  ?Food Insecurity: No Food Insecurity  ? Worried About Programme researcher, broadcasting/film/videounning Out of Food in the Last Year: Never true  ? Ran Out of Food in the Last Year: Never true  ?Transportation Needs: No Transportation Needs  ? Lack of Transportation (Medical): No  ? Lack of Transportation (Non-Medical): No  ?Physical Activity: Insufficiently Active  ? Days of Exercise per Week: 7 days  ? Minutes of Exercise per Session: 20 min  ?Stress: No Stress Concern Present  ? Feeling of Stress :  Only a little  ?Social Connections: Socially Isolated  ? Frequency of Communication with Friends and Family: More than three times a week  ? Frequency of Social Gatherings with Friends and Family: Never  ? Attends Religious Services: Never  ? Active Member of Clubs or Organizations: No  ? Attends BankerClub or Organization Meetings: Never  ? Marital Status: Never married  ? ? ?Tobacco Counseling ?Counseling given: Not Answered ? ? ?Clinical Intake: ? ?Pre-visit preparation completed: Yes ? ?Pain : 0-10 ?Pain Score: 5  ?Pain Type: Chronic pain ?Pain Location: Leg (right leg & left shoulder) ?Pain Orientation: Right ?Pain Descriptors / Indicators: Aching,  Sore ?Pain Onset: More than a month ago ?Pain Frequency: Constant ? ?  ? ?BMI - recorded: 30.04 ?Nutritional Status: BMI > 30  Obese ?Diabetes: No ? ?How often do you need to have someone help you when you read instructions, pamphlets, or other written materials from your doctor or pharmacy?: 1 - Never ? ? ? ?Interpreter Needed?: Yes ?Interpreter Agency: [facility] ?Interpreter Name: Carline ?Interpreter ID: [id number] ?Patient Declined Interpreter : No ?Patient signed [place] waiver: Yes ? ?Information entered by :: Reather Littler LPN ? ? ?Activities of Daily Living ?In your present state of health, do you have any difficulty performing the following activities: 02/10/2022 10/28/2021  ?Hearing? Y Y  ?Comment deaf -  ?Vision? Y Y  ?Difficulty concentrating or making decisions? Y Y  ?Walking or climbing stairs? Y Y  ?Comment when in pain -  ?Dressing or bathing? Y N  ?Comment sometimes -  ?Doing errands, shopping? N N  ?Preparing Food and eating ? N -  ?Using the Toilet? N -  ?In the past six months, have you accidently leaked urine? Y -  ?Comment wears pads for protection as needed -  ?Do you have problems with loss of bowel control? N -  ?Managing your Medications? N -  ?Managing your Finances? N -  ?Housekeeping or managing your Housekeeping? N -  ?Some recent data might be hidden   ? ? ?Patient Care Team: ?Alba Cory, MD as PCP - General (Family Medicine) ?Iran Ouch, MD as Consulting Physician (Cardiology) ?Hildred Laser, MD as Referring Physician (Obstetrics and Gynecology) ?[place], Sa

## 2022-02-10 NOTE — Patient Instructions (Signed)
Yesenia Wood , ?Thank you for taking time to come for your Medicare Wellness Visit. I appreciate your ongoing commitment to your health goals. Please review the following plan we discussed and let me know if I can assist you in the future.  ? ?Screening recommendations/referrals: ?Colonoscopy: due age 36 ?Mammogram: due age 70 ?Bone Density: due age 77 ?Recommended yearly ophthalmology/optometry visit for glaucoma screening and checkup ?Recommended yearly dental visit for hygiene and checkup ? ?Vaccinations: ?Influenza vaccine: done 07/26/21 ?Pneumococcal vaccine: done 07/26/21 ?Tdap vaccine: done 03/16/16 ?Shingles vaccine: due age 71  ?Covid-19: done 03/03/20, 03/24/20, 10/30/20, 05/21/21 & 11/02/21 ? ?Advanced directives: Advance directive discussed with you today. I have provided a copy for you to complete at home and have notarized. Once this is complete please bring a copy in to our office so we can scan it into your chart.  ? ?Conditions/risks identified: Keep up the great work! ? ?Next appointment: Follow up in one year for your annual wellness visit.  ? ?Preventive Care 67-64 Years, Female ?Preventive care refers to lifestyle choices and visits with your health care provider that can promote health and wellness. ?What does preventive care include? ?A yearly physical exam. This is also called an annual well check. ?Dental exams once or twice a year. ?Routine eye exams. Ask your health care provider how often you should have your eyes checked. ?Personal lifestyle choices, including: ?Daily care of your teeth and gums. ?Regular physical activity. ?Eating a healthy diet. ?Avoiding tobacco and drug use. ?Limiting alcohol use. ?Practicing safe sex. ?Taking low-dose aspirin daily starting at age 84. ?Taking vitamin and mineral supplements as recommended by your health care provider. ?What happens during an annual well check? ?The services and screenings done by your health care provider during your annual well check will  depend on your age, overall health, lifestyle risk factors, and family history of disease. ?Counseling  ?Your health care provider may ask you questions about your: ?Alcohol use. ?Tobacco use. ?Drug use. ?Emotional well-being. ?Home and relationship well-being. ?Sexual activity. ?Eating habits. ?Work and work Statistician. ?Method of birth control. ?Menstrual cycle. ?Pregnancy history. ?Screening  ?You may have the following tests or measurements: ?Height, weight, and BMI. ?Blood pressure. ?Lipid and cholesterol levels. These may be checked every 5 years, or more frequently if you are over 48 years old. ?Skin check. ?Lung cancer screening. You may have this screening every year starting at age 31 if you have a 30-pack-year history of smoking and currently smoke or have quit within the past 15 years. ?Fecal occult blood test (FOBT) of the stool. You may have this test every year starting at age 45. ?Flexible sigmoidoscopy or colonoscopy. You may have a sigmoidoscopy every 5 years or a colonoscopy every 10 years starting at age 44. ?Hepatitis C blood test. ?Hepatitis B blood test. ?Sexually transmitted disease (STD) testing. ?Diabetes screening. This is done by checking your blood sugar (glucose) after you have not eaten for a while (fasting). You may have this done every 1-3 years. ?Mammogram. This may be done every 1-2 years. Talk to your health care provider about when you should start having regular mammograms. This may depend on whether you have a family history of breast cancer. ?BRCA-related cancer screening. This may be done if you have a family history of breast, ovarian, tubal, or peritoneal cancers. ?Pelvic exam and Pap test. This may be done every 3 years starting at age 23. Starting at age 80, this may be done every 5 years if  you have a Pap test in combination with an HPV test. ?Bone density scan. This is done to screen for osteoporosis. You may have this scan if you are at high risk for  osteoporosis. ?Discuss your test results, treatment options, and if necessary, the need for more tests with your health care provider. ?Vaccines  ?Your health care provider may recommend certain vaccines, such as: ?Influenza vaccine. This is recommended every year. ?Tetanus, diphtheria, and acellular pertussis (Tdap, Td) vaccine. You may need a Td booster every 10 years. ?Zoster vaccine. You may need this after age 72. ?Pneumococcal 13-valent conjugate (PCV13) vaccine. You may need this if you have certain conditions and were not previously vaccinated. ?Pneumococcal polysaccharide (PPSV23) vaccine. You may need one or two doses if you smoke cigarettes or if you have certain conditions. ?Talk to your health care provider about which screenings and vaccines you need and how often you need them. ?This information is not intended to replace advice given to you by your health care provider. Make sure you discuss any questions you have with your health care provider. ?Document Released: 12/11/2015 Document Revised: 08/03/2016 Document Reviewed: 09/15/2015 ?Elsevier Interactive Patient Education ? 2017 [employer] ? ? ? ?Fall Prevention in the Home ?Falls can cause injuries. They can happen to people of all ages. There are many things you can do to make your home safe and to help prevent falls. ?What can I do on the outside of my home? ?Regularly fix the edges of walkways and driveways and fix any cracks. ?Remove anything that might make you trip as you walk through a door, such as a raised step or threshold. ?Trim any bushes or trees on the path to your home. ?Use bright outdoor lighting. ?Clear any walking paths of anything that might make someone trip, such as rocks or tools. ?Regularly check to see if handrails are loose or broken. Make sure that both sides of any steps have handrails. ?Any raised decks and porches should have guardrails on the edges. ?Have any leaves, snow, or ice cleared regularly. ?Use sand or  salt on walking paths during winter. ?Clean up any spills in your garage right away. This includes oil or grease spills. ?What can I do in the bathroom? ?Use night lights. ?Install grab bars by the toilet and in the tub and shower. Do not use towel bars as grab bars. ?Use non-skid mats or decals in the tub or shower. ?If you need to sit down in the shower, use a plastic, non-slip stool. ?Keep the floor dry. Clean up any water that spills on the floor as soon as it happens. ?Remove soap buildup in the tub or shower regularly. ?Attach bath mats securely with double-sided non-slip rug tape. ?Do not have throw rugs and other things on the floor that can make you trip. ?What can I do in the bedroom? ?Use night lights. ?Make sure that you have a light by your bed that is easy to reach. ?Do not use any sheets or blankets that are too big for your bed. They should not hang down onto the floor. ?Have a firm chair that has side arms. You can use this for support while you get dressed. ?Do not have throw rugs and other things on the floor that can make you trip. ?What can I do in the kitchen? ?Clean up any spills right away. ?Avoid walking on wet floors. ?Keep items that you use a lot in easy-to-reach places. ?If you need to reach something above  you, use a strong step stool that has a grab bar. ?Keep electrical cords out of the way. ?Do not use floor polish or wax that makes floors slippery. If you must use wax, use non-skid floor wax. ?Do not have throw rugs and other things on the floor that can make you trip. ?What can I do with my stairs? ?Do not leave any items on the stairs. ?Make sure that there are handrails on both sides of the stairs and use them. Fix handrails that are broken or loose. Make sure that handrails are as long as the stairways. ?Check any carpeting to make sure that it is firmly attached to the stairs. Fix any carpet that is loose or worn. ?Avoid having throw rugs at the top or bottom of the stairs. If  you do have throw rugs, attach them to the floor with carpet tape. ?Make sure that you have a light switch at the top of the stairs and the bottom of the stairs. If you do not have them, ask someone to add them for you. ?Wh

## 2022-02-10 NOTE — Assessment & Plan Note (Signed)
Referral to PT ordered today. Explained that I think her back pain and hip pain is due to the fibromyalgia. Will try a Medrol Dosepak for possible bursitis of the right hip and muscle relaxer for back spasms. Referral placed to PT. Seeing Rheumatology.  ?

## 2022-02-10 NOTE — Patient Instructions (Addendum)
It was great seeing you today! ? ?Plan discussed at today's visit: ?-Blood work ordered today, results will be uploaded to MyChart.  ?-Referral placed to PT  ?-Low dose steroid pack and muscle relaxer sent to pharmacy, if you have an reaction, stop the medication right away and go to the ER ? ?Follow up in: 6 months  ? ?Take care and let us know if you have any questions or concerns prior to your next visit. ? ?Dr. Caralee Ates ? ? ?Bursitis ?Bursitis is inflammation and irritation of a bursa, which is one of the small, fluid-filled sacs that cushion and protect the moving parts of your body. These sacs are located between bones and muscles, bones and muscle attachments, or bones and skin areas that are next to bones. A bursa protects those structures from the wear and tear that results from frequent movement. ?An inflamed bursa causes pain and swelling. Fluid may build up inside the sac. Bursitis is most common near joints, especially the knees, elbows, hips, and shoulders. ?What are the causes? ?This condition may be caused by: ?Injury from: ?A direct hit (blow), like falling on your knee or elbow. ?Overuse of a joint (repetitive stress). ?Infection. This can happen if bacteria get into a bursa through a cut or scrape near a joint. ?Diseases that cause joint inflammation, such as gout and rheumatoid arthritis. ?What increases the risk? ?You are more likely to develop this condition if you: ?Have a job or hobby that involves a lot of repetitive stress on your joints. ?Have a condition that weakens your body's defense system (immune system), such as diabetes, cancer, or HIV. ?Do any of these often: ?Lift and reach overhead. ?Kneel or lean on hard surfaces. ?Run or walk. ?What are the signs or symptoms? ?The most common symptoms of this condition include: ?Pain that gets worse when you move the affected body part or use it to support (bear) your body weight. ?Inflammation. ?Stiffness. ?Other symptoms  include: ?Redness. ?Swelling. ?Tenderness. ?Warmth. ?Pain that continues after rest. ?Fever or chills. These may occur in bursitis that is caused by infection. ?How is this diagnosed? ?This condition may be diagnosed based on: ?Your medical history and a physical exam. ?Imaging tests, such as an MRI. ?A procedure to drain fluid from the bursa with a needle (aspiration). The fluid may be checked for signs of infection or gout. ?Blood tests to rule out other causes of inflammation. ?How is this treated? ?This condition can usually be treated at home with rest, ice, applying pressure (compression), and raising the body part that is affected (elevation). This is called RICE therapy. For mild bursitis, RICE therapy may be all you need. Other treatments may include: ?NSAIDs to treat pain and inflammation. ?Corticosteroid medicines to fight inflammation. These medicines may be injected into and around the area of bursitis. ?Aspiration of fluid from the bursa to relieve pain and improve movement. ?Antibiotic medicine to treat an infected bursa. ?A splint, brace, or walking aid, such as a cane. ?Physical therapy if you continue to have pain or limited movement. ?Surgery to remove a damaged or infected bursa. This may be needed if other treatments have not worked. ?Follow these instructions at home: ?Medicines ?Take over-the-counter and prescription medicines only as told by your health care provider. ?If you were prescribed an antibiotic medicine, take it as told by your health care provider. Do not stop taking the antibiotic even if you start to feel better. ?General instructions ? ?Rest the affected area as told by  your health care provider. ?If possible, raise (elevate) the affected area above the level of your heart while you are sitting or lying down. ?Avoid activities that make pain worse. ?Use splints, braces, pads, or walking aids as told by your health care provider. ?If directed, put ice on the affected area: ?If  you have a removable splint or brace, remove it as told by your health care provider. ?Put ice in a plastic bag. ?Place a towel between your skin and the bag or between your splint or brace and the bag. ?Leave the ice on for 20 minutes, 2-3 times a day. ?Keep all follow-up visits as told by your health care provider. This is important. ?Preventing future episodes ?Take actions to help prevent future episodes of bursitis. ?Wear knee pads if you kneel often. ?Wear sturdy running or walking shoes that fit you well. ?Take breaks regularly from repetitive activity. ?Warm up by stretching before doing any activity that takes a lot of effort. ?Maintain a healthy weight or lose weight as recommended by your health care provider. If you need help doing this, ask your health care provider. ?Exercise regularly. Start any new physical activity gradually. ?Contact a health care provider if you: ?Have a fever. ?Have chills. ?Have bursitis that is not getting better with treatment or home care. ?Summary ?Bursitis is inflammation and irritation of a bursa, which is one of the small, fluid-filled sacs that cushion and protect the moving parts of your body. ?An inflamed bursa causes pain and swelling. ?Bursitis is commonly diagnosed with a physical exam, but other tests are sometimes needed. ?This condition can usually be treated at home with rest, ice, applying pressure (compression), and raising the body part that is affected (elevation). This is called RICE therapy. ?This information is not intended to replace advice given to you by your health care provider. Make sure you discuss any questions you have with your health care provider. ?Document Revised: 04/22/2020 Document Reviewed: 05/20/2020 ?Elsevier Patient Education ? 2022 Elsevier Inc. ?

## 2022-02-10 NOTE — Assessment & Plan Note (Signed)
Check levels today 

## 2022-02-10 NOTE — Progress Notes (Signed)
? ?Acute Office Visit ? ?Subjective:  ? ? Patient ID: Yesenia Wood, female    DOB: Aug 22, 1986, 36 y.o.   MRN: [id number] ? ?Chief Complaint  ?Patient presents with  ? Follow-up  ? ? ?HPI ?Patient is in today for follow-up. Interpreter present to help with communication.   ? ?Fibromyalgia: ?-ANA positive, seeing Rheumatology ?-Is having some right hip pain that she says is worse lately, located right the greater trochanter, sometimes radiates to IT band. Gets some bad that sometimes she limps. Has allergy to NSAIDs but can take steroids, had reaction to steroid inhaler once.  ?-Also complaining of back spasms, tried Flexeril in the past that didn't help ? ?Vitamin Deficiencies: ?-Currently on Iron while on her period, calcium, Vitamin B12 monthly and folate ?-Vitamin D OCT daily, thinks its 250 mcg (uncertain dose) ? ?HLD: ?-Medications: Crestor 20 ?-Patient is compliant with above medications and reports no side effects.  ?-Last lipid panel: Lipid Panel  ?   ?Component Value Date/Time  ? CHOL 85 10/09/2020 1114  ? CHOL 192 10/29/2015 1050  ? TRIG 107 10/09/2020 1114  ? HDL 27 (L) 10/09/2020 1114  ? HDL 43 10/29/2015 1050  ? CHOLHDL 3.1 10/09/2020 1114  ? VLDL 20 10/10/2016 1235  ? LDLCALC 39 10/09/2020 1114  ? LABVLDL 26 10/29/2015 1050  ? ?Health Maintenance: ?-Blood work up to date besides lipid panel  ?-Pap 4/22 negative  ? ?Past Medical History:  ?Diagnosis Date  ? ADHD (attention deficit hyperactivity disorder)   ? B12 deficiency   ? Bell's palsy   ? right sided  ? Chest pain   ? a. 05/2015 ETT: elev HR/BP, but no acute ST/T changes; b. 09/2019 Cor CTA: Nl cors, Ca2+ = 0.  ? Chronic migraine   ? Deaf   ? Depression   ? Dissocial personality disorder (HCC)   ? 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 Fatigue   ? Fatty liver   ? Fibromyalgia   ? GERD (gastroesophageal reflux disease)   ? Headache   ? High triglycerides   ? Hyperglycemia   ? Hypertension   ? Hyperthyroidism   ? Moderate asthma   ?  Obesity   ? Paresthesia   ? Premature birth   ? ? ?Past Surgical History:  ?Procedure Laterality Date  ? BARTHOLIN GLAND CYST REMOVAL    ? COCHLEAR IMPLANT    ? COCHLEAR IMPLANT REMOVAL    ? DILATATION AND CURETTAGE/HYSTEROSCOPY WITH MINERVA N/A 03/05/2021  ? Procedure: DILATATION AND CURETTAGE/HYSTEROSCOPY WITH MINERVA;  Surgeon: Hildred Laser, MD;  Location: [facility] ORS;  Service: Gynecology;  Laterality: N/A;  ? LAPAROSCOPIC TUBAL LIGATION Bilateral 03/26/2018  ? Procedure: LAPAROSCOPIC TUBAL LIGATION;  Surgeon: Hildred Laser, MD;  Location: [facility] OR[facility];  Service: Gynecology;  Laterality: Bilateral;  with peritoneal biopsies  ? LAPAROSCOPY    ? MYRINGOTOMY WITH TUBE PLACEMENT    ? TUBAL LIGATION  03/26/2018  ? ? ?Family History  ?Adopted: Yes  ?Family history unknown: Yes  ? ? ?Social History  ? ?Socioeconomic History  ? Marital status: Single  ?  Spouse name: Not on file  ? Number of children: 0  ? Years of education: Not on file  ? Highest education level: Some college, no degree  ?Occupational History  ? Occupation: disability for hearing loss  ?Tobacco Use  ? Smoking status: Former  ?  Packs/day: 1.00  ?  Years: 2.00  ?  Pack years: 2.00  ?  Types: Cigarettes  ?  Start date: 11/28/2004  ?  Quit date: 11/28/2006  ?  Years since quitting: 15.2  ? Smokeless tobacco: Never  ?Vaping Use  ? Vaping Use: Never used  ?Substance and Sexual Activity  ? Alcohol use: Yes  ?  Comment: 1 every 3 months  ? Drug use: No  ? Sexual activity: Not Currently  ?  Birth control/protection: Pill  ?  Comment: Seasonale  ?Other Topics Concern  ? Not on file  ?Social History Narrative  ? Lives with her adopted  parents  ? Hearing loss since birth   ? ?Social Determinants of Health  ? ?Financial Resource Strain: Not on file  ?Food Insecurity: Not on file  ?Transportation Needs: Not on file  ?Physical Activity: Not on file  ?Stress: Not on file  ?Social Connections: Not on file  ?Intimate Partner Violence: Not on file  ? ? ?Outpatient Medications  Prior to Visit  ?Medication Sig Dispense Refill  ? acebutolol (SECTRAL) 200 MG capsule TAKE 1 CAPSULE BY MOUTH TWICE A DAY 180 capsule 2  ? acetaminophen (TYLENOL) 500 MG tablet Take 2 tablets (1,000 mg total) by mouth every 6 (six) hours as needed. 100 tablet 2  ? BOTOX 100 units SOLR injection every 3 (three) months.     ? calcium carbonate (TUMS - DOSED IN MG ELEMENTAL CALCIUM) 500 MG chewable tablet Chew 1 tablet by mouth daily as needed for indigestion or heartburn.    ? Cyanocobalamin (VITAMIN B-12) 500 MCG SUBL Place 1 tablet (500 mcg total) under the tongue once a week. 30 tablet 0  ? diphenhydrAMINE (BENADRYL) 50 MG tablet Take 50 mg by mouth at bedtime as needed for itching.    ? docusate sodium (COLACE) 100 MG capsule Take 100 mg by mouth daily as needed for mild constipation.    ? ferrous sulfate 325 (65 FE) MG tablet Take 325 mg by mouth daily with breakfast.    ? folic acid (FOLVITE) 800 MCG tablet Take 800 mcg by mouth daily. 800 mcg    ? ibuprofen (ADVIL) 800 MG tablet Take 1 tablet (800 mg total) by mouth every 8 (eight) hours as needed for mild pain. (Patient not taking: Reported on 12/20/2021) 30 tablet 0  ? ondansetron (ZOFRAN ODT) 4 MG disintegrating tablet Take 1 tablet (4 mg total) by mouth every 6 (six) hours as needed for nausea or vomiting. 20 tablet 0  ? rosuvastatin (CRESTOR) 20 MG tablet TAKE 1 TABLET BY MOUTH EVERY DAY 90 tablet 1  ? simethicone (MYLICON) 125 MG chewable tablet Chew 125 mg by mouth every 6 (six) hours as needed for flatulence.    ? tizanidine (ZANAFLEX) 2 MG capsule Take 1 capsule (2 mg total) by mouth 3 (three) times daily. (Patient not taking: Reported on 12/20/2021) 30 capsule 0  ? ?No facility-administered medications prior to visit.  ? ? ?Allergies  ?Allergen Reactions  ? Budesonide-Formoterol Fumarate Anaphylaxis and Other (See Comments)  ?  Other reaction(s): Other (See Comments) ?chest pain ?Chest pain ?chest pain  ? Astelin [Azelastine]   ?  Tingling, numbness,  nausea  ? Gabapentin Nausea And Vomiting  ? Nortriptyline Other (See Comments)  ?  Other reaction(s): Other (See Comments)  ? Lamictal [Lamotrigine]   ?  Self injurious thoughts  ? Milnacipran Other (See Comments)  ?  Pressure in eyes  ? Tegaderm Ag Mesh [Silver] Itching  ? Diclofenac Other (See Comments)  ?  Other reaction(s): Other (See Comments)  ? Duloxetine Hcl Other (See Comments)  ?  Other reaction(s): Other (See Comments) ?Other reaction(s): unknown  ? Montelukast Sodium Other (See Comments)  ?  Other reaction(s): wheezing/sob  ? Mupirocin Other (See Comments)  ? Naproxen Other (See Comments) and Rash  ?  Other reaction(s): rash/itching  ? Naproxen Sodium Rash  ?  mild rash  ? Other Rash  ? Oxycodone Hcl Rash  ? Sulfa Antibiotics Hives, Rash and Itching  ? ? ?Review of Systems  ?Constitutional:  Positive for fatigue. Negative for chills and fever.  ?Eyes:  Negative for visual disturbance.  ?Respiratory:  Negative for cough.   ?Cardiovascular:  Negative for chest pain.  ?Gastrointestinal:  Negative for abdominal pain.  ?Musculoskeletal:  Positive for back pain and myalgias. Negative for joint swelling, neck pain and neck stiffness.  ?Skin: Negative.   ?Neurological:  Positive for weakness. Negative for numbness.  ? ?   ?Objective:  ?  ?Physical Exam ?Constitutional:   ?   Appearance: Normal appearance.  ?HENT:  ?   Head: Normocephalic and atraumatic.  ?Eyes:  ?   Conjunctiva/sclera: Conjunctivae normal.  ?Cardiovascular:  ?   Rate and Rhythm: Normal rate and regular rhythm.  ?Pulmonary:  ?   Effort: Pulmonary effort is normal.  ?   Breath sounds: Normal breath sounds.  ?Musculoskeletal:     ?   General: Tenderness present.  ?   Right lower leg: No edema.  ?   Left lower leg: No edema.  ?Skin: ?   General: Skin is warm and dry.  ?   Comments: Healing hematoma in left calf.   ?Neurological:  ?   General: No focal deficit present.  ?   Mental Status: She is alert. Mental status is at baseline.  ?Psychiatric:      ?   Mood and Affect: Mood normal.     ?   Behavior: Behavior normal.  ? ? ?BP 102/62   Pulse 81   Temp 98.3 ?F (36.8 ?C)   Resp 16   Ht 5\' 3"  (1.6 m)   Wt 169 lb 9.6 oz (76.9 kg)   LMP 01/23/2022 (Exact Date)

## 2022-02-11 LAB — CBC WITH DIFFERENTIAL/PLATELET
Absolute Monocytes: 738 cells/uL (ref 200–950)
Basophils Absolute: 43 cells/uL (ref 0–200)
Basophils Relative: 0.7 %
Eosinophils Absolute: 19 cells/uL (ref 15–500)
Eosinophils Relative: 0.3 %
HCT: 44 % (ref 35.0–45.0)
Hemoglobin: 14 g/dL (ref 11.7–15.5)
Lymphs Abs: 2449 cells/uL (ref 850–3900)
MCH: 28.4 pg (ref 27.0–33.0)
MCHC: 31.8 g/dL — ABNORMAL LOW (ref 32.0–36.0)
MCV: 89.2 fL (ref 80.0–100.0)
MPV: 10.3 fL (ref 7.5–12.5)
Monocytes Relative: 11.9 %
Neutro Abs: 2951 cells/uL (ref 1500–7800)
Neutrophils Relative %: 47.6 %
Platelets: 279 10*3/uL (ref 140–400)
RBC: 4.93 10*6/uL (ref 3.80–5.10)
RDW: 12.2 % (ref 11.0–15.0)
Total Lymphocyte: 39.5 %
WBC: 6.2 10*3/uL (ref 3.8–10.8)

## 2022-02-11 LAB — LIPID PANEL
Cholesterol: 130 mg/dL (ref <200)
HDL: 48 mg/dL — ABNORMAL LOW (ref >=50)
LDL Cholesterol (Calc): 61 mg/dL (calc)
Non-HDL Cholesterol (Calc): 82 mg/dL (calc) (ref <130)
Total CHOL/HDL Ratio: 2.7 (calc) (ref <5.0)
Triglycerides: 126 mg/dL (ref <150)

## 2022-02-11 LAB — IRON,TIBC AND FERRITIN PANEL
%SAT: 27 % (calc) (ref 16–45)
Ferritin: 19 ng/mL (ref 16–154)
Iron: 105 ug/dL (ref 40–190)
TIBC: 384 mcg/dL (calc) (ref 250–450)

## 2022-02-11 LAB — B12 AND FOLATE PANEL
Folate: 14.5 ng/mL
Vitamin B-12: 504 pg/mL (ref 200–1100)

## 2022-02-11 LAB — VITAMIN D 25 HYDROXY (VIT D DEFICIENCY, FRACTURES): Vit D, 25-Hydroxy: 30 ng/mL (ref 30–100)

## 2022-02-17 ENCOUNTER — Encounter: Payer: Self-pay | Admitting: Internal Medicine

## 2022-03-03 DIAGNOSIS — M791 Myalgia, unspecified site: Secondary | ICD-10-CM | POA: Diagnosis not present

## 2022-03-03 DIAGNOSIS — G43719 Chronic migraine without aura, intractable, without status migrainosus: Secondary | ICD-10-CM | POA: Diagnosis not present

## 2022-03-03 DIAGNOSIS — G518 Other disorders of facial nerve: Secondary | ICD-10-CM | POA: Diagnosis not present

## 2022-03-03 DIAGNOSIS — M542 Cervicalgia: Secondary | ICD-10-CM | POA: Diagnosis not present

## 2022-03-08 ENCOUNTER — Encounter: Payer: Self-pay | Admitting: Internal Medicine

## 2022-03-08 ENCOUNTER — Ambulatory Visit (INDEPENDENT_AMBULATORY_CARE_PROVIDER_SITE_OTHER): Payer: Medicare Other | Admitting: Licensed Clinical Social Worker

## 2022-03-08 DIAGNOSIS — F331 Major depressive disorder, recurrent, moderate: Secondary | ICD-10-CM | POA: Diagnosis not present

## 2022-03-08 DIAGNOSIS — F4010 Social phobia, unspecified: Secondary | ICD-10-CM | POA: Diagnosis not present

## 2022-03-08 NOTE — Progress Notes (Signed)
Virtual Visit via Audio Note ? ?I connected with Yesenia Wood on 03/08/22 at 11:00 AM EDT by an audio enabled telemedicine application and verified that I am speaking with the correct person using two identifiers.Sign language interpreter was on audio line for assistance.  ? ?Location: ?Patient: home ?Provider: [facility] ?  ?I discussed the limitations of evaluation and management by telemedicine and the availability of in person appointments. The patient expressed understanding and agreed to proceed. ?  ?I discussed the assessment and treatment plan with the patient. The patient was provided an opportunity to ask questions and all were answered. The patient agreed with the plan and demonstrated an understanding of the instructions. ?  ?The patient was advised to call back or seek an in-person evaluation if the symptoms worsen or if the condition fails to improve as anticipated. ? ?I provided 45 minutes of non-face-to-face time during this encounter. ? ? ?Azadeh Hyder R Zaria Taha, LCSW ? ? ?THERAPIST PROGRESS NOTE ? ?Session Time: 11-1145a ? ?Participation Level: Active ? ?Behavioral Response: NAAlertAnxious ? ?Type of Therapy: Individual Therapy ? ?Treatment Goals addressed: Problem: Decrease depressive symptoms and improve levels of effective functioning ?Goal: LTG: Reduce frequency, intensity, and duration of depression symptoms as evidenced by: pt self report ?Outcome: Not Progressing ?Note: Pt reporting continuing low energy, hypersomnia, racing thoughts, self-frustration, low energy. ?Goal: STG: Jacki Cones WILL PARTICIPATE IN AT LEAST 80% OF SCHEDULED INDIVIDUAL PSYCHOTHERAPY SESSIONS ?Outcome: Progressing ?Note: Pt on time and engaged throughout session ?Intervention: Encourage verbalization of feelings/concerns/expectations ?Intervention: Continue cognitive behavioral therapy for positive physical activities, stimulating cognitive activities, increase engagement with others socially ?  ?Problem: Reduce overall  frequency, intensity, and duration of the anxiety so that daily functioning is not impaired. ?Goal: LTG: Patient will score less than 5 on the Generalized Anxiety Disorder 7 Scale (GAD-7) ?Outcome: Progressing ?Intervention: Encourage patient to identify triggers ?Intervention: Assist with relaxation techniques, as appropriate (deep breathing exercises, meditation, guided imagery) ? ?Interventions: CBT (see above) ? ?Summary: OVAL MORALEZ is a 36 y.o. female who presents with stable symptoms related to depression and social anxiety diagnoses. Patient reports that she is experiencing more depression symptoms than in previous sessions. Pt reporting low energy, low motivation, hypersomnia, racing thoughts, frustrated/irritable at self, and feeling sluggish all the time. Pt admits that she is not engaging in much physical activity due to lack of motivation. Pt reports that she is not engaging in other cognitively stimulating activities due to lack of motivation and initiative.  ? ?Pt is currently not taking medication for depression, anxiety, or insomnia. Discussed pts lack of progress and making positive changes to help support better outcomes. Pt agrees to try medication with psychiatrist again.  ? ?Allowed pt to explore and express thoughts and feelings associated with recent life situations and external stressors. Pt reports good relationship with parents and friends ? ?Continued recommendations are as follows: self care behaviors, positive social engagements, focusing on overall work/home/life balance, and focusing on positive physical and emotional wellness.  ? ?Suicidal/Homicidal: No ? ?Therapist Response: Pt is continuing to apply interventions learned in session into daily life situations. Personal growth and progress is fluctuating/intermittent at time of session.Treatment to continue as indicated.  ? ?Currently pt reports that she is ready to try medication management of symptoms. ? ?Plan: Return again in  4 weeks. ? ?Diagnosis:  ?Encounter Diagnoses  ?Name Primary?  ? MDD (major depressive disorder), recurrent episode, moderate (HCC) Yes  ? Social anxiety disorder   ? ?Collaboration of Care: Other  pt requested referral back to psychiatrist of record, Dr. Jomarie Longs ? ?Patient/Guardian was advised Release of Information must be obtained prior to any record release in order to collaborate their care with an outside provider. Patient/Guardian was advised if they have not already done so to contact the registration department to sign all necessary forms in order for [place] to release information regarding their care.  ? ?Consent: Patient/Guardian gives verbal consent for treatment and assignment of benefits for services provided during this visit. Patient/Guardian expressed understanding and agreed to proceed.  ? ? ?Deshon Hsiao R Arlana Canizales, LCSW ?03/08/2022 ? ?

## 2022-03-09 NOTE — Plan of Care (Signed)
?  Problem: Decrease depressive symptoms and improve levels of effective functioning ?Goal: LTG: Reduce frequency, intensity, and duration of depression symptoms as evidenced by: pt self report ?Outcome: Not Progressing ?Note: Pt reporting continuing low energy, hypersomnia, racing thoughts, self-frustration, low energy. ?Goal: STG: Jacki Cones WILL PARTICIPATE IN AT LEAST 80% OF SCHEDULED INDIVIDUAL PSYCHOTHERAPY SESSIONS ?Outcome: Progressing ?Note: Pt on time and engaged throughout session ?Intervention: Encourage verbalization of feelings/concerns/expectations ?Intervention: Continue cognitive behavioral therapy for positive physical activities, stimulating cognitive activities, increase engagement with others socially ?  ?Problem: Reduce overall frequency, intensity, and duration of the anxiety so that daily functioning is not impaired. ?Goal: LTG: Patient will score less than 5 on the Generalized Anxiety Disorder 7 Scale (GAD-7) ?Outcome: Progressing ?Intervention: Encourage patient to identify triggers ?Intervention: Assist with relaxation techniques, as appropriate (deep breathing exercises, meditation, guided imagery) ?  ?

## 2022-03-09 NOTE — Plan of Care (Signed)
?  Problem: Decrease depressive symptoms and improve levels of effective functioning ?Goal: LTG: Reduce frequency, intensity, and duration of depression symptoms as evidenced by: pt self report ?Outcome: Not Progressing ?Note: Pt reporting continuing low energy, hypersomnia, racing thoughts, self-frustration, low energy. ?Goal: STG: Radie WILL PARTICIPATE IN AT LEAST 80% OF SCHEDULED INDIVIDUAL PSYCHOTHERAPY SESSIONS ?Outcome: Progressing ?Note: Pt on time and engaged throughout session ?Intervention: Encourage verbalization of feelings/concerns/expectations ?Intervention: Continue cognitive behavioral therapy for positive physical activities, stimulating cognitive activities, increase engagement with others socially ?  ?Problem: Reduce overall frequency, intensity, and duration of the anxiety so that daily functioning is not impaired. ?Goal: LTG: Patient will score less than 5 on the Generalized Anxiety Disorder 7 Scale (GAD-7) ?Outcome: Progressing ?Intervention: Encourage patient to identify triggers ?Intervention: Assist with relaxation techniques, as appropriate (deep breathing exercises, meditation, guided imagery) ?  ?

## 2022-03-14 NOTE — Telephone Encounter (Signed)
I did intend to refer her based on our follow up in February. It looks like I never placed any order in the system for this so that's my fault. ?Recommendation was trial of PT or could refer to PM&R clinic if not any adequate improvement. If she was referred by Dr. Caralee Ates already that would work or if needed we can order this.

## 2022-03-25 DIAGNOSIS — G43111 Migraine with aura, intractable, with status migrainosus: Secondary | ICD-10-CM | POA: Diagnosis not present

## 2022-03-25 DIAGNOSIS — G43109 Migraine with aura, not intractable, without status migrainosus: Secondary | ICD-10-CM | POA: Diagnosis not present

## 2022-03-25 DIAGNOSIS — G43719 Chronic migraine without aura, intractable, without status migrainosus: Secondary | ICD-10-CM | POA: Diagnosis not present

## 2022-03-25 DIAGNOSIS — M791 Myalgia, unspecified site: Secondary | ICD-10-CM | POA: Diagnosis not present

## 2022-03-25 DIAGNOSIS — G518 Other disorders of facial nerve: Secondary | ICD-10-CM | POA: Diagnosis not present

## 2022-03-25 DIAGNOSIS — M542 Cervicalgia: Secondary | ICD-10-CM | POA: Diagnosis not present

## 2022-04-27 ENCOUNTER — Ambulatory Visit (INDEPENDENT_AMBULATORY_CARE_PROVIDER_SITE_OTHER): Payer: Medicare Other | Admitting: Licensed Clinical Social Worker

## 2022-04-27 DIAGNOSIS — F331 Major depressive disorder, recurrent, moderate: Secondary | ICD-10-CM | POA: Diagnosis not present

## 2022-04-27 DIAGNOSIS — F4010 Social phobia, unspecified: Secondary | ICD-10-CM

## 2022-04-27 NOTE — Plan of Care (Signed)
  Problem: Decrease depressive symptoms and improve levels of effective functioning Goal: LTG: Reduce frequency, intensity, and duration of depression symptoms as evidenced by: pt self report Outcome: Progressing Note: Symptoms fluctuating/intermittent  Intervention: REVIEW PLEASE SKILLS (TREAT PHYSICAL ILLNESS, BALANCE EATING, AVOID MOOD-ALTERING SUBSTANCES, BALANCE SLEEP AND GET EXERCISE) WITH Yesenia Wood Note: reviewed   Problem: Reduce overall frequency, intensity, and duration of the anxiety so that daily functioning is not impaired. Goal: LTG: Patient will score less than 5 on the Generalized Anxiety Disorder 7 Scale (GAD-7) Outcome: Progressing Intervention: Assist with relaxation techniques, as appropriate (deep breathing exercises, meditation, guided imagery) Description: encouraged Intervention: Encourage new environment or opportunities for social interaction Description: Encouraged--pt has been seeking out events

## 2022-04-27 NOTE — Progress Notes (Signed)
Virtual Visit via Audio Note  I connected with Yesenia Wood on 04/27/22 at 11:00 AM EDT by an audio enabled telemedicine application and verified that I am speaking with the correct person using two identifiers.Sign language interpreter was on audio line for assistance.   Location: Patient: home Provider: [facility]   I discussed the limitations of evaluation and management by telemedicine and the availability of in person appointments. The patient expressed understanding and agreed to proceed.   I discussed the assessment and treatment plan with the patient. The patient was provided an opportunity to ask questions and all were answered. The patient agreed with the plan and demonstrated an understanding of the instructions.   The patient was advised to call back or seek an in-person evaluation if the symptoms worsen or if the condition fails to improve as anticipated.  I provided 45 minutes of non-face-to-face time during this encounter.   Andray Assefa R Mailynn Everly, LCSW   THERAPIST PROGRESS NOTE  Session Time: 11-1145a  Participation Level: Active  Behavioral Response: NAAlertAnxious  Type of Therapy: Individual Therapy  Treatment Goals addressed: Problem: Decrease depressive symptoms and improve levels of effective functioning Goal: LTG: Reduce frequency, intensity, and duration of depression symptoms as evidenced by: pt self report Outcome: Progressing Note: Symptoms fluctuating/intermittent  Intervention: REVIEW PLEASE SKILLS (TREAT PHYSICAL ILLNESS, BALANCE EATING, AVOID MOOD-ALTERING SUBSTANCES, BALANCE SLEEP AND GET EXERCISE) WITH Jacki Cones Note: reviewed   Problem: Reduce overall frequency, intensity, and duration of the anxiety so that daily functioning is not impaired. Goal: LTG: Patient will score less than 5 on the Generalized Anxiety Disorder 7 Scale (GAD-7) Outcome: Progressing Intervention: Assist with relaxation techniques, as appropriate (deep breathing exercises,  meditation, guided imagery) Description: encouraged Intervention: Encourage new environment or opportunities for social interaction Description: Encouraged--pt has been seeking out events  Interventions: CBT   Summary: Yesenia Wood is a 36 y.o. female who presents with stable symptoms related to depression and social anxiety diagnoses.   Allowed pt to explore and express thoughts and feelings associated with recent life situations and external stressors. Pt reports good relationship with parents and friends.  Allowed pt to explore and express thoughts and feelings associated with recent life situations and external stressors. Discussed recent Expo in [place] that was hosted by hearing-impaired community. Pt reports that she spent 30 minutes there and had an opportunity to get resources from many of the vendors/booths that were there. Pt reports that she received a lot of beneficial information.   Pt reports relationships with parents are good. Pt states she often feels like she is a "burden" and would like to stop these intrusive thoughts. Allowed pt to explore relationship between herself and her parents and see how beneficial she has been to them recently and how they have been beneficial to her. Discussed "thought stopping" whenever the intrusive thoughts pop in her head and immediately seek to label the thoughts as "thoughts" and not "facts" since she has no evidence to support the thoughts.  Pt reflects understanding and will try next time she has intrusive thoughts.    Continued recommendations are as follows: self care behaviors, positive social engagements, focusing on overall work/home/life balance, and focusing on positive physical and emotional wellness.   Suicidal/Homicidal: No  Therapist Response: Pt is continuing to apply interventions learned in session into daily life situations. Personal growth and progress is continuing at time of session.Treatment to continue as indicated.    Plan: Return again in 4 weeks.  Diagnosis:  Encounter Diagnoses  Name Primary?   MDD (major depressive disorder), recurrent episode, moderate (HCC) Yes   Social anxiety disorder    Collaboration of Care: Other pt requested referral back to psychiatrist of record, Dr. Jomarie Longs  Patient/Guardian was advised Release of Information must be obtained prior to any record release in order to collaborate their care with an outside provider. Patient/Guardian was advised if they have not already done so to contact the registration department to sign all necessary forms in order for [place] to release information regarding their care.   Consent: Patient/Guardian gives verbal consent for treatment and assignment of benefits for services provided during this visit. Patient/Guardian expressed understanding and agreed to proceed.    Ernest Haber Arella Blinder, LCSW 04/27/2022

## 2022-05-04 DIAGNOSIS — G43109 Migraine with aura, not intractable, without status migrainosus: Secondary | ICD-10-CM | POA: Diagnosis not present

## 2022-05-04 DIAGNOSIS — G518 Other disorders of facial nerve: Secondary | ICD-10-CM | POA: Diagnosis not present

## 2022-05-04 DIAGNOSIS — G43111 Migraine with aura, intractable, with status migrainosus: Secondary | ICD-10-CM | POA: Diagnosis not present

## 2022-05-04 DIAGNOSIS — M542 Cervicalgia: Secondary | ICD-10-CM | POA: Diagnosis not present

## 2022-05-04 DIAGNOSIS — G43719 Chronic migraine without aura, intractable, without status migrainosus: Secondary | ICD-10-CM | POA: Diagnosis not present

## 2022-05-04 DIAGNOSIS — M791 Myalgia, unspecified site: Secondary | ICD-10-CM | POA: Diagnosis not present

## 2022-05-26 ENCOUNTER — Other Ambulatory Visit: Payer: Self-pay | Admitting: Internal Medicine

## 2022-05-26 DIAGNOSIS — E785 Hyperlipidemia, unspecified: Secondary | ICD-10-CM

## 2022-05-26 NOTE — Telephone Encounter (Signed)
Requested Prescriptions  Pending Prescriptions Disp Refills  . rosuvastatin (CRESTOR) 20 MG tablet [Pharmacy Med Name: ROSUVASTATIN CALCIUM 20 MG TAB] 90 tablet 1    Sig: TAKE 1 TABLET BY MOUTH EVERY DAY     Cardiovascular:  Antilipid - Statins 2 Failed - 05/26/2022  2:21 AM      Failed - Lipid Panel in normal range within the last 12 months    Cholesterol, Total  Date Value Ref Range Status  10/29/2015 192 100 - 199 mg/dL Final   Cholesterol  Date Value Ref Range Status  02/10/2022 130 <200 mg/dL Final   LDL Cholesterol (Calc)  Date Value Ref Range Status  02/10/2022 61 mg/dL (calc) Final    Comment:    Reference range: <100 . Desirable range <100 mg/dL for primary prevention;   <70 mg/dL for patients with CHD or diabetic patients  with > or = 2 CHD risk factors. Marland Kitchen LDL-C is now calculated using the Martin-Hopkins  calculation, which is a validated novel method providing  better accuracy than the Friedewald equation in the  estimation of LDL-C.  Horald Pollen et al. Lenox Ahr. 6378;588(50): 2061-2068  (http://education.[facility].com/faq/FAQ164)    HDL  Date Value Ref Range Status  02/10/2022 48 (L) > OR = 50 mg/dL Final  27/74/1287 43 >86 mg/dL Final   Triglycerides  Date Value Ref Range Status  02/10/2022 126 <150 mg/dL Final         Passed - Cr in normal range and within 360 days    Creat  Date Value Ref Range Status  07/26/2021 0.72 0.50 - 0.97 mg/dL Final         Passed - Patient is not pregnant      Passed - Valid encounter within last 12 months    Recent Outpatient Visits          3 months ago Fibromyalgia   [facility] Margarita Mail, DO   7 months ago Fibromyalgia   [facility] Margarita Mail, DO   10 months ago Fatty infiltration of liver   [facility] Caro Laroche, DO   1 year ago Sore throat   [facility] Dense M, DO   1 year ago Mild  persistent asthma without complication   [facility] Alba Cory, MD      Future Appointments            In 2 months Carlynn Purl, Danna Hefty, MD [facility], [facility]

## 2022-06-13 DIAGNOSIS — M791 Myalgia, unspecified site: Secondary | ICD-10-CM | POA: Diagnosis not present

## 2022-06-13 DIAGNOSIS — M542 Cervicalgia: Secondary | ICD-10-CM | POA: Diagnosis not present

## 2022-06-13 DIAGNOSIS — G43719 Chronic migraine without aura, intractable, without status migrainosus: Secondary | ICD-10-CM | POA: Diagnosis not present

## 2022-06-13 DIAGNOSIS — G518 Other disorders of facial nerve: Secondary | ICD-10-CM | POA: Diagnosis not present

## 2022-06-27 ENCOUNTER — Ambulatory Visit (INDEPENDENT_AMBULATORY_CARE_PROVIDER_SITE_OTHER): Payer: Medicare Other | Admitting: Licensed Clinical Social Worker

## 2022-06-27 DIAGNOSIS — F331 Major depressive disorder, recurrent, moderate: Secondary | ICD-10-CM

## 2022-06-27 DIAGNOSIS — F4010 Social phobia, unspecified: Secondary | ICD-10-CM

## 2022-06-27 NOTE — Progress Notes (Unsigned)
Virtual Visit via Audio Note  I connected with Yesenia Wood on 06/27/22 at 11:00 AM EDT by an audio enabled telemedicine application and verified that I am speaking with the correct person using two identifiers.Sign language interpreter was on audio line for assistance.   Location: Patient: home Provider: remote office [place], [place])   I discussed the limitations of evaluation and management by telemedicine and the availability of in person appointments. The patient expressed understanding and agreed to proceed.   I discussed the assessment and treatment plan with the patient. The patient was provided an opportunity to ask questions and all were answered. The patient agreed with the plan and demonstrated an understanding of the instructions.   The patient was advised to call back or seek an in-person evaluation if the symptoms worsen or if the condition fails to improve as anticipated.  I provided 45 minutes of non-face-to-face time during this encounter.   Yesenia Geisen R [name], LCSW   THERAPIST PROGRESS NOTE  Session Time: 11-1145a  Participation Level: Active  Behavioral Response: NAAlertAnxious  Type of Therapy: Individual Therapy  Treatment Goals addressed: Problem: Decrease depressive symptoms and improve levels of effective functioning Goal: LTG: Reduce frequency, intensity, and duration of depression symptoms as evidenced by: pt self report Outcome: Progressing Note: Symptoms fluctuating/intermittent  Intervention: REVIEW PLEASE SKILLS (TREAT PHYSICAL ILLNESS, BALANCE EATING, AVOID MOOD-ALTERING SUBSTANCES, BALANCE SLEEP AND GET EXERCISE) WITH Yesenia Wood Note: reviewed   Problem: Reduce overall frequency, intensity, and duration of the anxiety so that daily functioning is not impaired. Goal: LTG: Patient will score less than 5 on the Generalized Anxiety Disorder 7 Scale (GAD-7) Outcome: Progressing Intervention: Assist with relaxation techniques, as appropriate (deep  breathing exercises, meditation, guided imagery) Description: encouraged Intervention: Encourage new environment or opportunities for social interaction Description: Encouraged--pt has been seeking out events  Interventions: CBT; supportive; narrative   Summary: Yesenia Wood is a 36 y.o. female who presents with stable symptoms related to depression and social anxiety diagnoses. Pt reports that she is continuing to experience erratic sleep patterns  Allowed pt to explore and express thoughts and feelings associated with recent life situations and external stressors. Pt reports good relationship with parents and friends.Pt reports that she is continuing to cook and bake--two activities that she enjoys.    Allowed pt to explore and express thoughts and feelings associated with recent life situations and external stressors.    Continued recommendations are as follows: self care behaviors, positive social engagements, focusing on overall work/home/life balance, and focusing on positive physical and emotional wellness.   Suicidal/Homicidal: No  Therapist Response: Pt is continuing to apply interventions learned in session into daily life situations. Personal growth and progress is continuing at time of session.Treatment to continue as indicated.   Plan: Return again in 4 weeks.  Diagnosis:  Encounter Diagnoses  Name Primary?   MDD (major depressive disorder), recurrent episode, moderate (HCC) Yes   Social anxiety disorder    Collaboration of Care: Other pt requested referral back to psychiatrist of record, Dr. Jomarie Wood  Patient/Guardian was advised Release of Information must be obtained prior to any record release in order to collaborate their care with an outside provider. Patient/Guardian was advised if they have not already done so to contact the registration department to sign all necessary forms in order for [place] to release information regarding their care.   Consent:  Patient/Guardian gives verbal consent for treatment and assignment of benefits for services provided during this visit. Patient/Guardian expressed understanding  and agreed to proceed.    Yesenia Wood Yesenia Micale, LCSW 06/27/2022

## 2022-06-29 NOTE — Plan of Care (Signed)
  Problem: Decrease depressive symptoms and improve levels of effective functioning Goal: LTG: Reduce frequency, intensity, and duration of depression symptoms as evidenced by: pt self report Outcome: Progressing Goal: STG: Yesenia Wood WILL PARTICIPATE IN AT LEAST 80% OF SCHEDULED INDIVIDUAL PSYCHOTHERAPY SESSIONS Outcome: Progressing   Problem: Reduce overall frequency, intensity, and duration of the anxiety so that daily functioning is not impaired. Goal: LTG: Patient will score less than 5 on the Generalized Anxiety Disorder 7 Scale (GAD-7) Outcome: Progressing

## 2022-07-07 ENCOUNTER — Other Ambulatory Visit: Payer: Self-pay | Admitting: Family Medicine

## 2022-07-07 NOTE — Telephone Encounter (Signed)
Requested medication (s) are due for refill today: yes  Requested medication (s) are on the active medication list: yes  Last refill:  10/14/21 #180 2 refills  Future visit scheduled: yes in 1 month  Notes to clinic:  last ordered by Jodelle Red, MD. Do  you want to refill Rx?     Requested Prescriptions  Pending Prescriptions Disp Refills   acebutolol (SECTRAL) 200 MG capsule [Pharmacy Med Name: ACEBUTOLOL 200 MG CAPSULE] 180 capsule 2    Sig: TAKE 1 CAPSULE BY MOUTH TWICE A DAY     Cardiovascular: Beta Blockers 2 Passed - 07/07/2022  9:58 AM      Passed - Cr in normal range and within 360 days    Creat  Date Value Ref Range Status  07/26/2021 0.72 0.50 - 0.97 mg/dL Final         Passed - Last BP in normal range    BP Readings from Last 1 Encounters:  02/10/22 102/62         Passed - Last Heart Rate in normal range    Pulse Readings from Last 1 Encounters:  02/10/22 81         Passed - Valid encounter within last 6 months    Recent Outpatient Visits           4 months ago Fibromyalgia   [facility] Margarita Mail, DO   8 months ago Fibromyalgia   [facility] Margarita Mail, DO   11 months ago Fatty infiltration of liver   [facility] Caro Laroche, DO   1 year ago Sore throat   [facility] [place] Dense M, DO   1 year ago Mild persistent asthma without complication   [facility] Alba Cory, MD       Future Appointments             In 1 month Carlynn Purl, Danna Hefty, MD [facility], [facility]

## 2022-07-25 ENCOUNTER — Other Ambulatory Visit: Payer: Self-pay | Admitting: Cardiology

## 2022-07-26 ENCOUNTER — Telehealth (HOSPITAL_BASED_OUTPATIENT_CLINIC_OR_DEPARTMENT_OTHER): Payer: Self-pay | Admitting: Family

## 2022-07-26 ENCOUNTER — Encounter (HOSPITAL_BASED_OUTPATIENT_CLINIC_OR_DEPARTMENT_OTHER): Payer: Self-pay

## 2022-07-26 NOTE — Telephone Encounter (Signed)
I sent a mychart message to patient to get the follow up appointment scheduled with Dr. Cristal Deer or Gillian Shields NP.

## 2022-07-26 NOTE — Telephone Encounter (Signed)
Called patient and left a message to schedule. Also called the home phone and no answer. Will try again.

## 2022-07-28 ENCOUNTER — Ambulatory Visit: Payer: Medicare Other | Attending: Nurse Practitioner | Admitting: Nurse Practitioner

## 2022-07-28 ENCOUNTER — Encounter: Payer: Self-pay | Admitting: Nurse Practitioner

## 2022-07-28 VITALS — BP 110/80 | HR 72 | Ht 63.0 in | Wt 167.6 lb

## 2022-07-28 DIAGNOSIS — R0609 Other forms of dyspnea: Secondary | ICD-10-CM | POA: Insufficient documentation

## 2022-07-28 DIAGNOSIS — R002 Palpitations: Secondary | ICD-10-CM | POA: Diagnosis not present

## 2022-07-28 DIAGNOSIS — R000 Tachycardia, unspecified: Secondary | ICD-10-CM | POA: Insufficient documentation

## 2022-07-28 DIAGNOSIS — R5383 Other fatigue: Secondary | ICD-10-CM | POA: Insufficient documentation

## 2022-07-28 NOTE — Progress Notes (Signed)
Office Visit    Patient Name: Yesenia Wood Date of Encounter: 07/28/2022  Primary Care Provider:  Alba Cory, MD Primary Cardiologist:  Yesenia Bears, MD  Chief Complaint    36 year old female with history of inappropriate sinus tachycardia, normal coronary arteries by coronary CTA with calcium score of 0 (November 2020), moderate asthma, fibromyalgia, deafness s/p failed cochlear implant, B12 deficiency, obesity, and depression, who presents for follow-up related to palpitations, dyspnea, and fatigue.  Past Medical History    Past Medical History:  Diagnosis Date   ADHD (attention deficit hyperactivity disorder)    B12 deficiency    Bell's palsy    right sided   Chest pain    a. 05/2015 ETT: elev HR/BP, but no acute ST/T changes; b. 09/2019 Cor CTA: Nl cors, Ca2+ = 0.   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   Fatigue    Fatty liver    Fibromyalgia    GERD (gastroesophageal reflux disease)    Headache    High triglycerides    Hyperglycemia    Hypertension    Hyperthyroidism    Moderate asthma    Obesity    Paresthesia    Premature birth    Past Surgical History:  Procedure Laterality Date   BARTHOLIN GLAND CYST REMOVAL     COCHLEAR IMPLANT     COCHLEAR IMPLANT REMOVAL     DILATATION AND CURETTAGE/HYSTEROSCOPY WITH MINERVA N/A 03/05/2021   Procedure: DILATATION AND CURETTAGE/HYSTEROSCOPY WITH MINERVA;  Surgeon: Yesenia Laser, MD;  Location: [facility] OR[facility];  Service: Gynecology;  Laterality: N/A;   LAPAROSCOPIC TUBAL LIGATION Bilateral 03/26/2018   Procedure: LAPAROSCOPIC TUBAL LIGATION;  Surgeon: Yesenia Laser, MD;  Location: [facility];  Service: Gynecology;  Laterality: Bilateral;  with peritoneal biopsies   LAPAROSCOPY     MYRINGOTOMY WITH TUBE PLACEMENT     TUBAL LIGATION  03/26/2018    Allergies  Allergies  Allergen Reactions   Budesonide-Formoterol Fumarate  Anaphylaxis and Other (See Comments)    Other reaction(s): Other (See Comments) chest pain Chest pain chest pain   Astelin [Azelastine]     Tingling, numbness, nausea   Gabapentin Nausea And Vomiting   Nortriptyline Other (See Comments)    Other reaction(s): Other (See Comments)   Lamictal [Lamotrigine]     Self injurious thoughts   Milnacipran Other (See Comments)    Pressure in eyes   Tegaderm Ag Mesh [Silver] Itching   Diclofenac Other (See Comments)    Other reaction(s): Other (See Comments)   Duloxetine Hcl Other (See Comments)    Other reaction(s): Other (See Comments) Other reaction(s): unknown   Montelukast Sodium Other (See Comments)    Other reaction(s): wheezing/sob   Mupirocin Other (See Comments)   Naproxen Other (See Comments) and Rash    Other reaction(s): rash/itching   Naproxen Sodium Rash    mild rash   Other Rash   Oxycodone Hcl Rash   Sulfa Antibiotics Hives, Rash and Itching    History of Present Illness    36 year old female with the above past medical history including inappropriate sinus tachycardia, normal coronary arteries by coronary CT angiogram in November 2020, moderate asthma, fibromyalgia, deafness status post cochlear implant with failure, B12 deficiency, obesity, chronic right calf swelling, and depression.  Cardiac history dates back to at least 2015, at which time she was experiencing chest pain and palpitations.  Echocardiogram at  that time was normal.  Exercise treadmill testing in July 2016, showed no evidence of ischemia, though she did have exaggerated heart rate and blood pressure response, and was placed on diltiazem.  This was subsequently transitioned to metoprolol.  In late 2020, she was evaluated for chest pain and underwent coronary CT angiogram, which showed a calcium score of 0, and normal coronary arteries.  Most recent echocardiogram in July 2021, showed normal LV function without significant valvular abnormalities.  In the setting  of ongoing palpitations in September 2021, metoprolol was discontinued in favor of acebutolol 2 mg twice daily.  This resulted in improvement in palpitations and stable heart rates in the 80-90 range.  Yesenia Wood was last seen in cardiology clinic in September 2022 following 2 ED evaluations in late August 2022 for chest and left shoulder pain.  She also had chronic right swelling.  Right lower extremity ultrasound was negative for DVT.  Work-up was unremarkable and she was subsequently treated with NSAIDs and Flexeril on an outpatient basis.  She reported ongoing right calf swelling at that time and was again encouraged to use compression socks.  Visit carried out today using remote ASL interpreter.  Over the past 5 to 6 weeks, Yesenia Wood has noted a slight increase in frequency of palpitations, typically occurring daily, described as tachypalpitations, lasting a few minutes, and resolving spontaneously.  Symptoms sometimes occur in the middle of the night, which disrupts her sleep.  With this, she is also noted some increase in dyspnea throughout the day as well as more fatigue and daytime somnolence.  She has been taking her acebutolol but notes that at least 3+ times per week, she is forgetting to take her p.m. dose.  She is not sure if she is noticing more palpitations on the nights when she misses doses.  She denies chest pain, PND, orthopnea, dizziness, syncope, or early satiety.  Sometimes she notes swelling in her right calf, but none recently.  She previously underwent a sleep study in 2017 and was noted to have mild obstructive sleep apnea without any cardiac arrhythmias.  She says that she does snore if she is on her back and therefore, she sleeps on her side.  She typically gets between 6 and 8 hours of sleep at night.  Home Medications    Current Outpatient Medications  Medication Sig Dispense Refill   acebutolol (SECTRAL) 200 MG capsule TAKE 1 CAPSULE BY MOUTH TWICE A DAY (Patient taking  differently: Take 200 mg by mouth 2 (two) times daily.) 180 capsule 0   BOTOX 100 units SOLR injection Inject 100 Units into the muscle every 3 (three) months.     calcium carbonate (TUMS - DOSED IN MG ELEMENTAL CALCIUM) 500 MG chewable tablet Chew 1 tablet by mouth daily as needed for indigestion or heartburn.     Cyanocobalamin (VITAMIN B-12) 500 MCG SUBL Place 1 tablet (500 mcg total) under the tongue once a week. 30 tablet 0   diphenhydrAMINE (BENADRYL) 50 MG tablet Take 50 mg by mouth at bedtime as needed for itching.     docusate sodium (COLACE) 100 MG capsule Take 100 mg by mouth daily as needed for mild constipation.     ferrous sulfate 325 (65 FE) MG tablet Take 325 mg by mouth daily with breakfast.     FIBER ADULT GUMMIES PO Take 1 Piece of gum by mouth daily.     folic acid (FOLVITE) 800 MCG tablet Take 800 mcg by mouth daily. 800 mcg  ondansetron (ZOFRAN ODT) 4 MG disintegrating tablet Take 1 tablet (4 mg total) by mouth every 6 (six) hours as needed for nausea or vomiting. 20 tablet 0   rosuvastatin (CRESTOR) 20 MG tablet TAKE 1 TABLET BY MOUTH EVERY DAY (Patient taking differently: Take 20 mg by mouth daily.) 90 tablet 0   simethicone (MYLICON) 125 MG chewable tablet Chew 125 mg by mouth every 6 (six) hours as needed for flatulence.     No current facility-administered medications for this visit.     Review of Systems    Over the past 5 to 6 weeks, and increased frequency of palpitations which sometimes occur at night and awaken her.  This results in poor sleep and some fatigue and daytime somnolence.  She is also noticed slight increase in dyspnea on exertion.  She denies chest pain, PND, orthopnea, dizziness, syncope, or early satiety.  She sometimes notes right calf swelling, which is chronic.  All other systems reviewed and are otherwise negative except as noted above.    Physical Exam    VS:  BP 110/80 (BP Location: Left Arm)   Pulse 72   Ht 5\' 3"  (1.6 m)   Wt 167 lb  9.6 oz (76 kg)   SpO2 93%   BMI 29.69 kg/m  , BMI Body mass index is 29.69 kg/m.     GEN: Well nourished, well developed, in no acute distress. HEENT: Patient is deaf.  Otherwise normal. Neck: Supple, no JVD, carotid bruits, or masses. Cardiac: RRR, no murmurs, rubs, or gallops. No clubbing, cyanosis, edema.  Radials/PT 2+ and equal bilaterally.  Respiratory:  Respirations regular and unlabored, clear to auscultation bilaterally. GI: Soft, nontender, nondistended, BS + x 4. MS: no deformity or atrophy. Skin: warm and dry, no rash. Neuro:  Strength and sensation are intact. Psych: Normal affect.  Accessory Clinical Findings    ECG personally reviewed by me today -regular sinus rhythm, 71- no acute changes.  Lab Results  Component Value Date   WBC 6.2 02/10/2022   HGB 14.0 02/10/2022   HCT 44.0 02/10/2022   MCV 89.2 02/10/2022   PLT 279 02/10/2022   Lab Results  Component Value Date   CREATININE 0.72 07/26/2021   BUN 10 07/26/2021   NA 138 07/26/2021   K 4.2 07/26/2021   CL 102 07/26/2021   CO2 28 07/26/2021   Lab Results  Component Value Date   ALT 39 (H) 07/26/2021   AST 27 07/26/2021   ALKPHOS 64 07/17/2021   BILITOT 0.5 07/26/2021   Lab Results  Component Value Date   CHOL 130 02/10/2022   HDL 48 (L) 02/10/2022   LDLCALC 61 02/10/2022   TRIG 126 02/10/2022   CHOLHDL 2.7 02/10/2022    Lab Results  Component Value Date   HGBA1C 4.9 01/29/2018    Assessment & Plan    1.  Inappropriate sinus tachycardia/palpitations: Patient with a recent up tick in palpitations over the past 5 to 6 weeks.  In discussing this further, she notes that she has been frequently missing her evening acebutolol dose.  We discussed potentially switching her back to a long-acting beta-blocker however, she prefers to stick with acebutolol and will work on a reminder system to ensure that she is taking her evening dose.  In that setting, will defer any additional monitoring at this time  though will reconsider if symptoms do not improve despite improved compliance.  2.  Dyspnea on exertion: Patient notes some degree of chronic dyspnea exertion but  this seems to have been worse over the past 5 to 6 weeks as well.  She thinks that perhaps she is not sleeping as well because she is sometimes waking up with palpitations.  We discussed potentially pursuing an echocardiogram.  She would like to hold off on any additional evaluation at this time and instead see how she does with improved compliance with beta-blocker therapy.  3.  Daytime somnolence/fatigue: Patient previously evaluated with a sleep study in 2017, which showed mild obstructive sleep apnea.  She has been more tired over the past few weeks though again, she thinks this may be because of palpitations at night.  We discussed possibly pursuing repeat sleep study and she will defer for now.  She plans to talk with her primary care provider about repeat evaluation, if sleep does not improve despite compliance with beta-blocker.  4.  Right calf swelling: This is chronic and intermittent with prior normal lower extremity venous Dopplers.  No swelling today.  No further evaluation at this time.  5.  Disposition: Patient will contact us regarding palpitations and other symptoms following compliance with beta-blocker.  Otherwise, we will plan to follow-up in 6 months or sooner if necessary.  Nicolasa Ducking, NP 07/28/2022, 11:16 AM

## 2022-07-28 NOTE — Patient Instructions (Signed)
Medication Instructions:   NONE  *If you need a refill on your cardiac medications before your next appointment, please call your pharmacy*   Lab Work:  NONE  If you have labs (blood work) drawn today and your tests are completely normal, you will receive your results only by: MyChart Message (if you have MyChart) OR A paper copy in the mail If you have any lab test that is abnormal or we need to change your treatment, we will call you to review the results.   Testing/Procedures:  NONE   Follow-Up: At [facility], you and your health needs are our priority.  As part of our continuing mission to provide you with exceptional heart care, we have created designated Provider Care Teams.  These Care Teams include your primary Cardiologist (physician) and Advanced Practice Providers (APPs -  Physician Assistants and Nurse Practitioners) who all work together to provide you with the care you need, when you need it.  We recommend signing up for the patient portal called "MyChart".  Sign up information is provided on this After Visit Summary.  MyChart is used to connect with patients for Virtual Visits (Telemedicine).  Patients are able to view lab/test results, encounter notes, upcoming appointments, etc.  Non-urgent messages can be sent to your provider as well.   To learn more about what you can do with MyChart, go to [email or web address].    Your next appointment:   6 month(s)  The format for your next appointment:   In Person  Provider:   You may see Lorine Bears, MD or one of the following Advanced Practice Providers on your designated Care Team:     Important Information About Sugar

## 2022-08-03 DIAGNOSIS — M791 Myalgia, unspecified site: Secondary | ICD-10-CM | POA: Diagnosis not present

## 2022-08-03 DIAGNOSIS — M542 Cervicalgia: Secondary | ICD-10-CM | POA: Diagnosis not present

## 2022-08-03 DIAGNOSIS — G43719 Chronic migraine without aura, intractable, without status migrainosus: Secondary | ICD-10-CM | POA: Diagnosis not present

## 2022-08-03 DIAGNOSIS — G518 Other disorders of facial nerve: Secondary | ICD-10-CM | POA: Diagnosis not present

## 2022-08-03 DIAGNOSIS — G43109 Migraine with aura, not intractable, without status migrainosus: Secondary | ICD-10-CM | POA: Diagnosis not present

## 2022-08-08 ENCOUNTER — Ambulatory Visit (HOSPITAL_BASED_OUTPATIENT_CLINIC_OR_DEPARTMENT_OTHER): Payer: Medicare Other | Admitting: Family

## 2022-08-11 NOTE — Progress Notes (Unsigned)
Name: Yesenia Wood   MRN: [id number]    DOB: 05/15/1986   Date:08/12/2022       Progress Note  Subjective  Chief Complaint  Follow Up - she came in with sign language interpreter - Carline   HPI  Headaches and paresthesias: seeing Dr. Neale Burly in [place], she is getting Botox and seems to have improved symptoms   Palpitation: sees Dr. Kirke Corin, her last visit at his office was with his NP Phineas Douglas. She had Echo 11/2019 that showed normal LVEF, CTA 09/2019 calcium score of zero. She had a 48 hour Holter that showed ST and was given Diltiazem but unable to tolerate it, she has been on Metoprolol and was doing well for a while but on her last visit 07/2020 she asked to stop metoprolol and switch to something else to see if it would help her lose weight. She has been on Acebutolol and she states seems to have less side effects.   FMS: she had a flare , seen by Dr. Caralee Ates, had elevated ANA and was referred to Rheumatologist - Dr. Dimple Casey and was given reassurance, [facility] from [facility]. She was  advised to have PT but never got an appointment , she continues to have pain all over and also has balance problems, may need vestibular rehab also   Vitamin D deficiency: improved with supplementation   Dyslipidemia: doing well on Rosuvastatin, last LDl was 61, HDL improved from 27 to 48   MDD: she has not seen  Dr. Elna Breslow in a while but has been meeting with clinical social worker - Willodean Rosenthal, advised to follow up with Dr. Jorja Loa . Currently not on medication since it did not help in the past   Moderate asthma: she was seen by pulmonologist, does not have medications at home, she states none of the medications helped She continues to have intermittent sob, wheezing and cough. She is allergic to singulair   Sun sensitivity: she states her skin burns when in the sun, but no rashes or redness. It feels like it is on fire    Patient Active Problem List   Diagnosis Date Noted   Dyslipidemia 02/10/2022    Vitamin D deficiency 02/10/2022   Iron deficiency anemia 02/10/2022   Positive ANA (antinuclear antibody) 12/20/2021   Fibromyalgia 12/20/2021   Shoulder pain 07/26/2021   Sleep difficulties 04/30/2020   Medication nonadherence due to intolerance 12/31/2019   Numbness 12/26/2019   Tingling 12/26/2019   RLS (restless legs syndrome) 09/30/2019   MDD (major depressive disorder), recurrent episode, moderate (HCC) 07/17/2019   Social anxiety disorder 07/17/2019   Muteness 09/10/2018   OSA (obstructive sleep apnea) 10/10/2016   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   Dissocial personality disorder (HCC) 06/07/2016   Sleep disturbance 05/10/2016   Chronic migraine without aura 03/29/2016   Pain in the chest 06/09/2015   Palpitations 06/09/2015   Deaf 05/16/2015   B12 deficiency 05/16/2015   Cochlear implant status 05/16/2015   Fatty infiltration of liver 05/16/2015   Gastro-esophageal reflux disease without esophagitis 05/16/2015   Generalized headache 05/16/2015   Right-sided Bell's palsy 05/16/2015   H/O: depression 05/16/2015   Hypertriglyceridemia 05/16/2015   Irregular menstrual cycle 05/16/2015   Overweight 05/16/2015   Paresthesia 05/16/2015   Disorder of labyrinth 05/16/2015   Dyspnea 10/07/2014   Moderate persistent intrinsic asthma without status asthmaticus without complication 10/07/2014   Migraine 08/05/2013   Allergic rhinitis 07/01/2013   Obesity (BMI 30-39.9) 07/01/2013   Bartholin gland cyst  09/16/2008    Past Surgical History:  Procedure Laterality Date   BARTHOLIN GLAND CYST REMOVAL     COCHLEAR IMPLANT     COCHLEAR IMPLANT REMOVAL     DILATATION AND CURETTAGE/HYSTEROSCOPY WITH MINERVA N/A 03/05/2021   Procedure: DILATATION AND CURETTAGE/HYSTEROSCOPY WITH MINERVA;  Surgeon: Hildred Laser, MD;  Location: [facility] ORS;  Service: Gynecology;  Laterality: N/A;   LAPAROSCOPIC TUBAL LIGATION Bilateral 03/26/2018   Procedure: LAPAROSCOPIC TUBAL LIGATION;   Surgeon: Hildred Laser, MD;  Location: [facility] OR[facility];  Service: Gynecology;  Laterality: Bilateral;  with peritoneal biopsies   LAPAROSCOPY     MYRINGOTOMY WITH TUBE PLACEMENT     TUBAL LIGATION  03/26/2018    Family History  Adopted: Yes  Family history unknown: Yes    Social History   Tobacco Use   Smoking status: Former    Packs/day: 1.00    Years: 2.00    Total pack years: 2.00    Types: Cigarettes    Start date: 11/28/2004    Quit date: 11/28/2006    Years since quitting: 15.7   Smokeless tobacco: Never  Substance Use Topics   Alcohol use: Yes    Comment: 1 every 3 months     Current Outpatient Medications:    acebutolol (SECTRAL) 200 MG capsule, TAKE 1 CAPSULE BY MOUTH TWICE A DAY, Disp: 180 capsule, Rfl: 0   BOTOX 100 units SOLR injection, Inject 100 Units into the muscle every 3 (three) months., Disp: , Rfl:    calcium carbonate (TUMS - DOSED IN MG ELEMENTAL CALCIUM) 500 MG chewable tablet, Chew 1 tablet by mouth daily as needed for indigestion or heartburn., Disp: , Rfl:    Cholecalciferol (VITAMIN D) 50 MCG (2000 UT) CAPS, Take 1 capsule by mouth daily., Disp: , Rfl:    Cyanocobalamin (VITAMIN B-12) 500 MCG SUBL, Place 1 tablet (500 mcg total) under the tongue once a week., Disp: 30 tablet, Rfl: 0   diphenhydrAMINE (BENADRYL) 50 MG tablet, Take 50 mg by mouth at bedtime as needed for itching., Disp: , Rfl:    docusate sodium (COLACE) 100 MG capsule, Take 100 mg by mouth daily as needed for mild constipation., Disp: , Rfl:    ferrous sulfate 325 (65 FE) MG tablet, Take 325 mg by mouth daily with breakfast., Disp: , Rfl:    FIBER ADULT GUMMIES PO, Take 15 mg by mouth daily., Disp: , Rfl:    folic acid (FOLVITE) 800 MCG tablet, Take 800 mcg by mouth daily. 800 mcg, Disp: , Rfl:    ondansetron (ZOFRAN ODT) 4 MG disintegrating tablet, Take 1 tablet (4 mg total) by mouth every 6 (six) hours as needed for nausea or vomiting., Disp: 20 tablet, Rfl: 0   rosuvastatin (CRESTOR) 20 MG  tablet, TAKE 1 TABLET BY MOUTH EVERY DAY, Disp: 90 tablet, Rfl: 0   simethicone (MYLICON) 125 MG chewable tablet, Chew 125 mg by mouth every 6 (six) hours as needed for flatulence., Disp: , Rfl:   Allergies  Allergen Reactions   Budesonide-Formoterol Fumarate Anaphylaxis and Other (See Comments)    Other reaction(s): Other (See Comments) chest pain Chest pain chest pain   Astelin [Azelastine]     Tingling, numbness, nausea   Gabapentin Nausea And Vomiting   Nortriptyline Other (See Comments)    Other reaction(s): Other (See Comments)   Lamictal [Lamotrigine]     Self injurious thoughts   Milnacipran Other (See Comments)    Pressure in eyes   Tegaderm Ag Mesh [Silver] Itching  Diclofenac Other (See Comments)    Other reaction(s): Other (See Comments)   Duloxetine Hcl Other (See Comments)    Other reaction(s): Other (See Comments) Other reaction(s): unknown   Montelukast Sodium Other (See Comments)    Other reaction(s): wheezing/sob   Mupirocin Other (See Comments)   Naproxen Other (See Comments) and Rash    Other reaction(s): rash/itching   Naproxen Sodium Rash    mild rash   Other Rash   Oxycodone Hcl Rash   Sulfa Antibiotics Hives, Rash and Itching    I personally reviewed active problem list, medication list, allergies, family history, social history, health maintenance with the patient/caregiver today.   ROS  Constitutional: Negative for fever or weight change.  Respiratory: positive  for cough and shortness of breath.   Cardiovascular: Negative for chest pain or palpitations.  Gastrointestinal: Negative for abdominal pain, no bowel changes.  Musculoskeletal: Negative for gait problem or joint swelling.  Skin: Negative for rash.  Neurological: positive  for dizziness and intermittent  headache.  No other specific complaints in a complete review of systems (except as listed in HPI above).   Objective  Vitals:   08/12/22 0952  BP: 112/68  Pulse: 86  Resp: 16   SpO2: 98%  Weight: 168 lb (76.2 kg)  Height: 5\' 3"  (1.6 m)    Body mass index is 29.76 kg/m.  Physical Exam  Constitutional: Patient appears well-developed and well-nourished.  No distress.  HEENT: head atraumatic, normocephalic, pupils equal and reactive to light, neck supple, throat within normal limits Cardiovascular: Normal rate, regular rhythm and normal heart sounds.  No murmur heard. No BLE edema. Pulmonary/Chest: Effort normal and breath sounds normal. No respiratory distress. Abdominal: Soft.  There is no tenderness. Psychiatric: Patient has a normal mood and affect. behavior is normal. Judgment and thought content normal.    PHQ2/9:    08/12/2022    9:51 AM 06/28/2022    1:54 PM 03/08/2022   11:29 AM 02/10/2022   11:19 AM 02/10/2022   10:58 AM  Depression screen PHQ 2/9  Decreased Interest 1   2 2   Down, Depressed, Hopeless 1   1 1   PHQ - 2 Score 2   3 3   Altered sleeping 3   3 3   Tired, decreased energy 3   2 2   Change in appetite 0   1 1  Feeling bad or failure about yourself  0   1 1  Trouble concentrating 1   2 2   Moving slowly or fidgety/restless 0   2 2  Suicidal thoughts 0   0 0  PHQ-9 Score 9   14 14   Difficult doing work/chores    Somewhat difficult Somewhat difficult     Information is confidential and restricted. Go to Review Flowsheets to unlock data.    phq 9 is positive   Fall Risk:    08/12/2022    9:51 AM 02/10/2022   11:19 AM 02/10/2022   11:04 AM 10/28/2021   10:45 AM 07/26/2021    2:25 PM  Fall Risk   Falls in the past year? 1 1 1 1 1   Number falls in past yr: 0 0 1 1 1   Injury with Fall? 0 0 0 0 0  Risk for fall due to : No Fall Risks  History of fall(s) Impaired balance/gait;Impaired mobility;Other (Comment) Impaired balance/gait  Follow up Falls prevention discussed  Falls prevention discussed        Functional Status Survey: Is the patient deaf  or have difficulty hearing?: Yes Does the patient have difficulty seeing, even when  wearing glasses/contacts?: Yes Does the patient have difficulty concentrating, remembering, or making decisions?: Yes Does the patient have difficulty walking or climbing stairs?: Yes Does the patient have difficulty dressing or bathing?: No Does the patient have difficulty doing errands alone such as visiting a doctor's office or shopping?: No    Assessment & Plan  1. Fibromyalgia  Referral placed for PT again  2. Mild persistent asthma without complication  Cannot tolerate medications  3. Need for immunization against influenza  - Flu Vaccine QUAD 6+ mos PF IM (Fluarix Quad PF)  4. Disorder of labyrinth of both ears   5. Dyslipidemia  - rosuvastatin (CRESTOR) 20 MG tablet; Take 1 tablet (20 mg total) by mouth daily.  Dispense: 90 tablet; Refill: 1  6. B12 deficiency  Taking supplementation   7. Vitamin D deficiency   8. Chronic migraine without aura without status migrainosus, not intractable   9. MDD (major depressive disorder), recurrent episode, moderate (HCC)  She needs to go back to psychiatrist   10. Chronic idiopathic constipation  - polyethylene glycol powder (GLYCOLAX/MIRALAX) 17 GM/SCOOP powder; Take 17 g by mouth daily.  Dispense: 3350 g; Refill: 1  11. Gastro-esophageal reflux disease without esophagitis  - famotidine (PEPCID) 40 MG tablet; Take 1 tablet (40 mg total) by mouth daily. For reflux and nausea  in am's  Dispense: 90 tablet; Refill: 1

## 2022-08-12 ENCOUNTER — Encounter: Payer: Self-pay | Admitting: Family Medicine

## 2022-08-12 ENCOUNTER — Ambulatory Visit (INDEPENDENT_AMBULATORY_CARE_PROVIDER_SITE_OTHER): Payer: Medicare Other | Admitting: Family Medicine

## 2022-08-12 ENCOUNTER — Other Ambulatory Visit: Payer: Self-pay | Admitting: Family Medicine

## 2022-08-12 VITALS — BP 112/68 | HR 86 | Resp 16 | Ht 63.0 in | Wt 168.0 lb

## 2022-08-12 DIAGNOSIS — G43709 Chronic migraine without aura, not intractable, without status migrainosus: Secondary | ICD-10-CM | POA: Diagnosis not present

## 2022-08-12 DIAGNOSIS — E785 Hyperlipidemia, unspecified: Secondary | ICD-10-CM

## 2022-08-12 DIAGNOSIS — R110 Nausea: Secondary | ICD-10-CM | POA: Insufficient documentation

## 2022-08-12 DIAGNOSIS — E538 Deficiency of other specified B group vitamins: Secondary | ICD-10-CM | POA: Diagnosis not present

## 2022-08-12 DIAGNOSIS — M797 Fibromyalgia: Secondary | ICD-10-CM | POA: Diagnosis not present

## 2022-08-12 DIAGNOSIS — E559 Vitamin D deficiency, unspecified: Secondary | ICD-10-CM

## 2022-08-12 DIAGNOSIS — H8193 Unspecified disorder of vestibular function, bilateral: Secondary | ICD-10-CM | POA: Diagnosis not present

## 2022-08-12 DIAGNOSIS — K219 Gastro-esophageal reflux disease without esophagitis: Secondary | ICD-10-CM | POA: Diagnosis not present

## 2022-08-12 DIAGNOSIS — F331 Major depressive disorder, recurrent, moderate: Secondary | ICD-10-CM | POA: Diagnosis not present

## 2022-08-12 DIAGNOSIS — Z23 Encounter for immunization: Secondary | ICD-10-CM

## 2022-08-12 DIAGNOSIS — H8190 Unspecified disorder of vestibular function, unspecified ear: Secondary | ICD-10-CM

## 2022-08-12 DIAGNOSIS — J4530 Mild persistent asthma, uncomplicated: Secondary | ICD-10-CM

## 2022-08-12 DIAGNOSIS — K5904 Chronic idiopathic constipation: Secondary | ICD-10-CM | POA: Diagnosis not present

## 2022-08-12 MED ORDER — FAMOTIDINE 40 MG PO TABS: 40.0000 mg | ORAL_TABLET | Freq: Every day | ORAL | 1 refills | Status: DC

## 2022-08-12 MED ORDER — ROSUVASTATIN CALCIUM 20 MG PO TABS: 20.0000 mg | ORAL_TABLET | Freq: Every day | ORAL | 1 refills | Status: DC

## 2022-08-12 MED ORDER — POLYETHYLENE GLYCOL 3350 17 GM/SCOOP PO POWD: 17.0000 g | Freq: Every day | ORAL | 1 refills | Status: DC

## 2022-08-12 NOTE — Patient Instructions (Signed)
Go to [employer] store in [place] and get long sleeve shirts/swimming or fishing to protect skin from the sun without making you hot

## 2022-08-16 ENCOUNTER — Ambulatory Visit (INDEPENDENT_AMBULATORY_CARE_PROVIDER_SITE_OTHER): Payer: Medicare Other | Admitting: Licensed Clinical Social Worker

## 2022-08-16 DIAGNOSIS — F331 Major depressive disorder, recurrent, moderate: Secondary | ICD-10-CM | POA: Diagnosis not present

## 2022-08-16 DIAGNOSIS — F4010 Social phobia, unspecified: Secondary | ICD-10-CM

## 2022-08-16 NOTE — Plan of Care (Signed)
  Problem: Decrease depressive symptoms and improve levels of effective functioning Goal: LTG: Reduce frequency, intensity, and duration of depression symptoms as evidenced by: pt self report Outcome: Progressing Goal: STG: Yesenia Wood WILL PARTICIPATE IN AT LEAST 80% OF SCHEDULED INDIVIDUAL PSYCHOTHERAPY SESSIONS Outcome: Progressing Intervention: REVIEW PLEASE SKILLS (TREAT PHYSICAL ILLNESS, BALANCE EATING, AVOID MOOD-ALTERING SUBSTANCES, BALANCE SLEEP AND GET EXERCISE) WITH Yesenia Wood Note: Reviewed    Problem: Reduce overall frequency, intensity, and duration of the anxiety so that daily functioning is not impaired. Goal: LTG: Patient will score less than 5 on the Generalized Anxiety Disorder 7 Scale (GAD-7) Outcome: Progressing Intervention: Assist with relaxation techniques, as appropriate (deep breathing exercises, meditation, guided imagery) Description: encouraged Note: Reviewed  Intervention: Encourage new environment or opportunities for social interaction Description: Encouraged--pt has been seeking out events Note: Reviewed and encouraged

## 2022-08-16 NOTE — Progress Notes (Signed)
Virtual Visit via Audio Note  I connected with Yesenia Wood on 08/16/22 at 11:00 AM EDT by an audio enabled telemedicine application and verified that I am speaking with the correct person using two identifiers.Sign language interpreter was on audio line for assistance.   Location: Patient: home Provider: remote office [place], [place])   I discussed the limitations of evaluation and management by telemedicine and the availability of in person appointments. The patient expressed understanding and agreed to proceed.   I discussed the assessment and treatment plan with the patient. The patient was provided an opportunity to ask questions and all were answered. The patient agreed with the plan and demonstrated an understanding of the instructions.   The patient was advised to call back or seek an in-person evaluation if the symptoms worsen or if the condition fails to improve as anticipated.  I provided 45 minutes of non-face-to-face time during this encounter.   [place], LCSW   THERAPIST PROGRESS NOTE  Session Time: 75-6433I  Participation Level: Active  Behavioral Response: NAAlertAnxious  Type of Therapy: Individual Therapy  Treatment Goals addressed: Problem: Decrease depressive symptoms and improve levels of effective functioning Goal: LTG: Reduce frequency, intensity, and duration of depression symptoms as evidenced by: pt self report Outcome: Progressing Goal: STG: Kaydance WILL PARTICIPATE IN AT LEAST 80% OF SCHEDULED INDIVIDUAL PSYCHOTHERAPY SESSIONS Outcome: Progressing Intervention: REVIEW PLEASE SKILLS (TREAT PHYSICAL ILLNESS, BALANCE EATING, AVOID MOOD-ALTERING SUBSTANCES, BALANCE SLEEP AND GET EXERCISE) WITH Margarita Grizzle Note: Reviewed    Problem: Reduce overall frequency, intensity, and duration of the anxiety so that daily functioning is not impaired. Goal: LTG: Patient will score less than 5 on the Generalized Anxiety Disorder 7 Scale (GAD-7) Outcome:  Progressing Intervention: Assist with relaxation techniques, as appropriate (deep breathing exercises, meditation, guided imagery) Description: encouraged Note: Reviewed  Intervention: Encourage new environment or opportunities for social interaction Description: Encouraged--pt has been seeking out events Note: Reviewed and encouraged    Interventions: CBT; supportive; narrative   Summary: Yesenia Wood is a 36 y.o. female who presents with stable symptoms related to depression and social anxiety diagnoses. Pt reports that mood is stable and that she has not had any panic episodes recently.   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  Pt feels that she had a reason to get up and get moving and it was motivating.   Pt reports that she has been reading a lot and enjoys going to the bookstore to pick out new books. Pt reports that's really the only place outside of her home where she feels comfortable. Encouraged pt to go as frequently as possible.   Reviewed coping skills that pt uses when she feels depressed or anxious. Pt reports that she enjoys riding her stationary bike as her primary form of activity throughout the day and that it helps manage depression symptoms.   Allowed pt to do visualization work: wellness in the next 10 years. Discussed ways that pt can create new hobbies, cook different things, and get engaged in different interests during the visualization.   Continued recommendations are as follows: self care behaviors, positive social engagements, focusing on overall work/home/life balance, and focusing on positive physical and emotional wellness.   Suicidal/Homicidal: No  Therapist Response: Pt is continuing to apply interventions learned in session into daily life situations. Personal growth and progress is continuing at time of session.Treatment to continue as indicated.   Plan: Return  again in 4  weeks.  Diagnosis:  Encounter Diagnoses  Name Primary?   MDD (major depressive disorder), recurrent episode, moderate (HCC) Yes   Social anxiety disorder     Collaboration of Care: Other pt requested referral back to psychiatrist of record, Dr. Jomarie Longs  Patient/Guardian was advised Release of Information must be obtained prior to any record release in order to collaborate their care with an outside provider. Patient/Guardian was advised if they have not already done so to contact the registration department to sign all necessary forms in order for [place] to release information regarding their care.   Consent: Patient/Guardian gives verbal consent for treatment and assignment of benefits for services provided during this visit. Patient/Guardian expressed understanding and agreed to proceed.    Ernest Haber [place] Fant, LCSW 08/16/2022

## 2022-08-18 ENCOUNTER — Encounter: Payer: Self-pay | Admitting: Family Medicine

## 2022-08-19 ENCOUNTER — Ambulatory Visit (INDEPENDENT_AMBULATORY_CARE_PROVIDER_SITE_OTHER): Payer: Medicare Other | Admitting: Family Medicine

## 2022-08-19 ENCOUNTER — Encounter: Payer: Self-pay | Admitting: Family Medicine

## 2022-08-19 VITALS — BP 118/72 | HR 84 | Resp 16 | Ht 63.0 in | Wt 168.0 lb

## 2022-08-19 DIAGNOSIS — R21 Rash and other nonspecific skin eruption: Secondary | ICD-10-CM

## 2022-08-19 DIAGNOSIS — T8069XA Other serum reaction due to other serum, initial encounter: Secondary | ICD-10-CM | POA: Diagnosis not present

## 2022-08-19 DIAGNOSIS — L299 Pruritus, unspecified: Secondary | ICD-10-CM

## 2022-08-19 MED ORDER — HYDROXYZINE PAMOATE 25 MG PO CAPS: 25.0000 mg | ORAL_CAPSULE | Freq: Three times a day (TID) | ORAL | 0 refills | Status: DC | PRN

## 2022-08-19 MED ORDER — LORATADINE 10 MG PO TABS: 10.0000 mg | ORAL_TABLET | Freq: Every day | ORAL | 0 refills | Status: DC

## 2022-08-19 MED ORDER — TRIAMCINOLONE ACETONIDE 0.1 % EX CREA: 1.0000 | TOPICAL_CREAM | Freq: Two times a day (BID) | CUTANEOUS | 0 refills | Status: DC

## 2022-08-19 NOTE — Progress Notes (Signed)
Name: Yesenia Wood   MRN: [id number]    DOB: July 28, 1986   Date:08/19/2022       Progress Note  Subjective  Chief Complaint  Possible Flu Vaccine Allergy  It was an acute visit and sign language interpreter not able to be scheduled. Mother came with her, we also used app on her phone and written notes   HPI  Reaction to flu vaccine: patient had flu vaccine on 09/15 and within hours developed a localized rash and swelling ( usually happens on her) however the following day rash started to spread all over her trunk, arms and legs. It is very itchy, getting progressively worse. She has not started new medication yet. No change in hygiene products, denies sob, wheezing .   [employer] form faxed 08/19/22 to report adverse event  Patient Active Problem List   Diagnosis Date Noted   Nausea 08/12/2022   Dyslipidemia 02/10/2022   Vitamin D deficiency 02/10/2022   Positive ANA (antinuclear antibody) 12/20/2021   Fibromyalgia 12/20/2021   Sleep difficulties 04/30/2020   RLS (restless legs syndrome) 09/30/2019   MDD (major depressive disorder), recurrent episode, moderate (HCC) 07/17/2019   Social anxiety disorder 07/17/2019   Muteness 09/10/2018   OSA (obstructive sleep apnea) 10/10/2016   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   Dissocial personality disorder (HCC) 06/07/2016   Sleep disturbance 05/10/2016   Chronic migraine without aura 03/29/2016   Palpitations 06/09/2015   Deaf 05/16/2015   B12 deficiency 05/16/2015   Cochlear implant status 05/16/2015   Fatty infiltration of liver 05/16/2015   Gastro-esophageal reflux disease without esophagitis 05/16/2015   Generalized headache 05/16/2015   Right-sided Bell's palsy 05/16/2015   Hypertriglyceridemia 05/16/2015   Irregular menstrual cycle 05/16/2015   Overweight 05/16/2015   Paresthesia 05/16/2015   Disorder of labyrinth 05/16/2015   Migraine 08/05/2013   Allergic rhinitis 07/01/2013   Obesity (BMI 30-39.9) 07/01/2013    Bartholin gland cyst 09/16/2008    Past Surgical History:  Procedure Laterality Date   BARTHOLIN GLAND CYST REMOVAL     COCHLEAR IMPLANT     COCHLEAR IMPLANT REMOVAL     DILATATION AND CURETTAGE/HYSTEROSCOPY WITH MINERVA N/A 03/05/2021   Procedure: DILATATION AND CURETTAGE/HYSTEROSCOPY WITH MINERVA;  Surgeon: Hildred Laser, MD;  Location: [facility];  Service: Gynecology;  Laterality: N/A;   LAPAROSCOPIC TUBAL LIGATION Bilateral 03/26/2018   Procedure: LAPAROSCOPIC TUBAL LIGATION;  Surgeon: Hildred Laser, MD;  Location: [facility];  Service: Gynecology;  Laterality: Bilateral;  with peritoneal biopsies   LAPAROSCOPY     MYRINGOTOMY WITH TUBE PLACEMENT     TUBAL LIGATION  03/26/2018    Family History  Adopted: Yes  Family history unknown: Yes    Social History   Tobacco Use   Smoking status: Former    Packs/day: 1.00    Years: 2.00    Total pack years: 2.00    Types: Cigarettes    Start date: 11/28/2004    Quit date: 11/28/2006    Years since quitting: 15.7   Smokeless tobacco: Never  Substance Use Topics   Alcohol use: Yes    Comment: 1 every 3 months     Current Outpatient Medications:    acebutolol (SECTRAL) 200 MG capsule, TAKE 1 CAPSULE BY MOUTH TWICE A DAY, Disp: 180 capsule, Rfl: 0   BOTOX 100 units SOLR injection, Inject 100 Units into the muscle every 3 (three) months., Disp: , Rfl:    calcium carbonate (TUMS - DOSED IN MG ELEMENTAL CALCIUM) 500 MG chewable tablet,  Chew 1 tablet by mouth daily as needed for indigestion or heartburn., Disp: , Rfl:    Cholecalciferol (VITAMIN D) 50 MCG (2000 UT) CAPS, Take 1 capsule by mouth daily., Disp: , Rfl:    Cyanocobalamin (VITAMIN B-12) 500 MCG SUBL, Place 1 tablet (500 mcg total) under the tongue once a week., Disp: 30 tablet, Rfl: 0   diphenhydrAMINE (BENADRYL) 50 MG tablet, Take 50 mg by mouth at bedtime as needed for itching., Disp: , Rfl:    docusate sodium (COLACE) 100 MG capsule, Take 100 mg by mouth daily as needed for mild  constipation., Disp: , Rfl:    famotidine (PEPCID) 40 MG tablet, Take 1 tablet (40 mg total) by mouth daily. For reflux and nausea  in am's, Disp: 90 tablet, Rfl: 1   ferrous sulfate 325 (65 FE) MG tablet, Take 325 mg by mouth daily with breakfast., Disp: , Rfl:    FIBER ADULT GUMMIES PO, Take 15 mg by mouth daily., Disp: , Rfl:    folic acid (FOLVITE) 800 MCG tablet, Take 800 mcg by mouth daily. 800 mcg, Disp: , Rfl:    ondansetron (ZOFRAN ODT) 4 MG disintegrating tablet, Take 1 tablet (4 mg total) by mouth every 6 (six) hours as needed for nausea or vomiting., Disp: 20 tablet, Rfl: 0   polyethylene glycol powder (GLYCOLAX/MIRALAX) 17 GM/SCOOP powder, Take 17 g by mouth daily., Disp: 3350 g, Rfl: 1   rosuvastatin (CRESTOR) 20 MG tablet, Take 1 tablet (20 mg total) by mouth daily., Disp: 90 tablet, Rfl: 1   simethicone (MYLICON) 125 MG chewable tablet, Chew 125 mg by mouth every 6 (six) hours as needed for flatulence., Disp: , Rfl:   Allergies  Allergen Reactions   Budesonide-Formoterol Fumarate Anaphylaxis and Other (See Comments)    Other reaction(s): Other (See Comments) chest pain Chest pain chest pain   Astelin [Azelastine]     Tingling, numbness, nausea   Gabapentin Nausea And Vomiting   Nortriptyline Other (See Comments)    Other reaction(s): Other (See Comments)   Lamictal [Lamotrigine]     Self injurious thoughts   Milnacipran Other (See Comments)    Pressure in eyes   Tegaderm Ag Mesh [Silver] Itching   Diclofenac Other (See Comments)    Other reaction(s): Other (See Comments)   Duloxetine Hcl Other (See Comments)    Other reaction(s): Other (See Comments) Other reaction(s): unknown   Montelukast Sodium Other (See Comments)    Other reaction(s): wheezing/sob   Mupirocin Other (See Comments)   Naproxen Other (See Comments) and Rash    Other reaction(s): rash/itching   Naproxen Sodium Rash    mild rash   Other Rash   Oxycodone Hcl Rash   Sulfa Antibiotics Hives, Rash  and Itching    I personally reviewed active problem list, medication list, allergies, family history, social history, health maintenance with the patient/caregiver today.   ROS  Ten systems reviewed and is negative except as mentioned in HPI   Objective  Vitals:   08/19/22 1123  BP: 118/72  Pulse: 84  Resp: 16  SpO2: 98%  Weight: 168 lb (76.2 kg)  Height: 5\' 3"  (1.6 m)    Body mass index is 29.76 kg/m.  Physical Exam  Constitutional: Patient appears well-developed and well-nourished.  No distress.  HEENT: head atraumatic, normocephalic, pupils equal and reactive to light, neck supple Cardiovascular: Normal rate, regular rhythm and normal heart sounds.  No murmur heard. No BLE edema. Pulmonary/Chest: Effort normal and breath sounds normal.  No respiratory distress. Abdominal: Soft.  There is no tenderness. Skin; erythematous rash - see attached photos  Psychiatric: Patient has a normal mood and affect. behavior is normal. Judgment and thought content normal.   PHQ2/9:    08/19/2022   11:22 AM 08/12/2022    9:51 AM 06/28/2022    1:54 PM 03/08/2022   11:29 AM 02/10/2022   11:19 AM  Depression screen PHQ 2/9  Decreased Interest 1 1   2   Down, Depressed, Hopeless 1 1   1   PHQ - 2 Score 2 2   3   Altered sleeping 3 3   3   Tired, decreased energy 3 3   2   Change in appetite 0 0   1  Feeling bad or failure about yourself  0 0   1  Trouble concentrating 1 1   2   Moving slowly or fidgety/restless 0 0   2  Suicidal thoughts 0 0   0  PHQ-9 Score 9 9   14   Difficult doing work/chores     Somewhat difficult     Information is confidential and restricted. Go to Review Flowsheets to unlock data.    phq 9 is positive   Fall Risk:    08/19/2022   11:22 AM 08/12/2022    9:51 AM 02/10/2022   11:19 AM 02/10/2022   11:04 AM 10/28/2021   10:45 AM  Fall Risk   Falls in the past year? 1 1 1 1 1   Number falls in past yr: 0 0 0 1 1  Injury with Fall? 0 0 0 0 0  Risk for fall due to  : No Fall Risks No Fall Risks  History of fall(s) Impaired balance/gait;Impaired mobility;Other (Comment)  Follow up Falls prevention discussed Falls prevention discussed  Falls prevention discussed       Functional Status Survey: Is the patient deaf or have difficulty hearing?: Yes Does the patient have difficulty seeing, even when wearing glasses/contacts?: Yes Does the patient have difficulty concentrating, remembering, or making decisions?: Yes Does the patient have difficulty walking or climbing stairs?: Yes Does the patient have difficulty dressing or bathing?: No Does the patient have difficulty doing errands alone such as visiting a doctor's office or shopping?: No    Assessment & Plan  1. Rash in adult  - hydrOXYzine (VISTARIL) 25 MG capsule; Take 1-2 capsules (25-50 mg total) by mouth every 8 (eight) hours as needed.  Dispense: 30 capsule; Refill: 0 - loratadine (CLARITIN) 10 MG tablet; Take 1 tablet (10 mg total) by mouth daily.  Dispense: 30 tablet; Refill: 0 - triamcinolone cream (KENALOG) 0.1 %; Apply 1 Application topically 2 (two) times daily.  Dispense: 453.6 g; Refill: 0  2. Allergic reaction to vaccine   3. Pruritus  - hydrOXYzine (VISTARIL) 25 MG capsule; Take 1-2 capsules (25-50 mg total) by mouth every 8 (eight) hours as needed.  Dispense: 30 capsule; Refill: 0 - loratadine (CLARITIN) 10 MG tablet; Take 1 tablet (10 mg total) by mouth daily.  Dispense: 30 tablet; Refill: 0 .

## 2022-08-19 NOTE — Patient Instructions (Addendum)
Loratadine take it twice daily  Hydroxizine every 8 hours in place of Benadryl may take 2 at night Mix cream with hypo-allergic lotion - avoid face, groin, axilla and breast

## 2022-08-28 ENCOUNTER — Encounter: Payer: Self-pay | Admitting: Family Medicine

## 2022-09-08 ENCOUNTER — Encounter: Payer: Self-pay | Admitting: Family Medicine

## 2022-09-10 ENCOUNTER — Other Ambulatory Visit: Payer: Self-pay | Admitting: Family Medicine

## 2022-09-10 DIAGNOSIS — R21 Rash and other nonspecific skin eruption: Secondary | ICD-10-CM

## 2022-09-10 DIAGNOSIS — L299 Pruritus, unspecified: Secondary | ICD-10-CM

## 2022-09-12 ENCOUNTER — Other Ambulatory Visit: Payer: Self-pay | Admitting: Family Medicine

## 2022-09-12 DIAGNOSIS — R21 Rash and other nonspecific skin eruption: Secondary | ICD-10-CM

## 2022-09-12 DIAGNOSIS — L299 Pruritus, unspecified: Secondary | ICD-10-CM

## 2022-09-14 DIAGNOSIS — G518 Other disorders of facial nerve: Secondary | ICD-10-CM | POA: Diagnosis not present

## 2022-09-14 DIAGNOSIS — G43109 Migraine with aura, not intractable, without status migrainosus: Secondary | ICD-10-CM | POA: Diagnosis not present

## 2022-09-14 DIAGNOSIS — M791 Myalgia, unspecified site: Secondary | ICD-10-CM | POA: Diagnosis not present

## 2022-09-14 DIAGNOSIS — M542 Cervicalgia: Secondary | ICD-10-CM | POA: Diagnosis not present

## 2022-09-19 DIAGNOSIS — Z23 Encounter for immunization: Secondary | ICD-10-CM | POA: Diagnosis not present

## 2022-09-26 ENCOUNTER — Encounter: Payer: Self-pay | Admitting: Cardiovascular Disease

## 2022-09-28 ENCOUNTER — Ambulatory Visit: Payer: Medicare Other | Attending: Nurse Practitioner

## 2022-09-28 ENCOUNTER — Other Ambulatory Visit: Payer: Self-pay | Admitting: *Deleted

## 2022-09-28 DIAGNOSIS — R002 Palpitations: Secondary | ICD-10-CM

## 2022-09-28 DIAGNOSIS — R0609 Other forms of dyspnea: Secondary | ICD-10-CM

## 2022-09-28 MED ORDER — METOPROLOL SUCCINATE ER 200 MG PO TB24: ORAL_TABLET | ORAL | 6 refills | Status: DC

## 2022-09-28 NOTE — Progress Notes (Signed)
Metoprolol succinate 200 mg once daily Echo Zio XT -14 days

## 2022-09-29 ENCOUNTER — Other Ambulatory Visit: Payer: Medicare Other

## 2022-09-30 DIAGNOSIS — R002 Palpitations: Secondary | ICD-10-CM

## 2022-10-04 ENCOUNTER — Telehealth: Payer: Self-pay | Admitting: Cardiovascular Disease

## 2022-10-04 NOTE — Telephone Encounter (Signed)
Sent MyChart message asking for more info.

## 2022-10-04 NOTE — Telephone Encounter (Signed)
Appointment scheduled for echocardiogram. Closing encounter.

## 2022-10-04 NOTE — Telephone Encounter (Signed)
Pt c/o medication issue:  1. Name of Medication:   metoprolol (TOPROL-XL) 200 MG 24 hr tablet    2. How are you currently taking this medication (dosage and times per day)? As prescribed   3. Are you having a reaction (difficulty breathing--STAT)?   4. What is your medication issue? Normal BP:105/80 This morning BP: 95/70   Pt is requesting a MyChart message as correspondence due to hearing impairment and her phone not being able to receive calls.

## 2022-10-10 ENCOUNTER — Encounter: Payer: Self-pay | Admitting: Cardiovascular Disease

## 2022-10-13 DIAGNOSIS — M25551 Pain in right hip: Secondary | ICD-10-CM | POA: Diagnosis not present

## 2022-10-13 DIAGNOSIS — M797 Fibromyalgia: Secondary | ICD-10-CM | POA: Diagnosis not present

## 2022-10-13 DIAGNOSIS — M25552 Pain in left hip: Secondary | ICD-10-CM | POA: Diagnosis not present

## 2022-10-17 MED ORDER — METOPROLOL SUCCINATE ER 100 MG PO TB24: 100.0000 mg | ORAL_TABLET | Freq: Every day | ORAL | 3 refills | Status: DC

## 2022-10-17 NOTE — Telephone Encounter (Signed)
Pt made aware of MD's recommendations to decrease Metoprolol to 100 mg via mychart. Nurse also d/c and removed acebutolol from medication list as records show it was recommended to stop on 10/31 by Eula Listen, PA.

## 2022-10-18 DIAGNOSIS — M25551 Pain in right hip: Secondary | ICD-10-CM | POA: Diagnosis not present

## 2022-10-18 DIAGNOSIS — M25552 Pain in left hip: Secondary | ICD-10-CM | POA: Diagnosis not present

## 2022-10-18 DIAGNOSIS — M797 Fibromyalgia: Secondary | ICD-10-CM | POA: Diagnosis not present

## 2022-10-23 ENCOUNTER — Other Ambulatory Visit: Payer: Self-pay | Admitting: Cardiology

## 2022-10-24 ENCOUNTER — Ambulatory Visit (INDEPENDENT_AMBULATORY_CARE_PROVIDER_SITE_OTHER): Payer: Medicare Other | Admitting: Licensed Clinical Social Worker

## 2022-10-24 DIAGNOSIS — F4010 Social phobia, unspecified: Secondary | ICD-10-CM | POA: Diagnosis not present

## 2022-10-24 DIAGNOSIS — F331 Major depressive disorder, recurrent, moderate: Secondary | ICD-10-CM

## 2022-10-24 NOTE — Progress Notes (Signed)
Virtual Visit via Audio Note  I connected with Yesenia Wood on 10/24/22 at 11:00 AM EST by an audio enabled telemedicine application and verified that I am speaking with the correct person using two identifiers.Sign language interpreter was on audio line for assistance.   Location: Patient: home Provider: remote office [place], [place])   I discussed the limitations of evaluation and management by telemedicine and the availability of in person appointments. The patient expressed understanding and agreed to proceed.   I discussed the assessment and treatment plan with the patient. The patient was provided an opportunity to ask questions and all were answered. The patient agreed with the plan and demonstrated an understanding of the instructions.   The patient was advised to call back or seek an in-person evaluation if the symptoms worsen or if the condition fails to improve as anticipated.  I provided 45 minutes of non-face-to-face time during this encounter.   Parnika Tweten R Kashina Mecum, LCSW   THERAPIST PROGRESS NOTE  Session Time: 11-1145a  Participation Level: Active  Behavioral Response: NAAlertAnxious  Type of Therapy: Individual Therapy  Treatment Goals addressed: Problem: Decrease depressive symptoms and improve levels of effective functioning Goal: LTG: Reduce frequency, intensity, and duration of depression symptoms as evidenced by: pt self report Outcome: Progressing Goal: STG: Yesenia Wood WILL PARTICIPATE IN AT LEAST 80% OF SCHEDULED INDIVIDUAL PSYCHOTHERAPY SESSIONS Outcome: Progressing Intervention: REVIEW PLEASE SKILLS (TREAT PHYSICAL ILLNESS, BALANCE EATING, AVOID MOOD-ALTERING SUBSTANCES, BALANCE SLEEP AND GET EXERCISE) WITH Jacki Cones Note: Reviewed    Problem: Reduce overall frequency, intensity, and duration of the anxiety so that daily functioning is not impaired. Goal: LTG: Patient will score less than 5 on the Generalized Anxiety Disorder 7 Scale (GAD-7) Outcome:  Progressing Intervention: Assist with relaxation techniques, as appropriate (deep breathing exercises, meditation, guided imagery) Description: encouraged Note: Reviewed  Intervention: Encourage new environment or opportunities for social interaction Description: Encouraged--pt has been seeking out events Note: Reviewed and encouraged    Interventions: CBT; supportive; narrative   Summary: Yesenia Wood is a 36 y.o. female who presents with stable symptoms related to depression and social anxiety diagnoses. Pt reports that mood is stable and that she has not had any panic episodes recently.   Reviewed coping skills that pt uses when she feels depressed or anxious.Patient reports that she is feeling somewhat better these days, and has enough energy to focus on some knitting projects that she has had in her goals for a while. Patient reports that she is pleased to finally have a referral to physical therapy, and is going to physical therapy one time every two weeks. Patient reports that she is not sure whether the physical therapy is helping with overall pain management or not--patient reports is too soon to make that determination at this point in time.  Patient reports that she still has a lot of anxiety whenever she leaves the house--patient states that she does run errands and does the things that she needs to do, but has that level of stress and anxiety associated with the activities. Allowed patient to identify coping skills and strategies that help her manage these feelings of anxiety whenever they are triggered.  Discussed continuing poor quality and quantity of sleep. Patient reports that she takes hydroxyzine for allergies. Informed patient that hydroxyzine is also a medication that can be used for managing anxiety symptoms and can help individuals fall asleep. Patient reports that it does make her feel very sluggish in the mornings, so patient would have to carefully  weigh out the pros and  cons before taking it at night before bedtime. Encouraged patient to reach out to her primary care physician for any medication questions.  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  Patient reports that she is continuing to attempt improving relationship with her sister, but understands how busy her sister is. Encouraged pt to continue reaching out.  Patient reports recent financial concerns, and states that she had to apply for a credit card because she is not making enough money to pay for continuing services that her dog needs. Patient reports that she is not a complainer, and is very thankful that she receives disability income.  Discussed ongoing pain--patient reports that she is taking CBD currently for overall pain management. Patient reports she only takes  PRN.   Continued recommendations are as follows: self care behaviors, positive social engagements, focusing on overall work/home/life balance, and focusing on positive physical and emotional wellness.   Suicidal/Homicidal: No  Therapist Response: Pt is continuing to apply interventions learned in session into daily life situations. Personal growth and progress is continuing at time of session.Treatment to continue as indicated.   Plan: Return again in 4 weeks.  Diagnosis:  Encounter Diagnoses  Name Primary?   MDD (major depressive disorder), recurrent episode, moderate (HCC) Yes   Social anxiety disorder     Collaboration of Care: Other pt requested referral back to psychiatrist of record, Dr. Jomarie Longs  Patient/Guardian was advised Release of Information must be obtained prior to any record release in order to collaborate their care with an outside provider. Patient/Guardian was advised if they have not already done so to contact the registration department to sign all necessary forms in order for  [place] to release information regarding their care.   Consent: Patient/Guardian gives verbal consent for treatment and assignment of benefits for services provided during this visit. Patient/Guardian expressed understanding and agreed to proceed.    Ernest Haber Chene Kasinger, LCSW 10/24/2022

## 2022-10-24 NOTE — Plan of Care (Signed)
  Problem: Decrease depressive symptoms and improve levels of effective functioning Goal: LTG: Reduce frequency, intensity, and duration of depression symptoms as evidenced by: pt self report Outcome: Progressing Goal: STG: Everlena WILL PARTICIPATE IN AT LEAST 80% OF SCHEDULED INDIVIDUAL PSYCHOTHERAPY SESSIONS Outcome: Progressing Intervention: REVIEW PLEASE SKILLS (TREAT PHYSICAL ILLNESS, BALANCE EATING, AVOID MOOD-ALTERING SUBSTANCES, BALANCE SLEEP AND GET EXERCISE) WITH Jacki Cones Note: Reviewed    Problem: Reduce overall frequency, intensity, and duration of the anxiety so that daily functioning is not impaired. Goal: LTG: Patient will score less than 5 on the Generalized Anxiety Disorder 7 Scale (GAD-7) Outcome: Progressing Intervention: Assist with relaxation techniques, as appropriate (deep breathing exercises, meditation, guided imagery) Description: encouraged Note: Reviewed

## 2022-10-25 DIAGNOSIS — H5203 Hypermetropia, bilateral: Secondary | ICD-10-CM | POA: Diagnosis not present

## 2022-10-26 DIAGNOSIS — G43719 Chronic migraine without aura, intractable, without status migrainosus: Secondary | ICD-10-CM | POA: Diagnosis not present

## 2022-10-26 DIAGNOSIS — M542 Cervicalgia: Secondary | ICD-10-CM | POA: Diagnosis not present

## 2022-10-26 DIAGNOSIS — G518 Other disorders of facial nerve: Secondary | ICD-10-CM | POA: Diagnosis not present

## 2022-10-26 DIAGNOSIS — G43109 Migraine with aura, not intractable, without status migrainosus: Secondary | ICD-10-CM | POA: Diagnosis not present

## 2022-10-26 DIAGNOSIS — M791 Myalgia, unspecified site: Secondary | ICD-10-CM | POA: Diagnosis not present

## 2022-10-27 DIAGNOSIS — R002 Palpitations: Secondary | ICD-10-CM | POA: Diagnosis not present

## 2022-11-01 DIAGNOSIS — M797 Fibromyalgia: Secondary | ICD-10-CM | POA: Diagnosis not present

## 2022-11-01 DIAGNOSIS — M25551 Pain in right hip: Secondary | ICD-10-CM | POA: Diagnosis not present

## 2022-11-01 DIAGNOSIS — M25552 Pain in left hip: Secondary | ICD-10-CM | POA: Diagnosis not present

## 2022-11-15 DIAGNOSIS — M25551 Pain in right hip: Secondary | ICD-10-CM | POA: Diagnosis not present

## 2022-11-15 DIAGNOSIS — M797 Fibromyalgia: Secondary | ICD-10-CM | POA: Diagnosis not present

## 2022-11-15 DIAGNOSIS — M25552 Pain in left hip: Secondary | ICD-10-CM | POA: Diagnosis not present

## 2022-11-17 ENCOUNTER — Ambulatory Visit: Payer: Medicare Other | Attending: Nurse Practitioner

## 2022-11-17 DIAGNOSIS — R0609 Other forms of dyspnea: Secondary | ICD-10-CM | POA: Diagnosis not present

## 2022-11-17 LAB — ECHOCARDIOGRAM COMPLETE
AR max vel: 2.25 cm2
AV Area VTI: 2.34 cm2
AV Area mean vel: 2.18 cm2
AV Mean grad: 3 mmHg
AV Peak grad: 4.8 mmHg
Ao pk vel: 1.1 m/s
Area-P 1/2: 4.29 cm2
Calc EF: 55.4 %
S' Lateral: 3 cm
Single Plane A2C EF: 53 %
Single Plane A4C EF: 55.9 %

## 2022-11-29 DIAGNOSIS — M797 Fibromyalgia: Secondary | ICD-10-CM | POA: Diagnosis not present

## 2022-11-29 DIAGNOSIS — M25552 Pain in left hip: Secondary | ICD-10-CM | POA: Diagnosis not present

## 2022-11-29 DIAGNOSIS — M25551 Pain in right hip: Secondary | ICD-10-CM | POA: Diagnosis not present

## 2022-12-05 DIAGNOSIS — G518 Other disorders of facial nerve: Secondary | ICD-10-CM | POA: Diagnosis not present

## 2022-12-05 DIAGNOSIS — M542 Cervicalgia: Secondary | ICD-10-CM | POA: Diagnosis not present

## 2022-12-05 DIAGNOSIS — G43719 Chronic migraine without aura, intractable, without status migrainosus: Secondary | ICD-10-CM | POA: Diagnosis not present

## 2022-12-05 DIAGNOSIS — M791 Myalgia, unspecified site: Secondary | ICD-10-CM | POA: Diagnosis not present

## 2022-12-09 ENCOUNTER — Ambulatory Visit (INDEPENDENT_AMBULATORY_CARE_PROVIDER_SITE_OTHER): Payer: Medicare Other | Admitting: Licensed Clinical Social Worker

## 2022-12-09 DIAGNOSIS — F4010 Social phobia, unspecified: Secondary | ICD-10-CM | POA: Diagnosis not present

## 2022-12-09 DIAGNOSIS — F331 Major depressive disorder, recurrent, moderate: Secondary | ICD-10-CM

## 2022-12-09 NOTE — Progress Notes (Signed)
Virtual Visit via Audio Note  I connected with Yesenia Wood on 12/09/22 at 11:00 AM EST by an audio enabled telemedicine application and verified that I am speaking with the correct person using two identifiers.Sign language interpreter was on audio line for assistance.   Location: Patient: home Provider: remote office [place], [place])   I discussed the limitations of evaluation and management by telemedicine and the availability of in person appointments. The patient expressed understanding and agreed to proceed.   I discussed the assessment and treatment plan with the patient. The patient was provided an opportunity to ask questions and all were answered. The patient agreed with the plan and demonstrated an understanding of the instructions.   The patient was advised to call back or seek an in-person evaluation if the symptoms worsen or if the condition fails to improve as anticipated.  I provided 45 minutes of non-face-to-face time during this encounter.   [place], LCSW   THERAPIST PROGRESS NOTE  Session Time: [id number]  Participation Level: Active  Behavioral Response: NAAlertAnxious  Type of Therapy: Individual Therapy  Treatment Goals addressed:  Develop healthy thinking patterns and beliefs about self, others, and the world that lead to the alleviation and help prevent the relapse of depression per self report 3 out of 5 sessions documented    Learn and implement coping skills that result in a reduction of anxiety and worry, and improve daily functioning per pt report 3 out of 5 sessions documented   Interventions: CBT; supportive; narrative   Summary: Yesenia Wood is a 37 y.o. female who presents with stable symptoms related to depression and social anxiety diagnoses. Pt reports that mood is stable and that she has not had any panic episodes recently.   Allowed pt to explore and express thoughts and feelings associated with recent life situations and  external stressors.Patient reports that she is continuing to experience stress associated with health related concerns and chronic pain. Patient reports that recently she had some dental work done, so that has impacted her overall eating behaviors. Patient reports that she is only eating soft foods at time of session. Patient reports that she has been engaging in physical therapy to help manage chronic pain, but does not feel that she is progressing so she has discontinued that service. Patient states that she is trying to walk on her own everyday with her parents and dogs, and that she is engaging in physical activity on her stationary bike.  Patient states that she is continuing to use CBD in the mornings to help manage her chronic pain. Allowed patient to explore coping skills that she uses to help manage depression symptoms, and reviewed ways that patient is managing overall stress symptoms and anxiety symptoms. Discussed exposure and response prevention regarding specific fears (driving) and encouraged patient engaging in positive social situations.  Continued recommendations are as follows: self care behaviors, positive social engagements, focusing on overall work/home/life balance, and focusing on positive physical and emotional wellness.   Suicidal/Homicidal: No  Therapist Response: Pt is continuing to apply interventions learned in session into daily life situations. Personal growth and progress is continuing at time of session.Treatment to continue as indicated.   Plan: Return again in 4 weeks.  Diagnosis:  Encounter Diagnoses  Name Primary?   MDD (major depressive disorder), recurrent episode, moderate ([place]) Yes   Social anxiety disorder    Collaboration of Care: Other pt requested referral back to psychiatrist of record, Dr. Ursula Alert  Patient/Guardian was  advised Release of Information must be obtained prior to any record release in order to collaborate their care with an  outside provider. Patient/Guardian was advised if they have not already done so to contact the registration department to sign all necessary forms in order for [place] to release information regarding their care.   Consent: Patient/Guardian gives verbal consent for treatment and assignment of benefits for services provided during this visit. Patient/Guardian expressed understanding and agreed to proceed.    [place], LCSW 12/09/2022

## 2022-12-30 DIAGNOSIS — M791 Myalgia, unspecified site: Secondary | ICD-10-CM | POA: Diagnosis not present

## 2022-12-30 DIAGNOSIS — G43719 Chronic migraine without aura, intractable, without status migrainosus: Secondary | ICD-10-CM | POA: Diagnosis not present

## 2022-12-30 DIAGNOSIS — G518 Other disorders of facial nerve: Secondary | ICD-10-CM | POA: Diagnosis not present

## 2022-12-30 DIAGNOSIS — M542 Cervicalgia: Secondary | ICD-10-CM | POA: Diagnosis not present

## 2023-01-10 ENCOUNTER — Other Ambulatory Visit: Payer: Self-pay | Admitting: Family Medicine

## 2023-01-10 ENCOUNTER — Encounter: Payer: Self-pay | Admitting: Family Medicine

## 2023-01-10 DIAGNOSIS — M797 Fibromyalgia: Secondary | ICD-10-CM

## 2023-01-10 DIAGNOSIS — G8929 Other chronic pain: Secondary | ICD-10-CM

## 2023-01-10 DIAGNOSIS — M62838 Other muscle spasm: Secondary | ICD-10-CM

## 2023-01-19 ENCOUNTER — Encounter: Payer: Self-pay | Admitting: Physical Medicine and Rehabilitation

## 2023-01-19 ENCOUNTER — Ambulatory Visit (INDEPENDENT_AMBULATORY_CARE_PROVIDER_SITE_OTHER): Payer: Medicare Other | Admitting: Licensed Clinical Social Worker

## 2023-01-19 DIAGNOSIS — F4010 Social phobia, unspecified: Secondary | ICD-10-CM

## 2023-01-19 DIAGNOSIS — F331 Major depressive disorder, recurrent, moderate: Secondary | ICD-10-CM | POA: Diagnosis not present

## 2023-01-19 NOTE — Progress Notes (Signed)
Virtual Visit via Audio Note  I connected with Yesenia Wood on 01/19/23 at  1:00 PM EST by an audio enabled telemedicine application and verified that I am speaking with the correct person using two identifiers.Sign language interpreter was on audio line for assistance.   Location: Patient: home Provider: [place] Office   I discussed the limitations of evaluation and management by telemedicine and the availability of in person appointments. The patient expressed understanding and agreed to proceed.   I discussed the assessment and treatment plan with the patient. The patient was provided an opportunity to ask questions and all were answered. The patient agreed with the plan and demonstrated an understanding of the instructions.   The patient was advised to call back or seek an in-person evaluation if the symptoms worsen or if the condition fails to improve as anticipated.  I provided 30 minutes of non-face-to-face time during this encounter.   Callyn Severtson R Foy Vanduyne, LCSW   THERAPIST PROGRESS NOTE  Session Time: 1-130p  Participation Level: Active  Behavioral Response: NAAlertAnxious  Type of Therapy: Individual Therapy  Treatment Goals addressed:  Develop healthy thinking patterns and beliefs about self, others, and the world that lead to the alleviation and help prevent the relapse of depression per self report 3 out of 5 sessions documented    Learn and implement coping skills that result in a reduction of anxiety and worry, and improve daily functioning per pt report 3 out of 5 sessions documented   Interventions: CBT; supportive; narrative   Summary: Yesenia Wood is a 37 y.o. female who presents with stable symptoms related to depression and social anxiety diagnoses. Pt reports that mood is stable and that she has not had any panic episodes recently.   Allowed pt to explore and express thoughts and feelings associated with recent life  situations and external stressors.  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  Patient reports that her mother is experiencing some heart related issues, and that her father is recovering from surgery.  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  Patient states that she is not used to being up and cooking 3 times a day, but patient is the one preparing all the meals for everyone.  Patient states that she is continuing to experience sleep related issues--reviewed sleep hygiene, and allow patient to explore relaxation activities that she has tried prior to going to bed.  Patient states that she is continuing to feel stressed, and depressed.  Reviewed anxiety management and depression management coping skills with patient.  Reminded patient that she has made the choice to not take medication to manage depression and anxiety symptoms--encouraged patient to reach out to a medical professional if she decides that she wants to change her mind.  Patient feels that she may be at that window currently, and assured clinician that she would reach out to her primary care physician or psychiatrist for medication management of symptoms.  Patient states that she wants to allow pain management physicians to use interventions for pain management--patient feels that that in itself may help her with her symptoms of depression.  Patient denies any suicidal ideation at time of session.  Continued recommendations are as follows: self care behaviors, positive social engagements, focusing on overall work/home/life balance, and focusing on positive physical and emotional wellness.   Suicidal/Homicidal: No  Therapist Response: Pt is continuing to apply interventions learned in session into daily life situations. Personal growth and progress is continuing at time of session.Treatment  to continue as indicated.   Plan: Return again in 4 weeks.  Diagnosis:  Encounter Diagnoses  Name Primary?   MDD (major depressive disorder), recurrent episode, moderate ([place]) Yes   Social anxiety disorder    Collaboration of Care: Other pt requested referral back to psychiatrist of record, Dr. Ursula Alert  Patient/Guardian was advised Release of Information must be obtained prior to any record release in order to collaborate their care with an outside provider. Patient/Guardian was advised if they have not already done so to contact the registration department to sign all necessary forms in order for [place] to release information regarding their care.   Consent: Patient/Guardian gives verbal consent for treatment and assignment of benefits for services provided during this visit. Patient/Guardian expressed understanding and agreed to proceed.    [place], LCSW 01/19/2023

## 2023-01-25 DIAGNOSIS — G43719 Chronic migraine without aura, intractable, without status migrainosus: Secondary | ICD-10-CM | POA: Diagnosis not present

## 2023-01-25 DIAGNOSIS — G518 Other disorders of facial nerve: Secondary | ICD-10-CM | POA: Diagnosis not present

## 2023-01-25 DIAGNOSIS — M791 Myalgia, unspecified site: Secondary | ICD-10-CM | POA: Diagnosis not present

## 2023-01-25 DIAGNOSIS — M542 Cervicalgia: Secondary | ICD-10-CM | POA: Diagnosis not present

## 2023-01-25 DIAGNOSIS — G43109 Migraine with aura, not intractable, without status migrainosus: Secondary | ICD-10-CM | POA: Diagnosis not present

## 2023-01-26 ENCOUNTER — Encounter: Payer: Self-pay | Admitting: Cardiovascular Disease

## 2023-01-26 ENCOUNTER — Ambulatory Visit: Payer: Medicare Other | Attending: Cardiovascular Disease | Admitting: Cardiovascular Disease

## 2023-01-26 VITALS — BP 110/68 | HR 74 | Ht 63.0 in | Wt 179.4 lb

## 2023-01-26 DIAGNOSIS — I4711 Inappropriate sinus tachycardia, so stated: Secondary | ICD-10-CM | POA: Diagnosis not present

## 2023-01-26 DIAGNOSIS — E785 Hyperlipidemia, unspecified: Secondary | ICD-10-CM | POA: Diagnosis not present

## 2023-01-26 MED ORDER — METOPROLOL SUCCINATE ER 50 MG PO TB24: 50.0000 mg | ORAL_TABLET | Freq: Every day | ORAL | 3 refills | Status: DC

## 2023-01-26 NOTE — Patient Instructions (Signed)
Medication Instructions:  DECREASE the Metoprolol Succinate to 50 mg once daily  *If you need a refill on your cardiac medications before your next appointment, please call your pharmacy*   Lab Work: None ordered If you have labs (blood work) drawn today and your tests are completely normal, you will receive your results only by: [place] (if you have MyChart) OR A paper copy in the mail If you have any lab test that is abnormal or we need to change your treatment, we will call you to review the results.   Testing/Procedures: None ordered   Follow-Up: At [facility], you and your health needs are our priority.  As part of our continuing mission to provide you with exceptional heart care, we have created designated Provider Care Teams.  These Care Teams include your primary Cardiologist (physician) and Advanced Practice Providers (APPs -  Physician Assistants and Nurse Practitioners) who all work together to provide you with the care you need, when you need it.  We recommend signing up for the patient portal called "MyChart".  Sign up information is provided on this After Visit Summary.  MyChart is used to connect with patients for Virtual Visits (Telemedicine).  Patients are able to view lab/test results, encounter notes, upcoming appointments, etc.  Non-urgent messages can be sent to your provider as well.   To learn more about what you can do with MyChart, go to [email or web address].    Your next appointment:   6 month(s)  Provider:   You may see Dr. Fletcher Anon or one of the following Advanced Practice Providers on your designated Care Team:   Murray Hodgkins, NP Christell Faith, PA-C [place] Kathlen Mody, PA-C Gerrie Nordmann, NP

## 2023-01-26 NOTE — Progress Notes (Signed)
Cardiology Office Note   Date:  01/26/2023   ID:  Yesenia Wood, DOB 1986-02-21, MRN [id number]  PCP:  Steele Sizer, MD  Cardiologist:   Kathlyn Sacramento, MD   Chief Complaint  Patient presents with   Other    6 month f/u no complaints today. Meds reviewed verbally with pt.      History of Present Illness: Yesenia Wood is a 36 y.o. female who presents for a follow-up visit regarding inappropriate sinus tachycardia.  This visit was facilitated with an interpreter given that the patient is deaf.  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  Prior echo in [place] 2015 was completely normal. She underwent treadmill stress testing 06/23/2015 that was normal. However, her HR and BP were elevated at peak exercise. 48-hour Holter monitor showed sinus tachycardia. She was initially treated with diltiazem but did not respond very well and ultimately switched to Toprol. She had previous cardiac CTA in November 2020 which showed a calcium score of 0 with no evidence of coronary artery disease.  She was most recently seen in August 2023.Marland Kitchen  At that time she complained of increased palpitations mostly at night.  Previous sleep study in 2017 showed mild sleep apnea. She had an outpatient ZIO monitor in November which showed normal sinus rhythm with rare premature beats.  Most triggered events did not correlate with arrhythmia.  Average heart rate was 75 bpm.  Echocardiogram was done in December which showed normal LV systolic function with mild mitral regurgitation and mild aortic sclerosis.  She was switched from acebutolol to Toprol with overall improvement and palpitations.  However, she reports intermittent dizziness and also she feels that her heart rate does not increase significantly with exercise which has been frustrating to her as she is trying to lose  weight.  Past Medical History:  Diagnosis Date   ADHD (attention deficit hyperactivity disorder)    B12 deficiency    Bell's palsy    right sided   Chest pain    a. 05/2015 ETT: elev HR/BP, but no acute ST/T changes; b. 09/2019 Cor CTA: Nl cors, Ca2+ = 0.   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   Fatigue    Fatty liver    Fibromyalgia    GERD (gastroesophageal reflux disease)    Headache    High triglycerides    Hyperglycemia    Hypertension    Hyperthyroidism    Moderate asthma    Obesity    Paresthesia    Premature birth     Past Surgical History:  Procedure Laterality Date   BARTHOLIN GLAND CYST REMOVAL     COCHLEAR IMPLANT     COCHLEAR IMPLANT REMOVAL     DILATATION AND CURETTAGE/HYSTEROSCOPY WITH MINERVA N/A 03/05/2021   Procedure: DILATATION AND CURETTAGE/HYSTEROSCOPY WITH MINERVA;  Surgeon: Rubie Maid, MD;  Location: [facility] ORS;  Service: Gynecology;  Laterality: N/A;   LAPAROSCOPIC TUBAL LIGATION Bilateral 03/26/2018   Procedure: LAPAROSCOPIC TUBAL LIGATION;  Surgeon: Rubie Maid, MD;  Location: [facility] ORS;  Service: Gynecology;  Laterality: Bilateral;  with peritoneal biopsies   LAPAROSCOPY     MYRINGOTOMY WITH TUBE PLACEMENT     TUBAL LIGATION  03/26/2018     Current Outpatient Medications  Medication Sig Dispense Refill   BOTOX 100  units SOLR injection Inject 100 Units into the muscle every 3 (three) months.     calcium carbonate (TUMS - DOSED IN MG ELEMENTAL CALCIUM) 500 MG chewable tablet Chew 1 tablet by mouth daily as needed for indigestion or heartburn.     Cholecalciferol (VITAMIN D) 50 MCG (2000 UT) CAPS Take 1 capsule by mouth daily.     Cyanocobalamin (VITAMIN B-12) 500 MCG SUBL Place 1 tablet (500 mcg total) under the tongue once a week. 30 tablet 0   docusate sodium (COLACE) 100 MG capsule Take 100 mg by mouth daily as needed for mild constipation.      famotidine (PEPCID) 40 MG tablet Take 1 tablet (40 mg total) by mouth daily. For reflux and nausea  in am's 90 tablet 1   ferrous sulfate 325 (65 FE) MG tablet Take 325 mg by mouth daily with breakfast.     FIBER ADULT GUMMIES PO Take 15 mg by mouth daily.     folic acid (FOLVITE) Q000111Q MCG tablet Take 800 mcg by mouth daily. 800 mcg     hydrOXYzine (VISTARIL) 25 MG capsule TAKE 1-2 CAPSULES (25-50 MG TOTAL) BY MOUTH EVERY 8 (EIGHT) HOURS AS NEEDED. 30 capsule 0   loratadine (CLARITIN) 10 MG tablet TAKE 1 TABLET BY MOUTH EVERY DAY 90 tablet 1   NON FORMULARY CBD soft gel as needed.     ondansetron (ZOFRAN ODT) 4 MG disintegrating tablet Take 1 tablet (4 mg total) by mouth every 6 (six) hours as needed for nausea or vomiting. 20 tablet 0   polyethylene glycol powder (GLYCOLAX/MIRALAX) 17 GM/SCOOP powder Take 17 g by mouth daily. 3350 g 1   rosuvastatin (CRESTOR) 20 MG tablet Take 1 tablet (20 mg total) by mouth daily. 90 tablet 1   triamcinolone cream (KENALOG) 0.1 % Apply 1 Application topically 2 (two) times daily. 453.6 g 0   metoprolol succinate (TOPROL-XL) 50 MG 24 hr tablet Take 1 tablet (50 mg total) by mouth daily. 90 tablet 3   No current facility-administered medications for this visit.    Allergies:   Budesonide-formoterol fumarate, Astelin [azelastine], Gabapentin, Nortriptyline, Fluogen [influenza virus vaccine], Lamictal [lamotrigine], Milnacipran, Tegaderm ag mesh [silver], Diclofenac, Duloxetine hcl, Montelukast sodium, Mupirocin, Naproxen, Naproxen sodium, Other, Oxycodone hcl, and Sulfa antibiotics    Social History:  The patient  reports that she quit smoking about 16 years ago. Her smoking use included cigarettes. She started smoking about 18 years ago. She has a 2.00 pack-year smoking history. She has never used smokeless tobacco. She reports current alcohol use. She reports that she does not use drugs.   Family History:  The patient's She was adopted. Family history is unknown  by patient.    ROS:  Please see the history of present illness.   Otherwise, review of systems are positive for none.   All other systems are reviewed and negative.    PHYSICAL EXAM: VS:  BP 110/68 (BP Location: Left Arm, Patient Position: Sitting, Cuff Size: Normal)   Pulse 74   Ht '5\' 3"'$  (1.6 m)   Wt 179 lb 6 oz (81.4 kg)   SpO2 98%   BMI 31.77 kg/m  , BMI Body mass index is 31.77 kg/m. GEN: Well nourished, well developed, in no acute distress  HEENT: normal  Neck: no JVD, carotid bruits, or masses Cardiac: RRR; no murmurs, rubs, or gallops,no edema  Respiratory:  clear to auscultation bilaterally, normal work of breathing GI: soft, nontender, nondistended, + BS MS: no deformity or  atrophy  Skin: warm and dry, no rash Neuro:  Strength and sensation are intact Psych: euthymic mood, full affect   EKG:  EKG is ordered today. The ekg ordered today demonstrates normal sinus rhythm with no significant ST or T wave changes.   Recent Labs: 02/10/2022: Hemoglobin 14.0; Platelets 279    Lipid Panel    Component Value Date/Time   CHOL 130 02/10/2022 1158   CHOL 192 10/29/2015 1050   TRIG 126 02/10/2022 1158   HDL 48 (L) 02/10/2022 1158   HDL 43 10/29/2015 1050   CHOLHDL 2.7 02/10/2022 1158   VLDL 20 10/10/2016 1235   LDLCALC 61 02/10/2022 1158      Wt Readings from Last 3 Encounters:  01/26/23 179 lb 6 oz (81.4 kg)  08/19/22 168 lb (76.2 kg)  08/12/22 168 lb (76.2 kg)       ASSESSMENT AND PLAN:  ASSESSMENT & PLAN:    1.  Inappropriate sinus tachycardia: Symptoms are well-controlled with Toprol 100 mg once daily.  However, she reports inability to increase heart rate significantly with exercise and frustration of inability to lose weight.  I elected to decrease the dose to 50 mg once daily as a trial.   2. Shortness of breath: Previous cardiac CTA was normal and recent echocardiogram was unremarkable.  3.  Hyperlipidemia: Lipid profile 2019 showed an LDL of 195 .   She is currently on rosuvastatin 20 mg once daily.  Most recent lipid profile showed an LDL of 61.    Disposition:   FU with me in 6 months  Signed,   Kathlyn Sacramento, MD  01/26/2023 11:24 AM    [place]

## 2023-01-31 NOTE — Telephone Encounter (Unsigned)
Copied from [place] [id number]. Topic: General - Other >> Jan 31, 2023  4:07 PM Chapman Fitch wrote: Reason for CRM: provider is participating in members plan but has no active contract with [place] no premium HMO plan / please advise / pt wants to keep Dr. Ancil Boozer as her provider / they were not able to assign Dr. Ancil Boozer again and pt is worried/ Dr Ancil Boozer would need to renew or pt would pay out of pocket/ please update this info so they can update pts info asap

## 2023-02-09 NOTE — Progress Notes (Addendum)
Name: Yesenia Wood   MRN: [id number]    DOB: [birth year]   Date:02/10/2023       Progress Note  Subjective  Chief Complaint  Follow Up  Macy Mis was the interpreter today   HPI  Headaches and paresthesias: seeing Dr. Domingo Cocking in [place] , she is going every 4 weeks to either have botox or steroid injections , she still have headaches almost daily .   Palpitation: sees Dr. Fletcher Anon, her last visit at his office was with his NP [place] Donovan. She had Echo 11/2019 that showed normal LVEF, CTA 09/2019 calcium score of zero. She had a 48 hour Holter that showed ST and was given Diltiazem but unable to tolerate it, she has been on Metoprolol and was doing well for a while but on her last visit 07/2020 she is down to 50 mg per day and symptoms seems to be controlled    FMS: she had a flare , seen by Dr. Rosana Berger, had elevated ANA and was referred to Rheumatologist - Dr. Benjamine Mola and was given reassurance, pain from [facility]. She was  advised to have PT but never got an appointment , she continues to have pain all over and also has balance problems, even has pain when showering and has an appointment coming up with PMR  Vitamin D deficiency: improved with supplementation , she also takes folic acid and [id number] otc   Dyslipidemia: doing well on Rosuvastatin, last LDl was 61, HDL improved from 27 to 48   MDD: she has not seen  Dr. Shea Evans in a while but has been meeting with clinical social worker - Christina Hussani. Currently not on medication since it did not help in the past Phq 9 is higher but she has tried multiple medications and had side effects.   Mild persistent asthma : she was seen by pulmonologist, does not have medications at home, she states none of the medications helped She continues to have intermittent sob, wheezing and cough intermittently . She is allergic to singulair   Pelvic pain: she had a D& C in 2022 by Dr. 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   Dissocial personality disorder ([place]) 06/07/2016   Sleep disturbance 05/10/2016   Chronic migraine without aura 03/29/2016   Palpitations 06/09/2015   Deaf 05/16/2015   B12 deficiency 05/16/2015   Cochlear implant status 05/16/2015   Fatty infiltration of liver 05/16/2015   Gastro-esophageal reflux disease without esophagitis 05/16/2015   Generalized headache 05/16/2015   Right-sided Bell's palsy 05/16/2015   Hypertriglyceridemia 05/16/2015   Irregular menstrual cycle 05/16/2015   Overweight 05/16/2015   Paresthesia 05/16/2015   Disorder of labyrinth 05/16/2015   Migraine 08/05/2013   Allergic rhinitis 07/01/2013   Obesity (BMI 30-39.9) 07/01/2013   Bartholin gland cyst 09/16/2008    Past Surgical History:  Procedure Laterality Date   BARTHOLIN GLAND CYST REMOVAL     COCHLEAR IMPLANT     COCHLEAR IMPLANT REMOVAL  DILATATION AND CURETTAGE/HYSTEROSCOPY WITH MINERVA N/A 03/05/2021   Procedure: DILATATION AND CURETTAGE/HYSTEROSCOPY WITH MINERVA;  Surgeon: Rubie Maid, MD;  Location: [facility] ORS;  Service: Gynecology;  Laterality: N/A;   LAPAROSCOPIC TUBAL LIGATION Bilateral 03/26/2018   Procedure: LAPAROSCOPIC TUBAL LIGATION;  Surgeon: Rubie Maid, MD;  Location: [facility] OR[facility];  Service: Gynecology;  Laterality: Bilateral;  with peritoneal biopsies   LAPAROSCOPY     MYRINGOTOMY WITH TUBE PLACEMENT     TUBAL LIGATION  03/26/2018    Family History  Adopted: Yes   Family history unknown: Yes    Social History   Tobacco Use   Smoking status: Former    Packs/day: 1.00    Years: 2.00    Additional pack years: 0.00    Total pack years: 2.00    Types: Cigarettes    Start date: 11/28/2004    Quit date: 11/28/2006    Years since quitting: 16.2   Smokeless tobacco: Never  Substance Use Topics   Alcohol use: Yes    Comment: 1 every 3 months     Current Outpatient Medications:    BOTOX 100 units SOLR injection, Inject 100 Units into the muscle every 3 (three) months., Disp: , Rfl:    Cholecalciferol (VITAMIN D) 50 MCG (2000 UT) CAPS, Take 1 capsule by mouth daily., Disp: , Rfl:    Cyanocobalamin (VITAMIN B-12) 500 MCG SUBL, Place 1 tablet (500 mcg total) under the tongue once a week., Disp: 30 tablet, Rfl: 0   docusate sodium (COLACE) 100 MG capsule, Take 100 mg by mouth daily as needed for mild constipation., Disp: , Rfl:    ferrous sulfate 325 (65 FE) MG tablet, Take 325 mg by mouth daily with breakfast., Disp: , Rfl:    FIBER ADULT GUMMIES PO, Take 15 mg by mouth daily., Disp: , Rfl:    folic acid (FOLVITE) [id number] MCG tablet, Take 800 mcg by mouth daily. 800 mcg, Disp: , Rfl:    loratadine (CLARITIN) 10 MG tablet, TAKE 1 TABLET BY MOUTH EVERY DAY, Disp: 90 tablet, Rfl: 1   metoprolol succinate (TOPROL-XL) 50 MG 24 hr tablet, Take 1 tablet (50 mg total) by mouth daily., Disp: 90 tablet, Rfl: 3   NON FORMULARY, CBD soft gel as needed., Disp: , Rfl:    polyethylene glycol powder (GLYCOLAX/MIRALAX) 17 GM/SCOOP powder, Take 17 g by mouth daily., Disp: 3350 g, Rfl: 1   triamcinolone cream (KENALOG) 0.1 %, Apply 1 Application topically 2 (two) times daily., Disp: 453.6 g, Rfl: 0   famotidine (PEPCID) 40 MG tablet, Take 1 tablet (40 mg total) by mouth daily. For reflux and nausea  in am's, Disp: 90 tablet, Rfl: 1   rosuvastatin (CRESTOR) 20 MG tablet, Take 1 tablet (20 mg total) by mouth daily., Disp: 90 tablet, Rfl: 1  Allergies  Allergen Reactions    Budesonide-Formoterol Fumarate Anaphylaxis and Other (See Comments)    Other reaction(s): Other (See Comments) chest pain Chest pain chest pain   Astelin [Azelastine]     Tingling, numbness, nausea   Gabapentin Nausea And Vomiting   Nortriptyline Other (See Comments)    Other reaction(s): Other (See Comments)   Fluogen [Influenza Virus Vaccine]     rash   Lamictal [Lamotrigine]     Self injurious thoughts   Milnacipran Other (See Comments)    Pressure in eyes   Tegaderm Ag Mesh [Silver] Itching   Diclofenac Other (See Comments)    Other reaction(s): Other (See Comments)   Duloxetine Hcl Other (See Comments)  Other reaction(s): Other (See Comments) Other reaction(s): unknown   Montelukast Sodium Other (See Comments)    Other reaction(s): wheezing/sob   Mupirocin Other (See Comments)   Naproxen Other (See Comments) and Rash    Other reaction(s): rash/itching   Naproxen Sodium Rash    mild rash   Other Rash   Oxycodone Hcl Rash   Sulfa Antibiotics Hives, Rash and Itching    I personally reviewed active problem list, medication list, allergies, family history, social history, health maintenance with the patient/caregiver today.   ROS  Ten systems reviewed and is negative except as mentioned in HPI   Objective  Vitals:   02/10/23 1002  BP: 100/70  Pulse: 86  Resp: 14  Temp: 98 F (36.7 C)  TempSrc: Oral  SpO2: 97%  Weight: 178 lb 11.2 oz (81.1 kg)  Height: 5\' 3"  (1.6 m)    Body mass index is 31.66 kg/m.  Physical Exam  Constitutional: Patient appears well-developed and well-nourished. Obese  No distress.  HEENT: head atraumatic, normocephalic, pupils equal and reactive to light,, neck supple Cardiovascular: Normal rate, regular rhythm and normal heart sounds.  No murmur heard. No BLE edema. Pulmonary/Chest: Effort normal and breath sounds normal. No respiratory distress. Abdominal: Soft.  There is no tenderness. Muscular skeletal: trigger point positive   Psychiatric: Patient has a normal mood and affect. behavior is normal. Judgment and thought content normal.   Recent Results (from the past 2160 hour(s))  ECHOCARDIOGRAM COMPLETE     Status: None   Collection Time: 11/17/22  4:30 PM  Result Value Ref Range   AR max vel 2.25 cm2   AV Peak grad 4.8 mmHg   Ao pk vel 1.10 m/s   S' Lateral 3.00 cm   Area-P 1/2 4.29 cm2   AV Area VTI 2.34 cm2   AV Mean grad 3.0 mmHg   Single Plane A4C EF 55.9 %   Single Plane A2C EF 53.0 %   Calc EF 55.4 %   AV Area mean vel 2.18 cm2    PHQ2/9:    02/10/2023   10:14 AM 10/24/2022   11:15 AM 08/19/2022   11:22 AM 08/12/2022    9:51 AM 06/28/2022    1:54 PM  Depression screen PHQ 2/9  Decreased Interest 1  1 1    Down, Depressed, Hopeless 1  1 1    PHQ - 2 Score 2  2 2    Altered sleeping 3  3 3    Tired, decreased energy 3  3 3    Change in appetite 1  0 0   Feeling bad or failure about yourself  1  0 0   Trouble concentrating 1  1 1    Moving slowly or fidgety/restless 1  0 0   Suicidal thoughts 0  0 0   PHQ-9 Score 12  9 9    Difficult doing work/chores Somewhat difficult         Information is confidential and restricted. Go to Review Flowsheets to unlock data.    phq 9 is positive   Fall Risk:    02/10/2023   10:02 AM 08/19/2022   11:22 AM 08/12/2022    9:51 AM 02/10/2022   11:19 AM 02/10/2022   11:04 AM  Fall Risk   Falls in the past year? 0 1 1 1 1   Number falls in past yr:  0 0 0 1  Injury with Fall?  0 0 0 0  Risk for fall due to : No Fall Risks No  Fall Risks No Fall Risks  History of fall(s)  Follow up Falls prevention discussed;Education provided;Falls evaluation completed Falls prevention discussed Falls prevention discussed  Falls prevention discussed      Functional Status Survey: Is the patient deaf or have difficulty hearing?: Yes Does the patient have difficulty seeing, even when wearing glasses/contacts?: Yes Does the patient have difficulty concentrating, remembering, or  making decisions?: No Does the patient have difficulty walking or climbing stairs?: Yes Does the patient have difficulty dressing or bathing?: Yes Does the patient have difficulty doing errands alone such as visiting a doctor's office or shopping?: No    Assessment & Plan  1. MDD (major depressive disorder), recurrent episode, moderate (H[facility])  - COMPLETE METABOLIC PANEL WITH GFR Advised to see psychiatrist   2. Chronic migraine without aura without status migrainosus, not intractable  Keep follow up with neurologist   3. Vitamin D deficiency  - VITAMIN D 25 Hydroxy (Vit-D Deficiency, Fractures)  4. B12 deficiency  - CBC with Differential/Platelet - B12 and Folate Panel  5. Fibromyalgia  Keep upcoming appointment with PMR  6. Mild persistent asthma without complication  Stable   7. Gastro-esophageal reflux disease without esophagitis  - famotidine (PEPCID) 40 MG tablet; Take 1 tablet (40 mg total) by mouth daily. For reflux and nausea  in am's  Dispense: 90 tablet; Refill: 1  8. Chronic idiopathic constipation   9. Fatty infiltration of liver  Recheck level  10. Iron deficiency anemia, unspecified iron deficiency anemia type  - CBC with Differential/Platelet  11. Dyslipidemia  - rosuvastatin (CRESTOR) 20 MG tablet; Take 1 tablet (20 mg total) by mouth daily.  Dispense: 90 tablet; Refill: 1 - Lipid panel  12. Diabetes mellitus screening  - Hemoglobin A1c

## 2023-02-10 ENCOUNTER — Ambulatory Visit: Payer: Self-pay | Admitting: Family Medicine

## 2023-02-10 ENCOUNTER — Encounter: Payer: Self-pay | Admitting: Family Medicine

## 2023-02-10 VITALS — BP 100/70 | HR 86 | Temp 98.0°F | Resp 14 | Ht 63.0 in | Wt 178.7 lb

## 2023-02-10 DIAGNOSIS — Z131 Encounter for screening for diabetes mellitus: Secondary | ICD-10-CM

## 2023-02-10 DIAGNOSIS — D509 Iron deficiency anemia, unspecified: Secondary | ICD-10-CM

## 2023-02-10 DIAGNOSIS — J4530 Mild persistent asthma, uncomplicated: Secondary | ICD-10-CM

## 2023-02-10 DIAGNOSIS — E559 Vitamin D deficiency, unspecified: Secondary | ICD-10-CM

## 2023-02-10 DIAGNOSIS — K760 Fatty (change of) liver, not elsewhere classified: Secondary | ICD-10-CM

## 2023-02-10 DIAGNOSIS — E785 Hyperlipidemia, unspecified: Secondary | ICD-10-CM

## 2023-02-10 DIAGNOSIS — E538 Deficiency of other specified B group vitamins: Secondary | ICD-10-CM

## 2023-02-10 DIAGNOSIS — F331 Major depressive disorder, recurrent, moderate: Secondary | ICD-10-CM

## 2023-02-10 DIAGNOSIS — K5904 Chronic idiopathic constipation: Secondary | ICD-10-CM

## 2023-02-10 DIAGNOSIS — K219 Gastro-esophageal reflux disease without esophagitis: Secondary | ICD-10-CM

## 2023-02-10 DIAGNOSIS — G43709 Chronic migraine without aura, not intractable, without status migrainosus: Secondary | ICD-10-CM

## 2023-02-10 DIAGNOSIS — M797 Fibromyalgia: Secondary | ICD-10-CM

## 2023-02-10 MED ORDER — FAMOTIDINE 40 MG PO TABS: 40.0000 mg | ORAL_TABLET | Freq: Every day | ORAL | 1 refills | Status: DC

## 2023-02-10 MED ORDER — ROSUVASTATIN CALCIUM 20 MG PO TABS: 20.0000 mg | ORAL_TABLET | Freq: Every day | ORAL | 1 refills | Status: DC

## 2023-02-10 NOTE — Patient Instructions (Addendum)
Arman Filter is a new medication It has to be added to another anti-depressant   Unisom otc sleep aid

## 2023-02-11 LAB — B12 AND FOLATE PANEL
Folate: 19.6 ng/mL
Vitamin B-12: 475 pg/mL (ref 200–1100)

## 2023-02-11 LAB — CBC WITH DIFFERENTIAL/PLATELET
Absolute Monocytes: 809 cells/uL (ref 200–950)
Basophils Absolute: 62 cells/uL (ref 0–200)
Basophils Relative: 0.8 %
Eosinophils Absolute: 31 cells/uL (ref 15–500)
Eosinophils Relative: 0.4 %
HCT: 43.3 % (ref 35.0–45.0)
Hemoglobin: 14.1 g/dL (ref 11.7–15.5)
Lymphs Abs: 2780 cells/uL (ref 850–3900)
MCH: 28.6 pg (ref 27.0–33.0)
MCHC: 32.6 g/dL (ref 32.0–36.0)
MCV: 87.8 fL (ref 80.0–100.0)
MPV: 10.1 fL (ref 7.5–12.5)
Monocytes Relative: 10.5 %
Neutro Abs: 4019 cells/uL (ref 1500–7800)
Neutrophils Relative %: 52.2 %
Platelets: 301 10*3/uL (ref 140–400)
RBC: 4.93 10*6/uL (ref 3.80–5.10)
RDW: 12.6 % (ref 11.0–15.0)
Total Lymphocyte: 36.1 %
WBC: 7.7 10*3/uL (ref 3.8–10.8)

## 2023-02-11 LAB — COMPLETE METABOLIC PANEL WITH GFR
AG Ratio: 1.8 (calc) (ref 1.0–2.5)
ALT: 28 U/L (ref 6–29)
AST: 23 U/L (ref 10–30)
Albumin: 4.7 g/dL (ref 3.6–5.1)
Alkaline phosphatase (APISO): 50 U/L (ref 31–125)
BUN: 12 mg/dL (ref 7–25)
CO2: 27 mmol/L (ref 20–32)
Calcium: 9.4 mg/dL (ref 8.6–10.2)
Chloride: 103 mmol/L (ref 98–110)
Creat: 0.74 mg/dL (ref 0.50–0.97)
Globulin: 2.6 g/dL (calc) (ref 1.9–3.7)
Glucose, Bld: 87 mg/dL (ref 65–99)
Potassium: 4.5 mmol/L (ref 3.5–5.3)
Sodium: 139 mmol/L (ref 135–146)
Total Bilirubin: 0.6 mg/dL (ref 0.2–1.2)
Total Protein: 7.3 g/dL (ref 6.1–8.1)
eGFR: 107 mL/min/{1.73_m2} (ref >=60)

## 2023-02-11 LAB — HEMOGLOBIN A1C
Hgb A1c MFr Bld: 5.6 % of total Hgb (ref <5.7)
Mean Plasma Glucose: 114 mg/dL
eAG (mmol/L): 6.3 mmol/L

## 2023-02-11 LAB — LIPID PANEL
Cholesterol: 126 mg/dL (ref <200)
HDL: 41 mg/dL — ABNORMAL LOW (ref >=50)
LDL Cholesterol (Calc): 62 mg/dL (calc)
Non-HDL Cholesterol (Calc): 85 mg/dL (calc) (ref <130)
Total CHOL/HDL Ratio: 3.1 (calc) (ref <5.0)
Triglycerides: 142 mg/dL (ref <150)

## 2023-02-11 LAB — VITAMIN D 25 HYDROXY (VIT D DEFICIENCY, FRACTURES): Vit D, 25-Hydroxy: 30 ng/mL (ref 30–100)

## 2023-02-22 NOTE — Progress Notes (Signed)
Subjective:    Patient ID: Yesenia Wood, female    DOB: 02-24-1986, 37 y.o.   MRN: [id number]  HPI  HPI  Yesenia Wood is a 37 y.o. year old female  who  has a past medical history of ADHD (attention deficit hyperactivity disorder), B12 deficiency, Bell's palsy, Chest pain, Chronic migraine, Deaf, Depression, Dissocial personality disorder, Fatigue, Fatty liver, Fibromyalgia, GERD (gastroesophageal reflux disease), Headache, High triglycerides, Hyperglycemia, Hypertension, Hyperthyroidism, Moderate asthma, Obesity, Paresthesia, and Premature birth.   They are presenting to PM&R clinic as a new patient for pain management evaluation. They were referred by Steele Sizer, MD for treatment of fibromyalgia pain.   Source: Back of head>back>hips, also knees and elbows +R sided numbness/tingling/sensitivity in face, arms and legs; has Hx L Bell's palsy, but no Hx stroke/SCI, does seem to be positional in RUE and ossassionally will get sudden weakness. No LE sudden weakness, no falls.  Inciting incident: none   Red flag symptoms: No red flags for back pain endorsed in Hx or ROS  Medications tried: Topical medications (mild effect) : Tried various creams, Biofreeze works ok "wherever is bothering me" Nsaids (no effect) : Ibuprofen for her teeth in the past; never been on prescription NSAID Tylenol  (no effect) : Hasn't takes for a few weeks Opiates  (no effect) : Was on tramadol before, not sure of s/e, no effect on pain Gabapentin / Lyrica  (no effect) : Lyrica in the past, without benefit. Thinks it caused her to have itching. Gabapentin caused nausea/vomitting.  TCAs  ( allergy ) : No; was on nortryptilline in the past, has allergy, not sure what.  SNRIs  (never tried) : Documented allergy to Duloxetine, states was a rash Other  ( none ) :   Other treatments: PT/OT  (no effect) : Last in January 2024; was not helpful and made her pain worse. Never tried aquatherapy - swimming made it  worse (more sore and tense).  Accupuncture/chiropractor/massage  (no effect) : Chiropractor in high school - lasted 1-2 days; dry needling at some point. Massage and body wraps. 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  Other  (no effect) : Yoga   Goals for pain control: "Walking without any pain"; can walk for 15 minutes. And cycling on a stationary bike - better than walking. 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   +Hx depression, worsening d/t pain. No anxiety. 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 Over the counter, was recommended unisom, works a little.   Prior UDS results: No results found for: "LABOPIA", "COCAINSCRNUR", "LABBENZ", "AMPHETMU", "THCU", "LABBARB"   Pain Inventory Average Pain 4 Pain Right Now 3 My pain is intermittent, constant, sharp, dull, stabbing, tingling, and aching  In the last 24 hours, has pain interfered with the following? General activity 5 Relation with others 0 Enjoyment of life 0 What TIME of day is your pain at its worst? morning , daytime, evening, night, and varies Sleep (in general) Fair  Pain is worse with: walking, standing, and some activites Pain improves with: rest Relief from Meds: 1  how many minutes can you walk? 5-15 ability to climb steps?  yes do you drive?  yes Do you have any goals in this area?  yes  disabled: date disabled 04/06/1986 Do you have any goals in this area?  yes  bladder control problems weakness numbness tremor  tingling trouble walking dizziness anxiety loss of taste or smell  Any changes since last visit?  no  Any changes since last visit?  no    Family History  Adopted: Yes  Family history unknown: Yes   Social History   Socioeconomic History   Marital status: Single    Spouse name: Not on file   Number of children: 0   Years of education: Not on file   Highest education level: Some college,  no degree  Occupational History   Occupation: disability for hearing loss  Tobacco Use   Smoking status: Former    Packs/day: 1.00    Years: 2.00    Additional pack years: 0.00    Total pack years: 2.00    Types: Cigarettes    Start date: 11/28/2004    Quit date: 11/28/2006    Years since quitting: 16.2   Smokeless tobacco: Never  Vaping Use   Vaping Use: Never used  Substance and Sexual Activity   Alcohol use: Yes    Comment: 1 every 3 months   Drug use: No   Sexual activity: Not Currently    Birth control/protection: Pill    Comment: Seasonale  Other Topics Concern   Not on file  Social History Narrative   Lives with her adopted  parents   Hearing loss since birth    Social Determinants of Health   Financial Resource Strain: Low Risk  (02/10/2022)   Overall Financial Resource Strain (CARDIA)    Difficulty of Paying Living Expenses: Not very hard  Food Insecurity: No Food Insecurity (02/10/2022)   Hunger Vital Sign    Worried About Running Out of Food in the Last Year: Never true    Ran Out of Food in the Last Year: Never true  Transportation Needs: No Transportation Needs (02/10/2022)   PRAPARE - Administrator, Civil Service (Medical): No    Lack of Transportation (Non-Medical): No  Physical Activity: Insufficiently Active (02/10/2022)   Exercise Vital Sign    Days of Exercise per Week: 7 days    Minutes of Exercise per Session: 20 min  Stress: No Stress Concern Present (02/10/2022)   [employer] of Occupational Health - Occupational Stress Questionnaire    Feeling of Stress : Only a little  Social Connections: Socially Isolated (02/10/2022)   Social Connection and Isolation Panel [NHANES]    Frequency of Communication with Friends and Family: More than three times a week    Frequency of Social Gatherings with Friends and Family: Never    Attends Religious Services: Never    Database administrator or Organizations: No    Attends Hospital doctor: Never    Marital Status: Never married   Past Surgical History:  Procedure Laterality Date   BARTHOLIN GLAND CYST REMOVAL     COCHLEAR IMPLANT     COCHLEAR IMPLANT REMOVAL     DILATATION AND CURETTAGE/HYSTEROSCOPY WITH MINERVA N/A 03/05/2021   Procedure: DILATATION AND CURETTAGE/HYSTEROSCOPY WITH MINERVA;  Surgeon: Hildred Laser, MD;  Location: [facility];  Service: Gynecology;  Laterality: N/A;   LAPAROSCOPIC TUBAL LIGATION Bilateral 03/26/2018   Procedure: LAPAROSCOPIC TUBAL LIGATION;  Surgeon: Hildred Laser, MD;  Location: [facility];  Service: Gynecology;  Laterality: Bilateral;  with peritoneal biopsies   LAPAROSCOPY     MYRINGOTOMY WITH TUBE PLACEMENT     TUBAL LIGATION  03/26/2018   Past Medical History:  Diagnosis Date   ADHD (attention deficit hyperactivity disorder)    B12  deficiency    Bell's palsy    right sided   Chest pain    a. 05/2015 ETT: elev HR/BP, but no acute ST/T changes; b. 09/2019 Cor CTA: Nl cors, Ca2+ = 0.   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   Fatigue    Fatty liver    Fibromyalgia    GERD (gastroesophageal reflux disease)    Headache    High triglycerides    Hyperglycemia    Hypertension    Hyperthyroidism    Moderate asthma    Obesity    Paresthesia    Premature birth    There were no vitals taken for this visit.  Opioid Risk Score:   Fall Risk Score:  `1  Depression screen PHQ 2/9     02/10/2023   10:14 AM 10/24/2022   11:15 AM 08/19/2022   11:22 AM 08/12/2022    9:51 AM 06/28/2022    1:54 PM 03/08/2022   11:29 AM 02/10/2022   11:19 AM  Depression screen PHQ 2/9  Decreased Interest 1  1 1   2   Down, Depressed, Hopeless 1  1 1   1   PHQ - 2 Score 2  2 2   3   Altered sleeping 3  3 3   3   Tired, decreased energy 3  3 3   2   Change in appetite 1  0 0   1  Feeling bad or failure about yourself  1  0 0   1  Trouble concentrating 1  1 1   2    Moving slowly or fidgety/restless 1  0 0   2  Suicidal thoughts 0  0 0   0  PHQ-9 Score 12  9 9   14   Difficult doing work/chores Somewhat difficult      Somewhat difficult     Information is confidential and restricted. Go to Review Flowsheets to unlock data.    Review of Systems  Respiratory:  Positive for shortness of breath.   Gastrointestinal:  Positive for abdominal pain, constipation and nausea.  Musculoskeletal:  Positive for back pain, gait problem and neck pain.       Pain in both hips.both feet & ankles &  Both shoulders, both wrists & hands Off balance  Neurological:  Positive for dizziness, tremors, weakness and numbness.  Psychiatric/Behavioral:         Anxiety  All other systems reviewed and are negative.      Objective:   Physical Exam   PE: Constitution: Appropriate appearance for age. No apparent distress  +Obese Resp: No respiratory distress. No accessory muscle usage. on RA Cardio: Well perfused appearance. No peripheral edema. Abdomen: Nondistended. Nontender.   Psych: Appropriate mood and affect. Neuro: AAOx4. No apparent cognitive deficits +Deafness  Neurologic Exam:   DTRs: Reflexes were 2+ in bilateral achilles, patella, biceps, BR and triceps. Babinsky: flexor responses b/l.   Hoffmans: negative b/l Sensory exam:  + R elbow Tinel's + Decreased sensation R medial and lateral elbow, 3rs and 5th digits + Decreased sensation R lateral thigh/knee, medial malleolus, and 1st toe  Motor exam: strength 5/5 throughout bilateral upper extremities and bilateral lower extremities Coordination: Fine motor coordination was normal.   Gait: normal    Back Exam:   Inspection: Pelvis was  even.  Lumbar lordotic curvature was  wnl .  There was  no evidence of  scoliosis.  Palpation: Palpatory exam revealed + TTP b/l paraspinals, traps. Shoulders ,PSIS, SI joints, and greater trochanters . There was  no evidence of spasm.   Special/provocative testing:     Slump test: -    Facet loading: + Facet loading LBP b/l, with opposite hip pain   TTP at paraspinals: + (sensitive for facet pain...if no ttp then likely not facet pain)   Pearlean Brownie test: -        Assessment & Plan:   Yesenia Wood is a 37 y.o. year old female  who  has a past medical history of ADHD (attention deficit hyperactivity disorder), B12 deficiency, Bell's palsy, Chest pain, Chronic migraine, Deaf, Depression, Dissocial personality disorder, Fatigue, Fatty liver, Fibromyalgia, GERD (gastroesophageal reflux disease), Headache, High triglycerides, Hyperglycemia, Hypertension, Hyperthyroidism, Moderate asthma, Obesity, Paresthesia, and Premature birth.   They are presenting to PM&R clinic as a new patient for pain management evaluation. They were referred by Alba Cory, MD for treatment of fibromyalgia pain.  Chronic pain syndrome Assessment & Plan: Has poorly controlled pain with limited control; options due to multiple medication allergies.   Primary etiology FM, ? 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 ? Effect of tramadol in the past.   Orders: -     Ambulatory referral to Physical Therapy -     DG Cervical Spine 2 or 3 views; Future -     DG Lumbar Spine 2-3 Views; Future  Fibromyalgia Assessment & Plan: Patient has extensive medication allergies and has failed gabapentin, milnacipran, TCAs, Duloxetine, NSAIDs, lamictal and oxycodone. No allergy to Lyrica but no effect in the past.  I will MyChart message you before next week if there are any medications that are reasonable to try or other adjunctive treatments that I might recommend.  - Discussed possibility of low-dose naltrexone, but will defer at this time as she already suffers from insomnia, high risk of exacerbation of these.  - Could try anti-seizure medication, however allergy of self-injurious thoughts on lamictal in the past so would  need to titrate with caution.   I have referred you to aqua therapy for gentle mobilization to help with strength, endurance, and pain control.  I have also given you a reference list of anti-inflammatory foods to help with pain.  Continue with Tylenol and Biofreeze at this time.  Orders: -     Ambulatory referral to Physical Therapy  MDD (major depressive disorder), recurrent episode, moderate Assessment & Plan: Symptoms poorly controlled due to pain. No current SI.  Extensive allergies to TCAs, SNRIs; was on Zoloft in the past ? Reason for discontinuing. Would benefit from therapies and pain control targetting adjunctive mood.    Right-sided Bell's palsy Assessment & Plan: Documented LEFT facial numbness/tingling in 2015; will inquire with patient as to accuracy   Right sided numbness Assessment & Plan: MRIs brain w/ and w/out, C spine, and L spin 2015-2017 to investigate numbness and weakness on R; no significant findings.   Per record:  Neurology evaluations at [facility] and [facility] unrevealing  2017 - Patient followed up with Dr. Malvin Johns for right arm NCS-Normal Dr. Neale Burly for right leg NCS-Normal.   Patient states she had another EMG recently; however exact results not available on chart review. May be worth repeating if symptoms have changed or worsened.    Insomnia due to mental condition Assessment & Plan:  For sleep, I want you to pick a time to lay  down every night, ideally between 8 and 10 PM.    Starting 1 hour before you want to go to sleep, turn off all television screens, phone screens, tablets, and computers.    Keep the lights low and perform only low stimulation activities, such as reading.    Only use your bedroom for sleep and sex.    You may also take 3 to 5 mg of over-the-counter melatonin approximately 1 hour before bedtime.

## 2023-02-22 NOTE — Progress Notes (Deleted)
Subjective:    Patient ID: Yesenia Wood, female    DOB: July 05, 1986, 37 y.o.   MRN: [id number]  HPI   Pain Inventory Average Pain {NUMBERS; 0-10:5044} Pain Right Now {NUMBERS; 0-10:5044} My pain is {PAIN DESCRIPTION:21022940}  In the last 24 hours, has pain interfered with the following? General activity {NUMBERS; 0-10:5044} Relation with others {NUMBERS; 0-10:5044} Enjoyment of life {NUMBERS; 0-10:5044} What TIME of day is your pain at its worst? {time of day:24191} Sleep (in general) {BHH GOOD/FAIR/POOR:22877}  Pain is worse with: {ACTIVITIES:21022942} Pain improves with: {PAIN IMPROVES BW:[id number] Relief from Meds: {NUMBERS; 0-10:5044}  Family History  Adopted: Yes  Family history unknown: Yes   Social History   Socioeconomic History   Marital status: Single    Spouse name: Not on file   Number of children: 0   Years of education: Not on file   Highest education level: Some college, no degree  Occupational History   Occupation: disability for hearing loss  Tobacco Use   Smoking status: Former    Packs/day: 1.00    Years: 2.00    Additional pack years: 0.00    Total pack years: 2.00    Types: Cigarettes    Start date: 11/28/2004    Quit date: 11/28/2006    Years since quitting: 16.2   Smokeless tobacco: Never  Vaping Use   Vaping Use: Never used  Substance and Sexual Activity   Alcohol use: Yes    Comment: 1 every 3 months   Drug use: No   Sexual activity: Not Currently    Birth control/protection: Pill    Comment: Seasonale  Other Topics Concern   Not on file  Social History Narrative   Lives with her adopted  parents   Hearing loss since birth    Social Determinants of Health   Financial Resource Strain: Low Risk  (02/10/2022)   Overall Financial Resource Strain (CARDIA)    Difficulty of Paying Living Expenses: Not very hard  Food Insecurity: No Food Insecurity (02/10/2022)   Hunger Vital Sign    Worried About Running Out of Food in the Last  Year: Never true    Ran Out of Food in the Last Year: Never true  Transportation Needs: No Transportation Needs (02/10/2022)   PRAPARE - Hydrologist (Medical): No    Lack of Transportation (Non-Medical): No  Physical Activity: Insufficiently Active (02/10/2022)   Exercise Vital Sign    Days of Exercise per Week: 7 days    Minutes of Exercise per Session: 20 min  Stress: No Stress Concern Present (02/10/2022)   [place]    Feeling of Stress : Only a little  Social Connections: Socially Isolated (02/10/2022)   Social Connection and Isolation Panel [NHANES]    Frequency of Communication with Friends and Family: More than three times a week    Frequency of Social Gatherings with Friends and Family: Never    Attends Religious Services: Never    Marine scientist or Organizations: No    Attends Music therapist: Never    Marital Status: Never married   Past Surgical History:  Procedure Laterality Date   BARTHOLIN GLAND CYST REMOVAL     COCHLEAR IMPLANT     [place] N/A 03/05/2021   Procedure: DILATATION AND CURETTAGE/HYSTEROSCOPY WITH MINERVA;  Surgeon: Rubie Maid, MD;  Location: [facility];  Service:  Gynecology;  Laterality: N/A;   LAPAROSCOPIC TUBAL LIGATION Bilateral 03/26/2018   Procedure: LAPAROSCOPIC TUBAL LIGATION;  Surgeon: Rubie Maid, MD;  Location: [facility];  Service: Gynecology;  Laterality: Bilateral;  with peritoneal biopsies   LAPAROSCOPY     MYRINGOTOMY WITH TUBE PLACEMENT     TUBAL LIGATION  03/26/2018   Past Surgical History:  Procedure Laterality Date   BARTHOLIN GLAND CYST REMOVAL     COCHLEAR IMPLANT     COCHLEAR IMPLANT REMOVAL     DILATATION AND CURETTAGE/HYSTEROSCOPY WITH MINERVA N/A 03/05/2021   Procedure: DILATATION AND CURETTAGE/HYSTEROSCOPY WITH MINERVA;  Surgeon: Rubie Maid, MD;  Location: [facility];  Service: Gynecology;  Laterality: N/A;   LAPAROSCOPIC TUBAL LIGATION Bilateral 03/26/2018   Procedure: LAPAROSCOPIC TUBAL LIGATION;  Surgeon: Rubie Maid, MD;  Location: [facility];  Service: Gynecology;  Laterality: Bilateral;  with peritoneal biopsies   LAPAROSCOPY     MYRINGOTOMY WITH TUBE PLACEMENT     TUBAL LIGATION  03/26/2018   Past Medical History:  Diagnosis Date   ADHD (attention deficit hyperactivity disorder)    B12 deficiency    Bell's palsy    right sided   Chest pain    a. 05/2015 ETT: elev HR/BP, but no acute ST/T changes; b. 09/2019 Cor CTA: Nl cors, Ca2+ = 0.   Chronic migraine    Deaf    Depression    Dissocial personality disorder ([place])    three different personalities documented by PCP   Fatigue    Fatty liver    Fibromyalgia    GERD (gastroesophageal reflux disease)    Headache    High triglycerides    Hyperglycemia    Hypertension    Hyperthyroidism    Moderate asthma    Obesity    Paresthesia    Premature birth    There were no vitals taken for this visit.  Opioid Risk Score:   Fall Risk Score:  `1  Depression screen PHQ 2/9     02/10/2023   10:14 AM 10/24/2022   11:15 AM 08/19/2022   11:22 AM 08/12/2022    9:51 AM 06/28/2022    1:54 PM 03/08/2022   11:29 AM 02/10/2022   11:19 AM  Depression screen PHQ 2/9  Decreased Interest 1  1 1   2   Down, Depressed, Hopeless 1  1 1   1   PHQ - 2 Score 2  2 2   3   Altered sleeping 3  3 3   3   Tired, decreased energy 3  3 3   2   Change in appetite 1  0 0   1  Feeling bad or failure about yourself  1  0 0   1  Trouble concentrating 1  1 1   2   Moving slowly or fidgety/restless 1  0 0   2  Suicidal thoughts 0  0 0   0  PHQ-9 Score 12  9 9   14   Difficult doing work/chores Somewhat difficult      Somewhat difficult     Information is confidential and restricted. Go to Review Flowsheets to unlock data.    Review of Systems     Objective:   Physical Exam         Assessment & Plan:

## 2023-02-27 ENCOUNTER — Encounter: Payer: Self-pay | Admitting: Physical Medicine and Rehabilitation

## 2023-02-27 ENCOUNTER — Encounter
Payer: Medicare Other | Attending: Physical Medicine and Rehabilitation | Admitting: Physical Medicine and Rehabilitation

## 2023-02-27 VITALS — BP 106/74 | HR 81 | Ht 63.0 in | Wt 180.0 lb

## 2023-02-27 DIAGNOSIS — F331 Major depressive disorder, recurrent, moderate: Secondary | ICD-10-CM | POA: Diagnosis not present

## 2023-02-27 DIAGNOSIS — R200 Anesthesia of skin: Secondary | ICD-10-CM | POA: Diagnosis not present

## 2023-02-27 DIAGNOSIS — G510 Bell's palsy: Secondary | ICD-10-CM | POA: Insufficient documentation

## 2023-02-27 DIAGNOSIS — M797 Fibromyalgia: Secondary | ICD-10-CM | POA: Insufficient documentation

## 2023-02-27 DIAGNOSIS — F5105 Insomnia due to other mental disorder: Secondary | ICD-10-CM | POA: Insufficient documentation

## 2023-02-27 DIAGNOSIS — G894 Chronic pain syndrome: Secondary | ICD-10-CM | POA: Diagnosis not present

## 2023-02-27 NOTE — Patient Instructions (Signed)
Give me an opportunity this week to look through your records, looking for results of past EMG, pain medications, and other studies that have been performed.  I will MyChart message you before next week if there are any medications that are reasonable to try or other adjunctive treatments that I might recommend.  I have referred you to aqua therapy for gentle mobilization to help with strength, endurance, and pain control.  I have also given you a reference list of anti-inflammatory foods to help with pain.  Continue with Tylenol and Biofreeze at this time.  Follow-up in 2 months.  Have x-rays of your neck and lumbar spine performed before your next visit.

## 2023-03-02 NOTE — Assessment & Plan Note (Addendum)
Patient has extensive medication allergies and has failed gabapentin, milnacipran, TCAs, Duloxetine, NSAIDs, lamictal and oxycodone. No allergy to Lyrica but no effect in the past.  I will MyChart message you before next week if there are any medications that are reasonable to try or other adjunctive treatments that I might recommend.  - Discussed possibility of low-dose naltrexone, but will defer at this time as she already suffers from insomnia, high risk of exacerbation of these.  - Could try anti-seizure medication, however allergy of self-injurious thoughts on lamictal in the past so would need to titrate with caution.   I have referred you to aqua therapy for gentle mobilization to help with strength, endurance, and pain control.  I have also given you a reference list of anti-inflammatory foods to help with pain.  Continue with Tylenol and Biofreeze at this time.

## 2023-03-02 NOTE — Assessment & Plan Note (Addendum)
Documented LEFT facial numbness/tingling in 2015; will inquire with patient as to accuracy

## 2023-03-02 NOTE — Progress Notes (Incomplete)
Subjective:    Patient ID: Yesenia Wood, female    DOB: 02-24-1986, 37 y.o.   MRN: [id number]  HPI  HPI  Yesenia Wood is a 37 y.o. year old female  who  has a past medical history of ADHD (attention deficit hyperactivity disorder), B12 deficiency, Bell's palsy, Chest pain, Chronic migraine, Deaf, Depression, Dissocial personality disorder, Fatigue, Fatty liver, Fibromyalgia, GERD (gastroesophageal reflux disease), Headache, High triglycerides, Hyperglycemia, Hypertension, Hyperthyroidism, Moderate asthma, Obesity, Paresthesia, and Premature birth.   They are presenting to PM&R clinic as a new patient for pain management evaluation. They were referred by Steele Sizer, MD for treatment of fibromyalgia pain.   Source: Back of head>back>hips, also knees and elbows +R sided numbness/tingling/sensitivity in face, arms and legs; has Hx L Bell's palsy, but no Hx stroke/SCI, does seem to be positional in RUE and ossassionally will get sudden weakness. No LE sudden weakness, no falls.  Inciting incident: none   Red flag symptoms: No red flags for back pain endorsed in Hx or ROS  Medications tried: Topical medications (mild effect) : Tried various creams, Biofreeze works ok "wherever is bothering me" Nsaids (no effect) : Ibuprofen for her teeth in the past; never been on prescription NSAID Tylenol  (no effect) : Hasn't takes for a few weeks Opiates  (no effect) : Was on tramadol before, not sure of s/e, no effect on pain Gabapentin / Lyrica  (no effect) : Lyrica in the past, without benefit. Thinks it caused her to have itching. Gabapentin caused nausea/vomitting.  TCAs  ( allergy ) : No; was on nortryptilline in the past, has allergy, not sure what.  SNRIs  (never tried) : Documented allergy to Duloxetine, states was a rash Other  ( none ) :   Other treatments: PT/OT  (no effect) : Last in January 2024; was not helpful and made her pain worse. Never tried aquatherapy - swimming made it  worse (more sore and tense).  Accupuncture/chiropractor/massage  (no effect) : Chiropractor in high school - lasted 1-2 days; dry needling at some point. Massage and body wraps. 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  Other  (no effect) : Yoga   Goals for pain control: "Walking without any pain"; can walk for 15 minutes. And cycling on a stationary bike - better than walking. 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   +Hx depression, worsening d/t pain. No anxiety. 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 Over the counter, was recommended unisom, works a little.   Prior UDS results: No results found for: "LABOPIA", "COCAINSCRNUR", "LABBENZ", "AMPHETMU", "THCU", "LABBARB"   Pain Inventory Average Pain 4 Pain Right Now 3 My pain is intermittent, constant, sharp, dull, stabbing, tingling, and aching  In the last 24 hours, has pain interfered with the following? General activity 5 Relation with others 0 Enjoyment of life 0 What TIME of day is your pain at its worst? morning , daytime, evening, night, and varies Sleep (in general) Fair  Pain is worse with: walking, standing, and some activites Pain improves with: rest Relief from Meds: 1  how many minutes can you walk? 5-15 ability to climb steps?  yes do you drive?  yes Do you have any goals in this area?  yes  disabled: date disabled 04/06/1986 Do you have any goals in this area?  yes  bladder control problems weakness numbness tremor  tingling trouble walking dizziness anxiety loss of taste or smell  Any changes since last visit?  no  Any changes since last visit?  no    Family History  Adopted: Yes  Family history unknown: Yes   Social History   Socioeconomic History  . Marital status: Single    Spouse name: Not on file  . Number of children: 0  . Years of education: Not on file  . Highest education level: Some  college, no degree  Occupational History  . Occupation: disability for hearing loss  Tobacco Use  . Smoking status: Former    Packs/day: 1.00    Years: 2.00    Additional pack years: 0.00    Total pack years: 2.00    Types: Cigarettes    Start date: 11/28/2004    Quit date: 11/28/2006    Years since quitting: 16.2  . Smokeless tobacco: Never  Vaping Use  . Vaping Use: Never used  Substance and Sexual Activity  . Alcohol use: Yes    Comment: 1 every 3 months  . Drug use: No  . Sexual activity: Not Currently    Birth control/protection: Pill    Comment: Seasonale  Other Topics Concern  . Not on file  Social History Narrative   Lives with her adopted  parents   Hearing loss since birth    Social Determinants of Health   Financial Resource Strain: Low Risk  (02/10/2022)   Overall Financial Resource Strain (CARDIA)   . Difficulty of Paying Living Expenses: Not very hard  Food Insecurity: No Food Insecurity (02/10/2022)   Hunger Vital Sign   . Worried About Charity fundraiser in the Last Year: Never true   . Ran Out of Food in the Last Year: Never true  Transportation Needs: No Transportation Needs (02/10/2022)   PRAPARE - Transportation   . Lack of Transportation (Medical): No   . Lack of Transportation (Non-Medical): No  Physical Activity: Insufficiently Active (02/10/2022)   Exercise Vital Sign   . Days of Exercise per Week: 7 days   . Minutes of Exercise per Session: 20 min  Stress: No Stress Concern Present (02/10/2022)   [place]   . Feeling of Stress : Only a little  Social Connections: Socially Isolated (02/10/2022)   Social Connection and Isolation Panel [NHANES]   . Frequency of Communication with Friends and Family: More than three times a week   . Frequency of Social Gatherings with Friends and Family: Never   . Attends Religious Services: Never   . Active Member of Clubs or Organizations: No   .  Attends Archivist Meetings: Never   . Marital Status: Never married   Past Surgical History:  Procedure Laterality Date  . BARTHOLIN GLAND CYST REMOVAL    . COCHLEAR IMPLANT    . COCHLEAR IMPLANT REMOVAL    . DILATATION AND CURETTAGE/HYSTEROSCOPY WITH MINERVA N/A 03/05/2021   Procedure: DILATATION AND CURETTAGE/HYSTEROSCOPY WITH MINERVA;  Surgeon: Rubie Maid, MD;  Location: [facility];  Service: Gynecology;  Laterality: N/A;  . LAPAROSCOPIC TUBAL LIGATION Bilateral 03/26/2018   Procedure: LAPAROSCOPIC TUBAL LIGATION;  Surgeon: Rubie Maid, MD;  Location: [facility];  Service: Gynecology;  Laterality: Bilateral;  with peritoneal biopsies  . LAPAROSCOPY    . MYRINGOTOMY WITH TUBE PLACEMENT    . TUBAL LIGATION  03/26/2018   Past Medical History:  Diagnosis Date  . ADHD (attention deficit hyperactivity disorder)   . B12  deficiency   . Bell's palsy    right sided  . Chest pain    a. 05/2015 ETT: elev HR/BP, but no acute ST/T changes; b. 09/2019 Cor CTA: Nl cors, Ca2+ = 0.  . Chronic migraine   . Deaf   . Depression   . 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 Fatigue   . Fatty liver   . Fibromyalgia   . GERD (gastroesophageal reflux disease)   . Headache   . High triglycerides   . Hyperglycemia   . Hypertension   . Hyperthyroidism   . Moderate asthma   . Obesity   . Paresthesia   . Premature birth    There were no vitals taken for this visit.  Opioid Risk Score:   Fall Risk Score:  `1  Depression screen PHQ 2/9     02/10/2023   10:14 AM 10/24/2022   11:15 AM 08/19/2022   11:22 AM 08/12/2022    9:51 AM 06/28/2022    1:54 PM 03/08/2022   11:29 AM 02/10/2022   11:19 AM  Depression screen PHQ 2/9  Decreased Interest 1  1 1   2   Down, Depressed, Hopeless 1  1 1   1   PHQ - 2 Score 2  2 2   3   Altered sleeping 3  3 3   3   Tired, decreased energy 3  3 3   2   Change in appetite 1  0 0   1  Feeling bad or failure  about yourself  1  0 0   1  Trouble concentrating 1  1 1   2   Moving slowly or fidgety/restless 1  0 0   2  Suicidal thoughts 0  0 0   0  PHQ-9 Score 12  9 9   14   Difficult doing work/chores Somewhat difficult      Somewhat difficult     Information is confidential and restricted. Go to Review Flowsheets to unlock data.    Review of Systems  Respiratory:  Positive for shortness of breath.   Gastrointestinal:  Positive for abdominal pain, constipation and nausea.  Musculoskeletal:  Positive for back pain, gait problem and neck pain.       Pain in both hips.both feet & ankles &  Both shoulders, both wrists & hands Off balance  Neurological:  Positive for dizziness, tremors, weakness and numbness.  Psychiatric/Behavioral:         Anxiety  All other systems reviewed and are negative.      Objective:   Physical Exam   PE: Constitution: Appropriate appearance for age. No apparent distress  +Obese Resp: No respiratory distress. No accessory muscle usage. on RA Cardio: Well perfused appearance. No peripheral edema. Abdomen: Nondistended. Nontender.   Psych: Appropriate mood and affect. Neuro: AAOx4. No apparent cognitive deficits +Deafness  Neurologic Exam:   DTRs: Reflexes were 2+ in bilateral achilles, patella, biceps, BR and triceps. Babinsky: flexor responses b/l.   Hoffmans: negative b/l Sensory exam:  + R elbow Tinel's + Decreased sensation R medial and lateral elbow, 3rs and 5th digits + Decreased sensation R lateral thigh/knee, medial malleolus, and 1st toe  Motor exam: strength 5/5 throughout bilateral upper extremities and bilateral lower extremities Coordination: Fine motor coordination was normal.   Gait: normal    Back Exam:   Inspection: Pelvis was  even.  Lumbar lordotic curvature was  wnl .  There was  no evidence of  scoliosis.  Palpation: Palpatory exam revealed + TTP b/l paraspinals, traps. Shoulders ,PSIS, SI joints, and greater trochanters . There was   no evidence of spasm.   Special/provocative testing:    Slump test: -    Facet loading: + Facet loading LBP b/l, with opposite hip pain   TTP at paraspinals: + (sensitive for facet pain...if no ttp then likely not facet pain)   Corky Sox test: -        Assessment & Plan:   Yesenia Wood is a 37 y.o. year old female  who  has a past medical history of ADHD (attention deficit hyperactivity disorder), B12 deficiency, Bell's palsy, Chest pain, Chronic migraine, Deaf, Depression, Dissocial personality disorder, Fatigue, Fatty liver, Fibromyalgia, GERD (gastroesophageal reflux disease), Headache, High triglycerides, Hyperglycemia, Hypertension, Hyperthyroidism, Moderate asthma, Obesity, Paresthesia, and Premature birth.   They are presenting to PM&R clinic as a new patient for pain management evaluation. They were referred by Steele Sizer, MD for treatment of fibromyalgia pain.  Chronic pain syndrome -     Ambulatory referral to Physical Therapy -     DG Cervical Spine 2 or 3 views; Future -     DG Lumbar Spine 2-3 Views; Future  Fibromyalgia -     Ambulatory referral to Physical Therapy  MDD (major depressive disorder), recurrent episode, moderate  Right-sided Bell's palsy

## 2023-03-03 ENCOUNTER — Ambulatory Visit (INDEPENDENT_AMBULATORY_CARE_PROVIDER_SITE_OTHER): Payer: Medicare Other | Admitting: Licensed Clinical Social Worker

## 2023-03-03 DIAGNOSIS — F4010 Social phobia, unspecified: Secondary | ICD-10-CM

## 2023-03-03 DIAGNOSIS — F331 Major depressive disorder, recurrent, moderate: Secondary | ICD-10-CM

## 2023-03-03 DIAGNOSIS — G894 Chronic pain syndrome: Secondary | ICD-10-CM | POA: Insufficient documentation

## 2023-03-03 NOTE — Progress Notes (Unsigned)
Virtual Visit via Audio Note  I connected with Yesenia Wood on 03/03/23 at 10:00 AM EDT by an audio enabled telemedicine application and verified that I am speaking with the correct person using two identifiers.Sign language interpreter was on audio line for assistance.   Location: Patient: home Provider: [facility]   I discussed the limitations of evaluation and management by telemedicine and the availability of in person appointments. The patient expressed understanding and agreed to proceed.   I discussed the assessment and treatment plan with the patient. The patient was provided an opportunity to ask questions and all were answered. The patient agreed with the plan and demonstrated an understanding of the instructions.   The patient was advised to call back or seek an in-person evaluation if the symptoms worsen or if the condition fails to improve as anticipated.  I provided 30 minutes of non-face-to-face time during this encounter.   Yesenia Nienaber R Felipe Paluch, LCSW   THERAPIST PROGRESS NOTE  Session Time: 10-1030a              Participation Level: Active  Behavioral Response: NAAlertAnxious  Type of Therapy: Individual Therapy  Treatment Goals addressed:  Develop healthy thinking patterns and beliefs about self, others, and the world that lead to the alleviation and help prevent the relapse of depression per self report 3 out of 5 sessions documented    Learn and implement coping skills that result in a reduction of anxiety and worry, and improve daily functioning per pt report 3 out of 5 sessions documented   Interventions: CBT; supportive; narrative   Summary: Yesenia Wood is a 37 y.o. female who presents with continuing symptoms related to depression and social anxiety diagnoses.  Patient reports that she feels her depression symptoms have increased since her last session.  Patient denies any suicidal or self-harm thoughts at time of  session.  Allowed pt to explore and express thoughts and feelings associated with recent life situations and external stressors.  Patient reports that she is feeling more sad, she is feeling depressed recently.  Patient reports that she is not engaging in any physical activity patient reports that she is not engaging socially with many people, only her parents, the patient reports that she continues to experience complex medical concerns, including chronic pain, and patient continues to have erratic sleep patterns.  Patient reports that she has tried to engage in the activities that she feels could be helpful, but she is finding them not helpful.  Patient feels that she is at the point where medication management of symptoms may be needed.  Discussed referral to psychiatrist--discussed with patient that clinician was not sure a psychiatrist is currently accepting new patients, but I would make the referral and see.  Continued recommendations are as follows: self care behaviors, positive social engagements, focusing on overall work/home/life balance, and focusing on positive physical and emotional wellness.   Suicidal/Homicidal: No  Therapist Response: Pt is continuing to apply interventions learned in session into daily life situations. Personal growth and progress is continuing at time of session.Treatment to continue as indicated.   Plan: Return again in 4 weeks.  Diagnosis:  Encounter Diagnoses  Name Primary?   MDD (major depressive disorder), recurrent episode, moderate Yes   Social anxiety disorder     Collaboration of Care: Other pt requested referral back to psychiatrist of record, Dr. Jomarie Longs  Patient/Guardian was advised Release of Information must be obtained prior to any record release in order to  collaborate their care with an outside provider. Patient/Guardian was advised if they have not already done so to contact the registration department to sign all necessary forms  in order for us to release information regarding their care.   Consent: Patient/Guardian gives verbal consent for treatment and assignment of benefits for services provided during this visit. Patient/Guardian expressed understanding and agreed to proceed.   Active     Anxiety     LTG: Reduce overall frequency, intensity, and duration of the anxiety so that daily functioning is not impaired per pt self report 3 out of 5 sessions documented.   (Progressing)     Start:  12/13/22    Expected End:  06/28/23         STG: Learn and implement coping skills that result in a reduction of anxiety and worry, and improve daily functioning per pt report 3 out of 5 sessions documented  (Progressing)     Start:  12/13/22    Expected End:  06/28/23         Work with patient individually to identify the major components of a recent episode of anxiety: physical symptoms, major thoughts and images, and major behaviors they experienced     Start:  12/13/22       Intervention Note     Reviewed with patient during session.          Perform motivational interviewing regarding use of tools     Start:  12/13/22       Intervention Note     Reviewed with patient during session.            OP Depression     LTG: Decrease depressive symptoms and improve levels of effective functioning-pt reports a decrease in overall depression symptoms 3 out of 5 sessions documented.  (Not Progressing)     Start:  12/13/22    Expected End:  06/28/23         STG: Develop healthy thinking patterns and beliefs about self, others, and the world that lead to the alleviation and help prevent the relapse of depression per self report 3 out of 5 sessions documented.   (Not Progressing)     Start:  12/13/22    Expected End:  06/28/23         Work with Yesenia Wood to identify the major components of a recent episode of depression: physical symptoms, major thoughts and images, and major behaviors they experienced      Start:  12/13/22       Intervention Note     Reviewed with patient during session.          Therapist will review PLEASE Skills (Treat Physical Illness, Balance Eating, Avoid Mood-Altering Substances, Balance Sleep and Get Exercise) with patient     Start:  12/13/22       Intervention Note     Reviewed with patient during session.             Ernest HaberChristina R Tyshia Fenter, LCSW 03/03/2023

## 2023-03-03 NOTE — Assessment & Plan Note (Addendum)
MRIs brain w/ and w/out, C spine, and L spin 2015-2017 to investigate numbness and weakness on R; no significant findings.   Per record:  Neurology evaluations at [facility] and [facility] unrevealing  2017 - Patient followed up with Dr. Malvin Johns for right arm NCS-Normal Dr. Neale Burly for right leg NCS-Normal.   Patient states she had another EMG recently; however exact results not available on chart review. May be worth repeating if symptoms have changed or worsened.

## 2023-03-03 NOTE — Assessment & Plan Note (Signed)
Has poorly controlled pain with limited control; options due to multiple medication allergies.   Primary etiology FM, ? 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 ? Effect of tramadol in the past.

## 2023-03-03 NOTE — Assessment & Plan Note (Signed)
Symptoms poorly controlled due to pain. No current SI.  Extensive allergies to TCAs, SNRIs; was on Zoloft in the past ? Reason for discontinuing. Would benefit from therapies and pain control targetting adjunctive mood.

## 2023-03-03 NOTE — Assessment & Plan Note (Signed)
For sleep, I want you to pick a time to lay down every night, ideally between 8 and 10 PM.    Starting 1 hour before you want to go to sleep, turn off all television screens, phone screens, tablets, and computers.    Keep the lights low and perform only low stimulation activities, such as reading.    Only use your bedroom for sleep and sex.    You may also take 3 to 5 mg of over-the-counter melatonin approximately 1 hour before bedtime.    

## 2023-03-10 ENCOUNTER — Ambulatory Visit
Payer: Medicare Other | Attending: Physical Medicine and Rehabilitation | Admitting: Rehabilitative and Restorative Service Providers"

## 2023-03-10 ENCOUNTER — Other Ambulatory Visit: Payer: Self-pay

## 2023-03-10 ENCOUNTER — Encounter: Payer: Self-pay | Admitting: Rehabilitative and Restorative Service Providers"

## 2023-03-10 DIAGNOSIS — R2689 Other abnormalities of gait and mobility: Secondary | ICD-10-CM | POA: Diagnosis not present

## 2023-03-10 DIAGNOSIS — M797 Fibromyalgia: Secondary | ICD-10-CM | POA: Insufficient documentation

## 2023-03-10 DIAGNOSIS — M6281 Muscle weakness (generalized): Secondary | ICD-10-CM | POA: Diagnosis not present

## 2023-03-10 DIAGNOSIS — G894 Chronic pain syndrome: Secondary | ICD-10-CM | POA: Insufficient documentation

## 2023-03-10 DIAGNOSIS — R42 Dizziness and giddiness: Secondary | ICD-10-CM | POA: Insufficient documentation

## 2023-03-10 DIAGNOSIS — R252 Cramp and spasm: Secondary | ICD-10-CM | POA: Insufficient documentation

## 2023-03-10 DIAGNOSIS — R52 Pain, unspecified: Secondary | ICD-10-CM

## 2023-03-10 NOTE — Therapy (Signed)
OUTPATIENT PHYSICAL THERAPY LOWER EXTREMITY EVALUATION   Patient Name: Yesenia Wood MRN: [id number] DOB:1986-09-26, 37 y.o., female Today's Date: 03/10/2023  END OF SESSION:  PT End of Session - 03/10/23 0941     Visit Number 1    Date for PT Re-Evaluation 05/05/23    Authorization Type Medicare A    Progress Note Due on Visit 10    PT Start Time 0930    PT Stop Time 1010    PT Time Calculation (min) 40 min    Activity Tolerance Patient tolerated treatment well    Behavior During Therapy WFL for tasks assessed/performed             Past Medical History:  Diagnosis Date   ADHD (attention deficit hyperactivity disorder)    B12 deficiency    Bell's palsy    right sided   Chest pain    a. 05/2015 ETT: elev HR/BP, but no acute ST/T changes; b. 09/2019 Cor CTA: Nl cors, Ca2+ = 0.   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   Fatigue    Fatty liver    Fibromyalgia    GERD (gastroesophageal reflux disease)    Headache    High triglycerides    Hyperglycemia    Hypertension    Hyperthyroidism    Moderate asthma    Obesity    Paresthesia    Premature birth    Past Surgical History:  Procedure Laterality Date   BARTHOLIN GLAND CYST REMOVAL     COCHLEAR IMPLANT     COCHLEAR IMPLANT REMOVAL     DILATATION AND CURETTAGE/HYSTEROSCOPY WITH MINERVA N/A 03/05/2021   Procedure: DILATATION AND CURETTAGE/HYSTEROSCOPY WITH MINERVA;  Surgeon: Hildred Laser, MD;  Location: [facility];  Service: Gynecology;  Laterality: N/A;   LAPAROSCOPIC TUBAL LIGATION Bilateral 03/26/2018   Procedure: LAPAROSCOPIC TUBAL LIGATION;  Surgeon: Hildred Laser, MD;  Location: [facility];  Service: Gynecology;  Laterality: Bilateral;  with peritoneal biopsies   LAPAROSCOPY     MYRINGOTOMY WITH TUBE PLACEMENT     TUBAL LIGATION  03/26/2018   Patient Active Problem List   Diagnosis Date Noted   Chronic pain syndrome  03/03/2023   Nausea 08/12/2022   Dyslipidemia 02/10/2022   Vitamin D deficiency 02/10/2022   Positive ANA (antinuclear antibody) 12/20/2021   Fibromyalgia 12/20/2021   Sleep difficulties 04/30/2020   Right sided numbness 12/26/2019   RLS (restless legs syndrome) 09/30/2019   MDD (major depressive disorder), recurrent episode, moderate 07/17/2019   Social anxiety disorder 07/17/2019   Muteness 09/10/2018   OSA (obstructive sleep apnea) 10/10/2016   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   Dissocial personality disorder 06/07/2016   Sleep disturbance 05/10/2016   Chronic migraine without aura 03/29/2016   Palpitations 06/09/2015   Deaf 05/16/2015   B12 deficiency 05/16/2015   Cochlear implant status 05/16/2015   Fatty infiltration of liver 05/16/2015   Gastro-esophageal reflux disease without esophagitis 05/16/2015   Generalized headache 05/16/2015   Right-sided Bell's palsy 05/16/2015   Hypertriglyceridemia 05/16/2015   Irregular menstrual cycle 05/16/2015   Overweight 05/16/2015   Paresthesia 05/16/2015   Disorder of labyrinth 05/16/2015   Migraine 08/05/2013   Allergic rhinitis 07/01/2013   Obesity (BMI 30-39.9) 07/01/2013   Bartholin gland cyst 09/16/2008    PCP: Alba Cory, MD  REFERRING PROVIDER: Angelina Sheriff, DO  REFERRING DIAG: G89.4 (ICD-10-CM) - Chronic pain syndrome M79.7 (  ICD-10-CM) - Fibromyalgia  THERAPY DIAG:  Other abnormalities of gait and mobility - Plan: PT plan of care cert/re-cert  Muscle weakness (generalized) - Plan: PT plan of care cert/re-cert  Cramp and spasm - Plan: PT plan of care cert/re-cert  Dizziness and giddiness - Plan: PT plan of care cert/re-cert  Generalized pain - Plan: PT plan of care cert/re-cert  Rationale for Evaluation and Treatment: Rehabilitation  ONSET DATE: Has been around since Middle School, but has gotten worse over the past years  SUBJECTIVE:   SUBJECTIVE STATEMENT: Pt reported around 71-44  years of age, she started having pain.  Reports that the pain got worse and has led to some depression.  States pain in neck, back, and hips. Patient reports that she gets dizzy 1-2x/week.  Denies dizziness with lying down and rolling over, but states she sometimes gets dizzy with looking up.  Sign Language Interpreter present throughout  PERTINENT HISTORY: Fibromyalgia, restless leg syndrome, deaf with cochlear implants (patient does not utilize), chronic migraines, chronic pain syndrome, Hx of right sided Bell's Palsy PAIN:  Are you having pain? Yes: NPRS scale: 4-5/10 Pain location: cervical, hips, back Pain description: crampy, sometimes sharp Aggravating factors: unknown Relieving factors: unknown  PRECAUTIONS: None  WEIGHT BEARING RESTRICTIONS: No  FALLS:  Has patient fallen in last 6 months? Yes. Number of falls 2-3x/week, states that she typically loses balance on left side  LIVING ENVIRONMENT: Lives with: lives with their family Lives in: House/apartment Stairs:  2 level home, her bedroom on the main level. Has following equipment at home: Ramped entry  PLOF: Independent and Leisure: learning new things, crafting, knitting, crochet, reading books  PATIENT GOALS: To be able to walk more than 15 minutes without back pain.  NEXT MD VISIT: PCP on Mar 30, 2023  OBJECTIVE:   DIAGNOSTIC FINDINGS: n/a  PATIENT SURVEYS:  03/10/2023:  LEFS 32 / 80 = 40.0 %  COGNITION: Overall cognitive status: Within functional limits for tasks assessed     SENSATION: Reports numbness and tingling on right side of the body, numbness and tingling on left hand   MUSCLE LENGTH: Hamstrings: tightness noted  POSTURE: rounded shoulders, forward head, and anterior pelvic tilt  PALPATION: Tenderness to palpation  LOWER EXTREMITY ROM:  03/10/2023:  WFL  LOWER EXTREMITY MMT:  03/10/2023:  Right hip strength 4/5, Left hip 4 to 4+/5 Right UE strength of 4/5 Left UE strength WFL  LOWER  EXTREMITY SPECIAL TESTS:  4/12/024:  slump test positive (right > left)  FUNCTIONAL TESTS:  5 times sit to stand: 19.74 seconds with hands pushing up from thighs and reports of increased back pain Single Leg Stance:  greater than 30 seconds on each side  GAIT: Distance walked: in clinic Assistive device utilized: None Level of assistance: Complete Independence Comments: Pt reports that in general, she can walk between 5-10 minutes before she starts having increased pain.   TODAY'S TREATMENT:  DATE: 03/10/2023  Reviewed HEP, educated on dry needling   PATIENT EDUCATION:  Education details: Issued HEP Person educated: Patient Education method: Explanation, Facilities manager, and Handouts Education comprehension: verbalized understanding  HOME EXERCISE PROGRAM: Access Code: [email or web address] URL: [email or web address] Date: 03/10/2023 Prepared by: Clydie Braun Shakir Petrosino  Exercises - Supine Lower Trunk Rotation  - 1 x daily - 7 x weekly - 1 sets - 10 reps - Clamshell  - 1 x daily - 7 x weekly - 2 sets - 10 reps - Supine Posterior Pelvic Tilt  - 1 x daily - 7 x weekly - 2 sets - 10 reps - Seated Hamstring Stretch  - 1 x daily - 7 x weekly - 1 sets - 2 reps - 20 sec hold  ASSESSMENT:  CLINICAL IMPRESSION: Patient is a 37 y.o. female who was seen today for physical therapy evaluation and treatment for chronic pain syndrome and fibromyalgia. Patient reports that she has done physical therapy in the past, but has not had great results and some of the exercises caused increased pain.  Patient reports a history of multiple falls with reported dizziness a few times per week.  Patient presents with right UE and LE weakness, decreased balance with multiple falls, and decreased postural awareness.  Patient would benefit from skilled PT to address her functional impairments to  allow her to decrease pain with functional mobility and increase ambulation distance.  OBJECTIVE IMPAIRMENTS: decreased balance, difficulty walking, decreased strength, dizziness, postural dysfunction, and pain.   ACTIVITY LIMITATIONS: sitting, squatting, stairs, and locomotion level  PARTICIPATION LIMITATIONS: community activity  PERSONAL FACTORS: Past/current experiences, Time since onset of injury/illness/exacerbation, and 3+ comorbidities: chronic pain syndrome, fibromyalgia, chronic migraines  are also affecting patient's functional outcome.   REHAB POTENTIAL: Good  CLINICAL DECISION MAKING: Evolving/moderate complexity  EVALUATION COMPLEXITY: Moderate   GOALS: Goals reviewed with patient? Yes  SHORT TERM GOALS: Target date: 03/31/2023  Patient will be independent with initial HEP. Baseline: Goal status: INITIAL  2.  Patient will participate in a 6 min walk test to obtain a baseline functional measurement. Baseline:  Goal status: INITIAL   LONG TERM GOALS: Target date: 05/05/2023  Patient will be independent with advanced HEP. Baseline:  Goal status: INITIAL  2.  Patient will report ability to be able to walk for at least 20 minutes without reports of increased pain to allow her to go out in the community. Baseline:  Goal status: INITIAL  3.  Patient will report at least a 50% decrease in the frequency of falls and dizziness to decrease risk of injury. Baseline:  Goal status: INITIAL  4.  Patient will increase UE/LE strength to at least 4+/5 grossly throughout to improve functional strength and ability to complete tasks. Baseline:  Goal status: INITIAL  5.  Patient will increase Lower Extremity Functional Scale to at least 55% to demonstrate improvements in functional mobility. Baseline: 40% Goal status: INITIAL  6.  Patient will improve time on 5 times sit to/from stand to less than 15 seconds to demonstrate improved power production. Baseline: 19.74 sec with  hands pressing up from thighs Goal status: INITIAL   PLAN:  PT FREQUENCY: 1-2x/week  PT DURATION: 8 weeks  PLANNED INTERVENTIONS: Therapeutic exercises, Therapeutic activity, Neuromuscular re-education, Balance training, Gait training, Patient/Family education, Self Care, Joint mobilization, Joint manipulation, Stair training, Vestibular training, Canalith repositioning, Aquatic Therapy, Dry Needling, Electrical stimulation, Spinal manipulation, Spinal mobilization, Cryotherapy, Moist heat, Taping, Traction, Ultrasound, Ionotophoresis /ml Dexamethasone, Manual therapy, and Re-evaluation  PLAN FOR NEXT  SESSION: Assess and progress HEP as indicated, strengthening, core stability, vestibular assessment if indicated   Azalie Harbeck, PT 03/10/2023, 11:50 AM   [facility] [address] [place], [place] [address] Phone # [phone] Fax [phone]

## 2023-03-10 NOTE — Patient Instructions (Signed)

## 2023-03-13 ENCOUNTER — Encounter: Payer: Self-pay | Admitting: Physical Medicine and Rehabilitation

## 2023-03-15 ENCOUNTER — Ambulatory Visit
Admission: RE | Admit: 2023-03-15 | Discharge: 2023-03-15 | Disposition: A | Discharge status: Home / Self Care | Payer: Medicare Other | Source: Ambulatory Visit | Attending: Physical Medicine and Rehabilitation | Admitting: Physical Medicine and Rehabilitation

## 2023-03-15 ENCOUNTER — Ambulatory Visit: Payer: Medicare Other | Admitting: Rehabilitative and Restorative Service Providers"

## 2023-03-15 ENCOUNTER — Encounter: Payer: Self-pay | Admitting: Rehabilitative and Restorative Service Providers"

## 2023-03-15 DIAGNOSIS — R2689 Other abnormalities of gait and mobility: Secondary | ICD-10-CM | POA: Diagnosis not present

## 2023-03-15 DIAGNOSIS — R42 Dizziness and giddiness: Secondary | ICD-10-CM

## 2023-03-15 DIAGNOSIS — G894 Chronic pain syndrome: Secondary | ICD-10-CM

## 2023-03-15 DIAGNOSIS — M5416 Radiculopathy, lumbar region: Secondary | ICD-10-CM | POA: Diagnosis not present

## 2023-03-15 DIAGNOSIS — M5412 Radiculopathy, cervical region: Secondary | ICD-10-CM | POA: Diagnosis not present

## 2023-03-15 DIAGNOSIS — R252 Cramp and spasm: Secondary | ICD-10-CM | POA: Diagnosis not present

## 2023-03-15 DIAGNOSIS — R52 Pain, unspecified: Secondary | ICD-10-CM | POA: Diagnosis not present

## 2023-03-15 DIAGNOSIS — M6281 Muscle weakness (generalized): Secondary | ICD-10-CM

## 2023-03-15 DIAGNOSIS — M542 Cervicalgia: Secondary | ICD-10-CM | POA: Diagnosis not present

## 2023-03-15 NOTE — Therapy (Signed)
OUTPATIENT PHYSICAL THERAPY TREATMENT NOTE   Patient Name: Yesenia Wood MRN: [id number] DOB:06/26/1986, 37 y.o., female Today's Date: 03/15/2023  END OF SESSION:  PT End of Session - 03/15/23 1207     Visit Number 2    Date for PT Re-Evaluation 05/05/23    Authorization Type Medicare A    Progress Note Due on Visit 10    PT Start Time 1145    PT Stop Time 1225    PT Time Calculation (min) 40 min    Activity Tolerance Patient tolerated treatment well    Behavior During Therapy WFL for tasks assessed/performed             Past Medical History:  Diagnosis Date   ADHD (attention deficit hyperactivity disorder)    B12 deficiency    Bell's palsy    right sided   Chest pain    a. 05/2015 ETT: elev HR/BP, but no acute ST/T changes; b. 09/2019 Cor CTA: Nl cors, Ca2+ = 0.   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   Fatigue    Fatty liver    Fibromyalgia    GERD (gastroesophageal reflux disease)    Headache    High triglycerides    Hyperglycemia    Hypertension    Hyperthyroidism    Moderate asthma    Obesity    Paresthesia    Premature birth    Past Surgical History:  Procedure Laterality Date   BARTHOLIN GLAND CYST REMOVAL     COCHLEAR IMPLANT     COCHLEAR IMPLANT REMOVAL     DILATATION AND CURETTAGE/HYSTEROSCOPY WITH MINERVA N/A 03/05/2021   Procedure: DILATATION AND CURETTAGE/HYSTEROSCOPY WITH MINERVA;  Surgeon: Hildred Laser, MD;  Location: [facility];  Service: Gynecology;  Laterality: N/A;   LAPAROSCOPIC TUBAL LIGATION Bilateral 03/26/2018   Procedure: LAPAROSCOPIC TUBAL LIGATION;  Surgeon: Hildred Laser, MD;  Location: [facility];  Service: Gynecology;  Laterality: Bilateral;  with peritoneal biopsies   LAPAROSCOPY     MYRINGOTOMY WITH TUBE PLACEMENT     TUBAL LIGATION  03/26/2018   Patient Active Problem List   Diagnosis Date Noted   Chronic pain syndrome 03/03/2023    Nausea 08/12/2022   Dyslipidemia 02/10/2022   Vitamin D deficiency 02/10/2022   Positive ANA (antinuclear antibody) 12/20/2021   Fibromyalgia 12/20/2021   Sleep difficulties 04/30/2020   Right sided numbness 12/26/2019   RLS (restless legs syndrome) 09/30/2019   MDD (major depressive disorder), recurrent episode, moderate 07/17/2019   Social anxiety disorder 07/17/2019   Muteness 09/10/2018   OSA (obstructive sleep apnea) 10/10/2016   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   Dissocial personality disorder 06/07/2016   Sleep disturbance 05/10/2016   Chronic migraine without aura 03/29/2016   Palpitations 06/09/2015   Deaf 05/16/2015   B12 deficiency 05/16/2015   Cochlear implant status 05/16/2015   Fatty infiltration of liver 05/16/2015   Gastro-esophageal reflux disease without esophagitis 05/16/2015   Generalized headache 05/16/2015   Right-sided Bell's palsy 05/16/2015   Hypertriglyceridemia 05/16/2015   Irregular menstrual cycle 05/16/2015   Overweight 05/16/2015   Paresthesia 05/16/2015   Disorder of labyrinth 05/16/2015   Migraine 08/05/2013   Allergic rhinitis 07/01/2013   Obesity (BMI 30-39.9) 07/01/2013   Bartholin gland cyst 09/16/2008    PCP: Alba Cory, MD  REFERRING PROVIDER: Angelina Sheriff, DO  REFERRING DIAG: G89.4 (ICD-10-CM) - Chronic pain syndrome M79.7 (ICD-10-CM) -  Fibromyalgia  THERAPY DIAG:  Other abnormalities of gait and mobility  Muscle weakness (generalized)  Cramp and spasm  Dizziness and giddiness  Generalized pain  Rationale for Evaluation and Treatment: Rehabilitation  ONSET DATE: Has been around since Middle School, but has gotten worse over the past years  SUBJECTIVE:   SUBJECTIVE STATEMENT: Pt reported that she had a lot of pain yesterday, but states that today is better.  Reports that some of the exercises have been challenging.  States that she goes for imaging of her neck later today.  Sign Language  Interpreter present throughout  PERTINENT HISTORY: Fibromyalgia, restless leg syndrome, deaf with cochlear implants (patient does not utilize), chronic migraines, chronic pain syndrome, Hx of right sided Bell's Palsy PAIN:  Are you having pain? Yes: NPRS scale: 4-5/10 Pain location: cervical, hips, back Pain description: crampy, sometimes sharp Aggravating factors: unknown Relieving factors: unknown  PRECAUTIONS: None  WEIGHT BEARING RESTRICTIONS: No  FALLS:  Has patient fallen in last 6 months? Yes. Number of falls 2-3x/week, states that she typically loses balance on left side  LIVING ENVIRONMENT: Lives with: lives with their family Lives in: House/apartment Stairs:  2 level home, her bedroom on the main level. Has following equipment at home: Ramped entry  PLOF: Independent and Leisure: learning new things, crafting, knitting, crochet, reading books  PATIENT GOALS: To be able to walk more than 15 minutes without back pain.  NEXT MD VISIT: PCP on Mar 30, 2023  OBJECTIVE:   DIAGNOSTIC FINDINGS: n/a  PATIENT SURVEYS:  03/10/2023:  LEFS 32 / 80 = 40.0 %  COGNITION: Overall cognitive status: Within functional limits for tasks assessed     SENSATION: Reports numbness and tingling on right side of the body, numbness and tingling on left hand   MUSCLE LENGTH: Hamstrings: tightness noted  POSTURE: rounded shoulders, forward head, and anterior pelvic tilt  PALPATION: Tenderness to palpation  LOWER EXTREMITY ROM:  03/10/2023:  WFL  LOWER EXTREMITY MMT:  03/10/2023:  Right hip strength 4/5, Left hip 4 to 4+/5 Right UE strength of 4/5 Left UE strength WFL  LOWER EXTREMITY SPECIAL TESTS:  4/12/024:  slump test positive (right > left)  FUNCTIONAL TESTS:  Eval: 5 times sit to stand: 19.74 seconds with hands pushing up from thighs and reports of increased back pain Single Leg Stance:  greater than 30 seconds on each side  03/15/2023: 6 minute walk test:  338 ft  with patient stopping test early secondary to increased pain.  GAIT: Distance walked: in clinic Assistive device utilized: None Level of assistance: Complete Independence Comments: Pt reports that in general, she can walk between 5-10 minutes before she starts having increased pain.   TODAY'S TREATMENT:                                                                                                                               DATE: 03/10/2023 Nustep level 3 x3 minutes with PT present  to discuss status Seated hamstring stretch with unilateral LE outstretched onto PT mat x20 seconds bilat Supine hamstring stretch with strap x20 sec bilat Supine posterior pelvic tilt x10 with cuing for decreased range of motion secondary to pain Supine TA contraction with hands pushing through thighs x10 Lower trunk rotation x10 bilat (with cuing to stay within decreased pain range) Supine bent knee fallouts/clamshell x10 Gilberto Better to bilateral sides.  Patient with reports of increased dizziness to left side, so proceeded with treatment with Epley Manuever    DATE: 03/10/2023  Reviewed HEP, educated on dry needling   PATIENT EDUCATION:  Education details: Issued HEP Person educated: Patient Education method: Programmer, multimedia, Facilities manager, and Handouts Education comprehension: verbalized understanding  HOME EXERCISE PROGRAM: Access Code: [email or web address] URL: [email or web address] Date: 03/15/2023 Prepared by: Clydie Braun Nailah Luepke  Exercises - Supine Hamstring Stretch with Strap  - 1 x daily - 7 x weekly - 1 sets - 2 reps - 20 sec hold - Supine Lower Trunk Rotation  - 1 x daily - 7 x weekly - 1 sets - 10 reps - Supine Transversus Abdominis Bracing - Hands on Stomach  - 1 x daily - 7 x weekly - 1-2 sets - 10 reps - Bent Knee Fallouts  - 1 x daily - 7 x weekly - 1-2 sets - 10 reps - Walking Tandem Stance  - 1 x daily - 7 x weekly - 3 sets - 10 reps  ASSESSMENT:  CLINICAL IMPRESSION: Kyrstyn  presents to skilled PT reporting that she continues to have dizziness and states that she feels that she has decreased balance and at times loses her balance to the right side. Patient with dizziness reported with Gilberto Better (left greater than right), but did not note any nystagmus.  However, due to reports of dizziness, proceeded with canalith repositioning with Epley maneuver for left side.  Patient provided with updated HEP, as these exercises did not cause increased pain like previous ones did.  During 6 minute walk test, patient with noted fatigue and increased pain and required one standing recovery period, then 1 seated recovery period and ended ambulation with less than 3 minutes remaining in assessment.  Patient with one imbalance noted where she required external support with slight sway towards left side.  Patient continues to require skilled PT to progress towards goal related activities.  OBJECTIVE IMPAIRMENTS: decreased balance, difficulty walking, decreased strength, dizziness, postural dysfunction, and pain.   ACTIVITY LIMITATIONS: sitting, squatting, stairs, and locomotion level  PARTICIPATION LIMITATIONS: community activity  PERSONAL FACTORS: Past/current experiences, Time since onset of injury/illness/exacerbation, and 3+ comorbidities: chronic pain syndrome, fibromyalgia, chronic migraines  are also affecting patient's functional outcome.   REHAB POTENTIAL: Good  CLINICAL DECISION MAKING: Evolving/moderate complexity  EVALUATION COMPLEXITY: Moderate   GOALS: Goals reviewed with patient? Yes  SHORT TERM GOALS: Target date: 03/31/2023  Patient will be independent with initial HEP. Baseline: Goal status: IN PROGRESS  2.  Patient will participate in a 6 min walk test to obtain a baseline functional measurement. Baseline:  Goal status: MET on 03/15/2023   LONG TERM GOALS: Target date: 05/05/2023  Patient will be independent with advanced HEP. Baseline:  Goal status:  INITIAL  2.  Patient will report ability to be able to walk for at least 20 minutes without reports of increased pain to allow her to go out in the community. Baseline:  Goal status: INITIAL  3.  Patient will report at least a 50% decrease in the frequency of falls and dizziness  to decrease risk of injury. Baseline:  Goal status: INITIAL  4.  Patient will increase UE/LE strength to at least 4+/5 grossly throughout to improve functional strength and ability to complete tasks. Baseline:  Goal status: INITIAL  5.  Patient will increase Lower Extremity Functional Scale to at least 55% to demonstrate improvements in functional mobility. Baseline: 40% Goal status: INITIAL  6.  Patient will improve time on 5 times sit to/from stand to less than 15 seconds to demonstrate improved power production. Baseline: 19.74 sec with hands pressing up from thighs Goal status: INITIAL   PLAN:  PT FREQUENCY: 1-2x/week  PT DURATION: 8 weeks  PLANNED INTERVENTIONS: Therapeutic exercises, Therapeutic activity, Neuromuscular re-education, Balance training, Gait training, Patient/Family education, Self Care, Joint mobilization, Joint manipulation, Stair training, Vestibular training, Canalith repositioning, Aquatic Therapy, Dry Needling, Electrical stimulation, Spinal manipulation, Spinal mobilization, Cryotherapy, Moist heat, Taping, Traction, Ultrasound, Ionotophoresis /ml Dexamethasone, Manual therapy, and Re-evaluation  PLAN FOR NEXT SESSION: Assess and progress HEP as indicated, strengthening, core stability, vestibular assessment if indicated   Yanelli Zapanta, PT 03/15/2023, 1:37 PM   [facility] [address], [address] [place], [place] [address] Phone # [phone] Fax [phone]

## 2023-03-21 ENCOUNTER — Encounter: Payer: Self-pay | Admitting: Physical Medicine and Rehabilitation

## 2023-03-21 ENCOUNTER — Ambulatory Visit: Payer: Medicare Other | Admitting: Physical Therapy

## 2023-03-21 ENCOUNTER — Encounter: Payer: Self-pay | Admitting: Physical Therapy

## 2023-03-21 DIAGNOSIS — G894 Chronic pain syndrome: Secondary | ICD-10-CM | POA: Diagnosis not present

## 2023-03-21 DIAGNOSIS — R252 Cramp and spasm: Secondary | ICD-10-CM | POA: Diagnosis not present

## 2023-03-21 DIAGNOSIS — M6281 Muscle weakness (generalized): Secondary | ICD-10-CM | POA: Diagnosis not present

## 2023-03-21 DIAGNOSIS — R42 Dizziness and giddiness: Secondary | ICD-10-CM

## 2023-03-21 DIAGNOSIS — R2689 Other abnormalities of gait and mobility: Secondary | ICD-10-CM

## 2023-03-21 DIAGNOSIS — R52 Pain, unspecified: Secondary | ICD-10-CM

## 2023-03-21 DIAGNOSIS — M797 Fibromyalgia: Secondary | ICD-10-CM

## 2023-03-21 NOTE — Therapy (Signed)
OUTPATIENT PHYSICAL THERAPY TREATMENT NOTE   Patient Name: Yesenia Wood MRN: [id number] DOB:Oct 07, 1986, 37 y.o., female Today's Date: 03/21/2023  END OF SESSION:  PT End of Session - 03/21/23 1543     Visit Number 3    Date for PT Re-Evaluation 05/05/23    Authorization Type Medicare A    Authorization - Visit Number 3    Progress Note Due on Visit 10    PT Start Time 1446    PT Stop Time 1530    PT Time Calculation (min) 44 min    Activity Tolerance Patient tolerated treatment well;Patient limited by pain    Behavior During Therapy [facility] for tasks assessed/performed              Past Medical History:  Diagnosis Date   ADHD (attention deficit hyperactivity disorder)    B12 deficiency    Bell's palsy    right sided   Chest pain    a. 05/2015 ETT: elev HR/BP, but no acute ST/T changes; b. 09/2019 Cor CTA: Nl cors, Ca2+ = 0.   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   Fatigue    Fatty liver    Fibromyalgia    GERD (gastroesophageal reflux disease)    Headache    High triglycerides    Hyperglycemia    Hypertension    Hyperthyroidism    Moderate asthma    Obesity    Paresthesia    Premature birth    Past Surgical History:  Procedure Laterality Date   BARTHOLIN GLAND CYST REMOVAL     COCHLEAR IMPLANT     COCHLEAR IMPLANT REMOVAL     DILATATION AND CURETTAGE/HYSTEROSCOPY WITH MINERVA N/A 03/05/2021   Procedure: DILATATION AND CURETTAGE/HYSTEROSCOPY WITH MINERVA;  Surgeon: Hildred Laser, MD;  Location: [facility];  Service: Gynecology;  Laterality: N/A;   LAPAROSCOPIC TUBAL LIGATION Bilateral 03/26/2018   Procedure: LAPAROSCOPIC TUBAL LIGATION;  Surgeon: Hildred Laser, MD;  Location: [facility];  Service: Gynecology;  Laterality: Bilateral;  with peritoneal biopsies   LAPAROSCOPY     MYRINGOTOMY WITH TUBE PLACEMENT     TUBAL LIGATION  03/26/2018   Patient Active Problem List    Diagnosis Date Noted   Chronic pain syndrome 03/03/2023   Nausea 08/12/2022   Dyslipidemia 02/10/2022   Vitamin D deficiency 02/10/2022   Positive ANA (antinuclear antibody) 12/20/2021   Fibromyalgia 12/20/2021   Sleep difficulties 04/30/2020   Right sided numbness 12/26/2019   RLS (restless legs syndrome) 09/30/2019   MDD (major depressive disorder), recurrent episode, moderate 07/17/2019   Social anxiety disorder 07/17/2019   Muteness 09/10/2018   OSA (obstructive sleep apnea) 10/10/2016   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   Dissocial personality disorder 06/07/2016   Sleep disturbance 05/10/2016   Chronic migraine without aura 03/29/2016   Palpitations 06/09/2015   Deaf 05/16/2015   B12 deficiency 05/16/2015   Cochlear implant status 05/16/2015   Fatty infiltration of liver 05/16/2015   Gastro-esophageal reflux disease without esophagitis 05/16/2015   Generalized headache 05/16/2015   Right-sided Bell's palsy 05/16/2015   Hypertriglyceridemia 05/16/2015   Irregular menstrual cycle 05/16/2015   Overweight 05/16/2015   Paresthesia 05/16/2015   Disorder of labyrinth 05/16/2015   Migraine 08/05/2013   Allergic rhinitis 07/01/2013   Obesity (BMI 30-39.9) 07/01/2013   Bartholin gland cyst 09/16/2008    PCP: Alba Cory, MD  REFERRING PROVIDER: Angelina Sheriff,  DO  REFERRING DIAG: G89.4 (ICD-10-CM) - Chronic pain syndrome M79.7 (ICD-10-CM) - Fibromyalgia  THERAPY DIAG:  Other abnormalities of gait and mobility  Muscle weakness (generalized)  Cramp and spasm  Dizziness and giddiness  Generalized pain  Rationale for Evaluation and Treatment: Rehabilitation  ONSET DATE: Has been around since Middle School, but has gotten worse over the past years  SUBJECTIVE:   SUBJECTIVE STATEMENT: Pt reported that she is still having pain.   Sign Language Interpreter present throughout  PERTINENT HISTORY: Fibromyalgia, restless leg syndrome, deaf with  cochlear implants (patient does not utilize), chronic migraines, chronic pain syndrome, Hx of right sided Bell's Palsy PAIN:  Are you having pain? Yes: NPRS scale: 4-5/10 Pain location: cervical, hips, back Pain description: crampy, sometimes sharp Aggravating factors: unknown Relieving factors: unknown  PRECAUTIONS: None  WEIGHT BEARING RESTRICTIONS: No  FALLS:  Has patient fallen in last 6 months? Yes. Number of falls 2-3x/week, states that she typically loses balance on left side  LIVING ENVIRONMENT: Lives with: lives with their family Lives in: House/apartment Stairs:  2 level home, her bedroom on the main level. Has following equipment at home: Ramped entry  PLOF: Independent and Leisure: learning new things, crafting, knitting, crochet, reading books  PATIENT GOALS: To be able to walk more than 15 minutes without back pain.  NEXT MD VISIT: PCP on Mar 30, 2023  OBJECTIVE:   DIAGNOSTIC FINDINGS: n/a  PATIENT SURVEYS:  03/10/2023:  LEFS 32 / 80 = 40.0 %  COGNITION: Overall cognitive status: Within functional limits for tasks assessed     SENSATION: Reports numbness and tingling on right side of the body, numbness and tingling on left hand   MUSCLE LENGTH: Hamstrings: tightness noted  POSTURE: rounded shoulders, forward head, and anterior pelvic tilt  PALPATION: Tenderness to palpation  LOWER EXTREMITY ROM:  03/10/2023:  WFL  LOWER EXTREMITY MMT:  03/10/2023:  Right hip strength 4/5, Left hip 4 to 4+/5 Right UE strength of 4/5 Left UE strength WFL  LOWER EXTREMITY SPECIAL TESTS:  4/12/024:  slump test positive (right > left)  FUNCTIONAL TESTS:  Eval: 5 times sit to stand: 19.74 seconds with hands pushing up from thighs and reports of increased back pain Single Leg Stance:  greater than 30 seconds on each side  03/15/2023: 6 minute walk test:  338 ft with patient stopping test early secondary to increased pain.  GAIT: Distance walked: in  clinic Assistive device utilized: None Level of assistance: Complete Independence Comments: Pt reports that in general, she can walk between 5-10 minutes before she starts having increased pain.   TODAY'S TREATMENT:                                                                                                                               DATE:  03/21/23 Warm-up walking: slow pace x3 min around clinic  Supine hamstring stretch with strap 2x20 sec each Supine figure 4 position x20 sec  each Attempted seated butterfly stretch, but pt didn't feel this in the correct place Attempted standing adductor stretch x20 sec each side Supine bridge x8 reps (+) increase in low back pain Supine abdominal bracing with tactile/verbal cuing to increase understanding of proper form Supine abdominal bracing with bent knee march x10 reps Discussed ways to decrease back pain with cat/cow stretch at home; use of tennis ball for soft tissue mobilization  03/10/2023 Nustep level 3 x3 minutes with PT present to discuss status Seated hamstring stretch with unilateral LE outstretched onto PT mat x20 seconds bilat Supine hamstring stretch with strap x20 sec bilat Supine posterior pelvic tilt x10 with cuing for decreased range of motion secondary to pain Supine TA contraction with hands pushing through thighs x10 Lower trunk rotation x10 bilat (with cuing to stay within decreased pain range) Supine bent knee fallouts/clamshell x10 Gilberto Better to bilateral sides.  Patient with reports of increased dizziness to left side, so proceeded with treatment with Epley Manuever    DATE: 03/10/2023  Reviewed HEP, educated on dry needling   PATIENT EDUCATION:  Education details: Issued HEP Person educated: Patient Education method: Programmer, multimedia, Facilities manager, and Handouts Education comprehension: verbalized understanding  HOME EXERCISE PROGRAM: Access Code: [email or web address] URL: [email or web address] Date:  03/21/2023 Prepared by: [facility]  Exercises - Supine Hamstring Stretch with Strap  - 1 x daily - 7 x weekly - 1 sets - 2 reps - 20 sec hold - Supine Lower Trunk Rotation  - 1 x daily - 7 x weekly - 1 sets - 10 reps - Walking Tandem Stance  - 1 x daily - 7 x weekly - 3 sets - 10 reps - Hooklying Sequential Leg March and Lower  - 1 x daily - 7 x weekly - 3 sets - 10 reps - Cat Cow  - 1 x daily - 7 x weekly - 3 sets - 10 reps  ASSESSMENT:  CLINICAL IMPRESSION: Pt continues to have pain throughout the day. Session began with walking 3 minutes before pt requested to stop secondary to low back pain. Had difficulty finding adequate stretch in the LEs during stretching portion of the session. Pt demonstrates overactive lumbar paraspinals and had difficulty initially activating her lower abdominals. This was added to her HEP. PT discussed the benefits of a daily walking goal of 5 minutes to ensure she is making progress towards her overall goal of 15 minutes.   OBJECTIVE IMPAIRMENTS: decreased balance, difficulty walking, decreased strength, dizziness, postural dysfunction, and pain.   ACTIVITY LIMITATIONS: sitting, squatting, stairs, and locomotion level  PARTICIPATION LIMITATIONS: community activity  PERSONAL FACTORS: Past/current experiences, Time since onset of injury/illness/exacerbation, and 3+ comorbidities: chronic pain syndrome, fibromyalgia, chronic migraines  are also affecting patient's functional outcome.   REHAB POTENTIAL: Good  CLINICAL DECISION MAKING: Evolving/moderate complexity  EVALUATION COMPLEXITY: Moderate   GOALS: Goals reviewed with patient? Yes  SHORT TERM GOALS: Target date: 03/31/2023  Patient will be independent with initial HEP. Baseline: Goal status: IN PROGRESS  2.  Patient will participate in a 6 min walk test to obtain a baseline functional measurement. Baseline:  Goal status: MET on 03/15/2023   LONG  TERM GOALS: Target date: 05/05/2023  Patient will be independent with advanced HEP. Baseline:  Goal status: INITIAL  2.  Patient will report ability to be able to walk for at least 20 minutes without reports of increased pain to allow her to go out in the community. Baseline:  Goal  status: INITIAL  3.  Patient will report at least a 50% decrease in the frequency of falls and dizziness to decrease risk of injury. Baseline:  Goal status: INITIAL  4.  Patient will increase UE/LE strength to at least 4+/5 grossly throughout to improve functional strength and ability to complete tasks. Baseline:  Goal status: INITIAL  5.  Patient will increase Lower Extremity Functional Scale to at least 55% to demonstrate improvements in functional mobility. Baseline: 40% Goal status: INITIAL  6.  Patient will improve time on 5 times sit to/from stand to less than 15 seconds to demonstrate improved power production. Baseline: 19.74 sec with hands pressing up from thighs Goal status: INITIAL   PLAN:  PT FREQUENCY: 1-2x/week  PT DURATION: 8 weeks  PLANNED INTERVENTIONS: Therapeutic exercises, Therapeutic activity, Neuromuscular re-education, Balance training, Gait training, Patient/Family education, Self Care, Joint mobilization, Joint manipulation, Stair training, Vestibular training, Canalith repositioning, Aquatic Therapy, Dry Needling, Electrical stimulation, Spinal manipulation, Spinal mobilization, Cryotherapy, Moist heat, Taping, Traction, Ultrasound, Ionotophoresis 4mg /ml Dexamethasone, Manual therapy, and Re-evaluation  PLAN FOR NEXT SESSION: progress abdominal strength, core stability, vestibular assessment if indicated   3:44 PM,03/21/23 Donita Brooks PT, DPT [facility] [place]  [phone]

## 2023-03-22 DIAGNOSIS — M542 Cervicalgia: Secondary | ICD-10-CM | POA: Diagnosis not present

## 2023-03-22 DIAGNOSIS — M791 Myalgia, unspecified site: Secondary | ICD-10-CM | POA: Diagnosis not present

## 2023-03-22 DIAGNOSIS — G43719 Chronic migraine without aura, intractable, without status migrainosus: Secondary | ICD-10-CM | POA: Diagnosis not present

## 2023-03-22 DIAGNOSIS — G518 Other disorders of facial nerve: Secondary | ICD-10-CM | POA: Diagnosis not present

## 2023-03-23 ENCOUNTER — Ambulatory Visit: Payer: Medicare Other | Admitting: Rehabilitative and Restorative Service Providers"

## 2023-03-27 ENCOUNTER — Ambulatory Visit: Payer: Medicare Other | Admitting: Rehabilitative and Restorative Service Providers"

## 2023-03-29 NOTE — Progress Notes (Unsigned)
Name: Yesenia Wood   MRN: [id number]    DOB: Aug 21, 1986   Date:03/30/2023       Progress Note  Subjective  Chief Complaint  Chief Complaint  Patient presents with   Depression   She came in with an interpreter Yesenia Wood  HPI  MDD: she states she is always in pain and PT made pain worse and made her feel discouraged. She had multiple allergy reactions.   Chronic pain: she was seen by Dr. Shearon Stalls PMR and was referred to PT, she states she went for one session and the pain increased after the therapy and caused increase in pain. Explained she can go to PT to learn what to do and implement it at home a little bit per day. She states having pain causes her mood to get worse. Explained water therapy is much better for her since she has PMR  Patient Active Problem List   Diagnosis Date Noted   Chronic pain syndrome 03/03/2023   Nausea 08/12/2022   Dyslipidemia 02/10/2022   Vitamin D deficiency 02/10/2022   Positive ANA (antinuclear antibody) 12/20/2021   Fibromyalgia 12/20/2021   Sleep difficulties 04/30/2020   Right sided numbness 12/26/2019   RLS (restless legs syndrome) 09/30/2019   MDD (major depressive disorder), recurrent episode, moderate (HCC) 07/17/2019   Social anxiety disorder 07/17/2019   Muteness 09/10/2018   OSA (obstructive sleep apnea) 10/10/2016   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   Dissocial personality disorder (HCC) 06/07/2016   Sleep disturbance 05/10/2016   Chronic migraine without aura 03/29/2016   Palpitations 06/09/2015   Deaf 05/16/2015   B12 deficiency 05/16/2015   Cochlear implant status 05/16/2015   Fatty infiltration of liver 05/16/2015   Gastro-esophageal reflux disease without esophagitis 05/16/2015   Generalized headache 05/16/2015   Right-sided Bell's palsy 05/16/2015   Hypertriglyceridemia 05/16/2015   Irregular menstrual cycle 05/16/2015   Overweight 05/16/2015   Paresthesia 05/16/2015   Disorder of labyrinth 05/16/2015   Migraine  08/05/2013   Allergic rhinitis 07/01/2013   Obesity (BMI 30-39.9) 07/01/2013   Bartholin gland cyst 09/16/2008    Past Surgical History:  Procedure Laterality Date   BARTHOLIN GLAND CYST REMOVAL     COCHLEAR IMPLANT     COCHLEAR IMPLANT REMOVAL     DILATATION AND CURETTAGE/HYSTEROSCOPY WITH MINERVA N/A 03/05/2021   Procedure: DILATATION AND CURETTAGE/HYSTEROSCOPY WITH MINERVA;  Surgeon: Hildred Laser, MD;  Location: [facility] OR[facility];  Service: Gynecology;  Laterality: N/A;   LAPAROSCOPIC TUBAL LIGATION Bilateral 03/26/2018   Procedure: LAPAROSCOPIC TUBAL LIGATION;  Surgeon: Hildred Laser, MD;  Location: [facility] OR[facility];  Service: Gynecology;  Laterality: Bilateral;  with peritoneal biopsies   LAPAROSCOPY     MYRINGOTOMY WITH TUBE PLACEMENT     TUBAL LIGATION  03/26/2018    Family History  Adopted: Yes  Family history unknown: Yes    Social History   Tobacco Use   Smoking status: Former    Packs/day: 1.00    Years: 2.00    Additional pack years: 0.00    Total pack years: 2.00    Types: Cigarettes    Start date: 11/28/2004    Quit date: 11/28/2006    Years since quitting: 16.3   Smokeless tobacco: Never  Substance Use Topics   Alcohol use: Yes    Comment: 1 every 3 months     Current Outpatient Medications:    acetaminophen (TYLENOL) 500 MG tablet, Take 500 mg by mouth every 6 (six) hours as needed., Disp: , Rfl:  BOTOX 100 units SOLR injection, Inject 100 Units into the muscle every 3 (three) months., Disp: , Rfl:    Cholecalciferol (VITAMIN D) 50 MCG (2000 UT) CAPS, Take 1 capsule by mouth daily., Disp: , Rfl:    Cyanocobalamin (VITAMIN B-12) 500 MCG SUBL, Place 1 tablet (500 mcg total) under the tongue once a week., Disp: 30 tablet, Rfl: 0   doxylamine, Sleep, (UNISOM) 25 MG tablet, Take 25 mg by mouth at bedtime as needed., Disp: , Rfl:    famotidine (PEPCID) 40 MG tablet, Take 1 tablet (40 mg total) by mouth daily. For reflux and nausea  in am's, Disp: 90 tablet, Rfl: 1   ferrous  sulfate 325 (65 FE) MG tablet, Take 325 mg by mouth daily with breakfast., Disp: , Rfl:    FIBER ADULT GUMMIES PO, Take 15 mg by mouth daily., Disp: , Rfl:    folic acid (FOLVITE) 800 MCG tablet, Take 800 mcg by mouth daily. 800 mcg, Disp: , Rfl:    ibuprofen (ADVIL) 200 MG tablet, Take 200 mg by mouth every 6 (six) hours as needed., Disp: , Rfl:    loratadine (CLARITIN) 10 MG tablet, TAKE 1 TABLET BY MOUTH EVERY DAY, Disp: 90 tablet, Rfl: 1   metoprolol succinate (TOPROL-XL) 50 MG 24 hr tablet, Take 1 tablet (50 mg total) by mouth daily., Disp: 90 tablet, Rfl: 3   NON FORMULARY, CBD soft gel as needed., Disp: , Rfl:    polyethylene glycol powder (GLYCOLAX/MIRALAX) 17 GM/SCOOP powder, Take 17 g by mouth daily., Disp: 3350 g, Rfl: 1   rosuvastatin (CRESTOR) 20 MG tablet, Take 1 tablet (20 mg total) by mouth daily., Disp: 90 tablet, Rfl: 1   triamcinolone cream (KENALOG) 0.1 %, Apply 1 Application topically 2 (two) times daily., Disp: 453.6 g, Rfl: 0  Allergies  Allergen Reactions   Budesonide-Formoterol Fumarate Anaphylaxis and Other (See Comments)    Other reaction(s): Other (See Comments) chest pain Chest pain chest pain   Astelin [Azelastine]     Tingling, numbness, nausea   Gabapentin Nausea And Vomiting   Nortriptyline Other (See Comments)    Other reaction(s): Other (See Comments)   Fluogen [Influenza Virus Vaccine]     rash   Lamictal [Lamotrigine]     Self injurious thoughts   Milnacipran Other (See Comments)    Pressure in eyes   Tegaderm Ag Mesh [Silver] Itching   Diclofenac Other (See Comments)    Other reaction(s): Other (See Comments)   Duloxetine Hcl Other (See Comments)    Other reaction(s): Other (See Comments) Other reaction(s): unknown   Montelukast Sodium Other (See Comments)    Other reaction(s): wheezing/sob   Mupirocin Other (See Comments)   Naproxen Other (See Comments) and Rash    Other reaction(s): rash/itching   Naproxen Sodium Rash    mild rash    Other Rash   Oxycodone Hcl Rash   Sulfa Antibiotics Hives, Rash and Itching    I personally reviewed active problem list, medication list, allergies with the patient/caregiver today.   ROS  Ten systems reviewed and is negative except as mentioned in HPI   Objective  Vitals:   03/30/23 0958  BP: 122/76  Pulse: 97  Resp: 16  SpO2: 98%  Weight: 183 lb (83 kg)  Height: 5\' 3"  (1.6 m)    Body mass index is 32.42 kg/m.  Physical Exam  Constitutional: Patient appears well-developed and well-nourished. Obese  No distress.  HEENT: head atraumatic, normocephalic, pupils equal and reactive to  light, neck supple Cardiovascular: Normal rate, regular rhythm and normal heart sounds.  No murmur heard. No BLE edema. Pulmonary/Chest: Effort normal and breath sounds normal. No respiratory distress. Abdominal: Soft.  There is no tenderness. Muscular Skeletal: Trigger point positive  Psychiatric: Patient has a normal mood and affect. behavior is normal. Judgment and thought content normal.   Recent Results (from the past 2160 hour(s))  Lipid panel     Status: Abnormal   Collection Time: 02/10/23 11:21 AM  Result Value Ref Range   Cholesterol 126 <200 mg/dL   HDL 41 (L) > OR = 50 mg/dL   Triglycerides 409 <811 mg/dL   LDL Cholesterol (Calc) 62 mg/dL (calc)    Comment: Reference range: <100 . Desirable range <100 mg/dL for primary prevention;   <70 mg/dL for patients with CHD or diabetic patients  with > or = 2 CHD risk factors. Marland Kitchen LDL-C is now calculated using the Martin-Hopkins  calculation, which is a validated novel method providing  better accuracy than the Friedewald equation in the  estimation of LDL-C.  Horald Pollen et al. Lenox Ahr. 9147;829(56): 2061-2068  (http://education.[facility].com/faq/FAQ164)    Total CHOL/HDL Ratio 3.1 <5.0 (calc)   Non-HDL Cholesterol (Calc) 85 <213 mg/dL (calc)    Comment: For patients with diabetes plus 1 major ASCVD risk  factor, treating to a  non-HDL-C goal of <100 mg/dL  (LDL-C of <08 mg/dL) is considered a therapeutic  option.   CBC with Differential/Platelet     Status: None   Collection Time: 02/10/23 11:21 AM  Result Value Ref Range   WBC 7.7 3.8 - 10.8 Thousand/uL   RBC 4.93 3.80 - 5.10 Million/uL   Hemoglobin 14.1 11.7 - 15.5 g/dL   HCT 65.7 84.6 - 96.2 %   MCV 87.8 80.0 - 100.0 fL   MCH 28.6 27.0 - 33.0 pg   MCHC 32.6 32.0 - 36.0 g/dL   RDW 95.2 84.1 - 32.4 %   Platelets 301 140 - 400 Thousand/uL   MPV 10.1 7.5 - 12.5 fL   Neutro Abs 4,019 1,500 - 7,800 cells/uL   Lymphs Abs 2,780 850 - 3,900 cells/uL   Absolute Monocytes 809 200 - 950 cells/uL   Eosinophils Absolute 31 15 - 500 cells/uL   Basophils Absolute 62 0 - 200 cells/uL   Neutrophils Relative % 52.2 %   Total Lymphocyte 36.1 %   Monocytes Relative 10.5 %   Eosinophils Relative 0.4 %   Basophils Relative 0.8 %  COMPLETE METABOLIC PANEL WITH GFR     Status: None   Collection Time: 02/10/23 11:21 AM  Result Value Ref Range   Glucose, Bld 87 65 - 99 mg/dL    Comment: .            Fasting reference interval .    BUN 12 7 - 25 mg/dL   Creat 4.01 0.27 - 2.53 mg/dL   eGFR 664 > OR = 60 QI/HKV/4.25Z5   BUN/Creatinine Ratio SEE NOTE: 6 - 22 (calc)    Comment:    Not Reported: BUN and Creatinine are within    reference range. .    Sodium 139 135 - 146 mmol/L   Potassium 4.5 3.5 - 5.3 mmol/L   Chloride 103 98 - 110 mmol/L   CO2 27 20 - 32 mmol/L   Calcium 9.4 8.6 - 10.2 mg/dL   Total Protein 7.3 6.1 - 8.1 g/dL   Albumin 4.7 3.6 - 5.1 g/dL   Globulin 2.6 1.9 - 3.7 g/dL (  calc)   AG Ratio 1.8 1.0 - 2.5 (calc)   Total Bilirubin 0.6 0.2 - 1.2 mg/dL   Alkaline phosphatase (APISO) 50 31 - 125 U/L   AST 23 10 - 30 U/L   ALT 28 6 - 29 U/L  Hemoglobin A1c     Status: None   Collection Time: 02/10/23 11:21 AM  Result Value Ref Range   Hgb A1c MFr Bld 5.6 <5.7 % of total Hgb    Comment: For the purpose of screening for the presence  of diabetes: . <5.7%       Consistent with the absence of diabetes 5.7-6.4%    Consistent with increased risk for diabetes             (prediabetes) > or =6.5%  Consistent with diabetes . This assay result is consistent with a decreased risk of diabetes. . Currently, no consensus exists regarding use of hemoglobin A1c for diagnosis of diabetes in children. . According to American Diabetes Association (ADA) guidelines, hemoglobin A1c <7.0% represents optimal control in non-pregnant diabetic patients. Different metrics may apply to specific patient populations.  Standards of Medical Care in Diabetes(ADA). .    Mean Plasma Glucose 114 mg/dL   eAG (mmol/L) 6.3 mmol/L    Comment: . This test was performed on the Roche cobas c503 platform. Effective 09/05/22, a change in test platforms from the Abbott Architect to the Roche cobas c503 may have shifted HbA1c results compared to historical results. Based on laboratory validation testing conducted at [facility], the Roche platform relative to the Abbott platform had an average increase in HbA1c value of < or = 0.3%. This difference is within accepted  variability established by the [facility]. Note that not all individuals will have had a shift in their results and direct comparisons between historical and current results for testing conducted on different platforms is not recommended.   VITAMIN D 25 Hydroxy (Vit-D Deficiency, Fractures)     Status: None   Collection Time: 02/10/23 11:21 AM  Result Value Ref Range   Vit D, 25-Hydroxy 30 30 - 100 ng/mL    Comment: Vitamin D Status         25-OH Vitamin D: . Deficiency:                    <20 ng/mL Insufficiency:             20 - 29 ng/mL Optimal:                 > or = 30 ng/mL . For 25-OH Vitamin D testing on patients on  D2-supplementation and patients for whom quantitation  of D2 and D3 fractions is required, the [facility](TM) 25-OH VIT  D, (D2,D3), LC/MS/MS is recommended: order  code 16109 (patients >78yrs). . See Note 1 . Note 1 . For additional information, please refer to  http://education.[facility].com/faq/FAQ199  (This link is being provided for informational/ educational purposes only.)   B12 and Folate Panel     Status: None   Collection Time: 02/10/23 11:21 AM  Result Value Ref Range   Vitamin B-12 475 200 - 1,100 pg/mL   Folate 19.6 ng/mL    Comment:                            Reference Range  Low:           <3.4                            Borderline:    3.4-5.4                            Normal:        >5.4 .      PHQ2/9:    03/30/2023   10:01 AM 03/03/2023   10:13 AM 02/27/2023   10:30 AM 02/10/2023   10:14 AM 10/24/2022   11:15 AM  Depression screen PHQ 2/9  Decreased Interest 2  1 1    Down, Depressed, Hopeless 3  1 1    PHQ - 2 Score 5  2 2    Altered sleeping 3  2 3    Tired, decreased energy 2  3 3    Change in appetite 1  0 1   Feeling bad or failure about yourself  0  1 1   Trouble concentrating 1  1 1    Moving slowly or fidgety/restless 0  1 1   Suicidal thoughts 0  0 0   PHQ-9 Score 12  10 12    Difficult doing work/chores    Somewhat difficult      Information is confidential and restricted. Go to Review Flowsheets to unlock data.    phq 9 is positive   Fall Risk:    03/30/2023    9:57 AM 02/10/2023   10:02 AM 08/19/2022   11:22 AM 08/12/2022    9:51 AM 02/10/2022   11:19 AM  Fall Risk   Falls in the past year? 1 0 1 1 1   Number falls in past yr: 0  0 0 0  Injury with Fall? 0  0 0 0  Risk for fall due to : No Fall Risks No Fall Risks No Fall Risks No Fall Risks   Follow up Falls prevention discussed Falls prevention discussed;Education provided;Falls evaluation completed Falls prevention discussed Falls prevention discussed      Functional Status Survey: Is the patient deaf or have difficulty hearing?: Yes Does the patient have difficulty  seeing, even when wearing glasses/contacts?: Yes Does the patient have difficulty concentrating, remembering, or making decisions?: Yes Does the patient have difficulty walking or climbing stairs?: Yes Does the patient have difficulty dressing or bathing?: Yes Does the patient have difficulty doing errands alone such as visiting a doctor's office or shopping?: Yes    Assessment & Plan  1. MDD (major depressive disorder), recurrent episode, moderate (HCC)  Discussed CBT to see if pain will improve  2. Fibromyalgia  Continue PT, may need to modify it

## 2023-03-29 NOTE — Patient Instructions (Incomplete)
Research about CBT and if you insurance pays for it Water therapy , tens unit, dry needling are good options for you   Preventive Care 52-37 Years Old, Female Preventive care refers to lifestyle choices and visits with your health care provider that can promote health and wellness. Preventive care visits are also called wellness exams. What can I expect for my preventive care visit? Counseling During your preventive care visit, your health care provider may ask about your: Medical history, including: Past medical problems. Family medical history. Pregnancy history. Current health, including: Menstrual cycle. Method of birth control. Emotional well-being. Home life and relationship well-being. Sexual activity and sexual health. Lifestyle, including: Alcohol, nicotine or tobacco, and drug use. Access to firearms. Diet, exercise, and sleep habits. Work and work Astronomer. Sunscreen use. Safety issues such as seatbelt and bike helmet use. Physical exam Your health care provider may check your: Height and weight. These may be used to calculate your BMI (body mass index). BMI is a measurement that tells if you are at a healthy weight. Waist circumference. This measures the distance around your waistline. This measurement also tells if you are at a healthy weight and may help predict your risk of certain diseases, such as type 2 diabetes and high blood pressure. Heart rate and blood pressure. Body temperature. Skin for abnormal spots. What immunizations do I need?  Vaccines are usually given at various ages, according to a schedule. Your health care provider will recommend vaccines for you based on your age, medical history, and lifestyle or other factors, such as travel or where you work. What tests do I need? Screening Your health care provider may recommend screening tests for certain conditions. This may include: Pelvic exam and Pap test. Lipid and cholesterol levels. Diabetes  screening. This is done by checking your blood sugar (glucose) after you have not eaten for a while (fasting). Hepatitis B test. Hepatitis C test. HIV (human immunodeficiency virus) test. STI (sexually transmitted infection) testing, if you are at risk. BRCA-related cancer screening. This may be done if you have a family history of breast, ovarian, tubal, or peritoneal cancers. Talk with your health care provider about your test results, treatment options, and if necessary, the need for more tests. Follow these instructions at home: Eating and drinking  Eat a healthy diet that includes fresh fruits and vegetables, whole grains, lean protein, and low-fat dairy products. Take vitamin and mineral supplements as recommended by your health care provider. Do not drink alcohol if: Your health care provider tells you not to drink. You are pregnant, may be pregnant, or are planning to become pregnant. If you drink alcohol: Limit how much you have to 0-1 drink a day. Know how much alcohol is in your drink. In the [place] one drink equals one 12 oz bottle of beer (355 mL), one 5 oz glass of wine (148 mL), or one 1 oz glass of hard liquor (44 mL). Lifestyle Brush your teeth every morning and night with fluoride toothpaste. Floss one time each day. Exercise for at least 30 minutes 5 or more days each week. Do not use any products that contain nicotine or tobacco. These products include cigarettes, chewing tobacco, and vaping devices, such as e-cigarettes. If you need help quitting, ask your health care provider. Do not use drugs. If you are sexually active, practice safe sex. Use a condom or other form of protection to prevent STIs. If you do not wish to become pregnant, use a form of birth control.  If you plan to become pregnant, see your health care provider for a prepregnancy visit. Find healthy ways to manage stress, such as: Meditation, yoga, or listening to music. Journaling. Talking to a trusted  person. Spending time with friends and family. Minimize exposure to UV radiation to reduce your risk of skin cancer. Safety Always wear your seat belt while driving or riding in a vehicle. Do not drive: If you have been drinking alcohol. Do not ride with someone who has been drinking. If you have been using any mind-altering substances or drugs. While texting. When you are tired or distracted. Wear a helmet and other protective equipment during sports activities. If you have firearms in your house, make sure you follow all gun safety procedures. Seek help if you have been physically or sexually abused. What's next? Go to your health care provider once a year for an annual wellness visit. Ask your health care provider how often you should have your eyes and teeth checked. Stay up to date on all vaccines. This information is not intended to replace advice given to you by your health care provider. Make sure you discuss any questions you have with your health care provider. Document Revised: 05/12/2021 Document Reviewed: 05/12/2021 Elsevier Patient Education  [place].

## 2023-03-30 ENCOUNTER — Ambulatory Visit (INDEPENDENT_AMBULATORY_CARE_PROVIDER_SITE_OTHER): Payer: Medicare Other | Admitting: Family Medicine

## 2023-03-30 ENCOUNTER — Encounter: Payer: Self-pay | Admitting: Family Medicine

## 2023-03-30 VITALS — BP 122/76 | HR 97 | Resp 16 | Ht 63.0 in | Wt 183.0 lb

## 2023-03-30 DIAGNOSIS — M797 Fibromyalgia: Secondary | ICD-10-CM

## 2023-03-30 DIAGNOSIS — F331 Major depressive disorder, recurrent, moderate: Secondary | ICD-10-CM

## 2023-03-30 DIAGNOSIS — Z0000 Encounter for general adult medical examination without abnormal findings: Secondary | ICD-10-CM

## 2023-03-30 NOTE — Progress Notes (Unsigned)
    GYNECOLOGY PROGRESS NOTE  Subjective:    Patient ID: KARRON KOVACEVIC, female    DOB: 06-Jul-1986, 37 y.o.   MRN: [id number]  HPI  Patient is a 37 y.o. G0P0000 female who presents for Dysmenorrhea. She had an endometrial ablation, and it did help. She feel like symptoms are coming back and she would like to discuss methods to control them, before they get worse.  {Common ambulatory SmartLinks:19316}  Review of Systems {ros; complete:30496}   Objective:   Blood pressure 105/75, pulse 82, resp. rate 16, height 5\' 3"  (1.6 m), weight 184 lb 4.8 oz (83.6 kg), last menstrual period 03/13/2023. Body mass index is 32.65 kg/m. General appearance: {general exam:16600} Abdomen: {abdominal exam:16834} Pelvic: {pelvic exam:16852::"cervix normal in appearance","external genitalia normal","no adnexal masses or tenderness","no cervical motion tenderness","rectovaginal septum normal","uterus normal size, shape, and consistency","vagina normal without discharge"} Extremities: {extremity exam:5109} Neurologic: {neuro exam:17854}   Assessment:   No diagnosis found.   Plan:   There are no diagnoses linked to this encounter.    Hildred Laser, MD [facility]

## 2023-03-31 ENCOUNTER — Ambulatory Visit (INDEPENDENT_AMBULATORY_CARE_PROVIDER_SITE_OTHER): Payer: Medicare Other | Admitting: Obstetrics and Gynecology

## 2023-03-31 ENCOUNTER — Ambulatory Visit: Payer: Medicare Other | Admitting: Obstetrics and Gynecology

## 2023-03-31 VITALS — BP 105/75 | HR 82 | Resp 16 | Ht 63.0 in | Wt 184.3 lb

## 2023-03-31 DIAGNOSIS — Z9889 Other specified postprocedural states: Secondary | ICD-10-CM

## 2023-03-31 DIAGNOSIS — N946 Dysmenorrhea, unspecified: Secondary | ICD-10-CM | POA: Diagnosis not present

## 2023-03-31 DIAGNOSIS — N808 Other endometriosis: Secondary | ICD-10-CM

## 2023-03-31 MED ORDER — ORILISSA 200 MG PO TABS: 1.0000 | ORAL_TABLET | Freq: Two times a day (BID) | ORAL | 5 refills | Status: DC

## 2023-04-01 ENCOUNTER — Encounter: Payer: Self-pay | Admitting: Obstetrics and Gynecology

## 2023-04-04 ENCOUNTER — Encounter: Payer: Medicare Other | Admitting: Rehabilitative and Restorative Service Providers"

## 2023-04-06 NOTE — Progress Notes (Signed)
Acute Office Visit  Subjective:     Patient ID: Yesenia Wood, female    DOB: [birth year], 37 y.o.   MRN: [id number]  Chief Complaint  Patient presents with   Ankle Pain    Bilateral due to injuries    HPI Patient is in today for heel/ankle pain.  An interpreter is present to help aid in communication.  Patient states that she has chronic left ankle pain on the lateral and posterior sides for the last 7 years.  Last week she was walking and slipped on the curb right sided ankle pain.  Pain is located on the lateral ankle and posterior ankle radiating to the Achilles tendon.  She did note a mild twisting motion/inversion when she slipped.  She is able to bear weight but states she is limping and has on her tiptoes.  She has tried ibuprofen and Tylenol both of which have not improved the pain.  She denies swelling, redness, bruising or other skin changes.    Review of Systems  Constitutional:  Negative for chills and fever.  Musculoskeletal:  Positive for joint pain.  Skin: Negative.         Objective:    BP 118/72   Pulse 82   Temp 98.4 F (36.9 C)   Resp 16   Ht 5\' 3"  (1.6 m)   Wt 186 lb 14.4 oz (84.8 kg)   LMP 03/13/2023 (Exact Date)   SpO2 98%   BMI 33.11 kg/m    Physical Exam Constitutional:      Appearance: Normal appearance.  HENT:     Head: Normocephalic and atraumatic.  Eyes:     Conjunctiva/sclera: Conjunctivae normal.  Cardiovascular:     Rate and Rhythm: Normal rate and regular rhythm.  Pulmonary:     Effort: Pulmonary effort is normal.     Breath sounds: Normal breath sounds.  Musculoskeletal:        General: Tenderness present. No swelling.     Right lower leg: No edema.     Left lower leg: No edema.     Right ankle: No swelling or ecchymosis. Tenderness present over the ATF ligament and CF ligament. No lateral malleolus, medial malleolus, base of 5th metatarsal or proximal fibula tenderness. Normal range of motion.     Right Achilles Tendon:  Tenderness present.     Left ankle: No swelling or ecchymosis. Tenderness present over the ATF ligament and CF ligament. No lateral malleolus, medial malleolus, base of 5th metatarsal or proximal fibula tenderness. Normal range of motion.     Left Achilles Tendon: No tenderness.     Comments: Pain to palpation on the right and left over the ATF and CFL.  Skin:    General: Skin is warm and dry.  Neurological:     General: No focal deficit present.     Mental Status: She is alert. Mental status is at baseline.  Psychiatric:        Mood and Affect: Mood normal.        Behavior: Behavior normal.     No results found for any visits on 04/07/23.      Assessment & Plan:   1. Acute right ankle pain/Chronic pain of left ankle: Discussed with the patient that her acute right ankle pain is most likely due to a sprain of the ligaments of the ankle.  Patient is frustrated because she has been resting, elevating and icing her ankle as well as taking ibuprofen 800 mg daily without  relief.  We will obtain an x-ray of both ankles today due to her chronic ankle pain on the left and her inability to walk patiently or without a limp.  She does have a diagnosis of fibromyalgia.  Will prescribe naproxen 500 mg to take twice daily with food.  Patient mild rash reaction to naproxen, she will take Benadryl as well.  - naproxen (NAPROSYN) 500 MG tablet; Take 1 tablet (500 mg total) by mouth 2 (two) times daily with a meal for 10 days.  Dispense: 20 tablet; Refill: 0 - DG Ankle Complete Right; Future - DG Ankle Complete Left; Future  Return if symptoms worsen or fail to improve.  Margarita Mail, DO

## 2023-04-07 ENCOUNTER — Ambulatory Visit (INDEPENDENT_AMBULATORY_CARE_PROVIDER_SITE_OTHER): Payer: Medicare Other | Admitting: Internal Medicine

## 2023-04-07 ENCOUNTER — Encounter: Payer: Self-pay | Admitting: Internal Medicine

## 2023-04-07 ENCOUNTER — Ambulatory Visit
Admission: RE | Admit: 2023-04-07 | Discharge: 2023-04-07 | Disposition: A | Discharge status: Home / Self Care | Payer: Medicare Other | Source: Ambulatory Visit | Attending: Internal Medicine | Admitting: Internal Medicine

## 2023-04-07 ENCOUNTER — Ambulatory Visit
Admission: RE | Admit: 2023-04-07 | Discharge: 2023-04-07 | Disposition: A | Discharge status: Home / Self Care | Payer: Medicare Other | Attending: Internal Medicine | Admitting: Internal Medicine

## 2023-04-07 VITALS — BP 118/72 | HR 82 | Temp 98.4°F | Resp 16 | Ht 63.0 in | Wt 186.9 lb

## 2023-04-07 DIAGNOSIS — M25571 Pain in right ankle and joints of right foot: Secondary | ICD-10-CM | POA: Diagnosis not present

## 2023-04-07 DIAGNOSIS — G8929 Other chronic pain: Secondary | ICD-10-CM

## 2023-04-07 DIAGNOSIS — M25572 Pain in left ankle and joints of left foot: Secondary | ICD-10-CM | POA: Diagnosis not present

## 2023-04-07 MED ORDER — NAPROXEN 500 MG PO TABS
500.0000 mg | ORAL_TABLET | Freq: Two times a day (BID) | ORAL | 0 refills | Status: AC
Start: 2023-04-07 — End: 2023-04-17

## 2023-04-12 ENCOUNTER — Encounter: Payer: Self-pay | Admitting: Physical Medicine and Rehabilitation

## 2023-04-13 ENCOUNTER — Encounter: Payer: Self-pay | Admitting: Rehabilitative and Restorative Service Providers"

## 2023-04-13 ENCOUNTER — Ambulatory Visit
Payer: Medicare Other | Attending: Physical Medicine and Rehabilitation | Admitting: Rehabilitative and Restorative Service Providers"

## 2023-04-13 DIAGNOSIS — M6281 Muscle weakness (generalized): Secondary | ICD-10-CM

## 2023-04-13 DIAGNOSIS — R252 Cramp and spasm: Secondary | ICD-10-CM | POA: Diagnosis not present

## 2023-04-13 DIAGNOSIS — R2689 Other abnormalities of gait and mobility: Secondary | ICD-10-CM | POA: Insufficient documentation

## 2023-04-13 DIAGNOSIS — R42 Dizziness and giddiness: Secondary | ICD-10-CM

## 2023-04-13 DIAGNOSIS — R52 Pain, unspecified: Secondary | ICD-10-CM | POA: Diagnosis not present

## 2023-04-13 NOTE — Therapy (Signed)
OUTPATIENT PHYSICAL THERAPY TREATMENT NOTE   Patient Name: CLEMENCIA VEAL MRN: [id number] DOB:[birth year], 37 y.o., female Today's Date: 04/13/2023  END OF SESSION:  PT End of Session - 04/13/23 1107     Visit Number 4    Date for PT Re-Evaluation 05/05/23    Authorization Type Medicare A    Progress Note Due on Visit 10    PT Start Time 1100    PT Stop Time 1125    PT Time Calculation (min) 25 min    Activity Tolerance Patient tolerated treatment well;Patient limited by pain    Behavior During Therapy [facility] for tasks assessed/performed              Past Medical History:  Diagnosis Date   ADHD (attention deficit hyperactivity disorder)    B12 deficiency    Bell's palsy    right sided   Chest pain    a. 05/2015 ETT: elev HR/BP, but no acute ST/T changes; b. 09/2019 Cor CTA: Nl cors, Ca2+ = 0.   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   Fatigue    Fatty liver    Fibromyalgia    GERD (gastroesophageal reflux disease)    Headache    High triglycerides    Hyperglycemia    Hypertension    Hyperthyroidism    Moderate asthma    Obesity    Paresthesia    Premature birth    Past Surgical History:  Procedure Laterality Date   BARTHOLIN GLAND CYST REMOVAL     COCHLEAR IMPLANT     COCHLEAR IMPLANT REMOVAL     DILATATION AND CURETTAGE/HYSTEROSCOPY WITH MINERVA N/A 03/05/2021   Procedure: DILATATION AND CURETTAGE/HYSTEROSCOPY WITH MINERVA;  Surgeon: Hildred Laser, MD;  Location: [facility] ORS;  Service: Gynecology;  Laterality: N/A;   LAPAROSCOPIC TUBAL LIGATION Bilateral 03/26/2018   Procedure: LAPAROSCOPIC TUBAL LIGATION;  Surgeon: Hildred Laser, MD;  Location: [facility] OR[facility];  Service: Gynecology;  Laterality: Bilateral;  with peritoneal biopsies   LAPAROSCOPY     MYRINGOTOMY WITH TUBE PLACEMENT     TUBAL LIGATION  03/26/2018   Patient Active Problem List   Diagnosis Date Noted   Chronic  pain syndrome 03/03/2023   Nausea 08/12/2022   Dyslipidemia 02/10/2022   Vitamin D deficiency 02/10/2022   Positive ANA (antinuclear antibody) 12/20/2021   Fibromyalgia 12/20/2021   Sleep difficulties 04/30/2020   Right sided numbness 12/26/2019   RLS (restless legs syndrome) 09/30/2019   MDD (major depressive disorder), recurrent episode, moderate (HCC) 07/17/2019   Social anxiety disorder 07/17/2019   Muteness 09/10/2018   OSA (obstructive sleep apnea) 10/10/2016   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   Dissocial personality disorder (HCC) 06/07/2016   Sleep disturbance 05/10/2016   Chronic migraine without aura 03/29/2016   Palpitations 06/09/2015   Deaf 05/16/2015   B12 deficiency 05/16/2015   Cochlear implant status 05/16/2015   Fatty infiltration of liver 05/16/2015   Gastro-esophageal reflux disease without esophagitis 05/16/2015   Generalized headache 05/16/2015   Right-sided Bell's palsy 05/16/2015   Hypertriglyceridemia 05/16/2015   Irregular menstrual cycle 05/16/2015   Overweight 05/16/2015   Paresthesia 05/16/2015   Disorder of labyrinth 05/16/2015   Migraine 08/05/2013   Allergic rhinitis 07/01/2013   Obesity (BMI 30-39.9) 07/01/2013   Bartholin gland cyst 09/16/2008    PCP: Alba Cory, MD  REFERRING PROVIDER: Angelina Sheriff, DO  REFERRING DIAG: [phone] (  ICD-10-CM) - Chronic pain syndrome M79.7 (ICD-10-CM) - Fibromyalgia  THERAPY DIAG:  Other abnormalities of gait and mobility  Muscle weakness (generalized)  Cramp and spasm  Dizziness and giddiness  Generalized pain  Rationale for Evaluation and Treatment: Rehabilitation  ONSET DATE: Has been around since Middle School, but has gotten worse over the past years  SUBJECTIVE:   SUBJECTIVE STATEMENT: Pt reported that she is still having pain. Wanting to try dry needling today.  Sign Language Interpreter present throughout  PERTINENT HISTORY: Fibromyalgia, restless leg syndrome,  deaf with cochlear implants (patient does not utilize), chronic migraines, chronic pain syndrome, Hx of right sided Bell's Palsy PAIN:  Are you having pain? Yes: NPRS scale: 4-5/10 Pain location: cervical, hips, back Pain description: crampy, sometimes sharp Aggravating factors: unknown Relieving factors: unknown  PRECAUTIONS: None  WEIGHT BEARING RESTRICTIONS: No  FALLS:  Has patient fallen in last 6 months? Yes. Number of falls 2-3x/week, states that she typically loses balance on left side  LIVING ENVIRONMENT: Lives with: lives with their family Lives in: House/apartment Stairs:  2 level home, her bedroom on the main level. Has following equipment at home: Ramped entry  PLOF: Independent and Leisure: learning new things, crafting, knitting, crochet, reading books  PATIENT GOALS: To be able to walk more than 15 minutes without back pain.  NEXT MD VISIT: PCP on Mar 30, 2023  OBJECTIVE:   DIAGNOSTIC FINDINGS: n/a  PATIENT SURVEYS:  03/10/2023:  LEFS 32 / 80 = 40.0 %  COGNITION: Overall cognitive status: Within functional limits for tasks assessed     SENSATION: Reports numbness and tingling on right side of the body, numbness and tingling on left hand   MUSCLE LENGTH: Hamstrings: tightness noted  POSTURE: rounded shoulders, forward head, and anterior pelvic tilt  PALPATION: Tenderness to palpation  LOWER EXTREMITY ROM:  03/10/2023:  WFL  LOWER EXTREMITY MMT:  03/10/2023:  Right hip strength 4/5, Left hip 4 to 4+/5 Right UE strength of 4/5 Left UE strength WFL  LOWER EXTREMITY SPECIAL TESTS:  4/12/024:  slump test positive (right > left)  FUNCTIONAL TESTS:  Eval: 5 times sit to stand: 19.74 seconds with hands pushing up from thighs and reports of increased back pain Single Leg Stance:  greater than 30 seconds on each side  03/15/2023: 6 minute walk test:  338 ft with patient stopping test early secondary to increased pain.  GAIT: Distance walked: in  clinic Assistive device utilized: None Level of assistance: Complete Independence Comments: Pt reports that in general, she can walk between 5-10 minutes before she starts having increased pain.   TODAY'S TREATMENT:                                                                                                                               DATE: 04/13/2023 Trigger Point Dry-Needling  Treatment instructions: Expect mild to moderate muscle soreness. S/S of pneumothorax if dry needled over a lung field, and to  seek immediate medical attention should they occur. Patient verbalized understanding of these instructions and education. Patient Consent Given: Yes Education handout provided: Previously provided Muscles treated: bilateral lumbar multifidi Electrical stimulation performed: No Parameters: N/A Treatment response/outcome: Utilized skilled palpation to locate bony landmarks and trigger points.  Able to illicit twitch response and muscle elongation following.  Manual Therapy:  soft tissue mobilization to bilateral lumbar paraspinals to further encourage muscle elongation and decreased spasms.   DATE: 03/21/23 Warm-up walking: slow pace x3 min around clinic  Supine hamstring stretch with strap 2x20 sec each Supine figure 4 position x20 sec each Attempted seated butterfly stretch, but pt didn't feel this in the correct place Attempted standing adductor stretch x20 sec each side Supine bridge x8 reps (+) increase in low back pain Supine abdominal bracing with tactile/verbal cuing to increase understanding of proper form Supine abdominal bracing with bent knee march x10 reps Discussed ways to decrease back pain with cat/cow stretch at home; use of tennis ball for soft tissue mobilization  03/10/2023 Nustep level 3 x3 minutes with PT present to discuss status Seated hamstring stretch with unilateral LE outstretched onto PT mat x20 seconds bilat Supine hamstring stretch with strap x20 sec  bilat Supine posterior pelvic tilt x10 with cuing for decreased range of motion secondary to pain Supine TA contraction with hands pushing through thighs x10 Lower trunk rotation x10 bilat (with cuing to stay within decreased pain range) Supine bent knee fallouts/clamshell x10 Gilberto Better to bilateral sides.  Patient with reports of increased dizziness to left side, so proceeded with treatment with Epley Manuever    PATIENT EDUCATION:  Education details: Issued HEP Person educated: Patient Education method: Explanation, Demonstration, and Handouts Education comprehension: verbalized understanding  HOME EXERCISE PROGRAM: Access Code: [email or web address] URL: [email or web address] Date: 03/21/2023 Prepared by: [facility]  Exercises - Supine Hamstring Stretch with Strap  - 1 x daily - 7 x weekly - 1 sets - 2 reps - 20 sec hold - Supine Lower Trunk Rotation  - 1 x daily - 7 x weekly - 1 sets - 10 reps - Walking Tandem Stance  - 1 x daily - 7 x weekly - 3 sets - 10 reps - Hooklying Sequential Leg March and Lower  - 1 x daily - 7 x weekly - 3 sets - 10 reps - Cat Cow  - 1 x daily - 7 x weekly - 3 sets - 10 reps  ASSESSMENT:  CLINICAL IMPRESSION: Shamya presents to skilled PT reporting that the exercises has been difficult for her and she has missed some appointments secondary to having a lot of things happening in her personal life.  Patient agreeable to dry needling/manual therapy today to attempt to decrease spasms and overall pain to allow for patient to be able to more easily complete exercises.  Patient recently went to [facility] secondary to a sprained right ankle and she states that she is still having some pain with that.  Patient educated about aquatic PT and the potential benefits of exercising in a gravity minimized environment.  She states at this time, she wants to try dry needling and use the pool as a last resort if the needling does  not help.  Patient with good twitch response noted to dry needling and manual therapy.  Able to palpate increased muscle looseness following manual therapy and patient reports overall feeling less tightness.  Patient continues to require skilled PT to progress towards goal related  activities and overall decreased pain.  OBJECTIVE IMPAIRMENTS: decreased balance, difficulty walking, decreased strength, dizziness, postural dysfunction, and pain.   ACTIVITY LIMITATIONS: sitting, squatting, stairs, and locomotion level  PARTICIPATION LIMITATIONS: community activity  PERSONAL FACTORS: Past/current experiences, Time since onset of injury/illness/exacerbation, and 3+ comorbidities: chronic pain syndrome, fibromyalgia, chronic migraines  are also affecting patient's functional outcome.   REHAB POTENTIAL: Good  CLINICAL DECISION MAKING: Evolving/moderate complexity  EVALUATION COMPLEXITY: Moderate   GOALS: Goals reviewed with patient? Yes  SHORT TERM GOALS: Target date: 03/31/2023  Patient will be independent with initial HEP. Baseline: Goal status: MET  2.  Patient will participate in a 6 min walk test to obtain a baseline functional measurement. Baseline:  Goal status: MET on 03/15/2023   LONG TERM GOALS: Target date: 05/05/2023  Patient will be independent with advanced HEP. Baseline:  Goal status: INITIAL  2.  Patient will report ability to be able to walk for at least 20 minutes without reports of increased pain to allow her to go out in the community. Baseline:  Goal status: INITIAL  3.  Patient will report at least a 50% decrease in the frequency of falls and dizziness to decrease risk of injury. Baseline:  Goal status: INITIAL  4.  Patient will increase UE/LE strength to at least 4+/5 grossly throughout to improve functional strength and ability to complete tasks. Baseline:  Goal status: INITIAL  5.  Patient will increase Lower Extremity Functional Scale to at least 55% to  demonstrate improvements in functional mobility. Baseline: 40% Goal status: INITIAL  6.  Patient will improve time on 5 times sit to/from stand to less than 15 seconds to demonstrate improved power production. Baseline: 19.74 sec with hands pressing up from thighs Goal status: INITIAL   PLAN:  PT FREQUENCY: 1-2x/week  PT DURATION: 8 weeks  PLANNED INTERVENTIONS: Therapeutic exercises, Therapeutic activity, Neuromuscular re-education, Balance training, Gait training, Patient/Family education, Self Care, Joint mobilization, Joint manipulation, Stair training, Vestibular training, Canalith repositioning, Aquatic Therapy, Dry Needling, Electrical stimulation, Spinal manipulation, Spinal mobilization, Cryotherapy, Moist heat, Taping, Traction, Ultrasound, Ionotophoresis 4mg /ml Dexamethasone, Manual therapy, and Re-evaluation  PLAN FOR NEXT SESSION: assess response to dry needling/manual therapy, progress abdominal strength, core stability, vestibular assessment if indicated   Reather Laurence, PT 04/13/23 11:38 AM  [facility] [address], [address] [place], [place] [address] Phone # [phone] Fax [phone]

## 2023-04-14 ENCOUNTER — Ambulatory Visit (INDEPENDENT_AMBULATORY_CARE_PROVIDER_SITE_OTHER): Payer: Medicare Other | Admitting: Licensed Clinical Social Worker

## 2023-04-14 DIAGNOSIS — F331 Major depressive disorder, recurrent, moderate: Secondary | ICD-10-CM

## 2023-04-14 DIAGNOSIS — F32A Depression, unspecified: Secondary | ICD-10-CM

## 2023-04-14 DIAGNOSIS — F4010 Social phobia, unspecified: Secondary | ICD-10-CM | POA: Diagnosis not present

## 2023-04-14 NOTE — Progress Notes (Addendum)
Virtual Visit via Audio Note  I connected with [name] on 04/14/23 at 11:00 AM EDT by an audio enabled telemedicine application and verified that I am speaking with the correct person using two identifiers.Sign language interpreter was on audio line for assistance.   Location: Patient: home Provider: remote office [place], [place])   I discussed the limitations of evaluation and management by telemedicine and the availability of in person appointments. The patient expressed understanding and agreed to proceed.   I discussed the assessment and treatment plan with the patient. The patient was provided an opportunity to ask questions and all were answered. The patient agreed with the plan and demonstrated an understanding of the instructions.   The patient was advised to call back or seek an in-person evaluation if the symptoms worsen or if the condition fails to improve as anticipated.  I provided 30 minutes of non-face-to-face time during this encounter.   Franny Selvage R Jazir Newey, LCSW   THERAPIST PROGRESS NOTE  Session Time: 10-1030a              Participation Level: Active  Behavioral Response: NAAlertAnxious  Type of Therapy: Individual Therapy  Treatment Goals addressed:  Develop healthy thinking patterns and beliefs about self, others, and the world that lead to the alleviation and help prevent the relapse of depression per self report 3 out of 5 sessions documented    Learn and implement coping skills that result in a reduction of anxiety and worry, and improve daily functioning per pt report 3 out of 5 sessions documented   Interventions: CBT; supportive; narrative   Summary: GINNIE SCHMUDE is a 37 y.o. female who presents with continuing symptoms related to depression and social anxiety diagnoses.  Patient reports that she is feeling very stressed and overwhelmed after having several incidents occur recently.  Patient reports that her grandmother has had some coughing  issues, and has had to go to the emergency room at least 2 times.  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  Patient reports that she had an accident recently, and sprained her ankle.  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  Patient states that she is managing things, and is doing okay.  Patient reports she had a scare when her father had some severe headaches triggered by a pinched nerve.  Patient states that her father is doing better, but it was a very scary situation for both patient and her mother at the time.  Patient reports that she is feeling very anxious and very overwhelmed.  Reviewed the importance of taking breaks throughout the day, to allow herself time to relax and unwind.  Allow patient to identify behaviors, activities, and situations that trigger relaxation for her.  Encouraged patient to engage in these activities at least several times per day to assist in managing overall stress.  Patient made the statement that she has a lot of upcoming doctor's appointments, which patient finds very intimidating and stressful.  Patient was asking how to manage those.  Encouraged patient to keep all of her doctor's appointments, and to discuss with physical therapy/physical rehabilitation clinician if there are some at home exercises that she can engage in so she would not have as many appointments.  Patient reflects understanding and states that she will have  a conversation with providers at her next appointment.  Continued recommendations are as follows: self care behaviors, positive social engagements, focusing on overall work/home/life balance, and focusing on positive physical and emotional wellness.   Suicidal/Homicidal: No  Therapist Response: Pt is continuing to apply interventions learned in session into  daily life situations. Personal growth and progress is continuing at time of session.Treatment to continue as indicated.   Plan: Return again in 4 weeks.  Diagnosis:  No diagnosis found.   Collaboration of Care: Other pt requested referral back to psychiatrist of record, Dr. Jomarie Longs  Patient/Guardian was advised Release of Information must be obtained prior to any record release in order to collaborate their care with an outside provider. Patient/Guardian was advised if they have not already done so to contact the registration department to sign all necessary forms in order for [place] to release information regarding their care.   Consent: Patient/Guardian gives verbal consent for treatment and assignment of benefits for services provided during this visit. Patient/Guardian expressed understanding and agreed to proceed.       Ernest Haber Crystale Giannattasio, LCSW 04/14/2023

## 2023-04-19 ENCOUNTER — Encounter: Payer: Self-pay | Admitting: Obstetrics and Gynecology

## 2023-04-19 NOTE — Telephone Encounter (Signed)
Submitted a verbal appeal, should know something within 72 hours after a review.

## 2023-04-20 ENCOUNTER — Ambulatory Visit: Payer: Medicare Other | Admitting: Rehabilitative and Restorative Service Providers"

## 2023-04-21 ENCOUNTER — Encounter: Payer: Self-pay | Admitting: Physical Medicine & Rehabilitation

## 2023-04-21 ENCOUNTER — Encounter: Payer: Medicare Other | Attending: Physical Medicine and Rehabilitation | Admitting: Physical Medicine & Rehabilitation

## 2023-04-21 VITALS — BP 95/89 | HR 81 | Ht 63.0 in | Wt 181.8 lb

## 2023-04-21 DIAGNOSIS — M797 Fibromyalgia: Secondary | ICD-10-CM | POA: Insufficient documentation

## 2023-04-21 NOTE — Patient Instructions (Signed)
Acupuncture Acupuncture is a type of treatment that involves stimulating specific points on your body by inserting thin needles through your skin. Acupuncture is often used to treat pain, but it may also be used to help relieve other types of symptoms. Your health care provider may recommend acupuncture to help treat various conditions, such as: Migraine and tension headaches. Nausea and vomiting after a surgery or cancer treatment. Sudden or severe (acute) pain, or long-term (chronic) pain. Addiction. Acupuncture is based on traditional Chinese medicine, which recognizes more than 2,000 points on the body that connect energy pathways (meridians) through the body. The goal in stimulating these points is to balance the physical, emotional, and mental energy in your body. Acupuncture is done by a health care provider who has specialized training (licensed acupuncture practitioner). Treatment often requires several acupuncture sessions. You may have acupuncture along with other medical treatments. Tell a health care provider about: Any allergies you have. All medicines you are taking, including vitamins, herbs, eye drops, creams, and over-the-counter medicines. Any blood disorders you have. Any surgeries you have had. Any medical conditions you have. Whether you are pregnant or may be pregnant. What are the risks? Generally, this is a safe treatment. However, problems may occur, including: Skin infection. Damage to organs or structures that are under the skin if a needle is placed too deeply. This is rare. What happens before the treatment? Your acupuncture practitioner will ask about your medical history and your symptoms. You may have a physical exam. What happens during the treatment? The exact procedure will depend on your condition and how your acupuncture provider treats it. In general: Your skin will be cleaned with a germ-killing (antiseptic) solution. Your acupuncture practitioner will  open a new set of germ-free (sterile) needles. The needles will be gently inserted into your skin. They will be left in place for a certain amount of time. You may feel a tingling or burning sensation for a very short period of time. Your acupuncture practitioner may: Apply electrical energy to the needles. Adjust the needles in certain ways. After your procedure, the acupuncture practitioner will remove the needles, throw them away, and clean your skin. The procedure may vary among health care providers. What can I expect after the treatment? People react differently to acupuncture. Make sure you ask your acupuncture provider what to expect after your treatment. It is common to have: Minor bruising. Mild pain. A small amount of bleeding. Follow these instructions at home: Follow any instructions given by your provider after the treatment. Keep all follow-up visits. This is important. Where to find more information National [employer]ative [employer]: www.nccih.nih.gov Contact a health care provider if: You have questions about your reaction to the treatment. You have soreness. You have skin irritation or redness. You have a fever. Summary Acupuncture is a type of treatment that involves stimulating specific points on your body by inserting thin needles through your skin. This treatment is often used to treat pain, but it may also be used to help relieve other types of symptoms. The exact procedure will depend on your condition and how your acupuncture provider treats it. This information is not intended to replace advice given to you by your health care provider. Make sure you discuss any questions you have with your health care provider. Document Revised: 07/21/2021 Document Reviewed: 07/21/2021 Elsevier Patient Education  2024 [employer]  

## 2023-04-21 NOTE — Progress Notes (Signed)
Subjective:    Patient ID: Yesenia Wood, female    DOB: July 20, 1986, 37 y.o.   MRN: [id number] Patient is deaf has a sign language interpreter in room HPI 37 year old female with chronic migraines as well as fibromyalgia syndrome referred to evaluate for acupuncture to assist with pain management.  The patient states she has had pain in her back neck hips and feet for many years.  She rates her pain is moderate.  Pain is worse daytime and night but not as bad in the morning.  Pain is worse with walking bending and standing and improves with rest.  Has tried PT including dry needling , with no improvement after 1 treatment  Has tried medications but these have not been helpful and she does not wish to take so many medications.  She has multiple medication allergies and intolerances as well. Has not tried acupuncture  Pain Inventory Average Pain 5 Pain Right Now 5 My pain is sharp, burning, tingling, and aching  In the last 24 hours, has pain interfered with the following? General activity 3 Relation with others 3 Enjoyment of life 7 What TIME of day is your pain at its worst? daytime and night Sleep (in general) Fair  Pain is worse with: walking, bending, and standing Pain improves with: rest Relief from Meds: 2  Family History  Adopted: Yes  Family history unknown: Yes   Social History   Socioeconomic History   Marital status: Single    Spouse name: Not on file   Number of children: 0   Years of education: Not on file   Highest education level: Some college, no degree  Occupational History   Occupation: disability for hearing loss  Tobacco Use   Smoking status: Former    Packs/day: 1.00    Years: 2.00    Additional pack years: 0.00    Total pack years: 2.00    Types: Cigarettes    Start date: 11/28/2004    Quit date: 11/28/2006    Years since quitting: 16.4   Smokeless tobacco: Never  Vaping Use   Vaping Use: Never used  Substance and Sexual Activity   Alcohol use:  Yes    Comment: 1 every 3 months   Drug use: No   Sexual activity: Not Currently    Birth control/protection: Pill    Comment: Seasonale  Other Topics Concern   Not on file  Social History Narrative   Lives with her adopted  parents   Hearing loss since birth    Social Determinants of Health   Financial Resource Strain: Low Risk  (03/30/2023)   Overall Financial Resource Strain (CARDIA)    Difficulty of Paying Living Expenses: Not hard at all  Food Insecurity: No Food Insecurity (03/30/2023)   Hunger Vital Sign    Worried About Running Out of Food in the Last Year: Never true    Ran Out of Food in the Last Year: Never true  Transportation Needs: No Transportation Needs (03/30/2023)   PRAPARE - Administrator, Civil Service (Medical): No    Lack of Transportation (Non-Medical): No  Physical Activity: Sufficiently Active (03/30/2023)   Exercise Vital Sign    Days of Exercise per Week: 7 days    Minutes of Exercise per Session: 30 min  Stress: No Stress Concern Present (03/30/2023)   [employer] of Occupational Health - Occupational Stress Questionnaire    Feeling of Stress : Only a little  Social Connections: Socially Isolated (03/30/2023)  Social Advertising account executive [NHANES]    Frequency of Communication with Friends and Family: Once a week    Frequency of Social Gatherings with Friends and Family: Once a week    Attends Religious Services: Never    Database administrator or Organizations: Yes    Attends Engineer, structural: Never    Marital Status: Never married   Past Surgical History:  Procedure Laterality Date   BARTHOLIN GLAND CYST REMOVAL     COCHLEAR IMPLANT     COCHLEAR IMPLANT REMOVAL     DILATATION AND CURETTAGE/HYSTEROSCOPY WITH MINERVA N/A 03/05/2021   Procedure: DILATATION AND CURETTAGE/HYSTEROSCOPY WITH MINERVA;  Surgeon: Hildred Laser, MD;  Location: [facility];  Service: Gynecology;  Laterality: N/A;   LAPAROSCOPIC TUBAL LIGATION  Bilateral 03/26/2018   Procedure: LAPAROSCOPIC TUBAL LIGATION;  Surgeon: Hildred Laser, MD;  Location: [facility];  Service: Gynecology;  Laterality: Bilateral;  with peritoneal biopsies   LAPAROSCOPY     MYRINGOTOMY WITH TUBE PLACEMENT     TUBAL LIGATION  03/26/2018   Past Surgical History:  Procedure Laterality Date   BARTHOLIN GLAND CYST REMOVAL     COCHLEAR IMPLANT     COCHLEAR IMPLANT REMOVAL     DILATATION AND CURETTAGE/HYSTEROSCOPY WITH MINERVA N/A 03/05/2021   Procedure: DILATATION AND CURETTAGE/HYSTEROSCOPY WITH MINERVA;  Surgeon: Hildred Laser, MD;  Location: [facility];  Service: Gynecology;  Laterality: N/A;   LAPAROSCOPIC TUBAL LIGATION Bilateral 03/26/2018   Procedure: LAPAROSCOPIC TUBAL LIGATION;  Surgeon: Hildred Laser, MD;  Location: [facility];  Service: Gynecology;  Laterality: Bilateral;  with peritoneal biopsies   LAPAROSCOPY     MYRINGOTOMY WITH TUBE PLACEMENT     TUBAL LIGATION  03/26/2018   Past Medical History:  Diagnosis Date   ADHD (attention deficit hyperactivity disorder)    B12 deficiency    Bell's palsy    right sided   Chest pain    a. 05/2015 ETT: elev HR/BP, but no acute ST/T changes; b. 09/2019 Cor CTA: Nl cors, Ca2+ = 0.   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   Fatigue    Fatty liver    Fibromyalgia    GERD (gastroesophageal reflux disease)    Headache    High triglycerides    Hyperglycemia    Hypertension    Hyperthyroidism    Moderate asthma    Obesity    Paresthesia    Premature birth    BP 95/89   Pulse 81   Ht 5\' 3"  (1.6 m)   Wt 181 lb 12.8 oz (82.5 kg)   LMP 03/13/2023 (Exact Date)   SpO2 95%   BMI 32.20 kg/m   Opioid Risk Score:   Fall Risk Score:  `1  Depression screen [facility] 2/9     04/07/2023   10:20 AM 03/30/2023   10:01 AM 03/03/2023   10:13 AM 02/27/2023   10:30 AM 02/10/2023   10:14 AM 10/24/2022   11:15 AM 08/19/2022   11:22 AM   Depression screen PHQ 2/9  Decreased Interest 2 2  1 1  1   Down, Depressed, Hopeless 1 3  1 1  1   PHQ - 2 Score 3 5  2 2  2   Altered sleeping 3 3  2 3  3   Tired, decreased energy 2 2  3 3  3   Change in appetite 1 1  0 1  0  Feeling bad or failure about yourself  2 0  1 1  0  Trouble concentrating 1 1  1 1  1   Moving slowly or fidgety/restless 0 0  1 1  0  Suicidal thoughts 0 0  0 0  0  PHQ-9 Score 12 12  10 12  9   Difficult doing work/chores Somewhat difficult    Somewhat difficult       Information is confidential and restricted. Go to Review Flowsheets to unlock data.     Review of Systems  Musculoskeletal:  Positive for back pain. Negative for gait problem.       Bilateral shoulder pain Bilateral hip pain Bilateral ankle pain  All other systems reviewed and are negative.     Objective:   Physical Exam General no acute distress Mood and affect appropriate  Fwd bendig and side bending painful  Patient has minimal tenderness in the upper traps and thoracic paraspinals she has moderate tenderness in the upper lumbar paraspinals and moderate to severe tenderness in the lower lumbar paraspinals as well as greater trochanter area.  She has mild tenderness over the lateral epicondyles and VMO's bilaterally. She ambulates without assistive device no evidence of toe drag or knee instability Range of motion is normal bilateral upper and lower limbs There is no evidence of limb deformity.      Assessment & Plan:   #1.  Chronic widespread pain consistent with fibromyalgia syndrome.  She does have more tenderness in the lumbar paraspinal area.  We discussed acupuncture treatment including more focused treatments on the most painful area versus more generalized treatments.  We discussed that it would require weekly visits for 4 visits to assess efficacy.  We discussed electroacupuncture and the differences between dry needling and this treatment. Patient would like to proceed with  treatment.

## 2023-04-26 ENCOUNTER — Ambulatory Visit: Payer: Medicare Other | Admitting: Rehabilitative and Restorative Service Providers"

## 2023-04-26 ENCOUNTER — Encounter: Payer: Self-pay | Admitting: Rehabilitative and Restorative Service Providers"

## 2023-04-26 DIAGNOSIS — R2689 Other abnormalities of gait and mobility: Secondary | ICD-10-CM

## 2023-04-26 DIAGNOSIS — R42 Dizziness and giddiness: Secondary | ICD-10-CM

## 2023-04-26 DIAGNOSIS — R52 Pain, unspecified: Secondary | ICD-10-CM | POA: Diagnosis not present

## 2023-04-26 DIAGNOSIS — M6281 Muscle weakness (generalized): Secondary | ICD-10-CM

## 2023-04-26 DIAGNOSIS — R252 Cramp and spasm: Secondary | ICD-10-CM | POA: Diagnosis not present

## 2023-04-26 NOTE — Therapy (Signed)
OUTPATIENT PHYSICAL THERAPY TREATMENT NOTE   Patient Name: Yesenia Wood MRN: [id number] DOB:13-Aug-1986, 37 y.o., female Today's Date: 04/26/2023  END OF SESSION:  PT End of Session - 04/26/23 1103     Visit Number 5    Date for PT Re-Evaluation 05/05/23    Authorization Type Medicare A    Authorization - Visit Number 4    Progress Note Due on Visit 10    PT Start Time 1100    PT Stop Time 1130    PT Time Calculation (min) 30 min    Activity Tolerance Patient tolerated treatment well    Behavior During Therapy WFL for tasks assessed/performed              Past Medical History:  Diagnosis Date   ADHD (attention deficit hyperactivity disorder)    B12 deficiency    Bell's palsy    right sided   Chest pain    a. 05/2015 ETT: elev HR/BP, but no acute ST/T changes; b. 09/2019 Cor CTA: Nl cors, Ca2+ = 0.   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   Fatigue    Fatty liver    Fibromyalgia    GERD (gastroesophageal reflux disease)    Headache    High triglycerides    Hyperglycemia    Hypertension    Hyperthyroidism    Moderate asthma    Obesity    Paresthesia    Premature birth    Past Surgical History:  Procedure Laterality Date   BARTHOLIN GLAND CYST REMOVAL     COCHLEAR IMPLANT     COCHLEAR IMPLANT REMOVAL     DILATATION AND CURETTAGE/HYSTEROSCOPY WITH MINERVA N/A 03/05/2021   Procedure: DILATATION AND CURETTAGE/HYSTEROSCOPY WITH MINERVA;  Surgeon: Hildred Laser, MD;  Location: [facility];  Service: Gynecology;  Laterality: N/A;   LAPAROSCOPIC TUBAL LIGATION Bilateral 03/26/2018   Procedure: LAPAROSCOPIC TUBAL LIGATION;  Surgeon: Hildred Laser, MD;  Location: [facility];  Service: Gynecology;  Laterality: Bilateral;  with peritoneal biopsies   LAPAROSCOPY     MYRINGOTOMY WITH TUBE PLACEMENT     TUBAL LIGATION  03/26/2018   Patient Active Problem List   Diagnosis Date Noted    Chronic pain syndrome 03/03/2023   Nausea 08/12/2022   Dyslipidemia 02/10/2022   Vitamin D deficiency 02/10/2022   Positive ANA (antinuclear antibody) 12/20/2021   Fibromyalgia 12/20/2021   Sleep difficulties 04/30/2020   Right sided numbness 12/26/2019   RLS (restless legs syndrome) 09/30/2019   MDD (major depressive disorder), recurrent episode, moderate (HCC) 07/17/2019   Social anxiety disorder 07/17/2019   Muteness 09/10/2018   OSA (obstructive sleep apnea) 10/10/2016   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   Dissocial personality disorder (HCC) 06/07/2016   Sleep disturbance 05/10/2016   Chronic migraine without aura 03/29/2016   Palpitations 06/09/2015   Deaf 05/16/2015   B12 deficiency 05/16/2015   Cochlear implant status 05/16/2015   Fatty infiltration of liver 05/16/2015   Gastro-esophageal reflux disease without esophagitis 05/16/2015   Generalized headache 05/16/2015   Right-sided Bell's palsy 05/16/2015   Hypertriglyceridemia 05/16/2015   Irregular menstrual cycle 05/16/2015   Overweight 05/16/2015   Paresthesia 05/16/2015   Disorder of labyrinth 05/16/2015   Migraine 08/05/2013   Allergic rhinitis 07/01/2013   Obesity (BMI 30-39.9) 07/01/2013   Bartholin gland cyst 09/16/2008    PCP: Alba Cory, MD  REFERRING PROVIDER: Angelina Sheriff,  DO  REFERRING DIAG: G89.4 (ICD-10-CM) - Chronic pain syndrome M79.7 (ICD-10-CM) - Fibromyalgia  THERAPY DIAG:  Other abnormalities of gait and mobility  Muscle weakness (generalized)  Cramp and spasm  Dizziness and giddiness  Generalized pain  Rationale for Evaluation and Treatment: Rehabilitation  ONSET DATE: Has been around since Middle School, but has gotten worse over the past years  SUBJECTIVE:   SUBJECTIVE STATEMENT: Pt reports that she has been riding her stationary bike at home.  Reports that she felt great relief from pain with dry needling and wants to continue with that.  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  Patient states that she goes for Acupuncture in July.  Wants to continue with dry needling in PT sessions.  Sign Language Interpreter present throughout  PERTINENT HISTORY: Fibromyalgia, restless leg syndrome, deaf with cochlear implants (patient does not utilize), chronic migraines, chronic pain syndrome, Hx of right sided Bell's Palsy PAIN:  Are you having pain? Yes: NPRS scale: 4/10 Pain location: cervical, hips, back Pain description: crampy, sometimes sharp Aggravating factors: unknown Relieving factors: unknown  PRECAUTIONS: None  WEIGHT BEARING RESTRICTIONS: No  FALLS:  Has patient fallen in last 6 months? Yes. Number of falls 2-3x/week, states that she typically loses balance on left side  LIVING ENVIRONMENT: Lives with: lives with their family Lives in: House/apartment Stairs:  2 level home, her bedroom on the main level. Has following equipment at home: Ramped entry  PLOF: Independent and Leisure: learning new things, crafting, knitting, crochet, reading books  PATIENT GOALS: To be able to walk more than 15 minutes without back pain.  NEXT MD VISIT: PCP on Mar 30, 2023  OBJECTIVE:   DIAGNOSTIC FINDINGS: n/a  PATIENT SURVEYS:  03/10/2023:  LEFS 32 / 80 = 40.0 %  COGNITION: Overall cognitive status: Within functional limits for tasks assessed     SENSATION: Reports numbness and tingling on right side of the body, numbness and tingling on left hand   MUSCLE LENGTH: Hamstrings: tightness noted  POSTURE: rounded shoulders, forward head, and anterior pelvic tilt  PALPATION: Tenderness to palpation  LOWER EXTREMITY ROM:  03/10/2023:  WFL  LOWER EXTREMITY MMT:  03/10/2023:  Right hip strength 4/5, Left hip 4 to 4+/5 Right UE strength of 4/5 Left UE strength WFL  LOWER EXTREMITY SPECIAL TESTS:  4/12/024:  slump test positive  (right > left)  FUNCTIONAL TESTS:  Eval: 5 times sit to stand: 19.74 seconds with hands pushing up from thighs and reports of increased back pain Single Leg Stance:  greater than 30 seconds on each side  03/15/2023: 6 minute walk test:  338 ft with patient stopping test early secondary to increased pain.  GAIT: Distance walked: in clinic Assistive device utilized: None Level of assistance: Complete Independence Comments: Pt reports that in general, she can walk between 5-10 minutes before she starts having increased pain.   TODAY'S TREATMENT:  DATE: 04/26/2023 Trigger Point Dry-Needling  Treatment instructions: Expect mild to moderate muscle soreness. S/S of pneumothorax if dry needled over a lung field, and to seek immediate medical attention should they occur. Patient verbalized understanding of these instructions and education. Patient Consent Given: Yes Education handout provided: Previously provided Muscles treated: bilateral lumbar and thoracic multifidi, bilateral piriformis Electrical stimulation performed: No Parameters: N/A Treatment response/outcome: Utilized skilled palpation to locate bony landmarks and trigger points.  Able to illicit twitch response and muscle elongation following.  Manual Therapy:  soft tissue mobilization to bilateral lumbar paraspinals to further encourage muscle elongation and decreased spasms.   DATE: 04/13/2023 Trigger Point Dry-Needling  Treatment instructions: Expect mild to moderate muscle soreness. S/S of pneumothorax if dry needled over a lung field, and to seek immediate medical attention should they occur. Patient verbalized understanding of these instructions and education. Patient Consent Given: Yes Education handout provided: Previously provided Muscles treated: bilateral lumbar multifidi Electrical stimulation  performed: No Parameters: N/A Treatment response/outcome: Utilized skilled palpation to locate bony landmarks and trigger points.  Able to illicit twitch response and muscle elongation following.  Manual Therapy:  soft tissue mobilization to bilateral lumbar paraspinals to further encourage muscle elongation and decreased spasms.   DATE: 03/21/23 Warm-up walking: slow pace x3 min around clinic  Supine hamstring stretch with strap 2x20 sec each Supine figure 4 position x20 sec each Attempted seated butterfly stretch, but pt didn't feel this in the correct place Attempted standing adductor stretch x20 sec each side Supine bridge x8 reps (+) increase in low back pain Supine abdominal bracing with tactile/verbal cuing to increase understanding of proper form Supine abdominal bracing with bent knee march x10 reps Discussed ways to decrease back pain with cat/cow stretch at home; use of tennis ball for soft tissue mobilization     PATIENT EDUCATION:  Education details: Issued HEP Person educated: Patient Education method: Explanation, Demonstration, and Handouts Education comprehension: verbalized understanding  HOME EXERCISE PROGRAM: Access Code: [email or web address] URL: [email or web address] Date: 03/21/2023 Prepared by: [facility]  Exercises - Supine Hamstring Stretch with Strap  - 1 x daily - 7 x weekly - 1 sets - 2 reps - 20 sec hold - Supine Lower Trunk Rotation  - 1 x daily - 7 x weekly - 1 sets - 10 reps - Walking Tandem Stance  - 1 x daily - 7 x weekly - 3 sets - 10 reps - Hooklying Sequential Leg March and Lower  - 1 x daily - 7 x weekly - 3 sets - 10 reps - Cat Cow  - 1 x daily - 7 x weekly - 3 sets - 10 reps  ASSESSMENT:  CLINICAL IMPRESSION: Yesenia Wood presents to skilled PT reporting that she felt great relief of pain with dry needling last session for approximately 10 days.  Patient reports that she tried to do some of  her exercises, but those are still increasing her pain.  Patient able to progress with dry needling more muscles today.  Following dry needling with manual therapy to assist with muscle elongation and decreased spasms following manual therapy.  Patient will present next week for assessment and follow up to assess if PT is going to continue or discharge with patient to have acupuncture treatment.  OBJECTIVE IMPAIRMENTS: decreased balance, difficulty walking, decreased strength, dizziness, postural dysfunction, and pain.   ACTIVITY LIMITATIONS: sitting, squatting, stairs, and locomotion level  PARTICIPATION LIMITATIONS: community activity  PERSONAL FACTORS: Past/current experiences, Time  since onset of injury/illness/exacerbation, and 3+ comorbidities: chronic pain syndrome, fibromyalgia, chronic migraines  are also affecting patient's functional outcome.   REHAB POTENTIAL: Good  CLINICAL DECISION MAKING: Evolving/moderate complexity  EVALUATION COMPLEXITY: Moderate   GOALS: Goals reviewed with patient? Yes  SHORT TERM GOALS: Target date: 03/31/2023  Patient will be independent with initial HEP. Baseline: Goal status: MET  2.  Patient will participate in a 6 min walk test to obtain a baseline functional measurement. Baseline:  Goal status: MET on 03/15/2023   LONG TERM GOALS: Target date: 05/05/2023  Patient will be independent with advanced HEP. Baseline:  Goal status: INITIAL  2.  Patient will report ability to be able to walk for at least 20 minutes without reports of increased pain to allow her to go out in the community. Baseline:  Goal status: INITIAL  3.  Patient will report at least a 50% decrease in the frequency of falls and dizziness to decrease risk of injury. Baseline:  Goal status: INITIAL  4.  Patient will increase UE/LE strength to at least 4+/5 grossly throughout to improve functional strength and ability to complete tasks. Baseline:  Goal status:  INITIAL  5.  Patient will increase Lower Extremity Functional Scale to at least 55% to demonstrate improvements in functional mobility. Baseline: 40% Goal status: INITIAL  6.  Patient will improve time on 5 times sit to/from stand to less than 15 seconds to demonstrate improved power production. Baseline: 19.74 sec with hands pressing up from thighs Goal status: INITIAL   PLAN:  PT FREQUENCY: 1-2x/week  PT DURATION: 8 weeks  PLANNED INTERVENTIONS: Therapeutic exercises, Therapeutic activity, Neuromuscular re-education, Balance training, Gait training, Patient/Family education, Self Care, Joint mobilization, Joint manipulation, Stair training, Vestibular training, Canalith repositioning, Aquatic Therapy, Dry Needling, Electrical stimulation, Spinal manipulation, Spinal mobilization, Cryotherapy, Moist heat, Taping, Traction, Ultrasound, Ionotophoresis 4mg /ml Dexamethasone, Manual therapy, and Re-evaluation  PLAN FOR NEXT SESSION: reassessment, dry needling/manual therapy as indicated.   Reather Laurence, PT 04/26/23 11:45 AM  [facility] [address], [address] [place], [place] [address] Phone # [phone] Fax [phone]

## 2023-04-27 ENCOUNTER — Encounter: Payer: Medicare Other | Admitting: Rehabilitative and Restorative Service Providers"

## 2023-04-27 DIAGNOSIS — G43719 Chronic migraine without aura, intractable, without status migrainosus: Secondary | ICD-10-CM | POA: Diagnosis not present

## 2023-04-27 DIAGNOSIS — G43109 Migraine with aura, not intractable, without status migrainosus: Secondary | ICD-10-CM | POA: Diagnosis not present

## 2023-04-27 DIAGNOSIS — G518 Other disorders of facial nerve: Secondary | ICD-10-CM | POA: Diagnosis not present

## 2023-04-27 DIAGNOSIS — M791 Myalgia, unspecified site: Secondary | ICD-10-CM | POA: Diagnosis not present

## 2023-04-27 DIAGNOSIS — M542 Cervicalgia: Secondary | ICD-10-CM | POA: Diagnosis not present

## 2023-05-01 ENCOUNTER — Encounter: Payer: Self-pay | Admitting: Physical Medicine and Rehabilitation

## 2023-05-01 ENCOUNTER — Encounter
Payer: Medicare Other | Attending: Physical Medicine and Rehabilitation | Admitting: Physical Medicine and Rehabilitation

## 2023-05-01 VITALS — BP 103/70 | HR 69 | Ht 63.0 in | Wt 183.6 lb

## 2023-05-01 DIAGNOSIS — F5105 Insomnia due to other mental disorder: Secondary | ICD-10-CM | POA: Diagnosis not present

## 2023-05-01 DIAGNOSIS — M797 Fibromyalgia: Secondary | ICD-10-CM | POA: Diagnosis not present

## 2023-05-01 DIAGNOSIS — S93402A Sprain of unspecified ligament of left ankle, initial encounter: Secondary | ICD-10-CM | POA: Insufficient documentation

## 2023-05-01 DIAGNOSIS — S93492S Sprain of other ligament of left ankle, sequela: Secondary | ICD-10-CM | POA: Insufficient documentation

## 2023-05-01 MED ORDER — AMITRIPTYLINE HCL 25 MG PO TABS: 25.0000 mg | ORAL_TABLET | Freq: Every evening | ORAL | 3 refills | Status: DC | PRN

## 2023-05-01 NOTE — Progress Notes (Signed)
Subjective:    Patient ID: Yesenia Wood, female    DOB: December 26, 1985, 37 y.o.   MRN: [id number]  HPI     Yesenia Wood is a 37 y.o. year old female  who  has a past medical history of ADHD (attention deficit hyperactivity disorder), B12 deficiency, Bell's palsy, Chest pain, Chronic migraine, Deaf, Depression, Dissocial personality disorder (HCC), Fatigue, Fatty liver, Fibromyalgia, GERD (gastroesophageal reflux disease), Headache, High triglycerides, Hyperglycemia, Hypertension, Hyperthyroidism, Moderate asthma, Obesity, Paresthesia, and Premature birth.   They are presenting to PM&R clinic for follow up related to fibromyalgia pain .   Interval Hx:  - Therapies: Did PT; made her pain much worse. Has not been able to get into the pool because she needs to buy a swimsuit. Her next PT is Thursday.   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 Xray looked ok. This was May 5th. She has been elevating and soaking it in cool and warm water.    - Follow ups: Did dry needling with Dr. Wynn Banker; is starting accupuncture with him again in July. She thinks the dry needling helped in her back but not in her hips. She thinks it lasted about 10 days the first time and this last time 5 days.    - DME: none; does not use ankle braces. Uses boots at work with high lace up.    - Medications: CoQ10 100 mg caused diarrhea; she is stopping for now. Did not notice any effect on her pain.   She was started on naproxen with benadryll occasionally for period pain. She says she does get tired with benadryll but can take it at nighttime with minimal side effects. She is interested in trying Duloxetine again with the Bendryll. Has not tried Elavil in past.     - Other concerns: Sleep 'I'm not"; sometimes she takes unisom if she can't sleep 2-3 times a week; she estimates she is getting 2 hours x 3-4 times per night.    Pain Inventory Average Pain 5 Pain Right Now 3 My pain is sharp, tingling, and aching  In the last 24 hours, has pain interfered with the following? General activity 7 Relation with others 3 Enjoyment of life 6 What TIME of day is your pain at its worst? varies Sleep (in general) Fair  Pain is worse with: walking, bending, standing, and some activites Pain improves with: rest Relief from Meds:  na  Family History  Adopted: Yes  Family history unknown: Yes   Social History   Socioeconomic History   Marital status: Single    Spouse name: Not on file   Number of children: 0   Years of education: Not on file   Highest education level: Some college, no degree  Occupational History   Occupation: disability for hearing loss  Tobacco Use   Smoking status: Former    Packs/day: 1.00    Years: 2.00    Additional pack years: 0.00    Total pack years: 2.00    Types: Cigarettes    Start date: 11/28/2004    Quit date: 11/28/2006    Years since quitting: 16.4   Smokeless tobacco: Never  Vaping Use   Vaping Use: Never used  Substance and Sexual Activity   Alcohol use: Yes    Comment: 1 every 3 months   Drug use: No   Sexual activity:  Not Currently    Birth control/protection: Pill    Comment: Seasonale  Other Topics Concern   Not on file  Social History Narrative   Lives with her adopted  parents   Hearing loss since birth    Social Determinants of Health   Financial Resource Strain: Low Risk  (03/30/2023)   Overall Financial Resource Strain (CARDIA)    Difficulty of Paying Living Expenses: Not hard at all  Food Insecurity: No Food Insecurity (03/30/2023)   Hunger Vital Sign    Worried About Running Out of Food in the Last Year: Never true    Ran Out of Food in the Last Year: Never true  Transportation Needs: No Transportation Needs (03/30/2023)   PRAPARE - Administrator, Civil Service (Medical): No    Lack of Transportation (Non-Medical): No  Physical  Activity: Sufficiently Active (03/30/2023)   Exercise Vital Sign    Days of Exercise per Week: 7 days    Minutes of Exercise per Session: 30 min  Stress: No Stress Concern Present (03/30/2023)   [employer] of Occupational Health - Occupational Stress Questionnaire    Feeling of Stress : Only a little  Social Connections: Socially Isolated (03/30/2023)   Social Connection and Isolation Panel [NHANES]    Frequency of Communication with Friends and Family: Once a week    Frequency of Social Gatherings with Friends and Family: Once a week    Attends Religious Services: Never    Database administrator or Organizations: Yes    Attends Engineer, structural: Never    Marital Status: Never married   Past Surgical History:  Procedure Laterality Date   BARTHOLIN GLAND CYST REMOVAL     COCHLEAR IMPLANT     COCHLEAR IMPLANT REMOVAL     DILATATION AND CURETTAGE/HYSTEROSCOPY WITH MINERVA N/A 03/05/2021   Procedure: DILATATION AND CURETTAGE/HYSTEROSCOPY WITH MINERVA;  Surgeon: Hildred Laser, MD;  Location: [facility];  Service: Gynecology;  Laterality: N/A;   LAPAROSCOPIC TUBAL LIGATION Bilateral 03/26/2018   Procedure: LAPAROSCOPIC TUBAL LIGATION;  Surgeon: Hildred Laser, MD;  Location: [facility];  Service: Gynecology;  Laterality: Bilateral;  with peritoneal biopsies   LAPAROSCOPY     MYRINGOTOMY WITH TUBE PLACEMENT     TUBAL LIGATION  03/26/2018   Past Surgical History:  Procedure Laterality Date   BARTHOLIN GLAND CYST REMOVAL     COCHLEAR IMPLANT     COCHLEAR IMPLANT REMOVAL     DILATATION AND CURETTAGE/HYSTEROSCOPY WITH MINERVA N/A 03/05/2021   Procedure: DILATATION AND CURETTAGE/HYSTEROSCOPY WITH MINERVA;  Surgeon: Hildred Laser, MD;  Location: [facility];  Service: Gynecology;  Laterality: N/A;   LAPAROSCOPIC TUBAL LIGATION Bilateral 03/26/2018   Procedure: LAPAROSCOPIC TUBAL LIGATION;  Surgeon: Hildred Laser, MD;  Location: [facility];  Service: Gynecology;  Laterality: Bilateral;  with  peritoneal biopsies   LAPAROSCOPY     MYRINGOTOMY WITH TUBE PLACEMENT     TUBAL LIGATION  03/26/2018   Past Medical History:  Diagnosis Date   ADHD (attention deficit hyperactivity disorder)    B12 deficiency    Bell's palsy    right sided   Chest pain    a. 05/2015 ETT: elev HR/BP, but no acute ST/T changes; b. 09/2019 Cor CTA: Nl cors, Ca2+ = 0.   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   Fatigue    Fatty liver    Fibromyalgia  GERD (gastroesophageal reflux disease)    Headache    High triglycerides    Hyperglycemia    Hypertension    Hyperthyroidism    Moderate asthma    Obesity    Paresthesia    Premature birth    BP 103/70   Pulse 69   Ht 5\' 3"  (1.6 m)   Wt 183 lb 9.6 oz (83.3 kg)   SpO2 96%   BMI 32.52 kg/m   Opioid Risk Score:   Fall Risk Score:  `1  Depression screen [facility] 2/9     05/01/2023   11:55 AM 04/07/2023   10:20 AM 03/30/2023   10:01 AM 03/03/2023   10:13 AM 02/27/2023   10:30 AM 02/10/2023   10:14 AM 10/24/2022   11:15 AM  Depression screen PHQ 2/9  Decreased Interest 1 2 2  1 1    Down, Depressed, Hopeless 1 1 3  1 1    PHQ - 2 Score 2 3 5  2 2    Altered sleeping  3 3  2 3    Tired, decreased energy  2 2  3 3    Change in appetite  1 1  0 1   Feeling bad or failure about yourself   2 0  1 1   Trouble concentrating  1 1  1 1    Moving slowly or fidgety/restless  0 0  1 1   Suicidal thoughts  0 0  0 0   PHQ-9 Score  12 12  10 12    Difficult doing work/chores  Somewhat difficult    Somewhat difficult      Information is confidential and restricted. Go to Review Flowsheets to unlock data.     Review of Systems  Constitutional: Negative.   HENT: Negative.    Eyes: Negative.   Respiratory: Negative.    Cardiovascular: Negative.   Gastrointestinal: Negative.   Endocrine: Negative.   Genitourinary: Negative.   Musculoskeletal:  Positive for back pain.        Joint pain bilateral- wrists hips knees ankles elbows shoulders  Skin: Negative.   Allergic/Immunologic: Negative.   Neurological: Negative.   Hematological: Negative.   Psychiatric/Behavioral:  Positive for dysphoric mood.   All other systems reviewed and are negative.     Objective:   Physical Exam   Expand All Collapse All    Subjective:      Subjective  Patient ID: JACQUELI PANGALLO, female    DOB: 08/20/1986, 37 y.o.   MRN: [id number]   HPI   HPI   ADELIN VENTRELLA is a 37 y.o. year old female  who  has a past medical history of ADHD (attention deficit hyperactivity disorder), B12 deficiency, Bell's palsy, Chest pain, Chronic migraine, Deaf, Depression, Dissocial personality disorder, Fatigue, Fatty liver, Fibromyalgia, GERD (gastroesophageal reflux disease), Headache, High triglycerides, Hyperglycemia, Hypertension, Hyperthyroidism, Moderate asthma, Obesity, Paresthesia, and Premature birth.   They are presenting to PM&R clinic as a new patient for pain management evaluation. They were referred by Alba Cory, MD for treatment of fibromyalgia pain.    Source: Back of head>back>hips, also knees and elbows +R sided numbness/tingling/sensitivity in face, arms and legs; has Hx L Bell's palsy, but no Hx stroke/SCI, does seem to be positional in RUE and ossassionally will get sudden weakness. No LE sudden weakness, no falls.   Inciting incident: none    Red flag symptoms: No red flags for back pain endorsed in Hx or ROS   Medications tried: Topical medications (mild effect) :  Tried various creams, Biofreeze works ok "wherever is bothering me" Nsaids (no effect) : Ibuprofen for her teeth in the past; never been on prescription NSAID Tylenol  (no effect) : Hasn't takes for a few weeks Opiates  (no effect) : Was on tramadol before, not sure of s/e, no effect on pain Gabapentin / Lyrica  (no effect) : Lyrica in the past, without benefit. Thinks it caused her to have itching.  Gabapentin caused nausea/vomitting.  TCAs  ( allergy ) : No; was on nortryptilline in the past, has allergy, not sure what.  SNRIs  (never tried) : Documented allergy to Duloxetine, states was a rash Other  ( none ) :    Other treatments: PT/OT  (no effect) : Last in January 2024; was not helpful and made her pain worse. Never tried aquatherapy - swimming made it worse (more sore and tense).  Accupuncture/chiropractor/massage  (no effect) : Chiropractor in high school - lasted 1-2 days; dry needling at some point. Massage and body wraps. 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  Other  (no effect) : Yoga    Goals for pain control: "Walking without any pain"; can walk for 15 minutes. And cycling on a stationary bike - better than walking. 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    +Hx depression, worsening d/t pain. No anxiety. 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 Over the counter, was recommended unisom, works a little.    Prior UDS results:  Labs (Brief)  No results found for: "LABOPIA", "COCAINSCRNUR", "LABBENZ", "AMPHETMU", "THCU", "LABBARB"      Pain Inventory Average Pain 4 Pain Right Now 3 My pain is intermittent, constant, sharp, dull, stabbing, tingling, and aching   In the last 24 hours, has pain interfered with the following? General activity 5 Relation with others 0 Enjoyment of life 0 What TIME of day is your pain at its worst? morning , daytime, evening, night, and varies Sleep (in general) Fair   Pain is worse with: walking, standing, and some activites Pain improves with: rest Relief from Meds: 1   how many minutes can you walk? 5-15 ability to climb steps?  yes do you drive?  yes Do you have any goals in this area?  yes   disabled: date disabled 09-23-1986 Do you have any goals in this area?  yes   bladder control  problems weakness numbness tremor tingling trouble walking dizziness anxiety loss of taste or smell   Any changes since last visit?  no   Any changes since last visit?  no       Family History  Adopted: Yes  Family history unknown: Yes    Social History         Socioeconomic History   Marital status: Single      Spouse name: Not on file   Number of children: 0   Years of education: Not on file   Highest education level: Some college, no degree  Occupational History   Occupation: disability for hearing loss  Tobacco Use   Smoking status: Former      Packs/day: 1.00      Years: 2.00      Additional pack years: 0.00      Total pack years: 2.00      Types: Cigarettes      Start date: 11/28/2004      Quit date: 11/28/2006  Years since quitting: 16.2   Smokeless tobacco: Never  Vaping Use   Vaping Use: Never used  Substance and Sexual Activity   Alcohol use: Yes      Comment: 1 every 3 months   Drug use: No   Sexual activity: Not Currently      Birth control/protection: Pill      Comment: Seasonale  Other Topics Concern   Not on file  Social History Narrative    Lives with her adopted  parents    Hearing loss since birth     Social Determinants of Health        Financial Resource Strain: Low Risk  (02/10/2022)    Overall Financial Resource Strain (CARDIA)     Difficulty of Paying Living Expenses: Not very hard  Food Insecurity: No Food Insecurity (02/10/2022)    Hunger Vital Sign     Worried About Running Out of Food in the Last Year: Never true     Ran Out of Food in the Last Year: Never true  Transportation Needs: No Transportation Needs (02/10/2022)    PRAPARE - Therapist, art (Medical): No     Lack of Transportation (Non-Medical): No  Physical Activity: Insufficiently Active (02/10/2022)    Exercise Vital Sign     Days of Exercise per Week: 7 days     Minutes of Exercise per Session: 20 min  Stress: No Stress Concern  Present (02/10/2022)    [employer] of Occupational Health - Occupational Stress Questionnaire     Feeling of Stress : Only a little  Social Connections: Socially Isolated (02/10/2022)    Social Connection and Isolation Panel [NHANES]     Frequency of Communication with Friends and Family: More than three times a week     Frequency of Social Gatherings with Friends and Family: Never     Attends Religious Services: Never     Database administrator or Organizations: No     Attends Engineer, structural: Never     Marital Status: Never married         Past Surgical History:  Procedure Laterality Date   BARTHOLIN GLAND CYST REMOVAL       COCHLEAR IMPLANT       COCHLEAR IMPLANT REMOVAL       DILATATION AND CURETTAGE/HYSTEROSCOPY WITH MINERVA N/A 03/05/2021    Procedure: DILATATION AND CURETTAGE/HYSTEROSCOPY WITH MINERVA;  Surgeon: Hildred Laser, MD;  Location: [facility];  Service: Gynecology;  Laterality: N/A;   LAPAROSCOPIC TUBAL LIGATION Bilateral 03/26/2018    Procedure: LAPAROSCOPIC TUBAL LIGATION;  Surgeon: Hildred Laser, MD;  Location: [facility];  Service: Gynecology;  Laterality: Bilateral;  with peritoneal biopsies   LAPAROSCOPY       MYRINGOTOMY WITH TUBE PLACEMENT       TUBAL LIGATION   03/26/2018        Past Medical History:  Diagnosis Date   ADHD (attention deficit hyperactivity disorder)     B12 deficiency     Bell's palsy      right sided   Chest pain      a. 05/2015 ETT: elev HR/BP, but no acute ST/T changes; b. 09/2019 Cor CTA: Nl cors, Ca2+ = 0.   Chronic migraine     Deaf     Depression     Dissocial personality disorder [facility])      three different personalities documented by PCP   Fatigue     Fatty liver  Fibromyalgia     GERD (gastroesophageal reflux disease)     Headache     High triglycerides     Hyperglycemia     Hypertension     Hyperthyroidism     Moderate asthma     Obesity     Paresthesia     Premature birth      There were no vitals  taken for this visit.   Opioid Risk Score:   Fall Risk Score:  `1   Depression screen PHQ 2/9       02/10/2023   10:14 AM 10/24/2022   11:15 AM 08/19/2022   11:22 AM 08/12/2022    9:51 AM 06/28/2022    1:54 PM 03/08/2022   11:29 AM 02/10/2022   11:19 AM  Depression screen PHQ 2/9  Decreased Interest 1  1 1   2   Down, Depressed, Hopeless 1  1 1   1   PHQ - 2 Score 2  2 2   3   Altered sleeping 3  3 3   3   Tired, decreased energy 3  3 3   2   Change in appetite 1  0 0   1  Feeling bad or failure about yourself  1  0 0   1  Trouble concentrating 1  1 1   2   Moving slowly or fidgety/restless 1  0 0   2  Suicidal thoughts 0  0 0   0  PHQ-9 Score 12  9 9   14   Difficult doing work/chores Somewhat difficult         Somewhat difficult     Information is confidential and restricted. Go to Review Flowsheets to unlock data.    Review of Systems  Respiratory:  Positive for shortness of breath.   Gastrointestinal:  Positive for abdominal pain, constipation and nausea.  Musculoskeletal:  Positive for back pain, gait problem and neck pain.       Pain in both hips.both feet & ankles &  Both shoulders, both wrists & hands Off balance  Neurological:  Positive for dizziness, tremors, weakness and numbness.  Psychiatric/Behavioral:         Anxiety  All other systems reviewed and are negative.         Objective:    Objective [] Expand by Default Physical Exam     PE: Constitution: Appropriate appearance for age. No apparent distress  +Obese Resp: No respiratory distress. No accessory muscle usage. on RA Cardio: Well perfused appearance. No peripheral edema. Abdomen: Nondistended. Nontender.   Psych: Appropriate mood and affect. Neuro: AAOx4. No apparent cognitive deficits +Deafness   Neurologic Exam:   Sensory exam:  + R elbow Tinel's   Motor exam: strength 5/5 throughout bilateral upper extremities and bilateral lower extremities Coordination: Fine motor coordination was normal.    Gait: normal     Back Exam:   Inspection: Pelvis was  even.  Lumbar lordotic curvature was  wnl .  There was  no evidence of scoliosis.  Palpation: Palpatory exam revealed + TTP b/l paraspinals, trapezius, shoulders ,PSIS, SI joints, and greater trochanters . There was  no evidence of spasm.             Assessment & Plan:   MICHAELIA PERMANN is a 37 y.o. year old female  who  has a past medical history of ADHD (attention deficit hyperactivity disorder), B12 deficiency, Bell's palsy, Chest pain, Chronic migraine, Deaf, Depression, Dissocial personality disorder (HCC), Fatigue, Fatty liver, Fibromyalgia, GERD (gastroesophageal  reflux disease), Headache, High triglycerides, Hyperglycemia, Hypertension, Hyperthyroidism, Moderate asthma, Obesity, Paresthesia, and Premature birth.   They are presenting to PM&R clinic as a new patient for treatment of fibromyalgia pain .    Fibromyalgia Insomnia  Start Elavil 25 mg at night for pain and insomnia.  You can take this with Benadryl for 1 week to ensure no reaction, then would stop Benadryl and see if you can take the medication without it.  If you have any issues or concerns, please let the clinic know.  After 2 weeks, message me through MyChart and let me know if the medication is working for you or if any side effects.  Continue physical therapy, and is obtaining swimsuits you can start aqua therapy.  Follow-up with me in 6 to 8 weeks  Sprain of other ligament of left ankle, sequela Obtain Lidoderm or Aspercreme patches over-the-counter, or any patch that integrates lidocaine as the primary ingredient.  Apply these to the outside of your left ankle under clothing in the morning to help with pain.  You can leave the patches on for up to 12 hours.  Obtain an Ace wrap over-the-counter, and have your physical therapist on Thursday show you how to wrap the ankle for additional support and to help with edema.  At follow-up, if you are continuing to  have issues, we may refer you to orthopedics or get additional imaging

## 2023-05-01 NOTE — Patient Instructions (Signed)
  Fibromyalgia Insomnia  Start Elavil 25 mg at night for pain and insomnia.  You can take this with Benadryl for 1 week to ensure no reaction, then would stop Benadryl and see if you can take the medication without it.  If you have any issues or concerns, please let the clinic know.  After 2 weeks, message me through MyChart and let me know if the medication is working for you or if any side effects.  Continue physical therapy, and is obtaining swimsuits you can start aqua therapy.  Follow-up with me in 6 to 8 weeks  Sprain of other ligament of left ankle, sequela Obtain Lidoderm or Aspercreme patches over-the-counter, or any patch that integrates lidocaine as the primary ingredient.  Apply these to the outside of your left ankle under clothing in the morning to help with pain.  You can leave the patches on for up to 12 hours.  Obtain an Ace wrap over-the-counter, and have your physical therapist on Thursday show you how to wrap the ankle for additional support and to help with edema.  At follow-up, if you are continuing to have issues, we may refer you to orthopedics or get additional imaging

## 2023-05-03 ENCOUNTER — Encounter: Payer: Self-pay | Admitting: Physical Medicine and Rehabilitation

## 2023-05-03 DIAGNOSIS — M797 Fibromyalgia: Secondary | ICD-10-CM

## 2023-05-03 DIAGNOSIS — F5105 Insomnia due to other mental disorder: Secondary | ICD-10-CM

## 2023-05-03 MED ORDER — AMITRIPTYLINE HCL 10 MG PO TABS: 10.0000 mg | ORAL_TABLET | Freq: Every day | ORAL | 2 refills | Status: DC

## 2023-05-04 ENCOUNTER — Ambulatory Visit
Payer: Medicare Other | Attending: Physical Medicine and Rehabilitation | Admitting: Rehabilitative and Restorative Service Providers"

## 2023-05-04 ENCOUNTER — Encounter: Payer: Self-pay | Admitting: Rehabilitative and Restorative Service Providers"

## 2023-05-04 DIAGNOSIS — R42 Dizziness and giddiness: Secondary | ICD-10-CM | POA: Insufficient documentation

## 2023-05-04 DIAGNOSIS — R52 Pain, unspecified: Secondary | ICD-10-CM | POA: Diagnosis not present

## 2023-05-04 DIAGNOSIS — R252 Cramp and spasm: Secondary | ICD-10-CM | POA: Insufficient documentation

## 2023-05-04 DIAGNOSIS — R2689 Other abnormalities of gait and mobility: Secondary | ICD-10-CM | POA: Diagnosis not present

## 2023-05-04 DIAGNOSIS — M6281 Muscle weakness (generalized): Secondary | ICD-10-CM | POA: Diagnosis not present

## 2023-05-04 NOTE — Progress Notes (Signed)
    GYNECOLOGY PROGRESS NOTE  Subjective:    Patient ID: YAELIS SCHARFENBERG, female    DOB: 1985-12-08, 37 y.o.   MRN: [id number]  [facility] ASL Interpreter used for today's visit.   HPI  Patient is a 37 y.o. G0P0000 female who presents for follow up on medication. She was started on Orlissa for management of significant dysmenorrhea and DUB last month . Today she reports that she continues to take the Graball as prescribed, she continues to have dysfunctional uterine bleeding and continues to have pelvic pain, but a little more tolerable than it was.Does note that her last cycle still lasted 10 days (May 15-25), however was slightly lighter. Noting side effects of hot flushes.   The following portions of the patient's history were reviewed and updated as appropriate: allergies, current medications, past family history, past medical history, past social history, past surgical history, and problem list.  Review of Systems Pertinent items are noted in HPI.   Objective:   Blood pressure 110/74, pulse 79, resp. rate 16, height 5\' 3"  (1.6 m), weight 184 lb 11.2 oz (83.8 kg). Body mass index is 32.72 kg/m. General appearance: alert, cooperative, and no distress Remainder of exam deferred   Assessment:   1. Dysmenorrhea   2. Other endometriosis   3. DUB (dysfunctional uterine bleeding)      Plan:   - Continue use of Orilissa for now, current dosing 200 mg BID. Can reassess symptoms again in 4-5 month. Will need to reassess LFTs at that time.  - Advised that if hot flushes become very bothersome, can prescribe add back progesterone therapy or patient can trial OTC herbal supplements.  Patient would like to try herbal supplements at this time.  Given a list of options.   A total of 15 minutes were spent face-to-face with the patient during this encounter and over half of that time dealt with counseling and coordination of care.   Hildred Laser, MD [facility]

## 2023-05-04 NOTE — Therapy (Signed)
OUTPATIENT PHYSICAL THERAPY TREATMENT NOTE AND DISCHARGE SUMMARY   Patient Name: [name] MRN: [id number] DOB:[birth year], 37 y.o., female Today's Date: 05/04/2023  END OF SESSION:  PT End of Session - 05/04/23 1103     Visit Number 6    Date for PT Re-Evaluation 05/05/23    Authorization Type Medicare A    PT Start Time 1100    PT Stop Time 1140    PT Time Calculation (min) 40 min    Activity Tolerance Patient tolerated treatment well    Behavior During Therapy WFL for tasks assessed/performed              Past Medical History:  Diagnosis Date   ADHD (attention deficit hyperactivity disorder)    B12 deficiency    Bell's palsy    right sided   Chest pain    a. 05/2015 ETT: elev HR/BP, but no acute ST/T changes; b. 09/2019 Cor CTA: Nl cors, Ca2+ = 0.   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   Fatigue    Fatty liver    Fibromyalgia    GERD (gastroesophageal reflux disease)    Headache    High triglycerides    Hyperglycemia    Hypertension    Hyperthyroidism    Moderate asthma    Obesity    Paresthesia    Premature birth    Past Surgical History:  Procedure Laterality Date   BARTHOLIN GLAND CYST REMOVAL     COCHLEAR IMPLANT     COCHLEAR IMPLANT REMOVAL     DILATATION AND CURETTAGE/HYSTEROSCOPY WITH MINERVA N/A 03/05/2021   Procedure: DILATATION AND CURETTAGE/HYSTEROSCOPY WITH MINERVA;  Surgeon: Hildred Laser, MD;  Location: [facility] ORS;  Service: Gynecology;  Laterality: N/A;   LAPAROSCOPIC TUBAL LIGATION Bilateral 03/26/2018   Procedure: LAPAROSCOPIC TUBAL LIGATION;  Surgeon: Hildred Laser, MD;  Location: [facility] OR[facility];  Service: Gynecology;  Laterality: Bilateral;  with peritoneal biopsies   LAPAROSCOPY     MYRINGOTOMY WITH TUBE PLACEMENT     TUBAL LIGATION  03/26/2018   Patient Active Problem List   Diagnosis Date Noted   Sprain of left ankle 05/01/2023   Chronic  pain syndrome 03/03/2023   Nausea 08/12/2022   Dyslipidemia 02/10/2022   Vitamin D deficiency 02/10/2022   Positive ANA (antinuclear antibody) 12/20/2021   Fibromyalgia 12/20/2021   Sleep difficulties 04/30/2020   Right sided numbness 12/26/2019   RLS (restless legs syndrome) 09/30/2019   MDD (major depressive disorder), recurrent episode, moderate (HCC) 07/17/2019   Social anxiety disorder 07/17/2019   Muteness 09/10/2018   OSA (obstructive sleep apnea) 10/10/2016   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   Dissocial personality disorder (HCC) 06/07/2016   Sleep disturbance 05/10/2016   Chronic migraine without aura 03/29/2016   Palpitations 06/09/2015   Deaf 05/16/2015   B12 deficiency 05/16/2015   Cochlear implant status 05/16/2015   Fatty infiltration of liver 05/16/2015   Gastro-esophageal reflux disease without esophagitis 05/16/2015   Generalized headache 05/16/2015   Right-sided Bell's palsy 05/16/2015   Hypertriglyceridemia 05/16/2015   Irregular menstrual cycle 05/16/2015   Overweight 05/16/2015   Paresthesia 05/16/2015   Disorder of labyrinth 05/16/2015   Migraine 08/05/2013   Allergic rhinitis 07/01/2013   Obesity (BMI 30-39.9) 07/01/2013   Bartholin gland cyst 09/16/2008    PCP: Alba Cory, MD  REFERRING PROVIDER: Angelina Sheriff, DO  REFERRING DIAG: G89.4 (ICD-10-CM) -  Chronic pain syndrome M79.7 (ICD-10-CM) - Fibromyalgia  THERAPY DIAG:  Other abnormalities of gait and mobility  Muscle weakness (generalized)  Cramp and spasm  Dizziness and giddiness  Generalized pain  Rationale for Evaluation and Treatment: Rehabilitation  ONSET DATE: Has been around since Middle School, but has gotten worse over the past years  SUBJECTIVE:   SUBJECTIVE STATEMENT: Pt reports that the dry needling on the back seemed to help, but the glute/piriformis seemed more sore.  Patient states that she is ready for today to be her discharge visit.  Sign  Language Interpreter present throughout  PERTINENT HISTORY: Fibromyalgia, restless leg syndrome, deaf with cochlear implants (patient does not utilize), chronic migraines, chronic pain syndrome, Hx of right sided Bell's Palsy PAIN:  Are you having pain? Yes: NPRS scale: 4-5/10 Pain location: cervical, hips, back Pain description: crampy, sometimes sharp Aggravating factors: unknown Relieving factors: unknown  PRECAUTIONS: None  WEIGHT BEARING RESTRICTIONS: No  FALLS:  Has patient fallen in last 6 months? Yes. Number of falls 2-3x/week, states that she typically loses balance on left side  LIVING ENVIRONMENT: Lives with: lives with their family Lives in: House/apartment Stairs:  2 level home, her bedroom on the main level. Has following equipment at home: Ramped entry  PLOF: Independent and Leisure: learning new things, crafting, knitting, crochet, reading books  PATIENT GOALS: To be able to walk more than 15 minutes without back pain.  NEXT MD VISIT: PCP on Mar 30, 2023  OBJECTIVE:   DIAGNOSTIC FINDINGS: n/a  PATIENT SURVEYS:  03/10/2023:  LEFS 32 / 80 = 40.0 % 05/04/2023:  Lower Extremity Functional Score: 33 / 80 = 41.3 %  COGNITION: Overall cognitive status: Within functional limits for tasks assessed     SENSATION: Reports numbness and tingling on right side of the body, numbness and tingling on left hand   MUSCLE LENGTH: Hamstrings: tightness noted  POSTURE: rounded shoulders, forward head, and anterior pelvic tilt  PALPATION: Tenderness to palpation  LOWER EXTREMITY ROM:  03/10/2023:  WFL  LOWER EXTREMITY MMT:  03/10/2023:  Right hip strength 4/5, Left hip 4 to 4+/5 Right UE strength of 4/5 Left UE strength [facility]  05/04/2023: Bilateral LE strength is at least 4+/5 grossly throughout, but limited secondary to pain.  LOWER EXTREMITY SPECIAL TESTS:  4/12/024:  slump test positive (right > left)  FUNCTIONAL TESTS:  Eval: 5 times sit to stand: 19.74  seconds with hands pushing up from thighs and reports of increased back pain Single Leg Stance:  greater than 30 seconds on each side  03/15/2023: 6 minute walk test:  338 ft with patient stopping test early secondary to increased pain.  05/04/2023: 6 minute walk test: 820 ft with only one brief seated recovery period 5 times sit to stand:  18.27 seconds with only minimal pressing up from thighs  GAIT: Distance walked: in clinic Assistive device utilized: None Level of assistance: Complete Independence Comments: Pt reports that in general, she can walk between 5-10 minutes before she starts having increased pain.   TODAY'S TREATMENT:  DATE: 05/04/2023 6 minute walk test Sit to/from stand x5 LEFS Trigger Point Dry-Needling  Treatment instructions: Expect mild to moderate muscle soreness. S/S of pneumothorax if dry needled over a lung field, and to seek immediate medical attention should they occur. Patient verbalized understanding of these instructions and education. Patient Consent Given: Yes Education handout provided: Previously provided Muscles treated: bilateral lumbar multifidi Electrical stimulation performed: No Parameters: N/A Treatment response/outcome: Utilized skilled palpation to locate bony landmarks and trigger points.  Able to illicit twitch response and muscle elongation following.  Manual Therapy:  soft tissue mobilization to bilateral lumbar paraspinals to further encourage muscle elongation and decreased spasms.   DATE: 04/26/2023 Trigger Point Dry-Needling  Treatment instructions: Expect mild to moderate muscle soreness. S/S of pneumothorax if dry needled over a lung field, and to seek immediate medical attention should they occur. Patient verbalized understanding of these instructions and education. Patient Consent Given: Yes Education  handout provided: Previously provided Muscles treated: bilateral lumbar and thoracic multifidi, bilateral piriformis Electrical stimulation performed: No Parameters: N/A Treatment response/outcome: Utilized skilled palpation to locate bony landmarks and trigger points.  Able to illicit twitch response and muscle elongation following.  Manual Therapy:  soft tissue mobilization to bilateral lumbar paraspinals to further encourage muscle elongation and decreased spasms.   DATE: 04/13/2023 Trigger Point Dry-Needling  Treatment instructions: Expect mild to moderate muscle soreness. S/S of pneumothorax if dry needled over a lung field, and to seek immediate medical attention should they occur. Patient verbalized understanding of these instructions and education. Patient Consent Given: Yes Education handout provided: Previously provided Muscles treated: bilateral lumbar multifidi Electrical stimulation performed: No Parameters: N/A Treatment response/outcome: Utilized skilled palpation to locate bony landmarks and trigger points.  Able to illicit twitch response and muscle elongation following.  Manual Therapy:  soft tissue mobilization to bilateral lumbar paraspinals to further encourage muscle elongation and decreased spasms.     PATIENT EDUCATION:  Education details: Issued HEP Person educated: Patient Education method: Explanation, Facilities manager, and Handouts Education comprehension: verbalized understanding  HOME EXERCISE PROGRAM: Access Code: [email or web address] URL: [email or web address] Date: 03/21/2023 Prepared by: [facility] - Outpatient Rehab - [facility]  Exercises - Supine Hamstring Stretch with Strap  - 1 x daily - 7 x weekly - 1 sets - 2 reps - 20 sec hold - Supine Lower Trunk Rotation  - 1 x daily - 7 x weekly - 1 sets - 10 reps - Walking Tandem Stance  - 1 x daily - 7 x weekly - 3 sets - 10 reps - Hooklying Sequential Leg March and Lower  - 1 x daily -  7 x weekly - 3 sets - 10 reps - Cat Cow  - 1 x daily - 7 x weekly - 3 sets - 10 reps  ASSESSMENT:  CLINICAL IMPRESSION: Yesenia Wood presents to skilled PT reporting that she is ready for discharge from skilled PT at this time secondary to having a busy schedule at home and assisting with her grandmother.  Patient has made minimal improvements with LEFS, but has made a significant improvement with 6 minute walk test.  Patient continues to report decreased muscle tightness following lumbar dry needling and manual therapy.  Patient has not met goals, but discharged during visit today per request.  Patient verbalizes understanding to contact MD if she wants to try PT again in the future when her schedule is not as busy.  OBJECTIVE IMPAIRMENTS: decreased balance, difficulty walking, decreased strength, dizziness, postural dysfunction, and pain.  ACTIVITY LIMITATIONS: sitting, squatting, stairs, and locomotion level  PARTICIPATION LIMITATIONS: community activity  PERSONAL FACTORS: Past/current experiences, Time since onset of injury/illness/exacerbation, and 3+ comorbidities: chronic pain syndrome, fibromyalgia, chronic migraines  are also affecting patient's functional outcome.   REHAB POTENTIAL: Good  CLINICAL DECISION MAKING: Evolving/moderate complexity  EVALUATION COMPLEXITY: Moderate   GOALS: Goals reviewed with patient? Yes  SHORT TERM GOALS: Target date: 03/31/2023  Patient will be independent with initial HEP. Baseline: Goal status: MET  2.  Patient will participate in a 6 min walk test to obtain a baseline functional measurement. Baseline:  Goal status: MET on 03/15/2023   LONG TERM GOALS: Target date: 05/05/2023  Patient will be independent with advanced HEP. Baseline:  Goal status: MET  2.  Patient will report ability to be able to walk for at least 20 minutes without reports of increased pain to allow her to go out in the community. Baseline:  Goal status: PARTIALLY MET (6  minute walk test improved)  3.  Patient will report at least a 50% decrease in the frequency of falls and dizziness to decrease risk of injury. Baseline:  Goal status: MET  4.  Patient will increase UE/LE strength to at least 4+/5 grossly throughout to improve functional strength and ability to complete tasks. Baseline:  Goal status: MET  5.  Patient will increase Lower Extremity Functional Scale to at least 55% to demonstrate improvements in functional mobility. Baseline: 40% Goal status: NOT MET  6.  Patient will improve time on 5 times sit to/from stand to less than 15 seconds to demonstrate improved power production. Baseline: 19.74 sec with hands pressing up from thighs Goal status: NOT MET   PLAN:  PT FREQUENCY: 1-2x/week  PT DURATION: 8 weeks  PLANNED INTERVENTIONS: Therapeutic exercises, Therapeutic activity, Neuromuscular re-education, Balance training, Gait training, Patient/Family education, Self Care, Joint mobilization, Joint manipulation, Stair training, Vestibular training, Canalith repositioning, Aquatic Therapy, Dry Needling, Electrical stimulation, Spinal manipulation, Spinal mobilization, Cryotherapy, Moist heat, Taping, Traction, Ultrasound, Ionotophoresis 4mg /ml Dexamethasone, Manual therapy, and Re-evaluation   PHYSICAL THERAPY DISCHARGE SUMMARY  Patient agrees to discharge. Patient goals were not met. Patient is being discharged due to the patient's request.     Reather Laurence, PT 05/04/23 1:35 PM  [facility] [address] [place], [place] [address] Phone # [phone] Fax [phone]

## 2023-05-09 ENCOUNTER — Encounter: Payer: Self-pay | Admitting: Obstetrics and Gynecology

## 2023-05-09 ENCOUNTER — Ambulatory Visit (INDEPENDENT_AMBULATORY_CARE_PROVIDER_SITE_OTHER): Payer: Medicare Other | Admitting: Obstetrics and Gynecology

## 2023-05-09 VITALS — BP 110/74 | HR 79 | Resp 16 | Ht 63.0 in | Wt 184.7 lb

## 2023-05-09 DIAGNOSIS — N946 Dysmenorrhea, unspecified: Secondary | ICD-10-CM

## 2023-05-09 DIAGNOSIS — N938 Other specified abnormal uterine and vaginal bleeding: Secondary | ICD-10-CM | POA: Diagnosis not present

## 2023-05-09 DIAGNOSIS — N808 Other endometriosis: Secondary | ICD-10-CM

## 2023-05-24 ENCOUNTER — Ambulatory Visit: Payer: Self-pay | Admitting: *Deleted

## 2023-05-24 NOTE — Telephone Encounter (Signed)
Reason for Disposition  [1] MODERATE pain (e.g., interferes with normal activities) AND [2] pain comes and goes (cramps) AND [3] present > 24 hours  (Exception: Pain with Vomiting or Diarrhea - see that Guideline.)  Answer Assessment - Initial Assessment Questions 1. LOCATION: "Where does it hurt?"      My whole lower abd. 2. RADIATION: "Does the pain shoot anywhere else?" (e.g., chest, back)     No     I feel pressure in the anal area.   No constipation or diarrhea.   Having BMs more often than normal.    3. ONSET: "When did the pain begin?" (e.g., minutes, hours or days ago)      About 10 days.    I thought it was new medication.   So I stopped it.   It didn't make any difference.    4. SUDDEN: "Gradual or sudden onset?"     Not asked 5. PATTERN "Does the pain come and go, or is it constant?"    - If it comes and goes: "How long does it last?" "Do you have pain now?"     (Note: Comes and goes means the pain is intermittent. It goes away completely between bouts.)    - If constant: "Is it getting better, staying the same, or getting worse?"      (Note: Constant means the pain never goes away completely; most serious pain is constant and gets worse.)      Never had this happen before.    I have but not with the constant pain and going to the bathroom more often.   6. SEVERITY: "How bad is the pain?"  (e.g., Scale 1-10; mild, moderate, or severe)    - MILD (1-3): Doesn't interfere with normal activities, abdomen soft and not tender to touch.     - MODERATE (4-7): Interferes with normal activities or awakens from sleep, abdomen tender to touch.     - SEVERE (8-10): Excruciating pain, doubled over, unable to do any normal activities.       7/10 when I need to go to the bathroom.   2/10 otherwise. 7. RECURRENT SYMPTOM: "Have you ever had this type of stomach pain before?" If Yes, ask: "When was the last time?" and "What happened that time?"      Yes   see above I don't have an appetite.    8.  CAUSE: "What do you think is causing the stomach pain?"     I stopped the medication because I thought it was causing this but my symptoms did not change.   Elavil was what I stopped.   It was for sleep.    9. RELIEVING/AGGRAVATING FACTORS: "What makes it better or worse?" (e.g., antacids, bending or twisting motion, bowel movement)     Nothing 10. OTHER SYMPTOMS: "Do you have any other symptoms?" (e.g., back pain, diarrhea, fever, urination pain, vomiting)       Poor appetite.  Constant hurting but worse when I need to go to the bathroom for a BM.    I feel full. 11. PREGNANCY: "Is there any chance you are pregnant?" "When was your last menstrual period?"       No  My tubes are tied.  Protocols used: Abdominal Pain - Female-A-AH

## 2023-05-24 NOTE — Progress Notes (Unsigned)
There were no vitals taken for this visit.   Subjective:    Patient ID: Yesenia Wood, female    DOB: 09/13/1986, 37 y.o.   MRN: [id number]  HPI: Yesenia Wood is a 37 y.o. female  No chief complaint on file.  Abdominal Complaint:  -Duration: {Blank single:19197::"chronic","days","weeks","months"} -Frequency: {Blank single:19197::"constant","intermittent","occasional","rare","every few minutes","a few times a hour","a few times a day","a few times a week","a few times a month","a few times a year"} -Nature: {Blank multiple:19196::"bloating","sharp","dull","aching","burning","cramping","ill-defined","itchy","pressure-like","pulling","shooting","sore","stabbing","tender","tearing","throbbing"} -Location: {Blank multiple:19196::"diffuse","vague","LUQ","RUQ","epigastric","peri-umbilical","LLQ","RLQ","diffuse","suprapubic". "lower abdominal quadrants"}  -Severity: {Blank single:19197::"mild","moderate","severe","1/10","2/10","3/10","4/10","5/10","6/10","7/10","8/10","9/10","10/10"}  -Radiation: {Blank single:19197::"yes","no"} -Alleviating factors: *** -Aggravating factors: *** -Treatments attempted: {Blank multiple:19196::"none","antacids","PPI","H2 Blocker","laxatives"} -Constipation: {Blank single:19197::"yes","no","intermittent"} -Diarrhea: {Blank single:19197::"yes","no"} -Episodes of diarrhea/day: -Mucous in the stool: {Blank single:19197::"yes","no"} -Heartburn: {Blank single:19197::"yes","no"} -Bloating:{Blank single:19197::"yes","no"} -Passing Gas: {Blank single:19197::"yes","no"} -Nausea: {Blank single:19197::"yes","no"} -Vomiting: {Blank single:19197::"yes","no"} -Episodes of vomit/day: -Melena or hematochezia: {Blank single:19197::"yes","no"} -Rash: {Blank single:19197::"yes","no"} -Jaundice: {Blank single:19197::"yes","no"} -Fever: {Blank single:19197::"yes","no"} -Weight loss: {Blank single:19197::"yes","no"} -Change in Appetite: {Blank single:19197::"yes","no"}    Relevant past medical, surgical, family and social history reviewed and updated as indicated. Interim medical history since our last visit reviewed. Allergies and medications reviewed and updated.  Review of Systems  Constitutional: Negative for fever or weight change.  Respiratory: Negative for cough and shortness of breath.   Cardiovascular: Negative for chest pain or palpitations.  Gastrointestinal: Negative for abdominal pain, no bowel changes.  Musculoskeletal: Negative for gait problem or joint swelling.  Skin: Negative for rash.  Neurological: Negative for dizziness or headache.  No other specific complaints in a complete review of systems (except as listed in HPI above).      Objective:    There were no vitals taken for this visit.  Wt Readings from Last 3 Encounters:  05/09/23 184 lb 11.2 oz (83.8 kg)  05/01/23 183 lb 9.6 oz (83.3 kg)  04/21/23 181 lb 12.8 oz (82.5 kg)    Physical Exam  Constitutional: Patient appears well-developed and well-nourished. Obese *** No distress.  HEENT: head atraumatic, normocephalic, pupils equal and reactive to light, ears ***, neck supple, throat within normal limits Cardiovascular: Normal rate, regular rhythm and normal heart sounds.  No murmur heard. No BLE edema. Pulmonary/Chest: Effort normal and breath sounds normal. No respiratory distress. Abdominal: Soft.  There is no tenderness. Psychiatric: Patient has a normal mood and affect. behavior is normal. Judgment and thought content normal.   Results for orders placed or performed in visit on 02/10/23  Lipid panel  Result Value Ref Range   Cholesterol 126 <200 mg/dL   HDL 41 (L) > OR = 50 mg/dL   Triglycerides 034 <742 mg/dL   LDL Cholesterol (Calc) 62 mg/dL (calc)   Total CHOL/HDL Ratio 3.1 <5.0 (calc)   Non-HDL Cholesterol (Calc) 85 <595 mg/dL (calc)  CBC with Differential/Platelet  Result Value Ref Range   WBC 7.7 3.8 - 10.8 Thousand/uL   RBC 4.93 3.80 - 5.10 Million/uL    Hemoglobin 14.1 11.7 - 15.5 g/dL   HCT 63.8 75.6 - 43.3 %   MCV 87.8 80.0 - 100.0 fL   MCH 28.6 27.0 - 33.0 pg   MCHC 32.6 32.0 - 36.0 g/dL   RDW 29.5 18.8 - 41.6 %   Platelets 301 140 - 400 Thousand/uL   MPV 10.1 7.5 - 12.5 fL   Neutro Abs 4,019 1,500 - 7,800 cells/uL   Lymphs Abs 2,780 850 - 3,900 cells/uL   Absolute Monocytes 809 200 - 950 cells/uL   Eosinophils Absolute 31 15 - 500 cells/uL   Basophils Absolute  62 0 - 200 cells/uL   Neutrophils Relative % 52.2 %   Total Lymphocyte 36.1 %   Monocytes Relative 10.5 %   Eosinophils Relative 0.4 %   Basophils Relative 0.8 %  COMPLETE METABOLIC PANEL WITH GFR  Result Value Ref Range   Glucose, Bld 87 65 - 99 mg/dL   BUN 12 7 - 25 mg/dL   Creat 9.60 4.54 - 0.98 mg/dL   eGFR 119 > OR = 60 JY/NWG/9.56O1   BUN/Creatinine Ratio SEE NOTE: 6 - 22 (calc)   Sodium 139 135 - 146 mmol/L   Potassium 4.5 3.5 - 5.3 mmol/L   Chloride 103 98 - 110 mmol/L   CO2 27 20 - 32 mmol/L   Calcium 9.4 8.6 - 10.2 mg/dL   Total Protein 7.3 6.1 - 8.1 g/dL   Albumin 4.7 3.6 - 5.1 g/dL   Globulin 2.6 1.9 - 3.7 g/dL (calc)   AG Ratio 1.8 1.0 - 2.5 (calc)   Total Bilirubin 0.6 0.2 - 1.2 mg/dL   Alkaline phosphatase (APISO) 50 31 - 125 U/L   AST 23 10 - 30 U/L   ALT 28 6 - 29 U/L  Hemoglobin A1c  Result Value Ref Range   Hgb A1c MFr Bld 5.6 <5.7 % of total Hgb   Mean Plasma Glucose 114 mg/dL   eAG (mmol/L) 6.3 mmol/L  VITAMIN D 25 Hydroxy (Vit-D Deficiency, Fractures)  Result Value Ref Range   Vit D, 25-Hydroxy 30 30 - 100 ng/mL  B12 and Folate Panel  Result Value Ref Range   Vitamin B-12 475 200 - 1,100 pg/mL   Folate 19.6 ng/mL      Assessment & Plan:   Problem List Items Addressed This Visit   None    Follow up plan: No follow-ups on file.

## 2023-05-24 NOTE — Telephone Encounter (Signed)
  Chief Complaint: abd pain for 10 days that is getting worse Symptoms: Pain across lower abd all the way across, soft, frequent stools, bloating.  Pain 7/10 before having a BM.   Pain 2/10 otherwise. Frequency: For the last 10 days. Pertinent Negatives: Patient denies vomiting, diarrhea, just soft stools more frequently than her normal.   Disposition: [] ED /[] Urgent Care (no appt availability in office) / [x] Appointment(In office/virtual)/ []  [facility]/ [] Home Care/ [] Refused Recommended Disposition /[] [place] [facility]/ [x]  Follow-up with PCP Additional Notes: No appts available with any of the providers at [facility] until the end of July.   Message sent to see if she can be worked in sooner.   The float provider Erin Mecum, PA-C also booked today.   Pt agreeable to someone calling her back.   Needs sign language interpreter.   Call her number and the call automatically goes through a sign language interpreting service.     She is requesting the use of the VRI screen where the interpreter is visible for her visit.

## 2023-05-25 ENCOUNTER — Ambulatory Visit (INDEPENDENT_AMBULATORY_CARE_PROVIDER_SITE_OTHER): Payer: Medicare Other | Admitting: Nurse Practitioner

## 2023-05-25 ENCOUNTER — Encounter: Payer: Self-pay | Admitting: Nurse Practitioner

## 2023-05-25 ENCOUNTER — Other Ambulatory Visit: Payer: Self-pay

## 2023-05-25 VITALS — BP 124/82 | HR 85 | Temp 98.6°F | Resp 16 | Ht 63.0 in | Wt 184.3 lb

## 2023-05-25 DIAGNOSIS — R1030 Lower abdominal pain, unspecified: Secondary | ICD-10-CM | POA: Diagnosis not present

## 2023-05-25 LAB — CBC WITH DIFFERENTIAL/PLATELET
Absolute Monocytes: 697 cells/uL (ref 200–950)
Basophils Absolute: 48 cells/uL (ref 0–200)
MCH: 29 pg (ref 27.0–33.0)
Platelets: 317 10*3/uL (ref 140–400)

## 2023-05-25 NOTE — Addendum Note (Signed)
Addended by: Della Goo F on: 05/25/2023 12:11 PM   Modules accepted: Orders

## 2023-05-26 ENCOUNTER — Other Ambulatory Visit: Payer: Self-pay | Admitting: Physical Medicine and Rehabilitation

## 2023-05-26 ENCOUNTER — Encounter: Payer: Self-pay | Admitting: Nurse Practitioner

## 2023-05-26 DIAGNOSIS — M542 Cervicalgia: Secondary | ICD-10-CM | POA: Diagnosis not present

## 2023-05-26 DIAGNOSIS — G43109 Migraine with aura, not intractable, without status migrainosus: Secondary | ICD-10-CM | POA: Diagnosis not present

## 2023-05-26 DIAGNOSIS — G43719 Chronic migraine without aura, intractable, without status migrainosus: Secondary | ICD-10-CM | POA: Diagnosis not present

## 2023-05-26 DIAGNOSIS — M791 Myalgia, unspecified site: Secondary | ICD-10-CM | POA: Diagnosis not present

## 2023-05-26 DIAGNOSIS — G518 Other disorders of facial nerve: Secondary | ICD-10-CM | POA: Diagnosis not present

## 2023-05-26 DIAGNOSIS — G43111 Migraine with aura, intractable, with status migrainosus: Secondary | ICD-10-CM | POA: Diagnosis not present

## 2023-05-26 LAB — CBC WITH DIFFERENTIAL/PLATELET
Basophils Relative: 0.7 %
Eosinophils Absolute: 48 cells/uL (ref 15–500)
Eosinophils Relative: 0.7 %
HCT: 42.2 % (ref 35.0–45.0)
Hemoglobin: 13.9 g/dL (ref 11.7–15.5)
Lymphs Abs: 2132 cells/uL (ref 850–3900)
MCHC: 32.9 g/dL (ref 32.0–36.0)
MCV: 88.1 fL (ref 80.0–100.0)
MPV: 9.6 fL (ref 7.5–12.5)
Monocytes Relative: 10.1 %
Neutro Abs: 3974 cells/uL (ref 1500–7800)
Neutrophils Relative %: 57.6 %
RBC: 4.79 10*6/uL (ref 3.80–5.10)
RDW: 12.2 % (ref 11.0–15.0)
Total Lymphocyte: 30.9 %
WBC: 6.9 10*3/uL (ref 3.8–10.8)

## 2023-05-26 LAB — COMPLETE METABOLIC PANEL WITH GFR
AG Ratio: 1.7 (calc) (ref 1.0–2.5)
ALT: 38 U/L — ABNORMAL HIGH (ref 6–29)
AST: 21 U/L (ref 10–30)
Albumin: 4.6 g/dL (ref 3.6–5.1)
Alkaline phosphatase (APISO): 65 U/L (ref 31–125)
BUN: 11 mg/dL (ref 7–25)
CO2: 29 mmol/L (ref 20–32)
Calcium: 9.6 mg/dL (ref 8.6–10.2)
Chloride: 103 mmol/L (ref 98–110)
Creat: 0.73 mg/dL (ref 0.50–0.97)
Globulin: 2.7 g/dL (calc) (ref 1.9–3.7)
Glucose, Bld: 90 mg/dL (ref 65–99)
Potassium: 4.1 mmol/L (ref 3.5–5.3)
Sodium: 140 mmol/L (ref 135–146)
Total Bilirubin: 0.3 mg/dL (ref 0.2–1.2)
Total Protein: 7.3 g/dL (ref 6.1–8.1)
eGFR: 109 mL/min/{1.73_m2} (ref >=60)

## 2023-05-29 ENCOUNTER — Ambulatory Visit: Payer: Medicare Other

## 2023-05-29 ENCOUNTER — Other Ambulatory Visit: Payer: Self-pay | Admitting: Nurse Practitioner

## 2023-05-29 DIAGNOSIS — R1030 Lower abdominal pain, unspecified: Secondary | ICD-10-CM

## 2023-05-29 NOTE — Telephone Encounter (Unsigned)
Copied from CRM [phone]. Topic: General - Other >> May 26, 2023  4:10 PM Franchot Heidelberg wrote: Reason for CRM: Pt called reporting that they cannot afford to pay the copay for the CAT scan on Monday. They are asking if the CAT scan can be reordered with a different code that will allow her to pay less. Something that her insurance will cover more I'm assuming. Please advise if possible

## 2023-06-02 ENCOUNTER — Encounter: Payer: Self-pay | Admitting: Physical Medicine & Rehabilitation

## 2023-06-02 ENCOUNTER — Encounter: Payer: Medicare Other | Attending: Physical Medicine and Rehabilitation | Admitting: Physical Medicine & Rehabilitation

## 2023-06-02 VITALS — BP 110/79 | HR 90 | Ht 63.0 in | Wt 178.0 lb

## 2023-06-02 DIAGNOSIS — G894 Chronic pain syndrome: Secondary | ICD-10-CM | POA: Diagnosis not present

## 2023-06-02 DIAGNOSIS — G43709 Chronic migraine without aura, not intractable, without status migrainosus: Secondary | ICD-10-CM | POA: Insufficient documentation

## 2023-06-02 DIAGNOSIS — M25571 Pain in right ankle and joints of right foot: Secondary | ICD-10-CM | POA: Insufficient documentation

## 2023-06-02 DIAGNOSIS — G4733 Obstructive sleep apnea (adult) (pediatric): Secondary | ICD-10-CM | POA: Insufficient documentation

## 2023-06-02 DIAGNOSIS — M797 Fibromyalgia: Secondary | ICD-10-CM | POA: Insufficient documentation

## 2023-06-02 NOTE — Patient Instructions (Signed)
Acupuncture Acupuncture is a type of treatment that involves stimulating specific points on your body by inserting thin needles through your skin. Acupuncture is often used to treat pain, but it may also be used to help relieve other types of symptoms. Your health care provider may recommend acupuncture to help treat various conditions, such as: Migraine and tension headaches. Nausea and vomiting after a surgery or cancer treatment. Sudden or severe (acute) pain, or long-term (chronic) pain. Addiction. Acupuncture is based on traditional Chinese medicine, which recognizes more than 2,000 points on the body that connect energy pathways (meridians) through the body. The goal in stimulating these points is to balance the physical, emotional, and mental energy in your body. Acupuncture is done by a health care provider who has specialized training (licensed acupuncture practitioner). Treatment often requires several acupuncture sessions. You may have acupuncture along with other medical treatments. Tell a health care provider about: Any allergies you have. All medicines you are taking, including vitamins, herbs, eye drops, creams, and over-the-counter medicines. Any blood disorders you have. Any surgeries you have had. Any medical conditions you have. Whether you are pregnant or may be pregnant. What are the risks? Generally, this is a safe treatment. However, problems may occur, including: Skin infection. Damage to organs or structures that are under the skin if a needle is placed too deeply. This is rare. What happens before the treatment? Your acupuncture practitioner will ask about your medical history and your symptoms. You may have a physical exam. What happens during the treatment? The exact procedure will depend on your condition and how your acupuncture provider treats it. In general: Your skin will be cleaned with a germ-killing (antiseptic) solution. Your acupuncture practitioner will  open a new set of germ-free (sterile) needles. The needles will be gently inserted into your skin. They will be left in place for a certain amount of time. You may feel a tingling or burning sensation for a very short period of time. Your acupuncture practitioner may: Apply electrical energy to the needles. Adjust the needles in certain ways. After your procedure, the acupuncture practitioner will remove the needles, throw them away, and clean your skin. The procedure may vary among health care providers. What can I expect after the treatment? People react differently to acupuncture. Make sure you ask your acupuncture provider what to expect after your treatment. It is common to have: Minor bruising. Mild pain. A small amount of bleeding. Follow these instructions at home: Follow any instructions given by your provider after the treatment. Keep all follow-up visits. This is important. Where to find more information National [employer]ative [employer]: www.nccih.nih.gov Contact a health care provider if: You have questions about your reaction to the treatment. You have soreness. You have skin irritation or redness. You have a fever. Summary Acupuncture is a type of treatment that involves stimulating specific points on your body by inserting thin needles through your skin. This treatment is often used to treat pain, but it may also be used to help relieve other types of symptoms. The exact procedure will depend on your condition and how your acupuncture provider treats it. This information is not intended to replace advice given to you by your health care provider. Make sure you discuss any questions you have with your health care provider. Document Revised: 07/21/2021 Document Reviewed: 07/21/2021 Elsevier Patient Education  2024 [employer]  

## 2023-06-02 NOTE — Progress Notes (Signed)
Acupuncture treatment #1 Indication Fibromyalgia syndrome  40 mm sterile acupuncture needles placed at bilateral i4, SP 36 with electrical stimulation between LI 4 and SP 36 bilaterally 2 Hz x 25 minutes.  Patient tolerated procedure well

## 2023-06-06 ENCOUNTER — Ambulatory Visit
Admission: RE | Admit: 2023-06-06 | Discharge: 2023-06-06 | Disposition: A | Discharge status: Home / Self Care | Payer: Medicare Other | Attending: Physician Assistant | Admitting: Physician Assistant

## 2023-06-06 ENCOUNTER — Ambulatory Visit
Admission: RE | Admit: 2023-06-06 | Discharge: 2023-06-06 | Disposition: A | Discharge status: Home / Self Care | Payer: Medicare Other | Source: Ambulatory Visit | Attending: Physician Assistant | Admitting: Physician Assistant

## 2023-06-06 ENCOUNTER — Ambulatory Visit (INDEPENDENT_AMBULATORY_CARE_PROVIDER_SITE_OTHER): Payer: Medicare Other | Admitting: Physician Assistant

## 2023-06-06 ENCOUNTER — Encounter: Payer: Self-pay | Admitting: Physician Assistant

## 2023-06-06 VITALS — BP 111/76 | HR 71 | Temp 98.6°F | Ht 63.0 in | Wt 184.2 lb

## 2023-06-06 DIAGNOSIS — K219 Gastro-esophageal reflux disease without esophagitis: Secondary | ICD-10-CM | POA: Diagnosis not present

## 2023-06-06 DIAGNOSIS — R1084 Generalized abdominal pain: Secondary | ICD-10-CM | POA: Diagnosis not present

## 2023-06-06 DIAGNOSIS — I878 Other specified disorders of veins: Secondary | ICD-10-CM | POA: Diagnosis not present

## 2023-06-06 DIAGNOSIS — R1030 Lower abdominal pain, unspecified: Secondary | ICD-10-CM | POA: Diagnosis not present

## 2023-06-06 DIAGNOSIS — K5900 Constipation, unspecified: Secondary | ICD-10-CM | POA: Diagnosis not present

## 2023-06-06 MED ORDER — OMEPRAZOLE 40 MG PO CPDR: 40.0000 mg | DELAYED_RELEASE_CAPSULE | Freq: Every day | ORAL | 3 refills | Status: DC

## 2023-06-06 MED ORDER — DICYCLOMINE HCL 10 MG PO CAPS: 10.0000 mg | ORAL_CAPSULE | Freq: Three times a day (TID) | ORAL | 3 refills | Status: DC | PRN

## 2023-06-06 NOTE — Patient Instructions (Addendum)
You may go anytime to [facility] entrance-Radiology department to have your Xray done. Appointment for this is not necessary.  Ultrasound scheduled  06/20/23 @ 8:15 am. Nothing to eat/drink after midnight.

## 2023-06-06 NOTE — Progress Notes (Signed)
Celso Amy, PA-C [address]  [address]  [place], [place] [address]  Main: [phone]  Fax: [phone]   Gastroenterology Consultation  Referring Provider:     Alba Cory, MD Primary Care Physician:  Alba Cory, MD Primary Gastroenterologist:  Celso Amy, PA-C  Reason for Consultation:     Lower abdominal pain        HPI:   Yesenia Wood is a 37 y.o. y/o female referred for consultation & management  by Alba Cory, MD.    We are using sign language interpreter today.  Patient saw her PCP, Yesenia Goo, FNP on 05/25/2023 to evaluate bilateral lower abdominal pain for 2 weeks.  She continues to have ongoing bilateral lower abdominal pain for 1 month.  Increased bowel sounds.  She has NOT had any constipation, diarrhea, nausea, vomiting, melena, hematochezia, fever, or weight loss.  She is currently having 2 or 3 pasty BMs daily.  She admits to abdominal bloating and occasional heartburn.  Feeling of early satiety.  Takes Pepcid 40 mg every morning which is not controlling her reflux.  Admits to increased stress in the past few months with 2 family deaths.  Labs 05/25/2023 showed normal CBC and CMP.  ALT slightly elevated at 38, otherwise normal LFTs.  Hemoglobin 13.9.  Abdominal pelvic CT with contrast has been ordered, not yet performed.  Insurance would not approve CT scan.  She has not had abdominal x-ray or ultrasound.  No recent GYN evaluation.  She has had tubal ligation.  No previous pregnancies.  She reports seeing a GI 10 years ago in [place] for heartburn.  Also reports having a normal colonoscopy 10 years ago in [place], results unavailable.  She reports having laparoscopic abdominal surgery and previous uterine ablation many years ago.  She has had tubal ligation.  No other abdominal or pelvic surgeries.  Past Medical History:  Diagnosis Date   ADHD (attention deficit hyperactivity disorder)    B12 deficiency    Bell's palsy    right  sided   Chest pain    a. 05/2015 ETT: elev HR/BP, but no acute ST/T changes; b. 09/2019 Cor CTA: Nl cors, Ca2+ = 0.   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   Fatigue    Fatty liver    Fibromyalgia    GERD (gastroesophageal reflux disease)    Headache    High triglycerides    Hyperglycemia    Hypertension    Hyperthyroidism    Moderate asthma    Obesity    Paresthesia    Premature birth     Past Surgical History:  Procedure Laterality Date   BARTHOLIN GLAND CYST REMOVAL     COCHLEAR IMPLANT     COCHLEAR IMPLANT REMOVAL     DILATATION AND CURETTAGE/HYSTEROSCOPY WITH MINERVA N/A 03/05/2021   Procedure: DILATATION AND CURETTAGE/HYSTEROSCOPY WITH MINERVA;  Surgeon: Hildred Laser, MD;  Location: [facility];  Service: Gynecology;  Laterality: N/A;   LAPAROSCOPIC TUBAL LIGATION Bilateral 03/26/2018   Procedure: LAPAROSCOPIC TUBAL LIGATION;  Surgeon: Hildred Laser, MD;  Location: [facility];  Service: Gynecology;  Laterality: Bilateral;  with peritoneal biopsies   LAPAROSCOPY     MYRINGOTOMY WITH TUBE PLACEMENT     TUBAL LIGATION  03/26/2018    Prior to Admission medications   Medication Sig Start Date End Date Taking? Authorizing Provider  amitriptyline (ELAVIL) 10 MG  tablet TAKE 1 TABLET BY MOUTH EVERYDAY AT BEDTIME Patient not taking: Reported on 06/02/2023 05/26/23   Elijah Birk C, DO  BOTOX 100 units SOLR injection Inject 100 Units into the muscle every 3 (three) months. 01/09/19   [provider]  Cholecalciferol (VITAMIN D) 50 MCG (2000 UT) CAPS Take 1 capsule by mouth daily.    [provider]  Cyanocobalamin (VITAMIN B-12) 500 MCG SUBL Place 1 tablet (500 mcg total) under the tongue once a week. 04/08/20   Alba Cory, MD  doxylamine, Sleep, (UNISOM) 25 MG tablet Take 25 mg by mouth at bedtime as needed.    [provider]  Elagolix Sodium (ORILISSA) 200 MG  TABS Take 1 tablet (200 mg total) by mouth in the morning and at bedtime. 03/31/23   Hildred Laser, MD  famotidine (PEPCID) 40 MG tablet Take 1 tablet (40 mg total) by mouth daily. For reflux and nausea  in am's 02/10/23   Alba Cory, MD  ferrous sulfate 325 (65 FE) MG tablet Take 325 mg by mouth daily with breakfast.    [provider]  FIBER ADULT GUMMIES PO Take 15 mg by mouth daily.    [provider]  folic acid (FOLVITE) 800 MCG tablet Take 800 mcg by mouth daily. 800 mcg    [provider]  loratadine (CLARITIN) 10 MG tablet TAKE 1 TABLET BY MOUTH EVERY DAY 09/12/22   Alba Cory, MD  metoprolol succinate (TOPROL-XL) 50 MG 24 hr tablet Take 1 tablet (50 mg total) by mouth daily. 01/26/23   Iran Ouch, MD  NON FORMULARY CBD soft gel as needed.    [provider]  polyethylene glycol powder (GLYCOLAX/MIRALAX) 17 GM/SCOOP powder Take 17 g by mouth daily. 08/12/22   Alba Cory, MD  rosuvastatin (CRESTOR) 20 MG tablet Take 1 tablet (20 mg total) by mouth daily. 02/10/23   Alba Cory, MD    Family History  Adopted: Yes  Family history unknown: Yes     Social History   Tobacco Use   Smoking status: Former    Packs/day: 1.00    Years: 2.00    Additional pack years: 0.00    Total pack years: 2.00    Types: Cigarettes    Start date: 11/28/2004    Quit date: 11/28/2006    Years since quitting: 16.5   Smokeless tobacco: Never  Vaping Use   Vaping Use: Never used  Substance Use Topics   Alcohol use: Yes    Comment: 1 every 3 months   Drug use: No    Allergies as of 06/06/2023 - Review Complete 06/06/2023  Allergen Reaction Noted   Budesonide-formoterol fumarate Anaphylaxis and Other (See Comments) 08/19/2013   Astelin [azelastine]  10/12/2017   Gabapentin Nausea And Vomiting 11/12/2014   Nortriptyline Other (See Comments) 06/23/2015   Fluogen [influenza virus vaccine]  08/19/2022   Lamictal [lamotrigine]  09/15/2020    Milnacipran Other (See Comments) 04/24/2014   Tegaderm ag mesh [silver] Itching 09/25/2019   Diclofenac Other (See Comments) 08/19/2013   Duloxetine hcl Other (See Comments) 08/19/2013   Montelukast sodium Other (See Comments) 08/19/2013   Mupirocin Other (See Comments) 10/06/2014   Naproxen Other (See Comments) and Rash 11/06/2007   Naproxen sodium Rash 04/23/2015   Other Rash 02/06/2007   Oxycodone hcl Rash 04/04/2018   Sulfa antibiotics Hives, Rash, and Itching 02/06/2007    Review of Systems:    All systems reviewed and negative except where noted in HPI.  Physical Exam:  BP 111/76   Pulse 71   Temp 98.6 F (37 C)   Ht 5\' 3"  (1.6 m)   Wt 184 lb 3.2 oz (83.6 kg)   LMP  (LMP Unknown) Comment: irregular due to medication  BMI 32.63 kg/m  No LMP recorded (lmp unknown). (Menstrual status: Oral contraceptives). Psych:  Alert and cooperative. Normal mood and affect. General:   Alert,  Well-developed, well-nourished, pleasant and cooperative in NAD Head:  Normocephalic and atraumatic. Eyes:  Sclera clear, no icterus.   Conjunctiva pink. Neck:  Supple; no masses or thyromegaly. Lungs:  Respirations even and unlabored.  Clear throughout to auscultation.   No wheezes, crackles, or rhonchi. No acute distress. Heart:  Regular rate and rhythm; no murmurs, clicks, rubs, or gallops. Abdomen:  Normal bowel sounds.  No bruits.  Soft, and non-distended without masses, hepatosplenomegaly or hernias noted.  Mild Epigastric and Diffuse bilateral lower abdominal Tenderness.  No guarding or rebound tenderness.    Neurologic:  Alert and oriented x3;  grossly normal neurologically. Psych:  Alert and cooperative. Normal mood and affect.  Imaging Studies: No results found.  Assessment and Plan:   JENIAH KISHI is a 37 y.o. y/o female has been referred for lower abdominal pain and acid reflux.  Symptoms are most consistent with irritable bowel syndrome and GERD.  I am ordering complete abdominal  ultrasound and abdominal x-ray for further evaluation.  Also giving treatment with follow-up.  If ultrasound and x-ray are unrevealing and abdominal pain persist, then we will reschedule Abd / pelvic CT scan.  Also advised patient to follow-up with her GYN for possible pelvic ultrasound.  1.  Generalized abdominal pain, worse in the bilateral lower abdomen; suspect IBS  Try dicyclomine 10 mg 1 tablet 3 times daily as needed for abdominal cramping.  Low FODMAP diet given.  Abdominal x-ray, evaluate stool burden, evaluate for constipation.  Complete abdominal ultrasound.  2.  GERD, not controlled on Pepcid.  Continue Pepcid famotidine 40 mg every morning.  Add Nexium 40 mg once daily before bedtime.  If symptoms persist, EGD is next step.  3.  Pelvic pain  I advised patient to follow-up with her GYN for pelvic exam and possible pelvic ultrasound.  Follow up in 4 to 6 weeks.  Celso Amy, PA-C

## 2023-06-07 ENCOUNTER — Telehealth: Payer: Self-pay

## 2023-06-07 NOTE — Progress Notes (Signed)
Abdominal x-ray shows small amount of stool throughout the colon.  No evidence of bowel obstruction.  Moderate gas in the lower colon.  I recommend continue plan to try dicyclomine 1 tablet 3 times daily for abdominal cramping.  Low FODMAP diet.  If she has a lot of gas, then she can try OTC Gas-X as needed.  No evidence of significant constipation.  Continue with plan for abdominal ultrasound as scheduled.

## 2023-06-07 NOTE — Telephone Encounter (Signed)
Left message to return call to office.   Abdominal x-ray shows small amount of stool throughout the colon.  No evidence of bowel obstruction.  Moderate gas in the lower colon.  I recommend continue plan to try dicyclomine 1 tablet 3 times daily for abdominal cramping.  Low FODMAP diet.  If she has a lot of gas, then she can try OTC Gas-X as needed.  No evidence of significant constipation.  Continue with plan for abdominal ultrasound as scheduled.

## 2023-06-08 NOTE — Telephone Encounter (Signed)
Called and left a message for call back  

## 2023-06-09 ENCOUNTER — Encounter (HOSPITAL_BASED_OUTPATIENT_CLINIC_OR_DEPARTMENT_OTHER): Payer: Medicare Other | Admitting: Physical Medicine & Rehabilitation

## 2023-06-09 ENCOUNTER — Encounter: Payer: Self-pay | Admitting: Physical Medicine & Rehabilitation

## 2023-06-09 VITALS — BP 111/78 | HR 77 | Ht 63.0 in | Wt 186.0 lb

## 2023-06-09 DIAGNOSIS — G4733 Obstructive sleep apnea (adult) (pediatric): Secondary | ICD-10-CM | POA: Diagnosis not present

## 2023-06-09 DIAGNOSIS — M797 Fibromyalgia: Secondary | ICD-10-CM | POA: Diagnosis not present

## 2023-06-09 DIAGNOSIS — G43709 Chronic migraine without aura, not intractable, without status migrainosus: Secondary | ICD-10-CM | POA: Diagnosis not present

## 2023-06-09 DIAGNOSIS — M25571 Pain in right ankle and joints of right foot: Secondary | ICD-10-CM | POA: Diagnosis not present

## 2023-06-09 DIAGNOSIS — G894 Chronic pain syndrome: Secondary | ICD-10-CM | POA: Diagnosis not present

## 2023-06-09 NOTE — Patient Instructions (Signed)
Acupuncture Acupuncture is a type of treatment that involves stimulating specific points on your body by inserting thin needles through your skin. Acupuncture is often used to treat pain, but it may also be used to help relieve other types of symptoms. Your health care provider may recommend acupuncture to help treat various conditions, such as: Migraine and tension headaches. Nausea and vomiting after a surgery or cancer treatment. Sudden or severe (acute) pain, or long-term (chronic) pain. Addiction. Acupuncture is based on traditional Chinese medicine, which recognizes more than 2,000 points on the body that connect energy pathways (meridians) through the body. The goal in stimulating these points is to balance the physical, emotional, and mental energy in your body. Acupuncture is done by a health care provider who has specialized training (licensed acupuncture practitioner). Treatment often requires several acupuncture sessions. You may have acupuncture along with other medical treatments. Tell a health care provider about: Any allergies you have. All medicines you are taking, including vitamins, herbs, eye drops, creams, and over-the-counter medicines. Any blood disorders you have. Any surgeries you have had. Any medical conditions you have. Whether you are pregnant or may be pregnant. What are the risks? Generally, this is a safe treatment. However, problems may occur, including: Skin infection. Damage to organs or structures that are under the skin if a needle is placed too deeply. This is rare. What happens before the treatment? Your acupuncture practitioner will ask about your medical history and your symptoms. You may have a physical exam. What happens during the treatment? The exact procedure will depend on your condition and how your acupuncture provider treats it. In general: Your skin will be cleaned with a germ-killing (antiseptic) solution. Your acupuncture practitioner will  open a new set of germ-free (sterile) needles. The needles will be gently inserted into your skin. They will be left in place for a certain amount of time. You may feel a tingling or burning sensation for a very short period of time. Your acupuncture practitioner may: Apply electrical energy to the needles. Adjust the needles in certain ways. After your procedure, the acupuncture practitioner will remove the needles, throw them away, and clean your skin. The procedure may vary among health care providers. What can I expect after the treatment? People react differently to acupuncture. Make sure you ask your acupuncture provider what to expect after your treatment. It is common to have: Minor bruising. Mild pain. A small amount of bleeding. Follow these instructions at home: Follow any instructions given by your provider after the treatment. Keep all follow-up visits. This is important. Where to find more information National [employer]ative [employer]: www.nccih.nih.gov Contact a health care provider if: You have questions about your reaction to the treatment. You have soreness. You have skin irritation or redness. You have a fever. Summary Acupuncture is a type of treatment that involves stimulating specific points on your body by inserting thin needles through your skin. This treatment is often used to treat pain, but it may also be used to help relieve other types of symptoms. The exact procedure will depend on your condition and how your acupuncture provider treats it. This information is not intended to replace advice given to you by your health care provider. Make sure you discuss any questions you have with your health care provider. Document Revised: 07/21/2021 Document Reviewed: 07/21/2021 Elsevier Patient Education  2024 [employer]  

## 2023-06-09 NOTE — Progress Notes (Signed)
Acupuncture treatment #2 Indication fibromyalgia and chronic headaches Patient had no adverse effects from the first acupuncture treatment performed 06/02/2023. Sterile one-time use needles were utilized 0.25 mm x 30 mm Needles placed at bilateral LI4 as well as bilateral LR 3 Electrical stimulation 2 Hz x 25 minutes between LI4 and LR 3 bilaterally GV22 GV 24.5 electrical stimulation 2 Hz x 25 minutes  Patient tolerated procedure well  Next procedure scheduled in 1 week

## 2023-06-10 ENCOUNTER — Encounter: Payer: Self-pay | Admitting: Obstetrics and Gynecology

## 2023-06-10 ENCOUNTER — Encounter: Payer: Self-pay | Admitting: Physical Medicine & Rehabilitation

## 2023-06-12 ENCOUNTER — Ambulatory Visit: Payer: Medicare Other | Admitting: Physical Medicine and Rehabilitation

## 2023-06-12 MED ORDER — NORETHINDRONE ACETATE 5 MG PO TABS: 5.0000 mg | ORAL_TABLET | Freq: Every day | ORAL | 3 refills | Status: DC

## 2023-06-13 NOTE — Progress Notes (Signed)
Subjective:    Patient ID: Yesenia Wood, female    DOB: 1986-02-14, 37 y.o.   MRN: [id number]  HPI  Yesenia Wood is a 37 y.o. year old female  who  has a past medical history of ADHD (attention deficit hyperactivity disorder), B12 deficiency, Bell's palsy, Chest pain, Chronic migraine, Deaf, Depression, Dissocial personality disorder (HCC), Fatigue, Fatty liver, Fibromyalgia, GERD (gastroesophageal reflux disease), Headache, High triglycerides, Hyperglycemia, Hypertension, Hyperthyroidism, Moderate asthma, Obesity, Paresthesia, and Premature birth.   They are presenting to PM&R clinic for follow up related to fibromyalgia/chronic pain .  Plan from last visit: Fibromyalgia Insomnia  Start Elavil 25 mg at night for pain and insomnia.  You can take this with Benadryl for 1 week to ensure no reaction, then would stop Benadryl and see if you can take the medication without it.  If you have any issues or concerns, please let the clinic know.   After 2 weeks, message me through MyChart and let me know if the medication is working for you or if any side effects.   Continue physical therapy, and is obtaining swimsuits you can start aqua therapy.   Follow-up with me in 6 to 8 weeks   Sprain of other ligament of left ankle, sequela Obtain Lidoderm or Aspercreme patches over-the-counter, or any patch that integrates lidocaine as the primary ingredient.  Apply these to the outside of your left ankle under clothing in the morning to help with pain.  You can leave the patches on for up to 12 hours.   Obtain an Ace wrap over-the-counter, and have your physical therapist on Thursday show you how to wrap the ankle for additional support and to help with edema.   At follow-up, if you are continuing to have issues, we may refer you to orthopedics or get additional imaging   Interval Hx:  - Therapies: No longer in therapies; stopped that due to it worsening her pain overall.   - Follow ups:   Accupuncture with Dr. Doroteo Bradford 2x most recently 7/12 - has been "ok", states it doesn't last long but is helping somewhat; lasts 2-3 days only. This Firday will be her third session.  Saw Dr. Gerre Pebbles for GI issues; has CT scheduled next months.  She was also referred to GYN for pelvic pain. She had an ultrasound and is waiting for results currently.   - Falls: none  - DME: none  - Medications: Stopped elavil due to GI side effects; states it worked "some", but not much. Does think her GI side effects have improved since coming off.   - Other concerns: Her right heel pain is ongoing. Really bad if she is walking a lot, especially at the store. She is not bothered by biking. She has been wrapping it and using lido patch and ACE wrapping as instructed, this does help her "but only if I really relax".    Pain Inventory Average Pain 3 Pain Right Now 3 My pain is sharp, dull, and aching  In the last 24 hours, has pain interfered with the following? General activity 7 Relation with others 2 Enjoyment of life 7 What TIME of day is your pain at its worst? morning  and daytime Sleep (in general) Fair  Pain is worse with: walking, bending, standing, and some activites Pain improves with:  nothing Relief from Meds:  a little  Family History  Adopted: Yes  Family history unknown: Yes   Social History   Socioeconomic History   Marital  status: Single    Spouse name: Not on file   Number of children: 0   Years of education: Not on file   Highest education level: Some college, no degree  Occupational History   Occupation: disability for hearing loss  Tobacco Use   Smoking status: Former    Current packs/day: 0.00    Average packs/day: 1 pack/day for 2.0 years (2.0 ttl pk-yrs)    Types: Cigarettes    Start date: 11/28/2004    Quit date: 11/28/2006    Years since quitting: 16.5   Smokeless tobacco: Never  Vaping Use   Vaping status: Never Used  Substance and Sexual Activity   Alcohol  use: Yes    Comment: 1 every 3 months   Drug use: No   Sexual activity: Not Currently    Birth control/protection: Pill    Comment: Seasonale  Other Topics Concern   Not on file  Social History Narrative   Lives with her adopted  parents   Hearing loss since birth    Social Determinants of Health   Financial Resource Strain: Low Risk  (03/30/2023)   Overall Financial Resource Strain (CARDIA)    Difficulty of Paying Living Expenses: Not hard at all  Food Insecurity: No Food Insecurity (03/30/2023)   Hunger Vital Sign    Worried About Running Out of Food in the Last Year: Never true    Ran Out of Food in the Last Year: Never true  Transportation Needs: No Transportation Needs (03/30/2023)   PRAPARE - Administrator, Civil Service (Medical): No    Lack of Transportation (Non-Medical): No  Physical Activity: Sufficiently Active (03/30/2023)   Exercise Vital Sign    Days of Exercise per Week: 7 days    Minutes of Exercise per Session: 30 min  Stress: No Stress Concern Present (03/30/2023)   [employer] of Occupational Health - Occupational Stress Questionnaire    Feeling of Stress : Only a little  Social Connections: Socially Isolated (03/30/2023)   Social Connection and Isolation Panel [NHANES]    Frequency of Communication with Friends and Family: Once a week    Frequency of Social Gatherings with Friends and Family: Once a week    Attends Religious Services: Never    Database administrator or Organizations: Yes    Attends Engineer, structural: Never    Marital Status: Never married   Past Surgical History:  Procedure Laterality Date   BARTHOLIN GLAND CYST REMOVAL     COCHLEAR IMPLANT     COCHLEAR IMPLANT REMOVAL     DILATATION AND CURETTAGE/HYSTEROSCOPY WITH MINERVA N/A 03/05/2021   Procedure: DILATATION AND CURETTAGE/HYSTEROSCOPY WITH MINERVA;  Surgeon: Hildred Laser, MD;  Location: [facility];  Service: Gynecology;  Laterality: N/A;   LAPAROSCOPIC TUBAL  LIGATION Bilateral 03/26/2018   Procedure: LAPAROSCOPIC TUBAL LIGATION;  Surgeon: Hildred Laser, MD;  Location: [facility];  Service: Gynecology;  Laterality: Bilateral;  with peritoneal biopsies   LAPAROSCOPY     MYRINGOTOMY WITH TUBE PLACEMENT     TUBAL LIGATION  03/26/2018   Past Surgical History:  Procedure Laterality Date   BARTHOLIN GLAND CYST REMOVAL     COCHLEAR IMPLANT     COCHLEAR IMPLANT REMOVAL     DILATATION AND CURETTAGE/HYSTEROSCOPY WITH MINERVA N/A 03/05/2021   Procedure: DILATATION AND CURETTAGE/HYSTEROSCOPY WITH MINERVA;  Surgeon: Hildred Laser, MD;  Location: [facility];  Service: Gynecology;  Laterality: N/A;   LAPAROSCOPIC TUBAL LIGATION Bilateral 03/26/2018   Procedure: LAPAROSCOPIC TUBAL  LIGATION;  Surgeon: Hildred Laser, MD;  Location: [facility];  Service: Gynecology;  Laterality: Bilateral;  with peritoneal biopsies   LAPAROSCOPY     MYRINGOTOMY WITH TUBE PLACEMENT     TUBAL LIGATION  03/26/2018   Past Medical History:  Diagnosis Date   ADHD (attention deficit hyperactivity disorder)    B12 deficiency    Bell's palsy    right sided   Chest pain    a. 05/2015 ETT: elev HR/BP, but no acute ST/T changes; b. 09/2019 Cor CTA: Nl cors, Ca2+ = 0.   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   Fatigue    Fatty liver    Fibromyalgia    GERD (gastroesophageal reflux disease)    Headache    High triglycerides    Hyperglycemia    Hypertension    Hyperthyroidism    Moderate asthma    Obesity    Paresthesia    Premature birth    LMP  (LMP Unknown) Comment: irregular due to medication  Opioid Risk Score:   Fall Risk Score:  `1  Depression screen [facility] 2/9     06/09/2023   10:24 AM 06/09/2023   10:21 AM 06/02/2023    1:04 PM 05/25/2023   11:34 AM 05/01/2023   11:55 AM 04/07/2023   10:20 AM 03/30/2023   10:01 AM  Depression screen PHQ 2/9  Decreased Interest 0 0 1 0 1 2 2   Down,  Depressed, Hopeless 0 0 1  1 1 3   PHQ - 2 Score 0 0 2 0 2 3 5   Altered sleeping      3 3  Tired, decreased energy      2 2  Change in appetite      1 1  Feeling bad or failure about yourself       2 0  Trouble concentrating      1 1  Moving slowly or fidgety/restless      0 0  Suicidal thoughts      0 0  PHQ-9 Score      12 12  Difficult doing work/chores      Somewhat difficult     Review of Systems  Musculoskeletal:  Positive for back pain and neck pain.       Pain in both feet, pian in right hand & both hips  Neurological:  Positive for headaches.  All other systems reviewed and are negative.     Objective:   Physical Exam   PE: Constitution: Appropriate appearance for age. No apparent distress  +Obese Resp: No respiratory distress. No accessory muscle usage. on RA Cardio: Well perfused appearance. No peripheral edema. Abdomen: Nondistended. Nontender.   Psych: Appropriate mood and affect. Neuro: AAOx4. No apparent cognitive deficits +Deafness   Neurologic Exam:   Motor exam: strength 5/5 throughout bilateral upper extremities and bilateral lower extremities Coordination: Fine motor coordination was normal.   Gait: normal     Back Exam:   Inspection: Pelvis was  even.  Lumbar lordotic curvature was  wnl .  There was  no evidence of scoliosis.  Palpation: Palpatory exam revealed + TTP b/l paraspinals, trapezius, shoulders ,PSIS, SI joints, and greater trochanters . There was  no evidence of spasm.     R foot exam: + TTP posterior medial ankle along talus-calcaneus joint line. AROM WNL. No apparent edema, mild effusion. Negative talar tilt,  negative ankle jerk.        Assessment & Plan:   Yesenia Wood is a 37 y.o. year old female  who  has a past medical history of ADHD (attention deficit hyperactivity disorder), B12 deficiency, Bell's palsy, Chest pain, Chronic migraine, Deaf, Depression, Dissocial personality disorder (HCC), Fatigue, Fatty liver, Fibromyalgia,  GERD (gastroesophageal reflux disease), Headache, High triglycerides, Hyperglycemia, Hypertension, Hyperthyroidism, Moderate asthma, Obesity, Paresthesia, and Premature birth.   They are presenting to PM&R clinic for follow up related to fibromyalgia/chronic pain .  Chronic pain syndrome Fibromyalgia Chronic migraine without aura without status migrainosus, not intractable Thus far has failed Elavil due to GI side effects, as well as duloxetine, gabapentin, lyrica, and minalcipran, along with several mood stabilizers/anti-epileptics. Can tolerate Naprosyn with benadryll at nighttime, started by her PCP.   Today I refilled your naprosyn; continue to take once daily as needed with benadryll. I think the GI side effects are not worth starting a once daily NSAID at this time.  Talk to your psychiatrist about the possibility of transcranial magnetic stimulation for both depression control and chronic pain. Let me know their thoughts.   Follow up with me for trigger point injections.  Given no further medication recommendations, follow up with me for general visit in 6 months.   Acute right ankle pain Improving with RICE, use of brace. Continue conservative management.   OSA (obstructive sleep apnea) -     Ambulatory referral to Pulmonology  I am referring you to sleep medicine given your history of OSA and poor sleep to help see if you need a CPAP vs. Other intervention to improve your sleep quality; this may also help with pain control.   Other orders -     Naproxen; Take 1 tablet (500 mg total) by mouth daily as needed (take with benadryll).  Dispense: 90 tablet; Refill: 3

## 2023-06-14 ENCOUNTER — Encounter (HOSPITAL_BASED_OUTPATIENT_CLINIC_OR_DEPARTMENT_OTHER): Payer: Medicare Other | Admitting: Physical Medicine and Rehabilitation

## 2023-06-14 ENCOUNTER — Encounter: Payer: Self-pay | Admitting: Physical Medicine and Rehabilitation

## 2023-06-14 VITALS — BP 116/81 | HR 80 | Ht 63.0 in | Wt 184.0 lb

## 2023-06-14 DIAGNOSIS — M25571 Pain in right ankle and joints of right foot: Secondary | ICD-10-CM

## 2023-06-14 DIAGNOSIS — G4733 Obstructive sleep apnea (adult) (pediatric): Secondary | ICD-10-CM | POA: Diagnosis not present

## 2023-06-14 DIAGNOSIS — G894 Chronic pain syndrome: Secondary | ICD-10-CM

## 2023-06-14 DIAGNOSIS — M797 Fibromyalgia: Secondary | ICD-10-CM | POA: Diagnosis not present

## 2023-06-14 DIAGNOSIS — G43709 Chronic migraine without aura, not intractable, without status migrainosus: Secondary | ICD-10-CM | POA: Diagnosis not present

## 2023-06-14 MED ORDER — NAPROXEN 500 MG PO TABS: 500.0000 mg | ORAL_TABLET | Freq: Every day | ORAL | 3 refills | Status: DC | PRN

## 2023-06-14 NOTE — Patient Instructions (Addendum)
Today I refilled your naprosyn; continue to take once daily as needed with benadryll. I think the GI side effects are not worth starting a once daily NSAID at this time.  I am referring you to sleep medicine given your history of OSA and poor sleep to help see if you need a CPAP vs. Other intervention to improve your sleep quality; this may also help with pain control.  Talk to your psychiatrist about the possibility of transcranial magnetic stimulation for both depression control and chronic pain. Let me know their thoughts.   Follow up with me for trigger point injections.  Given no further medication recommendations, follow up with me for general visit in 6 months.

## 2023-06-16 ENCOUNTER — Encounter: Payer: Self-pay | Admitting: Physical Medicine & Rehabilitation

## 2023-06-16 ENCOUNTER — Encounter (HOSPITAL_BASED_OUTPATIENT_CLINIC_OR_DEPARTMENT_OTHER): Payer: Medicare Other | Admitting: Physical Medicine & Rehabilitation

## 2023-06-16 VITALS — BP 119/83 | HR 84 | Ht 63.0 in | Wt 184.0 lb

## 2023-06-16 DIAGNOSIS — G894 Chronic pain syndrome: Secondary | ICD-10-CM | POA: Diagnosis not present

## 2023-06-16 DIAGNOSIS — M25571 Pain in right ankle and joints of right foot: Secondary | ICD-10-CM | POA: Diagnosis not present

## 2023-06-16 DIAGNOSIS — G43709 Chronic migraine without aura, not intractable, without status migrainosus: Secondary | ICD-10-CM | POA: Diagnosis not present

## 2023-06-16 DIAGNOSIS — G4733 Obstructive sleep apnea (adult) (pediatric): Secondary | ICD-10-CM | POA: Diagnosis not present

## 2023-06-16 DIAGNOSIS — M797 Fibromyalgia: Secondary | ICD-10-CM | POA: Diagnosis not present

## 2023-06-16 NOTE — Progress Notes (Addendum)
Dictation on: 06/16/2023 12:00 PM by: Erick Colace [[id number]]   Acupuncture treatment #3 Indication fibromyalgia as well as chronic headaches. Last visit patient felt like she has some visual disturbance after the procedure.  She is worried about the needle at the forehead region as well as the vertex of the scalp.  I examined both those areas no evidence of infection infection or skin changes. Extraocular movements are intact She has an ophthalmology eval pending. Treatment today consisted of 30 mm sterile stainless steel needles placed at bilateral large intestine 4 as well as bilateral liver 3 electrical stimulation at 2 Hz x 25 minutes between the needles of right large intestine 4 right liver 3 as well as on the left side.  Patient tolerated procedure well postprocedure instructions given

## 2023-06-20 ENCOUNTER — Ambulatory Visit: Payer: Medicare Other

## 2023-06-21 DIAGNOSIS — M542 Cervicalgia: Secondary | ICD-10-CM | POA: Diagnosis not present

## 2023-06-21 DIAGNOSIS — M791 Myalgia, unspecified site: Secondary | ICD-10-CM | POA: Diagnosis not present

## 2023-06-21 DIAGNOSIS — G518 Other disorders of facial nerve: Secondary | ICD-10-CM | POA: Diagnosis not present

## 2023-06-21 DIAGNOSIS — G43719 Chronic migraine without aura, intractable, without status migrainosus: Secondary | ICD-10-CM | POA: Diagnosis not present

## 2023-06-22 ENCOUNTER — Encounter: Payer: Self-pay | Admitting: Physical Medicine and Rehabilitation

## 2023-06-22 ENCOUNTER — Ambulatory Visit: Payer: Medicare Other | Admitting: Physical Medicine & Rehabilitation

## 2023-06-24 ENCOUNTER — Ambulatory Visit
Admission: EM | Admit: 2023-06-24 | Discharge: 2023-06-24 | Disposition: A | Discharge status: Home / Self Care | Payer: Medicare Other | Source: Home / Self Care

## 2023-06-24 DIAGNOSIS — A084 Viral intestinal infection, unspecified: Secondary | ICD-10-CM | POA: Diagnosis not present

## 2023-06-24 DIAGNOSIS — R1030 Lower abdominal pain, unspecified: Secondary | ICD-10-CM | POA: Diagnosis not present

## 2023-06-24 MED ORDER — ONDANSETRON 4 MG PO TBDP: 4.0000 mg | ORAL_TABLET | Freq: Three times a day (TID) | ORAL | 0 refills | Status: DC | PRN

## 2023-06-24 MED ORDER — ONDANSETRON 4 MG PO TBDP
4.0000 mg | ORAL_TABLET | Freq: Once | ORAL | Status: AC
  Administered 2023-06-24: 4 mg via ORAL

## 2023-06-24 NOTE — ED Provider Notes (Signed)
Renaldo Fiddler    CSN: [id number] Arrival date & time: 06/24/23  5366      History   Chief Complaint Chief Complaint  Patient presents with   Nausea   Emesis   Diarrhea    HPI  Yesenia Wood is a 37 y.o. female.  Patient presents with 2-3 day history of fatigue, body aches, nausea, vomiting, diarrhea.  She has lower abdominal discomfort.  No dysuria, hematuria, vaginal discharge, pelvic pain, fever, cough, shortness of breath, chest pain, or other symptoms.  She reports 7 episodes of emesis since midnight.  No diarrhea since yesterday.  Negative COVID test at home x 2.  Treating symptoms with naproxen.  Her medical history includes deaf, muteness, fibromyalgia, chronic migraine headache, chronic pain syndrome, anxiety, depression, restless leg syndrome.  She denies current pregnancy or breastfeeding.     The history is provided by the patient and medical records.    Past Medical History:  Diagnosis Date   ADHD (attention deficit hyperactivity disorder)    B12 deficiency    Bell's palsy    right sided   Chest pain    a. 05/2015 ETT: elev HR/BP, but no acute ST/T changes; b. 09/2019 Cor CTA: Nl cors, Ca2+ = 0.   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   Fatigue    Fatty liver    Fibromyalgia    GERD (gastroesophageal reflux disease)    Headache    High triglycerides    Hyperglycemia    Hypertension    Hyperthyroidism    Moderate asthma    Obesity    Paresthesia    Premature birth     Patient Active Problem List   Diagnosis Date Noted   Acute right ankle pain 06/14/2023   Sprain of left ankle 05/01/2023   Chronic pain syndrome 03/03/2023   Nausea 08/12/2022   Dyslipidemia 02/10/2022   Vitamin D deficiency 02/10/2022   Positive ANA (antinuclear antibody) 12/20/2021   Fibromyalgia 12/20/2021   Sleep difficulties 04/30/2020   Right sided numbness 12/26/2019    RLS (restless legs syndrome) 09/30/2019   MDD (major depressive disorder), recurrent episode, moderate (HCC) 07/17/2019   Social anxiety disorder 07/17/2019   Muteness 09/10/2018   OSA (obstructive sleep apnea) 10/10/2016   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   Dissocial personality disorder (HCC) 06/07/2016   Sleep disturbance 05/10/2016   Chronic migraine without aura 03/29/2016   Palpitations 06/09/2015   Deaf 05/16/2015   B12 deficiency 05/16/2015   Cochlear implant status 05/16/2015   Fatty infiltration of liver 05/16/2015   Gastro-esophageal reflux disease without esophagitis 05/16/2015   Generalized headache 05/16/2015   Right-sided Bell's palsy 05/16/2015   Hypertriglyceridemia 05/16/2015   Irregular menstrual cycle 05/16/2015   Overweight 05/16/2015   Paresthesia 05/16/2015   Disorder of labyrinth 05/16/2015   Migraine 08/05/2013   Allergic rhinitis 07/01/2013   Obesity (BMI 30-39.9) 07/01/2013   Bartholin gland cyst 09/16/2008    Past Surgical History:  Procedure Laterality Date   BARTHOLIN GLAND CYST REMOVAL     COCHLEAR IMPLANT     COCHLEAR IMPLANT REMOVAL     DILATATION AND CURETTAGE/HYSTEROSCOPY WITH MINERVA N/A 03/05/2021   Procedure: DILATATION AND CURETTAGE/HYSTEROSCOPY WITH MINERVA;  Surgeon: Hildred Laser, MD;  Location: [facility] ORS;  Service: Gynecology;  Laterality: N/A;   LAPAROSCOPIC TUBAL LIGATION Bilateral 03/26/2018   Procedure: LAPAROSCOPIC TUBAL LIGATION;  Surgeon: Hildred Laser, MD;  Location: [facility] OR[facility];  Service: Gynecology;  Laterality: Bilateral;  with peritoneal biopsies   LAPAROSCOPY     MYRINGOTOMY WITH TUBE PLACEMENT     TUBAL LIGATION  03/26/2018    OB History     Gravida  0   Para  0   Term  0   Preterm  0   AB  0   Living  0      SAB  0   IAB  0   Ectopic  0   Multiple  0   Live Births  0            Home Medications    Prior to Admission medications   Medication Sig Start Date End Date Taking?  Authorizing Provider  ondansetron (ZOFRAN-ODT) 4 MG disintegrating tablet Take 1 tablet (4 mg total) by mouth every 8 (eight) hours as needed for nausea or vomiting. 06/24/23  Yes Mickie Bail, NP  BOTOX 100 units SOLR injection Inject 100 Units into the muscle every 3 (three) months. 01/09/19   [provider]  Cholecalciferol (VITAMIN D) 50 MCG (2000 UT) CAPS Take 1 capsule by mouth daily.    [provider]  Cyanocobalamin (VITAMIN B-12) 500 MCG SUBL Place 1 tablet (500 mcg total) under the tongue once a week. 04/08/20   Alba Cory, MD  dicyclomine (BENTYL) 10 MG capsule Take 1 capsule (10 mg total) by mouth 3 (three) times daily as needed for spasms. 06/06/23 10/04/23  Celso Amy, PA-C  doxylamine, Sleep, (UNISOM) 25 MG tablet Take 25 mg by mouth at bedtime as needed.    [provider]  Elagolix Sodium (ORILISSA) 200 MG TABS Take 1 tablet (200 mg total) by mouth in the morning and at bedtime. 03/31/23   Hildred Laser, MD  famotidine (PEPCID) 40 MG tablet Take 1 tablet (40 mg total) by mouth daily. For reflux and nausea  in am's 02/10/23   Alba Cory, MD  ferrous sulfate 325 (65 FE) MG tablet Take 325 mg by mouth daily with breakfast.    [provider]  FIBER ADULT GUMMIES PO Take 15 mg by mouth daily.    [provider]  folic acid (FOLVITE) 800 MCG tablet Take 800 mcg by mouth daily. 800 mcg    [provider]  loratadine (CLARITIN) 10 MG tablet TAKE 1 TABLET BY MOUTH EVERY DAY 09/12/22   Alba Cory, MD  metoprolol succinate (TOPROL-XL) 50 MG 24 hr tablet Take 1 tablet (50 mg total) by mouth daily. 01/26/23   Iran Ouch, MD  naproxen (NAPROSYN) 500 MG tablet Take 1 tablet (500 mg total) by mouth daily as needed (take with benadryll). 06/14/23   Angelina Sheriff, DO  NON FORMULARY CBD soft gel as needed.    [provider]  norethindrone (AYGESTIN) 5 MG tablet Take 1 tablet (5 mg total) by mouth daily. 06/12/23    Hildred Laser, MD  omeprazole (PRILOSEC) 40 MG capsule Take 1 capsule (40 mg total) by mouth at bedtime. 06/06/23 09/04/23  Celso Amy, PA-C  polyethylene glycol powder (GLYCOLAX/MIRALAX) 17 GM/SCOOP powder Take 17 g by mouth daily. 08/12/22   Alba Cory, MD  rosuvastatin (CRESTOR) 20 MG tablet Take 1 tablet (20 mg total) by mouth daily. 02/10/23   Alba Cory, MD    Family History Family History  Adopted: Yes  Family history unknown: Yes    Social History Social History   Tobacco Use   Smoking status:  Former    Current packs/day: 0.00    Average packs/day: 1 pack/day for 2.0 years (2.0 ttl pk-yrs)    Types: Cigarettes    Start date: 11/28/2004    Quit date: 11/28/2006    Years since quitting: 16.5   Smokeless tobacco: Never  Vaping Use   Vaping status: Never Used  Substance Use Topics   Alcohol use: Yes    Comment: 1 every 3 months   Drug use: No     Allergies   Budesonide-formoterol fumarate, Astelin [azelastine], Gabapentin, Nortriptyline, Fluogen [influenza virus vaccine], Lamictal [lamotrigine], Milnacipran, Tegaderm ag mesh [silver], Diclofenac, Duloxetine hcl, Montelukast sodium, Mupirocin, Naproxen, Naproxen sodium, Other, Oxycodone hcl, and Sulfa antibiotics   Review of Systems Review of Systems  Constitutional:  Positive for fatigue. Negative for chills and fever.  HENT:  Negative for ear pain and sore throat.   Respiratory:  Negative for cough and shortness of breath.   Gastrointestinal:  Positive for abdominal pain, diarrhea, nausea and vomiting.     Physical Exam Triage Vital Signs ED Triage Vitals  Encounter Vitals Group     BP      Systolic BP Percentile      Diastolic BP Percentile      Pulse      Resp      Temp      Temp src      SpO2      Weight      Height      Head Circumference      Peak Flow      Pain Score      Pain Loc      Pain Education      Exclude from Growth Chart    No data found.  Updated Vital Signs BP 119/79    Pulse 91   Temp 99 F (37.2 C)   Resp 18   LMP  (LMP Unknown) Comment: irregular due to medication  SpO2 96%   Visual Acuity Right Eye Distance:   Left Eye Distance:   Bilateral Distance:    Right Eye Near:   Left Eye Near:    Bilateral Near:     Physical Exam Vitals and nursing note reviewed.  Constitutional:      General: She is not in acute distress.    Appearance: She is well-developed.  HENT:     Right Ear: Tympanic membrane normal.     Left Ear: Tympanic membrane normal.     Nose: Nose normal.     Mouth/Throat:     Mouth: Mucous membranes are moist.     Pharynx: Oropharynx is clear.  Cardiovascular:     Rate and Rhythm: Normal rate and regular rhythm.     Heart sounds: Normal heart sounds.  Pulmonary:     Effort: Pulmonary effort is normal. No respiratory distress.     Breath sounds: Normal breath sounds.  Abdominal:     General: Bowel sounds are normal.     Palpations: Abdomen is soft.     Tenderness: There is abdominal tenderness in the right lower quadrant, suprapubic area and left lower quadrant. There is no guarding or rebound.     Comments: Mild tenderness to palpation across lower abdomen.  Rebound or guarding.  No CVAT.  Musculoskeletal:     Cervical back: Neck supple.  Skin:    General: Skin is warm and dry.  Neurological:     Mental Status: She is alert.      UC Treatments /  Results  Labs (all labs ordered are listed, but only abnormal results are displayed) Labs Reviewed - No data to display  EKG   Radiology No results found.  Procedures Procedures (including critical care time)  Medications Ordered in UC Medications  ondansetron (ZOFRAN-ODT) disintegrating tablet 4 mg (4 mg Oral Given 06/24/23 1035)    Initial Impression / Assessment and Plan / UC Course  I have reviewed the triage vital signs and the nursing notes.  Pertinent labs & imaging results that were available during my care of the patient were reviewed by me and  considered in my medical decision making (see chart for details).    Viral gastroenteritis, mild lower abdominal pain.  Vital signs are stable.  Patient reports 7 episodes of emesis since midnight.  She states she has been able to drink fluids.  She declines transfer to the ED. Zofran given here and patient able to drink 8 ounces of water without emesis.  Discharging with prescription for Zofran.  Discussed clear liquid diet.  Instructed patient to advance to diarrhea diet as tolerated.  Discussed maintaining oral hydration at home; ED precautions discussed.  Education provided on viral gastroenteritis, abdominal pain.  Instructed patient to follow-up with her PCP.  She agrees to plan of care.   Final Clinical Impressions(s) / UC Diagnoses   Final diagnoses:  Viral gastroenteritis  Lower abdominal pain     Discharge Instructions      Take the antinausea medication as directed.    Keep yourself hydrated with clear liquids, such as water and Gatorade.  Follow the diarrhea diet as tolerated.   Go to the emergency department if you have worsening symptoms.    Follow up with your primary care provider.          ED Prescriptions     Medication Sig Dispense Auth. Provider   ondansetron (ZOFRAN-ODT) 4 MG disintegrating tablet Take 1 tablet (4 mg total) by mouth every 8 (eight) hours as needed for nausea or vomiting. 20 tablet Mickie Bail, NP      PDMP not reviewed this encounter.   Mickie Bail, NP 06/24/23 1128

## 2023-06-24 NOTE — ED Triage Notes (Addendum)
Triage completed using ASL interpreter via ipad Lanora Manis #[id number]).  Reports that she started feeling poorly on Wednesday afternoon (also started experiencing seasonal allergies).   Complaints of nausea/ vomiting/ diarrhea that started yesterday. Describes a full sensation in her lower throat. Also having generalized body and muscle aches. Possible fevers last night. Has been able to keep down some soup and broth. Reports at times she would wake up during the night having some SHOB.  Negative Covid test x2. Denies any known sick contacts.   Taking naproxen.

## 2023-06-24 NOTE — ED Notes (Signed)
Fluids provided for fluid challenge. Able to keep down sips of water.

## 2023-06-24 NOTE — Discharge Instructions (Addendum)
Take the antinausea medication as directed.    Keep yourself hydrated with clear liquids, such as water and Gatorade.  Follow the diarrhea diet as tolerated.   Go to the emergency department if you have worsening symptoms.    Follow up with your primary care provider.      

## 2023-06-25 ENCOUNTER — Encounter: Payer: Self-pay | Admitting: Family Medicine

## 2023-06-25 ENCOUNTER — Encounter: Payer: Self-pay | Admitting: Physical Medicine & Rehabilitation

## 2023-06-26 ENCOUNTER — Ambulatory Visit (INDEPENDENT_AMBULATORY_CARE_PROVIDER_SITE_OTHER): Payer: Medicare Other | Admitting: Licensed Clinical Social Worker

## 2023-06-26 DIAGNOSIS — F4010 Social phobia, unspecified: Secondary | ICD-10-CM

## 2023-06-26 DIAGNOSIS — F331 Major depressive disorder, recurrent, moderate: Secondary | ICD-10-CM | POA: Diagnosis not present

## 2023-06-26 NOTE — Progress Notes (Signed)
Virtual Visit via Audio Note  I connected with Yesenia Wood on 06/26/23 at 11:00 AM EDT by an audio enabled telemedicine application and verified that I am speaking with the correct person using two identifiers.Sign language interpreter was on audio line for assistance.   Location: Patient: home Provider: remote office [place], [place])   I discussed the limitations of evaluation and management by telemedicine and the availability of in person appointments. The patient expressed understanding and agreed to proceed.   I discussed the assessment and treatment plan with the patient. The patient was provided an opportunity to ask questions and all were answered. The patient agreed with the plan and demonstrated an understanding of the instructions.   The patient was advised to call back or seek an in-person evaluation if the symptoms worsen or if the condition fails to improve as anticipated.  I provided 50 minutes of non-face-to-face time during this encounter.   Nianna Igo R Ahren Pettinger, LCSW   THERAPIST PROGRESS NOTE  Session Time: 11-1150a              Participation Level: Active  Behavioral Response: NAAlertAnxious  Type of Therapy: Individual Therapy  Treatment Goals addressed:  Develop healthy thinking patterns and beliefs about self, others, and the world that lead to the alleviation and help prevent the relapse of depression per self report 3 out of 5 sessions documented    Learn and implement coping skills that result in a reduction of anxiety and worry, and improve daily functioning per pt report 3 out of 5 sessions documented   Interventions: CBT; supportive; narrative   Summary: AZRIEL Wood is a 37 y.o. female who presents with continuing symptoms related to depression and social anxiety diagnoses.    Clinician assisted pt with identifying situations/scenarios/schemas triggering anxiety and/or depression symptoms. Pt reports that she is on medication to help manage  cramps and it is also working to stabilize mood. Pt denies any significant depression episodes since last session in May. Allowed pt to explore and express thoughts and feelings and discussed current coping mechanisms. Pt reports that she is continuing to engage in healthy eating and exercise behaviors. Pt reports she is doing pilates at home. Pt states she has started doing acupuncture and feels that it has given her more energy and motivation to engage in hobbies that she has enjoyed in the past--like cross stitching. Pt reports that she just finished one cross stitching project and is looking forward to doing more. Encouraged pt to continue w/ hobbies, exercise, and overall health promoting behaviors.   Pt reports that she lost her grandmother recently but it was expected so she and her parents are accepting the circumstances. Discussed stages of grief and how one can get to the stage of acceptance quickly after a prolonged illness or suffering.   Continued recommendations are as follows: self care behaviors, positive social engagements, focusing on overall work/home/life balance, and focusing on positive physical and emotional wellness.   Suicidal/Homicidal: No  Therapist Response: Pt is continuing to apply interventions learned in session into daily life situations. Personal growth and progress is continuing at time of session.Treatment to continue as indicated.   Plan: Return again in 4 weeks.  Diagnosis:  Encounter Diagnoses  Name Primary?   MDD (major depressive disorder), recurrent episode, moderate (HCC) Yes   Social anxiety disorder     Collaboration of Care: Other pt requested referral back to psychiatrist of record, Dr. Jomarie Longs  Patient/Guardian was advised Release of Information must  be obtained prior to any record release in order to collaborate their care with an outside provider. Patient/Guardian was advised if they have not already done so to contact the registration  department to sign all necessary forms in order for Yesenia Wood to release information regarding their care.   Consent: Patient/Guardian gives verbal consent for treatment and assignment of benefits for services provided during this visit. Patient/Guardian expressed understanding and agreed to proceed.       Ernest Haber Trek Kimball, LCSW 06/26/2023

## 2023-06-26 NOTE — Patient Instructions (Addendum)
  Outpatient Psychiatry and Counseling  FOR CRISIS:  call 911, [facility]: Mobile Crisis Management 24 hours:  [phone], call [phone], [facility]--[address] walk in), or go to your local EMERGENCY DEPARTMENT  [facility] [address], [place], [place] [address]  [phone]  [facility] [address] [place], [place] [address] [phone]  [facility] [address] [place],  [place]  [address] [phone]  [facility] Address: [address] Maurine Cane [place], [place] [address] Phone: [phone]  [facility] [place] and [place] Locations) [email or web address]  [employer] [facility] [employer] fee and walk in schedule: M-F 8am-12pm/1pm-3pm [address]  [place], [place] [address] [phone]  [facility] [address] [place], [place] [address] [phone]  [facility]patient Services/ Intensive Outpatient Therapy Program/CDIOP/PHP [address] [place], [place] [address] [phone]  [facility], Outpatient Therapy Services, [place]      [phone]     [address]    [place], [place] [address]                 [facility]   [facility] [phone]. [address] [place], [place] [address]  [facility]          [address]  [place], [place] [address]       [phone]  [facility] [address] [place], [place] [address] [phone]  [facility]    [address]    [place], [place] [address]     [phone]       [facility] [address] [place] [address]  [facility]     [address] [place], [place]      [phone]       [facility] Eulogio Ditch, MD [address] [place], [place] [address] [phone]  [facility]     [address]     [place], [place] [address]     [phone]       [facility] [address] [place], [place] [address] [phone] Resources for Temporary Residential Assistance/Crisis Centers

## 2023-06-27 ENCOUNTER — Encounter: Payer: Self-pay | Admitting: Family Medicine

## 2023-06-27 ENCOUNTER — Encounter: Payer: Medicare Other | Admitting: Physical Medicine & Rehabilitation

## 2023-06-30 ENCOUNTER — Ambulatory Visit: Payer: Medicare Other

## 2023-06-30 VITALS — Ht 63.0 in | Wt 175.0 lb

## 2023-06-30 DIAGNOSIS — Z0000 Encounter for general adult medical examination without abnormal findings: Secondary | ICD-10-CM

## 2023-06-30 NOTE — Patient Instructions (Signed)
Yesenia Wood , Thank you for taking time to come for your Medicare Wellness Visit. I appreciate your ongoing commitment to your health goals. Please review the following plan we discussed and let me know if I can assist you in the future.   Referrals/Orders/Follow-Ups/Clinician Recommendations: none  This is a list of the screening recommended for you and due dates:  Health Maintenance  Topic Date Due   Flu Shot  06/29/2023   Pap Smear  03/17/2024   Medicare Annual Wellness Visit  06/29/2024   DTaP/Tdap/Td vaccine (3 - Td or Tdap) 03/16/2026   COVID-19 Vaccine  Completed   Hepatitis C Screening  Completed   HIV Screening  Completed   HPV Vaccine  Aged Out    Advanced directives: (Declined) Advance directive discussed with you today. Even though you declined this today, please call our office should you change your mind, and we can give you the proper paperwork for you to fill out.  Next Medicare Annual Wellness Visit scheduled for next year: Yes 07/05/24 @ 3:15pm telephone  Preventive Care 44-72 Years Old, Female Preventive care refers to lifestyle choices and visits with your health care provider that can promote health and wellness. Preventive care visits are also called wellness exams. What can I expect for my preventive care visit? Counseling During your preventive care visit, your health care provider may ask about your: Medical history, including: Past medical problems. Family medical history. Pregnancy history. Current health, including: Menstrual cycle. Method of birth control. Emotional well-being. Home life and relationship well-being. Sexual activity and sexual health. Lifestyle, including: Alcohol, nicotine or tobacco, and drug use. Access to firearms. Diet, exercise, and sleep habits. Work and work Astronomer. Sunscreen use. Safety issues such as seatbelt and bike helmet use. Physical exam Your health care provider may check your: Height and weight. These may  be used to calculate your BMI (body mass index). BMI is a measurement that tells if you are at a healthy weight. Waist circumference. This measures the distance around your waistline. This measurement also tells if you are at a healthy weight and may help predict your risk of certain diseases, such as type 2 diabetes and high blood pressure. Heart rate and blood pressure. Body temperature. Skin for abnormal spots. What immunizations do I need? Vaccines are usually given at various ages, according to a schedule. Your health care provider will recommend vaccines for you based on your age, medical history, and lifestyle or other factors, such as travel or where you work. What tests do I need? Screening Your health care provider may recommend screening tests for certain conditions. This may include: Pelvic exam and Pap test. Lipid and cholesterol levels. Diabetes screening. This is done by checking your blood sugar (glucose) after you have not eaten for a while (fasting). Hepatitis B test. Hepatitis C test. HIV (human immunodeficiency virus) test. STI (sexually transmitted infection) testing, if you are at risk. BRCA-related cancer screening. This may be done if you have a family history of breast, ovarian, tubal, or peritoneal cancers. Talk with your health care provider about your test results, treatment options, and if necessary, the need for more tests. Follow these instructions at home: Eating and drinking  Eat a healthy diet that includes fresh fruits and vegetables, whole grains, lean protein, and low-fat dairy products. Take vitamin and mineral supplements as recommended by your health care provider. Do not drink alcohol if: Your health care provider tells you not to drink. You are pregnant, may be pregnant, or  are planning to become pregnant. If you drink alcohol: Limit how much you have to 0-1 drink a day. Know how much alcohol is in your drink. In the [place] one drink equals one 12  oz bottle of beer (355 mL), one 5 oz glass of wine (148 mL), or one 1 oz glass of hard liquor (44 mL). Lifestyle Brush your teeth every morning and night with fluoride toothpaste. Floss one time each day. Exercise for at least 30 minutes 5 or more days each week. Do not use any products that contain nicotine or tobacco. These products include cigarettes, chewing tobacco, and vaping devices, such as e-cigarettes. If you need help quitting, ask your health care provider. Do not use drugs. If you are sexually active, practice safe sex. Use a condom or other form of protection to prevent STIs. If you do not wish to become pregnant, use a form of birth control. If you plan to become pregnant, see your health care provider for a prepregnancy visit. Find healthy ways to manage stress, such as: Meditation, yoga, or listening to music. Journaling. Talking to a trusted person. Spending time with friends and family. Minimize exposure to UV radiation to reduce your risk of skin cancer. Safety Always wear your seat belt while driving or riding in a vehicle. Do not drive: If you have been drinking alcohol. Do not ride with someone who has been drinking. If you have been using any mind-altering substances or drugs. While texting. When you are tired or distracted. Wear a helmet and other protective equipment during sports activities. If you have firearms in your house, make sure you follow all gun safety procedures. Seek help if you have been physically or sexually abused. What's next? Go to your health care provider once a year for an annual wellness visit. Ask your health care provider how often you should have your eyes and teeth checked. Stay up to date on all vaccines. This information is not intended to replace advice given to you by your health care provider. Make sure you discuss any questions you have with your health care provider. Document Revised: 05/12/2021 Document Reviewed:  05/12/2021 Elsevier Patient Education  2022 [employer].

## 2023-06-30 NOTE — Progress Notes (Signed)
Subjective:   Yesenia Wood is a 37 y.o. female who presents for Medicare Annual (Subsequent) preventive examination.  Visit Complete: Virtual  I connected with  Yesenia Wood on 06/30/23 by a audio enabled telemedicine application and verified that I am speaking with the correct person using two identifiers.  Patient Location: Home  Provider Location: Office/Clinic  I discussed the limitations of evaluation and management by telemedicine. The patient expressed understanding and agreed to proceed.  Vital Signs: Unable to obtain new vitals due to this being a telehealth visit.  Patient Medicare AWV questionnaire was completed by the patient on (not done); I have confirmed that all information answered by patient is correct and no changes since this date.  Review of Systems    Cardiac Risk Factors include: advanced age (>60men, >49 women);dyslipidemia;obesity (BMI >30kg/m2)    Objective:    Today's Vitals   06/30/23 1530  Weight: 175 lb (79.4 kg)  Height: 5\' 3"  (1.6 m)  PainSc: 4    Body mass index is 31 kg/m.     06/24/2023   10:09 AM 03/10/2023    9:41 AM 02/10/2022   11:03 AM 07/18/2021    6:17 PM 07/17/2021   12:58 PM 03/05/2021   11:38 AM 02/25/2021   11:56 AM  Advanced Directives  Does Patient Have a Medical Advance Directive? No No No No No No No  Would patient like information on creating a medical advance directive?  No - Patient declined Yes (MAU/Ambulatory/Procedural Areas - Information given)   No - Patient declined No - Patient declined    Current Medications (verified) Outpatient Encounter Medications as of 06/30/2023  Medication Sig   BOTOX 100 units SOLR injection Inject 100 Units into the muscle every 3 (three) months.   Cholecalciferol (VITAMIN D) 50 MCG (2000 UT) CAPS Take 1 capsule by mouth daily.   Cyanocobalamin (VITAMIN B-12) 500 MCG SUBL Place 1 tablet (500 mcg total) under the tongue once a week.   dicyclomine (BENTYL) 10 MG capsule Take 1  capsule (10 mg total) by mouth 3 (three) times daily as needed for spasms.   doxylamine, Sleep, (UNISOM) 25 MG tablet Take 25 mg by mouth at bedtime as needed.   Elagolix Sodium (ORILISSA) 200 MG TABS Take 1 tablet (200 mg total) by mouth in the morning and at bedtime.   famotidine (PEPCID) 40 MG tablet Take 1 tablet (40 mg total) by mouth daily. For reflux and nausea  in am's   ferrous sulfate 325 (65 FE) MG tablet Take 325 mg by mouth daily with breakfast.   FIBER ADULT GUMMIES PO Take 15 mg by mouth daily.   folic acid (FOLVITE) 800 MCG tablet Take 800 mcg by mouth daily. 800 mcg   loratadine (CLARITIN) 10 MG tablet TAKE 1 TABLET BY MOUTH EVERY DAY   metoprolol succinate (TOPROL-XL) 50 MG 24 hr tablet Take 1 tablet (50 mg total) by mouth daily.   naproxen (NAPROSYN) 500 MG tablet Take 1 tablet (500 mg total) by mouth daily as needed (take with benadryll).   NON FORMULARY CBD soft gel as needed.   norethindrone (AYGESTIN) 5 MG tablet Take 1 tablet (5 mg total) by mouth daily.   omeprazole (PRILOSEC) 40 MG capsule Take 1 capsule (40 mg total) by mouth at bedtime.   ondansetron (ZOFRAN-ODT) 4 MG disintegrating tablet Take 1 tablet (4 mg total) by mouth every 8 (eight) hours as needed for nausea or vomiting.   polyethylene glycol powder (GLYCOLAX/MIRALAX) 17 GM/SCOOP  powder Take 17 g by mouth daily.   rosuvastatin (CRESTOR) 20 MG tablet Take 1 tablet (20 mg total) by mouth daily.   No facility-administered encounter medications on file as of 06/30/2023.    Allergies (verified) Budesonide-formoterol fumarate, Astelin [azelastine], Gabapentin, Nortriptyline, Fluogen [influenza virus vaccine], Lamictal [lamotrigine], Milnacipran, Tegaderm ag mesh [silver], Diclofenac, Duloxetine hcl, Montelukast sodium, Mupirocin, Naproxen, Naproxen sodium, Other, Oxycodone hcl, and Sulfa antibiotics   History: Past Medical History:  Diagnosis Date   ADHD (attention deficit hyperactivity disorder)    B12  deficiency    Bell's palsy    right sided   Chest pain    a. 05/2015 ETT: elev HR/BP, but no acute ST/T changes; b. 09/2019 Cor CTA: Nl cors, Ca2+ = 0.   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   Fatigue    Fatty liver    Fibromyalgia    GERD (gastroesophageal reflux disease)    Headache    High triglycerides    Hyperglycemia    Hypertension    Hyperthyroidism    Moderate asthma    Obesity    Paresthesia    Premature birth    Past Surgical History:  Procedure Laterality Date   BARTHOLIN GLAND CYST REMOVAL     COCHLEAR IMPLANT     COCHLEAR IMPLANT REMOVAL     DILATATION AND CURETTAGE/HYSTEROSCOPY WITH MINERVA N/A 03/05/2021   Procedure: DILATATION AND CURETTAGE/HYSTEROSCOPY WITH MINERVA;  Surgeon: Hildred Laser, MD;  Location: [facility] OR[facility];  Service: Gynecology;  Laterality: N/A;   LAPAROSCOPIC TUBAL LIGATION Bilateral 03/26/2018   Procedure: LAPAROSCOPIC TUBAL LIGATION;  Surgeon: Hildred Laser, MD;  Location: [facility] OR[facility];  Service: Gynecology;  Laterality: Bilateral;  with peritoneal biopsies   LAPAROSCOPY     MYRINGOTOMY WITH TUBE PLACEMENT     TUBAL LIGATION  03/26/2018   Family History  Adopted: Yes  Family history unknown: Yes   Social History   Socioeconomic History   Marital status: Single    Spouse name: Not on file   Number of children: 0   Years of education: Not on file   Highest education level: Some college, no degree  Occupational History   Occupation: disability for hearing loss  Tobacco Use   Smoking status: Former    Current packs/day: 0.00    Average packs/day: 1 pack/day for 2.0 years (2.0 ttl pk-yrs)    Types: Cigarettes    Start date: 11/28/2004    Quit date: 11/28/2006    Years since quitting: 16.5   Smokeless tobacco: Never  Vaping Use   Vaping status: Never Used  Substance and Sexual Activity   Alcohol use: Yes    Comment: 1 every 3 months   Drug use: No    Sexual activity: Not Currently    Birth control/protection: Pill    Comment: Seasonale  Other Topics Concern   Not on file  Social History Narrative   Lives with her adopted  parents   Hearing loss since birth    Social Determinants of Health   Financial Resource Strain: Low Risk  (06/30/2023)   Overall Financial Resource Strain (CARDIA)    Difficulty of Paying Living Expenses: Not hard at all  Food Insecurity: No Food Insecurity (06/30/2023)   Hunger Vital Sign    Worried About Running Out of Food in the Last Year: Never true    Ran Out of Food in the Last  Year: Never true  Transportation Needs: No Transportation Needs (06/30/2023)   PRAPARE - Administrator, Civil Service (Medical): No    Lack of Transportation (Non-Medical): No  Physical Activity: Sufficiently Active (06/30/2023)   Exercise Vital Sign    Days of Exercise per Week: 5 days    Minutes of Exercise per Session: 60 min  Stress: No Stress Concern Present (06/30/2023)   [employer] of Occupational Health - Occupational Stress Questionnaire    Feeling of Stress : Not at all  Social Connections: Socially Isolated (06/30/2023)   Social Connection and Isolation Panel [NHANES]    Frequency of Communication with Friends and Family: Once a week    Frequency of Social Gatherings with Friends and Family: Once a week    Attends Religious Services: Never    Database administrator or Organizations: Yes    Attends Banker Meetings: Never    Marital Status: Never married    Tobacco Counseling Counseling given: Not Answered   Clinical Intake:  Pre-visit preparation completed: Yes  Pain : 0-10 Pain Score: 4  Pain Type: Chronic pain Pain Location: Generalized Pain Descriptors / Indicators: Aching Pain Onset: More than a month ago Pain Frequency: Constant Pain Relieving Factors: medication Naproxen  Pain Relieving Factors: medication Naproxen  BMI - recorded: 31 Nutritional Status: BMI > 30   Obese Nutritional Risks: None Diabetes: No  How often do you need to have someone help you when you read instructions, pamphlets, or other written materials from your doctor or pharmacy?: 1 - Never  Interpreter Needed?: No (call done through deaf services on pt phone)  Comments: lives with parents Information entered by :: B.Shalik Sanfilippo,LPN   Activities of Daily Living    06/30/2023    3:49 PM 05/25/2023   11:34 AM  In your present state of health, do you have any difficulty performing the following activities:  Hearing? 0 0  Vision? 0 0  Difficulty concentrating or making decisions? 1 0  Walking or climbing stairs? 1 0  Dressing or bathing? 1 0  Doing errands, shopping? 0 0  Preparing Food and eating ? N   Using the Toilet? N   In the past six months, have you accidently leaked urine? N   Do you have problems with loss of bowel control? N   Managing your Medications? N   Managing your Finances? N   Housekeeping or managing your Housekeeping? N     Patient Care Team: Alba Cory, MD as PCP - General (Family Medicine) Iran Ouch, MD as Consulting Physician (Cardiology) Hildred Laser, MD as Referring Physician (Obstetrics and Gynecology) Jomarie Longs, MD as Consulting Physician (Psychiatry) Santiago Glad, MD as Referring Physician (Neurology) Fuller Plan, MD as Consulting Physician (Rheumatology)  Indicate any recent Medical Services you may have received from other than [facility] providers in the past year (date may be approximate).     Assessment:   This is a routine wellness examination for Radie.  Hearing/Vision screen Hearing Screening - Comments:: Pt is deaf Vision Screening - Comments:: Adequate vision w/glasses [facility]  Dietary issues and exercise activities discussed:     Goals Addressed   None    Depression Screen    06/30/2023    3:45 PM 06/16/2023   10:22 AM 06/14/2023   10:34 AM 06/09/2023   10:24 AM 06/09/2023   10:21 AM  06/02/2023    1:04 PM 05/25/2023   11:34 AM  PHQ 2/9 Scores  PHQ -  2 Score 0 2 0 0 0 2 0    Fall Risk    06/30/2023    3:36 PM 06/16/2023   10:22 AM 06/14/2023   10:34 AM 06/09/2023   10:24 AM 06/09/2023   10:21 AM  Fall Risk   Falls in the past year? 1 0 0 0 0  Number falls in past yr: 1 0 0 0 0  Injury with Fall? 0 0 0 0 0  Risk for fall due to : No Fall Risks      Follow up Education provided;Falls prevention discussed        MEDICARE RISK AT HOME:  Medicare Risk at Home - 06/30/23 1537     Any stairs in or around the home? Yes   ramp   If so, are there any without handrails? Yes    Home free of loose throw rugs in walkways, pet beds, electrical cords, etc? Yes    Adequate lighting in your home to reduce risk of falls? Yes    Life alert? No    Use of a cane, walker or w/c? No    Grab bars in the bathroom? Yes    Shower chair or bench in shower? Yes    Elevated toilet seat or a handicapped toilet? Yes             TIMED UP AND GO:  Was the test performed?  No    Cognitive Function:        06/30/2023    3:54 PM  6CIT Screen  What Year? 0 points  What month? 0 points  What time? 0 points  Count back from 20 0 points  Months in reverse 4 points  Repeat phrase 6 points  Total Score 10 points    Immunizations Immunization History  Administered Date(s) Administered   COVID-19, mRNA, vaccine(Comirnaty)12 years and older 09/19/2022   Influenza,inj,Quad PF,6+ Mos 07/20/2015, 09/01/2017, 09/25/2018, 08/09/2019, 09/08/2020, 07/26/2021, 08/12/2022   Influenza-Unspecified 09/12/2014, 08/24/2016   PFIZER Comirnaty(Gray Top)Covid-19 Tri-Sucrose Vaccine 03/03/2020, 03/24/2020, 10/30/2020, 05/21/2021   PNEUMOCOCCAL CONJUGATE-20 07/26/2021   Pfizer Covid-19 Vaccine Bivalent Booster 70yrs & up 11/02/2021   Pneumococcal Conjugate-13 11/24/2014   Pneumococcal Polysaccharide-23 11/24/2014   Tdap 11/28/2009, 03/16/2016    TDAP status: Up to date  Flu Vaccine status: Up to  date  Pneumococcal vaccine status: Up to date  Covid-19 vaccine status: Completed vaccines  Qualifies for Shingles Vaccine? No     Screening Tests Health Maintenance  Topic Date Due   INFLUENZA VACCINE  06/29/2023   PAP SMEAR-Modifier  03/17/2024   Medicare Annual Wellness (AWV)  06/29/2024   DTaP/Tdap/Td (3 - Td or Tdap) 03/16/2026   COVID-19 Vaccine  Completed   Hepatitis C Screening  Completed   HIV Screening  Completed   HPV VACCINES  Aged Out    Health Maintenance  Health Maintenance Due  Topic Date Due   INFLUENZA VACCINE  06/29/2023     Lung Cancer Screening: (Low Dose CT Chest recommended if Age 9-80 years, 20 pack-year currently smoking OR have quit w/in 15years.) does not qualify.   Lung Cancer Screening Referral: no  Additional Screening:  Hepatitis C Screening: does not qualify; Completed yes  Vision Screening: Recommended annual ophthalmology exams for early detection of glaucoma and other disorders of the eye. Is the patient up to date with their annual eye exam?  Yes  Who is the provider or what is the name of the office in which the patient attends annual eye  exams?  Eye If pt is not established with a provider, would they like to be referred to a provider to establish care? No .   Dental Screening: Recommended annual dental exams for proper oral hygiene  Diabetic Foot Exam: n/a  Community Resource Referral / Chronic Care Management: CRR required this visit?  No   CCM required this visit?  No    Plan:     I have personally reviewed and noted the following in the patient's chart:   Medical and social history Use of alcohol, tobacco or illicit drugs  Current medications and supplements including opioid prescriptions. Patient is not currently taking opioid prescriptions. Functional ability and status Nutritional status Physical activity Advanced directives List of other physicians Hospitalizations, surgeries, and ER visits in  previous 12 months Vitals Screenings to include cognitive, depression, and falls Referrals and appointments  In addition, I have reviewed and discussed with patient certain preventive protocols, quality metrics, and best practice recommendations. A written personalized care plan for preventive services as well as general preventive health recommendations were provided to patient.    Sue Lush, LPN   11/30/2438   After Visit Summary: (MyChart) Due to this being a telephonic visit, the after visit summary with patients personalized plan was offered to patient via MyChart   Nurse Notes: Pt states she is doing alright. She lives in her parents home downstairs (parents live upstairs). She asks what can she replace Flu vaccine with (as she had allergic rx to). She will discuss with provider next month with appt. Pt voices no other concerns or questions at this time.

## 2023-07-04 ENCOUNTER — Encounter: Payer: Self-pay | Admitting: Family Medicine

## 2023-07-04 ENCOUNTER — Ambulatory Visit (INDEPENDENT_AMBULATORY_CARE_PROVIDER_SITE_OTHER): Payer: Medicare Other | Admitting: Family Medicine

## 2023-07-04 ENCOUNTER — Ambulatory Visit
Admission: RE | Admit: 2023-07-04 | Discharge: 2023-07-04 | Disposition: A | Discharge status: Home / Self Care | Payer: Medicare Other | Source: Ambulatory Visit | Attending: Physician Assistant | Admitting: Physician Assistant

## 2023-07-04 VITALS — BP 112/72 | HR 82 | Temp 98.1°F | Resp 16 | Ht 63.0 in | Wt 176.9 lb

## 2023-07-04 DIAGNOSIS — R1084 Generalized abdominal pain: Secondary | ICD-10-CM | POA: Insufficient documentation

## 2023-07-04 DIAGNOSIS — R079 Chest pain, unspecified: Secondary | ICD-10-CM

## 2023-07-04 DIAGNOSIS — R197 Diarrhea, unspecified: Secondary | ICD-10-CM | POA: Diagnosis not present

## 2023-07-04 DIAGNOSIS — R932 Abnormal findings on diagnostic imaging of liver and biliary tract: Secondary | ICD-10-CM | POA: Diagnosis not present

## 2023-07-04 DIAGNOSIS — K529 Noninfective gastroenteritis and colitis, unspecified: Secondary | ICD-10-CM | POA: Diagnosis not present

## 2023-07-04 DIAGNOSIS — R1310 Dysphagia, unspecified: Secondary | ICD-10-CM

## 2023-07-04 DIAGNOSIS — K219 Gastro-esophageal reflux disease without esophagitis: Secondary | ICD-10-CM | POA: Diagnosis not present

## 2023-07-04 DIAGNOSIS — J029 Acute pharyngitis, unspecified: Secondary | ICD-10-CM | POA: Diagnosis not present

## 2023-07-04 DIAGNOSIS — R112 Nausea with vomiting, unspecified: Secondary | ICD-10-CM

## 2023-07-04 LAB — CBC WITH DIFFERENTIAL/PLATELET
Absolute Monocytes: 590 cells/uL (ref 200–950)
Basophils Absolute: 43 cells/uL (ref 0–200)
Basophils Relative: 0.6 %
Eosinophils Absolute: 29 cells/uL (ref 15–500)
Eosinophils Relative: 0.4 %
HCT: 41.9 % (ref 35.0–45.0)
Hemoglobin: 13.6 g/dL (ref 11.7–15.5)
Lymphs Abs: 2707 cells/uL (ref 850–3900)
MCH: 28.2 pg (ref 27.0–33.0)
MCHC: 32.5 g/dL (ref 32.0–36.0)
MCV: 86.9 fL (ref 80.0–100.0)
MPV: 10.4 fL (ref 7.5–12.5)
Monocytes Relative: 8.2 %
Neutro Abs: 3830 cells/uL (ref 1500–7800)
Neutrophils Relative %: 53.2 %
Platelets: 306 10*3/uL (ref 140–400)
RBC: 4.82 10*6/uL (ref 3.80–5.10)
RDW: 12.4 % (ref 11.0–15.0)
Total Lymphocyte: 37.6 %
WBC: 7.2 10*3/uL (ref 3.8–10.8)

## 2023-07-04 LAB — POCT RAPID STREP A (OFFICE): Rapid Strep A Screen: NEGATIVE

## 2023-07-04 MED ORDER — PANTOPRAZOLE SODIUM 40 MG PO TBEC
40.0000 mg | DELAYED_RELEASE_TABLET | Freq: Two times a day (BID) | ORAL | 0 refills | Status: DC
Start: 2023-07-04 — End: 2023-07-27

## 2023-07-04 MED ORDER — SUCRALFATE 1 G PO TABS
1.0000 g | ORAL_TABLET | Freq: Three times a day (TID) | ORAL | 1 refills | Status: DC | PRN
Start: 2023-07-04 — End: 2023-08-10

## 2023-07-04 MED ORDER — ONDANSETRON 4 MG PO TBDP
4.0000 mg | ORAL_TABLET | Freq: Three times a day (TID) | ORAL | 1 refills | Status: DC | PRN
Start: 2023-07-04 — End: 2023-08-10

## 2023-07-04 MED ORDER — FAMOTIDINE 20 MG PO TABS
40.0000 mg | ORAL_TABLET | Freq: Two times a day (BID) | ORAL | 1 refills | Status: DC | PRN
Start: 2023-07-04 — End: 2023-08-01

## 2023-07-04 NOTE — Progress Notes (Unsigned)
Patient ID: Yesenia Wood, female    DOB: Jan 29, 1986, 37 y.o.   MRN: [id number]  PCP: Alba Cory, MD  Chief Complaint  Patient presents with   Nausea   Anorexia    Loss of appetite especially solids would come right back out   Chest Pain    Chest tightness, Chest pain started from center , over few days spreading outward, aches and fatigue worsen   Sore Throat    Subjective:   Yesenia Wood is a 37 y.o. female, presents to clinic with CC of the following:  HPI  OV done with [facility] intepreter Acute illness onset 2 weeks ago viral GI, she went to the ER, she was given zofran, she reports that her symptoms have continued including abdominal pain everywhere, loose stool and nausea and vomiting.  She is able to tolerate fluids or thickened fluids but whenever she eats anything solid she does vomit she denies any blood in her emesis or stool.  She is not had a fever recently.  No rash or jaundice.  She is losing weight and is concerned about this She also is having pain when swallowing. She is established with GI - has been working on GI sx prior to this acute illness and this morning did a ultrasound. Was working on fodmap diet Prior CT was ordered but she did not want to do that or labs She reports no urinary symptoms, she is urinating normally and urine is pale yellow      Patient Active Problem List   Diagnosis Date Noted   Acute right ankle pain 06/14/2023   Sprain of left ankle 05/01/2023   Chronic pain syndrome 03/03/2023   Nausea 08/12/2022   Dyslipidemia 02/10/2022   Vitamin D deficiency 02/10/2022   Positive ANA (antinuclear antibody) 12/20/2021   Fibromyalgia 12/20/2021   Sleep difficulties 04/30/2020   Right sided numbness 12/26/2019   RLS (restless legs syndrome) 09/30/2019   MDD (major depressive disorder), recurrent episode, moderate (HCC) 07/17/2019   Social anxiety disorder 07/17/2019   Muteness 09/10/2018   OSA (obstructive sleep apnea) 10/10/2016    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   Dissocial personality disorder (HCC) 06/07/2016   Sleep disturbance 05/10/2016   Chronic migraine without aura 03/29/2016   Palpitations 06/09/2015   Deaf 05/16/2015   B12 deficiency 05/16/2015   Cochlear implant status 05/16/2015   Fatty infiltration of liver 05/16/2015   Gastro-esophageal reflux disease without esophagitis 05/16/2015   Generalized headache 05/16/2015   Right-sided Bell's palsy 05/16/2015   Hypertriglyceridemia 05/16/2015   Irregular menstrual cycle 05/16/2015   Overweight 05/16/2015   Paresthesia 05/16/2015   Disorder of labyrinth 05/16/2015   Migraine 08/05/2013   Allergic rhinitis 07/01/2013   Obesity (BMI 30-39.9) 07/01/2013   Bartholin gland cyst 09/16/2008      Current Outpatient Medications:    BOTOX 100 units SOLR injection, Inject 100 Units into the muscle every 3 (three) months., Disp: , Rfl:    Cholecalciferol (VITAMIN D) 50 MCG (2000 UT) CAPS, Take 1 capsule by mouth daily., Disp: , Rfl:    Cyanocobalamin (VITAMIN B-12) 500 MCG SUBL, Place 1 tablet (500 mcg total) under the tongue once a week., Disp: 30 tablet, Rfl: 0   dicyclomine (BENTYL) 10 MG capsule, Take 1 capsule (10 mg total) by mouth 3 (three) times daily as needed for spasms., Disp: 90 capsule, Rfl: 3   doxylamine, Sleep, (UNISOM) 25 MG tablet, Take 25 mg by mouth at bedtime as needed., Disp: ,  Rfl:    Elagolix Sodium (ORILISSA) 200 MG TABS, Take 1 tablet (200 mg total) by mouth in the morning and at bedtime., Disp: 60 tablet, Rfl: 5   famotidine (PEPCID) 40 MG tablet, Take 1 tablet (40 mg total) by mouth daily. For reflux and nausea  in am's, Disp: 90 tablet, Rfl: 1   ferrous sulfate 325 (65 FE) MG tablet, Take 325 mg by mouth daily with breakfast., Disp: , Rfl:    FIBER ADULT GUMMIES PO, Take 15 mg by mouth daily., Disp: , Rfl:    folic acid (FOLVITE) 800 MCG tablet, Take 800 mcg by mouth daily. 800 mcg, Disp: , Rfl:    loratadine (CLARITIN) 10  MG tablet, TAKE 1 TABLET BY MOUTH EVERY DAY, Disp: 90 tablet, Rfl: 1   metoprolol succinate (TOPROL-XL) 50 MG 24 hr tablet, Take 1 tablet (50 mg total) by mouth daily., Disp: 90 tablet, Rfl: 3   naproxen (NAPROSYN) 500 MG tablet, Take 1 tablet (500 mg total) by mouth daily as needed (take with benadryll)., Disp: 90 tablet, Rfl: 3   NON FORMULARY, CBD soft gel as needed., Disp: , Rfl:    norethindrone (AYGESTIN) 5 MG tablet, Take 1 tablet (5 mg total) by mouth daily., Disp: 90 tablet, Rfl: 3   omeprazole (PRILOSEC) 40 MG capsule, Take 1 capsule (40 mg total) by mouth at bedtime., Disp: 30 capsule, Rfl: 3   ondansetron (ZOFRAN-ODT) 4 MG disintegrating tablet, Take 1 tablet (4 mg total) by mouth every 8 (eight) hours as needed for nausea or vomiting., Disp: 20 tablet, Rfl: 0   polyethylene glycol powder (GLYCOLAX/MIRALAX) 17 GM/SCOOP powder, Take 17 g by mouth daily., Disp: 3350 g, Rfl: 1   rosuvastatin (CRESTOR) 20 MG tablet, Take 1 tablet (20 mg total) by mouth daily., Disp: 90 tablet, Rfl: 1   Allergies  Allergen Reactions   Budesonide-Formoterol Fumarate Anaphylaxis and Other (See Comments)    Other reaction(s): Other (See Comments) chest pain Chest pain chest pain   Astelin [Azelastine]     Tingling, numbness, nausea   Gabapentin Nausea And Vomiting   Nortriptyline Other (See Comments)    Other reaction(s): Other (See Comments)   Fluogen [Influenza Virus Vaccine]     rash   Lamictal [Lamotrigine]     Self injurious thoughts   Milnacipran Other (See Comments)    Pressure in eyes   Tegaderm Ag Mesh [Silver] Itching   Diclofenac Other (See Comments)    Other reaction(s): Other (See Comments)   Duloxetine Hcl Other (See Comments)    Other reaction(s): Other (See Comments) Other reaction(s): unknown   Montelukast Sodium Other (See Comments)    Other reaction(s): wheezing/sob   Mupirocin Other (See Comments)   Naproxen Other (See Comments) and Rash    Other reaction(s):  rash/itching   Naproxen Sodium Rash    mild rash   Other Rash   Oxycodone Hcl Rash   Sulfa Antibiotics Hives, Rash and Itching     Social History   Tobacco Use   Smoking status: Former    Current packs/day: 0.00    Average packs/day: 1 pack/day for 2.0 years (2.0 ttl pk-yrs)    Types: Cigarettes    Start date: 11/28/2004    Quit date: 11/28/2006    Years since quitting: 16.6   Smokeless tobacco: Never  Vaping Use   Vaping status: Never Used  Substance Use Topics   Alcohol use: Yes    Comment: 1 every 3 months   Drug use: No  Chart Review Today: I personally reviewed active problem list, medication list, allergies, family history, social history, health maintenance, notes from last encounter, lab results, imaging with the patient/caregiver today.   Review of Systems  Constitutional: Negative.   HENT: Negative.    Eyes: Negative.   Respiratory: Negative.    Cardiovascular: Negative.   Gastrointestinal: Negative.   Endocrine: Negative.   Genitourinary: Negative.   Musculoskeletal: Negative.   Skin: Negative.   Allergic/Immunologic: Negative.   Neurological: Negative.   Hematological: Negative.   Psychiatric/Behavioral: Negative.    All other systems reviewed and are negative.      Objective:   Vitals:   07/04/23 1116  BP: 112/72  Pulse: 82  Resp: 16  Temp: 98.1 F (36.7 C)  TempSrc: Oral  SpO2: 95%  Weight: 176 lb 14.4 oz (80.2 kg)  Height: 5\' 3"  (1.6 m)    Body mass index is 31.34 kg/m.  Physical Exam Vitals and nursing note reviewed.  Constitutional:      General: She is not in acute distress.    Appearance: Normal appearance. She is well-developed. She is obese. She is not ill-appearing, toxic-appearing or diaphoretic.  HENT:     Head: Normocephalic and atraumatic.     Right Ear: External ear normal.     Left Ear: External ear normal.     Nose: Nose normal. No congestion.     Mouth/Throat:     Mouth: Mucous membranes are moist.      Pharynx: Oropharynx is clear. Posterior oropharyngeal erythema present. No oropharyngeal exudate.  Eyes:     General: No scleral icterus.       Right eye: No discharge.        Left eye: No discharge.     Conjunctiva/sclera: Conjunctivae normal.  Neck:     Trachea: No tracheal deviation.  Cardiovascular:     Rate and Rhythm: Normal rate and regular rhythm.     Pulses: Normal pulses.     Heart sounds: No murmur heard.    No friction rub. No gallop.  Pulmonary:     Effort: Pulmonary effort is normal. No respiratory distress.     Breath sounds: Normal breath sounds. No stridor. No wheezing, rhonchi or rales.  Abdominal:     General: Bowel sounds are decreased.     Palpations: Abdomen is soft. There is no hepatomegaly, splenomegaly, mass or pulsatile mass.     Tenderness: There is generalized abdominal tenderness. There is no right CVA tenderness, left CVA tenderness, guarding or rebound. Negative signs include Murphy's sign, Rovsing's sign and McBurney's sign.     Hernia: No hernia is present.  Musculoskeletal:        General: Normal range of motion.  Lymphadenopathy:     Cervical: No cervical adenopathy.  Skin:    General: Skin is warm and dry.     Coloration: Skin is not jaundiced or pale.     Findings: No lesion or rash.  Neurological:     Mental Status: She is alert.     Motor: No abnormal muscle tone.     Coordination: Coordination normal.     Gait: Gait normal.  Psychiatric:        Mood and Affect: Mood normal.        Behavior: Behavior normal.      Results for orders placed or performed in visit on 07/04/23  POCT rapid strep A  Result Value Ref Range   Rapid Strep A Screen Negative Negative  Assessment & Plan:   1. Generalized abdominal pain Likely acute on chronic GI issues since acute GI illness Suspect some persisting inflammation - no labs done at the UC, checking labs today - CBC with Differential/Platelet - COMPLETE METABOLIC PANEL WITH GFR -  Lipase  2. Nausea vomiting and diarrhea Concerned that pt is still unable to tolerate solid food - CBC with Differential/Platelet - COMPLETE METABOLIC PANEL WITH GFR - Lipase - sucralfate (CARAFATE) 1 g tablet; Take 1 tablet (1 g total) by mouth 3 (three) times daily with meals as needed. for GERD/reflux/epigastric pain when eating or drinking  Dispense: 30 tablet; Refill: 1 - pantoprazole (PROTONIX) 40 MG tablet; Take 1 tablet (40 mg total) by mouth 2 (two) times daily. One hour before breakfast  Dispense: 60 tablet; Refill: 0 - famotidine (PEPCID) 20 MG tablet; Take 2 tablets (40 mg total) by mouth 2 (two) times daily as needed for heartburn or indigestion. For reflux and nausea  in am's  Dispense: 60 tablet; Refill: 1 - ondansetron (ZOFRAN-ODT) 4 MG disintegrating tablet; Take 1-2 tablets (4-8 mg total) by mouth every 8 (eight) hours as needed for nausea or vomiting.  Dispense: 20 tablet; Refill: 1  3. Gastroenteritis Lingering sx, pain, N, V - CBC with Differential/Platelet - COMPLETE METABOLIC PANEL WITH GFR - Lipase - sucralfate (CARAFATE) 1 g tablet; Take 1 tablet (1 g total) by mouth 3 (three) times daily with meals as needed. for GERD/reflux/epigastric pain when eating or drinking  Dispense: 30 tablet; Refill: 1 - pantoprazole (PROTONIX) 40 MG tablet; Take 1 tablet (40 mg total) by mouth 2 (two) times daily. One hour before breakfast  Dispense: 60 tablet; Refill: 0  4. Chest pain, unspecified type Suspect is it of GI etiology - possibly gastritis/esophagitis, tx with PPI and carafate - sucralfate (CARAFATE) 1 g tablet; Take 1 tablet (1 g total) by mouth 3 (three) times daily with meals as needed. for GERD/reflux/epigastric pain when eating or drinking  Dispense: 30 tablet; Refill: 1 - pantoprazole (PROTONIX) 40 MG tablet; Take 1 tablet (40 mg total) by mouth 2 (two) times daily. One hour before breakfast  Dispense: 60 tablet; Refill: 0  5. Dysphagia, unspecified type Same as #4   She does need to f/up with GI as well if not improving No difficulty with food passing/ie no obstruction or choking - sucralfate (CARAFATE) 1 g tablet; Take 1 tablet (1 g total) by mouth 3 (three) times daily with meals as needed. for GERD/reflux/epigastric pain when eating or drinking  Dispense: 30 tablet; Refill: 1 - pantoprazole (PROTONIX) 40 MG tablet; Take 1 tablet (40 mg total) by mouth 2 (two) times daily. One hour before breakfast  Dispense: 60 tablet; Refill: 0  6. Gastroesophageal reflux disease, unspecified whether esophagitis present For a few weeks to a month pt should try stronger PPI BID, pepcid and carafate prn and when sx improve wean off - pantoprazole (PROTONIX) 40 MG tablet; Take 1 tablet (40 mg total) by mouth 2 (two) times daily. One hour before breakfast  Dispense: 60 tablet; Refill: 0 - famotidine (PEPCID) 20 MG tablet; Take 2 tablets (40 mg total) by mouth 2 (two) times daily as needed for heartburn or indigestion. For reflux and nausea  in am's  Dispense: 60 tablet; Refill: 1  7. Sore throat Mild OP injection, strep negative - POCT rapid strep A       Danelle Berry, PA-C 07/04/23 11:31 AM

## 2023-07-06 ENCOUNTER — Encounter: Payer: Self-pay | Admitting: Family Medicine

## 2023-07-06 NOTE — Patient Instructions (Signed)
To help inflammation in upper GI system take protonix/pantoprazole 40 mg twice a day for at least 2 to 4 weeks.  If you have inflammation or ulcers it should help heal and improve Carafate is to coat your mucosa and help limit the pain you are experiencing when eating and swallowing, hopefully the pantoprazole will improve the inflammation and pain and he will be able to use Carafate only for a week or 2.  Discontinue the Carafate when you are no longer having pain with swallowing I recommend using Zofran and Pepcid for nausea and indigestion.  You may also stop usingand as if symptoms improve  Strongly recommend you follow-up with your GI specialist you may need an upper endoscopy. If you continue to have inability to swallow solid foods or if you have any increased or worsening pain we do need to get the CT of the abdomen done or possibly other imaging or tests.  We wouldl ike to recheck you in 2-4 weeks if not improving

## 2023-07-10 DIAGNOSIS — M3501 Sicca syndrome with keratoconjunctivitis: Secondary | ICD-10-CM | POA: Diagnosis not present

## 2023-07-11 ENCOUNTER — Other Ambulatory Visit: Payer: Self-pay | Admitting: Family Medicine

## 2023-07-11 DIAGNOSIS — R112 Nausea with vomiting, unspecified: Secondary | ICD-10-CM

## 2023-07-11 DIAGNOSIS — K219 Gastro-esophageal reflux disease without esophagitis: Secondary | ICD-10-CM

## 2023-07-19 ENCOUNTER — Encounter
Payer: Medicare Other | Attending: Physical Medicine and Rehabilitation | Admitting: Physical Medicine and Rehabilitation

## 2023-07-19 ENCOUNTER — Encounter: Payer: Self-pay | Admitting: Physical Medicine and Rehabilitation

## 2023-07-19 VITALS — BP 116/82 | HR 76 | Ht 63.0 in | Wt 179.0 lb

## 2023-07-19 DIAGNOSIS — M7918 Myalgia, other site: Secondary | ICD-10-CM | POA: Insufficient documentation

## 2023-07-19 DIAGNOSIS — M797 Fibromyalgia: Secondary | ICD-10-CM | POA: Diagnosis not present

## 2023-07-19 NOTE — Patient Instructions (Signed)
-   Resume Usual Activities. Notify Physician of any unusual bleeding, erythema or concern for side effects as reviewed above. - Apply ice prn for pain - Tylenol prn for pain - Message me in  2-3 weeks to assess response to injection

## 2023-07-19 NOTE — Progress Notes (Signed)
HPI: Yesenia Wood is a 37 y.o. female with PMHx has Deaf; B12 deficiency; Cochlear implant status; Fatty infiltration of liver; Gastro-esophageal reflux disease without esophagitis; Generalized headache; Right-sided Bell's palsy; Hypertriglyceridemia; Irregular menstrual cycle; Overweight; Paresthesia; Disorder of labyrinth; Palpitations; Sleep disturbance; Chronic migraine without aura; Dissocial personality disorder (HCC); OSA (obstructive sleep apnea); Insomnia due to mental condition; Muteness; MDD (major depressive disorder), recurrent episode, moderate (HCC); Social anxiety disorder; Allergic rhinitis; Bartholin gland cyst; Migraine; Obesity (BMI 30-39.9); RLS (restless legs syndrome); Right sided numbness; Sleep difficulties; Positive ANA (antinuclear antibody); Fibromyalgia; Dyslipidemia; Vitamin D deficiency; Nausea; Chronic pain syndrome; Sprain of left ankle; and Acute right ankle pain on their problem list. who presents to clinic for treatment of pain related to fibromyalgia/myofascial pain  via injection as described below.    No new concerns or complaints. No major changes in medical history since last visit.   Physical Exam:  General: Appropriate appearance for age.  Mental Status: Appropriate mood and affect.  Cardiovascular: RRR, no m/r/g.  Respiratory: CTAB, no rales/rhonchi/wheezing.  Skin: No apparent rashes or lesions.  Neuro: Awake, alert, and oriented x3. No apparent deficits.  MSK: Moving all 4 limbs antigravity and against resistance.  + TTP bilateral cervical paraspinals, trapezius, levator scapulae, rhomboid and lumbar paraspinal muscles  PROCEDURE:  Bilateral  trigger point injections Diagnosis:    ICD-10-CM   1. Myofascial pain  M79.18     2. Fibromyalgia  M79.7       Goals with treatment: [ x ] Decrease pain [  ] Improve Active / Passive ROM [ x ] Improve ADLs [  ] Improve functional mobility  MEDICATION:  [ X ] Lidocaine 1%    CONSENT: Obtained in  writing per policy. Consent uploaded to chart.  Benefits discussed.  Risks discussed included, but were not limited to, pain and discomfort, bleeding, bruising, allergic reaction, infection. All questions answered to patient/family member/guardian/ caregiver satisfaction. They would like to proceed with procedure. There are no noted contraindications to procedure.  PROCEDURE Time out was preformed No heat sources No antibiotics  The patient was explained about both the benefits and risks of a Bilateral  trigger point injections. After the patient acknowledged an understanding of the risks and benefits, the patient agreed to proceed. The area was first marked and then prepped in an aseptic fashion with betadine / alcohol. A 30 g, 0.5 inch needle was directed via a direct approach into the Bilateral cervical paraspinals, trapezius, levator scapulae, rhomboid and lumbar paraspinal muscles. The injection was completed with Kenalog 40 mg/ml 0.2 cc mixed with 6 cc of 1% lidocaine after no blood was aspirated on pull back.  No complications were encountered. The patient tolerated the procedure well.  Impression: HPI: Yesenia Wood is a 37 y.o. female with PMHx has Deaf; B12 deficiency; Cochlear implant status; Fatty infiltration of liver; Gastro-esophageal reflux disease without esophagitis; Generalized headache; Right-sided Bell's palsy; Hypertriglyceridemia; Irregular menstrual cycle; Overweight; Paresthesia; Disorder of labyrinth; Palpitations; Sleep disturbance; Chronic migraine without aura; Dissocial personality disorder (HCC); OSA (obstructive sleep apnea); Insomnia due to mental condition; Muteness; MDD (major depressive disorder), recurrent episode, moderate (HCC); Social anxiety disorder; Allergic rhinitis; Bartholin gland cyst; Migraine; Obesity (BMI 30-39.9); RLS (restless legs syndrome); Right sided numbness; Sleep difficulties; Positive ANA (antinuclear antibody); Fibromyalgia; Dyslipidemia;  Vitamin D deficiency; Nausea; Chronic pain syndrome; Sprain of left ankle; and Acute right ankle pain on their problem list. who presents to clinic for treatment of myofascial/FM pain . They received a  Bilateral  trigger point injections as above.   PLAN: - Resume Usual Activities. Notify Physician of any unusual bleeding, erythema or concern for side effects as reviewed above. - Apply ice prn for pain - Tylenol prn for pain - Message me in  2-3 weeks to assess response to injection   Patient/Care Giver was ready to learn without apparent learning barriers. Education was provided on diagnosis, treatment options/plan according to patient's preferred learning style. Patient/Care Giver verbalized understanding and agreement with the above plan.   Angelina Sheriff, DO 07/19/2023

## 2023-07-20 NOTE — Progress Notes (Signed)
Name: Yesenia Wood   MRN: [id number]    DOB: October 23, 1986   Date:07/21/2023       Progress Note  Subjective  Chief Complaint  Follow up  HPI  GERD/Dysphagia/Fatty liver: symptoms started after visit to [facility] for acute gastroenteritis. She was seen by Danelle Berry and had US liver that showed fatty liver, labs were normal, she was given sucralfate and symptoms have improved slightly but still has sore throat and dysphagia to solids, some epigastric pain. Vomit has resolved still has some intermittent nausea and takes zofran prn. She has an appointment scheduled with GI in 2 weeks. Continue current regiment   Patient Active Problem List   Diagnosis Date Noted   Acute right ankle pain 06/14/2023   Sprain of left ankle 05/01/2023   Chronic pain syndrome 03/03/2023   Nausea 08/12/2022   Dyslipidemia 02/10/2022   Vitamin D deficiency 02/10/2022   Positive ANA (antinuclear antibody) 12/20/2021   Fibromyalgia 12/20/2021   Sleep difficulties 04/30/2020   Right sided numbness 12/26/2019   RLS (restless legs syndrome) 09/30/2019   MDD (major depressive disorder), recurrent episode, moderate (HCC) 07/17/2019   Social anxiety disorder 07/17/2019   Muteness 09/10/2018   OSA (obstructive sleep apnea) 10/10/2016   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   Dissocial personality disorder (HCC) 06/07/2016   Sleep disturbance 05/10/2016   Chronic migraine without aura 03/29/2016   Palpitations 06/09/2015   Deaf 05/16/2015   B12 deficiency 05/16/2015   Cochlear implant status 05/16/2015   Fatty infiltration of liver 05/16/2015   Gastro-esophageal reflux disease without esophagitis 05/16/2015   Generalized headache 05/16/2015   Right-sided Bell's palsy 05/16/2015   Hypertriglyceridemia 05/16/2015   Irregular menstrual cycle 05/16/2015   Overweight 05/16/2015   Paresthesia 05/16/2015   Disorder of labyrinth 05/16/2015   Migraine 08/05/2013   Allergic rhinitis 07/01/2013   Obesity (BMI  30-39.9) 07/01/2013   Bartholin gland cyst 09/16/2008    Past Surgical History:  Procedure Laterality Date   BARTHOLIN GLAND CYST REMOVAL     COCHLEAR IMPLANT     COCHLEAR IMPLANT REMOVAL     DILATATION AND CURETTAGE/HYSTEROSCOPY WITH MINERVA N/A 03/05/2021   Procedure: DILATATION AND CURETTAGE/HYSTEROSCOPY WITH MINERVA;  Surgeon: Hildred Laser, MD;  Location: [facility] ORS;  Service: Gynecology;  Laterality: N/A;   LAPAROSCOPIC TUBAL LIGATION Bilateral 03/26/2018   Procedure: LAPAROSCOPIC TUBAL LIGATION;  Surgeon: Hildred Laser, MD;  Location: [facility] OR[facility];  Service: Gynecology;  Laterality: Bilateral;  with peritoneal biopsies   LAPAROSCOPY     MYRINGOTOMY WITH TUBE PLACEMENT     TUBAL LIGATION  03/26/2018    Family History  Adopted: Yes  Family history unknown: Yes    Social History   Tobacco Use   Smoking status: Former    Current packs/day: 0.00    Average packs/day: 1 pack/day for 2.0 years (2.0 ttl pk-yrs)    Types: Cigarettes    Start date: 11/28/2004    Quit date: 11/28/2006    Years since quitting: 16.6   Smokeless tobacco: Never  Substance Use Topics   Alcohol use: Yes    Comment: 1 every 3 months     Current Outpatient Medications:    BOTOX 100 units SOLR injection, Inject 100 Units into the muscle every 3 (three) months., Disp: , Rfl:    Cholecalciferol (VITAMIN D) 50 MCG (2000 UT) CAPS, Take 1 capsule by mouth daily., Disp: , Rfl:    Cyanocobalamin (VITAMIN B-12) 500 MCG SUBL, Place 1 tablet (500 mcg total) under the  tongue once a week., Disp: 30 tablet, Rfl: 0   doxylamine, Sleep, (UNISOM) 25 MG tablet, Take 25 mg by mouth at bedtime as needed., Disp: , Rfl:    Elagolix Sodium (ORILISSA) 200 MG TABS, Take 1 tablet (200 mg total) by mouth in the morning and at bedtime., Disp: 60 tablet, Rfl: 5   famotidine (PEPCID) 20 MG tablet, Take 2 tablets (40 mg total) by mouth 2 (two) times daily as needed for heartburn or indigestion. For reflux and nausea  in am's, Disp: 60 tablet,  Rfl: 1   ferrous sulfate 325 (65 FE) MG tablet, Take 325 mg by mouth daily with breakfast., Disp: , Rfl:    folic acid (FOLVITE) 800 MCG tablet, Take 800 mcg by mouth daily. 800 mcg, Disp: , Rfl:    metoprolol succinate (TOPROL-XL) 50 MG 24 hr tablet, Take 1 tablet (50 mg total) by mouth daily., Disp: 90 tablet, Rfl: 3   naproxen (NAPROSYN) 500 MG tablet, Take 1 tablet (500 mg total) by mouth daily as needed (take with benadryll)., Disp: 90 tablet, Rfl: 3   norethindrone (AYGESTIN) 5 MG tablet, Take 1 tablet (5 mg total) by mouth daily., Disp: 90 tablet, Rfl: 3   omeprazole (PRILOSEC) 40 MG capsule, Take 1 capsule (40 mg total) by mouth at bedtime., Disp: 30 capsule, Rfl: 3   pantoprazole (PROTONIX) 40 MG tablet, Take 1 tablet (40 mg total) by mouth 2 (two) times daily. One hour before breakfast, Disp: 60 tablet, Rfl: 0   polyethylene glycol powder (GLYCOLAX/MIRALAX) 17 GM/SCOOP powder, Take 17 g by mouth daily., Disp: 3350 g, Rfl: 1   rosuvastatin (CRESTOR) 20 MG tablet, Take 1 tablet (20 mg total) by mouth daily., Disp: 90 tablet, Rfl: 1   sucralfate (CARAFATE) 1 g tablet, Take 1 tablet (1 g total) by mouth 3 (three) times daily with meals as needed. for GERD/reflux/epigastric pain when eating or drinking, Disp: 30 tablet, Rfl: 1   dicyclomine (BENTYL) 10 MG capsule, Take 1 capsule (10 mg total) by mouth 3 (three) times daily as needed for spasms. (Patient not taking: Reported on 07/19/2023), Disp: 90 capsule, Rfl: 3   FIBER ADULT GUMMIES PO, Take 15 mg by mouth daily. (Patient not taking: Reported on 07/19/2023), Disp: , Rfl:    loratadine (CLARITIN) 10 MG tablet, TAKE 1 TABLET BY MOUTH EVERY DAY (Patient not taking: Reported on 07/19/2023), Disp: 90 tablet, Rfl: 1   NON FORMULARY, CBD soft gel as needed. (Patient not taking: Reported on 07/19/2023), Disp: , Rfl:    ondansetron (ZOFRAN-ODT) 4 MG disintegrating tablet, Take 1-2 tablets (4-8 mg total) by mouth every 8 (eight) hours as needed for nausea  or vomiting. (Patient not taking: Reported on 07/21/2023), Disp: 20 tablet, Rfl: 1  Allergies  Allergen Reactions   Budesonide-Formoterol Fumarate Anaphylaxis and Other (See Comments)    Other reaction(s): Other (See Comments) chest pain Chest pain chest pain   Astelin [Azelastine]     Tingling, numbness, nausea   Gabapentin Nausea And Vomiting   Nortriptyline Other (See Comments)    Other reaction(s): Other (See Comments)   Fluogen [Influenza Virus Vaccine]     rash   Lamictal [Lamotrigine]     Self injurious thoughts   Milnacipran Other (See Comments)    Pressure in eyes   Tegaderm Ag Mesh [Silver] Itching   Diclofenac Other (See Comments)    Other reaction(s): Other (See Comments)   Duloxetine Hcl Other (See Comments)    Other reaction(s): Other (See Comments)  Other reaction(s): unknown   Montelukast Sodium Other (See Comments)    Other reaction(s): wheezing/sob   Mupirocin Other (See Comments)   Naproxen Other (See Comments) and Rash    Other reaction(s): rash/itching   Naproxen Sodium Rash    mild rash   Other Rash   Oxycodone Hcl Rash   Sulfa Antibiotics Hives, Rash and Itching    I personally reviewed active problem list, medication list, allergies with the patient/caregiver today.   ROS  Ten systems reviewed and is negative except as mentioned in HPI    Objective  Vitals:   07/21/23 1403  BP: 90/82  Pulse: 79  Resp: 14  Temp: 98 F (36.7 C)  TempSrc: Oral  SpO2: 98%  Weight: 179 lb 4.8 oz (81.3 kg)  Height: 5\' 3"  (1.6 m)    Body mass index is 31.76 kg/m.  Physical Exam  Constitutional: Patient appears well-developed and well-nourished. Obese  No distress.  HEENT: head atraumatic, normocephalic, pupils equal and reactive to light, neck supple, throat within normal limits Cardiovascular: Normal rate, regular rhythm and normal heart sounds.  No murmur heard. No BLE edema. Pulmonary/Chest: Effort normal and breath sounds normal. No respiratory  distress. Abdominal: Soft.  There is mild epigastric tenderness. Psychiatric: Patient has a normal mood and affect. behavior is normal. Judgment and thought content normal.   Recent Results (from the past 2160 hour(s))  CBC with Differential/Platelet     Status: None   Collection Time: 05/25/23 12:16 PM  Result Value Ref Range   WBC 6.9 3.8 - 10.8 Thousand/uL   RBC 4.79 3.80 - 5.10 Million/uL   Hemoglobin 13.9 11.7 - 15.5 g/dL   HCT 40.9 81.1 - 91.4 %   MCV 88.1 80.0 - 100.0 fL   MCH 29.0 27.0 - 33.0 pg   MCHC 32.9 32.0 - 36.0 g/dL   RDW 78.2 95.6 - 21.3 %   Platelets 317 140 - 400 Thousand/uL   MPV 9.6 7.5 - 12.5 fL   Neutro Abs 3,974 1,500 - 7,800 cells/uL   Lymphs Abs 2,132 850 - 3,900 cells/uL   Absolute Monocytes 697 200 - 950 cells/uL   Eosinophils Absolute 48 15 - 500 cells/uL   Basophils Absolute 48 0 - 200 cells/uL   Neutrophils Relative % 57.6 %   Total Lymphocyte 30.9 %   Monocytes Relative 10.1 %   Eosinophils Relative 0.7 %   Basophils Relative 0.7 %  COMPLETE METABOLIC PANEL WITH GFR     Status: Abnormal   Collection Time: 05/25/23 12:16 PM  Result Value Ref Range   Glucose, Bld 90 65 - 99 mg/dL    Comment: .            Fasting reference interval .    BUN 11 7 - 25 mg/dL   Creat 0.86 5.78 - 4.69 mg/dL   eGFR 629 > OR = 60 BM/WUX/3.24M0   BUN/Creatinine Ratio SEE NOTE: 6 - 22 (calc)    Comment:    Not Reported: BUN and Creatinine are within    reference range. .    Sodium 140 135 - 146 mmol/L   Potassium 4.1 3.5 - 5.3 mmol/L   Chloride 103 98 - 110 mmol/L   CO2 29 20 - 32 mmol/L   Calcium 9.6 8.6 - 10.2 mg/dL   Total Protein 7.3 6.1 - 8.1 g/dL   Albumin 4.6 3.6 - 5.1 g/dL   Globulin 2.7 1.9 - 3.7 g/dL (calc)   AG Ratio 1.7 1.0 -  2.5 (calc)   Total Bilirubin 0.3 0.2 - 1.2 mg/dL   Alkaline phosphatase (APISO) 65 31 - 125 U/L   AST 21 10 - 30 U/L   ALT 38 (H) 6 - 29 U/L  POCT rapid strep A     Status: None   Collection Time: 07/04/23 11:31 AM   Result Value Ref Range   Rapid Strep A Screen Negative Negative  CBC with Differential/Platelet     Status: None   Collection Time: 07/04/23 12:17 PM  Result Value Ref Range   WBC 7.2 3.8 - 10.8 Thousand/uL   RBC 4.82 3.80 - 5.10 Million/uL   Hemoglobin 13.6 11.7 - 15.5 g/dL   HCT 40.9 81.1 - 91.4 %   MCV 86.9 80.0 - 100.0 fL   MCH 28.2 27.0 - 33.0 pg   MCHC 32.5 32.0 - 36.0 g/dL   RDW 78.2 95.6 - 21.3 %   Platelets 306 140 - 400 Thousand/uL   MPV 10.4 7.5 - 12.5 fL   Neutro Abs 3,830 1,500 - 7,800 cells/uL   Lymphs Abs 2,707 850 - 3,900 cells/uL   Absolute Monocytes 590 200 - 950 cells/uL   Eosinophils Absolute 29 15 - 500 cells/uL   Basophils Absolute 43 0 - 200 cells/uL   Neutrophils Relative % 53.2 %   Total Lymphocyte 37.6 %   Monocytes Relative 8.2 %   Eosinophils Relative 0.4 %   Basophils Relative 0.6 %  COMPLETE METABOLIC PANEL WITH GFR     Status: Abnormal   Collection Time: 07/04/23 12:17 PM  Result Value Ref Range   Glucose, Bld 83 65 - 99 mg/dL    Comment: .            Fasting reference interval .    BUN 7 7 - 25 mg/dL   Creat 0.86 5.78 - 4.69 mg/dL   eGFR 97 > OR = 60 GE/XBM/8.41L2   BUN/Creatinine Ratio SEE NOTE: 6 - 22 (calc)    Comment:    Not Reported: BUN and Creatinine are within    reference range. .    Sodium 141 135 - 146 mmol/L   Potassium 4.2 3.5 - 5.3 mmol/L   Chloride 102 98 - 110 mmol/L   CO2 29 20 - 32 mmol/L   Calcium 9.9 8.6 - 10.2 mg/dL   Total Protein 7.1 6.1 - 8.1 g/dL   Albumin 4.7 3.6 - 5.1 g/dL   Globulin 2.4 1.9 - 3.7 g/dL (calc)   AG Ratio 2.0 1.0 - 2.5 (calc)   Total Bilirubin 0.4 0.2 - 1.2 mg/dL   Alkaline phosphatase (APISO) 60 31 - 125 U/L   AST 26 10 - 30 U/L   ALT 35 (H) 6 - 29 U/L  Lipase     Status: None   Collection Time: 07/04/23 12:17 PM  Result Value Ref Range   Lipase 13 7 - 60 U/L     PHQ2/9:    07/21/2023    2:06 PM 07/19/2023   10:47 AM 07/04/2023   11:13 AM 06/30/2023    3:45 PM 06/16/2023   10:22  AM  Depression screen PHQ 2/9  Decreased Interest 0 0 0 0 1  Down, Depressed, Hopeless 0 0 0 0 1  PHQ - 2 Score 0 0 0 0 2  Altered sleeping 0  0    Tired, decreased energy 0  0    Change in appetite 0  0    Feeling bad or failure about yourself  0  0    Trouble concentrating 0  0    Moving slowly or fidgety/restless 0  0    Suicidal thoughts 0  0    PHQ-9 Score 0  0    Difficult doing work/chores   Not difficult at all      phq 9 is negative   Fall Risk:    07/21/2023    2:05 PM 07/19/2023   10:47 AM 07/04/2023   11:13 AM 06/30/2023    3:36 PM 06/16/2023   10:22 AM  Fall Risk   Falls in the past year? 0 0 1 1 0  Number falls in past yr:  0 1 1 0  Injury with Fall?  0 1 0 0  Risk for fall due to : No Fall Risks  Impaired balance/gait No Fall Risks   Follow up Falls prevention discussed  Falls prevention discussed;Education provided;Falls evaluation completed Education provided;Falls prevention discussed      Functional Status Survey: Is the patient deaf or have difficulty hearing?: No Does the patient have difficulty seeing, even when wearing glasses/contacts?: No Does the patient have difficulty concentrating, remembering, or making decisions?: Yes Does the patient have difficulty walking or climbing stairs?: No Does the patient have difficulty dressing or bathing?: No Does the patient have difficulty doing errands alone such as visiting a doctor's office or shopping?: No    Assessment & Plan  1. Fatty infiltration of liver  On [place]   2. Dysphagia, unspecified type  Keep follow up with GI  3. Gastroesophageal reflux disease, unspecified whether esophagitis present   Continue medication

## 2023-07-21 ENCOUNTER — Ambulatory Visit (INDEPENDENT_AMBULATORY_CARE_PROVIDER_SITE_OTHER): Payer: Medicare Other | Admitting: Family Medicine

## 2023-07-21 ENCOUNTER — Encounter: Payer: Self-pay | Admitting: Family Medicine

## 2023-07-21 VITALS — BP 90/82 | HR 79 | Temp 98.0°F | Resp 14 | Ht 63.0 in | Wt 179.3 lb

## 2023-07-21 DIAGNOSIS — K760 Fatty (change of) liver, not elsewhere classified: Secondary | ICD-10-CM | POA: Diagnosis not present

## 2023-07-21 DIAGNOSIS — R1310 Dysphagia, unspecified: Secondary | ICD-10-CM

## 2023-07-21 DIAGNOSIS — K219 Gastro-esophageal reflux disease without esophagitis: Secondary | ICD-10-CM | POA: Diagnosis not present

## 2023-07-26 ENCOUNTER — Other Ambulatory Visit: Payer: Self-pay | Admitting: Family Medicine

## 2023-07-26 DIAGNOSIS — R079 Chest pain, unspecified: Secondary | ICD-10-CM

## 2023-07-26 DIAGNOSIS — R112 Nausea with vomiting, unspecified: Secondary | ICD-10-CM

## 2023-07-26 DIAGNOSIS — K219 Gastro-esophageal reflux disease without esophagitis: Secondary | ICD-10-CM

## 2023-07-26 DIAGNOSIS — R1310 Dysphagia, unspecified: Secondary | ICD-10-CM

## 2023-07-26 DIAGNOSIS — K529 Noninfective gastroenteritis and colitis, unspecified: Secondary | ICD-10-CM

## 2023-07-26 MED ORDER — LIDOCAINE HCL 1 % IJ SOLN
6.0000 mL | Freq: Once | INTRAMUSCULAR | Status: DC
Start: 2023-07-26 — End: 2024-02-12

## 2023-07-29 DIAGNOSIS — Z23 Encounter for immunization: Secondary | ICD-10-CM | POA: Diagnosis not present

## 2023-08-01 ENCOUNTER — Ambulatory Visit (INDEPENDENT_AMBULATORY_CARE_PROVIDER_SITE_OTHER): Payer: Medicare Other | Admitting: Physician Assistant

## 2023-08-01 ENCOUNTER — Encounter: Payer: Self-pay | Admitting: Physician Assistant

## 2023-08-01 VITALS — BP 109/75 | HR 81 | Temp 98.3°F | Ht 63.0 in | Wt 177.1 lb

## 2023-08-01 DIAGNOSIS — K219 Gastro-esophageal reflux disease without esophagitis: Secondary | ICD-10-CM

## 2023-08-01 DIAGNOSIS — R197 Diarrhea, unspecified: Secondary | ICD-10-CM | POA: Diagnosis not present

## 2023-08-01 DIAGNOSIS — R112 Nausea with vomiting, unspecified: Secondary | ICD-10-CM | POA: Diagnosis not present

## 2023-08-01 DIAGNOSIS — R1310 Dysphagia, unspecified: Secondary | ICD-10-CM | POA: Diagnosis not present

## 2023-08-01 DIAGNOSIS — K581 Irritable bowel syndrome with constipation: Secondary | ICD-10-CM | POA: Diagnosis not present

## 2023-08-01 DIAGNOSIS — R110 Nausea: Secondary | ICD-10-CM

## 2023-08-01 DIAGNOSIS — K5900 Constipation, unspecified: Secondary | ICD-10-CM | POA: Diagnosis not present

## 2023-08-01 DIAGNOSIS — K529 Noninfective gastroenteritis and colitis, unspecified: Secondary | ICD-10-CM

## 2023-08-01 DIAGNOSIS — R079 Chest pain, unspecified: Secondary | ICD-10-CM

## 2023-08-01 MED ORDER — FAMOTIDINE 40 MG PO TABS: 40.0000 mg | ORAL_TABLET | Freq: Every day | ORAL | 5 refills | Status: DC

## 2023-08-01 MED ORDER — PANTOPRAZOLE SODIUM 40 MG PO TBEC
40.0000 mg | DELAYED_RELEASE_TABLET | Freq: Every day | ORAL | 3 refills | Status: DC
Start: 2023-08-01 — End: 2023-10-31

## 2023-08-01 MED ORDER — LINACLOTIDE 145 MCG PO CAPS: 145.0000 ug | ORAL_CAPSULE | Freq: Every day | ORAL | Status: DC

## 2023-08-01 NOTE — Patient Instructions (Addendum)
For Constipaiton: Gave samples of Linzess 72 and Lizess 145. Let us know which strength work better for you.  Stop Miralax.  2.  Continue Pantoprazole 40mg  1 tablet once daily before breakfast for GERD.  3.  Start Famotidine 40mg  1 tablet once daily before dinner or before bed for GERD.

## 2023-08-01 NOTE — Progress Notes (Signed)
Celso Amy, PA-C [address]  [address]  [place], [place] [address]  Main: [phone]  Fax: [phone]   Primary Care Physician: Alba Cory, MD  Primary Gastroenterologist:  Celso Amy, PA-C   CC: Follow-up GERD and IBS-C  HPI: Yesenia Wood is a 37 y.o. female returns for 56-month follow-up of lower abdominal pain, Constipation, and acid reflux.  We are using sign language interpreter Francis Dowse [phone].  In the past 2 months she tried dicyclomine 10 mg 1 tablet 3 times daily, low FODMAP diet, and Pantoprazole 40mg  once daily.  She is not taking famotidine.  Pantoprazole is working better than Omeprazole, however she still has breakthrough heartburn.  She takes Miralax but still has some constipation.  She does not like taking Miralax.  Low FODMAP diet has helped her bloating.  GI symptoms have improved 50%.  She needs refill of Zofran for nausea.  Lab 07/04/2023 showed normal CBC, CMP, and lipase.  ALT was slightly elevated at 35.  Hx chronically elevated ALT due to fatty liver.  Complete abdominal ultrasound 07/04/2023 showed hepatic steatosis, otherwise normal.  No gallstones.  Abdominal x-ray 06/06/2023 showed prominent gas in the colon, otherwise normal.  Small amount of stool throughout the colon.  No bowel obstruction.    She had pelvic ultrasound through her GYN.  Saw GI 10 years ago in [place] and had a normal colonoscopy.   Current Outpatient Medications  Medication Sig Dispense Refill   BOTOX 100 units SOLR injection Inject 100 Units into the muscle every 3 (three) months.     Cholecalciferol (VITAMIN D) 50 MCG (2000 UT) CAPS Take 1 capsule by mouth daily.     Cyanocobalamin (VITAMIN B-12) 500 MCG SUBL Place 1 tablet (500 mcg total) under the tongue once a week. 30 tablet 0   dicyclomine (BENTYL) 10 MG capsule Take 1 capsule (10 mg total) by mouth 3 (three) times daily as needed for spasms. 90 capsule 3   Elagolix Sodium (ORILISSA) 200 MG TABS Take 1 tablet  (200 mg total) by mouth in the morning and at bedtime. 60 tablet 5   famotidine (PEPCID) 40 MG tablet Take 1 tablet (40 mg total) by mouth at bedtime. 30 tablet 5   ferrous sulfate 325 (65 FE) MG tablet Take 325 mg by mouth daily with breakfast.     folic acid (FOLVITE) 800 MCG tablet Take 800 mcg by mouth daily. 800 mcg     linaclotide (LINZESS) 145 MCG CAPS capsule Take 1 capsule (145 mcg total) by mouth daily before breakfast for 8 days.     metoprolol succinate (TOPROL-XL) 50 MG 24 hr tablet Take 1 tablet (50 mg total) by mouth daily. 90 tablet 3   naproxen (NAPROSYN) 500 MG tablet Take 1 tablet (500 mg total) by mouth daily as needed (take with benadryll). 90 tablet 3   norethindrone (AYGESTIN) 5 MG tablet Take 1 tablet (5 mg total) by mouth daily. 90 tablet 3   ondansetron (ZOFRAN-ODT) 4 MG disintegrating tablet Take 1-2 tablets (4-8 mg total) by mouth every 8 (eight) hours as needed for nausea or vomiting. 20 tablet 1   rosuvastatin (CRESTOR) 20 MG tablet Take 1 tablet (20 mg total) by mouth daily. 90 tablet 1   sucralfate (CARAFATE) 1 g tablet Take 1 tablet (1 g total) by mouth 3 (three) times daily with meals as needed. for GERD/reflux/epigastric pain when eating or drinking 30 tablet 1   doxylamine, Sleep, (UNISOM) 25 MG tablet Take 25  mg by mouth at bedtime as needed. (Patient not taking: Reported on 08/01/2023)     FIBER ADULT GUMMIES PO Take 15 mg by mouth daily. (Patient not taking: Reported on 07/19/2023)     loratadine (CLARITIN) 10 MG tablet TAKE 1 TABLET BY MOUTH EVERY DAY (Patient not taking: Reported on 07/19/2023) 90 tablet 1   NON FORMULARY CBD soft gel as needed. (Patient not taking: Reported on 07/19/2023)     pantoprazole (PROTONIX) 40 MG tablet Take 1 tablet (40 mg total) by mouth daily. No refills - needs f/up with PCP or GI 90 tablet 3   Current Facility-Administered Medications  Medication Dose Route Frequency Provider Last Rate Last Admin   lidocaine (XYLOCAINE) 1 % (with  pres) injection 6 mL  6 mL Other Once         Allergies as of 08/01/2023 - Review Complete 08/01/2023  Allergen Reaction Noted   Budesonide-formoterol fumarate Anaphylaxis and Other (See Comments) 08/19/2013   Astelin [azelastine]  10/12/2017   Gabapentin Nausea And Vomiting 11/12/2014   Nortriptyline Other (See Comments) 06/23/2015   Fluogen [influenza virus vaccine]  08/19/2022   Lamictal [lamotrigine]  09/15/2020   Milnacipran Other (See Comments) 04/24/2014   Tegaderm ag mesh [silver] Itching 09/25/2019   Diclofenac Other (See Comments) 08/19/2013   Duloxetine hcl Other (See Comments) 08/19/2013   Montelukast sodium Other (See Comments) 08/19/2013   Mupirocin Other (See Comments) 10/06/2014   Naproxen Other (See Comments) and Rash 11/06/2007   Naproxen sodium Rash 04/23/2015   Other Rash 02/06/2007   Oxycodone hcl Rash 04/04/2018   Sulfa antibiotics Hives, Rash, and Itching 02/06/2007    Past Medical History:  Diagnosis Date   ADHD (attention deficit hyperactivity disorder)    B12 deficiency    Bell's palsy    right sided   Chest pain    a. 05/2015 ETT: elev HR/BP, but no acute ST/T changes; b. 09/2019 Cor CTA: Nl cors, Ca2+ = 0.   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   Fatigue    Fatty liver    Fibromyalgia    GERD (gastroesophageal reflux disease)    Headache    High triglycerides    Hyperglycemia    Hypertension    Hyperthyroidism    Moderate asthma    Obesity    Paresthesia    Premature birth     Past Surgical History:  Procedure Laterality Date   BARTHOLIN GLAND CYST REMOVAL     COCHLEAR IMPLANT     COCHLEAR IMPLANT REMOVAL     DILATATION AND CURETTAGE/HYSTEROSCOPY WITH MINERVA N/A 03/05/2021   Procedure: DILATATION AND CURETTAGE/HYSTEROSCOPY WITH MINERVA;  Surgeon: Hildred Laser, MD;  Location: [facility];  Service: Gynecology;  Laterality: N/A;   LAPAROSCOPIC  TUBAL LIGATION Bilateral 03/26/2018   Procedure: LAPAROSCOPIC TUBAL LIGATION;  Surgeon: Hildred Laser, MD;  Location: [facility];  Service: Gynecology;  Laterality: Bilateral;  with peritoneal biopsies   LAPAROSCOPY     MYRINGOTOMY WITH TUBE PLACEMENT     TUBAL LIGATION  03/26/2018    Review of Systems:    All systems reviewed and negative except where noted in HPI.   Physical Examination:   BP 109/75 (BP Location: Right Arm, Patient Position: Sitting, Cuff Size: Normal)   Pulse 81   Temp 98.3 F (36.8 C) (Oral)   Ht 5\' 3"  (1.6 m)   Wt 177 lb 2  oz (80.3 kg)   BMI 31.38 kg/m   General: Well-nourished, well-developed in no acute distress.  Neuro: Alert and oriented x 3.  Grossly intact.  Psych: Alert and cooperative, normal mood and affect. No Exam performed.  Imaging Studies: US Abdomen Complete  Result Date: 07/04/2023 CLINICAL DATA:  Generalized abdomen pain. EXAM: ABDOMEN ULTRASOUND COMPLETE COMPARISON:  May 10th 2016 FINDINGS: Gallbladder: No gallstones or wall thickening visualized. No sonographic Murphy sign noted by sonographer. Common bile duct: Diameter: 3 mm Liver: Increased echotexture. No focal lesion identified. Portal vein is patent on color Doppler imaging with normal direction of blood flow towards the liver. IVC: No abnormality visualized. Pancreas: Ultrasound technologist reports not well visualized. Spleen: Size and appearance within normal limits. Right Kidney: Length: Implant 9 cm. Echogenicity within normal limits. No mass or hydronephrosis visualized. Left Kidney: Length: 11 cm. Echogenicity within normal limits. No mass or hydronephrosis visualized. Abdominal aorta: No aneurysm visualized. Other findings: None. IMPRESSION: 1. No acute abnormality identified. 2. Increased echotexture of the liver. This is a nonspecific finding but often seen with hepatic steatosis. Electronically Signed   By: Sherian Rein M.D.   On: 07/04/2023 10:10    Assessment and Plan:   ARNESHA MOLLISON is a 37 y.o. y/o female returns for follow-up of GERD, constipation, and IBS.  Symptoms have improved 50% on treatment.  Recent ultrasound and abdominal x-ray and labs were unrevealing.  I am adjusting treatment.  She has no alarm symptoms.  We discussed scheduling an EGD and patient declined today.  1.  GERD  Continue pantoprazole 40 Mg 1 tablet once daily before breakfast.  Add prescription famotidine 40 mg 1 tablet once daily before dinner or bedtime.  Continue avoiding GERD trigger foods and drinks.  2.  Irritable bowel syndrome with chronic constipation and bloating  Continue low FODMAP diet  Stop MiraLAX  Start Linzess.  Gave samples of Linzess 72 mcg QD for 1 week, then 145 mcg QD for 1 week.  She will let me know which dose works best, and then she can request a prescription.    Celso Amy, PA-C  Follow up in 3 months with TG.

## 2023-08-02 ENCOUNTER — Encounter: Payer: Self-pay | Admitting: Cardiovascular Disease

## 2023-08-03 ENCOUNTER — Encounter: Payer: Self-pay | Admitting: Obstetrics and Gynecology

## 2023-08-03 MED ORDER — ORILISSA 150 MG PO TABS: 1.0000 | ORAL_TABLET | Freq: Every day | ORAL | 11 refills | Status: DC

## 2023-08-08 ENCOUNTER — Encounter: Payer: Self-pay | Admitting: Physical Medicine and Rehabilitation

## 2023-08-08 ENCOUNTER — Encounter: Payer: Self-pay | Admitting: Physical Medicine & Rehabilitation

## 2023-08-08 ENCOUNTER — Encounter: Payer: Medicare Other | Attending: Physical Medicine and Rehabilitation | Admitting: Physical Medicine & Rehabilitation

## 2023-08-08 VITALS — BP 106/75 | HR 83 | Ht 63.0 in | Wt 181.0 lb

## 2023-08-08 DIAGNOSIS — G894 Chronic pain syndrome: Secondary | ICD-10-CM | POA: Insufficient documentation

## 2023-08-08 DIAGNOSIS — G4700 Insomnia, unspecified: Secondary | ICD-10-CM | POA: Insufficient documentation

## 2023-08-08 DIAGNOSIS — M7918 Myalgia, other site: Secondary | ICD-10-CM | POA: Diagnosis not present

## 2023-08-08 DIAGNOSIS — G2581 Restless legs syndrome: Secondary | ICD-10-CM | POA: Diagnosis not present

## 2023-08-08 DIAGNOSIS — F4010 Social phobia, unspecified: Secondary | ICD-10-CM | POA: Diagnosis not present

## 2023-08-08 DIAGNOSIS — G510 Bell's palsy: Secondary | ICD-10-CM | POA: Diagnosis not present

## 2023-08-08 DIAGNOSIS — F609 Personality disorder, unspecified: Secondary | ICD-10-CM | POA: Diagnosis not present

## 2023-08-08 DIAGNOSIS — E559 Vitamin D deficiency, unspecified: Secondary | ICD-10-CM | POA: Diagnosis not present

## 2023-08-08 DIAGNOSIS — F331 Major depressive disorder, recurrent, moderate: Secondary | ICD-10-CM | POA: Diagnosis not present

## 2023-08-08 DIAGNOSIS — G4733 Obstructive sleep apnea (adult) (pediatric): Secondary | ICD-10-CM | POA: Diagnosis not present

## 2023-08-08 DIAGNOSIS — M797 Fibromyalgia: Secondary | ICD-10-CM | POA: Diagnosis not present

## 2023-08-08 DIAGNOSIS — H9190 Unspecified hearing loss, unspecified ear: Secondary | ICD-10-CM | POA: Insufficient documentation

## 2023-08-08 DIAGNOSIS — G43709 Chronic migraine without aura, not intractable, without status migrainosus: Secondary | ICD-10-CM | POA: Diagnosis not present

## 2023-08-08 NOTE — Progress Notes (Signed)
Acupuncture treatment #4 Indication fibromyalgia as well as chronic headaches.  Treatment today consisted of 30 mm sterile stainless steel needles placed at bilateral large intestine 4 as well as bilateral liver 3 electrical stimulation at 2 Hz x 25 minutes between the needles of right large intestine 4 right liver 3 as well as on the left side.  Patient tolerated procedure well postprocedure instructions given

## 2023-08-10 ENCOUNTER — Ambulatory Visit: Payer: Medicare Other | Attending: Physician Assistant | Admitting: Cardiovascular Disease

## 2023-08-10 ENCOUNTER — Encounter: Payer: Self-pay | Admitting: Cardiovascular Disease

## 2023-08-10 VITALS — BP 118/70 | HR 81 | Ht 63.0 in | Wt 179.5 lb

## 2023-08-10 DIAGNOSIS — E785 Hyperlipidemia, unspecified: Secondary | ICD-10-CM | POA: Insufficient documentation

## 2023-08-10 DIAGNOSIS — R000 Tachycardia, unspecified: Secondary | ICD-10-CM | POA: Diagnosis not present

## 2023-08-10 DIAGNOSIS — I4711 Inappropriate sinus tachycardia, so stated: Secondary | ICD-10-CM | POA: Diagnosis not present

## 2023-08-10 NOTE — Progress Notes (Signed)
Cardiology Office Note   Date:  08/10/2023   ID:  Yesenia Wood, DOB 04-18-1986, MRN [id number]  PCP:  Alba Cory, MD  Cardiologist:   Lorine Bears, MD   Chief Complaint  Patient presents with   Follow-up    Rapid heart beat c/o fluctuating BP and dizziness. Meds reviewed verbally with pt.      History of Present Illness: Yesenia Wood is a 37 y.o. female who presents for a follow-up visit regarding inappropriate sinus tachycardia.  This visit was facilitated with an interpreter given that the patient is deaf.  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  Prior echo in [place] 2015 was completely normal. She underwent treadmill stress testing 06/23/2015 that was normal. However, her HR and BP were elevated at peak exercise. 48-hour Holter monitor showed sinus tachycardia. She was initially treated with diltiazem but did not respond very well and ultimately switched to Toprol. She had previous cardiac CTA in November 2020 which showed a calcium score of 0 with no evidence of coronary artery disease.  She was seen in August 2023.  At that time she complained of increased palpitations mostly at night.  Previous sleep study in 2017 showed mild sleep apnea. She had an outpatient ZIO monitor in November of 2023 which showed normal sinus rhythm with rare premature beats.  Most triggered events did not correlate with arrhythmia.  Average heart rate was 75 bpm.  Echocardiogram was done in December which showed normal LV systolic function with mild mitral regurgitation and mild aortic sclerosis.  She was switched from acebutolol to Toprol with overall improvement and palpitations.  She was on Toprol 100 mg once daily and was feeling sluggish with inability to increase heart rate with exercise on that dose.  Thus, I decreased the dose to 50 mg once daily during last  visit.  Her exercise tolerance improved after that but she had some episodes of intermittent palpitations and tachycardia.  She had 1 incident where she was at the dentist office and she felt palpitations and increased heart rate after local anesthetic.  She is exercising on a regular basis.  Past Medical History:  Diagnosis Date   ADHD (attention deficit hyperactivity disorder)    B12 deficiency    Bell's palsy    right sided   Chest pain    a. 05/2015 ETT: elev HR/BP, but no acute ST/T changes; b. 09/2019 Cor CTA: Nl cors, Ca2+ = 0.   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   Fatigue    Fatty liver    Fibromyalgia    GERD (gastroesophageal reflux disease)    Headache    High triglycerides    Hyperglycemia    Hypertension    Hyperthyroidism    Moderate asthma    Obesity    Paresthesia    Premature birth     Past Surgical History:  Procedure Laterality Date   BARTHOLIN GLAND CYST REMOVAL     COCHLEAR IMPLANT     COCHLEAR IMPLANT REMOVAL     DILATATION AND CURETTAGE/HYSTEROSCOPY WITH MINERVA N/A 03/05/2021   Procedure: DILATATION AND CURETTAGE/HYSTEROSCOPY WITH MINERVA;  Surgeon: Hildred Laser, MD;  Location: [facility];  Service: Gynecology;  Laterality: N/A;   LAPAROSCOPIC TUBAL LIGATION Bilateral 03/26/2018   Procedure: LAPAROSCOPIC TUBAL  LIGATION;  Surgeon: Hildred Laser, MD;  Location: [facility];  Service: Gynecology;  Laterality: Bilateral;  with peritoneal biopsies   LAPAROSCOPY     MYRINGOTOMY WITH TUBE PLACEMENT     TUBAL LIGATION  03/26/2018     Current Outpatient Medications  Medication Sig Dispense Refill   BOTOX 100 units SOLR injection Inject 100 Units into the muscle every 3 (three) months.     Cholecalciferol (VITAMIN D) 50 MCG (2000 UT) CAPS Take 1 capsule by mouth daily.     Cyanocobalamin (VITAMIN B-12) 500 MCG SUBL Place 1 tablet (500 mcg total) under the tongue once  a week. 30 tablet 0   dicyclomine (BENTYL) 10 MG capsule Take 1 capsule (10 mg total) by mouth 3 (three) times daily as needed for spasms. 90 capsule 3   doxylamine, Sleep, (UNISOM) 25 MG tablet Take 25 mg by mouth at bedtime as needed.     Elagolix Sodium (ORILISSA) 150 MG TABS Take 1 tablet (150 mg total) by mouth daily. 30 tablet 11   famotidine (PEPCID) 40 MG tablet Take 1 tablet (40 mg total) by mouth at bedtime. 30 tablet 5   folic acid (FOLVITE) 800 MCG tablet Take 800 mcg by mouth daily. 800 mcg     linaclotide (LINZESS) 145 MCG CAPS capsule Take 1 capsule (145 mcg total) by mouth daily before breakfast for 8 days.     metoprolol succinate (TOPROL-XL) 50 MG 24 hr tablet Take 1 tablet (50 mg total) by mouth daily. 90 tablet 3   naproxen (NAPROSYN) 500 MG tablet Take 1 tablet (500 mg total) by mouth daily as needed (take with benadryll). 90 tablet 3   norethindrone (AYGESTIN) 5 MG tablet Take 1 tablet (5 mg total) by mouth daily. 90 tablet 3   pantoprazole (PROTONIX) 40 MG tablet Take 1 tablet (40 mg total) by mouth daily. No refills - needs f/up with PCP or GI 90 tablet 3   rosuvastatin (CRESTOR) 20 MG tablet Take 1 tablet (20 mg total) by mouth daily. 90 tablet 1   Current Facility-Administered Medications  Medication Dose Route Frequency Provider Last Rate Last Admin   lidocaine (XYLOCAINE) 1 % (with pres) injection 6 mL  6 mL Other Once         Allergies:   Budesonide-formoterol fumarate, Astelin [azelastine], Gabapentin, Nortriptyline, Fluogen [influenza virus vaccine], Lamictal [lamotrigine], Milnacipran, Tegaderm ag mesh [silver], Diclofenac, Duloxetine hcl, Montelukast sodium, Mupirocin, Naproxen, Naproxen sodium, Other, Oxycodone hcl, and Sulfa antibiotics    Social History:  The patient  reports that she quit smoking about 16 years ago. Her smoking use included cigarettes. She started smoking about 18 years ago. She has a 2 pack-year smoking history. She has never used smokeless  tobacco. She reports current alcohol use. She reports that she does not use drugs.   Family History:  The patient's She was adopted. Family history is unknown by patient.    ROS:  Please see the history of present illness.   Otherwise, review of systems are positive for none.   All other systems are reviewed and negative.    PHYSICAL EXAM: VS:  BP 118/70 (BP Location: Left Arm, Patient Position: Sitting, Cuff Size: Large)   Pulse 81   Ht 5\' 3"  (1.6 m)   Wt 179 lb 8 oz (81.4 kg)   SpO2 98%   BMI 31.80 kg/m  , BMI Body mass index is 31.8 kg/m. GEN: Well nourished, well developed, in no acute distress  HEENT: normal  Neck:  no JVD, carotid bruits, or masses Cardiac: RRR; no murmurs, rubs, or gallops,no edema  Respiratory:  clear to auscultation bilaterally, normal work of breathing GI: soft, nontender, nondistended, + BS MS: no deformity or atrophy  Skin: warm and dry, no rash Neuro:  Strength and sensation are intact Psych: euthymic mood, full affect   EKG:  EKG is ordered today. The ekg ordered today demonstrates normal sinus rhythm with no significant ST or T wave changes.   Recent Labs: 07/04/2023: ALT 35; BUN 7; Creat 0.80; Hemoglobin 13.6; Platelets 306; Potassium 4.2; Sodium 141    Lipid Panel    Component Value Date/Time   CHOL 126 02/10/2023 1121   CHOL 192 10/29/2015 1050   TRIG 142 02/10/2023 1121   HDL 41 (L) 02/10/2023 1121   HDL 43 10/29/2015 1050   CHOLHDL 3.1 02/10/2023 1121   VLDL 20 10/10/2016 1235   LDLCALC 62 02/10/2023 1121      Wt Readings from Last 3 Encounters:  08/10/23 179 lb 8 oz (81.4 kg)  08/08/23 181 lb (82.1 kg)  08/01/23 177 lb 2 oz (80.3 kg)       ASSESSMENT AND PLAN:  ASSESSMENT & PLAN:    1.  Inappropriate sinus tachycardia: I think symptoms are well-controlled even after decreasing the Toprol to 50 mg once daily.  I worry that if I increase the dose, her fatigue and exercise tolerance will be affected.  Continue regular  exercise and continue current dose of Toprol 50 mg once daily.  I asked her to notify her dentist not to use any epinephrine with local anesthetic.   2. Shortness of breath: Previous cardiac CTA was normal and echocardiogram was unremarkable.  3.  Hyperlipidemia: Lipid profile 2019 showed an LDL of 195 .  She is currently on rosuvastatin 20 mg once daily.  Most recent lipid profile showed an LDL of 62.    Disposition:   FU  in 6 months  Signed,   Lorine Bears, MD  08/10/2023 10:27 AM    [facility]

## 2023-08-10 NOTE — Patient Instructions (Signed)
Medication Instructions:  No changes *If you need a refill on your cardiac medications before your next appointment, please call your pharmacy*   Lab Work: None ordered If you have labs (blood work) drawn today and your tests are completely normal, you will receive your results only by: MyChart Message (if you have MyChart) OR A paper copy in the mail If you have any lab test that is abnormal or we need to change your treatment, we will call you to review the results.   Testing/Procedures: None ordered   Follow-Up: At [facility], you and your health needs are our priority.  As part of our continuing mission to provide you with exceptional heart care, we have created designated Provider Care Teams.  These Care Teams include your primary Cardiologist (physician) and Advanced Practice Providers (APPs -  Physician Assistants and Nurse Practitioners) who all work together to provide you with the care you need, when you need it.  We recommend signing up for the patient portal called "MyChart".  Sign up information is provided on this After Visit Summary.  MyChart is used to connect with patients for Virtual Visits (Telemedicine).  Patients are able to view lab/test results, encounter notes, upcoming appointments, etc.  Non-urgent messages can be sent to your provider as well.   To learn more about what you can do with MyChart, go to [email or web address].    Your next appointment:   6 month(s)  Provider:   You may see Dr. Kirke Corin or one of the following Advanced Practice Providers on your designated Care Team:   Nicolasa Ducking, NP Eula Listen, PA-C Cadence Fransico Michael, PA-C Charlsie Quest, NP    Other Instructions Please advise your dentist to avoid epinephrine and any epinephrine products.

## 2023-08-11 NOTE — Progress Notes (Unsigned)
Name: Yesenia Wood   MRN: [id number]    DOB: 1985/12/20   Date:08/14/2023       Progress Note  Subjective  Chief Complaint  Chief Complaint  Patient presents with   Follow-up    HPI  Interpreter today is: [name]  Headaches and paresthesias: seeing Dr. Neale Burly in [place] , she is going every 4 weeks to get Botox, she is late for her shot and headaches increased, up to daily now   Palpitation: sees Dr. Kirke Corin, her last visit at his office was with his NP Phineas Douglas. She had Echo 11/2019 that showed normal LVEF, CTA 09/2019 calcium score of zero. She had a 48 hour Holter that showed ST and was given Diltiazem but unable to tolerate it, she has been on Metoprolol and was doing well for a while but on her last visit 07/2020 she is down to 50 mg per day and symptoms seems to be controlled  Last visit with Dr. Kirke Corin was one week ago   FMS: she has seen  Rheumatologist - Dr. Dimple Casey and was given reassurance, pain from [facility]. She  tried PT and got worse, currently seeing PMR Dr. Wynn Banker and is getting acupuncture and she has noticed improvement of numbness . She is also taking Naproxen before bedtime . Pain is 5/10   Vitamin D deficiency: improved with supplementation , she also takes folic acid and B12 otc   Dyslipidemia: doing well on Rosuvastatin, last LDl was 61, HDL improved from 27 to 48 , continue current dose.  MDD: she used to see psychiatrist Dr. Elna Breslow but stopped going since medication did not help with symptoms, she was seeing clinical social worker - Willodean Rosenthal but she left her practice and she is currently looking for another therapist. Phq 9 today is up today, but she states she is feeling well. Discussed UV light therapy   Mild persistent asthma : she was seen by pulmonologist, does not have medications at home, she states none of the medications helped She continues to have intermittent sob, wheezing and cough intermittently . She is allergic to singulair . She has an  appointment scheduled for this Fall   IBS: under the care of GI and is following a low FODMAP diet and is taking Linzess 145 mg also taking PPI and h2 blocker and is doing better     Patient Active Problem List   Diagnosis Date Noted   Acute right ankle pain 06/14/2023   Sprain of left ankle 05/01/2023   Chronic pain syndrome 03/03/2023   Nausea 08/12/2022   Dyslipidemia 02/10/2022   Vitamin D deficiency 02/10/2022   Positive ANA (antinuclear antibody) 12/20/2021   Fibromyalgia 12/20/2021   Sleep difficulties 04/30/2020   Right sided numbness 12/26/2019   RLS (restless legs syndrome) 09/30/2019   MDD (major depressive disorder), recurrent episode, moderate (HCC) 07/17/2019   Social anxiety disorder 07/17/2019   Muteness 09/10/2018   OSA (obstructive sleep apnea) 10/10/2016   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   Dissocial personality disorder (HCC) 06/07/2016   Sleep disturbance 05/10/2016   Chronic migraine without aura 03/29/2016   Palpitations 06/09/2015   Deaf 05/16/2015   B12 deficiency 05/16/2015   Cochlear implant status 05/16/2015   Fatty infiltration of liver 05/16/2015   Gastro-esophageal reflux disease without esophagitis 05/16/2015   Generalized headache 05/16/2015   Right-sided Bell's palsy 05/16/2015   Hypertriglyceridemia 05/16/2015   Irregular menstrual cycle 05/16/2015   Overweight 05/16/2015   Paresthesia 05/16/2015   Disorder of  labyrinth 05/16/2015   Migraine 08/05/2013   Allergic rhinitis 07/01/2013   Obesity (BMI 30-39.9) 07/01/2013   Bartholin gland cyst 09/16/2008    Past Surgical History:  Procedure Laterality Date   BARTHOLIN GLAND CYST REMOVAL     COCHLEAR IMPLANT     COCHLEAR IMPLANT REMOVAL     DILATATION AND CURETTAGE/HYSTEROSCOPY WITH MINERVA N/A 03/05/2021   Procedure: DILATATION AND CURETTAGE/HYSTEROSCOPY WITH MINERVA;  Surgeon: Hildred Laser, MD;  Location: [facility] ORS;  Service: Gynecology;  Laterality: N/A;   LAPAROSCOPIC  TUBAL LIGATION Bilateral 03/26/2018   Procedure: LAPAROSCOPIC TUBAL LIGATION;  Surgeon: Hildred Laser, MD;  Location: [facility] OR[facility];  Service: Gynecology;  Laterality: Bilateral;  with peritoneal biopsies   LAPAROSCOPY     MYRINGOTOMY WITH TUBE PLACEMENT     TUBAL LIGATION  03/26/2018    Family History  Adopted: Yes  Family history unknown: Yes    Social History   Tobacco Use   Smoking status: Former    Current packs/day: 0.00    Average packs/day: 1 pack/day for 2.0 years (2.0 ttl pk-yrs)    Types: Cigarettes    Start date: 11/28/2004    Quit date: 11/28/2006    Years since quitting: 16.7   Smokeless tobacco: Never  Substance Use Topics   Alcohol use: Yes    Comment: 1 every 3 months     Current Outpatient Medications:    BOTOX 100 units SOLR injection, Inject 100 Units into the muscle every 3 (three) months., Disp: , Rfl:    Cholecalciferol (VITAMIN D) 50 MCG (2000 UT) CAPS, Take 1 capsule by mouth daily., Disp: , Rfl:    Cyanocobalamin (VITAMIN B-12) 500 MCG SUBL, Place 1 tablet (500 mcg total) under the tongue once a week., Disp: 30 tablet, Rfl: 0   dicyclomine (BENTYL) 10 MG capsule, Take 1 capsule (10 mg total) by mouth 3 (three) times daily as needed for spasms., Disp: 90 capsule, Rfl: 3   doxylamine, Sleep, (UNISOM) 25 MG tablet, Take 25 mg by mouth at bedtime as needed., Disp: , Rfl:    Elagolix Sodium (ORILISSA) 150 MG TABS, Take 1 tablet (150 mg total) by mouth daily., Disp: 30 tablet, Rfl: 11   famotidine (PEPCID) 40 MG tablet, Take 1 tablet (40 mg total) by mouth at bedtime., Disp: 30 tablet, Rfl: 5   folic acid (FOLVITE) 800 MCG tablet, Take 800 mcg by mouth daily. 800 mcg, Disp: , Rfl:    metoprolol succinate (TOPROL-XL) 50 MG 24 hr tablet, Take 1 tablet (50 mg total) by mouth daily., Disp: 90 tablet, Rfl: 3   naproxen (NAPROSYN) 500 MG tablet, Take 1 tablet (500 mg total) by mouth daily as needed (take with benadryll)., Disp: 90 tablet, Rfl: 3   norethindrone (AYGESTIN)  5 MG tablet, Take 1 tablet (5 mg total) by mouth daily., Disp: 90 tablet, Rfl: 3   pantoprazole (PROTONIX) 40 MG tablet, Take 1 tablet (40 mg total) by mouth daily. No refills - needs f/up with PCP or GI, Disp: 90 tablet, Rfl: 3   rosuvastatin (CRESTOR) 20 MG tablet, Take 1 tablet (20 mg total) by mouth daily., Disp: 90 tablet, Rfl: 1   linaclotide (LINZESS) 145 MCG CAPS capsule, Take 1 capsule (145 mcg total) by mouth daily before breakfast for 8 days., Disp: , Rfl:   Current Facility-Administered Medications:    lidocaine (XYLOCAINE) 1 % (with pres) injection 6 mL, 6 mL, Other, Once,   Allergies  Allergen Reactions   Budesonide-Formoterol Fumarate Anaphylaxis and Other (See  Comments)    Other reaction(s): Other (See Comments) chest pain Chest pain chest pain   Astelin [Azelastine]     Tingling, numbness, nausea   Gabapentin Nausea And Vomiting   Nortriptyline Other (See Comments)    Other reaction(s): Other (See Comments)   Fluogen [Influenza Virus Vaccine]     rash   Lamictal [Lamotrigine]     Self injurious thoughts   Milnacipran Other (See Comments)    Pressure in eyes   Tegaderm Ag Mesh [Silver] Itching   Diclofenac Other (See Comments)    Other reaction(s): Other (See Comments)   Duloxetine Hcl Other (See Comments)    Other reaction(s): Other (See Comments) Other reaction(s): unknown   Montelukast Sodium Other (See Comments)    Other reaction(s): wheezing/sob   Mupirocin Other (See Comments)   Naproxen Other (See Comments) and Rash    Other reaction(s): rash/itching   Naproxen Sodium Rash    mild rash   Other Rash   Oxycodone Hcl Rash   Sulfa Antibiotics Hives, Rash and Itching    I personally reviewed active problem list, medication list, allergies, family history with the patient/caregiver today.   ROS  Ten systems reviewed and is negative except as mentioned in HPI    Objective  Vitals:   08/14/23 1004  BP: 110/72  Pulse: 80  Resp: 14  Temp: 97.9 F  (36.6 C)  TempSrc: Oral  SpO2: 98%  Weight: 180 lb 11.2 oz (82 kg)  Height: 5\' 3"  (1.6 m)    Body mass index is 32.01 kg/m.  Physical Exam  Constitutional: Patient appears well-developed and well-nourished. Obese  No distress.  HEENT: head atraumatic, normocephalic, pupils equal and reactive to light, neck supple Cardiovascular: Normal rate, regular rhythm and normal heart sounds.  No murmur heard. No BLE edema. Pulmonary/Chest: Effort normal and breath sounds normal. No respiratory distress. Abdominal: Soft.  There is no tenderness. Psychiatric: Patient has a normal mood and affect. behavior is normal. Judgment and thought content normal.   Recent Results (from the past 2160 hour(s))  CBC with Differential/Platelet     Status: None   Collection Time: 05/25/23 12:16 PM  Result Value Ref Range   WBC 6.9 3.8 - 10.8 Thousand/uL   RBC 4.79 3.80 - 5.10 Million/uL   Hemoglobin 13.9 11.7 - 15.5 g/dL   HCT 19.1 47.8 - 29.5 %   MCV 88.1 80.0 - 100.0 fL   MCH 29.0 27.0 - 33.0 pg   MCHC 32.9 32.0 - 36.0 g/dL   RDW 62.1 30.8 - 65.7 %   Platelets 317 140 - 400 Thousand/uL   MPV 9.6 7.5 - 12.5 fL   Neutro Abs 3,974 1,500 - 7,800 cells/uL   Lymphs Abs 2,132 850 - 3,900 cells/uL   Absolute Monocytes 697 200 - 950 cells/uL   Eosinophils Absolute 48 15 - 500 cells/uL   Basophils Absolute 48 0 - 200 cells/uL   Neutrophils Relative % 57.6 %   Total Lymphocyte 30.9 %   Monocytes Relative 10.1 %   Eosinophils Relative 0.7 %   Basophils Relative 0.7 %  COMPLETE METABOLIC PANEL WITH GFR     Status: Abnormal   Collection Time: 05/25/23 12:16 PM  Result Value Ref Range   Glucose, Bld 90 65 - 99 mg/dL    Comment: .            Fasting reference interval .    BUN 11 7 - 25 mg/dL   Creat 8.46 9.62 - 9.52 mg/dL  eGFR 109 > OR = 60 mL/min/1.60m2   BUN/Creatinine Ratio SEE NOTE: 6 - 22 (calc)    Comment:    Not Reported: BUN and Creatinine are within    reference range. .    Sodium 140  135 - 146 mmol/L   Potassium 4.1 3.5 - 5.3 mmol/L   Chloride 103 98 - 110 mmol/L   CO2 29 20 - 32 mmol/L   Calcium 9.6 8.6 - 10.2 mg/dL   Total Protein 7.3 6.1 - 8.1 g/dL   Albumin 4.6 3.6 - 5.1 g/dL   Globulin 2.7 1.9 - 3.7 g/dL (calc)   AG Ratio 1.7 1.0 - 2.5 (calc)   Total Bilirubin 0.3 0.2 - 1.2 mg/dL   Alkaline phosphatase (APISO) 65 31 - 125 U/L   AST 21 10 - 30 U/L   ALT 38 (H) 6 - 29 U/L  POCT rapid strep A     Status: None   Collection Time: 07/04/23 11:31 AM  Result Value Ref Range   Rapid Strep A Screen Negative Negative  CBC with Differential/Platelet     Status: None   Collection Time: 07/04/23 12:17 PM  Result Value Ref Range   WBC 7.2 3.8 - 10.8 Thousand/uL   RBC 4.82 3.80 - 5.10 Million/uL   Hemoglobin 13.6 11.7 - 15.5 g/dL   HCT 95.6 21.3 - 08.6 %   MCV 86.9 80.0 - 100.0 fL   MCH 28.2 27.0 - 33.0 pg   MCHC 32.5 32.0 - 36.0 g/dL   RDW 57.8 46.9 - 62.9 %   Platelets 306 140 - 400 Thousand/uL   MPV 10.4 7.5 - 12.5 fL   Neutro Abs 3,830 1,500 - 7,800 cells/uL   Lymphs Abs 2,707 850 - 3,900 cells/uL   Absolute Monocytes 590 200 - 950 cells/uL   Eosinophils Absolute 29 15 - 500 cells/uL   Basophils Absolute 43 0 - 200 cells/uL   Neutrophils Relative % 53.2 %   Total Lymphocyte 37.6 %   Monocytes Relative 8.2 %   Eosinophils Relative 0.4 %   Basophils Relative 0.6 %  COMPLETE METABOLIC PANEL WITH GFR     Status: Abnormal   Collection Time: 07/04/23 12:17 PM  Result Value Ref Range   Glucose, Bld 83 65 - 99 mg/dL    Comment: .            Fasting reference interval .    BUN 7 7 - 25 mg/dL   Creat 5.28 4.13 - 2.44 mg/dL   eGFR 97 > OR = 60 WN/UUV/2.53G6   BUN/Creatinine Ratio SEE NOTE: 6 - 22 (calc)    Comment:    Not Reported: BUN and Creatinine are within    reference range. .    Sodium 141 135 - 146 mmol/L   Potassium 4.2 3.5 - 5.3 mmol/L   Chloride 102 98 - 110 mmol/L   CO2 29 20 - 32 mmol/L   Calcium 9.9 8.6 - 10.2 mg/dL   Total Protein 7.1 6.1  - 8.1 g/dL   Albumin 4.7 3.6 - 5.1 g/dL   Globulin 2.4 1.9 - 3.7 g/dL (calc)   AG Ratio 2.0 1.0 - 2.5 (calc)   Total Bilirubin 0.4 0.2 - 1.2 mg/dL   Alkaline phosphatase (APISO) 60 31 - 125 U/L   AST 26 10 - 30 U/L   ALT 35 (H) 6 - 29 U/L  Lipase     Status: None   Collection Time: 07/04/23 12:17 PM  Result Value Ref  Range   Lipase 13 7 - 60 U/L      PHQ2/9:    08/14/2023   10:08 AM 07/21/2023    2:06 PM 07/19/2023   10:47 AM 07/04/2023   11:13 AM 06/30/2023    3:45 PM  Depression screen PHQ 2/9  Decreased Interest 1 0 0 0 0  Down, Depressed, Hopeless 1 0 0 0 0  PHQ - 2 Score 2 0 0 0 0  Altered sleeping 1 0  0   Tired, decreased energy 3 0  0   Change in appetite 1 0  0   Feeling bad or failure about yourself  0 0  0   Trouble concentrating 1 0  0   Moving slowly or fidgety/restless 1 0  0   Suicidal thoughts 0 0  0   PHQ-9 Score 9 0  0   Difficult doing work/chores Somewhat difficult   Not difficult at all     phq 9 is positive   Fall Risk:    08/14/2023   10:06 AM 08/08/2023   11:13 AM 07/21/2023    2:05 PM 07/19/2023   10:47 AM 07/04/2023   11:13 AM  Fall Risk   Falls in the past year? 1 0 0 0 1  Number falls in past yr: 0 0  0 1  Injury with Fall? 0 0  0 1  Risk for fall due to : History of fall(s)  No Fall Risks  Impaired balance/gait  Follow up Falls prevention discussed  Falls prevention discussed  Falls prevention discussed;Education provided;Falls evaluation completed    Functional Status Survey: Is the patient deaf or have difficulty hearing?: Yes Does the patient have difficulty seeing, even when wearing glasses/contacts?: Yes Does the patient have difficulty concentrating, remembering, or making decisions?: Yes Does the patient have difficulty walking or climbing stairs?: Yes Does the patient have difficulty dressing or bathing?: Yes Does the patient have difficulty doing errands alone such as visiting a doctor's office or shopping?: Yes    Assessment  & Plan   1. MDD (major depressive disorder), recurrent episode, moderate (HCC)  Phq 9 is positive, she has tried multiple medication without success, she will try to see another therapist since that has been helpful in the past  2. Vitamin D deficiency  Continue supplementation   3. Fibromyalgia  Stable on medication  4. B12 deficiency  Discussed supplementation   5. Dyslipidemia  - rosuvastatin (CRESTOR) 20 MG tablet; Take 1 tablet (20 mg total) by mouth daily.  Dispense: 90 tablet; Refill: 1  6. Mild persistent asthma without complication  Stable  7. Chronic migraine without aura without status migrainosus, not intractable  Continue medications  8. Gastro-esophageal reflux disease without esophagitis  Doing well at this time

## 2023-08-14 ENCOUNTER — Ambulatory Visit (INDEPENDENT_AMBULATORY_CARE_PROVIDER_SITE_OTHER): Payer: Medicare (Managed Care) | Admitting: Family Medicine

## 2023-08-14 ENCOUNTER — Encounter: Payer: Self-pay | Admitting: Family Medicine

## 2023-08-14 VITALS — BP 110/72 | HR 80 | Temp 97.9°F | Resp 14 | Ht 63.0 in | Wt 180.7 lb

## 2023-08-14 DIAGNOSIS — E785 Hyperlipidemia, unspecified: Secondary | ICD-10-CM

## 2023-08-14 DIAGNOSIS — K219 Gastro-esophageal reflux disease without esophagitis: Secondary | ICD-10-CM

## 2023-08-14 DIAGNOSIS — G43709 Chronic migraine without aura, not intractable, without status migrainosus: Secondary | ICD-10-CM | POA: Diagnosis not present

## 2023-08-14 DIAGNOSIS — E538 Deficiency of other specified B group vitamins: Secondary | ICD-10-CM | POA: Diagnosis not present

## 2023-08-14 DIAGNOSIS — M797 Fibromyalgia: Secondary | ICD-10-CM

## 2023-08-14 DIAGNOSIS — Z23 Encounter for immunization: Secondary | ICD-10-CM

## 2023-08-14 DIAGNOSIS — E559 Vitamin D deficiency, unspecified: Secondary | ICD-10-CM | POA: Diagnosis not present

## 2023-08-14 DIAGNOSIS — J4530 Mild persistent asthma, uncomplicated: Secondary | ICD-10-CM | POA: Diagnosis not present

## 2023-08-14 DIAGNOSIS — F331 Major depressive disorder, recurrent, moderate: Secondary | ICD-10-CM | POA: Diagnosis not present

## 2023-08-14 MED ORDER — ROSUVASTATIN CALCIUM 20 MG PO TABS
20.0000 mg | ORAL_TABLET | Freq: Every day | ORAL | 1 refills | Status: DC
Start: 2023-08-14 — End: 2024-02-12

## 2023-08-16 ENCOUNTER — Encounter: Payer: Self-pay | Admitting: Physician Assistant

## 2023-08-16 DIAGNOSIS — M542 Cervicalgia: Secondary | ICD-10-CM | POA: Diagnosis not present

## 2023-08-16 DIAGNOSIS — M791 Myalgia, unspecified site: Secondary | ICD-10-CM | POA: Diagnosis not present

## 2023-08-16 DIAGNOSIS — G43109 Migraine with aura, not intractable, without status migrainosus: Secondary | ICD-10-CM | POA: Diagnosis not present

## 2023-08-16 DIAGNOSIS — G43719 Chronic migraine without aura, intractable, without status migrainosus: Secondary | ICD-10-CM | POA: Diagnosis not present

## 2023-08-16 DIAGNOSIS — G518 Other disorders of facial nerve: Secondary | ICD-10-CM | POA: Diagnosis not present

## 2023-08-18 ENCOUNTER — Ambulatory Visit: Payer: Medicare Other | Admitting: Physician Assistant

## 2023-08-18 DIAGNOSIS — Z23 Encounter for immunization: Secondary | ICD-10-CM | POA: Diagnosis not present

## 2023-08-22 ENCOUNTER — Encounter: Payer: Self-pay | Admitting: Family Medicine

## 2023-08-24 ENCOUNTER — Encounter: Payer: Self-pay | Admitting: Physician Assistant

## 2023-08-24 ENCOUNTER — Telehealth: Payer: Self-pay

## 2023-08-24 MED ORDER — LINACLOTIDE 290 MCG PO CAPS: 290.0000 ug | ORAL_CAPSULE | Freq: Every day | ORAL | 3 refills | Status: DC

## 2023-08-24 NOTE — Telephone Encounter (Signed)
Linzess 290 sent to pharmacy.  Tried 290 dosage of linzess. I was able to go bathroom much more easier. Stool are very soft but not runny.    With 290 doesn't feel like there's extra stool stuck nor any pain when passing stool after going to bathroom. Lot less bloating or discomfort in stomach too   With 145 I still get stuck sensation after passing stool one time and stool harden up lot even tho I drink plenty liquid, pain comes back for little bit bc of stool hardening.   So honestly I think 290 dosage works best for me. And I will like get RX for it for longer term usage. Thank you very much.

## 2023-08-27 NOTE — Progress Notes (Deleted)
HPI: NYLAN NAKATANI is a 37 y.o. female with PMHx has Deaf; B12 deficiency; Cochlear implant status; Fatty infiltration of liver; Gastro-esophageal reflux disease without esophagitis; Generalized headache; Right-sided Bell's palsy; Hypertriglyceridemia; Irregular menstrual cycle; Overweight; Paresthesia; Disorder of labyrinth; Palpitations; Sleep disturbance; Chronic migraine without aura; Dissocial personality disorder (HCC); OSA (obstructive sleep apnea); Insomnia due to mental condition; Muteness; MDD (major depressive disorder), recurrent episode, moderate (HCC); Social anxiety disorder; Allergic rhinitis; Bartholin gland cyst; Migraine; Obesity (BMI 30-39.9); RLS (restless legs syndrome); Right sided numbness; Sleep difficulties; Positive ANA (antinuclear antibody); Fibromyalgia; Dyslipidemia; Vitamin D deficiency; Nausea; Chronic pain syndrome; Sprain of left ankle; and Acute right ankle pain on their problem list. who presents to clinic for treatment of pain related to fibromyalgia/myofascial pain  via injection as described below.     No new concerns or complaints. No major changes in medical history since last visit.    Physical Exam:  General: Appropriate appearance for age.  Mental Status: Appropriate mood and affect.  Cardiovascular: RRR, no m/r/g.  Respiratory: CTAB, no rales/rhonchi/wheezing.  Skin: No apparent rashes or lesions.  Neuro: Awake, alert, and oriented x3. No apparent deficits.  MSK: Moving all 4 limbs antigravity and against resistance.  + TTP bilateral cervical paraspinals, trapezius, levator scapulae, rhomboid and lumbar paraspinal muscles   PROCEDURE:  Bilateral  trigger point injections Diagnosis:      ICD-10-CM    1. Myofascial pain  M79.18       2. Fibromyalgia  M79.7         Goals with treatment: [ x ] Decrease pain [  ] Improve Active / Passive ROM [ x ] Improve ADLs [  ] Improve functional mobility   MEDICATION:  [ X ] Lidocaine 1%      CONSENT:  Obtained in writing per policy. Consent uploaded to chart.   Benefits discussed.  Risks discussed included, but were not limited to, pain and discomfort, bleeding, bruising, allergic reaction, infection. All questions answered to patient/family member/guardian/ caregiver satisfaction. They would like to proceed with procedure. There are no noted contraindications to procedure.   PROCEDURE Time out was preformed No heat sources No antibiotics   The patient was explained about both the benefits and risks of a Bilateral  trigger point injections. After the patient acknowledged an understanding of the risks and benefits, the patient agreed to proceed. The area was first marked and then prepped in an aseptic fashion with betadine / alcohol. A 30 g, 0.5 inch needle was directed via a direct approach into the Bilateral cervical paraspinals, trapezius, levator scapulae, rhomboid and lumbar paraspinal muscles. The injection was completed with Kenalog 40 mg/ml 0.2 cc mixed with 6 cc of 1% lidocaine after no blood was aspirated on pull back.   No complications were encountered. The patient tolerated the procedure well.   Impression: HPI: MAKINZY CLEERE is a 37 y.o. female with PMHx has Deaf; B12 deficiency; Cochlear implant status; Fatty infiltration of liver; Gastro-esophageal reflux disease without esophagitis; Generalized headache; Right-sided Bell's palsy; Hypertriglyceridemia; Irregular menstrual cycle; Overweight; Paresthesia; Disorder of labyrinth; Palpitations; Sleep disturbance; Chronic migraine without aura; Dissocial personality disorder (HCC); OSA (obstructive sleep apnea); Insomnia due to mental condition; Muteness; MDD (major depressive disorder), recurrent episode, moderate (HCC); Social anxiety disorder; Allergic rhinitis; Bartholin gland cyst; Migraine; Obesity (BMI 30-39.9); RLS (restless legs syndrome); Right sided numbness; Sleep difficulties; Positive ANA (antinuclear antibody);  Fibromyalgia; Dyslipidemia; Vitamin D deficiency; Nausea; Chronic pain syndrome; Sprain of left ankle; and Acute right ankle  pain on their problem list. who presents to clinic for treatment of myofascial/FM pain . They received a  Bilateral  trigger point injections as above.    PLAN: - Resume Usual Activities. Notify Physician of any unusual bleeding, erythema or concern for side effects as reviewed above. - Apply ice prn for pain - Tylenol prn for pain   Patient/Care [name] was ready to learn without apparent learning barriers. Education was provided on diagnosis, treatment options/plan according to patient's preferred learning style. Patient/Care Giver verbalized understanding and agreement with the above plan.

## 2023-08-28 ENCOUNTER — Encounter: Payer: Self-pay | Admitting: Physical Medicine and Rehabilitation

## 2023-08-28 ENCOUNTER — Encounter (HOSPITAL_BASED_OUTPATIENT_CLINIC_OR_DEPARTMENT_OTHER): Payer: Medicare Other | Admitting: Physical Medicine and Rehabilitation

## 2023-08-28 ENCOUNTER — Encounter: Payer: Medicare Other | Admitting: Physical Medicine and Rehabilitation

## 2023-08-28 DIAGNOSIS — M797 Fibromyalgia: Secondary | ICD-10-CM

## 2023-08-28 DIAGNOSIS — M7918 Myalgia, other site: Secondary | ICD-10-CM | POA: Diagnosis not present

## 2023-08-28 MED ORDER — LIDOCAINE HCL 1 % IJ SOLN
6.0000 mL | Freq: Once | INTRAMUSCULAR | Status: AC
Start: 2023-08-28 — End: 2023-08-28
  Administered 2023-08-28: 6 mL via INTRADERMAL

## 2023-08-28 MED ORDER — TRIAMCINOLONE ACETONIDE 40 MG/ML IJ SUSP
5.0000 mg | Freq: Once | INTRAMUSCULAR | Status: AC
Start: 2023-08-28 — End: 2023-08-28
  Administered 2023-08-28: 5.2 mg via INTRAMUSCULAR

## 2023-09-01 ENCOUNTER — Other Ambulatory Visit: Payer: Self-pay | Admitting: Physician Assistant

## 2023-09-03 NOTE — Progress Notes (Signed)
HPI: Yesenia Wood is a 37 y.o. female with PMHx has Deaf; B12 deficiency; Cochlear implant status; Fatty infiltration of liver; Gastro-esophageal reflux disease without esophagitis; Generalized headache; Right-sided Bell's palsy; Hypertriglyceridemia; Irregular menstrual cycle; Overweight; Paresthesia; Disorder of labyrinth; Palpitations; Sleep disturbance; Chronic migraine without aura; Dissocial personality disorder (HCC); OSA (obstructive sleep apnea); Insomnia due to mental condition; Muteness; MDD (major depressive disorder), recurrent episode, moderate (HCC); Social anxiety disorder; Allergic rhinitis; Bartholin gland cyst; Migraine; Obesity (BMI 30-39.9); RLS (restless legs syndrome); Right sided numbness; Sleep difficulties; Positive ANA (antinuclear antibody); Fibromyalgia; Dyslipidemia; Vitamin D deficiency; Nausea; Chronic pain syndrome; Sprain of left ankle; and Acute right ankle pain on their problem list. who presents to clinic for treatment of pain related to myofascial pain  via injection as described below.    No new concerns or complaints. No major changes in medical history since last visit.   Physical Exam:  General: Appropriate appearance for age.  Mental Status: Appropriate mood and affect.  Cardiovascular: RRR, no m/r/g.  Respiratory: CTAB, no rales/rhonchi/wheezing.  Skin: No apparent rashes or lesions.  Neuro: Awake, alert, and oriented x3. No apparent deficits.  MSK: Moving all 4 limbs antigravity and against resistance.  + TTP with palpable trigger points in bilateral cervical paraspinals, trapezius muscles, thoracic paraspinals, rhomboids, quadratus and lumbar paraspinal muscles  PROCEDURE:  Bilateral  trigger point injections Diagnosis:    ICD-10-CM   1. Myofascial pain  M79.18 lidocaine (XYLOCAINE) 1 % (with pres) injection 6 mL    triamcinolone acetonide (KENALOG-40) injection 5.2 mg    2. Fibromyalgia  M79.7 lidocaine (XYLOCAINE) 1 % (with pres) injection 6 mL     triamcinolone acetonide (KENALOG-40) injection 5.2 mg      Goals with treatment: [ X ] Decrease pain [  ] Improve Active / Passive ROM [ x ] Improve ADLs [ ]  Improve functional mobility  MEDICATION:  [ x ] Kenalog 40 mg/mL  [ X ] Lidocaine 1%    CONSENT: Obtained in writing per policy. Consent uploaded to chart.  Benefits discussed.  Risks discussed included, but were not limited to, pain and discomfort, bleeding, bruising, allergic reaction, infection. All questions answered to patient/family member/guardian/ caregiver satisfaction. They would like to proceed with procedure. There are no noted contraindications to procedure.  PROCEDURE Time out was preformed No heat sources No antibiotics  The patient was explained about both the benefits and risks of a Bilateral  trigger point injections. After the patient acknowledged an understanding of the risks and benefits, the patient agreed to proceed. The area was first marked and then prepped in an aseptic fashion with betadine / alcohol. A 30 g, 1/2 inch needle was directed via a direct approach into the bilateral cervical paraspinals, trapezius muscles, thoracic paraspinals, rhomboids, quadratus and lumbar paraspinal muscles. The injection was completed with Kenalog 40 mg/ml 0.2 cc mixed with 6 cc of 1% lidocaine after no blood was aspirated on pull back.  No complications were encountered. The patient tolerated the procedure well.  Impression: HPI: Yesenia Wood is a 37 y.o. female with PMHx has Deaf; B12 deficiency; Cochlear implant status; Fatty infiltration of liver; Gastro-esophageal reflux disease without esophagitis; Generalized headache; Right-sided Bell's palsy; Hypertriglyceridemia; Irregular menstrual cycle; Overweight; Paresthesia; Disorder of labyrinth; Palpitations; Sleep disturbance; Chronic migraine without aura; Dissocial personality disorder (HCC); OSA (obstructive sleep apnea); Insomnia due to mental condition;  Muteness; MDD (major depressive disorder), recurrent episode, moderate (HCC); Social anxiety disorder; Allergic rhinitis; Bartholin gland cyst; Migraine; Obesity (BMI  30-39.9); RLS (restless legs syndrome); Right sided numbness; Sleep difficulties; Positive ANA (antinuclear antibody); Fibromyalgia; Dyslipidemia; Vitamin D deficiency; Nausea; Chronic pain syndrome; Sprain of left ankle; and Acute right ankle pain on their problem list. who presents to clinic for treatment of myofascial pain . They received a  Bilateral  trigger point injections as above.   PLAN: - Resume Usual Activities. Notify Physician of any unusual bleeding, erythema or concern for side effects as reviewed above. - Apply ice prn for pain - Tylenol prn for pain - Follow up Q70m PRN for injections; not within 4 weeks of accupuncture in order to not complicate assessment of therapeutic benefits  Patient/Care Yesenia Wood was ready to learn without apparent learning barriers. Education was provided on diagnosis, treatment options/plan according to patient's preferred learning style. Patient/Care Giver verbalized understanding and agreement with the above plan.   Angelina Sheriff, DO 09/04/2023

## 2023-09-08 ENCOUNTER — Encounter: Payer: Self-pay | Admitting: Physical Medicine and Rehabilitation

## 2023-09-13 DIAGNOSIS — G518 Other disorders of facial nerve: Secondary | ICD-10-CM | POA: Diagnosis not present

## 2023-09-13 DIAGNOSIS — G43109 Migraine with aura, not intractable, without status migrainosus: Secondary | ICD-10-CM | POA: Diagnosis not present

## 2023-09-13 DIAGNOSIS — M791 Myalgia, unspecified site: Secondary | ICD-10-CM | POA: Diagnosis not present

## 2023-09-13 DIAGNOSIS — M542 Cervicalgia: Secondary | ICD-10-CM | POA: Diagnosis not present

## 2023-09-19 ENCOUNTER — Encounter: Payer: Medicare Other | Admitting: Physical Medicine & Rehabilitation

## 2023-09-22 ENCOUNTER — Encounter: Payer: Medicare Other | Attending: Physical Medicine and Rehabilitation | Admitting: Physical Medicine & Rehabilitation

## 2023-09-22 ENCOUNTER — Encounter: Payer: Self-pay | Admitting: Physical Medicine & Rehabilitation

## 2023-09-22 VITALS — BP 121/86 | HR 76 | Ht 63.0 in | Wt 183.0 lb

## 2023-09-22 DIAGNOSIS — G43709 Chronic migraine without aura, not intractable, without status migrainosus: Secondary | ICD-10-CM | POA: Insufficient documentation

## 2023-09-22 NOTE — Progress Notes (Signed)
Acupuncture scheduled but no improvement in HA  after 4 treatments.  Will not perform further treatments F/u with Dr Shearon Stalls

## 2023-09-26 ENCOUNTER — Ambulatory Visit (INDEPENDENT_AMBULATORY_CARE_PROVIDER_SITE_OTHER): Payer: Medicare Other | Admitting: Obstetrics and Gynecology

## 2023-09-26 ENCOUNTER — Encounter: Payer: Self-pay | Admitting: Obstetrics and Gynecology

## 2023-09-26 VITALS — BP 113/75 | HR 83 | Ht 63.0 in | Wt 184.0 lb

## 2023-09-26 DIAGNOSIS — N938 Other specified abnormal uterine and vaginal bleeding: Secondary | ICD-10-CM | POA: Diagnosis not present

## 2023-09-26 DIAGNOSIS — N946 Dysmenorrhea, unspecified: Secondary | ICD-10-CM

## 2023-09-26 DIAGNOSIS — N808 Other endometriosis: Secondary | ICD-10-CM

## 2023-09-26 DIAGNOSIS — Z9889 Other specified postprocedural states: Secondary | ICD-10-CM

## 2023-09-26 DIAGNOSIS — Z79899 Other long term (current) drug therapy: Secondary | ICD-10-CM

## 2023-09-26 NOTE — Progress Notes (Signed)
    GYNECOLOGY PROGRESS NOTE  Subjective:    Patient ID: Yesenia Wood, female    DOB: 1986/08/29, 37 y.o.   MRN: [id number]  HPI  Patient is a 37 y.o. [id number] death female who presents for follow up on medication Dewayne Hatch for significant dysmenorrhea and dysfunctional uterine bleeding, with suspected adenomyosis/endometriosis.  Sign language interpretation provided by [facility] health interpreter.  Patient has a history of endometrial ablation for management of her bleeding. States overall she is doing well on the medication, currently on 200mg  dosing, initiated in June 2024. Does note still having some hot flushes and night sweats, even after initiation of supplemental Aygestin.  Did have an episode of bleeding in September, from the 10th-23rd, but otherwise has been ok. Still has some cramping, for which she takes Naproxen daily.   The following portions of the patient's history were reviewed and updated as appropriate: allergies, current medications, past family history, past medical history, past social history, past surgical history, and problem list.  Review of Systems Pertinent items are noted in HPI.   Objective:   Height 5\' 3"  (1.6 m), weight 184 lb (83.5 kg). Body mass index is 32.59 kg/m. General appearance: alert, cooperative, and no distress Remainder of exam deferred   Assessment:   1. Medication management   2. Dysmenorrhea   3. Other endometriosis   4. DUB (dysfunctional uterine bleeding)   5. History of endometrial ablation      Plan:   Can continue utilization of Orilissa at 200 mg dosing for total of approximately 6 months.  After this time we will need to decrease back down to 150 mg dosing which she can continue for an additional year and a half.  Will repeat liver function test today due to side effect of medication being potential transaminitis.  Advised on limiting use of NSAIDs to as needed.  Discussed risks of continuous NSAID use and risk of GI bleeding.   Patient notes understanding. Advised on other conservative management for hot flashes as needed. Follow-up in 3 months.  At this time patient will need to change Orilissa dosing to 150 mg.   A total of 19 minutes were spent face-to-face with the patient during this encounter and over half of that time dealt with counseling and coordination of care.     Hildred Laser, MD [facility]

## 2023-09-27 LAB — HEPATIC FUNCTION PANEL
ALT: 19 [IU]/L (ref 0–32)
AST: 21 [IU]/L (ref 0–40)
Albumin: 4.5 g/dL (ref 3.9–4.9)
Alkaline Phosphatase: 60 [IU]/L (ref 44–121)
Bilirubin Total: 0.3 mg/dL (ref 0.0–1.2)
Bilirubin, Direct: <0.1 mg/dL (ref 0.00–0.40)
Total Protein: 6.9 g/dL (ref 6.0–8.5)

## 2023-09-29 DIAGNOSIS — H5203 Hypermetropia, bilateral: Secondary | ICD-10-CM | POA: Diagnosis not present

## 2023-09-29 DIAGNOSIS — H5000 Unspecified esotropia: Secondary | ICD-10-CM | POA: Diagnosis not present

## 2023-09-29 DIAGNOSIS — M3501 Sicca syndrome with keratoconjunctivitis: Secondary | ICD-10-CM | POA: Diagnosis not present

## 2023-10-04 ENCOUNTER — Encounter
Payer: Medicare Other | Attending: Physical Medicine and Rehabilitation | Admitting: Physical Medicine and Rehabilitation

## 2023-10-04 VITALS — BP 116/81 | HR 85 | Ht 63.0 in | Wt 182.0 lb

## 2023-10-04 DIAGNOSIS — R531 Weakness: Secondary | ICD-10-CM | POA: Insufficient documentation

## 2023-10-04 DIAGNOSIS — M7918 Myalgia, other site: Secondary | ICD-10-CM | POA: Diagnosis not present

## 2023-10-04 DIAGNOSIS — M797 Fibromyalgia: Secondary | ICD-10-CM | POA: Insufficient documentation

## 2023-10-04 DIAGNOSIS — F331 Major depressive disorder, recurrent, moderate: Secondary | ICD-10-CM | POA: Diagnosis not present

## 2023-10-04 DIAGNOSIS — R200 Anesthesia of skin: Secondary | ICD-10-CM | POA: Diagnosis not present

## 2023-10-04 MED ORDER — TRIAMCINOLONE ACETONIDE 40 MG/ML IJ SUSP
5.0000 mg | Freq: Once | INTRAMUSCULAR | Status: AC
  Administered 2023-10-04: 5.2 mg via INTRAMUSCULAR

## 2023-10-04 MED ORDER — LIDOCAINE HCL 1 % IJ SOLN
6.0000 mL | Freq: Once | INTRAMUSCULAR | Status: AC
  Administered 2023-10-04: 6 mL

## 2023-10-04 NOTE — Progress Notes (Signed)
HPI: Yesenia Wood is a 37 y.o. female with PMHx has Deaf; B12 deficiency; Cochlear implant status; Fatty infiltration of liver; Gastro-esophageal reflux disease without esophagitis; Generalized headache; Right-sided Bell's palsy; Hypertriglyceridemia; Irregular menstrual cycle; Overweight; Paresthesia; Disorder of labyrinth; Palpitations; Sleep disturbance; Chronic migraine without aura; Dissocial personality disorder (HCC); OSA (obstructive sleep apnea); Insomnia due to mental condition; Muteness; MDD (major depressive disorder), recurrent episode, moderate (HCC); Social anxiety disorder; Allergic rhinitis; Bartholin gland cyst; Migraine; Obesity (BMI 30-39.9); RLS (restless legs syndrome); Right sided numbness; Sleep difficulties; Positive ANA (antinuclear antibody); Fibromyalgia; Dyslipidemia; Vitamin D deficiency; Nausea; Chronic pain syndrome; Sprain of left ankle; and Acute right ankle pain on their problem list. who presents to clinic for treatment of pain related to myofascial pain  via injection as described below.    Patient recently messaged provider regarding worsening weakness, numbness, and pain on her right side.  Today, she states that it is continuing to worsen, and she feels like it significantly worse from prior flares.  It affects both her right upper and lower extremity.  Notable, she has a history of Bell's palsy in her right face as well.      Physical Exam:  General: Appropriate appearance for age.  Mental Status: Appropriate mood and affect.  Cardiovascular: RRR, no m/r/g.  Respiratory: CTAB, no rales/rhonchi/wheezing.  Skin: No apparent rashes or lesions.  Neuro: Awake, alert, and oriented x3. No apparent deficits.  + Sensation to light touch decreased diffusely on the right arm and leg  Coordination: No deficits in fine motor or ataxia noted.   MSK: Moving all 4 limbs antigravity and against resistance; 4/5 throughout + TTP with palpable trigger points in bilateral  cervical paraspinals, trapezius muscles, levator scapulae, and thoracic paraspinals    PROCEDURE:  Bilateral  trigger point injections Diagnosis:      ICD-10-CM    1. Myofascial pain  M79.18 lidocaine (XYLOCAINE) 1 % (with pres) injection 6 mL      triamcinolone acetonide (KENALOG-40) injection 5.2 mg     2. Fibromyalgia  M79.7 lidocaine (XYLOCAINE) 1 % (with pres) injection 6 mL      triamcinolone acetonide (KENALOG-40) injection 5.2 mg       Goals with treatment: [ X ] Decrease pain [  ] Improve Active / Passive ROM [ x ] Improve ADLs [ ]  Improve functional mobility   MEDICATION:  [ x ] Kenalog 40 mg/mL  [ X ] Lidocaine 1%      CONSENT: Obtained in writing per policy. Consent uploaded to chart.   Benefits discussed.  Risks discussed included, but were not limited to, pain and discomfort, bleeding, bruising, allergic reaction, infection. All questions answered to patient/family member/guardian/ caregiver satisfaction. They would like to proceed with procedure. There are no noted contraindications to procedure.   PROCEDURE Time out was preformed No heat sources No antibiotics   The patient was explained about both the benefits and risks of a Bilateral  trigger point injections. After the patient acknowledged an understanding of the risks and benefits, the patient agreed to proceed. The area was first marked and then prepped in an aseptic fashion with betadine / alcohol. A 30 g, 1/2 inch needle was directed via a direct approach into the bilateral cervical paraspinals, trapezius muscles, thoracic paraspinals, rhomboids, quadratus and lumbar paraspinal muscles. The injection was completed with Kenalog 40 mg/ml 0.2 cc mixed with 6 cc of 1% lidocaine after no blood was aspirated on pull back.   No complications were encountered. The  patient tolerated the procedure well.   Impression: HPI: Yesenia Wood is a 37 y.o. female with PMHx has Deaf; B12 deficiency; Cochlear implant  status; Fatty infiltration of liver; Gastro-esophageal reflux disease without esophagitis; Generalized headache; Right-sided Bell's palsy; Hypertriglyceridemia; Irregular menstrual cycle; Overweight; Paresthesia; Disorder of labyrinth; Palpitations; Sleep disturbance; Chronic migraine without aura; Dissocial personality disorder (HCC); OSA (obstructive sleep apnea); Insomnia due to mental condition; Muteness; MDD (major depressive disorder), recurrent episode, moderate (HCC); Social anxiety disorder; Allergic rhinitis; Bartholin gland cyst; Migraine; Obesity (BMI 30-39.9); RLS (restless legs syndrome); Right sided numbness; Sleep difficulties; Positive ANA (antinuclear antibody); Fibromyalgia; Dyslipidemia; Vitamin D deficiency; Nausea; Chronic pain syndrome; Sprain of left ankle; and Acute right ankle pain on their problem list. who presents to clinic for treatment of myofascial pain . They received a  Bilateral  trigger point injections as above; also c/o worsening R hemisensory and motor deficits, worsening.    PLAN: Myofascial pain - Resume Usual Activities. Notify Physician of any unusual bleeding, erythema or concern for side effects as reviewed above. - Apply ice prn for pain - Tylenol prn for pain - Follow up Q73m PRN for injections   Right sided weakness and numbness, worsening acutely She has undergone extensive workup for this in the past with no significant findings, but has gone through extensive PT and conservative treatment since then with worsening of symptoms, so feel it is reasonable to repeat imaging at this time.  Ordered MRI brain, C/T/L spine     Patient/Care Giver was ready to learn without apparent learning barriers. Education was provided on diagnosis, treatment options/plan according to patient's preferred learning style. Patient/Care Giver verbalized understanding and agreement with the above plan.    Angelina Sheriff, DO 10/04/2023

## 2023-10-11 ENCOUNTER — Encounter: Payer: Self-pay | Admitting: Primary Care

## 2023-10-11 ENCOUNTER — Ambulatory Visit (INDEPENDENT_AMBULATORY_CARE_PROVIDER_SITE_OTHER): Payer: Medicare Other | Admitting: Primary Care

## 2023-10-11 ENCOUNTER — Encounter: Payer: Self-pay | Admitting: Physical Medicine and Rehabilitation

## 2023-10-11 ENCOUNTER — Encounter: Payer: Self-pay | Admitting: Family Medicine

## 2023-10-11 VITALS — BP 124/88 | HR 93 | Temp 97.6°F | Ht 63.0 in | Wt 184.8 lb

## 2023-10-11 DIAGNOSIS — J3089 Other allergic rhinitis: Secondary | ICD-10-CM | POA: Diagnosis not present

## 2023-10-11 DIAGNOSIS — F5101 Primary insomnia: Secondary | ICD-10-CM

## 2023-10-11 DIAGNOSIS — M797 Fibromyalgia: Secondary | ICD-10-CM

## 2023-10-11 MED ORDER — IPRATROPIUM BROMIDE 0.03 % NA SOLN: 2.0000 | Freq: Two times a day (BID) | NASAL | 5 refills | Status: DC

## 2023-10-11 NOTE — Progress Notes (Signed)
@Patient  ID: Yesenia Wood, female    DOB: 1986/01/27, 37 y.o.   MRN: [id number]  Chief Complaint  Patient presents with   Consult    Restless sleep and fatigue during the day.     Referring provider: Alba Cory, MD  HPI: 37 year old female, former smoker quit in 2008.  Past medical history significant for OSA, asthma, dyslipidemia, vitamin D deficiency, positive ANA, fibromyalgia, restless leg syndrome, depression, insomnia, muteness, cochlear implant, Bell's palsy.  10/11/2023 Discussed the use of AI scribe software for clinical note transcription with the patient, who gave verbal consent to proceed.  History of Present Illness   Patient presents for a sleep consultation. She is mute and sign language interpreter use via video call was used.  The primary concern is nocturnal awakenings due to pain, which she describes as the most significant factor disrupting her sleep. This pain, attributed to fibromyalgia, has been worse recently and is currently managed with naproxen. However, due to emerging bowel issues, the patient is considering discontinuing naproxen. She has tried multiple medications for fibromyalgia in the past, but none have provided significant relief.  The patient also reports that allergies, manifesting as nasal congestion and a runny nose, are the second most common cause of her sleep disturbances. She experiences these symptoms daily, [place] Current management includes Benadryl Plus. She has a history of asthma and has previously received allergy shots, but these were discontinued due to migraines.  The patient also experiences palpitations, which wake her up once or twice a week. However, she considers this a less significant issue compared to the pain and allergies.  Insomnia is managed with Unisom, taken two to three times a week. However, the patient is concerned about potential dependency and reports residual drowsiness the  following day. She was told by a partner in the past more than 10 years ago that she snores. PSG in 2017 was negative for obstructive sleep apnea, AHI was 0. She has not noticed any significant weight changes in the past couple of years, fluctuating between 170 and 180 pounds. Denies loud snoring or waking up gasping or choking.   The patient sleeps best on her side, as it facilitates easier breathing. When sleeping on her back, she prefers not to use a pillow, as it positions her head uncomfortably. She is considering purchasing a new mattress to potentially improve her sleep quality.       Allergies  Allergen Reactions   Budesonide-Formoterol Fumarate Anaphylaxis and Other (See Comments)    Other reaction(s): Other (See Comments) chest pain Chest pain chest pain   Fluogen [Influenza Virus Vaccine] Hives    rash   Astelin [Azelastine]     Tingling, numbness, nausea   Gabapentin Nausea And Vomiting   Nortriptyline Other (See Comments)    Other reaction(s): Other (See Comments)   Lamictal [Lamotrigine]     Self injurious thoughts   Milnacipran Other (See Comments)    Pressure in eyes   Tegaderm Ag Mesh [Silver] Itching   Diclofenac Other (See Comments)    Other reaction(s): Other (See Comments)   Duloxetine Hcl Other (See Comments)    Other reaction(s): Other (See Comments) Other reaction(s): unknown   Montelukast Sodium Other (See Comments)    Other reaction(s): wheezing/sob   Mupirocin Other (See Comments)   Naproxen Other (See Comments) and Rash    Other reaction(s): rash/itching   Naproxen Sodium Rash    mild rash   Other Rash  Oxycodone Hcl Rash   Sulfa Antibiotics Hives, Rash and Itching    Immunization History  Administered Date(s) Administered   Influenza, Mdck, Trivalent,PF 6+ MOS(egg free) 08/18/2023   Influenza,inj,Quad PF,6+ Mos 07/20/2015, 09/01/2017, 09/25/2018, 08/09/2019, 09/08/2020, 07/26/2021, 08/12/2022   Influenza-Unspecified 09/12/2014, 08/24/2016    PFIZER Comirnaty(Gray Top)Covid-19 Tri-Sucrose Vaccine 03/03/2020, 03/24/2020, 10/30/2020, 05/21/2021   PNEUMOCOCCAL CONJUGATE-20 07/26/2021   Pfizer Covid-19 Vaccine Bivalent Booster 53yrs & up 11/02/2021   Pfizer(Comirnaty)Fall Seasonal Vaccine 12 years and older 09/19/2022   Pneumococcal Conjugate-13 11/24/2014   Pneumococcal Polysaccharide-23 11/24/2014   Tdap 11/28/2009, 03/16/2016    Past Medical History:  Diagnosis Date   ADHD (attention deficit hyperactivity disorder)    B12 deficiency    Bell's palsy    right sided   Chest pain    a. 05/2015 ETT: elev HR/BP, but no acute ST/T changes; b. 09/2019 Cor CTA: Nl cors, Ca2+ = 0.   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   Fatigue    Fatty liver    Fibromyalgia    GERD (gastroesophageal reflux disease)    Headache    High triglycerides    Hyperglycemia    Hypertension    Hyperthyroidism    Moderate asthma    Obesity    Paresthesia    Premature birth     Tobacco History: Social History   Tobacco Use  Smoking Status Former   Current packs/day: 0.00   Average packs/day: 1 pack/day for 2.0 years (2.0 ttl pk-yrs)   Types: Cigarettes   Start date: 11/28/2004   Quit date: 11/28/2006   Years since quitting: 16.8  Smokeless Tobacco Never   Counseling given: Not Answered   Outpatient Medications Prior to Visit  Medication Sig Dispense Refill   BOTOX 100 units SOLR injection Inject 100 Units into the muscle every 3 (three) months.     Cholecalciferol (VITAMIN D) 50 MCG (2000 UT) CAPS Take 1 capsule by mouth daily.     Cyanocobalamin (VITAMIN B-12) 500 MCG SUBL Place 1 tablet (500 mcg total) under the tongue once a week. 30 tablet 0   dicyclomine (BENTYL) 10 MG capsule TAKE 1 CAPSULE (10 MG TOTAL) BY MOUTH 3 (THREE) TIMES DAILY AS NEEDED FOR SPASMS. 270 capsule 1   doxylamine, Sleep, (UNISOM) 25 MG tablet Take 25 mg by mouth at  bedtime as needed.     Elagolix Sodium (ORILISSA) 150 MG TABS Take 1 tablet (150 mg total) by mouth daily. 30 tablet 11   famotidine (PEPCID) 40 MG tablet Take 1 tablet (40 mg total) by mouth at bedtime. 30 tablet 5   folic acid (FOLVITE) 400 MCG tablet Take 800 mcg by mouth daily.     folic acid (FOLVITE) 800 MCG tablet Take 800 mcg by mouth daily. 800 mcg     linaclotide (LINZESS) 290 MCG CAPS capsule Take 1 capsule (290 mcg total) by mouth daily before breakfast. 30 capsule 3   metoprolol succinate (TOPROL-XL) 50 MG 24 hr tablet Take 1 tablet (50 mg total) by mouth daily. 90 tablet 3   naproxen (NAPROSYN) 500 MG tablet Take 1 tablet (500 mg total) by mouth daily as needed (take with benadryll). 90 tablet 3   norethindrone (AYGESTIN) 5 MG tablet Take 1 tablet (5 mg total) by mouth daily. 90 tablet 3   pantoprazole (PROTONIX) 40 MG tablet Take 1 tablet (40 mg total) by mouth daily.  No refills - needs f/up with PCP or GI 90 tablet 3   rosuvastatin (CRESTOR) 20 MG tablet Take 1 tablet (20 mg total) by mouth daily. 90 tablet 1   Facility-Administered Medications Prior to Visit  Medication Dose Route Frequency Provider Last Rate Last Admin   lidocaine (XYLOCAINE) 1 % (with pres) injection 6 mL  6 mL Other Once        Review of Systems  Review of Systems  Constitutional:  Positive for fatigue.  HENT:  Positive for congestion and postnasal drip.   Respiratory: Negative.    Musculoskeletal:  Positive for arthralgias.  Psychiatric/Behavioral:  Positive for sleep disturbance.    Physical Exam  BP 124/88 (BP Location: Left Arm, Patient Position: Sitting, Cuff Size: Normal)   Pulse 93   Temp 97.6 F (36.4 C) (Temporal)   Ht 5\' 3"  (1.6 m)   Wt 184 lb 12.8 oz (83.8 kg)   SpO2 97%   BMI 32.74 kg/m  Physical Exam Constitutional:      Appearance: Normal appearance.  HENT:     Ears:     Comments: Mute Cardiovascular:     Rate and Rhythm: Normal rate and regular rhythm.  Pulmonary:      Effort: Pulmonary effort is normal.     Breath sounds: Normal breath sounds.  Musculoskeletal:        General: Normal range of motion.  Skin:    General: Skin is warm and dry.  Neurological:     General: No focal deficit present.     Mental Status: She is alert and oriented to person, place, and time. Mental status is at baseline.  Psychiatric:        Mood and Affect: Mood normal.        Behavior: Behavior normal.        Thought Content: Thought content normal.        Judgment: Judgment normal.      Lab Results:  CBC    Component Value Date/Time   WBC 7.2 07/04/2023 1217   RBC 4.82 07/04/2023 1217   HGB 13.6 07/04/2023 1217   HGB 14.9 10/14/2020 1143   HCT 41.9 07/04/2023 1217   HCT 45.3 10/14/2020 1143   PLT 306 07/04/2023 1217   PLT 330 10/14/2020 1143   MCV 86.9 07/04/2023 1217   MCV 88 10/14/2020 1143   MCV 86 08/13/2014 2040   MCH 28.2 07/04/2023 1217   MCHC 32.5 07/04/2023 1217   RDW 12.4 07/04/2023 1217   RDW 13.0 10/14/2020 1143   RDW 12.5 08/13/2014 2040   LYMPHSABS 2,707 07/04/2023 1217   LYMPHSABS 4.7 (H) 06/23/2015 0932   MONOABS 0.6 07/17/2021 1301   EOSABS 29 07/04/2023 1217   EOSABS 0.1 06/23/2015 0932   BASOSABS 43 07/04/2023 1217   BASOSABS 0.0 06/23/2015 0932    BMET    Component Value Date/Time   NA 141 07/04/2023 1217   NA 139 10/29/2015 1050   NA 137 08/13/2014 2040   K 4.2 07/04/2023 1217   K 3.8 08/13/2014 2040   CL 102 07/04/2023 1217   CL 104 08/13/2014 2040   CO2 29 07/04/2023 1217   CO2 23 08/13/2014 2040   GLUCOSE 83 07/04/2023 1217   GLUCOSE 74 08/13/2014 2040   BUN 7 07/04/2023 1217   BUN 7 10/29/2015 1050   BUN 10 08/13/2014 2040   CREATININE 0.80 07/04/2023 1217   CALCIUM 9.9 07/04/2023 1217   CALCIUM 9.3 08/13/2014 2040   GFRNONAA >60 07/18/2021  1817   GFRNONAA 91 10/09/2020 1114   GFRAA 105 10/09/2020 1114    BNP No results found for: "BNP"  ProBNP No results found for: "PROBNP"  Imaging: No results  found.   Assessment & Plan:   1. Primary insomnia  2. Fibromyalgia  3. Non-seasonal allergic rhinitis due to other allergic trigger  Fibromyalgia Currently managed by Dr. Shearon Stalls with Naproxen but medication appears ineffective and considering discontinuation due to GI concerns. She has a follow-up with Dr. Shearon Stalls in January and plans discuss ongoing management of her symptoms then  Allergic Rhinitis Daily symptoms of congestion and occasional runny nose, currently managed with Benadryl Plus -Start over-the-counter Claritin 10mg  daily and Flonase once daily. -Add prescription Atrovent nasal spray twice daily.  Restless sleep PSG in 2017 was negative for obstructive sleep apnea, AHI was 0/hour with SpO2 low 92% (RDI 8.5). No significant weight changes since last sleep study. She denies loud snoring, waking up gasping/choking at night. Low suspicion for sleep apnea. Sleep disruption felt to be due to pain from fibromyalgia and allergy symptoms.  -If sleep dose not improve with pain management and better control of allergy symptoms would consider repeating sleep study at that time   Insomnia She occasionally takes Unisom for insomnia but has concerns about dependency and experiences next-day drowsiness. -Discontinue Unisom and Benadryl. -Start over-the-counter Melatonin 2.5-5mg  at bedtime.  Obesity Struggling with weight loss -Recommend discussing weight loss options with primary care provider.  Follow-up in March 2025 to reassess sleep issues. If no improvement, consider sleep study.      Glenford Bayley, NP 10/11/2023

## 2023-10-11 NOTE — Patient Instructions (Addendum)
Low suspicion you have sleep apnea, I think you sleep issues are due to fibromyalgia and allergies  Recommendations: Start Loratadine (Claritin) 10mg  daily (over the counter) Start Atrovent nasal spray twice daily (prescription) Use Fluticasone (flonase) nasal spray once a day as needed (over the counter) Take 2.5-5mg  melatonin at bedtime for sleep (over the counter) Look into getting medcline pillow for side sleepers  Discuss weight loss options with primary care Follow up with pain specialist in January regarding fibromyalgia   Follow-up March with [facility] NP or sooner if needed / if no improvement in sleep we can cosider sleep study at that time

## 2023-10-17 DIAGNOSIS — G43109 Migraine with aura, not intractable, without status migrainosus: Secondary | ICD-10-CM | POA: Diagnosis not present

## 2023-10-17 DIAGNOSIS — G518 Other disorders of facial nerve: Secondary | ICD-10-CM | POA: Diagnosis not present

## 2023-10-17 DIAGNOSIS — M542 Cervicalgia: Secondary | ICD-10-CM | POA: Diagnosis not present

## 2023-10-17 DIAGNOSIS — M791 Myalgia, unspecified site: Secondary | ICD-10-CM | POA: Diagnosis not present

## 2023-10-28 ENCOUNTER — Ambulatory Visit
Admission: RE | Admit: 2023-10-28 | Discharge: 2023-10-28 | Disposition: A | Discharge status: Home / Self Care | Payer: Medicare Other | Source: Ambulatory Visit | Attending: Physical Medicine and Rehabilitation

## 2023-10-28 ENCOUNTER — Ambulatory Visit
Admission: RE | Admit: 2023-10-28 | Discharge: 2023-10-28 | Disposition: A | Discharge status: Home / Self Care | Payer: Medicare Other | Source: Ambulatory Visit | Attending: Physical Medicine and Rehabilitation | Admitting: Physical Medicine and Rehabilitation

## 2023-10-28 DIAGNOSIS — R531 Weakness: Secondary | ICD-10-CM

## 2023-10-28 DIAGNOSIS — R519 Headache, unspecified: Secondary | ICD-10-CM | POA: Diagnosis not present

## 2023-10-28 DIAGNOSIS — R200 Anesthesia of skin: Secondary | ICD-10-CM

## 2023-10-28 DIAGNOSIS — R202 Paresthesia of skin: Secondary | ICD-10-CM | POA: Diagnosis not present

## 2023-10-28 DIAGNOSIS — M542 Cervicalgia: Secondary | ICD-10-CM | POA: Diagnosis not present

## 2023-10-30 ENCOUNTER — Ambulatory Visit
Admission: RE | Admit: 2023-10-30 | Discharge: 2023-10-30 | Disposition: A | Discharge status: Home / Self Care | Payer: Medicare Other | Source: Ambulatory Visit | Attending: Physical Medicine and Rehabilitation | Admitting: Physical Medicine and Rehabilitation

## 2023-10-30 ENCOUNTER — Encounter: Payer: Self-pay | Admitting: Physical Medicine and Rehabilitation

## 2023-10-30 ENCOUNTER — Ambulatory Visit
Admission: RE | Admit: 2023-10-30 | Discharge: 2023-10-30 | Disposition: A | Discharge status: Home / Self Care | Payer: Medicare Other | Source: Ambulatory Visit | Attending: Physical Medicine and Rehabilitation

## 2023-10-30 DIAGNOSIS — R200 Anesthesia of skin: Secondary | ICD-10-CM

## 2023-10-30 DIAGNOSIS — M546 Pain in thoracic spine: Secondary | ICD-10-CM | POA: Diagnosis not present

## 2023-10-30 DIAGNOSIS — M5126 Other intervertebral disc displacement, lumbar region: Secondary | ICD-10-CM | POA: Diagnosis not present

## 2023-10-30 DIAGNOSIS — R531 Weakness: Secondary | ICD-10-CM

## 2023-10-30 DIAGNOSIS — M47816 Spondylosis without myelopathy or radiculopathy, lumbar region: Secondary | ICD-10-CM | POA: Diagnosis not present

## 2023-10-30 NOTE — Progress Notes (Signed)
Celso Amy, PA-C [address]  [address]  [place], [place] [address]  Main: [phone]  Fax: [phone]   Primary Care Physician: Alba Cory, MD  Primary Gastroenterologist:  Celso Amy, PA-C   CC: Follow-up constipation, abdominal pain, IBS, GERD  HPI: Yesenia Wood is a 37 y.o. female returns for 26-month follow-up of GERD, constipation, and irritable bowel syndrome.  We are using sign language interpreter from [facility] language services.  3 months ago was switched from omeprazole to pantoprazole which worked better.  Also added famotidine 40 Mg daily.  MiraLAX did not work well.  Low FODMAP diet helped with bloating.  Takes Zofran as needed for nausea.  3 months ago was started on Linzess for constipation.  She tried Linzess 72, 145, and 290 doses.  Linzess 290 worked very well and she was given prescription.  She had less bloating and great improvement with constipation.  Soft bowel movement daily.  Currently she has no more acid reflux.  She would like to decrease GERD medication.  Stools are little too soft on Linzess 290.  She would like to decrease Linzess dose.  She has no more abdominal pain or other GI symptoms at present.  Not currently taking dicyclomine, not needed.  GI HISTORY: -Lab 07/04/2023 showed normal CBC, CMP, and lipase.  ALT was slightly elevated at 35.  Hx chronically elevated ALT due to fatty liver.  -Complete abdominal ultrasound 07/04/2023 showed hepatic steatosis, otherwise normal.  No gallstones. -Abdominal x-ray 06/06/2023 showed prominent gas in the colon, otherwise normal.  Small amount of stool throughout the colon.  No bowel obstruction.   -She had pelvic ultrasound through her GYN. -Saw GI 10 years ago in [place] and had a normal colonoscopy.  Current Outpatient Medications  Medication Sig Dispense Refill   BOTOX 100 units SOLR injection Inject 100 Units into the muscle every 3 (three) months.     Cholecalciferol (VITAMIN D) 50 MCG (2000  UT) CAPS Take 1 capsule by mouth daily.     Cyanocobalamin (VITAMIN B-12) 500 MCG SUBL Place 1 tablet (500 mcg total) under the tongue once a week. 30 tablet 0   dicyclomine (BENTYL) 10 MG capsule TAKE 1 CAPSULE (10 MG TOTAL) BY MOUTH 3 (THREE) TIMES DAILY AS NEEDED FOR SPASMS. 270 capsule 1   doxylamine, Sleep, (UNISOM) 25 MG tablet Take 25 mg by mouth at bedtime as needed.     Elagolix Sodium (ORILISSA) 150 MG TABS Take 1 tablet (150 mg total) by mouth daily. 30 tablet 11   famotidine (PEPCID) 40 MG tablet Take 1 tablet (40 mg total) by mouth at bedtime. 30 tablet 5   folic acid (FOLVITE) 400 MCG tablet Take 800 mcg by mouth daily.     folic acid (FOLVITE) 800 MCG tablet Take 800 mcg by mouth daily. 800 mcg     ipratropium (ATROVENT) 0.03 % nasal spray Place 2 sprays into both nostrils every 12 (twelve) hours. 30 mL 5   linaclotide (LINZESS) 290 MCG CAPS capsule Take 1 capsule (290 mcg total) by mouth daily before breakfast. 30 capsule 3   metoprolol succinate (TOPROL-XL) 50 MG 24 hr tablet Take 1 tablet (50 mg total) by mouth daily. 90 tablet 3   naproxen (NAPROSYN) 500 MG tablet Take 1 tablet (500 mg total) by mouth daily as needed (take with benadryll). 90 tablet 3   norethindrone (AYGESTIN) 5 MG tablet Take 1 tablet (5 mg total) by mouth daily. 90 tablet 3   pantoprazole (  PROTONIX) 40 MG tablet Take 1 tablet (40 mg total) by mouth daily. No refills - needs f/up with PCP or GI 90 tablet 3   rosuvastatin (CRESTOR) 20 MG tablet Take 1 tablet (20 mg total) by mouth daily. 90 tablet 1   Current Facility-Administered Medications  Medication Dose Route Frequency Provider Last Rate Last Admin   lidocaine (XYLOCAINE) 1 % (with pres) injection 6 mL  6 mL Other Once         Allergies as of 10/31/2023 - Review Complete 10/31/2023  Allergen Reaction Noted   Budesonide-formoterol fumarate Anaphylaxis and Other (See Comments) 08/19/2013   Fluogen [influenza virus vaccine] Hives 08/19/2022   Astelin  [azelastine]  10/12/2017   Gabapentin Nausea And Vomiting 11/12/2014   Nortriptyline Other (See Comments) 06/23/2015   Lamictal [lamotrigine]  09/15/2020   Milnacipran Other (See Comments) 04/24/2014   Tegaderm ag mesh [silver] Itching 09/25/2019   Diclofenac Other (See Comments) 08/19/2013   Duloxetine hcl Other (See Comments) 08/19/2013   Montelukast sodium Other (See Comments) 08/19/2013   Mupirocin Other (See Comments) 10/06/2014   Naproxen Other (See Comments) and Rash 11/06/2007   Naproxen sodium Rash 04/23/2015   Other Rash 02/06/2007   Oxycodone hcl Rash 04/04/2018   Sulfa antibiotics Hives, Rash, and Itching 02/06/2007    Past Medical History:  Diagnosis Date   ADHD (attention deficit hyperactivity disorder)    B12 deficiency    Bell's palsy    right sided   Chest pain    a. 05/2015 ETT: elev HR/BP, but no acute ST/T changes; b. 09/2019 Cor CTA: Nl cors, Ca2+ = 0.   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   Fatigue    Fatty liver    Fibromyalgia    GERD (gastroesophageal reflux disease)    Headache    High triglycerides    Hyperglycemia    Hypertension    Hyperthyroidism    Moderate asthma    Obesity    Paresthesia    Premature birth     Past Surgical History:  Procedure Laterality Date   BARTHOLIN GLAND CYST REMOVAL     COCHLEAR IMPLANT     COCHLEAR IMPLANT REMOVAL     DILATATION AND CURETTAGE/HYSTEROSCOPY WITH MINERVA N/A 03/05/2021   Procedure: DILATATION AND CURETTAGE/HYSTEROSCOPY WITH MINERVA;  Surgeon: Hildred Laser, MD;  Location: [facility] ORS;  Service: Gynecology;  Laterality: N/A;   LAPAROSCOPIC TUBAL LIGATION Bilateral 03/26/2018   Procedure: LAPAROSCOPIC TUBAL LIGATION;  Surgeon: Hildred Laser, MD;  Location: [facility] ORS;  Service: Gynecology;  Laterality: Bilateral;  with peritoneal biopsies   LAPAROSCOPY     MYRINGOTOMY WITH TUBE PLACEMENT     TUBAL LIGATION   03/26/2018    Review of Systems:    All systems reviewed and negative except where noted in HPI.   Physical Examination:   BP 121/79   Pulse 91   Temp 97.8 F (36.6 C) (Oral)   Wt 189 lb (85.7 kg)   BMI 33.48 kg/m   General: Well-nourished, well-developed in no acute distress.  Neuro: Alert and oriented x 3.  Grossly intact.  Psych: Alert and cooperative, normal mood and affect.   Imaging Studies: MR LUMBAR SPINE WO CONTRAST  Result Date: 10/30/2023 CLINICAL DATA:  Provided history: Right-sided numbness. Right-sided weakness. Myelopathy, acute, lumbar spine. Additional history provided by the scanning technologist: The patient reports back pain. EXAM: MRI LUMBAR SPINE  WITHOUT CONTRAST TECHNIQUE: Multiplanar, multisequence MR imaging of the lumbar spine was performed. No intravenous contrast was administered. COMPARISON:  Lumbar spine radiographs 03/15/2023. Lumbar spine MRI 07/25/2016. CT abdomen/pelvis 11/24/2016. FINDINGS: Segmentation: For the purposes of this dictation, there are 5 lumbar vertebrae and the caudal most well-formed intervertebral disc space is designated L5-S1. Correlating with the prior CT abdomen/pelvis of 11/24/2016, hypoplastic ribs are present bilaterally at the T12 level. Alignment:  No significant spondylolisthesis. Vertebrae: Lumbar vertebral body height is maintained. No significant marrow edema or focal worrisome marrow lesion. Conus medullaris and cauda equina: Conus extends to the L1 level. No signal abnormality identified within the visualized distal spinal cord. Paraspinal and other soft tissues: No acute finding within included portions of the abdomen/retroperitoneum. No paraspinal mass or collection. Disc levels: Unless otherwise stated, the level by level findings below have not significantly changed from the prior MRI of 07/25/2016. Intervertebral disc height and hydration are preserved throughout the lumbar spine. T12-L1: No significant disc herniation or  stenosis. L1-L2: No significant disc herniation or stenosis. L2-L3: No significant disc herniation or stenosis. L3-L4: Mild facet hypertrophy. No significant disc herniation or stenosis. L4-L5: Slight disc bulge, new from the prior MRI. Mild facet and ligamentum flavum hypertrophy. Mild relative left subarticular narrowing (without nerve root impingement). Central canal patent. No significant foraminal stenosis. L5-S1: No significant disc herniation or stenosis. IMPRESSION: 1. Comparison is made to the prior lumbar spine MRI of 07/25/2016. 2. At L4-L5, there is a slight disc bulge which is new from the prior MRI. Mild facet and ligamentum flavum hypertrophy. Mild relative left subarticular narrowing (without nerve root impingement). No significant foraminal stenosis. 3. No significant disc degeneration, disc herniation, spinal canal stenosis or foraminal narrowing at the remaining lumbar levels. 4. Mild L4-L5 facet hypertrophy. Electronically Signed   By: Jackey Loge D.O.   On: 10/30/2023 17:59   MR BRAIN WO CONTRAST  Result Date: 10/28/2023 CLINICAL DATA:  Initial evaluation for neuro deficit, stroke suspected. Headaches. EXAM: MRI HEAD WITHOUT CONTRAST TECHNIQUE: Multiplanar, multiecho pulse sequences of the brain and surrounding structures were obtained without intravenous contrast. COMPARISON:  Prior study from 09/09/2014 FINDINGS: Brain: Cerebral volume within normal limits for age. No focal parenchymal signal abnormality. No abnormal foci of restricted diffusion to suggest acute or subacute ischemia. Gray-white matter differentiation well maintained. No encephalomalacia to suggest chronic cortical infarction or other insult. No foci of susceptibility artifact indicative of acute or chronic intracranial blood products. No mass lesion, midline shift or mass effect. Ventricles normal in size and morphology without hydrocephalus. No extra-axial fluid collection. Pituitary gland and suprasellar region within  normal limits. Vascular: Major intracranial vascular flow voids are well maintained. Skull and upper cervical spine: Craniocervical junction within normal limits. Visualized upper cervical spine demonstrates no significant finding. Bone marrow signal intensity within normal limits. No scalp soft tissue abnormality. Sinuses/Orbits: Globes and orbital soft tissues are within normal limits. Paranasal sinuses are largely clear. No significant mastoid effusion. Other: None. IMPRESSION: Normal brain MRI. No acute intracranial abnormality identified. Electronically Signed   By: Rise Mu M.D.   On: 10/28/2023 18:18   MR CERVICAL SPINE WO CONTRAST  Result Date: 10/28/2023 CLINICAL DATA:  Initial evaluation for chronic myelopathy, neck pain, numbness radiating into the right upper extremity. EXAM: MRI CERVICAL SPINE WITHOUT CONTRAST TECHNIQUE: Multiplanar, multisequence MR imaging of the cervical spine was performed. No intravenous contrast was administered. COMPARISON:  Prior radiograph from 03/15/2023 and MRI from 07/08/2015. FINDINGS: Alignment: Straightening of the normal  cervical lordosis. No listhesis. Vertebrae: Vertebral body height maintained without acute or chronic fracture. Bone marrow signal intensity within normal limits. No discrete or worrisome osseous lesions or abnormal marrow edema. Cord: Normal signal and morphology. Posterior Fossa, vertebral arteries, paraspinal tissues: Craniocervical junction within normal limits. Paraspinous soft tissues normal. Strongly dominant left vertebral artery with diffusely hypoplastic right vertebral artery noted. Disc levels: C2-C3: Small central disc protrusion mildly indents the ventral thecal sac. No spinal stenosis. Foramina remain patent. C3-C4: Mild disc bulge with right-sided uncovertebral spurring. No spinal stenosis. Moderate right C4 foraminal narrowing. Left neural foramina remains patent. C4-C5: Minimal annular bulge with uncovertebral spurring.  No spinal stenosis. Mild right C5 foraminal narrowing. Left neural foramen remains patent. C5-C6:  Unremarkable. C6-C7:  Unremarkable. C7-T1: Negative interspace. Mild facet degeneration. No canal or foraminal stenosis. IMPRESSION: 1. Normal MRI appearance of the cervical spinal cord. 2. Right-sided uncovertebral spurring at C3-4 with resultant moderate right C4 foraminal stenosis. 3. Additional mild right C5 foraminal narrowing related to uncovertebral disease. Electronically Signed   By: Rise Mu M.D.   On: 10/28/2023 18:11    Assessment and Plan:   Yesenia Wood is a 37 y.o. y/o female returns for f/u of:  1.  GERD - improved on pantoprazole 40 Mg and famotidine 40 Mg daily.  Continue pantoprazole 40 Mg daily.  Stop famotidine.  Continue avoiding GERD trigger foods and drinks.  2.  Irritable bowel syndrome with chronic constipation; improved  Decrease Linzess to 145 mcg daily.  Linzess 290 is causing stools to be too loose.  Continue high-fiber diet, exercise, 64 ounces of fluids daily.  Celso Amy, PA-C  Follow up in 1 year or sooner if recurrent GI symptoms.

## 2023-10-31 ENCOUNTER — Ambulatory Visit: Payer: Medicare Other | Admitting: Physician Assistant

## 2023-10-31 ENCOUNTER — Encounter: Payer: Self-pay | Admitting: Physician Assistant

## 2023-10-31 VITALS — BP 121/79 | HR 91 | Temp 97.8°F | Wt 189.0 lb

## 2023-10-31 DIAGNOSIS — K581 Irritable bowel syndrome with constipation: Secondary | ICD-10-CM

## 2023-10-31 DIAGNOSIS — K219 Gastro-esophageal reflux disease without esophagitis: Secondary | ICD-10-CM | POA: Diagnosis not present

## 2023-10-31 MED ORDER — LINACLOTIDE 145 MCG PO CAPS: 145.0000 ug | ORAL_CAPSULE | Freq: Every day | ORAL | 3 refills | Status: DC

## 2023-10-31 MED ORDER — PANTOPRAZOLE SODIUM 40 MG PO TBEC: 40.0000 mg | DELAYED_RELEASE_TABLET | Freq: Every day | ORAL | 3 refills | Status: DC

## 2023-11-08 ENCOUNTER — Encounter: Payer: Self-pay | Admitting: Physical Medicine and Rehabilitation

## 2023-11-08 ENCOUNTER — Encounter
Payer: Medicare Other | Attending: Physical Medicine and Rehabilitation | Admitting: Physical Medicine and Rehabilitation

## 2023-11-08 VITALS — BP 118/82 | HR 85 | Ht 63.0 in | Wt 188.8 lb

## 2023-11-08 DIAGNOSIS — M797 Fibromyalgia: Secondary | ICD-10-CM | POA: Diagnosis not present

## 2023-11-08 DIAGNOSIS — M7918 Myalgia, other site: Secondary | ICD-10-CM | POA: Diagnosis not present

## 2023-11-08 NOTE — Patient Instructions (Addendum)
-   Resume Usual Activities. Notify Physician of any unusual bleeding, erythema or concern for side effects as reviewed above. - Apply ice prn for pain - Tylenol prn for pain - Follow up Q35m PRN for injections  - She is taking naproxen 2-3x weekly PRN; ok with this while monitorring her GI symptomsions

## 2023-11-08 NOTE — Progress Notes (Signed)
HPI: Yesenia Wood is a 37 y.o. female with PMHx has Deaf; B12 deficiency; Cochlear implant status; Fatty infiltration of liver; Gastro-esophageal reflux disease without esophagitis; Generalized headache; Right-sided Bell's palsy; Hypertriglyceridemia; Irregular menstrual cycle; Overweight; Paresthesia; Disorder of labyrinth; Palpitations; Sleep disturbance; Chronic migraine without aura; Dissocial personality disorder (HCC); OSA (obstructive sleep apnea); Insomnia due to mental condition; Muteness; MDD (major depressive disorder), recurrent episode, moderate (HCC); Social anxiety disorder; Allergic rhinitis; Bartholin gland cyst; Migraine; Obesity (BMI 30-39.9); RLS (restless legs syndrome); Right sided numbness; Sleep difficulties; Positive ANA (antinuclear antibody); Fibromyalgia; Dyslipidemia; Vitamin D deficiency; Nausea; Chronic pain syndrome; Sprain of left ankle; and Acute right ankle pain on their problem list. who presents to clinic for treatment of pain related to myofascial pain  via injection as described below.    MRI thoracic pending - only findings so far  joint degeneration/spurring resulting in moderate right C4 foraminal stenosis and mild right C5 foraminal narrowing. Mild L4-5 discbulge, facet hypertrophy, and L subarticular impingement.   She remains with weakness and pain mostly on her right side diffusely. No work currently. Does pilates and stationary bike at home, and walks about 20 minutes a day; she tries to exercise one hour a day. Her walking will sometimes cause flares but overall can bike with no problems.   Recently her sleep has bene better; she got a color changing light to help with daytime alertness and does fele like the transition to sleep is much easier with this. She gets about 4-8 hours a night at this point.      Physical Exam:  General: Appropriate appearance for age.  Mental Status: Appropriate mood and affect.  Cardiovascular: RRR, no m/r/g.  Respiratory:  CTAB, no rales/rhonchi/wheezing.  Skin: No apparent rashes or lesions.  Neuro: Awake, alert, and oriented x3. No apparent deficits.  + Sensation to light touch decreased diffusely on the right arm and leg-unchanged Coordination: No deficits in fine motor or ataxia noted.    MSK: Moving all 4 limbs antigravity and against resistance; 4/5 throughout + TTP with palpable trigger points in bilateral cervical/thoracic/lumbarparaspinals, trapezius muscles, levator scapulae, rhomboids, and quadratus/psoas.     PROCEDURE:  Bilateral  trigger point injections Diagnosis:      ICD-10-CM    1. Myofascial pain  M79.18 lidocaine (XYLOCAINE) 1 % (with pres) injection 6 mL      triamcinolone acetonide (KENALOG-40) injection 5.2 mg     2. Fibromyalgia  M79.7 lidocaine (XYLOCAINE) 1 % (with pres) injection 6 mL      triamcinolone acetonide (KENALOG-40) injection 5.2 mg       Goals with treatment: [ X ] Decrease pain [  ] Improve Active / Passive ROM [ x ] Improve ADLs [ ]  Improve functional mobility   MEDICATION:  [ x ] Kenalog 40 mg/mL  [ X ] Lidocaine 1%      CONSENT: Obtained in writing per policy. Consent uploaded to chart.   Benefits discussed.  Risks discussed included, but were not limited to, pain and discomfort, bleeding, bruising, allergic reaction, infection. All questions answered to patient/family member/guardian/ caregiver satisfaction. They would like to proceed with procedure. There are no noted contraindications to procedure.   PROCEDURE Time out was preformed No heat sources No antibiotics   The patient was explained about both the benefits and risks of a Bilateral  trigger point injections. After the patient acknowledged an understanding of the risks and benefits, the patient agreed to proceed. The area was first marked and then  prepped in an aseptic fashion with betadine / alcohol. A 30 g, 1/2 inch needle was directed via a direct approach into the bilateral cervical  paraspinals, trapezius muscles, thoracic paraspinals, rhomboids, quadratus and lumbar paraspinal muscles. The injection was completed with Kenalog 40 mg/ml 0.2 cc mixed with 6 cc of 1% lidocaine after no blood was aspirated on pull back.   No complications were encountered. The patient tolerated the procedure well.   Impression: HPI: Yesenia Wood is a 37 y.o. female with PMHx has Deaf; B12 deficiency; Cochlear implant status; Fatty infiltration of liver; Gastro-esophageal reflux disease without esophagitis; Generalized headache; Right-sided Bell's palsy; Hypertriglyceridemia; Irregular menstrual cycle; Overweight; Paresthesia; Disorder of labyrinth; Palpitations; Sleep disturbance; Chronic migraine without aura; Dissocial personality disorder (HCC); OSA (obstructive sleep apnea); Insomnia due to mental condition; Muteness; MDD (major depressive disorder), recurrent episode, moderate (HCC); Social anxiety disorder; Allergic rhinitis; Bartholin gland cyst; Migraine; Obesity (BMI 30-39.9); RLS (restless legs syndrome); Right sided numbness; Sleep difficulties; Positive ANA (antinuclear antibody); Fibromyalgia; Dyslipidemia; Vitamin D deficiency; Nausea; Chronic pain syndrome; Sprain of left ankle; and Acute right ankle pain on their problem list. who presents to clinic for treatment of myofascial pain . They received a  Bilateral  trigger point injections as above.   PLAN:  Myofascial pain - Resume Usual Activities. Notify Physician of any unusual bleeding, erythema or concern for side effects as reviewed above. - Apply ice prn for pain - Tylenol prn for pain - Follow up Q56m PRN for injections  - She is taking naproxen 2-3x weekly PRN; ok with this while monitorring her GI symptoms  -Reviewed results of MRIs with patient as above, emphasized it is reassuring that there are no findings to indicate urgent surgical evaluation.

## 2023-11-13 ENCOUNTER — Encounter: Payer: Self-pay | Admitting: Physician Assistant

## 2023-11-13 ENCOUNTER — Telehealth: Payer: Self-pay

## 2023-11-13 MED ORDER — LINACLOTIDE 290 MCG PO CAPS: 290.0000 ug | ORAL_CAPSULE | Freq: Every day | ORAL | 3 refills | Status: DC

## 2023-11-13 NOTE — Telephone Encounter (Signed)
Spoke with patient and Linzess 290 mcg sent to pharmacy.

## 2023-11-15 DIAGNOSIS — M791 Myalgia, unspecified site: Secondary | ICD-10-CM | POA: Diagnosis not present

## 2023-11-15 DIAGNOSIS — G43109 Migraine with aura, not intractable, without status migrainosus: Secondary | ICD-10-CM | POA: Diagnosis not present

## 2023-11-15 DIAGNOSIS — G518 Other disorders of facial nerve: Secondary | ICD-10-CM | POA: Diagnosis not present

## 2023-11-15 DIAGNOSIS — G43719 Chronic migraine without aura, intractable, without status migrainosus: Secondary | ICD-10-CM | POA: Diagnosis not present

## 2023-11-15 DIAGNOSIS — M542 Cervicalgia: Secondary | ICD-10-CM | POA: Diagnosis not present

## 2023-11-27 ENCOUNTER — Other Ambulatory Visit: Payer: Self-pay | Admitting: Medical Genetics

## 2023-11-30 ENCOUNTER — Encounter: Payer: Self-pay | Admitting: Physical Medicine and Rehabilitation

## 2023-12-01 ENCOUNTER — Other Ambulatory Visit
Admission: RE | Admit: 2023-12-01 | Discharge: 2023-12-01 | Disposition: A | Discharge status: Home / Self Care | Payer: Self-pay | Source: Ambulatory Visit | Attending: Medical Genetics | Admitting: Medical Genetics

## 2023-12-01 ENCOUNTER — Telehealth: Payer: Self-pay | Admitting: Family Medicine

## 2023-12-01 NOTE — Telephone Encounter (Signed)
 Copied from CRM [id number]. Topic: Appointment Scheduling - Scheduling Inquiry for Clinic >> Dec 01, 2023  1:39 PM Franchot Heidelberg wrote: Reason for CRM: Pt needs to reschedule her MWV in August, please advise

## 2023-12-13 ENCOUNTER — Ambulatory Visit: Payer: Medicare Other | Admitting: Physical Medicine and Rehabilitation

## 2023-12-13 ENCOUNTER — Encounter
Payer: Medicare Other | Attending: Physical Medicine and Rehabilitation | Admitting: Physical Medicine and Rehabilitation

## 2023-12-13 VITALS — BP 134/87 | HR 96 | Ht 63.0 in | Wt 190.0 lb

## 2023-12-13 DIAGNOSIS — G894 Chronic pain syndrome: Secondary | ICD-10-CM | POA: Diagnosis not present

## 2023-12-13 DIAGNOSIS — M7918 Myalgia, other site: Secondary | ICD-10-CM | POA: Diagnosis not present

## 2023-12-13 DIAGNOSIS — M797 Fibromyalgia: Secondary | ICD-10-CM | POA: Insufficient documentation

## 2023-12-13 MED ORDER — TRAMADOL HCL 50 MG PO TABS: 50.0000 mg | ORAL_TABLET | Freq: Every day | ORAL | 0 refills | Status: DC | PRN

## 2023-12-13 MED ORDER — LIDOCAINE HCL 1 % IJ SOLN
6.0000 mL | Freq: Once | INTRAMUSCULAR | Status: AC
  Administered 2023-12-13: 6 mL

## 2023-12-13 MED ORDER — TRIAMCINOLONE ACETONIDE 40 MG/ML IJ SUSP
5.0000 mg | Freq: Once | INTRAMUSCULAR | Status: AC
  Administered 2023-12-13: 5.2 mg via INTRAMUSCULAR

## 2023-12-13 NOTE — Progress Notes (Signed)
HPI: Yesenia Wood is a 38 y.o. female with PMHx has Deaf; B12 deficiency; Cochlear implant status; Fatty infiltration of liver; Gastro-esophageal reflux disease without esophagitis; Generalized headache; Right-sided Bell's palsy; Hypertriglyceridemia; Irregular menstrual cycle; Overweight; Paresthesia; Disorder of labyrinth; Palpitations; Sleep disturbance; Chronic migraine without aura; Dissocial personality disorder (HCC); OSA (obstructive sleep apnea); Insomnia due to mental condition; Muteness; MDD (major depressive disorder), recurrent episode, moderate (HCC); Social anxiety disorder; Allergic rhinitis; Bartholin gland cyst; Migraine; Obesity (BMI 30-39.9); RLS (restless legs syndrome); Right sided numbness; Sleep difficulties; Positive ANA (antinuclear antibody); Fibromyalgia; Dyslipidemia; Vitamin D deficiency; Nausea; Chronic pain syndrome; Sprain of left ankle; and Acute right ankle pain on their problem list. who presents to clinic for treatment of pain related to myofascial pain  via injection as described below.   Getting naps in the afternoon and getting about 6-7 hours of sleep per night, waking up every few hours. She had to stop benadryll recently and was switched to flonase. She is tolerating the naprosyn PRN without the benadryll and without stomach problems 2-3x per week.   Since last visit, she get a print out for a test that was done 5 years ago showing she is polymorphic for MTHFR gene. She takes a folate supplement. She is unsure why this was done or how it effects her.   She has been using a cane for low back pain.    Physical Exam:  General: Appropriate appearance for age.  Mental Status: Appropriate mood and affect.  Cardiovascular: RRR, no m/r/g.  Respiratory: CTAB, no rales/rhonchi/wheezing.  Skin: No apparent rashes or lesions.  Neuro: Awake, alert, and oriented x3. No apparent deficits.  + Sensation to light touch decreased diffusely on the right arm and  leg-unchanged Coordination: No deficits in fine motor or ataxia noted.    MSK: Moving all 4 limbs antigravity and against resistance; 4/5 throughout + TTP with palpable trigger points in bilateral cervical/thoracic/lumbarparaspinals, trapezius muscles, levator scapulae, rhomboids, and quadratus/psoas muscles     PROCEDURE:  Bilateral  trigger point injections Diagnosis:      ICD-10-CM    1. Myofascial pain  M79.18 lidocaine (XYLOCAINE) 1 % (with pres) injection 6 mL      triamcinolone acetonide (KENALOG-40) injection 5.2 mg     2. Fibromyalgia  M79.7 lidocaine (XYLOCAINE) 1 % (with pres) injection 6 mL      triamcinolone acetonide (KENALOG-40) injection 5.2 mg       Goals with treatment: [ X ] Decrease pain [  ] Improve Active / Passive ROM [ x ] Improve ADLs [ ]  Improve functional mobility   MEDICATION:  [ x ] Kenalog 40 mg/mL  [ X ] Lidocaine 1%      CONSENT: Obtained in writing per policy. Consent uploaded to chart.   Benefits discussed.  Risks discussed included, but were not limited to, pain and discomfort, bleeding, bruising, allergic reaction, infection. All questions answered to patient/family member/guardian/ caregiver satisfaction. They would like to proceed with procedure. There are no noted contraindications to procedure.   PROCEDURE Time out was preformed No heat sources No antibiotics   The patient was explained about both the benefits and risks of a Bilateral  trigger point injections. After the patient acknowledged an understanding of the risks and benefits, the patient agreed to proceed. The area was first marked and then prepped in an aseptic fashion with betadine / alcohol. A 30 g, 1/2 inch needle was directed via a direct approach into the bilateral cervical paraspinals, trapezius muscles,  thoracic paraspinals, rhomboids, quadratus and lumbar paraspinal muscles. The injection was completed with Kenalog 40 mg/ml 0.2 cc mixed with 6 cc of 1% lidocaine after no  blood was aspirated on pull back.   No complications were encountered. The patient tolerated the procedure well.   Impression: HPI: Yesenia Wood is a 38 y.o. female with PMHx has Deaf; B12 deficiency; Cochlear implant status; Fatty infiltration of liver; Gastro-esophageal reflux disease without esophagitis; Generalized headache; Right-sided Bell's palsy; Hypertriglyceridemia; Irregular menstrual cycle; Overweight; Paresthesia; Disorder of labyrinth; Palpitations; Sleep disturbance; Chronic migraine without aura; Dissocial personality disorder (HCC); OSA (obstructive sleep apnea); Insomnia due to mental condition; Muteness; MDD (major depressive disorder), recurrent episode, moderate (HCC); Social anxiety disorder; Allergic rhinitis; Bartholin gland cyst; Migraine; Obesity (BMI 30-39.9); RLS (restless legs syndrome); Right sided numbness; Sleep difficulties; Positive ANA (antinuclear antibody); Fibromyalgia; Dyslipidemia; Vitamin D deficiency; Nausea; Chronic pain syndrome; Sprain of left ankle; and Acute right ankle pain on their problem list. who presents to clinic for treatment of myofascial pain . They received a  Bilateral  trigger point injections as above.   PLAN:  Myofascial pain - Resume Usual Activities. Notify Physician of any unusual bleeding, erythema or concern for side effects as reviewed above. - Apply ice prn for pain - Tylenol prn for pain - Follow up Q72m PRN for injections  - Continue Naprosyn as needed and tramadol #10 tabs for this month for severe pain. We will re-address need for tramadol at your follow up in 1 month  - Follow up with your PCP regarding adjustments in folate that may be needed as a result of your genetic tests

## 2023-12-13 NOTE — Patient Instructions (Addendum)
 Continue Naprosyn  as needed and tramadol  #10 tabs for this month for severe pain. We will re-address need for tramadol  at your follow up in 1 month  Follow up with your PCP regarding adjustments in folate that may be needed as a result of your genetic tests

## 2023-12-15 ENCOUNTER — Encounter: Payer: Self-pay | Admitting: Family Medicine

## 2023-12-15 ENCOUNTER — Ambulatory Visit (INDEPENDENT_AMBULATORY_CARE_PROVIDER_SITE_OTHER): Payer: Medicare Other | Admitting: Family Medicine

## 2023-12-15 ENCOUNTER — Telehealth: Payer: Self-pay | Admitting: Family Medicine

## 2023-12-15 VITALS — BP 110/74 | HR 85 | Resp 16 | Ht 63.0 in | Wt 189.7 lb

## 2023-12-15 DIAGNOSIS — M797 Fibromyalgia: Secondary | ICD-10-CM

## 2023-12-15 DIAGNOSIS — R42 Dizziness and giddiness: Secondary | ICD-10-CM | POA: Diagnosis not present

## 2023-12-15 DIAGNOSIS — G894 Chronic pain syndrome: Secondary | ICD-10-CM | POA: Diagnosis not present

## 2023-12-15 DIAGNOSIS — M62838 Other muscle spasm: Secondary | ICD-10-CM | POA: Diagnosis not present

## 2023-12-15 DIAGNOSIS — R200 Anesthesia of skin: Secondary | ICD-10-CM | POA: Diagnosis not present

## 2023-12-15 MED ORDER — LANCETS MISC. MISC: 1.0000 | Freq: Three times a day (TID) | 0 refills | Status: DC

## 2023-12-15 MED ORDER — BLOOD GLUCOSE TEST VI STRP: 1.0000 | ORAL_STRIP | Freq: Three times a day (TID) | 0 refills | Status: DC

## 2023-12-15 MED ORDER — LANCET DEVICE MISC: 1.0000 | Freq: Three times a day (TID) | 0 refills | Status: DC

## 2023-12-15 MED ORDER — BLOOD GLUCOSE MONITORING SUPPL DEVI: 1.0000 | Freq: Three times a day (TID) | 0 refills | Status: DC

## 2023-12-15 NOTE — Telephone Encounter (Signed)
[facility] appt for 07/05/24 need to be rescheduled please give pt a call

## 2023-12-15 NOTE — Progress Notes (Signed)
Name: Yesenia Wood   MRN: [id number]    DOB: 11/10/1986   Date:12/15/2023       Progress Note  Subjective  Chief Complaint  Chief Complaint  Patient presents with   Dizziness    Onset since 2-3 months ago, randomly getting worst   Spasms    Onset for a few months, all over body and last upto 10 mins   Back Pain    Mid/lower back-while walking and moving around   HPI   Interpreter present though video  Patient came in with chief concerned of dizziness, increase in muscle spasms and back pain  She is under the care of PMR and had MRI brain ( due to new onset right side weakness and decrease in sensation ) MR thoracic and lumbar spine that showed some DDD and disc bulging. She has been using a pain to control symptoms and has pain medication ( has not picked it up yet) and takes nsaid's prn - also does not like to take it daily. Asked about EMG / NCS and that has not been doing yet.   Dizziness is going on for months, but getting a little worse, happens during activity or rest, has also happened two hours after meals? BP and heart rate normal when episodes occurs. Denies spinning sensation, describe episodes of lightheadedness and improves when she sits down.  .  Patient Active Problem List   Diagnosis Date Noted   Myofascial pain 10/04/2023   Right sided weakness 10/04/2023   Sprain of left ankle 05/01/2023   Chronic pain syndrome 03/03/2023   Nausea 08/12/2022   Dyslipidemia 02/10/2022   Vitamin D deficiency 02/10/2022   Positive ANA (antinuclear antibody) 12/20/2021   Fibromyalgia 12/20/2021   Sleep difficulties 04/30/2020   Right sided numbness 12/26/2019   RLS (restless legs syndrome) 09/30/2019   MDD (major depressive disorder), recurrent episode, moderate (HCC) 07/17/2019   Social anxiety disorder 07/17/2019   Muteness 09/10/2018   OSA (obstructive sleep apnea) 10/10/2016   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   Dissocial personality disorder (HCC) 06/07/2016    Sleep disturbance 05/10/2016   Chronic migraine without aura 03/29/2016   Palpitations 06/09/2015   Deaf 05/16/2015   B12 deficiency 05/16/2015   Cochlear implant status 05/16/2015   Fatty infiltration of liver 05/16/2015   Gastro-esophageal reflux disease without esophagitis 05/16/2015   Generalized headache 05/16/2015   Right-sided Bell's palsy 05/16/2015   Hypertriglyceridemia 05/16/2015   Irregular menstrual cycle 05/16/2015   Overweight 05/16/2015   Paresthesia 05/16/2015   Disorder of labyrinth 05/16/2015   Migraine 08/05/2013   Allergic rhinitis 07/01/2013   Obesity (BMI 30-39.9) 07/01/2013   Bartholin gland cyst 09/16/2008    Past Surgical History:  Procedure Laterality Date   BARTHOLIN GLAND CYST REMOVAL     COCHLEAR IMPLANT     COCHLEAR IMPLANT REMOVAL     DILATATION AND CURETTAGE/HYSTEROSCOPY WITH MINERVA N/A 03/05/2021   Procedure: DILATATION AND CURETTAGE/HYSTEROSCOPY WITH MINERVA;  Surgeon: Hildred Laser, MD;  Location: [facility];  Service: Gynecology;  Laterality: N/A;   LAPAROSCOPIC TUBAL LIGATION Bilateral 03/26/2018   Procedure: LAPAROSCOPIC TUBAL LIGATION;  Surgeon: Hildred Laser, MD;  Location: [facility];  Service: Gynecology;  Laterality: Bilateral;  with peritoneal biopsies   LAPAROSCOPY     MYRINGOTOMY WITH TUBE PLACEMENT     TUBAL LIGATION  03/26/2018    Family History  Adopted: Yes  Family history unknown: Yes    Social History   Tobacco Use   Smoking status:  Former    Current packs/day: 0.00    Average packs/day: 1 pack/day for 2.0 years (2.0 ttl pk-yrs)    Types: Cigarettes    Start date: 11/28/2004    Quit date: 11/28/2006    Years since quitting: 17.0   Smokeless tobacco: Never  Substance Use Topics   Alcohol use: Yes    Comment: 1 every 3 months     Current Outpatient Medications:    BOTOX 100 units SOLR injection, Inject 100 Units into the muscle every 3 (three) months., Disp: , Rfl:    Cholecalciferol (VITAMIN D) 50 MCG (2000 UT)  CAPS, Take 1 capsule by mouth daily., Disp: , Rfl:    Cyanocobalamin (VITAMIN B-12) 500 MCG SUBL, Place 1 tablet (500 mcg total) under the tongue once a week., Disp: 30 tablet, Rfl: 0   doxylamine, Sleep, (UNISOM) 25 MG tablet, Take 25 mg by mouth at bedtime as needed., Disp: , Rfl:    Elagolix Sodium (ORILISSA) 150 MG TABS, Take 1 tablet (150 mg total) by mouth daily., Disp: 30 tablet, Rfl: 11   fluticasone (FLONASE) 50 MCG/ACT nasal spray, Place 1 spray into both nostrils daily., Disp: , Rfl:    folic acid (FOLVITE) 400 MCG tablet, Take 800 mcg by mouth daily., Disp: , Rfl:    ipratropium (ATROVENT) 0.03 % nasal spray, Place 2 sprays into both nostrils every 12 (twelve) hours., Disp: 30 mL, Rfl: 5   linaclotide (LINZESS) 145 MCG CAPS capsule, Take 1 capsule (145 mcg total) by mouth daily before breakfast. (Patient taking differently: Take 290 mcg by mouth daily before breakfast.), Disp: 90 capsule, Rfl: 3   linaclotide (LINZESS) 290 MCG CAPS capsule, Take 1 capsule (290 mcg total) by mouth daily before breakfast., Disp: 30 capsule, Rfl: 3   loratadine (CLARITIN) 10 MG tablet, Take 10 mg by mouth daily., Disp: , Rfl:    metoprolol succinate (TOPROL-XL) 50 MG 24 hr tablet, Take 1 tablet (50 mg total) by mouth daily., Disp: 90 tablet, Rfl: 3   naproxen (NAPROSYN) 500 MG tablet, Take 1 tablet (500 mg total) by mouth daily as needed (take with benadryll)., Disp: 90 tablet, Rfl: 3   norethindrone (AYGESTIN) 5 MG tablet, Take 1 tablet (5 mg total) by mouth daily., Disp: 90 tablet, Rfl: 3   pantoprazole (PROTONIX) 40 MG tablet, Take 1 tablet (40 mg total) by mouth daily. No refills - needs f/up with PCP or GI, Disp: 90 tablet, Rfl: 3   rosuvastatin (CRESTOR) 20 MG tablet, Take 1 tablet (20 mg total) by mouth daily., Disp: 90 tablet, Rfl: 1   traMADol (ULTRAM) 50 MG tablet, Take 1 tablet (50 mg total) by mouth daily as needed for severe pain (pain score 7-10)., Disp: 10 tablet, Rfl: 0   dicyclomine (BENTYL)  10 MG capsule, TAKE 1 CAPSULE (10 MG TOTAL) BY MOUTH 3 (THREE) TIMES DAILY AS NEEDED FOR SPASMS. (Patient not taking: Reported on 11/08/2023), Disp: 270 capsule, Rfl: 1  Current Facility-Administered Medications:    lidocaine (XYLOCAINE) 1 % (with pres) injection 6 mL, 6 mL, Other, Once,   Allergies  Allergen Reactions   Budesonide-Formoterol Fumarate Anaphylaxis and Other (See Comments)    Other reaction(s): Other (See Comments) chest pain Chest pain chest pain   Fluogen [Influenza Virus Vaccine] Hives    rash   Astelin [Azelastine]     Tingling, numbness, nausea   Gabapentin Nausea And Vomiting   Nortriptyline Other (See Comments)    Other reaction(s): Other (See Comments)   Lamictal [  Lamotrigine]     Self injurious thoughts   Milnacipran Other (See Comments)    Pressure in eyes   Tegaderm Ag Mesh [Silver] Itching   Diclofenac Other (See Comments)    Other reaction(s): Other (See Comments)   Duloxetine Hcl Other (See Comments)    Other reaction(s): Other (See Comments) Other reaction(s): unknown   Montelukast Sodium Other (See Comments)    Other reaction(s): wheezing/sob   Mupirocin Other (See Comments)   Naproxen Other (See Comments) and Rash    Other reaction(s): rash/itching   Naproxen Sodium Rash    mild rash   Other Rash   Oxycodone Hcl Rash   Sulfa Antibiotics Hives, Rash and Itching    I personally reviewed active problem list, medication list, allergies with the patient/caregiver today.   ROS  Ten systems reviewed and is negative except as mentioned in HPI    Objective  Vitals:   12/15/23 1112  BP: 110/74  Pulse: 85  Resp: 16  SpO2: 99%  Weight: 189 lb 11.2 oz (86 kg)  Height: 5\' 3"  (1.6 m)    Body mass index is 33.6 kg/m.  Physical Exam  Constitutional: Patient appears well-developed and well-nourished. Obese  No distress.  HEENT: head atraumatic, normocephalic Cardiovascular: Normal rate, regular rhythm and normal heart sounds.  No murmur  heard. No BLE edema. Pulmonary/Chest: Effort normal and breath sounds normal. No respiratory distress. Abdominal: Soft.  There is no tenderness. Neuro: normal cranial nerves, decrease in sensation on right side of face, upper and lower extremity, romberg negative Muscular skeletal: trigger point positive, using a quad cane Psychiatric: Patient has a normal mood and affect. behavior is normal. Judgment and thought content normal.   Recent Results (from the past 2160 hours)  Hepatic function panel     Status: None   Collection Time: 09/26/23 11:21 AM  Result Value Ref Range   Total Protein 6.9 6.0 - 8.5 g/dL   Albumin 4.5 3.9 - 4.9 g/dL   Bilirubin Total 0.3 0.0 - 1.2 mg/dL   Bilirubin, Direct <1.61 0.00 - 0.40 mg/dL   Alkaline Phosphatase 60 44 - 121 IU/L   AST 21 0 - 40 IU/L   ALT 19 0 - 32 IU/L    Diabetic Foot Exam:     PHQ2/9:    12/15/2023   11:03 AM 11/08/2023   11:27 AM 08/28/2023   11:13 AM 08/14/2023   10:08 AM 07/21/2023    2:06 PM  Depression screen PHQ 2/9  Decreased Interest 0 0 1 1 0  Down, Depressed, Hopeless 0 0 1 1 0  PHQ - 2 Score 0 0 2 2 0  Altered sleeping 0   1 0  Tired, decreased energy 0   3 0  Change in appetite 0   1 0  Feeling bad or failure about yourself  0   0 0  Trouble concentrating 0   1 0  Moving slowly or fidgety/restless 0   1 0  Suicidal thoughts 0   0 0  PHQ-9 Score 0   9 0  Difficult doing work/chores Not difficult at all   Somewhat difficult     phq 9 is negative  Fall Risk:    12/15/2023   11:02 AM 12/13/2023   10:09 AM 11/08/2023   11:26 AM 10/04/2023   11:28 AM 08/28/2023   11:13 AM  Fall Risk   Falls in the past year? 1 1 0 1 0  Number falls in past yr: 0  0 0 1 0  Injury with Fall? 1 1  0 0  Comment  bruised     Risk for fall due to : Impaired balance/gait      Follow up Falls prevention discussed;Education provided;Falls evaluation completed         Assessment & Plan  1. Dizziness (Primary)  Check glucose when  episodes occurs, keep a diary of symptoms and frequency for next visit. Write down bp and heart rate during episodes also how intense her pain was at the time, it may be secondary to intense pain. Recently had MRI brain that was normal. It does not seem to be associated with headaches - Blood Glucose Monitoring Suppl DEVI; 1 each by Does not apply route in the morning, at noon, and at bedtime. May substitute to any manufacturer covered by patient's insurance.  Dispense: 1 each; Refill: 0 - Glucose Blood (BLOOD GLUCOSE TEST STRIPS) STRP; 1 each by In Vitro route in the morning, at noon, and at bedtime. May substitute to any manufacturer covered by patient's insurance.  Dispense: 100 strip; Refill: 0 - Lancet Device MISC; 1 each by Does not apply route in the morning, at noon, and at bedtime. May substitute to any manufacturer covered by patient's insurance.  Dispense: 1 each; Refill: 0 - Lancets Misc. MISC; 1 each by Does not apply route in the morning, at noon, and at bedtime. May substitute to any manufacturer covered by patient's insurance.  Dispense: 100 each; Refill: 0  2. Fibromyalgia  Keep follow up with PMR  3. Muscle spasms of both lower extremities  Keep follow ups with PMR  4. Chronic pain syndrome  Under the care of PMR

## 2023-12-18 LAB — GENECONNECT MOLECULAR SCREEN: Genetic Analysis Overall Interpretation: NEGATIVE

## 2023-12-20 NOTE — Telephone Encounter (Signed)
I left a message for patient to call me back to reschedule [facility] appointment in August.

## 2023-12-25 ENCOUNTER — Encounter: Payer: Self-pay | Admitting: Physical Medicine and Rehabilitation

## 2024-01-01 ENCOUNTER — Encounter: Payer: Self-pay | Admitting: Cardiovascular Disease

## 2024-01-01 ENCOUNTER — Encounter: Payer: Self-pay | Admitting: Family Medicine

## 2024-01-01 ENCOUNTER — Encounter: Payer: Self-pay | Admitting: Physical Medicine and Rehabilitation

## 2024-01-02 DIAGNOSIS — M791 Myalgia, unspecified site: Secondary | ICD-10-CM | POA: Diagnosis not present

## 2024-01-02 DIAGNOSIS — G518 Other disorders of facial nerve: Secondary | ICD-10-CM | POA: Diagnosis not present

## 2024-01-02 DIAGNOSIS — G43109 Migraine with aura, not intractable, without status migrainosus: Secondary | ICD-10-CM | POA: Diagnosis not present

## 2024-01-02 DIAGNOSIS — M542 Cervicalgia: Secondary | ICD-10-CM | POA: Diagnosis not present

## 2024-01-03 ENCOUNTER — Other Ambulatory Visit: Payer: Self-pay | Admitting: Cardiovascular Disease

## 2024-01-03 ENCOUNTER — Other Ambulatory Visit: Payer: Self-pay | Admitting: Physical Medicine and Rehabilitation

## 2024-01-03 ENCOUNTER — Other Ambulatory Visit: Payer: Self-pay | Admitting: Family Medicine

## 2024-01-03 DIAGNOSIS — E785 Hyperlipidemia, unspecified: Secondary | ICD-10-CM

## 2024-01-04 NOTE — Telephone Encounter (Signed)
 Requested Prescriptions  Refused Prescriptions Disp Refills   rosuvastatin  (CRESTOR ) 20 MG tablet [Pharmacy Med Name: ROSUVASTATIN  CALCIUM  20 MG TAB] 90 tablet 1    Sig: TAKE 1 TABLET BY MOUTH EVERY DAY     Cardiovascular:  Antilipid - Statins 2 Failed - 01/04/2024  4:32 PM      Failed - Lipid Panel in normal range within the last 12 months    Cholesterol, Total  Date Value Ref Range Status  10/29/2015 192 100 - 199 mg/dL Final   Cholesterol  Date Value Ref Range Status  02/10/2023 126 <200 mg/dL Final   LDL Cholesterol (Calc)  Date Value Ref Range Status  02/10/2023 62 mg/dL (calc) Final    Comment:    Reference range: <100 . Desirable range <100 mg/dL for primary prevention;   <70 mg/dL for patients with CHD or diabetic patients  with > or = 2 CHD risk factors. SABRA LDL-C is now calculated using the Martin-Hopkins  calculation, which is a validated novel method providing  better accuracy than the Friedewald equation in the  estimation of LDL-C.  Gladis APPLETHWAITE et al. SANDREA. 7986;689(80): 2061-2068  (http://education.[employer].com/faq/FAQ164)    HDL  Date Value Ref Range Status  02/10/2023 41 (L) > OR = 50 mg/dL Final  87/98/7983 43 >60 mg/dL Final   Triglycerides  Date Value Ref Range Status  02/10/2023 142 <150 mg/dL Final         Passed - Cr in normal range and within 360 days    Creat  Date Value Ref Range Status  07/04/2023 0.80 0.50 - 0.97 mg/dL Final         Passed - Patient is not pregnant      Passed - Valid encounter within last 12 months    Recent Outpatient Visits           2 weeks ago Dizziness   [facility] Glenard Mire, MD   4 months ago Dyslipidemia   [facility] Glenard Mire, MD   5 months ago Fatty infiltration of liver   [facility] Glenard Mire, MD   6 months ago Generalized abdominal pain   [facility] Leavy Mole, PA-C    7 months ago Lower abdominal pain   [facility] Gareth Mliss FALCON, FNP       Future Appointments             In 4 days Loistine Sober, NP [facility] [place]   In 1 month Sowles, Krichna, MD [facility]   In 1 month Darron, Deatrice LABOR, MD [facility] : [facility]

## 2024-01-06 NOTE — Progress Notes (Signed)
 Cardiology Clinic Note   Date: 01/08/2024 ID: Yesenia Wood, DOB 1986/06/13, MRN [id number]  Primary Cardiologist:  Antionette Kirks, MD  Chief Complaint   Yesenia Wood is a 38 y.o. female who presents to the clinic today for evaluation of increased palpitations and dizziness.   Patient Profile   Yesenia Wood is followed by Dr. Alvenia Aus for the history outlined below.      Past medical history significant for: Palpitations/inappropriate sinus tachycardia. 14-day ZIO 10/27/2022: HR 49-137 bpm, average 75 bpm.  Predominantly NSR.  Rare PACs/PVCs. Dyspnea. Coronary CTA 10/02/2019: Coronary calcium  score 0.  No evidence of CAD. Echo 11/17/2022: EF 60 to 65%.  No RWMA.  Normal diastolic parameters.  Normal RV size/function.  Mild MR.  Aortic valve sclerosis without stenosis. Hyperlipidemia. Lipid panel 02/10/2023: LDL 62, HDL 41, TG 142, total 126. Migraine. Asthma. OSA. GERD. MDD. Deaf.  In summary, patient's cardiac history dates back to 2015 at which time she was experiencing chest pain and palpitations.  Echo was normal and exercise stress test in July 2016 showed no evidence of ischemia though she did have an exaggerated heart rate blood pressure response and was placed on diltiazem .  She later transitioned to metoprolol .  In late 2020 she was evaluated for chest pain and underwent coronary CTA as detailed above.  Echo July 2021 showed normal LV function without significant valvular abnormalities.  In the setting of ongoing palpitations in September 2021 metoprolol  was discontinued in favor of acebutolol  which improved symptoms and stabilized heart rate to 80s to 90s.  In August 2023 during office visit patient complained of increased frequency of palpitations occurring daily and described as tachypalpitations lasting a few minutes and resolving spontaneously.  She also reported disrupted sleep with symptoms occurring in the middle of the night.  She reported associated dyspnea  throughout the day as well as increased fatigue and daytime somnolence.  She had been taking acebutolol  3 times a week and forgetting to take p.m. dose.  She was unsure if increased nighttime symptoms correlated with missed doses.  She had previously undergone sleep study in 2017 and was noted to have mild OSA.  14-day ZIO showed predominantly normal sinus rhythm as detailed above.  Echo showed normal LV/RV function as detailed above.  Patient was switched from acebutolol  to Toprol  with improvement of palpitations.  Patient reported feeling sluggish with inability to increase heart rate during exercise on Toprol  100 mg daily.  Dose was decreased to 50 mg.  Patient was last seen in the office by Dr. Alvenia Aus on 08/10/2023 for routine follow-up.  Patient reported improved exercise tolerance on decreased dose of Toprol .  She reported 1 incident of palpitations and increased heart rate after local anesthetic with the dentist.  It was felt symptoms of inappropriate sinus tachycardia well-controlled on the decreased dose of Toprol  with concern that increased dose would cause fatigue and exercise intolerance.  Patient was instructed to inform dentist not to use epinephrine with local anesthetic.     History of Present Illness    Today, patient is here alone. Visit is assisted by virtual ASL interpreter [name]. Halfway through visit interpreter changed to [facility]. Patient reports increased episodes of palpitations and dizziness. Episodes of dizziness occur at different times sometimes with walking, sometimes with standing and other times with sitting doing quiet activities. She reports associated heart pounding and headaches with episodes. She also reports feeling shaky, nauseated and presyncopal. BP has been consistently running in the 130-140s/90s with  some lower readings. She does not typically have episodes of dizziness with sit to stand. She does have stable dyspnea she attributes to asthma. She drinks about  72 oz of water daily and then "stops counting" but does have other liquids throughout the day. She does not consume a lot of salt, as she does not like it. She has tried compression in the past but did not feel it made a difference.     ROS: All other systems reviewed and are otherwise negative except as noted in History of Present Illness.  EKGs/Labs Reviewed    EKG Interpretation Date/Time:  Monday January 08 2024 14:07:21 EST Ventricular Rate:  83 PR Interval:  144 QRS Duration:  76 QT Interval:  328 QTC Calculation: 385 R Axis:   31  Text Interpretation: Normal sinus rhythm Normal ECG When compared with ECG of 10-Aug-2023 10:21, No significant change was found Confirmed by Morey Ar [phone]) on 01/08/2024 2:11:37 PM   07/04/2023: BUN 7; Creat 0.80; Potassium 4.2; Sodium 141 09/26/2023: ALT 19; AST 21   07/04/2023: Hemoglobin 13.6; WBC 7.2    Physical Exam    VS:  BP (!) 133/91   Pulse 83   Ht 5\' 3"  (1.6 m)   Wt 193 lb 6.4 oz (87.7 kg)   SpO2 98%   BMI 34.26 kg/m  , BMI Body mass index is 34.26 kg/m.  Orthostatic VS for the past 24 hrs (Last 3 readings):  BP- Lying Pulse- Lying BP- Sitting Pulse- Sitting BP- Standing at 0 minutes Pulse- Standing at 0 minutes BP- Standing at 3 minutes Pulse- Standing at 3 minutes  01/08/24 1412 129/90 86 130/87 84 123/82 88 117/82 93     GEN: Well nourished, well developed, in no acute distress. Neck: No JVD or carotid bruits. Cardiac:  RRR. No murmurs. No rubs or gallops.   Respiratory:  Respirations regular and unlabored. Clear to auscultation without rales, wheezing or rhonchi. GI: Soft, nontender, nondistended. Extremities: Radials/DP/PT 2+ and equal bilaterally. No clubbing or cyanosis. No edema.  Skin: Warm and dry, no rash. Neuro: Strength intact.  Assessment & Plan   Palpitations/inappropriate sinus tachycardia/dizziness/fatigue 14-day ZIO November 2023 showed HR 49 to 137 bpm, average 75 bpm, predominantly NSR, rare  PACs/PVCs.  Patient reports increased episodes of dizziness and palpitations with associated headaches, shakiness, nausea and presyncope. Symptoms start randomly sometimes with walking, standing or sitting. She does not typically get dizzy with sit to stand. BP has been elevated in the 130-140s/90s. She hydrates well tracking 72 oz of water before she "stops counting" but does have other liquids throughout the day. She has tried compression in the past but felt it did not help. She is not symptomatic with orthostatic vitals. BP and HR were stable.  -CBC, BMP, TSH, Mg today. -Stop Toprol  and start carvedilol  3.125 mg bid.   Dyspnea Coronary CTA November 2020 showed coronary calcium  score of 0 with no evidence of CAD.  Echo December 2023 showed EF 60 to 65%, no RWMA, normal diastolic parameters, normal RV size/function, mild MR.  Patient reports stable dyspnea she attributes to asthma.  -No further testing indicated at this time.   Hyperlipidemia LDL March 2024 62, at goal. -Continue rosuvastatin . -Continue to follow with PCP.  Disposition: CBC, BMP, TSH, Mg today. Stop Toprol  and start carvedilol  3.125 mg bid. Return for previously scheduled visit with Dr. Alvenia Aus on 3/26 or sooner as needed.          Signed, Yesenia Rives. Keymoni Mccaster, DNP,  NP-C

## 2024-01-08 ENCOUNTER — Ambulatory Visit: Payer: Medicare Other | Attending: Student | Admitting: Student

## 2024-01-08 ENCOUNTER — Encounter: Payer: Self-pay | Admitting: Student

## 2024-01-08 VITALS — BP 133/91 | HR 83 | Ht 63.0 in | Wt 193.4 lb

## 2024-01-08 DIAGNOSIS — R42 Dizziness and giddiness: Secondary | ICD-10-CM

## 2024-01-08 DIAGNOSIS — R0602 Shortness of breath: Secondary | ICD-10-CM | POA: Diagnosis not present

## 2024-01-08 DIAGNOSIS — I4711 Inappropriate sinus tachycardia, so stated: Secondary | ICD-10-CM

## 2024-01-08 DIAGNOSIS — E7800 Pure hypercholesterolemia, unspecified: Secondary | ICD-10-CM

## 2024-01-08 DIAGNOSIS — R002 Palpitations: Secondary | ICD-10-CM | POA: Diagnosis not present

## 2024-01-08 DIAGNOSIS — R5383 Other fatigue: Secondary | ICD-10-CM | POA: Diagnosis not present

## 2024-01-08 DIAGNOSIS — R000 Tachycardia, unspecified: Secondary | ICD-10-CM | POA: Diagnosis not present

## 2024-01-08 MED ORDER — CARVEDILOL 3.125 MG PO TABS
3.1250 mg | ORAL_TABLET | Freq: Two times a day (BID) | ORAL | 3 refills | Status: DC
Start: 2024-01-08 — End: 2024-01-25

## 2024-01-08 NOTE — Patient Instructions (Signed)
 Medication Instructions:  Your physician has recommended you make the following change in your medication:   STOP Toprol  START Carvedilol  3.125 twice a day  *If you need a refill on your cardiac medications before your next appointment, please call your pharmacy*   Lab Work: CBC, BMET, TSH, MAG today here in the office.   If you have labs (blood work) drawn today and your tests are completely normal, you will receive your results only by: MyChart Message (if you have MyChart) OR A paper copy in the mail If you have any lab test that is abnormal or we need to change your treatment, we will call you to review the results.   Testing/Procedures: None   Follow-Up: At [facility], you and your health needs are our priority.  As part of our continuing mission to provide you with exceptional heart care, we have created designated Provider Care Teams.  These Care Teams include your primary Cardiologist (physician) and Advanced Practice Providers (APPs -  Physician Assistants and Nurse Practitioners) who all work together to provide you with the care you need, when you need it.   Your next appointment:    Keep scheduled appointment  02/21/24 at 10:20 am with Dr. Alvenia Aus  Provider:   Antionette Kirks, MD

## 2024-01-09 LAB — CBC
Hematocrit: 48.8 % — ABNORMAL HIGH (ref 34.0–46.6)
Hemoglobin: 15.5 g/dL (ref 11.1–15.9)
MCH: 28.7 pg (ref 26.6–33.0)
MCHC: 31.8 g/dL (ref 31.5–35.7)
MCV: 90 fL (ref 79–97)
Platelets: 414 10*3/uL (ref 150–450)
RBC: 5.4 x10E6/uL — ABNORMAL HIGH (ref 3.77–5.28)
RDW: 13.2 % (ref 11.7–15.4)
WBC: 10.4 10*3/uL (ref 3.4–10.8)

## 2024-01-09 LAB — TSH: TSH: 2.58 u[IU]/mL (ref 0.450–4.500)

## 2024-01-09 LAB — BASIC METABOLIC PANEL
BUN/Creatinine Ratio: 7 — ABNORMAL LOW (ref 9–23)
BUN: 8 mg/dL (ref 6–20)
CO2: 22 mmol/L (ref 20–29)
Calcium: 9.6 mg/dL (ref 8.7–10.2)
Chloride: 103 mmol/L (ref 96–106)
Creatinine, Ser: 1.08 mg/dL — ABNORMAL HIGH (ref 0.57–1.00)
Glucose: 85 mg/dL (ref 70–99)
Potassium: 4.8 mmol/L (ref 3.5–5.2)
Sodium: 141 mmol/L (ref 134–144)
eGFR: 67 mL/min/{1.73_m2} (ref >59)

## 2024-01-09 LAB — MAGNESIUM: Magnesium: 2.3 mg/dL (ref 1.6–2.3)

## 2024-01-10 ENCOUNTER — Encounter
Payer: Medicare Other | Attending: Physical Medicine and Rehabilitation | Admitting: Physical Medicine and Rehabilitation

## 2024-01-10 ENCOUNTER — Encounter: Payer: Self-pay | Admitting: Physical Medicine and Rehabilitation

## 2024-01-10 VITALS — BP 119/85 | HR 93 | Wt 196.0 lb

## 2024-01-10 DIAGNOSIS — M7918 Myalgia, other site: Secondary | ICD-10-CM

## 2024-01-10 DIAGNOSIS — M797 Fibromyalgia: Secondary | ICD-10-CM | POA: Diagnosis not present

## 2024-01-10 MED ORDER — LIDOCAINE HCL 1 % IJ SOLN
6.0000 mL | Freq: Once | INTRAMUSCULAR | Status: AC
  Administered 2024-01-10: 6 mL

## 2024-01-10 MED ORDER — TRIAMCINOLONE ACETONIDE 40 MG/ML IJ SUSP
5.0000 mg | Freq: Once | INTRAMUSCULAR | Status: AC
  Administered 2024-01-10: 5.2 mg via INTRAMUSCULAR

## 2024-01-10 NOTE — Progress Notes (Signed)
HPI: Yesenia Wood is a 38 y.o. female with PMHx has Deaf; B12 deficiency; Cochlear implant status; Fatty infiltration of liver; Gastro-esophageal reflux disease without esophagitis; Generalized headache; Right-sided Bell's palsy; Hypertriglyceridemia; Irregular menstrual cycle; Overweight; Paresthesia; Disorder of labyrinth; Palpitations; Sleep disturbance; Chronic migraine without aura; Dissocial personality disorder (HCC); OSA (obstructive sleep apnea); Insomnia due to mental condition; Muteness; MDD (major depressive disorder), recurrent episode, moderate (HCC); Social anxiety disorder; Allergic rhinitis; Bartholin gland cyst; Migraine; Obesity (BMI 30-39.9); RLS (restless legs syndrome); Right sided numbness; Sleep difficulties; Positive ANA (antinuclear antibody); Fibromyalgia; Dyslipidemia; Vitamin D deficiency; Nausea; Chronic pain syndrome; Sprain of left ankle; and Acute right ankle pain on their problem list. who presents to clinic for treatment of pain related to myofascial pain  via injection as described below.   She says R sided weakness is intermittent, no associations with activity or time of day that she can note. Has EMG next month.  Tramadol causing her to be restless at nighttime, is still intermittently using naproxen. Did ask about    Physical Exam:  General: Appropriate appearance for age.  Mental Status: Appropriate mood and affect.  Cardiovascular: RRR, no m/r/g.  Respiratory: CTAB, no rales/rhonchi/wheezing.  Skin: No apparent rashes or lesions.  Neuro: Awake, alert, and oriented x3. No apparent deficits.  + Sensation to light touch decreased diffusely on the right arm and leg-unchanged Coordination: No deficits in fine motor or ataxia noted.    MSK: Moving all 4 limbs antigravity and against resistance; 4/5 throughout + TTP with palpable trigger points in bilateral cervical/thoracic/lumbarparaspinals, trapezius muscles, rhomboids, and quadratus/psoas muscles      PROCEDURE:  Bilateral  trigger point injections Diagnosis:      ICD-10-CM    1. Myofascial pain  M79.18 lidocaine (XYLOCAINE) 1 % (with pres) injection 6 mL      triamcinolone acetonide (KENALOG-40) injection 5.2 mg     2. Fibromyalgia  M79.7 lidocaine (XYLOCAINE) 1 % (with pres) injection 6 mL      triamcinolone acetonide (KENALOG-40) injection 5.2 mg       Goals with treatment: [ X ] Decrease pain [  ] Improve Active / Passive ROM [ x ] Improve ADLs [ ]  Improve functional mobility   MEDICATION:  [ x ] Kenalog 40 mg/mL  [ X ] Lidocaine 1%      CONSENT: Obtained in writing per policy. Consent uploaded to chart.   Benefits discussed.  Risks discussed included, but were not limited to, pain and discomfort, bleeding, bruising, allergic reaction, infection. All questions answered to patient/family member/guardian/ caregiver satisfaction. They would like to proceed with procedure. There are no noted contraindications to procedure.   PROCEDURE Time out was preformed No heat sources No antibiotics   The patient was explained about both the benefits and risks of a Bilateral  trigger point injections. After the patient acknowledged an understanding of the risks and benefits, the patient agreed to proceed. The area was first marked and then prepped in an aseptic fashion with betadine / alcohol. A 30 g, 1/2 inch needle was directed via a direct approach into the  bilateral cervical/thoracic/lumbarparaspinals, trapezius muscles, rhomboids, and quadratus/psoas muscles. The injection was completed with Kenalog 40 mg/ml 0.2 cc mixed with 6 cc of 1% lidocaine after no blood was aspirated on pull back.   No complications were encountered. The patient tolerated the procedure well.   Impression: HPI: Yesenia Wood is a 38 y.o. female with PMHx has Deaf; B12 deficiency; Cochlear implant status; Fatty  infiltration of liver; Gastro-esophageal reflux disease without esophagitis; Generalized headache;  Right-sided Bell's palsy; Hypertriglyceridemia; Irregular menstrual cycle; Overweight; Paresthesia; Disorder of labyrinth; Palpitations; Sleep disturbance; Chronic migraine without aura; Dissocial personality disorder (HCC); OSA (obstructive sleep apnea); Insomnia due to mental condition; Muteness; MDD (major depressive disorder), recurrent episode, moderate (HCC); Social anxiety disorder; Allergic rhinitis; Bartholin gland cyst; Migraine; Obesity (BMI 30-39.9); RLS (restless legs syndrome); Right sided numbness; Sleep difficulties; Positive ANA (antinuclear antibody); Fibromyalgia; Dyslipidemia; Vitamin D deficiency; Nausea; Chronic pain syndrome; Sprain of left ankle; and Acute right ankle pain on their problem list. who presents to clinic for treatment of myofascial pain . They received a  Bilateral  trigger point injections as above.   PLAN:  Myofascial pain - Resume Usual Activities. Notify Physician of any unusual bleeding, erythema or concern for side effects as reviewed above. - Apply ice prn for pain - Tylenol prn for pain - Follow up Q53m PRN for injections  - Continue Naprosyn as needed; stop tramadol due to side effects  - Did discuss butrans patch risks vs. Benefits, decided potenial s/e not worth medication, not offering other opiates  - Has EMG RUE and RLE in March - Follow up with your PCP regarding adjustments in folate that may be needed as a result of your genetic tests

## 2024-01-10 NOTE — Patient Instructions (Signed)
Follow up PRN for Trigger points Has EMG scheduled in March for RUE and RLE weakness

## 2024-01-11 ENCOUNTER — Encounter: Payer: Self-pay | Admitting: Family Medicine

## 2024-01-11 ENCOUNTER — Encounter: Payer: Self-pay | Admitting: Physician Assistant

## 2024-01-12 ENCOUNTER — Telehealth: Payer: Self-pay

## 2024-01-12 MED ORDER — LINACLOTIDE 145 MCG PO CAPS: 145.0000 ug | ORAL_CAPSULE | Freq: Every day | ORAL | 5 refills | Status: DC

## 2024-01-12 NOTE — Telephone Encounter (Signed)
Linzess 145 mcg sent to pharmacy per Inetta Fermo .

## 2024-01-24 ENCOUNTER — Encounter: Payer: Self-pay | Admitting: Cardiovascular Disease

## 2024-01-25 MED ORDER — CARVEDILOL 6.25 MG PO TABS: 6.2500 mg | ORAL_TABLET | Freq: Two times a day (BID) | ORAL | 1 refills | Status: DC

## 2024-01-31 ENCOUNTER — Encounter: Payer: Self-pay | Admitting: Obstetrics and Gynecology

## 2024-01-31 ENCOUNTER — Ambulatory Visit: Payer: Medicare Other | Admitting: Obstetrics and Gynecology

## 2024-01-31 VITALS — BP 113/75 | Ht 63.0 in | Wt 197.3 lb

## 2024-01-31 DIAGNOSIS — N808 Other endometriosis: Secondary | ICD-10-CM | POA: Diagnosis not present

## 2024-01-31 DIAGNOSIS — R635 Abnormal weight gain: Secondary | ICD-10-CM

## 2024-01-31 DIAGNOSIS — N938 Other specified abnormal uterine and vaginal bleeding: Secondary | ICD-10-CM | POA: Diagnosis not present

## 2024-01-31 DIAGNOSIS — Z3046 Encounter for surveillance of implantable subdermal contraceptive: Secondary | ICD-10-CM | POA: Diagnosis not present

## 2024-01-31 DIAGNOSIS — Z9889 Other specified postprocedural states: Secondary | ICD-10-CM

## 2024-01-31 DIAGNOSIS — Z79899 Other long term (current) drug therapy: Secondary | ICD-10-CM | POA: Diagnosis not present

## 2024-01-31 MED ORDER — ETONOGESTREL 68 MG ~~LOC~~ IMPL
68.0000 mg | DRUG_IMPLANT | Freq: Once | SUBCUTANEOUS | Status: AC
  Administered 2024-01-31: 68 mg via SUBCUTANEOUS

## 2024-01-31 NOTE — Patient Instructions (Signed)
NEXPLANON PLACEMENT POST-PROCEDURE INSTRUCTIONS  You may take Ibuprofen, Aleve or Tylenol for pain if needed.  Pain should resolve within in 24 hours.  You may have intercourse after 24 hours.  If you using this for birth control, it is effective immediately.  You need to call if you have any fever, heavy bleeding, or redness at insertion site. Irregular bleeding is common the first several months after having a Nexplanonplaced. You do not need to call for this reason unless you are concerned.  Shower or bathe as normal.  You can remove the bandage after 24 hours.  

## 2024-01-31 NOTE — Progress Notes (Addendum)
    GYNECOLOGY PROGRESS NOTE  Subjective:    Patient ID: Yesenia Wood, female    DOB: 1986/03/05, 38 y.o.   MRN: [id number]  ASL video interpreter used for interpretation.   HPI  Patient is a 38 y.o. G0P0000 female who presents for follow up on medication. She is taking Orilissa 150 mg, which she has been on for several months.  She notes that the medication helps with her pelvic pain, but continues to have vaginal bleeding that last for about 2 weeks to 1 month on the lower dose.  Was initiated on Aygestin ~ 4-5 months ago due to vasomotor symptoms.   Is continuing to take the Aygestin 5 mg.  Has concerns that these medications are causing significant weight gain. Review of chart notes almost 20 lb weight gain since last year.   The following portions of the patient's history were reviewed and updated as appropriate: allergies, current medications, past family history, past medical history, past social history, past surgical history, and problem list.  Review of Systems Pertinent items noted in HPI and remainder of comprehensive ROS otherwise negative.   Objective:   Blood pressure 113/75, height 5\' 3"  (1.6 m), weight 197 lb 4.8 oz (89.5 kg).  Body mass index is 34.95 kg/m. General appearance: alert and no distress Remainder of exam deferred.    Assessment:   1. Medication management   2. DUB (dysfunctional uterine bleeding)   3. History of endometrial ablation   4. Other endometriosis   5. Weight gain      Plan:  - Lengthy discussion had with patient regarding other management options if she desired to discontinue current meds, including another trial of IUD (removed initially due to pelvic pain after 2 years), Danazol, Depot Lupron (however patient noted side effects with use in the past), Nexplanon, or definitive management with hysterectomy. After discussion of options, patient opts for use of the Nexplanon. Inserted today (see procedure note below).  - Will f/u in 1 month to  reassess symptoms off Orilissa and Aygestin. Counseled on risk of abnormal bleeding within first few months of Nexplanon placement.    A total of 45 minutes were spent during this encounter, including review of previous progress notes, face-to-face with time with patient involving counseling and coordination of care, as well as documentation for current visit.    Nexplanon Insertion Procedure Patient identified, informed consent performed, consent signed.   Patient does understand that irregular bleeding is a very common side effect of this medication. Discussed other risks and benefits of this contraception modality. She was advised to have backup contraception for one week after placement. Pregnancy test in clinic today was negative.  Appropriate time out taken.  Patient's left arm was prepped and draped in the usual sterile fashion. The ruler used to measure and mark insertion area.  Patient was prepped with alcohol swab and then injected with 2 ml of 1% lidocaine.  She was prepped with betadine, Nexplanon removed from packaging,  Device confirmed in needle, then inserted full length of needle and withdrawn per handbook instructions. Nexplanon was able to palpated in the patient's arm; patient palpated the insert herself. There was minimal blood loss.  Patient insertion site covered with guaze and a pressure bandage to reduce any bruising.  The patient tolerated the procedure well and was given post procedure instructions.   Lot: [id number] Exp: 12/2025  Hildred Laser, MD [facility]

## 2024-02-07 ENCOUNTER — Encounter: Payer: Self-pay | Admitting: Physical Medicine and Rehabilitation

## 2024-02-07 ENCOUNTER — Encounter
Payer: Medicare Other | Attending: Physical Medicine and Rehabilitation | Admitting: Physical Medicine and Rehabilitation

## 2024-02-07 VITALS — BP 112/79 | HR 77 | Ht 63.0 in | Wt 196.0 lb

## 2024-02-07 DIAGNOSIS — R200 Anesthesia of skin: Secondary | ICD-10-CM | POA: Diagnosis not present

## 2024-02-07 DIAGNOSIS — R531 Weakness: Secondary | ICD-10-CM | POA: Diagnosis not present

## 2024-02-07 DIAGNOSIS — M7918 Myalgia, other site: Secondary | ICD-10-CM | POA: Insufficient documentation

## 2024-02-07 DIAGNOSIS — G894 Chronic pain syndrome: Secondary | ICD-10-CM | POA: Diagnosis not present

## 2024-02-07 DIAGNOSIS — M797 Fibromyalgia: Secondary | ICD-10-CM | POA: Diagnosis not present

## 2024-02-07 MED ORDER — TRIAMCINOLONE ACETONIDE 40 MG/ML IJ SUSP
5.0000 mg | Freq: Once | INTRAMUSCULAR | Status: AC
  Administered 2024-02-07: 5.2 mg

## 2024-02-07 MED ORDER — LIDOCAINE HCL 1 % IJ SOLN
6.0000 mL | Freq: Once | INTRAMUSCULAR | Status: AC
  Administered 2024-02-07: 6 mL

## 2024-02-07 NOTE — Patient Instructions (Signed)
-   Resume Usual Activities. Notify Physician of any unusual bleeding, erythema or concern for side effects as reviewed above. - Apply ice prn for pain - Tylenol prn for pain - Follow up Q57m PRN for injections   - Did discuss butrans patch risks vs. Benefits - Has EMG RUE and RLE in March

## 2024-02-07 NOTE — Progress Notes (Signed)
 HPI: Yesenia Wood is a 38 y.o. female with PMHx has Deaf; B12 deficiency; Cochlear implant status; Fatty infiltration of liver; Gastro-esophageal reflux disease without esophagitis; Generalized headache; Right-sided Bell's palsy; Hypertriglyceridemia; Irregular menstrual cycle; Overweight; Paresthesia; Disorder of labyrinth; Palpitations; Sleep disturbance; Chronic migraine without aura; Dissocial personality disorder (HCC); OSA (obstructive sleep apnea); Insomnia due to mental condition; Muteness; MDD (major depressive disorder), recurrent episode, moderate (HCC); Social anxiety disorder; Allergic rhinitis; Bartholin gland cyst; Migraine; Obesity (BMI 30-39.9); RLS (restless legs syndrome); Right sided numbness; Sleep difficulties; Positive ANA (antinuclear antibody); Fibromyalgia; Dyslipidemia; Vitamin D deficiency; Nausea; Chronic pain syndrome; Sprain of left ankle; and Acute right ankle pain on their problem list. who presents to clinic for treatment of pain related to myofascial pain  via injection as described below.    Since last visit, she switched birth control to Nexplanon. No concerning side effects. Has follow up next month. 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 She is finished with tramadol due to restlessness.    Physical Exam:  General: Appropriate appearance for age.  Mental Status: Appropriate mood and affect.  Cardiovascular: RRR, no m/r/g.  Respiratory: CTAB, no rales/rhonchi/wheezing.  Skin: No apparent rashes or lesions.  Neuro: Awake, alert, and oriented x3. No apparent deficits.  + Sensation to light touch decreased diffusely on the right arm and leg-unchanged Coordination: No deficits in fine motor or ataxia noted.    MSK: Moving all 4 limbs antigravity and against resistance; 4/5 throughout + TTP with palpable trigger points in bilateral cervical paraspinals around C2/3 and C7, and lumbar paraspinals around L5/S1, trapezius  muscles, and levator scapulae    PROCEDURE:  Bilateral  trigger point injections Diagnosis:      ICD-10-CM    1. Myofascial pain  M79.18 lidocaine (XYLOCAINE) 1 % (with pres) injection 6 mL      triamcinolone acetonide (KENALOG-40) injection 5.2 mg     2. Fibromyalgia  M79.7 lidocaine (XYLOCAINE) 1 % (with pres) injection 6 mL      triamcinolone acetonide (KENALOG-40) injection 5.2 mg       Goals with treatment: [ X ] Decrease pain [  ] Improve Active / Passive ROM [ x ] Improve ADLs [ ]  Improve functional mobility   MEDICATION:  [ x ] Kenalog 40 mg/mL  [ X ] Lidocaine 1%      CONSENT: Obtained in writing per policy. Consent uploaded to chart.   Benefits discussed.  Risks discussed included, but were not limited to, pain and discomfort, bleeding, bruising, allergic reaction, infection. All questions answered to patient/family member/guardian/ caregiver satisfaction. They would like to proceed with procedure. There are no noted contraindications to procedure.   PROCEDURE Time out was preformed No heat sources No antibiotics   The patient was explained about both the benefits and risks of a Bilateral  trigger point injections. After the patient acknowledged an understanding of the risks and benefits, the patient agreed to proceed. The area was first marked and then prepped in an aseptic fashion with betadine / alcohol. A 30 g, 1/2 inch needle was directed via a direct approach into the  bilateral cervical paraspinals around C2/3 and C7, and lumbar paraspinals around L5/S1, trapezius muscles, and levator scapulae muscles. The injection was completed with Kenalog 40 mg/ml 0.2 cc mixed with 6 cc of 1% lidocaine after no blood was aspirated on pull back.   No complications were encountered. The patient tolerated the procedure well.  Impression: HPI: Yesenia Wood is a 38 y.o. female with PMHx has Deaf; B12 deficiency; Cochlear implant status; Fatty infiltration of liver;  Gastro-esophageal reflux disease without esophagitis; Generalized headache; Right-sided Bell's palsy; Hypertriglyceridemia; Irregular menstrual cycle; Overweight; Paresthesia; Disorder of labyrinth; Palpitations; Sleep disturbance; Chronic migraine without aura; Dissocial personality disorder (HCC); OSA (obstructive sleep apnea); Insomnia due to mental condition; Muteness; MDD (major depressive disorder), recurrent episode, moderate (HCC); Social anxiety disorder; Allergic rhinitis; Bartholin gland cyst; Migraine; Obesity (BMI 30-39.9); RLS (restless legs syndrome); Right sided numbness; Sleep difficulties; Positive ANA (antinuclear antibody); Fibromyalgia; Dyslipidemia; Vitamin D deficiency; Nausea; Chronic pain syndrome; Sprain of left ankle; and Acute right ankle pain on their problem list. who presents to clinic for treatment of myofascial pain . They received a  Bilateral  trigger point injections as above.   PLAN:   Myofascial pain - Resume Usual Activities. Notify Physician of any unusual bleeding, erythema or concern for side effects as reviewed above. - Apply ice prn for pain - Tylenol prn for pain - Follow up Q10m PRN for injections  - Continue Naprosyn rarely as needed while avoiding GI side effects; has failed tramadol  - Did discuss butrans patch risks vs. Benefits, decided to wait, may consider if pain gets out of control with her being more active in the spring - Has EMG RUE and RLE in March

## 2024-02-12 ENCOUNTER — Encounter: Payer: Self-pay | Admitting: Family Medicine

## 2024-02-12 ENCOUNTER — Ambulatory Visit (INDEPENDENT_AMBULATORY_CARE_PROVIDER_SITE_OTHER): Payer: Medicare Other | Admitting: Family Medicine

## 2024-02-12 VITALS — BP 110/70 | HR 81 | Temp 98.1°F | Resp 16 | Ht 63.0 in | Wt 194.1 lb

## 2024-02-12 DIAGNOSIS — E538 Deficiency of other specified B group vitamins: Secondary | ICD-10-CM

## 2024-02-12 DIAGNOSIS — M797 Fibromyalgia: Secondary | ICD-10-CM

## 2024-02-12 DIAGNOSIS — K5904 Chronic idiopathic constipation: Secondary | ICD-10-CM | POA: Diagnosis not present

## 2024-02-12 DIAGNOSIS — K219 Gastro-esophageal reflux disease without esophagitis: Secondary | ICD-10-CM

## 2024-02-12 DIAGNOSIS — J302 Other seasonal allergic rhinitis: Secondary | ICD-10-CM | POA: Diagnosis not present

## 2024-02-12 DIAGNOSIS — L989 Disorder of the skin and subcutaneous tissue, unspecified: Secondary | ICD-10-CM | POA: Diagnosis not present

## 2024-02-12 DIAGNOSIS — G894 Chronic pain syndrome: Secondary | ICD-10-CM

## 2024-02-12 DIAGNOSIS — Z862 Personal history of diseases of the blood and blood-forming organs and certain disorders involving the immune mechanism: Secondary | ICD-10-CM

## 2024-02-12 DIAGNOSIS — G43709 Chronic migraine without aura, not intractable, without status migrainosus: Secondary | ICD-10-CM | POA: Diagnosis not present

## 2024-02-12 DIAGNOSIS — E559 Vitamin D deficiency, unspecified: Secondary | ICD-10-CM | POA: Diagnosis not present

## 2024-02-12 DIAGNOSIS — J3089 Other allergic rhinitis: Secondary | ICD-10-CM | POA: Diagnosis not present

## 2024-02-12 DIAGNOSIS — J4530 Mild persistent asthma, uncomplicated: Secondary | ICD-10-CM

## 2024-02-12 DIAGNOSIS — E785 Hyperlipidemia, unspecified: Secondary | ICD-10-CM

## 2024-02-12 MED ORDER — ROSUVASTATIN CALCIUM 20 MG PO TABS: 20.0000 mg | ORAL_TABLET | Freq: Every day | ORAL | 1 refills | Status: DC

## 2024-02-12 NOTE — Progress Notes (Signed)
 Name: Yesenia Wood   MRN: [id number]    DOB: February 24, 1986   Date:02/12/2024       Progress Note  Subjective  Chief Complaint  Chief Complaint  Patient presents with   Medical Management of Chronic Issues   HPI  Interpreter today is: Fleet Contras    Migraine headaches:  seeing Dr. Neale Burly in [place] , she is going every 4 weeks to get Botox, she still has migraine episodes a few times a weeks that lasts more than one day, she has headaches almost daily of different intensities. The intensity of the migraines decreased with the Botox injections   Palpitation: sees Dr. Kirke Corin, her last visit at his office was with his NP Phineas Douglas. She had Echo 11/2019 that showed normal LVEF, CTA 09/2019 calcium score of zero. She had a 48 hour Holter that showed ST and was given Diltiazem but unable to tolerate it, she was on Metoprolol for a while, however developed dizziness, bp was up and was seen by cardiologist and medication was changed to carvedilol and is feeling well now. No longer having dizziness.    FMS/myofacial pain: she is under the care of Dr. Shearon Stalls  She  tried PT and got worse, tried accupunture and dit not improve the pain, now she is getting trigger point injections about once a month  from neck down to her back depending on the severity of the pain and seems to help. She is also taking Naproxen before bedtime . Pain is 4-5/10    Vitamin D deficiency and B12/folic acid  deficiency : improved with supplementation , she also takes folic acid and B12 otc    Dyslipidemia: doing well on Rosuvastatin, however has been out of medication for the past 5 days and we will send a rx, she will return in one month for labs    MDD: she used to see psychiatrist Dr. Elna Breslow and also a clinical social worker, not taking any medications, she is looking for another therapist. She states mood is better    Mild persistent asthma : she states recently she has been wheezing again, she has seasonal allergies, she  has seen pulmonologist and tried multiple inhalers without help . She is allergic to singulair. She asked to see an allergist    IBS constipation type and GERD: under the care of GI and is following a low FODMAP diet and is taking Linzess 145 mg and it seems to help with constipation also taking PPI and H2 blocker and is doing better    Skin lesion: noticed about one month ago, dark spot on left calf, showed up fast and is growing. Advised to return for a shave biopsy    Patient Active Problem List   Diagnosis Date Noted   Myofascial pain 10/04/2023   Right sided weakness 10/04/2023   Sprain of left ankle 05/01/2023   Chronic pain syndrome 03/03/2023   Nausea 08/12/2022   Dyslipidemia 02/10/2022   Vitamin D deficiency 02/10/2022   Positive ANA (antinuclear antibody) 12/20/2021   Fibromyalgia 12/20/2021   Sleep difficulties 04/30/2020   Right sided numbness 12/26/2019   RLS (restless legs syndrome) 09/30/2019   MDD (major depressive disorder), recurrent episode, moderate (HCC) 07/17/2019   Social anxiety disorder 07/17/2019   Muteness 09/10/2018   OSA (obstructive sleep apnea) 10/10/2016   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   Dissocial personality disorder (HCC) 06/07/2016   Sleep disturbance 05/10/2016   Chronic migraine without aura 03/29/2016   Palpitations 06/09/2015  Deaf 05/16/2015   B12 deficiency 05/16/2015   Cochlear implant status 05/16/2015   Fatty infiltration of liver 05/16/2015   Gastro-esophageal reflux disease without esophagitis 05/16/2015   Generalized headache 05/16/2015   Right-sided Bell's palsy 05/16/2015   Hypertriglyceridemia 05/16/2015   Irregular menstrual cycle 05/16/2015   Overweight 05/16/2015   Paresthesia 05/16/2015   Disorder of labyrinth 05/16/2015   Migraine 08/05/2013   Allergic rhinitis 07/01/2013   Obesity (BMI 30-39.9) 07/01/2013   Bartholin gland cyst 09/16/2008    Past Surgical History:  Procedure Laterality Date    BARTHOLIN GLAND CYST REMOVAL     COCHLEAR IMPLANT     COCHLEAR IMPLANT REMOVAL     DILATATION AND CURETTAGE/HYSTEROSCOPY WITH MINERVA N/A 03/05/2021   Procedure: DILATATION AND CURETTAGE/HYSTEROSCOPY WITH MINERVA;  Surgeon: Hildred Laser, MD;  Location: [facility];  Service: Gynecology;  Laterality: N/A;   LAPAROSCOPIC TUBAL LIGATION Bilateral 03/26/2018   Procedure: LAPAROSCOPIC TUBAL LIGATION;  Surgeon: Hildred Laser, MD;  Location: [facility];  Service: Gynecology;  Laterality: Bilateral;  with peritoneal biopsies   LAPAROSCOPY     MYRINGOTOMY WITH TUBE PLACEMENT     TUBAL LIGATION  03/26/2018    Family History  Adopted: Yes  Family history unknown: Yes    Social History   Tobacco Use   Smoking status: Former    Current packs/day: 0.00    Average packs/day: 1 pack/day for 2.0 years (2.0 ttl pk-yrs)    Types: Cigarettes    Start date: 11/28/2004    Quit date: 11/28/2006    Years since quitting: 17.2   Smokeless tobacco: Never  Substance Use Topics   Alcohol use: Yes    Comment: 1 every 3 months     Current Outpatient Medications:    BOTOX 100 units SOLR injection, Inject 100 Units into the muscle every 3 (three) months., Disp: , Rfl:    carvedilol (COREG) 6.25 MG tablet, Take 1 tablet (6.25 mg total) by mouth 2 (two) times daily., Disp: 180 tablet, Rfl: 1   Cholecalciferol (VITAMIN D) 50 MCG (2000 UT) CAPS, Take 1 capsule by mouth daily., Disp: , Rfl:    Cyanocobalamin (VITAMIN B-12) 500 MCG SUBL, Place 1 tablet (500 mcg total) under the tongue once a week., Disp: 30 tablet, Rfl: 0   etonogestrel (NEXPLANON) 68 MG IMPL implant, 68 mg by Subdermal route once. Implantation 01/31/24, Disp: , Rfl:    fluticasone (FLONASE) 50 MCG/ACT nasal spray, Place 1 spray into both nostrils daily., Disp: , Rfl:    folic acid (FOLVITE) 400 MCG tablet, Take 800 mcg by mouth daily., Disp: , Rfl:    linaclotide (LINZESS) 145 MCG CAPS capsule, Take 1 capsule (145 mcg total) by mouth daily before breakfast.,  Disp: 30 capsule, Rfl: 5   loratadine (CLARITIN) 10 MG tablet, Take 10 mg by mouth daily., Disp: , Rfl:    MELATONIN PO, Take 6 mg by mouth at bedtime., Disp: , Rfl:    naproxen (NAPROSYN) 500 MG tablet, Take 1 tablet (500 mg total) by mouth daily as needed (take with benadryll)., Disp: 90 tablet, Rfl: 3   pantoprazole (PROTONIX) 40 MG tablet, Take 1 tablet (40 mg total) by mouth daily. No refills - needs f/up with PCP or GI, Disp: 90 tablet, Rfl: 3   rosuvastatin (CRESTOR) 20 MG tablet, Take 1 tablet (20 mg total) by mouth daily., Disp: 90 tablet, Rfl: 1  Current Facility-Administered Medications:    lidocaine (XYLOCAINE) 1 % (with pres) injection 6 mL, 6 mL, Other, Once,  Allergies  Allergen Reactions   Budesonide-Formoterol Fumarate Anaphylaxis and Other (See Comments)    Other reaction(s): Other (See Comments) chest pain Chest pain chest pain   Fluogen [Influenza Virus Vaccine] Hives    rash   Astelin [Azelastine]     Tingling, numbness, nausea   Gabapentin Nausea And Vomiting   Nortriptyline Other (See Comments)    Other reaction(s): Other (See Comments)   Lamictal [Lamotrigine]     Self injurious thoughts   Milnacipran Other (See Comments)    Pressure in eyes   Tegaderm Ag Mesh [Silver] Itching   Diclofenac Other (See Comments)    Other reaction(s): Other (See Comments)   Duloxetine Hcl Other (See Comments)    Other reaction(s): Other (See Comments) Other reaction(s): unknown   Montelukast Sodium Other (See Comments)    Other reaction(s): wheezing/sob   Mupirocin Other (See Comments)   Naproxen Other (See Comments) and Rash    Other reaction(s): rash/itching   Naproxen Sodium Rash    mild rash   Other Rash   Oxycodone Hcl Rash   Sulfa Antibiotics Hives, Rash and Itching    I personally reviewed active problem list, medication list, allergies, family history with the patient/caregiver today.   ROS  Ten systems reviewed and is negative except as mentioned in  HPI    Objective  Vitals:   02/12/24 0903  BP: 110/70  Pulse: 81  Resp: 16  Temp: 98.1 F (36.7 C)  TempSrc: Oral  SpO2: 97%  Weight: 194 lb 1.6 oz (88 kg)  Height: 5\' 3"  (1.6 m)    Body mass index is 34.38 kg/m.  Physical Exam  Constitutional: Patient appears well-developed and well-nourished. Obese  No distress.  HEENT: head atraumatic, normocephalic, pupils equal and reactive to light, neck supple Cardiovascular: Normal rate, regular rhythm and normal heart sounds.  No murmur heard. No BLE edema. Pulmonary/Chest: Effort normal and breath sounds normal. No respiratory distress. Abdominal: Soft.  There is no tenderness. Psychiatric: Patient has a normal mood and affect. behavior is normal. Judgment and thought content normal.   Recent Results (from the past 2160 hours)  [facility] Molecular Screen - Blood ([facility])     Status: None   Collection Time: 12/01/23  9:48 AM  Result Value Ref Range   Genetic Analysis Overall Interpretation Negative    Genetic Disease Assessed      [facility] Tier One Population Screen is a screening test that analyzes 11 genes related to hereditary breast and ovarian cancer (HBOC) syndrome, Lynch syndrome, and familial hypercholesterolemia. This test only reports clinically significant pathogenic and  likely pathogenic variants but does not report variants of uncertain significance (VUS). In addition, analysis of the PMS2 gene excludes exons 11-15, which overlap with a known pseudogene (PMS2CL).    Genetic Analysis Report      No pathogenic or likely pathogenic variants were detected in the genes analyzed by this test.Genetic test results should be interpreted in the context of an individual's personal medical and family history. Alteration to medical management is NOT  recommended based solely on this result. Clinical correlation is advised.Additional Considerations- This is a screening test; individuals may still carry pathogenic or  likely pathogenic variant(s) in the tested genes that are not detected by this test.-  For individuals at risk for these or other related conditions based on factors including personal or family history, diagnostic testing is recommended.- The absence of pathogenic or likely pathogenic variant(s) in the analyzed genes, while reassuring,  does not eliminate the possibility of a hereditary condition; there are other variants and genes associated with heart disease and hereditary cancer that are not included in this test.    Genes Tested See Notes     Comment: APOB, BRCA1, BRCA2, EPCAM, LDLR, LDLRAP1, PCSK9, PMS2, MLH1, MSH2, MSH6   Disclaimer See Notes     Comment: This test was developed and validated by [facility] This test has not been cleared or approved by the [place] (FDA). The [facility] laboratory is accredited by the College of American Pathologists (CAP) and certified under  the Clinical Laboratory Improvement Amendments (CLIA #: [id number]) to perform high-complexity clinical tests. This test is used for clinical purposes. It should not be regarded as investigational or for research.    Sequencing Location See Notes     Comment: Sequencing done at [facility] [address], [address], [place], [place] [address] (CLIA# [id number])   Interpretation Methods and Limitations See Notes     Comment: Extracted DNA is enriched for targeted regions and then sequenced using the [facility] Exome+ (R) assay on an Illumina DNA sequencing system. Data is then aligned to a modified version of GRCh38 and all genes are analyzed using the MANE transcript and MANE  Plus Clinical transcript, when available. Small variant calling is completed using a customized version of [employer]'s DNAseq software, augmented by a proprietary small variant caller for difficult variants. Copy number variants (CNVs) are then called  using a proprietary bioinformatics pipeline based on depth analysis  with a comparison to similarly sequenced samples. Analysis of the PMS2 gene is limited to exons 1-10. The interpretation and reporting of variants in APOB, PCSK9, and LDLR is specific to  familial hypercholesterolemia; variants associated with hypobetalipoproteinemia are not included. Interpretation is based upon guidelines published by the [employer]) and the Association for  Molecular  Pathology (AMP) or their modification by [employer] when available. Interpretation is limited to the transcripts indicated on the report and +/- 10 bp into intronic regions, except as noted below. [facility] variant classifications  include pathogenic, likely pathogenic, variant of uncertain significance (VUS), likely benign, and benign. Only variants classified as pathogenic and likely pathogenic are included in the report. All reported variants are confirmed through secondary  manual inspection of DNA sequence data or orthogonal testing. Risk estimations and management guidelines included in this report are based on analysis of primary literature and recommendations of applicable professional societies, and should be regarded  as approximations.Based on validation studies, this assay delivers > 99% sensitivity and specificity for single nucleotide variants and insertions and deletions (indels) up to 20 bp. Larger indels and complex variants are a lso reported but sensitivity  may be reduced. Based on validation studies, this assay delivers > 99% sensitivity to multi-exon CNVs and > 90% sensitivity to single-exon CNVs. This test may not detect variants in challenging regions (such as short tandem repeats, homopolymer runs, and  segment duplications), sub-exonic CNVs, chromosomal aneuploidy, or variants in the presence of mosaicism. Phasing will be attempted and reported, when possible. Structural rearrangements such as inversions, translocations, and gene  conversions are not  tested in this assay unless explicitly indicated. Additionally, deep intronic, promoter, and enhancer regions may not be covered. It is important to note that this is a screening test and cannot detect all disease-causing variants. A negative result does  not guarantee the absence of a rare, undetectable variant  in the genes analyzed; consider using a diagnostic test if there is significant personal and/or family history of one of the conditions analyze d by this test. Any potential incidental findings  outside of these genes and conditions will not be identified, nor reported. The results of a genetic test may be influenced by various factors, including bone marrow transplantation, blood transfusions, or in rare cases, hematolymphoid neoplasms.Gene  Specific Notes:APOB: analysis is limited to c.10580G>A and c.10579C>T; BRCA1: sequencing analysis extends to CDS +/-20 bp; BRCA2: sequencing analysis extends to CDS +/-20 bp. EPCAM: analysis is limited to CNV of exons 8-9; LDLR: analysis includes CNV of  the promoter; MLH1: analysis includes CNV of the promoter; PMS2: analysis is limited to exons 1-10.Gardenia Phlegm, PhD, [email or web address]   Magnesium     Status: None   Collection Time: 01/08/24  2:55 PM  Result Value Ref Range   Magnesium 2.3 1.6 - 2.3 mg/dL  TSH     Status: None   Collection Time: 01/08/24  2:55 PM  Result Value Ref Range   TSH 2.580 0.450 - 4.500 uIU/mL  Basic metabolic panel     Status: Abnormal   Collection Time: 01/08/24  2:55 PM  Result Value Ref Range   Glucose 85 70 - 99 mg/dL   BUN 8 6 - 20 mg/dL   Creatinine, Ser 4.54 (H) 0.57 - 1.00 mg/dL   eGFR 67 >09 WJ/XBJ/4.78   BUN/Creatinine Ratio 7 (L) 9 - 23   Sodium 141 134 - 144 mmol/L   Potassium 4.8 3.5 - 5.2 mmol/L   Chloride 103 96 - 106 mmol/L   CO2 22 20 - 29 mmol/L   Calcium 9.6 8.7 - 10.2 mg/dL  CBC     Status: Abnormal   Collection Time: 01/08/24  2:55 PM  Result Value Ref  Range   WBC 10.4 3.4 - 10.8 x10E3/uL   RBC 5.40 (H) 3.77 - 5.28 x10E6/uL   Hemoglobin 15.5 11.1 - 15.9 g/dL   Hematocrit 29.5 (H) 62.1 - 46.6 %   MCV 90 79 - 97 fL   MCH 28.7 26.6 - 33.0 pg   MCHC 31.8 31.5 - 35.7 g/dL   RDW 30.8 65.7 - 84.6 %   Platelets 414 150 - 450 x10E3/uL    Diabetic Foot Exam:     PHQ2/9:    02/12/2024    9:00 AM 02/07/2024   10:49 AM 01/10/2024    9:57 AM 12/15/2023   11:03 AM 11/08/2023   11:27 AM  Depression screen PHQ 2/9  Decreased Interest 0 0 0 0 0  Down, Depressed, Hopeless 0 0 0 0 0  PHQ - 2 Score 0 0 0 0 0  Altered sleeping 0   0   Tired, decreased energy 0   0   Change in appetite 0   0   Feeling bad or failure about yourself  0   0   Trouble concentrating 0   0   Moving slowly or fidgety/restless 0   0   Suicidal thoughts 0   0   PHQ-9 Score 0   0   Difficult doing work/chores Not difficult at all   Not difficult at all     phq 9 is negative  Fall Risk:    02/12/2024    9:00 AM 02/07/2024   10:48 AM 01/10/2024    9:57 AM 12/15/2023   11:02 AM 12/13/2023   10:09 AM  Fall Risk   Falls in the past year? 1  1 0 1 1  Number falls in past yr: 1 1 0 0 0  Comment  01/26/24     Injury with Fall? 1 1 0 1 1  Comment  no tx sought   bruised  Risk for fall due to : Impaired balance/gait   Impaired balance/gait   Follow up Education provided;Falls evaluation completed;Falls prevention discussed   Falls prevention discussed;Education provided;Falls evaluation completed      Assessment & Plan  1. Fibromyalgia (Primary)  Continue visits with PMR  2. Chronic pain syndrome  stable  3. Mild persistent asthma without complication  - Ambulatory referral to Allergy  4. Gastro-esophageal reflux disease without esophagitis  Stable on medication    4. Gastro-esophageal reflux disease without esophagitis  Stable  5. B12 deficiency  - B12 and Folate Panel  6. Chronic idiopathic constipation  Seeing GI and taking medication  7.  Chronic migraine without aura without status migrainosus, not intractable  Keep visits with neurologist   8. Vitamin D deficiency  - VITAMIN D 25 Hydroxy (Vit-D Deficiency, Fractures)  9. History of iron deficiency anemia  - Iron, TIBC and Ferritin Panel  10. Dyslipidemia  - Lipid panel - rosuvastatin (CRESTOR) 20 MG tablet; Take 1 tablet (20 mg total) by mouth daily.  Dispense: 90 tablet; Refill: 1  11. Perennial allergic rhinitis with seasonal variation  - Ambulatory referral to Allergy  12. Skin lesion of left leg  Return for shave biopsy

## 2024-02-12 NOTE — Addendum Note (Signed)
 Addended by: Alba Cory F on: 02/12/2024 12:43 PM   Modules accepted: Level of Service

## 2024-02-14 ENCOUNTER — Ambulatory Visit: Payer: Medicare Other | Admitting: Physical Medicine and Rehabilitation

## 2024-02-14 DIAGNOSIS — M791 Myalgia, unspecified site: Secondary | ICD-10-CM | POA: Diagnosis not present

## 2024-02-14 DIAGNOSIS — G43719 Chronic migraine without aura, intractable, without status migrainosus: Secondary | ICD-10-CM | POA: Diagnosis not present

## 2024-02-14 DIAGNOSIS — G518 Other disorders of facial nerve: Secondary | ICD-10-CM | POA: Diagnosis not present

## 2024-02-14 DIAGNOSIS — M542 Cervicalgia: Secondary | ICD-10-CM | POA: Diagnosis not present

## 2024-02-18 ENCOUNTER — Encounter: Payer: Self-pay | Admitting: Physician Assistant

## 2024-02-19 ENCOUNTER — Encounter: Payer: Medicare Other | Admitting: Physical Medicine and Rehabilitation

## 2024-02-19 ENCOUNTER — Encounter: Payer: Self-pay | Admitting: Physical Medicine and Rehabilitation

## 2024-02-19 ENCOUNTER — Other Ambulatory Visit: Payer: Self-pay

## 2024-02-19 VITALS — BP 118/83 | HR 75 | Ht 63.0 in | Wt 193.6 lb

## 2024-02-19 DIAGNOSIS — R200 Anesthesia of skin: Secondary | ICD-10-CM | POA: Diagnosis not present

## 2024-02-19 DIAGNOSIS — M7918 Myalgia, other site: Secondary | ICD-10-CM | POA: Diagnosis not present

## 2024-02-19 DIAGNOSIS — M797 Fibromyalgia: Secondary | ICD-10-CM | POA: Diagnosis not present

## 2024-02-19 DIAGNOSIS — R531 Weakness: Secondary | ICD-10-CM | POA: Diagnosis not present

## 2024-02-19 DIAGNOSIS — G894 Chronic pain syndrome: Secondary | ICD-10-CM | POA: Diagnosis not present

## 2024-02-19 MED ORDER — LINACLOTIDE 290 MCG PO CAPS: 290.0000 ug | ORAL_CAPSULE | Freq: Every day | ORAL | 5 refills | Status: DC

## 2024-02-19 NOTE — Progress Notes (Unsigned)
 Results will be uploaded into mychart in 2-3 days.

## 2024-02-20 DIAGNOSIS — R052 Subacute cough: Secondary | ICD-10-CM | POA: Diagnosis not present

## 2024-02-20 DIAGNOSIS — H1045 Other chronic allergic conjunctivitis: Secondary | ICD-10-CM | POA: Diagnosis not present

## 2024-02-20 DIAGNOSIS — R21 Rash and other nonspecific skin eruption: Secondary | ICD-10-CM | POA: Diagnosis not present

## 2024-02-20 DIAGNOSIS — J3089 Other allergic rhinitis: Secondary | ICD-10-CM | POA: Diagnosis not present

## 2024-02-21 ENCOUNTER — Encounter: Payer: Self-pay | Admitting: Cardiovascular Disease

## 2024-02-21 ENCOUNTER — Ambulatory Visit: Payer: Medicare Other | Attending: Cardiovascular Disease | Admitting: Cardiovascular Disease

## 2024-02-21 VITALS — BP 110/82 | HR 77 | Ht 63.0 in | Wt 191.0 lb

## 2024-02-21 DIAGNOSIS — I4711 Inappropriate sinus tachycardia, so stated: Secondary | ICD-10-CM | POA: Diagnosis not present

## 2024-02-21 DIAGNOSIS — E785 Hyperlipidemia, unspecified: Secondary | ICD-10-CM | POA: Insufficient documentation

## 2024-02-21 DIAGNOSIS — R0602 Shortness of breath: Secondary | ICD-10-CM | POA: Insufficient documentation

## 2024-02-21 NOTE — Progress Notes (Signed)
 Cardiology Office Note   Date:  02/21/2024   ID:  Yesenia Wood, DOB 05-10-1986, MRN [id number]  PCP:  Alba Cory, MD  Cardiologist:   Lorine Bears, MD   Chief Complaint  Patient presents with   Follow-up    Following up on new medication. Patient states that she is experiencing a little bit of chest pain      History of Present Illness: Yesenia Wood is a 38 y.o. female who presents for a follow-up visit regarding inappropriate sinus tachycardia.  This visit was facilitated with an interpreter given that the patient is deaf.  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  Prior echo in [place] 2015 was completely normal. She underwent treadmill stress testing 06/23/2015 that was normal. However, her HR and BP were elevated at peak exercise. 48-hour Holter monitor showed sinus tachycardia. She was initially treated with diltiazem but did not respond very well and ultimately switched to Toprol. She had previous cardiac CTA in November 2020 which showed a calcium score of 0 with no evidence of coronary artery disease.  She was seen in August 2023.  At that time she complained of increased palpitations mostly at night.  Previous sleep study in 2017 showed mild sleep apnea. She had an outpatient ZIO monitor in November of 2023 which showed normal sinus rhythm with rare premature beats.  Most triggered events did not correlate with arrhythmia.  Average heart rate was 75 bpm.  Echocardiogram was done in December which showed normal LV systolic function with mild mitral regurgitation and mild aortic sclerosis.  She was seen last month for increased dizziness and elevated blood pressure.  She was switched from metoprolol to carvedilol 3.125 mg twice daily.  She continued to be mildly tachycardic and hypertensive and thus the dose was increased to 6.25 mg twice  daily.  Since then, she felt significantly better and she is doing well at the present time.  She reports minimal palpitations and dizziness.   Past Medical History:  Diagnosis Date   ADHD (attention deficit hyperactivity disorder)    B12 deficiency    Bell's palsy    right sided   Chest pain    a. 05/2015 ETT: elev HR/BP, but no acute ST/T changes; b. 09/2019 Cor CTA: Nl cors, Ca2+ = 0.   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   Fatigue    Fatty liver    Fibromyalgia    GERD (gastroesophageal reflux disease)    Headache    High triglycerides    Hyperglycemia    Hypertension    Hyperthyroidism    Moderate asthma    Obesity    Paresthesia    Premature birth     Past Surgical History:  Procedure Laterality Date   BARTHOLIN GLAND CYST REMOVAL     COCHLEAR IMPLANT     COCHLEAR IMPLANT REMOVAL     DILATATION AND CURETTAGE/HYSTEROSCOPY WITH MINERVA N/A 03/05/2021   Procedure: DILATATION AND CURETTAGE/HYSTEROSCOPY WITH MINERVA;  Surgeon: Hildred Laser, MD;  Location: [facility];  Service: Gynecology;  Laterality: N/A;   LAPAROSCOPIC TUBAL LIGATION Bilateral 03/26/2018   Procedure: LAPAROSCOPIC TUBAL LIGATION;  Surgeon: Hildred Laser, MD;  Location: [facility] OR[facility];  Service: Gynecology;  Laterality: Bilateral;  with peritoneal biopsies   LAPAROSCOPY  MYRINGOTOMY WITH TUBE PLACEMENT     TUBAL LIGATION  03/26/2018     Current Outpatient Medications  Medication Sig Dispense Refill   AZELASTINE HCL OP Apply to eye 2 (two) times daily as needed.     BOTOX 100 units SOLR injection Inject 100 Units into the muscle every 3 (three) months.     carvedilol (COREG) 6.25 MG tablet Take 1 tablet (6.25 mg total) by mouth 2 (two) times daily. 180 tablet 1   Cholecalciferol (VITAMIN D) 50 MCG (2000 UT) CAPS Take 1 capsule by mouth daily.     Cyanocobalamin (VITAMIN B-12) 500 MCG SUBL Place 1 tablet (500 mcg  total) under the tongue once a week. 30 tablet 0   etonogestrel (NEXPLANON) 68 MG IMPL implant 68 mg by Subdermal route once. Implantation 01/31/24     fluticasone (FLONASE) 50 MCG/ACT nasal spray Place 1 spray into both nostrils daily.     folic acid (FOLVITE) 400 MCG tablet Take 800 mcg by mouth daily.     levocetirizine (XYZAL) 5 MG tablet Take 5 mg by mouth every evening.     linaclotide (LINZESS) 290 MCG CAPS capsule Take 1 capsule (290 mcg total) by mouth daily before breakfast. 30 capsule 5   MELATONIN PO Take 6 mg by mouth at bedtime.     mometasone (ELOCON) 0.1 % ointment Apply topically 2 (two) times daily.     naproxen (NAPROSYN) 500 MG tablet Take 1 tablet (500 mg total) by mouth daily as needed (take with benadryll). 90 tablet 3   pantoprazole (PROTONIX) 40 MG tablet Take 1 tablet (40 mg total) by mouth daily. No refills - needs f/up with PCP or GI 90 tablet 3   rosuvastatin (CRESTOR) 20 MG tablet Take 1 tablet (20 mg total) by mouth daily. 90 tablet 1   linaclotide (LINZESS) 145 MCG CAPS capsule Take 1 capsule (145 mcg total) by mouth daily before breakfast. (Patient not taking: Reported on 02/21/2024) 30 capsule 5   loratadine (CLARITIN) 10 MG tablet Take 10 mg by mouth daily. (Patient not taking: Reported on 02/21/2024)     No current facility-administered medications for this visit.    Allergies:   Budesonide-formoterol fumarate, Fluogen [influenza virus vaccine], Astelin [azelastine], Gabapentin, Nortriptyline, Lamictal [lamotrigine], Milnacipran, Tegaderm ag mesh [silver], Diclofenac, Duloxetine hcl, Montelukast sodium, Mupirocin, Naproxen, Naproxen sodium, Other, Oxycodone hcl, and Sulfa antibiotics    Social History:  The patient  reports that she quit smoking about 17 years ago. Her smoking use included cigarettes. She started smoking about 19 years ago. She has a 2 pack-year smoking history. She has never used smokeless tobacco. She reports current alcohol use. She reports that  she does not use drugs.   Family History:  The patient's She was adopted. Family history is unknown by patient.    ROS:  Please see the history of present illness.   Otherwise, review of systems are positive for none.   All other systems are reviewed and negative.    PHYSICAL EXAM: VS:  BP 110/82   Pulse 77   Ht 5\' 3"  (1.6 m)   Wt 191 lb (86.6 kg)   LMP 02/02/2024 (Exact Date)   SpO2 98%   BMI 33.83 kg/m  , BMI Body mass index is 33.83 kg/m. GEN: Well nourished, well developed, in no acute distress  HEENT: normal  Neck: no JVD, carotid bruits, or masses Cardiac: RRR; no murmurs, rubs, or gallops,no edema  Respiratory:  clear to auscultation bilaterally, normal work of  breathing GI: soft, nontender, nondistended, + BS MS: no deformity or atrophy  Skin: warm and dry, no rash Neuro:  Strength and sensation are intact Psych: euthymic mood, full affect   EKG:  EKG is not ordered today.    Recent Labs: 09/26/2023: ALT 19 01/08/2024: BUN 8; Creatinine, Ser 1.08; Hemoglobin 15.5; Magnesium 2.3; Platelets 414; Potassium 4.8; Sodium 141; TSH 2.580    Lipid Panel    Component Value Date/Time   CHOL 126 02/10/2023 1121   CHOL 192 10/29/2015 1050   TRIG 142 02/10/2023 1121   HDL 41 (L) 02/10/2023 1121   HDL 43 10/29/2015 1050   CHOLHDL 3.1 02/10/2023 1121   VLDL 20 10/10/2016 1235   LDLCALC 62 02/10/2023 1121      Wt Readings from Last 3 Encounters:  02/21/24 191 lb (86.6 kg)  02/19/24 193 lb 9.6 oz (87.8 kg)  02/12/24 194 lb 1.6 oz (88 kg)       ASSESSMENT AND PLAN:  ASSESSMENT & PLAN:    1.  Inappropriate sinus tachycardia: She feels significantly better since changing metoprolol to carvedilol and her blood pressure is more controlled.  Continue same medication.  I discussed with her the importance of adequate oral hydration.     2. Shortness of breath: Previous cardiac CTA was normal and echocardiogram was unremarkable.  3.  Hyperlipidemia: Lipid profile 2019  showed an LDL of 195 .  She is currently on rosuvastatin 20 mg once daily.  Most recent lipid profile showed an LDL of 62.    Disposition:   FU  in 6 months  Signed,   Lorine Bears, MD  02/21/2024 10:44 AM    [facility]

## 2024-02-21 NOTE — Patient Instructions (Signed)
 Medication Instructions:  No changes *If you need a refill on your cardiac medications before your next appointment, please call your pharmacy*   Lab Work: None ordered If you have labs (blood work) drawn today and your tests are completely normal, you will receive your results only by: MyChart Message (if you have MyChart) OR A paper copy in the mail If you have any lab test that is abnormal or we need to change your treatment, we will call you to review the results.   Testing/Procedures: None ordered   Follow-Up: At [facility], you and your health needs are our priority.  As part of our continuing mission to provide you with exceptional heart care, we have created designated Provider Care Teams.  These Care Teams include your primary Cardiologist (physician) and Advanced Practice Providers (APPs -  Physician Assistants and Nurse Practitioners) who all work together to provide you with the care you need, when you need it.  We recommend signing up for the patient portal called "MyChart".  Sign up information is provided on this After Visit Summary.  MyChart is used to connect with patients for Virtual Visits (Telemedicine).  Patients are able to view lab/test results, encounter notes, upcoming appointments, etc.  Non-urgent messages can be sent to your provider as well.   To learn more about what you can do with MyChart, go to [email or web address].    Your next appointment:   6 month(s)  Provider:   You may see Lorine Bears, MD or one of the following Advanced Practice Providers on your designated Care Team:   Nicolasa Ducking, NP Eula Listen, PA-C Cadence Fransico Michael, PA-C Charlsie Quest, NP Carlos Levering, NP

## 2024-02-27 ENCOUNTER — Ambulatory Visit: Admitting: Sleep Medicine

## 2024-03-01 NOTE — Progress Notes (Signed)
    GYNECOLOGY PROGRESS NOTE  Subjective:    Patient ID: Yesenia Wood, female    DOB: 20-Jun-1986, 38 y.o.   MRN: [id number]  [facility] ASL interpreter present for today's visit.   HPI  Patient is a 38 y.o. G0P0000 female who presents for 1 month follow up on Nexplanon. She notes that her arm is itching and she feels occasional pinching at the insertion site. Denies any rashes or lesions. She discontinued the [place] and the Aygestin after Nexplanon was inserted last month. Notes that her  bleeding is gradually getting better, she has some spotting to light bleeding.  Still noting intermittent cramping/mild pelvic pain, but this is manageable. Takes Naproxen for her fibromyalgia, and thinks this will help with her cramping as well. Reports that she feels like her weight and bloating is slowly improving.    The following portions of the patient's history were reviewed and updated as appropriate: allergies, current medications, past family history, past medical history, past social history, past surgical history, and problem list.  Review of Systems Pertinent items are noted in HPI.   Objective:   Blood pressure 108/83, pulse 81, resp. rate 16, height 5\' 3"  (1.6 m), weight 193 lb 4.8 oz (87.7 kg), last menstrual period 02/02/2024.  Body mass index is 34.24 kg/m. General appearance: alert, cooperative, and no distress Extremities: left upper extremity with no visible lesions. Small slightly red area at insertion site of Nexplanon. Implant palpable with minimal tenderness on exam.   Assessment:   1. DUB (dysfunctional uterine bleeding)   2. History of endometrial ablation   3. Other endometriosis   4. Weight gain   5. Dysmenorrhea      Plan:   Nexplanon currently in place for history of DUB despite endometrial ablation.  Also with suspected endometriosis/adenomyosis based on symptoms (although no surgical findings to correlate). Can resume Dewayne Hatch if other symptoms return  (worsening pelvic pain/dysmenorrhea), however can continue to hold at this time.  Return to clinic for any scheduled appointments or for any gynecologic concerns as needed. Up to date on routine preventative maintenance.     Hildred Laser, MD [facility]

## 2024-03-05 ENCOUNTER — Ambulatory Visit (INDEPENDENT_AMBULATORY_CARE_PROVIDER_SITE_OTHER): Admitting: Obstetrics and Gynecology

## 2024-03-05 ENCOUNTER — Encounter: Payer: Self-pay | Admitting: Obstetrics and Gynecology

## 2024-03-05 VITALS — BP 108/83 | HR 81 | Resp 16 | Ht 63.0 in | Wt 193.3 lb

## 2024-03-05 DIAGNOSIS — N946 Dysmenorrhea, unspecified: Secondary | ICD-10-CM

## 2024-03-05 DIAGNOSIS — N938 Other specified abnormal uterine and vaginal bleeding: Secondary | ICD-10-CM | POA: Diagnosis not present

## 2024-03-05 DIAGNOSIS — Z9889 Other specified postprocedural states: Secondary | ICD-10-CM

## 2024-03-05 DIAGNOSIS — R635 Abnormal weight gain: Secondary | ICD-10-CM | POA: Diagnosis not present

## 2024-03-05 DIAGNOSIS — N808 Other endometriosis: Secondary | ICD-10-CM | POA: Diagnosis not present

## 2024-03-06 ENCOUNTER — Encounter: Attending: Physical Medicine and Rehabilitation | Admitting: Physical Medicine and Rehabilitation

## 2024-03-06 VITALS — BP 114/78 | HR 85 | Ht 63.0 in | Wt 190.0 lb

## 2024-03-06 DIAGNOSIS — M797 Fibromyalgia: Secondary | ICD-10-CM | POA: Diagnosis not present

## 2024-03-06 DIAGNOSIS — M7918 Myalgia, other site: Secondary | ICD-10-CM | POA: Insufficient documentation

## 2024-03-06 DIAGNOSIS — G894 Chronic pain syndrome: Secondary | ICD-10-CM | POA: Insufficient documentation

## 2024-03-06 MED ORDER — TRIAMCINOLONE ACETONIDE 40 MG/ML IJ SUSP: 5.2000 mg | Freq: Once | INTRAMUSCULAR | Status: DC

## 2024-03-06 MED ORDER — LIDOCAINE HCL 1 % IJ SOLN: 6.0000 mL | Freq: Once | INTRAMUSCULAR | Status: DC

## 2024-03-07 ENCOUNTER — Ambulatory Visit: Admitting: Sleep Medicine

## 2024-03-13 ENCOUNTER — Encounter: Payer: Self-pay | Admitting: Physical Medicine and Rehabilitation

## 2024-03-13 DIAGNOSIS — M7918 Myalgia, other site: Secondary | ICD-10-CM

## 2024-03-13 DIAGNOSIS — M797 Fibromyalgia: Secondary | ICD-10-CM

## 2024-03-13 DIAGNOSIS — G894 Chronic pain syndrome: Secondary | ICD-10-CM

## 2024-03-13 MED ORDER — SUZETRIGINE 50 MG PO TABS: 50.0000 mg | ORAL_TABLET | Freq: Every day | ORAL | 3 refills | Status: DC

## 2024-03-13 NOTE — Progress Notes (Signed)
 HPI: Yesenia Wood is a 38 y.o. female with PMHx has Deaf; B12 deficiency; Cochlear implant status; Fatty infiltration of liver; Gastro-esophageal reflux disease without esophagitis; Generalized headache; Right-sided Bell's palsy; Hypertriglyceridemia; Irregular menstrual cycle; Overweight; Paresthesia; Disorder of labyrinth; Palpitations; Sleep disturbance; Chronic migraine without aura; Dissocial personality disorder (HCC); OSA (obstructive sleep apnea); Insomnia due to mental condition; Muteness; MDD (major depressive disorder), recurrent episode, moderate (HCC); Social anxiety disorder; Allergic rhinitis; Bartholin gland cyst; Migraine; Obesity (BMI 30-39.9); RLS (restless legs syndrome); Right sided numbness; Sleep difficulties; Positive ANA (antinuclear antibody); Fibromyalgia; Dyslipidemia; Vitamin D deficiency; Nausea; Chronic pain syndrome; Sprain of left ankle; and Acute right ankle pain on their problem list. who presents to clinic for treatment of pain related to myofascial pain  via injection as described below.    Since last visit, no changes. Still using intermittent naproxen. Asking about alternative medication options.    Physical Exam:  General: Appropriate appearance for age.  Mental Status: Appropriate mood and affect.  Cardiovascular: RRR, no m/r/g.  Respiratory: CTAB, no rales/rhonchi/wheezing.  Skin: No apparent rashes or lesions.  Neuro: Awake, alert, and oriented x3. No apparent deficits. +Deafness + Sensation to light touch decreased diffusely on the right arm and leg-unchanged Coordination: No deficits in fine motor or ataxia noted.    MSK: Moving all 4 limbs antigravity and against resistance; 4/5 throughout + TTP with palpable trigger points in bilateral cervical paraspinals, and lumbar paraspinals, trapezius muscles,  levator scapulae, and rhomboids   PROCEDURE:  Bilateral  trigger point injections Diagnosis:      ICD-10-CM    1. Myofascial pain  M79.18 lidocaine  (XYLOCAINE) 1 % (with pres) injection 6 mL      triamcinolone acetonide (KENALOG-40) injection 5.2 mg     2. Fibromyalgia  M79.7 lidocaine (XYLOCAINE) 1 % (with pres) injection 6 mL      triamcinolone acetonide (KENALOG-40) injection 5.2 mg       Goals with treatment: [ X ] Decrease pain [  ] Improve Active / Passive ROM [ x ] Improve ADLs [ ]  Improve functional mobility   MEDICATION:  [ x ] Kenalog 40 mg/mL  [ X ] Lidocaine 1%      CONSENT: Obtained in writing per policy. Consent uploaded to chart.   Benefits discussed.  Risks discussed included, but were not limited to, pain and discomfort, bleeding, bruising, allergic reaction, infection. All questions answered to patient/family member/guardian/ caregiver satisfaction. They would like to proceed with procedure. There are no noted contraindications to procedure.   PROCEDURE Time out was preformed No heat sources No antibiotics   The patient was explained about both the benefits and risks of a Bilateral  trigger point injections. After the patient acknowledged an understanding of the risks and benefits, the patient agreed to proceed. The area was first marked and then prepped in an aseptic fashion with betadine / alcohol. A 30 g, 1/2 inch needle was directed via a direct approach into the  bilateral cervical paraspinals, and lumbar paraspinals, trapezius muscles,  levator scapulae, and rhomboid muscles. The injection was completed with Kenalog 40 mg/ml 0.2 cc mixed with 6 cc of 1% lidocaine after no blood was aspirated on pull back.   No complications were encountered. The patient tolerated the procedure well.   Impression: HPI: Yesenia Wood is a 38 y.o. female with PMHx has Deaf; B12 deficiency; Cochlear implant status; Fatty infiltration of liver; Gastro-esophageal reflux disease without esophagitis; Generalized headache; Right-sided Bell's palsy; Hypertriglyceridemia; Irregular menstrual  cycle; Overweight; Paresthesia; Disorder  of labyrinth; Palpitations; Sleep disturbance; Chronic migraine without aura; Dissocial personality disorder (HCC); OSA (obstructive sleep apnea); Insomnia due to mental condition; Muteness; MDD (major depressive disorder), recurrent episode, moderate (HCC); Social anxiety disorder; Allergic rhinitis; Bartholin gland cyst; Migraine; Obesity (BMI 30-39.9); RLS (restless legs syndrome); Right sided numbness; Sleep difficulties; Positive ANA (antinuclear antibody); Fibromyalgia; Dyslipidemia; Vitamin D deficiency; Nausea; Chronic pain syndrome; Sprain of left ankle; and Acute right ankle pain on their problem list. who presents to clinic for treatment of myofascial pain . They received a  Bilateral  trigger point injections as above.   PLAN:   Myofascial pain - Resume Usual Activities. Notify Physician of any unusual bleeding, erythema or concern for side effects as reviewed above. - Apply ice prn for pain - Tylenol prn for pain - Follow up Q14m PRN for injections  - Thus far has failed Elavil due to GI side effects, as well as duloxetine, gabapentin, lyrica, and minalcipran, along with several mood stabilizers/anti-epileptics. Can tolerate Naprosyn with benadryll at nighttime intermittently, but is also causing GI side effects and itching. Patient not appropriate for opiate therapy. Will try to qualify her for suzetrigine 50 mg daily--unfortunately interacts with her nexplanon, will discuss with patient before prescribing.

## 2024-03-14 ENCOUNTER — Encounter: Payer: Self-pay | Admitting: Sleep Medicine

## 2024-03-14 ENCOUNTER — Ambulatory Visit: Admitting: Sleep Medicine

## 2024-03-14 ENCOUNTER — Other Ambulatory Visit: Payer: Self-pay | Admitting: Physical Medicine and Rehabilitation

## 2024-03-14 VITALS — BP 120/80 | HR 74 | Temp 99.0°F | Ht 63.0 in | Wt 188.0 lb

## 2024-03-14 DIAGNOSIS — Z6833 Body mass index (BMI) 33.0-33.9, adult: Secondary | ICD-10-CM

## 2024-03-14 DIAGNOSIS — F5104 Psychophysiologic insomnia: Secondary | ICD-10-CM | POA: Diagnosis not present

## 2024-03-14 DIAGNOSIS — G4733 Obstructive sleep apnea (adult) (pediatric): Secondary | ICD-10-CM

## 2024-03-14 DIAGNOSIS — Z862 Personal history of diseases of the blood and blood-forming organs and certain disorders involving the immune mechanism: Secondary | ICD-10-CM | POA: Diagnosis not present

## 2024-03-14 DIAGNOSIS — R0683 Snoring: Secondary | ICD-10-CM

## 2024-03-14 DIAGNOSIS — E66811 Obesity, class 1: Secondary | ICD-10-CM

## 2024-03-14 DIAGNOSIS — E538 Deficiency of other specified B group vitamins: Secondary | ICD-10-CM | POA: Diagnosis not present

## 2024-03-14 DIAGNOSIS — E785 Hyperlipidemia, unspecified: Secondary | ICD-10-CM | POA: Diagnosis not present

## 2024-03-14 DIAGNOSIS — E559 Vitamin D deficiency, unspecified: Secondary | ICD-10-CM | POA: Diagnosis not present

## 2024-03-14 DIAGNOSIS — E669 Obesity, unspecified: Secondary | ICD-10-CM

## 2024-03-14 NOTE — Patient Instructions (Signed)
 Marland Kitchen

## 2024-03-14 NOTE — Progress Notes (Signed)
 Name:Yesenia Wood MRN: [id number] DOB: [birth year]   CHIEF COMPLAINT:  EXCESSIVE DAYTIME SLEEPINESS   HISTORY OF PRESENT ILLNESS:  Yesenia Wood is a 38 y.o. w/ a h/o migraine headaches, insomnia, hyperlipidemia, obesity, GERD and obesity presents to follow up on insomnia. Reports difficulty with sleep initiation and sleep maintenance. States that she tried melatonin but nothing has changed. Reports c/o snoring and excessive daytime sleepiness. States that she naps daily for 1-3 hours. Reports nocturnal awakenings due to unclear reasons and occasionally has difficulty falling back to sleep. Admits to morning headaches, night sweats and dry mouth. Reports a 20-30 lb weight gain over the last few years. Denies dream enactment, cataplexy, hypnagogic or hypnapompic hallucinations. Denies a family history of sleep apnea. Denies drowsy driving. Drinks caffeine occasionally, occasional alcohol use, former smoker, denies illicit drug use.   Bedtime 9-10 pm Sleep onset 1-3 hours Rise time 7:30-8 am   EPWORTH SLEEP SCORE     10/11/2023   10:26 AM  Results of the Epworth flowsheet  Sitting and reading 0  Watching TV 0  Sitting, inactive in a public place (e.g. a theatre or a meeting) 0  As a passenger in a car for an hour without a break 3  Lying down to rest in the afternoon when circumstances permit 3  Sitting and talking to someone 0  Sitting quietly after a lunch without alcohol 0  In a car, while stopped for a few minutes in traffic 0  Total score 6     PAST MEDICAL HISTORY :   has a past medical history of ADHD (attention deficit hyperactivity disorder), B12 deficiency, Bell's palsy, Chest pain, Chronic migraine, Deaf, Depression, Dissocial personality disorder (HCC), Fatigue, Fatty liver, Fibromyalgia, GERD (gastroesophageal reflux disease), Headache, High triglycerides, Hyperglycemia, Hypertension, Hyperthyroidism, Moderate asthma, Obesity, Paresthesia, and Premature birth.   has a past surgical history that includes BARTHOLIN GLAND CYST REMOVAL; laparoscopy; Myringotomy with tube placement; Cochlear implant; Cochlear implant removal; Laparoscopic tubal ligation (Bilateral, 03/26/2018); Tubal ligation (03/26/2018); and Dilatation and curettage/hysteroscopy with minerva (N/A, 03/05/2021). Prior to Admission medications   Medication Sig Start Date End Date Taking? Authorizing Provider  AZELASTINE HCL OP Apply to eye 2 (two) times daily as needed.    [provider]  BOTOX 100 units SOLR injection Inject 100 Units into the muscle every 3 (three) months. 01/09/19   [provider]  carvedilol (COREG) 6.25 MG tablet Take 1 tablet (6.25 mg total) by mouth 2 (two) times daily. 01/25/24 04/24/24  Carlos Levering, NP  Cholecalciferol (VITAMIN D) 50 MCG (2000 UT) CAPS Take 1 capsule by mouth daily.    [provider]  Cyanocobalamin (VITAMIN B-12) 500 MCG SUBL Place 1 tablet (500 mcg total) under the tongue once a week. 04/08/20   Alba Cory, MD  etonogestrel (NEXPLANON) 68 MG IMPL implant 68 mg by Subdermal route once. Implantation 01/31/24    [provider]  fluticasone (FLONASE) 50 MCG/ACT nasal spray Place 1 spray into both nostrils daily.    [provider]  folic acid (FOLVITE) 400 MCG tablet Take 800 mcg by mouth daily.    [provider]  levocetirizine (XYZAL) 5 MG tablet Take 5 mg by mouth every evening.    [provider]  linaclotide Karlene Einstein) 145 MCG CAPS capsule Take 1 capsule (145 mcg total) by mouth daily before breakfast. Patient not taking: Reported on 03/06/2024 01/12/24 07/10/24  Celso Amy, PA-C  linaclotide [facility]) 290 MCG CAPS capsule Take  1 capsule (290 mcg total) by mouth daily before breakfast. 02/19/24 08/17/24  Luke Salaam, MD  MELATONIN PO Take 6 mg by mouth at bedtime.    [provider]  mometasone (ELOCON) 0.1 % ointment Apply topically 2 (two) times daily.    [provider]  naproxen (NAPROSYN) 500 MG tablet Take 1 tablet (500 mg total) by mouth daily as needed (take with benadryll). 06/14/23   Bea Lime, DO  pantoprazole (PROTONIX) 40 MG tablet Take 1 tablet (40 mg total) by mouth daily. No refills - needs f/up with PCP or GI 10/31/23 10/25/24  Brigitte Canard, PA-C  rosuvastatin (CRESTOR) 20 MG tablet Take 1 tablet (20 mg total) by mouth daily. 02/12/24   Sowles, Krichna, MD  Suzetrigine 50 MG TABS Take 50 mg by mouth daily at 12 noon. 03/13/24   Bea Lime, DO   Allergies  Allergen Reactions   Budesonide-Formoterol Fumarate Anaphylaxis and Other (See Comments)    Other reaction(s): Other (See Comments) chest pain Chest pain chest pain   Fluogen [Influenza Virus Vaccine] Hives    rash   Astelin [Azelastine]     Tingling, numbness, nausea   Gabapentin Nausea And Vomiting   Nortriptyline Other (See Comments)    Other reaction(s): Other (See Comments)   Lamictal [Lamotrigine]     Self injurious thoughts   Milnacipran Other (See Comments)    Pressure in eyes   Tegaderm Ag Mesh [Silver] Itching   Diclofenac Other (See Comments)    Other reaction(s): Other (See Comments)   Duloxetine Hcl Other (See Comments)    Other reaction(s): Other (See Comments) Other reaction(s): unknown   Montelukast Sodium Other (See Comments)    Other reaction(s): wheezing/sob   Mupirocin Other (See Comments)   Naproxen Other (See Comments) and Rash    Other reaction(s): rash/itching   Naproxen Sodium Rash    mild rash   Other Rash   Oxycodone Hcl Rash   Sulfa Antibiotics Hives, Rash and Itching    FAMILY HISTORY:  She was adopted. Family history is unknown by patient. SOCIAL HISTORY:  reports that she quit smoking about 17 years ago. Her smoking use included cigarettes. She started smoking about 19 years ago. She has a 2 pack-year smoking history. She has never used smokeless tobacco. She reports current alcohol use. She reports that she does not  use drugs.   Review of Systems:  Gen:  Denies  fever, sweats, chills weight loss  HEENT: Denies blurred vision, double vision, ear pain, eye pain, hearing loss, nose bleeds, sore throat Cardiac:  No dizziness, chest pain or heaviness, chest tightness,edema, No JVD Resp:   No cough, -sputum production, -shortness of breath,-wheezing, -hemoptysis,  Gi: Denies swallowing difficulty, stomach pain, nausea or vomiting, diarrhea, constipation, bowel incontinence Gu:  Denies bladder incontinence, burning urine Ext:   Denies Joint pain, stiffness or swelling Skin: Denies  skin rash, easy bruising or bleeding or hives Endoc:  Denies polyuria, polydipsia , polyphagia or weight change Psych:   Denies depression, insomnia or hallucinations  Other:  All other systems negative  VITAL SIGNS: BP 120/80 (BP Location: Right Arm, Patient Position: Sitting, Cuff Size: Normal)   Pulse 74   Temp 99 F (37.2 C) (Oral)   Ht 5\' 3"  (1.6 m)   Wt 188 lb (85.3 kg)   SpO2 97%   BMI 33.30 kg/m     Physical Examination:   General Appearance: No distress  EYES PERRLA, EOM intact.   NECK  Supple, No JVD Pulmonary: normal breath sounds, No wheezing.  CardiovascularNormal S1,S2.  No m/r/g.   Abdomen: Benign, Soft, non-tender. Skin:   warm, no rashes, no ecchymosis  Extremities: normal, no cyanosis, clubbing. Neuro:without focal findings,  speech normal  PSYCHIATRIC: Mood, affect within normal limits.   ASSESSMENT AND PLAN  OSA I suspect that OSA is likely present due significant weight gain. Discussed the consequences of untreated sleep apnea. Advised not to drive drowsy for safety of patient and others. Will complete further evaluation with a home sleep study and follow up to review results.    Obesity Counseled patient on diet and lifestyle modification.  Insomnia Counseled patient on stimulus control and improving sleep hygiene practices.   Stable, on current management. Following with PCP.     MEDICATION ADJUSTMENTS/LABS AND TESTS ORDERED: Recommend Sleep Study   Patient  satisfied with Plan of action and management. All questions answered  Follow up to review HST results and treatment plan.   I spent a total of 43 minutes reviewing chart data, face-to-face evaluation with the patient, counseling and coordination of care as detailed above.    Cyniah Gossard, M.D.  Sleep Medicine [facility]

## 2024-03-15 LAB — B12 AND FOLATE PANEL
Folate: 22.1 ng/mL
Vitamin B-12: 457 pg/mL (ref 200–1100)

## 2024-03-15 LAB — LIPID PANEL
Cholesterol: 110 mg/dL (ref <200)
HDL: 37 mg/dL — ABNORMAL LOW (ref >=50)
LDL Cholesterol (Calc): 56 mg/dL
Non-HDL Cholesterol (Calc): 73 mg/dL (ref <130)
Total CHOL/HDL Ratio: 3 (calc) (ref <5.0)
Triglycerides: 89 mg/dL (ref <150)

## 2024-03-15 LAB — IRON,TIBC AND FERRITIN PANEL
%SAT: 24 % (ref 16–45)
Ferritin: 31 ng/mL (ref 16–154)
Iron: 90 ug/dL (ref 40–190)
TIBC: 374 ug/dL (ref 250–450)

## 2024-03-15 LAB — VITAMIN D 25 HYDROXY (VIT D DEFICIENCY, FRACTURES): Vit D, 25-Hydroxy: 52 ng/mL (ref 30–100)

## 2024-03-18 ENCOUNTER — Encounter: Payer: Self-pay | Admitting: Family Medicine

## 2024-03-20 ENCOUNTER — Telehealth: Payer: Self-pay | Admitting: Obstetrics and Gynecology

## 2024-03-20 ENCOUNTER — Telehealth: Payer: Self-pay | Admitting: Physical Medicine and Rehabilitation

## 2024-03-20 NOTE — Telephone Encounter (Signed)
 Patient called asking for an update on [employer] PA. I did not see notation of one. If not, she needs an authorization.

## 2024-03-20 NOTE — Telephone Encounter (Signed)
 Patient is calling about a billing issue from her visit on 01/31/24. Patient states that they are needing more information faxed over in regards to the visit. Patient has Medicare part A & B. Is this something that we can help her with. Patient does use the sign language agency for her calls. OK to leave message or send message on MyChart if phone number doesn't go through.   Cb# [phone]

## 2024-03-22 ENCOUNTER — Ambulatory Visit (INDEPENDENT_AMBULATORY_CARE_PROVIDER_SITE_OTHER): Admitting: Family Medicine

## 2024-03-22 ENCOUNTER — Encounter: Payer: Self-pay | Admitting: Family Medicine

## 2024-03-22 ENCOUNTER — Other Ambulatory Visit (HOSPITAL_COMMUNITY)
Admission: RE | Admit: 2024-03-22 | Discharge: 2024-03-22 | Disposition: A | Discharge status: Home / Self Care | Source: Ambulatory Visit | Attending: Family Medicine | Admitting: Family Medicine

## 2024-03-22 VITALS — BP 118/74 | HR 85 | Resp 16 | Ht 63.0 in | Wt 192.1 lb

## 2024-03-22 DIAGNOSIS — L989 Disorder of the skin and subcutaneous tissue, unspecified: Secondary | ICD-10-CM

## 2024-03-22 DIAGNOSIS — D485 Neoplasm of uncertain behavior of skin: Secondary | ICD-10-CM

## 2024-03-22 DIAGNOSIS — D1801 Hemangioma of skin and subcutaneous tissue: Secondary | ICD-10-CM | POA: Diagnosis not present

## 2024-03-22 DIAGNOSIS — L905 Scar conditions and fibrosis of skin: Secondary | ICD-10-CM | POA: Diagnosis not present

## 2024-03-22 DIAGNOSIS — L859 Epidermal thickening, unspecified: Secondary | ICD-10-CM | POA: Diagnosis not present

## 2024-03-22 MED ORDER — LIDOCAINE HCL (PF) 1 % IJ SOLN
2.0000 mL | Freq: Once | INTRAMUSCULAR | Status: AC
  Administered 2024-03-22: 2 mL

## 2024-03-22 NOTE — Progress Notes (Signed)
 Name: Yesenia Wood   MRN: [id number]    DOB: 1986/10/06   Date:03/22/2024       Progress Note  Subjective  Chief Complaint  Chief Complaint  Patient presents with   Biopsy    Shave biopsy   Interpreter present today - patient is deaf  HPI   She came in today for shave biopsy of lesion that has grown fast on left lower leg but also would like to show me a lesion that bleeds intermittently on left lateral thigh , noticed it about 3 months ago. No pruritus or pain  Patient Active Problem List   Diagnosis Date Noted   Myofascial pain 10/04/2023   Right sided weakness 10/04/2023   Sprain of left ankle 05/01/2023   Chronic pain syndrome 03/03/2023   Nausea 08/12/2022   Dyslipidemia 02/10/2022   Vitamin D  deficiency 02/10/2022   Positive ANA (antinuclear antibody) 12/20/2021   Fibromyalgia 12/20/2021   Sleep difficulties 04/30/2020   Right sided numbness 12/26/2019   RLS (restless legs syndrome) 09/30/2019   MDD (major depressive disorder), recurrent episode, moderate (HCC) 07/17/2019   Social anxiety disorder 07/17/2019   Muteness 09/10/2018   OSA (obstructive sleep apnea) 10/10/2016   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   Dissocial personality disorder (HCC) 06/07/2016   Sleep disturbance 05/10/2016   Chronic migraine without aura 03/29/2016   Palpitations 06/09/2015   Deaf 05/16/2015   B12 deficiency 05/16/2015   Cochlear implant status 05/16/2015   Fatty infiltration of liver 05/16/2015   Gastro-esophageal reflux disease without esophagitis 05/16/2015   Generalized headache 05/16/2015   Right-sided Bell's palsy 05/16/2015   Hypertriglyceridemia 05/16/2015   Irregular menstrual cycle 05/16/2015   Overweight 05/16/2015   Paresthesia 05/16/2015   Disorder of labyrinth 05/16/2015   Migraine 08/05/2013   Allergic rhinitis 07/01/2013   Obesity (BMI 30-39.9) 07/01/2013   Bartholin gland cyst 09/16/2008    Social History   Tobacco Use   Smoking status:  Former    Current packs/day: 0.00    Average packs/day: 1 pack/day for 2.0 years (2.0 ttl pk-yrs)    Types: Cigarettes    Start date: 11/28/2004    Quit date: 11/28/2006    Years since quitting: 17.3   Smokeless tobacco: Never  Substance Use Topics   Alcohol use: Yes    Comment: 1 every 3 months     Current Outpatient Medications:    AZELASTINE  HCL OP, Apply to eye 2 (two) times daily as needed., Disp: , Rfl:    BOTOX 100 units SOLR injection, Inject 100 Units into the muscle every 3 (three) months., Disp: , Rfl:    carvedilol  (COREG ) 6.25 MG tablet, Take 1 tablet (6.25 mg total) by mouth 2 (two) times daily., Disp: 180 tablet, Rfl: 1   Cholecalciferol (VITAMIN D ) 50 MCG (2000 UT) CAPS, Take 1 capsule by mouth daily., Disp: , Rfl:    Cyanocobalamin  (VITAMIN B-12) 500 MCG SUBL, Place 1 tablet (500 mcg total) under the tongue once a week., Disp: 30 tablet, Rfl: 0   etonogestrel  (NEXPLANON ) 68 MG IMPL implant, 68 mg by Subdermal route once. Implantation 01/31/24, Disp: , Rfl:    fluticasone  (FLONASE) 50 MCG/ACT nasal spray, Place 1 spray into both nostrils daily., Disp: , Rfl:    folic acid  (FOLVITE ) 400 MCG tablet, Take 800 mcg by mouth daily., Disp: , Rfl:    JOURNAVX 50 MG TABS, TAKE 1 TABLET BY MOUTH DAILY AT 12 NOON., Disp: 30 tablet, Rfl: 3   levocetirizine (XYZAL)  5 MG tablet, Take 5 mg by mouth every evening., Disp: , Rfl:    linaclotide  (LINZESS ) 290 MCG CAPS capsule, Take 1 capsule (290 mcg total) by mouth daily before breakfast., Disp: 30 capsule, Rfl: 5   MELATONIN PO, Take 6 mg by mouth at bedtime., Disp: , Rfl:    mometasone (ELOCON) 0.1 % ointment, Apply topically 2 (two) times daily., Disp: , Rfl:    naproxen  (NAPROSYN ) 500 MG tablet, Take 1 tablet (500 mg total) by mouth daily as needed (take with benadryll)., Disp: 90 tablet, Rfl: 3   pantoprazole  (PROTONIX ) 40 MG tablet, Take 1 tablet (40 mg total) by mouth daily. No refills - needs f/up with PCP or GI, Disp: 90 tablet, Rfl:  3   rosuvastatin  (CRESTOR ) 20 MG tablet, Take 1 tablet (20 mg total) by mouth daily., Disp: 90 tablet, Rfl: 1  Current Facility-Administered Medications:    lidocaine  (PF) (XYLOCAINE ) 1 % injection 2 mL, 2 mL, Other, Once,    lidocaine  (XYLOCAINE ) 1 % (with pres) injection 6 mL, 6 mL, Other, Once, Engler, Morgan C, DO   triamcinolone  acetonide (KENALOG -40) injection 5.2 mg, 5.2 mg, Intramuscular, Once, Engler, Morgan C, DO  Allergies  Allergen Reactions   Budesonide-Formoterol Fumarate Anaphylaxis and Other (See Comments)    Other reaction(s): Other (See Comments) chest pain Chest pain chest pain   Influenza Virus Vaccine Hives and Other (See Comments)    rash   Astelin  [Azelastine ]     Tingling, numbness, nausea   Gabapentin Nausea And Vomiting   Nortriptyline Other (See Comments)    Other reaction(s): Other (See Comments)   Lamictal  [Lamotrigine ]     Self injurious thoughts   Milnacipran Other (See Comments)    Pressure in eyes   Montelukast Other (See Comments)   Tegaderm Ag Mesh [Silver ] Itching   Diclofenac  Other (See Comments)    Other reaction(s): Other (See Comments)   Duloxetine Hcl Other (See Comments)    Other reaction(s): Other (See Comments) Other reaction(s): unknown   Montelukast Sodium Other (See Comments)    Other reaction(s): wheezing/sob   Mupirocin Other (See Comments)   Naproxen  Other (See Comments) and Rash    Other reaction(s): rash/itching   Naproxen  Sodium Rash    mild rash   Other Rash   Oxycodone  Hcl Rash   Sulfa Antibiotics Hives, Rash and Itching   Tegaderm Alginate Ag Rope Rash    ROS  Ten systems reviewed and is negative except as mentioned in HPI    Objective  Vitals:   03/22/24 1553  BP: 118/74  Pulse: 85  Resp: 16  SpO2: 99%  Weight: 192 lb 1.6 oz (87.1 kg)  Height: 5\' 3"  (1.6 m)    Body mass index is 34.03 kg/m.  Physical Exam CONSTITUTIONAL: Patient appears well-developed and well-nourished. No distress. HEENT:  Head atraumatic, normocephalic, neck supple. CARDIOVASCULAR: Normal rate, regular rhythm and normal heart sounds. No murmur heard. No BLE edema. PULMONARY: Effort normal and breath sounds normal. No respiratory distress. MUSCULOSKELETAL: Normal gait. Without gross motor or sensory deficit. PSYCHIATRIC: Patient has a normal mood and affect. Behavior is normal. Judgment and thought content normal. SKIN: Lesion on left calf, 8x5x4 mm. Lesions on leg with unusual appearance.  Recent Results (from the past 2160 hours)  Magnesium     Status: None   Collection Time: 01/08/24  2:55 PM  Result Value Ref Range   Magnesium 2.3 1.6 - 2.3 mg/dL  TSH     Status: None  Collection Time: 01/08/24  2:55 PM  Result Value Ref Range   TSH 2.580 0.450 - 4.500 uIU/mL  Basic metabolic panel     Status: Abnormal   Collection Time: 01/08/24  2:55 PM  Result Value Ref Range   Glucose 85 70 - 99 mg/dL   BUN 8 6 - 20 mg/dL   Creatinine, Ser 1.61 (H) 0.57 - 1.00 mg/dL   eGFR 67 >09 UE/AVW/0.98   BUN/Creatinine Ratio 7 (L) 9 - 23   Sodium 141 134 - 144 mmol/L   Potassium 4.8 3.5 - 5.2 mmol/L   Chloride 103 96 - 106 mmol/L   CO2 22 20 - 29 mmol/L   Calcium  9.6 8.7 - 10.2 mg/dL  CBC     Status: Abnormal   Collection Time: 01/08/24  2:55 PM  Result Value Ref Range   WBC 10.4 3.4 - 10.8 x10E3/uL   RBC 5.40 (H) 3.77 - 5.28 x10E6/uL   Hemoglobin 15.5 11.1 - 15.9 g/dL   Hematocrit 11.9 (H) 14.7 - 46.6 %   MCV 90 79 - 97 fL   MCH 28.7 26.6 - 33.0 pg   MCHC 31.8 31.5 - 35.7 g/dL   RDW 82.9 56.2 - 13.0 %   Platelets 414 150 - 450 x10E3/uL  Lipid panel     Status: Abnormal   Collection Time: 03/14/24  9:17 AM  Result Value Ref Range   Cholesterol 110 <200 mg/dL   HDL 37 (L) > OR = 50 mg/dL   Triglycerides 89 <865 mg/dL   LDL Cholesterol (Calc) 56 mg/dL (calc)    Comment: Reference range: <100 . Desirable range <100 mg/dL for primary prevention;   <70 mg/dL for patients with CHD or diabetic patients  with  > or = 2 CHD risk factors. Aaron Aas LDL-C is now calculated using the Martin-Hopkins  calculation, which is a validated novel method providing  better accuracy than the Friedewald equation in the  estimation of LDL-C.  Melinda Sprawls et al. Erroll Heard. 7846;962(95): 2061-2068  (http://education.[facility].com/faq/FAQ164)    Total CHOL/HDL Ratio 3.0 <5.0 (calc)   Non-HDL Cholesterol (Calc) 73 <284 mg/dL (calc)    Comment: For patients with diabetes plus 1 major ASCVD risk  factor, treating to a non-HDL-C goal of <100 mg/dL  (LDL-C of <13 mg/dL) is considered a therapeutic  option.   B12 and Folate Panel     Status: None   Collection Time: 03/14/24  9:17 AM  Result Value Ref Range   Vitamin B-12 457 200 - 1,100 pg/mL   Folate 22.1 ng/mL    Comment:                            Reference Range                            Low:           <3.4                            Borderline:    3.4-5.4                            Normal:        >5.4 .   Iron, TIBC and Ferritin Panel     Status: None   Collection Time: 03/14/24  9:17 AM  Result Value Ref Range   Iron 90 40 - 190 mcg/dL   TIBC 161 096 - 045 mcg/dL (calc)   %SAT 24 16 - 45 % (calc)   Ferritin 31 16 - 154 ng/mL  VITAMIN D  25 Hydroxy (Vit-D Deficiency, Fractures)     Status: None   Collection Time: 03/14/24  9:17 AM  Result Value Ref Range   Vit D, 25-Hydroxy 52 30 - 100 ng/mL    Comment: Vitamin D  Status         25-OH Vitamin D : . Deficiency:                    <20 ng/mL Insufficiency:             20 - 29 ng/mL Optimal:                 > or = 30 ng/mL . For 25-OH Vitamin D  testing on patients on  D2-supplementation and patients for whom quantitation  of D2 and D3 fractions is required, the [facility](TM) 25-OH VIT D, (D2,D3), LC/MS/MS is recommended: order  code 40981 (patients >6yrs). . See Note 1 . Note 1 . For additional information, please refer to  http://education.[facility].com/faq/FAQ199  (This link is being  provided for informational/ educational purposes only.)     Assessment & Plan Skin lesions Two lesions: thigh irritation and rapidly growing left calf lesion (8mm x 5mm x 4mm). Differential includes bug bite or irritation. No skin cancer history. Discussed procedure risks: bleeding, scarring, cauterization effects. She was unconcerned about scarring. - Perform shave biopsy on both lesions. - Apply lidocaine  for local anesthesia. - Control bleeding with pressure or cauterization. - Apply triple antibiotic ointment to prevent Band-Aid adhesion. - Advise keeping area covered, avoid showering for 24 hours. - Instruct to avoid sweating, keep Band-Aid on for 24-48 hours. - Provide excuse for limited activity for 24 hours.

## 2024-03-26 LAB — SURGICAL PATHOLOGY

## 2024-03-28 ENCOUNTER — Telehealth: Payer: Self-pay | Admitting: Family Medicine

## 2024-03-28 NOTE — Telephone Encounter (Signed)
 Copied from CRM [id number]. Topic: Referral - Request for Referral >> Mar 28, 2024  1:51 PM Ja-Kwan M wrote: Did the patient discuss referral with their provider in the last year? Yes (If No - schedule appointment) (If Yes - send message)  Appointment offered? No  Type of order/referral and detailed reason for visit: Dermatology  Preference of office, provider, location: [facility] in [place] (patient requests female provider)  If referral order, have you been seen by this specialty before? No (If Yes, this issue or another issue? When? Where?  Can we respond through MyChart? Yes

## 2024-03-29 ENCOUNTER — Encounter

## 2024-03-29 ENCOUNTER — Encounter: Payer: Self-pay | Admitting: Family Medicine

## 2024-03-29 ENCOUNTER — Other Ambulatory Visit: Payer: Self-pay | Admitting: Family Medicine

## 2024-03-29 DIAGNOSIS — G4733 Obstructive sleep apnea (adult) (pediatric): Secondary | ICD-10-CM

## 2024-03-29 DIAGNOSIS — D229 Melanocytic nevi, unspecified: Secondary | ICD-10-CM

## 2024-03-29 DIAGNOSIS — R0681 Apnea, not elsewhere classified: Secondary | ICD-10-CM | POA: Diagnosis not present

## 2024-03-29 DIAGNOSIS — R0683 Snoring: Secondary | ICD-10-CM | POA: Diagnosis not present

## 2024-04-01 DIAGNOSIS — G43719 Chronic migraine without aura, intractable, without status migrainosus: Secondary | ICD-10-CM | POA: Diagnosis not present

## 2024-04-01 DIAGNOSIS — M542 Cervicalgia: Secondary | ICD-10-CM | POA: Diagnosis not present

## 2024-04-01 DIAGNOSIS — G518 Other disorders of facial nerve: Secondary | ICD-10-CM | POA: Diagnosis not present

## 2024-04-01 DIAGNOSIS — M791 Myalgia, unspecified site: Secondary | ICD-10-CM | POA: Diagnosis not present

## 2024-04-08 DIAGNOSIS — G4733 Obstructive sleep apnea (adult) (pediatric): Secondary | ICD-10-CM | POA: Diagnosis not present

## 2024-04-08 DIAGNOSIS — R0683 Snoring: Secondary | ICD-10-CM | POA: Diagnosis not present

## 2024-04-11 ENCOUNTER — Ambulatory Visit: Payer: Self-pay

## 2024-04-12 NOTE — Telephone Encounter (Signed)
Just an FYI.  Nothing further needed.

## 2024-04-15 ENCOUNTER — Encounter: Attending: Physical Medicine and Rehabilitation | Admitting: Physical Medicine and Rehabilitation

## 2024-04-15 VITALS — BP 103/77 | HR 86 | Ht 63.0 in | Wt 191.2 lb

## 2024-04-15 DIAGNOSIS — M797 Fibromyalgia: Secondary | ICD-10-CM | POA: Insufficient documentation

## 2024-04-15 DIAGNOSIS — M7918 Myalgia, other site: Secondary | ICD-10-CM | POA: Diagnosis not present

## 2024-04-15 MED ORDER — TRIAMCINOLONE ACETONIDE 40 MG/ML IJ SUSP
1.2000 mg | Freq: Once | INTRAMUSCULAR | Status: AC
Start: 2024-04-15 — End: 2024-04-15
  Administered 2024-04-15: 1.2 mg

## 2024-04-15 MED ORDER — LIDOCAINE HCL 1 % IJ SOLN
6.0000 mL | Freq: Once | INTRAMUSCULAR | Status: AC
  Administered 2024-04-15: 6 mL

## 2024-04-15 NOTE — Progress Notes (Signed)
 HPI: Yesenia Wood is a 38 y.o. female with PMHx has Deaf; B12 deficiency; Cochlear implant status; Fatty infiltration of liver; Gastro-esophageal reflux disease without esophagitis; Generalized headache; Right-sided Bell's palsy; Hypertriglyceridemia; Irregular menstrual cycle; Overweight; Paresthesia; Disorder of labyrinth; Palpitations; Sleep disturbance; Chronic migraine without aura; Dissocial personality disorder (HCC); OSA (obstructive sleep apnea); Insomnia due to mental condition; Muteness; MDD (major depressive disorder), recurrent episode, moderate (HCC); Social anxiety disorder; Allergic rhinitis; Bartholin gland cyst; Migraine; Obesity (BMI 30-39.9); RLS (restless legs syndrome); Right sided numbness; Sleep difficulties; Positive ANA (antinuclear antibody); Fibromyalgia; Dyslipidemia; Vitamin D  deficiency; Nausea; Chronic pain syndrome; Sprain of left ankle; and Acute right ankle pain on their problem list. who presents to clinic for treatment of pain related to myofascial pain  via injection as described below.    Since last visit, no changes. Still using intermittent naproxen .  She had talked to her pulmonologist about fatigue and they recommended another sleep study but she declined. Still waiting on PA for Journavix.      Physical Exam:  General: Appropriate appearance for age.  Mental Status: Appropriate mood and affect.  Cardiovascular: RRR, no m/r/g.  Respiratory: CTAB, no rales/rhonchi/wheezing.  Skin: No apparent rashes or lesions.  Neuro: Awake, alert, and oriented x3. No apparent deficits. +Deafness + Sensation to light touch decreased diffusely on the right arm and leg-unchanged Coordination: No deficits in fine motor or ataxia noted.    MSK: Moving all 4 limbs antigravity and against resistance; 4/5 throughout + TTP with palpable trigger points in bilateral lower cervical paraspinals, and lumbar paraspinals,  levator scapulae, and bilateral quadratus lumborum    PROCEDURE:  Bilateral  trigger point injections Diagnosis:      ICD-10-CM    1. Myofascial pain  M79.18 lidocaine  (XYLOCAINE ) 1 % (with pres) injection 6 mL      triamcinolone  acetonide (KENALOG -40) injection 5.2 mg     2. Fibromyalgia  M79.7 lidocaine  (XYLOCAINE ) 1 % (with pres) injection 6 mL      triamcinolone  acetonide (KENALOG -40) injection 5.2 mg       Goals with treatment: [ X ] Decrease pain [  ] Improve Active / Passive ROM [ x ] Improve ADLs [ ]  Improve functional mobility   MEDICATION:  [ x ] Kenalog  40 mg/mL  [ X ] Lidocaine  1%      CONSENT: Obtained in writing per policy. Consent uploaded to chart.   Benefits discussed.  Risks discussed included, but were not limited to, pain and discomfort, bleeding, bruising, allergic reaction, infection. All questions answered to patient/family member/guardian/ caregiver satisfaction. They would like to proceed with procedure. There are no noted contraindications to procedure.   PROCEDURE Time out was preformed No heat sources No antibiotics   The patient was explained about both the benefits and risks of a Bilateral  trigger point injections. After the patient acknowledged an understanding of the risks and benefits, the patient agreed to proceed. The area was first marked and then prepped in an aseptic fashion with betadine  / alcohol. A 30 g, 1/2 inch needle was directed via a direct approach into the   bilateral lower cervical paraspinals, and lumbar paraspinals,  levator scapulae, and bilateral quadratus lumborum. The injection was completed with Kenalog  40 mg/ml 0.2 cc mixed with 6 cc of 1% lidocaine  after no blood was aspirated on pull back.   No complications were encountered. The patient tolerated the procedure well.   Impression: HPI: Yesenia Wood is a 38 y.o. female with PMHx has  Deaf; B12 deficiency; Cochlear implant status; Fatty infiltration of liver; Gastro-esophageal reflux disease without esophagitis; Generalized  headache; Right-sided Bell's palsy; Hypertriglyceridemia; Irregular menstrual cycle; Overweight; Paresthesia; Disorder of labyrinth; Palpitations; Sleep disturbance; Chronic migraine without aura; Dissocial personality disorder (HCC); OSA (obstructive sleep apnea); Insomnia due to mental condition; Muteness; MDD (major depressive disorder), recurrent episode, moderate (HCC); Social anxiety disorder; Allergic rhinitis; Bartholin gland cyst; Migraine; Obesity (BMI 30-39.9); RLS (restless legs syndrome); Right sided numbness; Sleep difficulties; Positive ANA (antinuclear antibody); Fibromyalgia; Dyslipidemia; Vitamin D  deficiency; Nausea; Chronic pain syndrome; Sprain of left ankle; and Acute right ankle pain on their problem list. who presents to clinic for treatment of myofascial pain . They received a  Bilateral  trigger point injections as above.   PLAN:   Myofascial pain - Resume Usual Activities. Notify Physician of any unusual bleeding, erythema or concern for side effects as reviewed above. - Apply ice prn for pain - Tylenol  prn for pain - Follow up Q7m PRN for injections   - Thus far has failed Elavil  due to GI side effects, as well as duloxetine, gabapentin, lyrica, and minalcipran, along with several mood stabilizers/anti-epileptics. Can tolerate Naprosyn  with benadryll at nighttime intermittently, but is also causing GI side effects and itching. Patient not appropriate for opiate therapy.    - Awaiting PA fore Journavix; patient aware of interaction with birth control. I will message you when we have heard about the results of this.

## 2024-04-15 NOTE — Patient Instructions (Signed)
 Awaiting prior authorization for Journavix; patient aware of interaction with birth control. I will message you when we have heard about the results of this.   Follow up in 1 month for trigger points as needed

## 2024-04-20 ENCOUNTER — Other Ambulatory Visit: Payer: Self-pay | Admitting: Physical Medicine and Rehabilitation

## 2024-04-22 DIAGNOSIS — Z23 Encounter for immunization: Secondary | ICD-10-CM | POA: Diagnosis not present

## 2024-05-15 DIAGNOSIS — G43719 Chronic migraine without aura, intractable, without status migrainosus: Secondary | ICD-10-CM | POA: Diagnosis not present

## 2024-05-15 DIAGNOSIS — G518 Other disorders of facial nerve: Secondary | ICD-10-CM | POA: Diagnosis not present

## 2024-05-15 DIAGNOSIS — M791 Myalgia, unspecified site: Secondary | ICD-10-CM | POA: Diagnosis not present

## 2024-05-15 DIAGNOSIS — M542 Cervicalgia: Secondary | ICD-10-CM | POA: Diagnosis not present

## 2024-05-22 ENCOUNTER — Encounter: Payer: Self-pay | Admitting: Physical Medicine and Rehabilitation

## 2024-05-22 ENCOUNTER — Encounter: Attending: Physical Medicine and Rehabilitation | Admitting: Physical Medicine and Rehabilitation

## 2024-05-22 VITALS — BP 103/71 | HR 80 | Ht 63.0 in | Wt 192.8 lb

## 2024-05-22 DIAGNOSIS — G894 Chronic pain syndrome: Secondary | ICD-10-CM | POA: Insufficient documentation

## 2024-05-22 DIAGNOSIS — M797 Fibromyalgia: Secondary | ICD-10-CM | POA: Diagnosis not present

## 2024-05-22 DIAGNOSIS — M7918 Myalgia, other site: Secondary | ICD-10-CM | POA: Diagnosis not present

## 2024-05-22 MED ORDER — LIDOCAINE HCL 1 % IJ SOLN: 6.0000 mL | Freq: Once | INTRAMUSCULAR | Status: DC

## 2024-05-22 MED ORDER — TRIAMCINOLONE ACETONIDE 40 MG/ML IJ SUSP: 5.0000 mg | Freq: Once | INTRAMUSCULAR | Status: DC

## 2024-05-22 NOTE — Progress Notes (Signed)
 HPI:   Yesenia Wood is a 38 y.o. female with PMHx has Deaf; B12 deficiency; Cochlear implant status; Fatty infiltration of liver; Gastro-esophageal reflux disease without esophagitis; Generalized headache; Right-sided Bell's palsy; Hypertriglyceridemia; Irregular menstrual cycle; Overweight; Paresthesia; Disorder of labyrinth; Palpitations; Sleep disturbance; Chronic migraine without aura; Dissocial personality disorder (HCC); OSA (obstructive sleep apnea); Insomnia due to mental condition; Muteness; MDD (major depressive disorder), recurrent episode, moderate (HCC); Social anxiety disorder; Allergic rhinitis; Bartholin gland cyst; Migraine; Obesity (BMI 30-39.9); RLS (restless legs syndrome); Right sided numbness; Sleep difficulties; Positive ANA (antinuclear antibody); Fibromyalgia; Dyslipidemia; Vitamin D  deficiency; Nausea; Chronic pain syndrome; Sprain of left ankle; Myofascial pain; and Right sided weakness on their problem list. who presents to clinic for treatment of pain related to trigger point  via injection as described below.    No new concerns or complaints. No major changes in medical history since last visit.   Physical Exam:  General: Appropriate appearance for age.  Mental Status: Appropriate mood and affect.  Cardiovascular: RRR, no m/r/g.  Respiratory: CTAB, no rales/rhonchi/wheezing.  Skin: No apparent rashes or lesions.  Neuro: Awake, alert, and oriented x3. No apparent deficits.  MSK: Moving all 4 limbs antigravity and against resistance.  + TTP bilateral lower cervical paraspinasl, lumbar paraspinals, duadratus muscles, and bilateral levator scapulae  PROCEDURE: Trigger point injections Diagnosis: M79.18  Goals with treatment: [x ] Decrease pain [x]  Improve Active / Passive ROM [  ] Improve ADLs [ x ] Improve functional mobility  MEDICATION:  Kenalog  40 mg/mL - 0.2 ccs Lidocaine  1% - 6 ccs   CONSENT: Obtained in writing followed by time-out per clinic policy.  Consent uploaded to chart.  Benefits discussed.  Risks discussed included, but were not limited to, pain and discomfort, bleeding, bruising, allergic reaction, infection. All questions answered to patient/family member/guardian/ caregiver satisfaction. They would like to proceed with procedure. There are no noted contraindications to procedure.  PROCEDURE Time out was preformed No heat sources No antibiotics  The patient was explained about both the benefits and risks of a trigger point injection. After the patient acknowledged an understanding of the risks and benefits, the patient agreed to proceed. The area was first marked and then prepped in an aseptic fashion with betadine  / alcohol. A 30 g, 1/2 inch needle was directed via a posterior approach into the bilateral lower cervical paraspinasl, lumbar paraspinals, duadratus muscles, and bilateral levator scapulae. The injection was completed with Kenalog  0.2 cc mixed with 6cc of 1% lidocaine  after no blood was aspirated on pull back.  The pt tolerated the procedure well. The patient reported immediate relief of the pain and improved pain free range of motion. No complications were encountered.   Impression: HPI: Yesenia Wood is a 38 y.o. female with PMHx has Deaf; B12 deficiency; Cochlear implant status; Fatty infiltration of liver; Gastro-esophageal reflux disease without esophagitis; Generalized headache; Right-sided Bell's palsy; Hypertriglyceridemia; Irregular menstrual cycle; Overweight; Paresthesia; Disorder of labyrinth; Palpitations; Sleep disturbance; Chronic migraine without aura; Dissocial personality disorder (HCC); OSA (obstructive sleep apnea); Insomnia due to mental condition; Muteness; MDD (major depressive disorder), recurrent episode, moderate (HCC); Social anxiety disorder; Allergic rhinitis; Bartholin gland cyst; Migraine; Obesity (BMI 30-39.9); RLS (restless legs syndrome); Right sided numbness; Sleep difficulties; Positive  ANA (antinuclear antibody); Fibromyalgia; Dyslipidemia; Vitamin D  deficiency; Nausea; Chronic pain syndrome; Sprain of left ankle; Myofascial pain; and Right sided weakness on their problem list. who presents to clinic for treatment of myofascial./FM pain . They received a  Bilateral  trigger point injections as above.   PLAN: - Resume Usual Activities. Notify Physician of any unusual bleeding, erythema or concern for side effects as reviewed above. - Apply ice prn for pain - Tylenol  prn for pain - Follow up Q1M for TPI - OOP cost for Journavix not feasible; has failed all other available medication FM treatments - Continue Naproxen  PRN with benadryll  Patient/Care Giver was ready to learn without apparent learning barriers. Education was provided on diagnosis, treatment options/plan according to patient's preferred learning style. Patient/Care Giver verbalized understanding and agreement with the above plan.   Joesph JAYSON Likes, DO 05/22/2024

## 2024-05-23 ENCOUNTER — Encounter: Payer: Self-pay | Admitting: Physical Medicine and Rehabilitation

## 2024-06-10 DIAGNOSIS — G43719 Chronic migraine without aura, intractable, without status migrainosus: Secondary | ICD-10-CM | POA: Diagnosis not present

## 2024-06-19 ENCOUNTER — Other Ambulatory Visit: Payer: Self-pay | Admitting: Physical Medicine and Rehabilitation

## 2024-06-19 ENCOUNTER — Encounter: Attending: Physical Medicine and Rehabilitation | Admitting: Physical Medicine and Rehabilitation

## 2024-06-19 ENCOUNTER — Encounter: Payer: Self-pay | Admitting: Physical Medicine and Rehabilitation

## 2024-06-19 VITALS — BP 103/73 | HR 81 | Ht 63.0 in | Wt 188.0 lb

## 2024-06-19 DIAGNOSIS — M797 Fibromyalgia: Secondary | ICD-10-CM | POA: Diagnosis not present

## 2024-06-19 DIAGNOSIS — M7918 Myalgia, other site: Secondary | ICD-10-CM | POA: Diagnosis not present

## 2024-06-19 MED ORDER — TRIAMCINOLONE ACETONIDE 40 MG/ML IJ SUSP
5.0000 mg | Freq: Once | INTRAMUSCULAR | Status: AC
  Administered 2024-06-19: 5.2 mg via INTRAMUSCULAR

## 2024-06-19 MED ORDER — LIDOCAINE HCL 1 % IJ SOLN
6.0000 mL | Freq: Once | INTRAMUSCULAR | Status: AC
Start: 2024-06-19 — End: 2024-06-19
  Administered 2024-06-19: 6 mL

## 2024-06-19 NOTE — Patient Instructions (Signed)
-   Resume Usual Activities. Notify Physician of any unusual bleeding, erythema or concern for side effects as reviewed above. - Apply ice prn for pain - Tylenol  prn for pain - Follow up Q1M for TPI - OOP cost for Journavix not feasible; has failed all other available medication FM treatments--given coupon for Journavix to bring to pharmacy today - Continue Naproxen  PRN with benadryll

## 2024-06-19 NOTE — Progress Notes (Signed)
 HPI:   Yesenia Wood is a 38 y.o. female with PMHx has Deaf; B12 deficiency; Cochlear implant status; Fatty infiltration of liver; Gastro-esophageal reflux disease without esophagitis; Generalized headache; Right-sided Bell's palsy; Hypertriglyceridemia; Irregular menstrual cycle; Overweight; Paresthesia; Disorder of labyrinth; Palpitations; Sleep disturbance; Chronic migraine without aura; Dissocial personality disorder (HCC); OSA (obstructive sleep apnea); Insomnia due to mental condition; Muteness; MDD (major depressive disorder), recurrent episode, moderate (HCC); Social anxiety disorder; Allergic rhinitis; Bartholin gland cyst; Migraine; Obesity (BMI 30-39.9); RLS (restless legs syndrome); Right sided numbness; Sleep difficulties; Positive ANA (antinuclear antibody); Fibromyalgia; Dyslipidemia; Vitamin D  deficiency; Nausea; Chronic pain syndrome; Sprain of left ankle; Myofascial pain; and Right sided weakness on their problem list. who presents to clinic for treatment of pain related to trigger point  via injection as described below.     No new concerns or complaints. No major changes in medical history since last visit.    Physical Exam:  General: Appropriate appearance for age.  Mental Status: Appropriate mood and affect.  Cardiovascular: RRR, no m/r/g.  Respiratory: CTAB, no rales/rhonchi/wheezing.  Skin: No apparent rashes or lesions.  Neuro: Awake, alert, and oriented x3. No apparent deficits.  MSK: Moving all 4 limbs antigravity and against resistance.  + TTP bilateral lower cervical paraspinals, lumbar paraspinals, quadratus muscles, and bilateral levator scapulae   PROCEDURE: Trigger point injections Diagnosis: M79.18  Goals with treatment: [x ] Decrease pain [x]  Improve Active / Passive ROM [  ] Improve ADLs [ x ] Improve functional mobility   MEDICATION:  Kenalog  40 mg/mL - 0.2 ccs Lidocaine  1% - 6 ccs     CONSENT: Obtained in writing followed by time-out per clinic  policy. Consent uploaded to chart.   Benefits discussed.  Risks discussed included, but were not limited to, pain and discomfort, bleeding, bruising, allergic reaction, infection. All questions answered to patient/family member/guardian/ caregiver satisfaction. They would like to proceed with procedure. There are no noted contraindications to procedure.   PROCEDURE Time out was preformed No heat sources No antibiotics   The patient was explained about both the benefits and risks of a trigger point injection. After the patient acknowledged an understanding of the risks and benefits, the patient agreed to proceed. The area was first marked and then prepped in an aseptic fashion with betadine  / alcohol. A 30 g, 1/2 inch needle was directed via a posterior approach into the bilateral lower cervical paraspinals, lumbar paraspinals, quadratus muscles, and bilateral levator scapulae. The injection was completed with Kenalog  0.2 cc mixed with 6cc of 1% lidocaine  after no blood was aspirated on pull back.   The pt tolerated the procedure well. The patient reported immediate relief of the pain and improved pain free range of motion. No complications were encountered.    Impression: HPI: Yesenia Wood is a 38 y.o. female with PMHx has Deaf; B12 deficiency; Cochlear implant status; Fatty infiltration of liver; Gastro-esophageal reflux disease without esophagitis; Generalized headache; Right-sided Bell's palsy; Hypertriglyceridemia; Irregular menstrual cycle; Overweight; Paresthesia; Disorder of labyrinth; Palpitations; Sleep disturbance; Chronic migraine without aura; Dissocial personality disorder (HCC); OSA (obstructive sleep apnea); Insomnia due to mental condition; Muteness; MDD (major depressive disorder), recurrent episode, moderate (HCC); Social anxiety disorder; Allergic rhinitis; Bartholin gland cyst; Migraine; Obesity (BMI 30-39.9); RLS (restless legs syndrome); Right sided numbness; Sleep  difficulties; Positive ANA (antinuclear antibody); Fibromyalgia; Dyslipidemia; Vitamin D  deficiency; Nausea; Chronic pain syndrome; Sprain of left ankle; Myofascial pain; and Right sided weakness on their problem list. who presents to clinic for  treatment of myofascial./FM pain . They received a  Bilateral  trigger point injections as above.    PLAN: - Resume Usual Activities. Notify Physician of any unusual bleeding, erythema or concern for side effects as reviewed above. - Apply ice prn for pain - Tylenol  prn for pain - Follow up Q1M for TPI - OOP cost for Journavix not feasible; has failed all other available medication FM treatments--given coupon for Journavix to bring to pharmacy today - Continue Naproxen  PRN with benadryll   Patient/Care Giver was ready to learn without apparent learning barriers. Education was provided on diagnosis, treatment options/plan according to patient's preferred learning style. Patient/Care Giver verbalized understanding and agreement with the above plan.

## 2024-06-20 ENCOUNTER — Telehealth: Payer: Self-pay | Admitting: Physical Medicine and Rehabilitation

## 2024-06-20 NOTE — Telephone Encounter (Signed)
 Patient informing us  that her pharmacy is requesting additional information in order to fill JOURNAVX  50 MG TABS.   Pharmacy is [id number] [place], [place] - [address] [phone]

## 2024-06-21 ENCOUNTER — Encounter: Payer: Self-pay | Admitting: Physical Medicine and Rehabilitation

## 2024-06-21 NOTE — Telephone Encounter (Signed)
 Coupon and drug rep card sent to pharmacy.

## 2024-07-03 DIAGNOSIS — M542 Cervicalgia: Secondary | ICD-10-CM | POA: Diagnosis not present

## 2024-07-03 DIAGNOSIS — G518 Other disorders of facial nerve: Secondary | ICD-10-CM | POA: Diagnosis not present

## 2024-07-03 DIAGNOSIS — M791 Myalgia, unspecified site: Secondary | ICD-10-CM | POA: Diagnosis not present

## 2024-07-03 DIAGNOSIS — G43719 Chronic migraine without aura, intractable, without status migrainosus: Secondary | ICD-10-CM | POA: Diagnosis not present

## 2024-07-17 ENCOUNTER — Encounter: Attending: Physical Medicine and Rehabilitation | Admitting: Physical Medicine and Rehabilitation

## 2024-07-17 VITALS — BP 111/82 | HR 76 | Ht 63.0 in | Wt 189.8 lb

## 2024-07-17 DIAGNOSIS — M7918 Myalgia, other site: Secondary | ICD-10-CM

## 2024-07-17 DIAGNOSIS — M797 Fibromyalgia: Secondary | ICD-10-CM | POA: Diagnosis not present

## 2024-07-17 MED ORDER — TRIAMCINOLONE ACETONIDE 40 MG/ML IJ SUSP
5.0000 mg | Freq: Once | INTRAMUSCULAR | Status: AC
  Administered 2024-07-17: 5.2 mg via INTRAMUSCULAR

## 2024-07-17 MED ORDER — LIDOCAINE HCL 1 % IJ SOLN
6.0000 mL | Freq: Once | INTRAMUSCULAR | Status: AC
  Administered 2024-07-17: 6 mL

## 2024-07-17 NOTE — Progress Notes (Signed)
 HPI:   Yesenia Wood is a 38 y.o. female with PMHx has Deaf; B12 deficiency; Cochlear implant status; Fatty infiltration of liver; Gastro-esophageal reflux disease without esophagitis; Generalized headache; Right-sided Bell's palsy; Hypertriglyceridemia; Irregular menstrual cycle; Overweight; Paresthesia; Disorder of labyrinth; Palpitations; Sleep disturbance; Chronic migraine without aura; Dissocial personality disorder (HCC); OSA (obstructive sleep apnea); Insomnia due to mental condition; Muteness; MDD (major depressive disorder), recurrent episode, moderate (HCC); Social anxiety disorder; Allergic rhinitis; Bartholin gland cyst; Migraine; Obesity (BMI 30-39.9); RLS (restless legs syndrome); Right sided numbness; Sleep difficulties; Positive ANA (antinuclear antibody); Fibromyalgia; Dyslipidemia; Vitamin D  deficiency; Nausea; Chronic pain syndrome; Sprain of left ankle; Myofascial pain; and Right sided weakness on their problem list. who presents to clinic for treatment of pain related to trigger point  via injection as described below.     No new concerns or complaints. No major changes in medical history since last visit.    Physical Exam:  General: Appropriate appearance for age.  Mental Status: Appropriate mood and affect.  Cardiovascular: RRR, no m/r/g.  Respiratory: CTAB, no rales/rhonchi/wheezing.  Skin: No apparent rashes or lesions.  Neuro: Awake, alert, and oriented x3. No apparent deficits.  MSK: Moving all 4 limbs antigravity and against resistance.  + TTP bilateral lower cervical paraspinals, lumbar paraspinals, bilateral deltoid, and bilateral gluteus muscles   PROCEDURE: Trigger point injections Diagnosis: M79.18  Goals with treatment: [x ] Decrease pain [x]  Improve Active / Passive ROM [  ] Improve ADLs [ x ] Improve functional mobility   MEDICATION:  Kenalog  40 mg/mL - 0.2 ccs Lidocaine  1% - 6 ccs     CONSENT: Obtained in writing followed by time-out per clinic  policy. Consent uploaded to chart.   Benefits discussed.  Risks discussed included, but were not limited to, pain and discomfort, bleeding, bruising, allergic reaction, infection. All questions answered to patient/family member/guardian/ caregiver satisfaction. They would like to proceed with procedure. There are no noted contraindications to procedure.   PROCEDURE Time out was preformed No heat sources No antibiotics   The patient was explained about both the benefits and risks of a trigger point injection. After the patient acknowledged an understanding of the risks and benefits, the patient agreed to proceed. The area was first marked and then prepped in an aseptic fashion with betadine  / alcohol. A 30 g, 1/2 inch needle was directed via a posterior approach into the bilateral lower cervical paraspinals, lumbar paraspinals, bilateral deltoid, and bilateral gluteus muscles. The injection was completed with Kenalog  0.2 cc mixed with 6cc of 1% lidocaine  after no blood was aspirated on pull back.   The pt tolerated the procedure well. The patient reported immediate relief of the pain and improved pain free range of motion. No complications were encountered.    Impression: HPI: [name] is a 38 y.o. female with PMHx has Deaf; B12 deficiency; Cochlear implant status; Fatty infiltration of liver; Gastro-esophageal reflux disease without esophagitis; Generalized headache; Right-sided Bell's palsy; Hypertriglyceridemia; Irregular menstrual cycle; Overweight; Paresthesia; Disorder of labyrinth; Palpitations; Sleep disturbance; Chronic migraine without aura; Dissocial personality disorder (HCC); OSA (obstructive sleep apnea); Insomnia due to mental condition; Muteness; MDD (major depressive disorder), recurrent episode, moderate (HCC); Social anxiety disorder; Allergic rhinitis; Bartholin gland cyst; Migraine; Obesity (BMI 30-39.9); RLS (restless legs syndrome); Right sided numbness; Sleep difficulties;  Positive ANA (antinuclear antibody); Fibromyalgia; Dyslipidemia; Vitamin D  deficiency; Nausea; Chronic pain syndrome; Sprain of left ankle; Myofascial pain; and Right sided weakness on their problem list. who presents to clinic for  treatment of myofascial./FM pain . They received a  Bilateral  trigger point injections as above.    PLAN: - Resume Usual Activities. Notify Physician of any unusual bleeding, erythema or concern for side effects as reviewed above. - Apply ice prn for pain - Tylenol  prn for pain - Follow up Q1M for TPI - OOP cost for Journavix not feasible; has failed all other available medication FM treatments  - Continue Naproxen  PRN with benadryll   Patient/Care Giver was ready to learn without apparent learning barriers. Education was provided on diagnosis, treatment options/plan according to patient's preferred learning style. Patient/Care Giver verbalized understanding and agreement with the above plan.

## 2024-07-17 NOTE — Patient Instructions (Signed)
-   Resume Usual Activities. Notify Physician of any unusual bleeding, erythema or concern for side effects as reviewed above. - Apply ice prn for pain - Tylenol  prn for pain - Follow up Q1M for TPI - OOP cost for Journavix not feasible; has failed all other available medication FM treatments  - Continue Naproxen  PRN with benadryll

## 2024-07-21 ENCOUNTER — Other Ambulatory Visit: Payer: Self-pay | Admitting: Family Medicine

## 2024-07-21 ENCOUNTER — Other Ambulatory Visit: Payer: Self-pay | Admitting: Student

## 2024-07-21 DIAGNOSIS — E785 Hyperlipidemia, unspecified: Secondary | ICD-10-CM

## 2024-07-22 ENCOUNTER — Telehealth: Payer: Self-pay

## 2024-07-22 ENCOUNTER — Other Ambulatory Visit: Payer: Self-pay | Admitting: Family Medicine

## 2024-07-22 DIAGNOSIS — E785 Hyperlipidemia, unspecified: Secondary | ICD-10-CM

## 2024-07-22 NOTE — Telephone Encounter (Signed)
 Called pt back and advised she should have enough till 08/14/24.  rosuvastatin  (CRESTOR ) 20 MG tablet 90 tablet 1 02/12/2024 --   Sig - Route: Take 1 tablet (20 mg total) by mouth daily. - Oral   Sent to pharmacy as: rosuvastatin  (CRESTOR ) 20 MG tablet   E-Prescribing Status: Receipt confirmed by pharmacy (02/12/2024  9:20 AM EDT)

## 2024-07-22 NOTE — Telephone Encounter (Signed)
 Copied from CRM [id number]. Topic: Clinical - Prescription Issue >> Jul 22, 2024  9:43 AM Gustabo D wrote: Yesenia Wood  (CRESTOR ) 20 MG tablet - patient says her prescription was denied she wants to know why

## 2024-08-06 ENCOUNTER — Encounter: Payer: Self-pay | Admitting: Physical Medicine and Rehabilitation

## 2024-08-07 ENCOUNTER — Telehealth: Payer: Self-pay | Admitting: Physician Assistant

## 2024-08-07 NOTE — Telephone Encounter (Signed)
 Good morning Yesenia Wood,  Patient called stating that she wanted to schedule an appointment with you. She states she was a patient of yours when you were with [facility]. Will you please review and advise on scheduling?  Thank you

## 2024-08-14 DIAGNOSIS — M791 Myalgia, unspecified site: Secondary | ICD-10-CM | POA: Diagnosis not present

## 2024-08-14 DIAGNOSIS — M542 Cervicalgia: Secondary | ICD-10-CM | POA: Diagnosis not present

## 2024-08-14 DIAGNOSIS — G518 Other disorders of facial nerve: Secondary | ICD-10-CM | POA: Diagnosis not present

## 2024-08-14 DIAGNOSIS — G43719 Chronic migraine without aura, intractable, without status migrainosus: Secondary | ICD-10-CM | POA: Diagnosis not present

## 2024-08-15 ENCOUNTER — Ambulatory Visit: Admitting: Family Medicine

## 2024-08-21 ENCOUNTER — Encounter: Attending: Physical Medicine and Rehabilitation | Admitting: Physical Medicine and Rehabilitation

## 2024-08-21 VITALS — BP 113/76 | HR 81 | Ht 63.0 in | Wt 186.0 lb

## 2024-08-21 DIAGNOSIS — M797 Fibromyalgia: Secondary | ICD-10-CM | POA: Diagnosis not present

## 2024-08-21 DIAGNOSIS — M7918 Myalgia, other site: Secondary | ICD-10-CM

## 2024-08-21 MED ORDER — LIDOCAINE HCL 1 % IJ SOLN: 5.0000 mL | Freq: Once | INTRAMUSCULAR | Status: AC

## 2024-08-21 MED ORDER — TRIAMCINOLONE ACETONIDE 40 MG/ML IJ SUSP: 5.0000 mg | Freq: Once | INTRAMUSCULAR | Status: AC

## 2024-08-21 MED ORDER — METHYLPREDNISOLONE 4 MG PO TBPK: ORAL_TABLET | ORAL | 0 refills | Status: DC

## 2024-08-21 NOTE — Progress Notes (Signed)
 HPI:   Yesenia Wood is a 38 y.o. female with PMHx has Deaf; B12 deficiency; Cochlear implant status; Fatty infiltration of liver; Gastro-esophageal reflux disease without esophagitis; Generalized headache; Right-sided Bell's palsy; Hypertriglyceridemia; Irregular menstrual cycle; Overweight; Paresthesia; Disorder of labyrinth; Palpitations; Sleep disturbance; Chronic migraine without aura; Dissocial personality disorder (HCC); OSA (obstructive sleep apnea); Insomnia due to mental condition; Muteness; MDD (major depressive disorder), recurrent episode, moderate (HCC); Social anxiety disorder; Allergic rhinitis; Bartholin gland cyst; Migraine; Obesity (BMI 30-39.9); RLS (restless legs syndrome); Right sided numbness; Sleep difficulties; Positive ANA (antinuclear antibody); Fibromyalgia; Dyslipidemia; Vitamin D  deficiency; Nausea; Chronic pain syndrome; Sprain of left ankle; Myofascial pain; and Right sided weakness on their problem list. who presents to clinic for treatment of pain related to trigger point  via injection as described below.    Still in fibro flare - pain all over. Shoulders and lower back worse. Has had a hard time walking due ot sharp pain in her butt; lifting her arms above her head is painful. Had to really limit activities. She has been sleeping better at night but is also napping during the day; her dog wakes her up too. Denies any increased stressors or new medications; she does say her dad had a fall and he needed stitches recently which was mildly helpful.   She is trying to set up counselling with [facility] but has not hear back yet; last called Monday.   Physical Exam:  General: Appropriate appearance for age.  Mental Status: Appropriate mood and affect.  Cardiovascular: RRR, no m/r/g.  Respiratory: CTAB, no rales/rhonchi/wheezing.  Skin: No apparent rashes or lesions.  Neuro: Awake, alert, and oriented x3. No apparent deficits.  MSK: Moving all 4 limbs antigravity and against  resistance.  + TTP bilateral lower cervical paraspinals, bilateral trapezius, bilateral lower lumbar paraspinals, bilateral deltoid, bilateral biceps    PROCEDURE: Trigger point injections Diagnosis: M79.18  Goals with treatment: [x ] Decrease pain [x]  Improve Active / Passive ROM [  ] Improve ADLs [ x ] Improve functional mobility   MEDICATION:  Kenalog  40 mg/mL - 0.2 ccs Lidocaine  1% - 6 ccs     CONSENT: Obtained in writing followed by time-out per clinic policy. Consent uploaded to chart.   Benefits discussed.  Risks discussed included, but were not limited to, pain and discomfort, bleeding, bruising, allergic reaction, infection. All questions answered to patient/family member/guardian/ caregiver satisfaction. They would like to proceed with procedure. There are no noted contraindications to procedure.   PROCEDURE Time out was preformed No heat sources No antibiotics   The patient was explained about both the benefits and risks of a trigger point injection. After the patient acknowledged an understanding of the risks and benefits, the patient agreed to proceed. The area was first marked and then prepped in an aseptic fashion with betadine  / alcohol. A 30 g, 1/2 inch needle was directed via a posterior approach into the bilateral lower cervical paraspinals, bilateral trapezius, bilateral lower lumbar paraspinals, bilateral deltoid, bilateral biceps; bilateral gluteus injections were performed with 27 g, 1.5 in needle. The injection was completed with Kenalog  0.2 cc mixed with 6cc of 1% lidocaine  after no blood was aspirated on pull back.   The pt tolerated the procedure well. The patient reported immediate relief of the pain and improved pain free range of motion. No complications were encountered.    Impression: HPI: Yesenia Wood is a 38 y.o. female with PMHx has Deaf; B12 deficiency; Cochlear implant status; Fatty infiltration of  liver; Gastro-esophageal reflux disease without  esophagitis; Generalized headache; Right-sided Bell's palsy; Hypertriglyceridemia; Irregular menstrual cycle; Overweight; Paresthesia; Disorder of labyrinth; Palpitations; Sleep disturbance; Chronic migraine without aura; Dissocial personality disorder (HCC); OSA (obstructive sleep apnea); Insomnia due to mental condition; Muteness; MDD (major depressive disorder), recurrent episode, moderate (HCC); Social anxiety disorder; Allergic rhinitis; Bartholin gland cyst; Migraine; Obesity (BMI 30-39.9); RLS (restless legs syndrome); Right sided numbness; Sleep difficulties; Positive ANA (antinuclear antibody); Fibromyalgia; Dyslipidemia; Vitamin D  deficiency; Nausea; Chronic pain syndrome; Sprain of left ankle; Myofascial pain; and Right sided weakness on their problem list. who presents to clinic for treatment of myofascial./FM pain . They received a  Bilateral  trigger point injections as above.    PLAN: - Resume Usual Activities. Notify Physician of any unusual bleeding, erythema or concern for side effects as reviewed above. - Apply ice prn for pain - Tylenol  prn for pain - Follow up Q1M for TPI - OOP cost for Journavix not feasible; has failed all other available medication FM treatments  - Medrol  Dosepak prescribed for ongoing fibro=myalgia flare; advised these can not be done more than 3x per year - Continue Naproxen  PRN with benadryll   Patient/Care Giver was ready to learn without apparent learning barriers. Education was provided on diagnosis, treatment options/plan according to patient's preferred learning style. Patient/Care Giver verbalized understanding and agreement with the above plan.

## 2024-08-22 ENCOUNTER — Ambulatory Visit: Payer: Self-pay

## 2024-08-22 DIAGNOSIS — Z0000 Encounter for general adult medical examination without abnormal findings: Secondary | ICD-10-CM | POA: Diagnosis not present

## 2024-08-22 NOTE — Telephone Encounter (Signed)
 Left message for patient to call back and schedule.

## 2024-08-22 NOTE — Patient Instructions (Addendum)
 [name],  Thank you for taking the time for your Medicare Wellness Visit. I appreciate your continued commitment to your health goals. Please review the care plan we discussed, and feel free to reach out if I can assist you further.  Medicare recommends these wellness visits once per year to help you and your care team stay ahead of potential health issues. These visits are designed to focus on prevention, allowing your provider to concentrate on managing your acute and chronic conditions during your regular appointments.  Please note that Annual Wellness Visits do not include a physical exam. Some assessments may be limited, especially if the visit was conducted virtually. If needed, we may recommend a separate in-person follow-up with your provider.  Ongoing Care Seeing your primary care provider every 3 to 6 months helps us  monitor your health and provide consistent, personalized care.   Referrals If a referral was made during today's visit and you haven't received any updates within two weeks, please contact the referred provider directly to check on the status.  Recommended Screenings:  Health Maintenance  Topic Date Due   Hepatitis B Vaccine (1 of 3 - 19+ 3-dose series) Never done   HPV Vaccine (1 - Risk 3-dose SCDM series) Never done   Flu Shot  06/28/2024   COVID-19 Vaccine (7 - 2025-26 season) 07/29/2024   Medicare Annual Wellness Visit  08/22/2025   DTaP/Tdap/Td vaccine (3 - Td or Tdap) 03/16/2026   Pap with HPV screening  03/17/2026   Pneumococcal Vaccine  Completed   Hepatitis C Screening  Completed   HIV Screening  Completed   Meningitis B Vaccine  Aged Out     Advance Care Planning is important because it: Ensures you receive medical care that aligns with your values, goals, and preferences. Provides guidance to your family and loved ones, reducing the emotional burden of decision-making during critical moments.  Vision: Annual vision screenings are recommended  for early detection of glaucoma, cataracts, and diabetic retinopathy. These exams can also reveal signs of chronic conditions such as diabetes and high blood pressure.  Dental: Annual dental screenings help detect early signs of oral cancer, gum disease, and other conditions linked to overall health, including heart disease and diabetes.  Please see the attached documents for additional preventive care recommendations.   NEXT AWV 08/28/25 @ 11:30 AM BY PHONE

## 2024-08-22 NOTE — Progress Notes (Signed)
 Subjective:   Yesenia Wood is a 38 y.o. who presents for a Medicare Wellness preventive visit.  As a reminder, Annual Wellness Visits don't include a physical exam, and some assessments may be limited, especially if this visit is performed virtually. We may recommend an in-person follow-up visit with your provider if needed.  Visit Complete: Virtual I connected with  Glendine J Fitzmaurice on 08/22/24 by a audio enabled telemedicine application and verified that I am speaking with the correct person using two identifiers.  Patient Location: Home  Provider Location: Home Office  I discussed the limitations of evaluation and management by telemedicine. The patient expressed understanding and agreed to proceed.  Vital Signs: Because this visit was a virtual/telehealth visit, some criteria may be missing or patient reported. Any vitals not documented were not able to be obtained and vitals that have been documented are patient reported.  VideoDeclined- This patient declined Librarian, academic. Therefore the visit was completed with audio only.  Persons Participating in Visit: Patient. & SIGN LANGUAGE INTERPRETER  AWV Questionnaire: No: Patient Medicare AWV questionnaire was not completed prior to this visit.  Cardiac Risk Factors include: advanced age (>38men, >64 women);dyslipidemia;obesity (BMI >30kg/m2)     Objective:    Today's Vitals   08/22/24 1133  PainSc: 4    There is no height or weight on file to calculate BMI.     08/22/2024   11:49 AM 06/24/2023   10:09 AM 03/10/2023    9:41 AM 02/10/2022   11:03 AM 07/18/2021    6:17 PM 07/17/2021   12:58 PM 03/05/2021   11:38 AM  Advanced Directives  Does Patient Have a Medical Advance Directive? No No No No No No No  Would patient like information on creating a medical advance directive? No - Patient declined  No - Patient declined Yes (MAU/Ambulatory/Procedural Areas - Information given)   No - Patient  declined    Current Medications (verified) Outpatient Encounter Medications as of 08/22/2024  Medication Sig   BOTOX 100 units SOLR injection Inject 100 Units into the muscle every 3 (three) months.   carvedilol  (COREG ) 6.25 MG tablet TAKE 1 TABLET BY MOUTH TWICE A DAY   Cholecalciferol (VITAMIN D ) 50 MCG (2000 UT) CAPS Take 1 capsule by mouth daily.   EMGALITY 120 MG/ML SOAJ    etonogestrel  (NEXPLANON ) 68 MG IMPL implant 68 mg by Subdermal route once. Implantation 01/31/24   fluticasone  (FLONASE) 50 MCG/ACT nasal spray Place 1 spray into both nostrils daily.   folic acid  (FOLVITE ) 400 MCG tablet Take 800 mcg by mouth daily.   levocetirizine (XYZAL) 5 MG tablet Take 5 mg by mouth every evening.   linaclotide  (LINZESS ) 290 MCG CAPS capsule Take 1 capsule (290 mcg total) by mouth daily before breakfast.   MELATONIN PO Take 6 mg by mouth at bedtime.   methylPREDNISolone  (MEDROL  DOSEPAK) 4 MG TBPK tablet take as instructed on packaging   naproxen  (NAPROSYN ) 500 MG tablet TAKE 1 TABLET (500 MG TOTAL) BY MOUTH DAILY AS NEEDED (TAKE WITH BENADRYLL).   pantoprazole  (PROTONIX ) 40 MG tablet Take 1 tablet (40 mg total) by mouth daily. No refills - needs f/up with PCP or GI   rosuvastatin  (CRESTOR ) 20 MG tablet TAKE 1 TABLET BY MOUTH EVERY DAY   AZELASTINE  HCL OP Apply to eye 2 (two) times daily as needed. (Patient not taking: Reported on 08/22/2024)   Cyanocobalamin  (VITAMIN B-12) 500 MCG SUBL Place 1 tablet (500 mcg total) under the  tongue once a week. (Patient not taking: Reported on 08/22/2024)   JOURNAVX  50 MG TABS TAKE 1 TABLET BY MOUTH DAILY AT 12 NOON. (Patient not taking: Reported on 08/22/2024)   Facility-Administered Encounter Medications as of 08/22/2024  Medication   lidocaine  (XYLOCAINE ) 1 % (with pres) injection 5 mL   lidocaine  (XYLOCAINE ) 1 % (with pres) injection 6 mL   lidocaine  (XYLOCAINE ) 1 % (with pres) injection 6 mL   triamcinolone  acetonide (KENALOG -40) injection 5.2 mg    triamcinolone  acetonide (KENALOG -40) injection 5.2 mg   triamcinolone  acetonide (KENALOG -40) injection 5.2 mg    Allergies (verified) Budesonide-formoterol fumarate, Influenza virus vaccine, Astelin  [azelastine ], Gabapentin, Nortriptyline, Lamictal  [lamotrigine ], Milnacipran, Montelukast, Tegaderm ag mesh [silver ], Diclofenac , Duloxetine hcl, Montelukast sodium, Mupirocin, Naproxen , Naproxen  sodium, Other, Oxycodone  hcl, Sulfa antibiotics, and Tegaderm alginate ag rope   History: Past Medical History:  Diagnosis Date   ADHD (attention deficit hyperactivity disorder)    B12 deficiency    Bell's palsy    right sided   Chest pain    a. 05/2015 ETT: elev HR/BP, but no acute ST/T changes; b. 09/2019 Cor CTA: Nl cors, Ca2+ = 0.   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   Fatigue    Fatty liver    Fibromyalgia    GERD (gastroesophageal reflux disease)    Headache    High triglycerides    Hyperglycemia    Hypertension    Hyperthyroidism    Moderate asthma    Obesity    Paresthesia    Premature birth    Past Surgical History:  Procedure Laterality Date   BARTHOLIN GLAND CYST REMOVAL     COCHLEAR IMPLANT     COCHLEAR IMPLANT REMOVAL     DILATATION AND CURETTAGE/HYSTEROSCOPY WITH MINERVA N/A 03/05/2021   Procedure: DILATATION AND CURETTAGE/HYSTEROSCOPY WITH MINERVA;  Surgeon: Connell Davies, MD;  Location: [facility];  Service: Gynecology;  Laterality: N/A;   LAPAROSCOPIC TUBAL LIGATION Bilateral 03/26/2018   Procedure: LAPAROSCOPIC TUBAL LIGATION;  Surgeon: Connell Davies, MD;  Location: [facility];  Service: Gynecology;  Laterality: Bilateral;  with peritoneal biopsies   LAPAROSCOPY     MYRINGOTOMY WITH TUBE PLACEMENT     TUBAL LIGATION  03/26/2018   Family History  Adopted: Yes  Family history unknown: Yes   Social History   Socioeconomic History   Marital status: Single    Spouse name: Not  on file   Number of children: 0   Years of education: Not on file   Highest education level: Some college, no degree  Occupational History   Occupation: disability for hearing loss  Tobacco Use   Smoking status: Former    Current packs/day: 0.00    Average packs/day: 1 pack/day for 2.0 years (2.0 ttl pk-yrs)    Types: Cigarettes    Start date: 11/28/2004    Quit date: 11/28/2006    Years since quitting: 17.7   Smokeless tobacco: Never  Vaping Use   Vaping status: Never Used  Substance and Sexual Activity   Alcohol use: Yes    Comment: 1 every 3 months   Drug use: No   Sexual activity: Not Currently    Birth control/protection: Pill    Comment: Seasonale  Other Topics Concern   Not on file  Social History Narrative   Lives with her adopted  parents   Hearing loss since birth    Social  Drivers of Health   Financial Resource Strain: Low Risk  (08/22/2024)   Overall Financial Resource Strain (CARDIA)    Difficulty of Paying Living Expenses: Not hard at all  Food Insecurity: No Food Insecurity (08/22/2024)   Hunger Vital Sign    Worried About Running Out of Food in the Last Year: Never true    Ran Out of Food in the Last Year: Never true  Transportation Needs: No Transportation Needs (08/22/2024)   PRAPARE - Administrator, Civil Service (Medical): No    Lack of Transportation (Non-Medical): No  Physical Activity: Sufficiently Active (08/22/2024)   Exercise Vital Sign    Days of Exercise per Week: 3 days    Minutes of Exercise per Session: 60 min  Stress: No Stress Concern Present (08/22/2024)   [employer] of Occupational Health - Occupational Stress Questionnaire    Feeling of Stress: Not at all  Social Connections: Socially Isolated (08/22/2024)   Social Connection and Isolation Panel    Frequency of Communication with Friends and Family: More than three times a week    Frequency of Social Gatherings with Friends and Family: More than three times a week     Attends Religious Services: Never    Database administrator or Organizations: No    Attends Engineer, structural: Never    Marital Status: Never married    Tobacco Counseling Counseling given: Not Answered    Clinical Intake:  Pre-visit preparation completed: Yes  Pain : 0-10 Pain Score: 4  Pain Type: Chronic pain Pain Location: Hip Pain Orientation: Right Pain Radiating Towards: HAS FIBROMYALGIA & BURSITIS IN HIP Pain Descriptors / Indicators: Aching, Constant Pain Onset: More than a month ago Pain Frequency: Constant     BMI - recorded: 32.9 Nutritional Status: BMI > 30  Obese Nutritional Risks: None Diabetes: No  Lab Results  Component Value Date   HGBA1C 5.6 02/10/2023   HGBA1C 4.9 01/29/2018   HGBA1C 5.0 10/10/2016     How often do you need to have someone help you when you read instructions, pamphlets, or other written materials from your doctor or pharmacy?: 1 - Never  Interpreter Needed?: No  Information entered by :: JHONNIE DAS, LPN   Activities of Daily Living     08/22/2024   11:52 AM  In your present state of health, do you have any difficulty performing the following activities:  Hearing? 1  Vision? 0  Difficulty concentrating or making decisions? 0  Walking or climbing stairs? 1  Comment SOMETIMES  Dressing or bathing? 0  Doing errands, shopping? 1  Preparing Food and eating ? N  Using the Toilet? N  In the past six months, have you accidently leaked urine? N  Do you have problems with loss of bowel control? N  Managing your Medications? N  Managing your Finances? N  Housekeeping or managing your Housekeeping? N    Patient Care Team: Sowles, Krichna, MD as PCP - General (Family Medicine) Darron Deatrice LABOR, MD as PCP - Cardiology (Cardiology) Darron Deatrice LABOR, MD as Consulting Physician (Cardiology) Connell Davies, MD (Inactive) as Referring Physician (Obstetrics and Gynecology) Eappen, Saramma, MD as Consulting Physician  (Psychiatry) Oneita Na, MD as Referring Physician (Neurology) Jeannetta Lonni ORN, MD as Consulting Physician (Rheumatology) [facility], [facility] (Optometry)  I have updated your Care Teams any recent Medical Services you may have received from other providers in the past year.     Assessment:   This is  a routine wellness examination for Abigial.  Hearing/Vision screen Hearing Screening - Comments:: NO AIDS- HAS APPT W/ HEARING MD AGAIN Vision Screening - Comments:: WEARS CONTACTS/GLASSES- [facility]   Goals Addressed             This Visit's Progress    DIET - EAT MORE FRUITS AND VEGETABLES         Depression Screen     08/22/2024   11:46 AM 06/19/2024   10:28 AM 05/22/2024   10:47 AM 03/22/2024    3:53 PM 02/12/2024    9:00 AM 02/07/2024   10:49 AM 01/10/2024    9:57 AM  PHQ 2/9 Scores  PHQ - 2 Score 1 2 2  0 0 0 0  PHQ- 9 Score 2  5 0 0      Fall Risk     08/22/2024   11:51 AM 06/19/2024   10:28 AM 05/22/2024   10:46 AM 02/12/2024    9:00 AM 02/07/2024   10:48 AM  Fall Risk   Falls in the past year? 1 0 1 1 1   Number falls in past yr: 1 0 1 1 1   Comment     01/26/24  Injury with Fall? 0 0 0 1 1  Comment     no tx sought  Risk for fall due to : Impaired balance/gait   Impaired balance/gait   Follow up Falls evaluation completed;Falls prevention discussed   Education provided;Falls evaluation completed;Falls prevention discussed     MEDICARE RISK AT HOME:  Medicare Risk at Home Any stairs in or around the home?: Yes If so, are there any without handrails?: No Home free of loose throw rugs in walkways, pet beds, electrical cords, etc?: Yes Adequate lighting in your home to reduce risk of falls?: Yes Life alert?: No Use of a cane, walker or w/c?: Yes (CANE WHEN BALANCE IS OFF) Grab bars in the bathroom?: Yes Shower chair or bench in shower?: Yes Elevated toilet seat or a handicapped toilet?: Yes  TIMED UP AND GO:  Was the test performed?   No  Cognitive Function: 6CIT completed        08/22/2024   11:57 AM 06/30/2023    3:54 PM  6CIT Screen  What Year? 0 points 0 points  What month? 0 points 0 points  What time? 0 points 0 points  Count back from 20 0 points 0 points  Months in reverse 4 points 4 points  Repeat phrase 4 points 6 points  Total Score 8 points 10 points    Immunizations Immunization History  Administered Date(s) Administered   Influenza, Mdck, Trivalent,PF 6+ MOS(egg free) 08/18/2023   Influenza,inj,Quad PF,6+ Mos 07/20/2015, 09/01/2017, 09/25/2018, 08/09/2019, 09/08/2020, 07/26/2021, 08/12/2022   Influenza-Unspecified 09/12/2014, 08/24/2016   PFIZER Comirnaty(Gray Top)Covid-19 Tri-Sucrose Vaccine 03/03/2020, 03/24/2020, 10/30/2020, 05/21/2021   PNEUMOCOCCAL CONJUGATE-20 07/26/2021   Pfizer Covid-19 Vaccine Bivalent Booster 60yrs & up 11/02/2021   Pfizer(Comirnaty)Fall Seasonal Vaccine 12 years and older 09/19/2022   Pneumococcal Conjugate-13 11/24/2014   Pneumococcal Polysaccharide-23 11/24/2014   Tdap 11/28/2009, 03/16/2016    Screening Tests Health Maintenance  Topic Date Due   Hepatitis B Vaccines 19-59 Average Risk (1 of 3 - 19+ 3-dose series) Never done   HPV VACCINES (1 - Risk 3-dose SCDM series) Never done   Influenza Vaccine  06/28/2024   COVID-19 Vaccine (7 - 2025-26 season) 07/29/2024   Medicare Annual Wellness (AWV)  08/22/2025   DTaP/Tdap/Td (3 - Td or Tdap) 03/16/2026   Cervical Cancer Screening (  HPV/Pap Cotest)  03/17/2026   Pneumococcal Vaccine  Completed   Hepatitis C Screening  Completed   HIV Screening  Completed   Meningococcal B Vaccine  Aged Out    Health Maintenance Items Addressed: NEEDS FLU, COVID  Additional Screening:  Vision Screening: Recommended annual ophthalmology exams for early detection of glaucoma and other disorders of the eye. Is the patient up to date with their annual eye exam?  Yes  Who is the provider or what is the name of the office in which  the patient attends annual eye exams? [facility]  Dental Screening: Recommended annual dental exams for proper oral hygiene  Community Resource Referral / Chronic Care Management: CRR required this visit?  No   CCM required this visit?  No   Plan:    I have personally reviewed and noted the following in the patient's chart:   Medical and social history Use of alcohol, tobacco or illicit drugs  Current medications and supplements including opioid prescriptions. Patient is not currently taking opioid prescriptions. Functional ability and status Nutritional status Physical activity Advanced directives List of other physicians Hospitalizations, surgeries, and ER visits in previous 12 months Vitals Screenings to include cognitive, depression, and falls Referrals and appointments  In addition, I have reviewed and discussed with patient certain preventive protocols, quality metrics, and best practice recommendations. A written personalized care plan for preventive services as well as general preventive health recommendations were provided to patient.   Jhonnie GORMAN Das, LPN   [id number]   After Visit Summary: (MyChart) Due to this being a telephonic visit, the after visit summary with patients personalized plan was offered to patient via MyChart   Notes: Nothing significant to report at this time.

## 2024-09-06 ENCOUNTER — Encounter: Payer: Self-pay | Admitting: Family Medicine

## 2024-09-12 ENCOUNTER — Ambulatory Visit: Admitting: Family Medicine

## 2024-09-12 ENCOUNTER — Encounter: Payer: Self-pay | Admitting: Family Medicine

## 2024-09-12 ENCOUNTER — Other Ambulatory Visit (HOSPITAL_COMMUNITY): Payer: Self-pay

## 2024-09-12 VITALS — BP 114/76 | HR 89 | Resp 16 | Ht 63.0 in | Wt 186.1 lb

## 2024-09-12 DIAGNOSIS — Z862 Personal history of diseases of the blood and blood-forming organs and certain disorders involving the immune mechanism: Secondary | ICD-10-CM | POA: Diagnosis not present

## 2024-09-12 DIAGNOSIS — H1045 Other chronic allergic conjunctivitis: Secondary | ICD-10-CM | POA: Insufficient documentation

## 2024-09-12 DIAGNOSIS — K582 Mixed irritable bowel syndrome: Secondary | ICD-10-CM

## 2024-09-12 DIAGNOSIS — K760 Fatty (change of) liver, not elsewhere classified: Secondary | ICD-10-CM | POA: Diagnosis not present

## 2024-09-12 DIAGNOSIS — E785 Hyperlipidemia, unspecified: Secondary | ICD-10-CM

## 2024-09-12 DIAGNOSIS — E538 Deficiency of other specified B group vitamins: Secondary | ICD-10-CM | POA: Diagnosis not present

## 2024-09-12 DIAGNOSIS — M797 Fibromyalgia: Secondary | ICD-10-CM | POA: Diagnosis not present

## 2024-09-12 DIAGNOSIS — K219 Gastro-esophageal reflux disease without esophagitis: Secondary | ICD-10-CM

## 2024-09-12 DIAGNOSIS — F331 Major depressive disorder, recurrent, moderate: Secondary | ICD-10-CM

## 2024-09-12 DIAGNOSIS — J302 Other seasonal allergic rhinitis: Secondary | ICD-10-CM

## 2024-09-12 DIAGNOSIS — J3089 Other allergic rhinitis: Secondary | ICD-10-CM | POA: Diagnosis not present

## 2024-09-12 DIAGNOSIS — G43709 Chronic migraine without aura, not intractable, without status migrainosus: Secondary | ICD-10-CM | POA: Diagnosis not present

## 2024-09-12 MED ORDER — AUVELITY 45-105 MG PO TBCR: 1.0000 | EXTENDED_RELEASE_TABLET | Freq: Every day | ORAL | 0 refills | Status: DC

## 2024-09-12 NOTE — Progress Notes (Signed)
 Name: Yesenia Wood   MRN: [id number]    DOB: [birth year]   Date:09/12/2024       Progress Note  Subjective  Chief Complaint  Chief Complaint  Patient presents with   Medical Management of Chronic Issues   Discussed the use of AI scribe software for clinical note transcription with the patient, who gave verbal consent to proceed.  History of Present Illness Yesenia Wood is a 38 year old female who presents for a six-month follow-up.  WE used a virtual interpreter - Darnita [phone] ( hearing loss/ uses sign language)   She experiences headaches when reading text-heavy materials, which is a new symptom. An eye exam is scheduled in December.  She describes episodes of diarrhea that began one to two weeks ago, occurring when she overexerts herself. She has a history of IBS with chronic constipation and bloating, and has previously tried the FODMAP diet, pantoprazole , famotidine , and Zofran . She also takes Linzess .  She has a history of allergic reactions to the flu shot, including hives, even with the egg-free version. Itching was noted with previous flu shots, but hives have developed over the past couple of years.  She continues to experience migraines almost daily, though they are less severe and shorter in duration. She receives Botox every three months and has started Emgality injections monthly. Despite these treatments, severe migraines still occur occasionally.  She experiences palpitations one to three times a week, usually in the afternoon or evening. She takes carvedilol  6.25 mg for this and reports associated shortness of breath and chest pain during episodes.  She is on rosuvastatin  for dyslipidemia, with her last LDL cholesterol level at 56.  She has fibromyalgia, with pain that improved with muscle injections and naproxen , but worsened in the fourth week post-treatment. A severe flare-up occurred in August, treated with prednisone , but she is not back to her baseline pain  level.  She has major depressive disorder but is not currently on medication due to previous ineffectiveness and adverse effects. She has not seen her psychiatrist recently and is looking for a new counselor. She is open to therapy, phq 9 is very high today   She has asthma and has experienced some cough and shortness of breath, but does not currently use an inhaler due to adverse reactions to previous inhalers like albuterol .   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   Dissocial personality disorder (HCC) 06/07/2016   Sleep disturbance 05/10/2016   Chronic migraine without aura 03/29/2016   Palpitations 06/09/2015   Deaf 05/16/2015   B12 deficiency 05/16/2015   Cochlear implant status 05/16/2015   Fatty infiltration of liver 05/16/2015   Gastro-esophageal reflux disease without esophagitis 05/16/2015   Generalized headache 05/16/2015   Right-sided Bell's palsy 05/16/2015   Irregular menstrual cycle 05/16/2015   Paresthesia 05/16/2015   Disorder of labyrinth 05/16/2015   Migraine 08/05/2013   Allergic rhinitis 07/01/2013   Obesity (BMI 30-39.9) 07/01/2013   Bartholin gland cyst 09/16/2008    Past Surgical History:  Procedure Laterality Date  BARTHOLIN GLAND CYST REMOVAL     COCHLEAR IMPLANT     COCHLEAR IMPLANT REMOVAL     DILATATION AND CURETTAGE/HYSTEROSCOPY WITH MINERVA N/A 03/05/2021   Procedure: DILATATION AND CURETTAGE/HYSTEROSCOPY WITH MINERVA;  Surgeon: Connell Davies, MD;  Location: [facility];   Service: Gynecology;  Laterality: N/A;   LAPAROSCOPIC TUBAL LIGATION Bilateral 03/26/2018   Procedure: LAPAROSCOPIC TUBAL LIGATION;  Surgeon: Connell Davies, MD;  Location: [facility];  Service: Gynecology;  Laterality: Bilateral;  with peritoneal biopsies   LAPAROSCOPY     MYRINGOTOMY WITH TUBE PLACEMENT     TUBAL LIGATION  03/26/2018    Family History  Adopted: Yes  Family history unknown: Yes    Social History   Tobacco Use   Smoking status: Former    Current packs/day: 0.00    Average packs/day: 1 pack/day for 2.0 years (2.0 ttl pk-yrs)    Types: Cigarettes    Start date: 11/28/2004    Quit date: 11/28/2006    Years since quitting: 17.8   Smokeless tobacco: Never  Substance Use Topics   Alcohol use: Yes    Comment: 1 every 3 months     Current Outpatient Medications:    BOTOX 100 units SOLR injection, Inject 100 Units into the muscle every 3 (three) months., Disp: , Rfl:    carvedilol  (COREG ) 6.25 MG tablet, TAKE 1 TABLET BY MOUTH TWICE A DAY, Disp: 180 tablet, Rfl: 3   Cholecalciferol (VITAMIN D ) 50 MCG (2000 UT) CAPS, Take 1 capsule by mouth daily., Disp: , Rfl:    Cyanocobalamin  (VITAMIN B-12) 500 MCG SUBL, Place 1 tablet (500 mcg total) under the tongue once a week., Disp: 30 tablet, Rfl: 0   Dextromethorphan-buPROPion  ER (AUVELITY) 45-105 MG TBCR, Take 1 tablet by mouth daily., Disp: 90 tablet, Rfl: 0   EMGALITY 120 MG/ML SOAJ, , Disp: , Rfl:    etonogestrel  (NEXPLANON ) 68 MG IMPL implant, 68 mg by Subdermal route once. Implantation 01/31/24, Disp: , Rfl:    fluticasone  (FLONASE) 50 MCG/ACT nasal spray, Place 1 spray into both nostrils daily., Disp: , Rfl:    folic acid  (FOLVITE ) 400 MCG tablet, Take 800 mcg by mouth daily., Disp: , Rfl:    levocetirizine (XYZAL) 5 MG tablet, Take 5 mg by mouth every evening., Disp: , Rfl:    linaclotide  (LINZESS ) 290 MCG CAPS capsule, Take 1 capsule (290 mcg total) by mouth daily before breakfast., Disp: 30 capsule, Rfl: 5   MELATONIN PO,  Take 6 mg by mouth at bedtime., Disp: , Rfl:    naproxen  (NAPROSYN ) 500 MG tablet, TAKE 1 TABLET (500 MG TOTAL) BY MOUTH DAILY AS NEEDED (TAKE WITH BENADRYLL)., Disp: 90 tablet, Rfl: 3   pantoprazole  (PROTONIX ) 40 MG tablet, Take 1 tablet (40 mg total) by mouth daily. No refills - needs f/up with PCP or GI, Disp: 90 tablet, Rfl: 3   rosuvastatin  (CRESTOR ) 20 MG tablet, TAKE 1 TABLET BY MOUTH EVERY DAY, Disp: 90 tablet, Rfl: 0   AZELASTINE  HCL OP, Apply to eye 2 (two) times daily as needed. (Patient not taking: Reported on 09/12/2024), Disp: , Rfl:   Current Facility-Administered Medications:    lidocaine  (XYLOCAINE ) 1 % (with pres) injection 5 mL, 5 mL, Other, Once,    triamcinolone  acetonide (KENALOG -40) injection 5.2 mg, 5.2 mg, Intramuscular, Once,   Allergies  Allergen Reactions   Budesonide-Formoterol Fumarate Anaphylaxis and Other (See Comments)    Other reaction(s): Other (See Comments) chest pain Chest pain chest pain   Influenza Virus Vaccine  Hives and Other (See Comments)    rash   Astelin  [Azelastine ]     Tingling, numbness, nausea   Gabapentin Nausea And Vomiting   Nortriptyline Other (See Comments)    Other reaction(s): Other (See Comments)   Lamictal  [Lamotrigine ]     Self injurious thoughts   Milnacipran Other (See Comments)    Pressure in eyes   Montelukast Other (See Comments)   Tegaderm Ag Mesh [Silver ] Itching   Diclofenac  Other (See Comments)    Other reaction(s): Other (See Comments)   Duloxetine Hcl Other (See Comments)    Other reaction(s): Other (See Comments) Other reaction(s): unknown   Montelukast Sodium Other (See Comments)    Other reaction(s): wheezing/sob   Mupirocin Other (See Comments)   Other Rash   Oxycodone  Hcl Rash   Sulfa Antibiotics Hives, Rash and Itching   Tegaderm Alginate Ag Rope Rash    I personally reviewed active problem list, medication list, allergies with the patient/caregiver today.   ROS  Ten systems reviewed and is  negative except as mentioned in HPI    Objective Physical Exam CONSTITUTIONAL: Patient appears well-developed and well-nourished. No distress. HEENT: Head atraumatic, normocephalic, neck supple. CARDIOVASCULAR: Normal rate, regular rhythm and normal heart sounds. No murmur heard. No BLE edema. PULMONARY: Effort normal and breath sounds normal. Lungs clear to auscultation. No respiratory distress. ABDOMINAL: There is no tenderness or distention. MUSCULOSKELETAL: Normal gait. Without gross motor or sensory deficit. Trigger points present, more in upper back and chest. PSYCHIATRIC: Patient has a normal mood and affect. Behavior is normal. Judgment and thought content normal.  Vitals:   09/12/24 1046  BP: 114/76  Pulse: 89  Resp: 16  SpO2: 99%  Weight: 186 lb 1.6 oz (84.4 kg)  Height: 5' 3 (1.6 m)    Body mass index is 32.97 kg/m.   PHQ2/9:    09/12/2024   11:18 AM 08/22/2024   11:46 AM 06/19/2024   10:28 AM 05/22/2024   10:47 AM 03/22/2024    3:53 PM  Depression screen PHQ 2/9  Decreased Interest 1 0 1 1 0  Down, Depressed, Hopeless 1 1 1 1  0  PHQ - 2 Score 2 1 2 2  0  Altered sleeping 3 0  1 0  Tired, decreased energy 3 1  1  0  Change in appetite 1 0   0  Feeling bad or failure about yourself  1 0  0 0  Trouble concentrating 2 0  1 0  Moving slowly or fidgety/restless 1 0  0 0  Suicidal thoughts 0 0  0 0  PHQ-9 Score 13 2  5  0  Difficult doing work/chores Very difficult Not difficult at all   Not difficult at all    phq 9 is negative  Fall Risk:    09/12/2024   10:32 AM 08/22/2024   11:51 AM 06/19/2024   10:28 AM 05/22/2024   10:46 AM 02/12/2024    9:00 AM  Fall Risk   Falls in the past year? 1 1 0 1 1  Number falls in past yr: 0 1 0 1 1  Injury with Fall? 0 0 0 0 1  Risk for fall due to : Impaired balance/gait Impaired balance/gait   Impaired balance/gait  Follow up Falls evaluation completed Falls evaluation completed;Falls prevention discussed   Education  provided;Falls evaluation completed;Falls prevention discussed     Assessment & Plan Chronic migraine without aura Chronic migraines occur almost daily. Botox and Emgality provide partial relief, reducing severity  but not frequency. - Continue Botox injections every three months. - Continue Emgality 120 mg monthly.  Fibromyalgia Chronic pain with recent flare-up causing significant functional impairment. Managed with trigger point injections and naproxen . Prednisone  taper provided limited relief. - Continue trigger point injections. - Continue naproxen  as needed.  Irritable bowel syndrome, mixed type Recent increase in diarrhea episodes likely related to IBS. No prior gastroenterology consultation for diarrhea episodes. - Inform gastroenterologist about recent diarrhea episodes.  Palpitations Palpitations occur 1-3 times daily, often in the evening, managed with carvedilol . Episodes associated with shortness of breath and chest pain. - Continue carvedilol  6.25 mg.  Asthma Mild intermittent  Occasional cough and shortness of breath. No current inhaler use due to adverse reactions.  Hyperlipidemia Well-controlled with rosuvastatin . Last LDL cholesterol level was 56 mg/dL. - Continue rosuvastatin .  Major depressive disorder, recurrent, moderate Current symptoms include stress and limited interest in activities. PHQ-9 score of 13 indicates moderate depression. Previous medications were ineffective or caused adverse effects. Discussed starting Auvility. - Start Auvility for depression. - Follow up in three months to assess response to medication.  Flu Vaccination Allergy  Hives with flu vaccine, including egg-free version. Advised against further flu vaccinations due to risk of severe allergic reaction. - Advise use of masks during flu season. - Remove flu vaccine reminders from pharmacy records.

## 2024-09-13 ENCOUNTER — Telehealth: Payer: Self-pay | Admitting: Pharmacy Technician

## 2024-09-13 ENCOUNTER — Other Ambulatory Visit (HOSPITAL_COMMUNITY): Payer: Self-pay

## 2024-09-13 ENCOUNTER — Other Ambulatory Visit: Payer: Self-pay | Admitting: Family Medicine

## 2024-09-13 DIAGNOSIS — F331 Major depressive disorder, recurrent, moderate: Secondary | ICD-10-CM

## 2024-09-13 NOTE — Telephone Encounter (Signed)
 Pharmacy Patient Advocate Encounter  Received notification from [facility] MEDICAID that Prior Authorization for Auvelity 45-105MG  er tablets has been DENIED.  Full denial letter will be uploaded to the media tab. See denial reason below. I sent a list of all the meds she tried in the past but apparently her plan wants tried within the last 180 days atleast a minimum of 1 day trial, then we can try PA for [facility] again    PA #/Case ID/Reference #: [id number]

## 2024-09-13 NOTE — Telephone Encounter (Signed)
 Pharmacy Patient Advocate Encounter   Received notification from CoverMyMeds that prior authorization for Auvelity 45-105MG  er tablets is required/requested.   Insurance verification completed.   The patient is insured through [facility] MEDICAID.   Per test claim: PA required; PA submitted to above mentioned insurance via Latent Key/confirmation #/EOC [id number] Status is pending

## 2024-09-16 ENCOUNTER — Other Ambulatory Visit (HOSPITAL_COMMUNITY): Payer: Self-pay

## 2024-09-16 ENCOUNTER — Encounter: Payer: Self-pay | Admitting: Physical Medicine and Rehabilitation

## 2024-09-16 ENCOUNTER — Encounter: Attending: Physical Medicine and Rehabilitation | Admitting: Physical Medicine and Rehabilitation

## 2024-09-16 VITALS — BP 114/81 | HR 92 | Ht 63.0 in | Wt 187.4 lb

## 2024-09-16 DIAGNOSIS — M797 Fibromyalgia: Secondary | ICD-10-CM | POA: Insufficient documentation

## 2024-09-16 DIAGNOSIS — M7918 Myalgia, other site: Secondary | ICD-10-CM | POA: Diagnosis not present

## 2024-09-16 MED ORDER — LIDOCAINE HCL 1 % IJ SOLN
6.0000 mL | Freq: Once | INTRAMUSCULAR | Status: AC
  Administered 2024-09-16: 6 mL

## 2024-09-16 MED ORDER — TRIAMCINOLONE ACETONIDE 40 MG/ML IJ SUSP
5.0000 mg | Freq: Once | INTRAMUSCULAR | Status: AC
  Administered 2024-09-16: 5.2 mg

## 2024-09-16 MED ORDER — CYCLOBENZAPRINE HCL 5 MG PO TABS: 5.0000 mg | ORAL_TABLET | Freq: Three times a day (TID) | ORAL | 0 refills | Status: DC | PRN

## 2024-09-16 NOTE — Progress Notes (Signed)
 HPI:   Yesenia Wood is a 38 y.o. female with PMHx has Deaf; B12 deficiency; Cochlear implant status; Fatty infiltration of liver; Gastro-esophageal reflux disease without esophagitis; Generalized headache; Right-sided Bell's palsy; Hypertriglyceridemia; Irregular menstrual cycle; Overweight; Paresthesia; Disorder of labyrinth; Palpitations; Sleep disturbance; Chronic migraine without aura; Dissocial personality disorder (HCC); OSA (obstructive sleep apnea); Insomnia due to mental condition; Muteness; MDD (major depressive disorder), recurrent episode, moderate (HCC); Social anxiety disorder; Allergic rhinitis; Bartholin gland cyst; Migraine; Obesity (BMI 30-39.9); RLS (restless legs syndrome); Right sided numbness; Sleep difficulties; Positive ANA (antinuclear antibody); Fibromyalgia; Dyslipidemia; Vitamin D  deficiency; Nausea; Chronic pain syndrome; Sprain of left ankle; Myofascial pain; and Right sided weakness on their problem list. who presents to clinic for treatment of pain related to trigger point  via injection as described below.    Still with increasingly frequent fibromyalgia flares. Feels all over but worst in her lower back and buttocks. Steroid pack did minimal improvement. She notes no increase in activity, but some adjustments as she is switching antidepressants with her PCP around the same time. Would like to try flexeril  again.     Physical Exam:  General: Appropriate appearance for age.  Mental Status: Appropriate mood and affect.  Cardiovascular: RRR, no m/r/g.  Respiratory: CTAB, no rales/rhonchi/wheezing.  Skin: No apparent rashes or lesions.  Neuro: Awake, alert, and oriented x3. No apparent deficits.  MSK: Moving all 4 limbs antigravity and against resistance.  + TTP bilateral lower cervical paraspinals, bilateral rhomboids, bilateral lower lumbar paraspinals, bilateral gluteus maximus   PROCEDURE: Trigger point injections Diagnosis: M79.18  Goals with treatment: [x ]  Decrease pain [x]  Improve Active / Passive ROM [  ] Improve ADLs [ x ] Improve functional mobility   MEDICATION:  Kenalog  40 mg/mL - 0.2 ccs Lidocaine  1% - 6 ccs     CONSENT: Obtained in writing followed by time-out per clinic policy. Consent uploaded to chart.   Benefits discussed.  Risks discussed included, but were not limited to, pain and discomfort, bleeding, bruising, allergic reaction, infection. All questions answered to patient/family member/guardian/ caregiver satisfaction. They would like to proceed with procedure. There are no noted contraindications to procedure.   PROCEDURE Time out was preformed No heat sources No antibiotics   The patient was explained about both the benefits and risks of a trigger point injection. After the patient acknowledged an understanding of the risks and benefits, the patient agreed to proceed. The area was first marked and then prepped in an aseptic fashion with betadine  / alcohol. A 30 g, 1/2 inch needle was directed via a posterior approach into the bilateral lower cervical paraspinals, bilateral rhomboids, bilateral lower lumbar paraspinals; bilateral gluteus injections were performed with 27 g, 1.5 in needle. The injection was completed with Kenalog  0.2 cc mixed with 6cc of 1% lidocaine  after no blood was aspirated on pull back.   The pt tolerated the procedure well. The patient reported immediate relief of the pain and improved pain free range of motion. No complications were encountered.    Impression: HPI: Yesenia Wood is a 38 y.o. female with PMHx has Deaf; B12 deficiency; Cochlear implant status; Fatty infiltration of liver; Gastro-esophageal reflux disease without esophagitis; Generalized headache; Right-sided Bell's palsy; Hypertriglyceridemia; Irregular menstrual cycle; Overweight; Paresthesia; Disorder of labyrinth; Palpitations; Sleep disturbance; Chronic migraine without aura; Dissocial personality disorder (HCC); OSA (obstructive  sleep apnea); Insomnia due to mental condition; Muteness; MDD (major depressive disorder), recurrent episode, moderate (HCC); Social anxiety disorder; Allergic rhinitis; Bartholin gland cyst;  Migraine; Obesity (BMI 30-39.9); RLS (restless legs syndrome); Right sided numbness; Sleep difficulties; Positive ANA (antinuclear antibody); Fibromyalgia; Dyslipidemia; Vitamin D  deficiency; Nausea; Chronic pain syndrome; Sprain of left ankle; Myofascial pain; and Right sided weakness on their problem list. who presents to clinic for treatment of myofascial./FM pain . They received a  Bilateral  trigger point injections as above.    PLAN: - Resume Usual Activities. Notify Physician of any unusual bleeding, erythema or concern for side effects as reviewed above. - Apply ice prn for pain - Tylenol  prn for pain - Follow up Q1M for TPI - OOP cost for Journavix not feasible; has failed all other available medication FM treatments  - Re-trial flexeril  5 mg TID PRN - Discussed how recent depression medication changes likely playing into worsening fibromyalgia; encouraged her in getting new therapist and working on med adjustments with her PCP   Patient/Care Yesenia Wood was ready to learn without apparent learning barriers. Education was provided on diagnosis, treatment options/plan according to patient's preferred learning style. Patient/Care Giver verbalized understanding and agreement with the above plan.

## 2024-09-16 NOTE — Telephone Encounter (Signed)
 Our Appeal's RPH is looking into this, we will update as soon as we have more information.  Thanks!

## 2024-09-16 NOTE — Patient Instructions (Signed)
-   Resume Usual Activities. Notify Physician of any unusual bleeding, erythema or concern for side effects as reviewed above. - Apply ice prn for pain - Tylenol  prn for pain - Follow up Q1M for TPI - OOP cost for Journavix not feasible; has failed all other available medication FM treatments  - Re-trial flexeril  5 mg TID PRN - Discussed how recent depression medication changes likely playing into worsening fibromyalgia; encouraged her in getting new therapist and working on med adjustments with her PCP

## 2024-09-23 ENCOUNTER — Other Ambulatory Visit (HOSPITAL_COMMUNITY): Payer: Self-pay

## 2024-09-23 NOTE — Telephone Encounter (Signed)
 Pharmacy Patient Advocate Encounter  Received notification from [facility] MEDICAID that Prior Authorization for Auvelity 45-105MG  er tablets has been APPROVED from 09/13/24 to further notice.  Full approval letter has been uploaded to media tab   PA #/Case ID/Reference #: [id number]

## 2024-09-24 DIAGNOSIS — M791 Myalgia, unspecified site: Secondary | ICD-10-CM | POA: Diagnosis not present

## 2024-09-24 DIAGNOSIS — G43109 Migraine with aura, not intractable, without status migrainosus: Secondary | ICD-10-CM | POA: Diagnosis not present

## 2024-09-24 DIAGNOSIS — G518 Other disorders of facial nerve: Secondary | ICD-10-CM | POA: Diagnosis not present

## 2024-09-24 DIAGNOSIS — M542 Cervicalgia: Secondary | ICD-10-CM | POA: Diagnosis not present

## 2024-10-04 ENCOUNTER — Encounter: Payer: Self-pay | Admitting: Family Medicine

## 2024-10-09 ENCOUNTER — Encounter: Attending: Physical Medicine and Rehabilitation | Admitting: Physical Medicine and Rehabilitation

## 2024-10-09 ENCOUNTER — Encounter: Payer: Self-pay | Admitting: Physical Medicine and Rehabilitation

## 2024-10-09 VITALS — BP 114/82 | HR 98 | Ht 63.0 in | Wt 187.0 lb

## 2024-10-09 DIAGNOSIS — M7918 Myalgia, other site: Secondary | ICD-10-CM | POA: Diagnosis not present

## 2024-10-09 DIAGNOSIS — M47816 Spondylosis without myelopathy or radiculopathy, lumbar region: Secondary | ICD-10-CM | POA: Diagnosis not present

## 2024-10-09 DIAGNOSIS — M797 Fibromyalgia: Secondary | ICD-10-CM | POA: Diagnosis not present

## 2024-10-09 MED ORDER — TRIAMCINOLONE ACETONIDE 40 MG/ML IJ SUSP
5.0000 mg | Freq: Once | INTRAMUSCULAR | Status: AC
  Administered 2024-11-06: 5.2 mg via INTRAMUSCULAR

## 2024-10-09 MED ORDER — LIDOCAINE HCL 1 % IJ SOLN
6.0000 mL | Freq: Once | INTRAMUSCULAR | Status: AC
  Administered 2024-11-06: 6 mL

## 2024-10-09 NOTE — Progress Notes (Signed)
 HPI:   Yesenia Wood is a 38 y.o. female with PMHx has Deaf; B12 deficiency; Cochlear implant status; Fatty infiltration of liver; Gastro-esophageal reflux disease without esophagitis; Generalized headache; Right-sided Bell's palsy; Hypertriglyceridemia; Irregular menstrual cycle; Overweight; Paresthesia; Disorder of labyrinth; Palpitations; Sleep disturbance; Chronic migraine without aura; Dissocial personality disorder (HCC); OSA (obstructive sleep apnea); Insomnia due to mental condition; Muteness; MDD (major depressive disorder), recurrent episode, moderate (HCC); Social anxiety disorder; Allergic rhinitis; Bartholin gland cyst; Migraine; Obesity (BMI 30-39.9); RLS (restless legs syndrome); Right sided numbness; Sleep difficulties; Positive ANA (antinuclear antibody); Fibromyalgia; Dyslipidemia; Vitamin D  deficiency; Nausea; Chronic pain syndrome; Sprain of left ankle; Myofascial pain; and Right sided weakness on their problem list. who presents to clinic for treatment of pain related to trigger point  via injection as described below.    She says she could be better. Flexeril  she is using 2-3x a day after walking in the morning. It does not make her drowsy. She is tolerating it better than last time. She says she is about 25% better than she was last time. Still getting benefit from trigger points; does not take away all her pain but does make a large difference. Just started Auvelity for depression and has not noticed a difference with it yet. Her energy remains low.      Physical Exam:  General: Appropriate appearance for age.  Mental Status: Appropriate Wood and affect.  Cardiovascular: RRR, no m/r/g.  Respiratory: CTAB, no rales/rhonchi/wheezing.  Skin: No apparent rashes or lesions.  Neuro: Awake, alert, and oriented x3. No apparent deficits.  MSK: Moving all 4 limbs antigravity and against resistance.  + TTP bilateral lower cervical paraspinals, bilateral rhomboids, bilateral lower lumbar  paraspinals, bilateral gluteus maximus   PROCEDURE: Trigger point injections Diagnosis: M79.18  Goals with treatment: [x ] Decrease pain [x]  Improve Active / Passive ROM [  ] Improve ADLs [ x ] Improve functional mobility   MEDICATION:  Kenalog  40 mg/mL - 0.2 ccs Lidocaine  1% - 6 ccs     CONSENT: Obtained in writing followed by time-out per clinic policy. Consent uploaded to chart.   Benefits discussed.  Risks discussed included, but were not limited to, pain and discomfort, bleeding, bruising, allergic reaction, infection. All questions answered to patient/family member/guardian/ caregiver satisfaction. They would like to proceed with procedure. There are no noted contraindications to procedure.   PROCEDURE Time out was preformed No heat sources No antibiotics   The patient was explained about both the benefits and risks of a trigger point injection. After the patient acknowledged an understanding of the risks and benefits, the patient agreed to proceed. The area was first marked and then prepped in an aseptic fashion with betadine  / alcohol. A 30 g, 1/2 inch needle was directed via a posterior approach into the bilateral lower cervical paraspinals, bilateral rhomboids, bilateral lower lumbar paraspinals; bilateral gluteus injections were performed with 27 g, 1.5 in needle. The injection was completed with Kenalog  0.2 cc mixed with 6cc of 1% lidocaine  after no blood was aspirated on pull back.   The pt tolerated the procedure well. The patient reported immediate relief of the pain and improved pain free range of motion. No complications were encountered.    Impression: HPI: Yesenia Wood is a 37 y.o. female with PMHx has Deaf; B12 deficiency; Cochlear implant status; Fatty infiltration of liver; Gastro-esophageal reflux disease without esophagitis; Generalized headache; Right-sided Bell's palsy; Hypertriglyceridemia; Irregular menstrual cycle; Overweight; Paresthesia; Disorder of  labyrinth; Palpitations; Sleep disturbance; Chronic  migraine without aura; Dissocial personality disorder (HCC); OSA (obstructive sleep apnea); Insomnia due to mental condition; Muteness; MDD (major depressive disorder), recurrent episode, moderate (HCC); Social anxiety disorder; Allergic rhinitis; Bartholin gland cyst; Migraine; Obesity (BMI 30-39.9); RLS (restless legs syndrome); Right sided numbness; Sleep difficulties; Positive ANA (antinuclear antibody); Fibromyalgia; Dyslipidemia; Vitamin D  deficiency; Nausea; Chronic pain syndrome; Sprain of left ankle; Myofascial pain; and Right sided weakness on their problem list. who presents to clinic for treatment of myofascial./FM pain . They received a  Bilateral  trigger point injections as above.    PLAN: - Resume Usual Activities. Notify Physician of any unusual bleeding, erythema or concern for side effects as reviewed above. - Apply ice prn for pain - Tylenol  prn for pain - Follow up Q1M for TPI - OOP cost for Journavix not feasible; has failed all other available medication FM treatments  - Continue flexeril  5 mg TID PRN - Given increasing LBP and facet arthritis on MRI last year, will Schedule with Dr Carilyn for L4/5 facet blocks/ablation   Patient/Care Marko was ready to learn without apparent learning barriers. Education was provided on diagnosis, treatment options/plan according to patient's preferred learning style. Patient/Care Giver verbalized understanding and agreement with the above plan.

## 2024-10-15 ENCOUNTER — Other Ambulatory Visit: Payer: Self-pay | Admitting: Physical Medicine and Rehabilitation

## 2024-10-17 ENCOUNTER — Other Ambulatory Visit: Payer: Self-pay | Admitting: Family Medicine

## 2024-10-17 DIAGNOSIS — E785 Hyperlipidemia, unspecified: Secondary | ICD-10-CM

## 2024-10-18 ENCOUNTER — Telehealth: Payer: Self-pay | Admitting: Physical Medicine and Rehabilitation

## 2024-10-18 NOTE — Telephone Encounter (Signed)
 Requested by interface surescripts. Future visit 12/16/24. Requested Prescriptions  Pending Prescriptions Disp Refills   rosuvastatin  (CRESTOR ) 20 MG tablet [Pharmacy Med Name: ROSUVASTATIN  CALCIUM  20 MG TAB] 90 tablet 0    Sig: TAKE 1 TABLET BY MOUTH EVERY DAY     Cardiovascular:  Antilipid - Statins 2 Failed - 10/18/2024  4:35 PM      Failed - Cr in normal range and within 360 days    Creat  Date Value Ref Range Status  07/04/2023 0.80 0.50 - 0.97 mg/dL Final   Creatinine, Ser  Date Value Ref Range Status  01/08/2024 1.08 (H) 0.57 - 1.00 mg/dL Final         Failed - Lipid Panel in normal range within the last 12 months    Cholesterol, Total  Date Value Ref Range Status  10/29/2015 192 100 - 199 mg/dL Final   Cholesterol  Date Value Ref Range Status  03/14/2024 110 <200 mg/dL Final   LDL Cholesterol (Calc)  Date Value Ref Range Status  03/14/2024 56 mg/dL (calc) Final    Comment:    Reference range: <100 . Desirable range <100 mg/dL for primary prevention;   <70 mg/dL for patients with CHD or diabetic patients  with > or = 2 CHD risk factors. SABRA LDL-C is now calculated using the Martin-Hopkins  calculation, which is a validated novel method providing  better accuracy than the Friedewald equation in the  estimation of LDL-C.  Gladis APPLETHWAITE et al. SANDREA. 7986;689(80): 2061-2068  (http://education.[employer].com/faq/FAQ164)    HDL  Date Value Ref Range Status  03/14/2024 37 (L) > OR = 50 mg/dL Final  87/98/7983 43 >60 mg/dL Final   Triglycerides  Date Value Ref Range Status  03/14/2024 89 <150 mg/dL Final         Passed - Patient is not pregnant      Passed - Valid encounter within last 12 months    Recent Outpatient Visits           1 month ago MDD (major depressive disorder), recurrent episode, moderate (HCC)   [facility] Spaulding, Dorette, MD   7 months ago Benign skin lesion of thigh   [facility]  Glenard Dorette, MD   8 months ago Fibromyalgia   [facility] Sowles, Krichna, MD

## 2024-10-18 NOTE — Telephone Encounter (Signed)
 P needs refill of cyclobenzaprine  (FLEXERIL ) 5 MG tablet

## 2024-10-28 ENCOUNTER — Encounter: Payer: Self-pay | Admitting: Family Medicine

## 2024-10-31 DIAGNOSIS — H5000 Unspecified esotropia: Secondary | ICD-10-CM | POA: Diagnosis not present

## 2024-10-31 DIAGNOSIS — H52223 Regular astigmatism, bilateral: Secondary | ICD-10-CM | POA: Diagnosis not present

## 2024-10-31 DIAGNOSIS — H5203 Hypermetropia, bilateral: Secondary | ICD-10-CM | POA: Diagnosis not present

## 2024-10-31 DIAGNOSIS — M3501 Sicca syndrome with keratoconjunctivitis: Secondary | ICD-10-CM | POA: Diagnosis not present

## 2024-11-03 NOTE — Progress Notes (Deleted)
 Yesenia Console, PA-C [address] [place], [place]  [address] Phone: [phone]   Primary Care Physician: Sowles, Krichna, MD  Primary Gastroenterologist:  Yesenia Console, PA-C / ***  Chief Complaint: Follow-up IBS, constipation, and GERD   HPI:   Discussed the use of AI scribe software for clinical note transcription with the patient, who gave verbal consent to proceed.  I last saw patient in 10/2023 at [facility] for follow-up of IBS.  She has history of IBS, constipation, and GERD.  We are using sign language interpreter from, language services.  Current medications: Pantoprazole  40 mg once daily in the morning for GERD.  Linzess  145 mcg daily for chronic constipation.  Needs medication refill.  History of Present Illness    GI HISTORY: -Lab 07/04/2023 showed normal CBC, CMP, and lipase.  ALT was slightly elevated at 35.  Hx chronically elevated ALT due to fatty liver.  -Complete abdominal ultrasound 07/04/2023 showed hepatic steatosis, otherwise normal.  No gallstones. -Abdominal x-ray 06/06/2023 showed prominent gas in the colon, otherwise normal.  Small amount of stool throughout the colon.  No bowel obstruction.   -She had pelvic ultrasound through her GYN. -Saw GI 10 years ago in [place]  and had a normal colonoscopy.   Current Outpatient Medications  Medication Sig Dispense Refill   BOTOX 100 units SOLR injection Inject 100 Units into the muscle every 3 (three) months.     carvedilol  (COREG ) 6.25 MG tablet TAKE 1 TABLET BY MOUTH TWICE A DAY 180 tablet 3   Cholecalciferol (VITAMIN D ) 50 MCG (2000 UT) CAPS Take 1 capsule by mouth daily.     Cyanocobalamin  (VITAMIN B-12) 500 MCG SUBL Place 1 tablet (500 mcg total) under the tongue once a week. 30 tablet 0   cyclobenzaprine  (FLEXERIL ) 5 MG tablet TAKE 1 TABLET BY MOUTH THREE TIMES A DAY AS NEEDED FOR MUSCLE SPASMS 90 tablet 0   Dextromethorphan-buPROPion  ER (AUVELITY ) 45-105 MG TBCR Take 1 tablet by mouth daily. 90  tablet 0   EMGALITY 120 MG/ML SOAJ      etonogestrel  (NEXPLANON ) 68 MG IMPL implant 68 mg by Subdermal route once. Implantation 01/31/24     fluticasone  (FLONASE) 50 MCG/ACT nasal spray Place 1 spray into both nostrils daily.     folic acid  (FOLVITE ) 400 MCG tablet Take 800 mcg by mouth daily.     levocetirizine (XYZAL) 5 MG tablet Take 5 mg by mouth every evening.     linaclotide  (LINZESS ) 290 MCG CAPS capsule Take 1 capsule (290 mcg total) by mouth daily before breakfast. 30 capsule 5   naproxen  (NAPROSYN ) 500 MG tablet TAKE 1 TABLET (500 MG TOTAL) BY MOUTH DAILY AS NEEDED (TAKE WITH BENADRYLL). 90 tablet 3   pantoprazole  (PROTONIX ) 40 MG tablet Take 1 tablet (40 mg total) by mouth daily. No refills - needs f/up with PCP or GI 90 tablet 3   rosuvastatin  (CRESTOR ) 20 MG tablet TAKE 1 TABLET BY MOUTH EVERY DAY 90 tablet 0   Current Facility-Administered Medications  Medication Dose Route Frequency Provider Last Rate Last Admin   lidocaine  (XYLOCAINE ) 1 % (with pres) injection 5 mL  5 mL Other Once        lidocaine  (XYLOCAINE ) 1 % (with pres) injection 6 mL  6 mL Other Once        triamcinolone  acetonide (KENALOG -40) injection 5.2 mg  5.2 mg Intramuscular Once        triamcinolone  acetonide (KENALOG -40) injection 5.2 mg  5.2 mg  Intramuscular Once         Allergies as of 11/04/2024 - Review Complete 09/12/2024  Allergen Reaction Noted   Budesonide-formoterol fumarate Anaphylaxis and Other (See Comments) 08/19/2013   Influenza virus vaccine Hives and Other (See Comments) 08/19/2022   Astelin  [azelastine ]  10/12/2017   Gabapentin Nausea And Vomiting 11/12/2014   Nortriptyline Other (See Comments) 06/23/2015   Lamictal  [lamotrigine ]  09/15/2020   Milnacipran Other (See Comments) 04/24/2014   Montelukast Other (See Comments) 03/14/2024   Tegaderm ag mesh [silver ] Itching 09/25/2019   Diclofenac  Other (See Comments) 08/19/2013   Duloxetine hcl Other (See Comments) 08/19/2013   Montelukast  sodium Other (See Comments) 08/19/2013   Mupirocin Other (See Comments) 10/06/2014   Other Rash 02/06/2007   Oxycodone  hcl Rash 04/04/2018   Sulfa antibiotics Hives, Rash, and Itching 02/06/2007   Tegaderm alginate ag rope Rash 03/14/2024    Past Medical History:  Diagnosis Date   ADHD (attention deficit hyperactivity disorder)    B12 deficiency    Bell's palsy    right sided   Chest pain    a. 05/2015 ETT: elev HR/BP, but no acute ST/T changes; b. 09/2019 Cor CTA: Nl cors, Ca2+ = 0.   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   Fatigue    Fatty liver    Fibromyalgia    GERD (gastroesophageal reflux disease)    Headache    High triglycerides    Hyperglycemia    Hypertension    Hyperthyroidism    Moderate asthma    Obesity    Paresthesia    Premature birth     Past Surgical History:  Procedure Laterality Date   BARTHOLIN GLAND CYST REMOVAL     COCHLEAR IMPLANT     COCHLEAR IMPLANT REMOVAL     DILATATION AND CURETTAGE/HYSTEROSCOPY WITH MINERVA N/A 03/05/2021   Procedure: DILATATION AND CURETTAGE/HYSTEROSCOPY WITH MINERVA;  Surgeon: Connell Davies, MD;  Location: [facility] ORS;  Service: Gynecology;  Laterality: N/A;   LAPAROSCOPIC TUBAL LIGATION Bilateral 03/26/2018   Procedure: LAPAROSCOPIC TUBAL LIGATION;  Surgeon: Connell Davies, MD;  Location: [facility] OR[facility];  Service: Gynecology;  Laterality: Bilateral;  with peritoneal biopsies   LAPAROSCOPY     MYRINGOTOMY WITH TUBE PLACEMENT     TUBAL LIGATION  03/26/2018    Review of Systems:    All systems reviewed and negative except where noted in HPI.    Physical Exam:  There were no vitals taken for this visit. No LMP recorded. Patient has had an implant.  General: Well-nourished, well-developed in no acute distress.  Lungs: Clear to auscultation bilaterally. Non-labored. Heart: Regular rate and rhythm, no murmurs rubs or gallops.  Abdomen:  Bowel sounds are normal; Abdomen is Soft; No hepatosplenomegaly, masses or hernias;  No Abdominal Tenderness; No guarding or rebound tenderness. Neuro: Alert and oriented x 3.  Grossly intact.  Psych: Alert and cooperative, normal mood and affect.   Imaging Studies: No results found.  Labs: CBC    Component Value Date/Time   WBC 10.4 01/08/2024 1455   WBC 7.2 07/04/2023 1217   RBC 5.40 (H) 01/08/2024 1455   RBC 4.82 07/04/2023 1217   HGB 15.5 01/08/2024 1455   HCT 48.8 (H) 01/08/2024 1455   PLT 414 01/08/2024 1455   MCV 90 01/08/2024 1455   MCV 86 08/13/2014 2040   MCH 28.7 01/08/2024 1455   MCH 28.2 07/04/2023 1217  MCHC 31.8 01/08/2024 1455   MCHC 32.5 07/04/2023 1217   RDW 13.2 01/08/2024 1455   RDW 12.5 08/13/2014 2040   LYMPHSABS 2,707 07/04/2023 1217   LYMPHSABS 4.7 (H) 06/23/2015 0932   MONOABS 0.6 07/17/2021 1301   EOSABS 29 07/04/2023 1217   EOSABS 0.1 06/23/2015 0932   BASOSABS 43 07/04/2023 1217   BASOSABS 0.0 06/23/2015 0932    CMP     Component Value Date/Time   NA 141 01/08/2024 1455   NA 137 08/13/2014 2040   K 4.8 01/08/2024 1455   K 3.8 08/13/2014 2040   CL 103 01/08/2024 1455   CL 104 08/13/2014 2040   CO2 22 01/08/2024 1455   CO2 23 08/13/2014 2040   GLUCOSE 85 01/08/2024 1455   GLUCOSE 83 07/04/2023 1217   GLUCOSE 74 08/13/2014 2040   BUN 8 01/08/2024 1455   BUN 10 08/13/2014 2040   CREATININE 1.08 (H) 01/08/2024 1455   CREATININE 0.80 07/04/2023 1217   CALCIUM  9.6 01/08/2024 1455   CALCIUM  9.3 08/13/2014 2040   PROT 6.9 09/26/2023 1121   ALBUMIN 4.5 09/26/2023 1121   AST 21 09/26/2023 1121   ALT 19 09/26/2023 1121   ALKPHOS 60 09/26/2023 1121   BILITOT 0.3 09/26/2023 1121   GFRNONAA >60 07/18/2021 1817   GFRNONAA 91 10/09/2020 1114   GFRAA 105 10/09/2020 1114       Assessment and Plan:   Yesenia Wood is a 38 y.o. y/o female ***  Assessment and Plan Assessment & Plan       Yesenia Console, PA-C  Follow up ***

## 2024-11-04 ENCOUNTER — Ambulatory Visit: Admitting: Physician Assistant

## 2024-11-06 ENCOUNTER — Encounter: Attending: Physical Medicine and Rehabilitation | Admitting: Physical Medicine and Rehabilitation

## 2024-11-06 ENCOUNTER — Encounter: Payer: Self-pay | Admitting: Physical Medicine and Rehabilitation

## 2024-11-06 ENCOUNTER — Telehealth: Payer: Self-pay

## 2024-11-06 VITALS — BP 120/84 | HR 89 | Ht 62.0 in | Wt 189.0 lb

## 2024-11-06 DIAGNOSIS — R200 Anesthesia of skin: Secondary | ICD-10-CM | POA: Insufficient documentation

## 2024-11-06 DIAGNOSIS — M797 Fibromyalgia: Secondary | ICD-10-CM | POA: Insufficient documentation

## 2024-11-06 DIAGNOSIS — M7918 Myalgia, other site: Secondary | ICD-10-CM

## 2024-11-06 DIAGNOSIS — R29898 Other symptoms and signs involving the musculoskeletal system: Secondary | ICD-10-CM | POA: Insufficient documentation

## 2024-11-06 DIAGNOSIS — M47816 Spondylosis without myelopathy or radiculopathy, lumbar region: Secondary | ICD-10-CM | POA: Diagnosis present

## 2024-11-06 MED ORDER — LIDOCAINE HCL 1 % IJ SOLN
6.0000 mL | Freq: Once | INTRAMUSCULAR | Status: AC
  Administered 2024-11-06: 6 mL

## 2024-11-06 MED ORDER — TRIAMCINOLONE ACETONIDE 40 MG/ML IJ SUSP
5.0000 mg | Freq: Once | INTRAMUSCULAR | Status: AC
  Administered 2024-11-06: 5.2 mg via INTRAMUSCULAR

## 2024-11-06 NOTE — Progress Notes (Signed)
 HPI:   MALORIE BIGFORD is a 38 y.o. female with PMHx has Deaf; B12 deficiency; Cochlear implant status; Fatty infiltration of liver; Gastro-esophageal reflux disease without esophagitis; Generalized headache; Right-sided Bell's palsy; Hypertriglyceridemia; Irregular menstrual cycle; Overweight; Paresthesia; Disorder of labyrinth; Palpitations; Sleep disturbance; Chronic migraine without aura; Dissocial personality disorder (HCC); OSA (obstructive sleep apnea); Insomnia due to mental condition; Muteness; MDD (major depressive disorder), recurrent episode, moderate (HCC); Social anxiety disorder; Allergic rhinitis; Bartholin gland cyst; Migraine; Obesity (BMI 30-39.9); RLS (restless legs syndrome); Right sided numbness; Sleep difficulties; Positive ANA (antinuclear antibody); Fibromyalgia; Dyslipidemia; Vitamin D  deficiency; Nausea; Chronic pain syndrome; Sprain of left ankle; Myofascial pain; and Right sided weakness on their problem list. who presents to clinic for treatment of pain related to trigger point  via injection as described below.    She says she could be better.  Still tolerating flexeril  and occassional naproxen .  Asking about OT for dropping things with her right hand, has had in the past with benefit. She had EMG RUE when she was young, does not want repeated. Is a side sleeper on her wrist.    Physical Exam:  General: Appropriate appearance for age.  Mental Status: Appropriate mood and affect.  Cardiovascular: RRR, no m/r/g.  Respiratory: CTAB, no rales/rhonchi/wheezing.  Skin: No apparent rashes or lesions.  Neuro: Awake, alert, and oriented x3. No apparent deficits.  MSK: Moving all 4 limbs antigravity and against resistance.  + TTP bilateral lower cervical paraspinals, bilateral trapezius, bilateral levator scapulae, bilateral rhomboids, bilateral lower lumbar paraspinals  + Tinel's right wrist   PROCEDURE: Trigger point injections Diagnosis: M79.18  Goals with treatment: [x  ] Decrease pain [x]  Improve Active / Passive ROM [  ] Improve ADLs [ x ] Improve functional mobility   MEDICATION:  Kenalog  40 mg/mL - 0.2 ccs Lidocaine  1% - 6 ccs     CONSENT: Obtained in writing followed by time-out per clinic policy. Consent uploaded to chart.   Benefits discussed.  Risks discussed included, but were not limited to, pain and discomfort, bleeding, bruising, allergic reaction, infection. All questions answered to patient/family member/guardian/ caregiver satisfaction. They would like to proceed with procedure. There are no noted contraindications to procedure.   PROCEDURE Time out was preformed No heat sources No antibiotics   The patient was explained about both the benefits and risks of a trigger point injection. After the patient acknowledged an understanding of the risks and benefits, the patient agreed to proceed. The area was first marked and then prepped in an aseptic fashion with betadine  / alcohol. A 30 g, 1/2 inch needle was directed via a posterior approach into the bilateral lower cervical paraspinals, bilateral trapezius, bilateral levator scapulae, bilateral rhomboids; bilateral lower lumbar paraspinals injections were performed with 27 g, 1.5 in needle. The injection was completed with Kenalog  0.2 cc mixed with 6cc of 1% lidocaine  after no blood was aspirated on pull back.   The pt tolerated the procedure well. The patient reported immediate relief of the pain and improved pain free range of motion. No complications were encountered.    Impression: HPI: MARGALIT LEECE is a 38 y.o. female with PMHx has Deaf; B12 deficiency; Cochlear implant status; Fatty infiltration of liver; Gastro-esophageal reflux disease without esophagitis; Generalized headache; Right-sided Bell's palsy; Hypertriglyceridemia; Irregular menstrual cycle; Overweight; Paresthesia; Disorder of labyrinth; Palpitations; Sleep disturbance; Chronic migraine without aura; Dissocial personality  disorder (HCC); OSA (obstructive sleep apnea); Insomnia due to mental condition; Muteness; MDD (major depressive disorder), recurrent episode,  moderate (HCC); Social anxiety disorder; Allergic rhinitis; Bartholin gland cyst; Migraine; Obesity (BMI 30-39.9); RLS (restless legs syndrome); Right sided numbness; Sleep difficulties; Positive ANA (antinuclear antibody); Fibromyalgia; Dyslipidemia; Vitamin D  deficiency; Nausea; Chronic pain syndrome; Sprain of left ankle; Myofascial pain; and Right sided weakness on their problem list. who presents to clinic for treatment of myofascial./FM pain . They received a  Bilateral  trigger point injections as above.    PLAN: - Resume Usual Activities. Notify Physician of any unusual bleeding, erythema or concern for side effects as reviewed above. - Apply ice prn for pain - Tylenol  prn for pain - Follow up Q1M PRN for TPI - OOP cost for Journavix not feasible; has failed all other available medication FM treatments  - Continue flexeril  5 mg TID PRN - Given increasing LBP and facet arthritis on MRI last year, will Schedule with Dr Carilyn for L4/5 facet blocks/ablation - Script to OT for RUE weakness and numbness; suspect CTS, recommend neutral wrist brace over the counter to use at nighttime. Offered EMG but deferred at this time   Patient/Care Marko was ready to learn without apparent learning barriers. Education was provided on diagnosis, treatment options/plan according to patient's preferred learning style. Patient/Care Giver verbalized understanding and agreement with the above plan.

## 2024-11-06 NOTE — Patient Instructions (Signed)
-   Resume Usual Activities. Notify Physician of any unusual bleeding, erythema or concern for side effects as reviewed above. - Apply ice prn for pain - Tylenol  prn for pain - Follow up Q1M PRN for TPI - OOP cost for Journavix not feasible; has failed all other available medication FM treatments  - Continue flexeril  5 mg TID PRN - Given increasing LBP and facet arthritis on MRI last year, will Schedule with Dr Carilyn for L4/5 facet blocks/ablation - Script to OT for RUE weakness and numbness; suspect CTS, recommend neutral wrist brace over the counter to use at nighttime. Offered EMG but deferred at this time

## 2024-11-06 NOTE — Telephone Encounter (Signed)
 Reviewed and signed Pre Procedure for MBB scheduled on 12/27/2024 with Dr. Carilyn.  All questions from patient answered.

## 2024-11-12 ENCOUNTER — Other Ambulatory Visit: Payer: Self-pay | Admitting: Physical Medicine and Rehabilitation

## 2024-11-27 ENCOUNTER — Ambulatory Visit

## 2024-11-27 DIAGNOSIS — R278 Other lack of coordination: Secondary | ICD-10-CM | POA: Diagnosis present

## 2024-11-27 DIAGNOSIS — R200 Anesthesia of skin: Secondary | ICD-10-CM | POA: Diagnosis present

## 2024-11-27 DIAGNOSIS — R29898 Other symptoms and signs involving the musculoskeletal system: Secondary | ICD-10-CM | POA: Diagnosis present

## 2024-11-27 NOTE — Therapy (Signed)
 " OUTPATIENT OCCUPATIONAL THERAPY ORTHO EVALUATION  Patient Name: Yesenia Wood MRN: [id number] DOB:[birth year], 38 y.o., female Today's Date: 11/27/2024  PCP: Dr. Dorette Loron REFERRING PROVIDER: Dr. Joesph Likes (pain management)   END OF SESSION:  OT End of Session - 11/27/24 2048     Visit Number 1    Number of Visits 24    Date for Recertification  02/19/25    Progress Note Due on Visit 10    OT Start Time 1145    OT Stop Time 1235    OT Time Calculation (min) 50 min    Activity Tolerance Patient tolerated treatment well    Behavior During Therapy [facility] for tasks assessed/performed         Past Medical History:  Diagnosis Date   ADHD (attention deficit hyperactivity disorder)    B12 deficiency    Bell's palsy    right sided   Chest pain    a. 05/2015 ETT: elev HR/BP, but no acute ST/T changes; b. 09/2019 Cor CTA: Nl cors, Ca2+ = 0.   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   Fatigue    Fatty liver    Fibromyalgia    GERD (gastroesophageal reflux disease)    Headache    High triglycerides    Hyperglycemia    Hypertension    Hyperthyroidism    Moderate asthma    Obesity    Paresthesia    Premature birth    Past Surgical History:  Procedure Laterality Date   BARTHOLIN GLAND CYST REMOVAL     COCHLEAR IMPLANT     COCHLEAR IMPLANT REMOVAL     DILATATION AND CURETTAGE/HYSTEROSCOPY WITH MINERVA N/A 03/05/2021   Procedure: DILATATION AND CURETTAGE/HYSTEROSCOPY WITH MINERVA;  Surgeon: Connell Davies, MD;  Location: [facility] ORS;  Service: Gynecology;  Laterality: N/A;   LAPAROSCOPIC TUBAL LIGATION Bilateral 03/26/2018   Procedure: LAPAROSCOPIC TUBAL LIGATION;  Surgeon: Connell Davies, MD;  Location: [facility] ORS;  Service: Gynecology;  Laterality: Bilateral;  with peritoneal biopsies   LAPAROSCOPY     MYRINGOTOMY WITH TUBE PLACEMENT     TUBAL LIGATION  03/26/2018   Patient Active  Problem List   Diagnosis Date Noted   Right hand weakness 11/06/2024   Facet arthritis of lumbar region 10/09/2024   Chronic allergic conjunctivitis 09/12/2024   Myofascial pain 10/04/2023   Chronic pain syndrome 03/03/2023   Dyslipidemia 02/10/2022   Vitamin D  deficiency 02/10/2022   Positive ANA (antinuclear antibody) 12/20/2021   Fibromyalgia 12/20/2021   Right sided numbness 12/26/2019   RLS (restless legs syndrome) 09/30/2019   MDD (major depressive disorder), recurrent episode, moderate (HCC) 07/17/2019   Social anxiety disorder 07/17/2019   Muteness 09/10/2018   OSA (obstructive sleep apnea) 10/10/2016   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   Dissocial personality disorder (HCC) 06/07/2016   Sleep disturbance 05/10/2016   Chronic migraine without aura 03/29/2016   Palpitations 06/09/2015   Deaf 05/16/2015   B12 deficiency 05/16/2015   Cochlear implant status 05/16/2015   Fatty infiltration of liver 05/16/2015   Gastro-esophageal reflux disease without esophagitis 05/16/2015   Generalized headache 05/16/2015   Right-sided Bell's palsy 05/16/2015   Irregular menstrual cycle 05/16/2015   Paresthesia 05/16/2015   Disorder of labyrinth 05/16/2015   Migraine 08/05/2013   Allergic rhinitis 07/01/2013   Obesity (BMI 30-39.9) 07/01/2013   Bartholin gland cyst 09/16/2008   ONSET DATE: ~2-3  years for numbness in the hand   REFERRING DIAG:  R29.898 (ICD-10-CM) - Right hand weakness  R20.0 (ICD-10-CM) - Right sided numbness   THERAPY DIAG:  Other lack of coordination  Weakness of right hand  Right sided numbness  Rationale for Evaluation and Treatment: Rehabilitation  SUBJECTIVE:   SUBJECTIVE STATEMENT: Pt reports likely being able to attend OT 1x a week or less d/t uncertainty of how her fibromyalgia will be on a given day.   Pt accompanied by: self and interpreter: Charlie armin Rise, ASL interpreters   PERTINENT HISTORY: Per visit with Dr. Emeline on  11/06/24 and EMR: Recent trigger point injections as follows: bilateral lower cervical paraspinals, bilateral trapezius, bilateral levator scapulae, bilateral rhomboids; bilateral lower lumbar paraspinals injections   Pt reports weakness and numbness in her R hand for the last 2 or 3 years.  Reports some numbness in her R leg.   Per order from Dr. Emeline: OT to evaluate and treat. RUE numbness/weakness, CNS workup negative, Hx FM, suspect R carpal tunnel. Work on print production planner, coordination. Per EMR, neutral wrist brace recommended for use at nighttime   Asking about OT for dropping things with her right hand, has had in the past with benefit. She had EMG RUE when she was young, does not want repeated. Is a side sleeper on her wrist.   PRECAUTIONS: Fall  WEIGHT BEARING RESTRICTIONS: No  PAIN:  Are you having pain? Yes: NPRS scale: 4/10 at rest, can increase with activity up to 8/10 Pain location: R hand  Pain description: pins/needles, achy  Aggravating factors: overuse/activity  Relieving factors: rest  FALLS: Has patient fallen in last 6 months? Yes. Number of falls  pt estimates 1-3 per week; pt declines PT at this time.  Pt reports she's trying to resolve the pain in her back and has a nerve block scheduled in January.   LIVING ENVIRONMENT: Lives with: lives with their family with parents  Lives in: Other pt lives in basement; ramp access  Stairs: Yes: Internal: 1 flight steps; can reach both Has following equipment at home: Quad cane small base  PLOF: Independent with basic ADLs; ambulates with cane d/t R hip and low back pain.  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  Able to drive. Enjoys crafting, reading, and using her tablet.   PATIENT GOALS: I really want to stop dropping things.  I have a problem brushing my teeth, and preparing my food from scratch. Pt reports that she wants to learn how to live better.  NEXT MD  VISIT: 12/09/24 with Dr. Emeline  OBJECTIVE:  Note: Objective measures were completed at Evaluation unless otherwise noted.  HAND DOMINANCE: Right  ADLs:  MAM-20 for musculoskeletal conditions: 11/27/24 (eval): 46/80 1= cannot do, 2=Very hard to do, 3=A little hard to do, 4=Easy to do Rating Choose only one number (definitions above)  2 Wring a towel  2 2. Open a medicine bottle with a child proof cap/top  1 3. Cut nails with a nail clipper   3 4. Open a wide-mouth bottle previously opened  2 5. Cut meat on a plate  1 6. Tie shoes with laces  2 7. Button clothes (medium sized buttons)  3 8. Pick up a 1/2 full water pitcher  2 9. Zip jacket  3 10. Write 3-4 lines legibly   3 11. Turn key (to open a door)  3 12. Take things/cards out of a  wallet   3 13. Squeeze toothpaste onto a toothbrush  2 14. Handle/count money (bills and coins)  2 15. Brush or comb hair  2 16. Wash hands  2 17. Use a spoon or fork  2 18. Brush teeth   3 19. Dial or key in telephone numbers  3 20. Use hand(s) to eat a sandwich   46/80    FUNCTIONAL OUTCOME MEASURES: MAM-20 for musculoskeletal conditions: 46/80  UPPER EXTREMITY ROM:     Active ROM Right eval Left eval  Shoulder flexion    Shoulder abduction    Shoulder adduction    Shoulder extension    Shoulder internal rotation    Shoulder external rotation    Middle trapezius    Lower trapezius    Elbow flexion    Elbow extension    Wrist flexion 80 (90)  90  Wrist extension 40 (53) 63  Wrist ulnar deviation    Wrist radial deviation    Wrist pronation WNL WNL  Wrist supination WNL WNL  (Blank rows = not tested)   Active ROM Right eval Left eval  Thumb MCP (0-60)    Thumb IP (0-80)    Thumb Radial abd/add (0-55)     Thumb Palmar abd/add (0-45)     Thumb Opposition to Small Finger     Index MCP (0-90)     Index PIP (0-100)     Index DIP (0-70)      Long MCP (0-90)      Long PIP (0-100)      Long DIP (0-70)      Ring MCP  (0-90)      Ring PIP (0-100)      Ring DIP (0-70)      Little MCP (0-90)      Little PIP (0-100)      Little DIP (0-70)      (Blank rows = not tested)  UPPER EXTREMITY MMT:  will complete in follow up session  MMT Right eval Left eval  Shoulder flexion    Shoulder abduction    Shoulder adduction    Shoulder extension    Shoulder internal rotation    Shoulder external rotation    Middle trapezius    Lower trapezius    Elbow flexion    Elbow extension    Wrist flexion    Wrist extension    Wrist ulnar deviation    Wrist radial deviation    Wrist pronation    Wrist supination    (Blank rows = not tested)  HAND FUNCTION: Grip strength: Right: 10 lbs; Left: 54 lbs, Lateral pinch: Right: 2 lbs, Left: 14 lbs, and 3 point pinch: Right: 1 lbs, Left: 14 lbs  COORDINATION: 9 Hole Peg test: Right: 34 sec; Left: 27 sec  SENSATION: Pt reports numbness in the R hand, all digits, extending to the elbow.   EDEMA: No visible edema  COGNITION: Overall cognitive status: Within functional limits for tasks assessed  OBSERVATIONS:  ASL interpreter present.  Clear mask preferred as pt can read lips a little.   +Tinels R hand in prayer position and reverse prayer, both <30 sec  TREATMENT DATE: 11/27/24:  Evaluation completed.  Self Care: -Review of OT goals, poc  -Encouraged pt continue with use of pt's grip strengthener at home (pt describes a triangular foam piece), with OT recommending use 2x daily for gripping and pinching.  Will issue theraputty next session for additional strengthening exercises.  PATIENT EDUCATION: Education details: OT goals, poc Person educated: Patient Education method: Explanation Education comprehension: verbalized understanding  HOME EXERCISE PROGRAM: Grip/pinch strengthening with current foam at home; will add to HEP next session with issue  of theraputty  GOALS: Goals reviewed with patient? Yes  SHORT TERM GOALS: Target date: 01/08/25  Pt will be indep to perform HEP for improving R wrist/hand flexibility, strength, and coordination for daily tasks. Baseline: Eval: Will issue theraputty next session Goal status: INITIAL  2.  Pt will be indep to verbalize 2-3 cumulative trauma prevention strategies to reduce pain/paresthesias in the RUE.   Baseline: Eval: Education not yet initiated Goal status: INITIAL  LONG TERM GOALS: Target date: 02/19/25  Pt will increase MAM-20 score for musculoskeletal conditions by 10 or more points to indicate improvement in self perceived functional use of the  R hand for daily tasks.   Baseline: Eval: 46/80 Goal status: INITIAL  2.  Pt will increase R grip strength by 15 lbs or more in order to improve ability to hold and carry ADL supplies with reduced dropping.    Baseline: Eval: R grip 10 lbs (L 54 lbs) Goal status: INITIAL  3.  Pt will increase R lateral pinch strength by 5 or more lbs to improve grasp of toothbrush and ease ability to squeeze toothpaste from tube.  Baseline: Eval: R 2 lbs (L 14 lbs) Goal status: INITIAL  4.  Pt will increase R 3 point pinch strength by 5 or more lbs to improve manipulation of clothing fasteners. Baseline: Eval: R 1 lb (L 14 lbs); very hard to fasten medium sized buttons and zip a jacket Goal status: INITIAL  5.  Pt will tolerate manual therapy, therapeutic modalities, and exercises to decrease pain in R wrist/hand to a reported 2/10 pain or less with activity.  Baseline: Eval: R hand pain can reach up to 8/10 with activity (4/10 pain at rest during eval) Goal status: INITIAL  ASSESSMENT:  CLINICAL IMPRESSION: Patient is a 38 y.o. female who was seen today for occupational therapy evaluation for functional decline r/t R hand numbness and weakness.  Referring MD suspecting CTS.  Pt presents with significant R hand weakness, stiffness in the R wrist,  moderate-severe pain/paresthesias in the RUE.  Pt reports no current carpal tunnel brace.  Will plan to issue next session for night time wear.  Pt's goals are to improve strength and use of her hand, improve her pain, and limit dropping things when using her R hand.  Pt will benefit from skilled OT to address above noted deficits, working towards goals listed above to maximize indep with daily tasks and improve QOL.     PERFORMANCE DEFICITS: in functional skills including ADLs, IADLs, coordination, dexterity, sensation, ROM, strength, pain, fascial restrictions, muscle spasms, flexibility, Fine motor control, hearing, mobility, balance, body mechanics, decreased knowledge of precautions, decreased knowledge of use of DME, and UE functional use, and psychosocial skills including coping strategies, environmental adaptation, habits, and routines and behaviors.   IMPAIRMENTS: are limiting patient from ADLs, IADLs, rest and sleep, and leisure.   COMORBIDITIES: has co-morbidities such as fibromyalgia, chronic migraines, depression, dissocial personality disorder, obesity, HTN, R sided Bell's Palsy, paresthesia that affects occupational performance. Patient will benefit from skilled OT to address above impairments and improve overall function.  MODIFICATION OR ASSISTANCE TO COMPLETE EVALUATION: Min-Moderate modification of tasks or assist with assess necessary to complete an evaluation.  OT OCCUPATIONAL PROFILE AND HISTORY: Detailed assessment: Review of records and additional review  of physical, cognitive, psychosocial history related to current functional performance.  CLINICAL DECISION MAKING: Moderate - several treatment options, min-mod task modification necessary  REHAB POTENTIAL: Good for established goals, dependent upon regular attendance of therapy sessions (1-2x per week recommended; pt thinks she may be able to attend 1x a week to every other week, being uncertain d/t her pain on a given day with  her fibromyalgia)  EVALUATION COMPLEXITY: Moderate      PLAN:  OT FREQUENCY: 1-2x/week  OT DURATION: 12 weeks  PLANNED INTERVENTIONS: 97168 OT Re-evaluation, 97535 self care/ADL training, 02889 therapeutic exercise, 97530 therapeutic activity, 97112 neuromuscular re-education, 97140 manual therapy, 97035 ultrasound, 97010 moist heat, 97010 cryotherapy, 97034 contrast bath, 97033 iontophoresis, 97750 Physical Performance Testing, 02239 Orthotic Initial, 97763 Orthotic/Prosthetic subsequent, passive range of motion, psychosocial skills training, coping strategies training, patient/family education, and DME and/or AE instructions  RECOMMENDED OTHER SERVICES: PT recommended for frequent falls; pt declines PT at this time  CONSULTED AND AGREED WITH PLAN OF CARE: Patient  PLAN FOR NEXT SESSION: Issue carpal tunnel brace, finish MMT, issue theraputty  Inocente Blazing, MS, OTR/L   Inocente MARLA Blazing, OT 11/27/2024, 8:56 PM   "

## 2024-12-02 ENCOUNTER — Ambulatory Visit

## 2024-12-02 ENCOUNTER — Ambulatory Visit: Attending: Physical Medicine and Rehabilitation

## 2024-12-02 DIAGNOSIS — R278 Other lack of coordination: Secondary | ICD-10-CM | POA: Insufficient documentation

## 2024-12-02 DIAGNOSIS — R200 Anesthesia of skin: Secondary | ICD-10-CM | POA: Insufficient documentation

## 2024-12-02 DIAGNOSIS — R29898 Other symptoms and signs involving the musculoskeletal system: Secondary | ICD-10-CM | POA: Insufficient documentation

## 2024-12-02 NOTE — Therapy (Signed)
 " OUTPATIENT OCCUPATIONAL THERAPY ORTHO TREATMENT NOTE  Patient Name: Yesenia Wood MRN: [id number] DOB:[birth year], 39 y.o., female Today's Date: 12/02/2024  PCP: Dr. Dorette Loron REFERRING PROVIDER: Dr. Joesph Likes (pain management)   END OF SESSION:  OT End of Session - 12/02/24 1622     Visit Number 2    Number of Visits 24    Date for Recertification  02/19/25    Progress Note Due on Visit 10    OT Start Time 1145    OT Stop Time 1230    OT Time Calculation (min) 45 min    Activity Tolerance Patient tolerated treatment well    Behavior During Therapy [facility] for tasks assessed/performed         Past Medical History:  Diagnosis Date   ADHD (attention deficit hyperactivity disorder)    B12 deficiency    Bell's palsy    right sided   Chest pain    a. 05/2015 ETT: elev HR/BP, but no acute ST/T changes; b. 09/2019 Cor CTA: Nl cors, Ca2+ = 0.   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   Fatigue    Fatty liver    Fibromyalgia    GERD (gastroesophageal reflux disease)    Headache    High triglycerides    Hyperglycemia    Hypertension    Hyperthyroidism    Moderate asthma    Obesity    Paresthesia    Premature birth    Past Surgical History:  Procedure Laterality Date   BARTHOLIN GLAND CYST REMOVAL     COCHLEAR IMPLANT     COCHLEAR IMPLANT REMOVAL     DILATATION AND CURETTAGE/HYSTEROSCOPY WITH MINERVA N/A 03/05/2021   Procedure: DILATATION AND CURETTAGE/HYSTEROSCOPY WITH MINERVA;  Surgeon: Connell Davies, MD;  Location: [facility] ORS;  Service: Gynecology;  Laterality: N/A;   LAPAROSCOPIC TUBAL LIGATION Bilateral 03/26/2018   Procedure: LAPAROSCOPIC TUBAL LIGATION;  Surgeon: Connell Davies, MD;  Location: [facility] ORS;  Service: Gynecology;  Laterality: Bilateral;  with peritoneal biopsies   LAPAROSCOPY     MYRINGOTOMY WITH TUBE PLACEMENT     TUBAL LIGATION  03/26/2018   Patient Active  Problem List   Diagnosis Date Noted   Right hand weakness 11/06/2024   Facet arthritis of lumbar region 10/09/2024   Chronic allergic conjunctivitis 09/12/2024   Myofascial pain 10/04/2023   Chronic pain syndrome 03/03/2023   Dyslipidemia 02/10/2022   Vitamin D  deficiency 02/10/2022   Positive ANA (antinuclear antibody) 12/20/2021   Fibromyalgia 12/20/2021   Right sided numbness 12/26/2019   RLS (restless legs syndrome) 09/30/2019   MDD (major depressive disorder), recurrent episode, moderate (HCC) 07/17/2019   Social anxiety disorder 07/17/2019   Muteness 09/10/2018   OSA (obstructive sleep apnea) 10/10/2016   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   Dissocial personality disorder (HCC) 06/07/2016   Sleep disturbance 05/10/2016   Chronic migraine without aura 03/29/2016   Palpitations 06/09/2015   Deaf 05/16/2015   B12 deficiency 05/16/2015   Cochlear implant status 05/16/2015   Fatty infiltration of liver 05/16/2015   Gastro-esophageal reflux disease without esophagitis 05/16/2015   Generalized headache 05/16/2015   Right-sided Bell's palsy 05/16/2015   Irregular menstrual cycle 05/16/2015   Paresthesia 05/16/2015   Disorder of labyrinth 05/16/2015   Migraine 08/05/2013   Allergic rhinitis 07/01/2013   Obesity (BMI 30-39.9) 07/01/2013   Bartholin gland cyst 09/16/2008   ONSET DATE: ~  2-3 years for numbness in the hand   REFERRING DIAG:  R29.898 (ICD-10-CM) - Right hand weakness  R20.0 (ICD-10-CM) - Right sided numbness   THERAPY DIAG:  Other lack of coordination  Right sided numbness  Weakness of right hand  Rationale for Evaluation and Treatment: Rehabilitation  SUBJECTIVE:   SUBJECTIVE STATEMENT: Pt reports she'd like to stay and try her OT session using the app on her phone for interpretive services (Live Transcribe and Sound) d/t in-person ASL interpreter not available today.  OT offered option to cancel session, or to utilize writing or text for  communication, or to utilize video monitor for ASL interpretation, but pt requested to use her phone app and was content to stay.   Pt accompanied by: self   PERTINENT HISTORY: Per visit with Dr. Emeline on 11/06/24 and EMR: Recent trigger point injections as follows: bilateral lower cervical paraspinals, bilateral trapezius, bilateral levator scapulae, bilateral rhomboids; bilateral lower lumbar paraspinals injections   Pt reports weakness and numbness in her R hand for the last 2 or 3 years.  Reports some numbness in her R leg.   Per order from Dr. Emeline: OT to evaluate and treat. RUE numbness/weakness, CNS workup negative, Hx FM, suspect R carpal tunnel. Work on print production planner, coordination. Per EMR, neutral wrist brace recommended for use at nighttime   Asking about OT for dropping things with her right hand, has had in the past with benefit. She had EMG RUE when she was young, does not want repeated. Is a side sleeper on her wrist.   PRECAUTIONS: Fall  WEIGHT BEARING RESTRICTIONS: No  PAIN: 12/02/24: R hand 2/10 pain start of session, 5/10 pain end of session  Are you having pain? Yes: NPRS scale: 4/10 at rest, can increase with activity up to 8/10 Pain location: R hand  Pain description: pins/needles, achy  Aggravating factors: overuse/activity  Relieving factors: rest  FALLS: Has patient fallen in last 6 months? Yes. Number of falls  pt estimates 1-3 per week; pt declines PT at this time.  Pt reports she's trying to resolve the pain in her back and has a nerve block scheduled in January.   LIVING ENVIRONMENT: Lives with: lives with their family with parents  Lives in: Other pt lives in basement; ramp access  Stairs: Yes: Internal: 1 flight steps; can reach both Has following equipment at home: Quad cane small base  PLOF: Independent with basic ADLs; ambulates with cane d/t R hip and low back pain.  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  Able to drive. Enjoys crafting, reading, and using her tablet.   PATIENT GOALS: I really want to stop dropping things.  I have a problem brushing my teeth, and preparing my food from scratch. Pt reports that she wants to learn how to live better.  NEXT MD VISIT: 12/09/24 with Dr. Emeline  OBJECTIVE:  Note: Objective measures were completed at Evaluation unless otherwise noted.  HAND DOMINANCE: Right  ADLs:  MAM-20 for musculoskeletal conditions: 11/27/24 (eval): 46/80 1= cannot do, 2=Very hard to do, 3=A little hard to do, 4=Easy to do Rating Choose only one number (definitions above)  2 Wring a towel  2 2. Open a medicine bottle with a child proof cap/top  1 3. Cut nails with a nail clipper   3 4. Open a wide-mouth bottle previously opened  2 5. Cut meat on a plate  1 6. Tie shoes with  laces  2 7. Button clothes (medium sized buttons)  3 8. Pick up a 1/2 full water pitcher  2 9. Zip jacket  3 10. Write 3-4 lines legibly   3 11. Turn key (to open a door)  3 12. Take things/cards out of a wallet   3 13. Squeeze toothpaste onto a toothbrush  2 14. Handle/count money (bills and coins)  2 15. Brush or comb hair  2 16. Wash hands  2 17. Use a spoon or fork  2 18. Brush teeth   3 19. Dial or key in telephone numbers  3 20. Use hand(s) to eat a sandwich   46/80    FUNCTIONAL OUTCOME MEASURES: MAM-20 for musculoskeletal conditions: 46/80  UPPER EXTREMITY ROM:     Active ROM Right eval Left eval  Shoulder flexion    Shoulder abduction    Shoulder adduction    Shoulder extension    Shoulder internal rotation    Shoulder external rotation    Middle trapezius    Lower trapezius    Elbow flexion    Elbow extension    Wrist flexion 80 (90)  90  Wrist extension 40 (53) 63  Wrist ulnar deviation    Wrist radial deviation    Wrist pronation WNL WNL  Wrist supination WNL WNL  (Blank rows = not tested)   Active ROM Right eval Left eval  Thumb MCP (0-60)     Thumb IP (0-80)    Thumb Radial abd/add (0-55)     Thumb Palmar abd/add (0-45)     Thumb Opposition to Small Finger     Index MCP (0-90)     Index PIP (0-100)     Index DIP (0-70)      Long MCP (0-90)      Long PIP (0-100)      Long DIP (0-70)      Ring MCP (0-90)      Ring PIP (0-100)      Ring DIP (0-70)      Little MCP (0-90)      Little PIP (0-100)      Little DIP (0-70)      (Blank rows = not tested)  UPPER EXTREMITY MMT:    MMT Right 12/02/24 Left 12/02/24  Shoulder flexion    Shoulder abduction    Shoulder adduction    Shoulder extension    Shoulder internal rotation    Shoulder external rotation    Middle trapezius    Lower trapezius    Elbow flexion 5 5  Elbow extension 5 5  Wrist flexion 5 5  Wrist extension 5 5  Wrist ulnar deviation 5 5  Wrist radial deviation 5 5  Wrist pronation 4+ 4+  Wrist supination 4+ 4+  (Blank rows = not tested)  HAND FUNCTION: Grip strength: Right: 10 lbs; Left: 54 lbs, Lateral pinch: Right: 2 lbs, Left: 14 lbs, and 3 point pinch: Right: 1 lbs, Left: 14 lbs  COORDINATION: 9 Hole Peg test: Right: 34 sec; Left: 27 sec  SENSATION: Pt reports numbness in the R hand, all digits, extending to the elbow.   EDEMA: No visible edema  COGNITION: Overall cognitive status: Within functional limits for tasks assessed  OBSERVATIONS:  ASL interpreter present.  Clear mask preferred as pt can read lips a little.   +Tinels R hand in prayer position and reverse prayer, both <30 sec  TREATMENT DATE: 12/02/24:  Therapeutic Exercise: -R hand strengthening: Issued yellow theraputty and instructed pt  in gross grasping, digit opposition, lateral, 2 point, 3 point pinch, digit abd/ext, thumb flex, and digging coins out of putty; min vc to follow written handout to promote optimal form/technique. Encouraged completion 1-2x daily to tolerance.  -Instructed in/completed RUE median nerve glides, proximal and distal; mod vc for  form/technique -Performed wrist/digit ext stretch on wall with extended elbow to reduce stiffness in wrist/hand; initial demo for form/technique.  Self Care: -Condition management education: Issued handouts and reviewed specifics of carpal tunnel syndrome, recommended neutral wrist positioning to prevent worsening of symptoms, benefits of carpal tunnel brace and recommended wearing times.   -Tried medium carpal tunnel brace, but pt likely needs a small.  Small not currently in stock.  Pt advised she could wait for clinic to order brace or may obtain otc at a local drug store.  OT advised on options to obtain and pt plans to purchase her own.  Recommended initially wearing at night time, but pt reports that she does not wake up with worsening symptoms.  Encouraged pt wear brace during the day with activities that contribute to pain/paresthesias in the R hand.  Pt verbalized understanding.                                                                                             PATIENT EDUCATION: Education details: HEP  Person educated: Patient Education method: Programmer, Multimedia, Facilities Manager, Verbal cues, and Handouts Education comprehension: verbalized understanding, returned demonstration, verbal cues required, and needs further education  HOME EXERCISE PROGRAM: Grip/pinch strengthening with current foam at home; will add to HEP next session with issue of theraputty Yellow theraputty Median nerve glides  GOALS: Goals reviewed with patient? Yes  SHORT TERM GOALS: Target date: 01/08/25  Pt will be indep to perform HEP for improving R wrist/hand flexibility, strength, and coordination for daily tasks. Baseline: Eval: Will issue theraputty next session Goal status: INITIAL  2.  Pt will be indep to verbalize 2-3 cumulative trauma prevention strategies to reduce pain/paresthesias in the RUE.   Baseline: Eval: Education not yet initiated Goal status: INITIAL  LONG TERM GOALS: Target date:  02/19/25  Pt will increase MAM-20 score for musculoskeletal conditions by 10 or more points to indicate improvement in self perceived functional use of the  R hand for daily tasks.   Baseline: Eval: 46/80 Goal status: INITIAL  2.  Pt will increase R grip strength by 15 lbs or more in order to improve ability to hold and carry ADL supplies with reduced dropping.    Baseline: Eval: R grip 10 lbs (L 54 lbs) Goal status: INITIAL  3.  Pt will increase R lateral pinch strength by 5 or more lbs to improve grasp of toothbrush and ease ability to squeeze toothpaste from tube.  Baseline: Eval: R 2 lbs (L 14 lbs) Goal status: INITIAL  4.  Pt will increase R 3 point pinch strength by 5 or more lbs to improve manipulation of clothing fasteners. Baseline: Eval: R 1 lb (L 14 lbs); very hard to fasten medium sized buttons and zip a jacket Goal status: INITIAL  5.  Pt will tolerate manual therapy,  therapeutic modalities, and exercises to decrease pain in R wrist/hand to a reported 2/10 pain or less with activity.  Baseline: Eval: R hand pain can reach up to 8/10 with activity (4/10 pain at rest during eval) Goal status: INITIAL  ASSESSMENT:  CLINICAL IMPRESSION: Pt tolerated yellow theraputty exercises and median nerve glides fairly well today, noting 3 point increase in pain by end of session.  Pt continues to present with significant R hand weakness and stiffness with R wrist and digit ext.  Pt struggled to extend wrist and digits with extended elbow today when attempting full arm median nerve glide, and demonstrates limited wrist and digit extension with a distal median nerve glide.  Pt encouraged to focus on self passive R wrist and digit ext using edge of table, L hand, or wall for passive stretching, working towards greater end range for above noted movements.  Pt encouraged to work with putty to her tolerance.  Pt will continue to benefit from skilled OT to address above noted deficits, working  towards goals listed above to maximize indep with daily tasks and improve QOL.     PERFORMANCE DEFICITS: in functional skills including ADLs, IADLs, coordination, dexterity, sensation, ROM, strength, pain, fascial restrictions, muscle spasms, flexibility, Fine motor control, hearing, mobility, balance, body mechanics, decreased knowledge of precautions, decreased knowledge of use of DME, and UE functional use, and psychosocial skills including coping strategies, environmental adaptation, habits, and routines and behaviors.   IMPAIRMENTS: are limiting patient from ADLs, IADLs, rest and sleep, and leisure.   COMORBIDITIES: has co-morbidities such as fibromyalgia, chronic migraines, depression, dissocial personality disorder, obesity, HTN, R sided Bell's Palsy, paresthesia that affects occupational performance. Patient will benefit from skilled OT to address above impairments and improve overall function.  MODIFICATION OR ASSISTANCE TO COMPLETE EVALUATION: Min-Moderate modification of tasks or assist with assess necessary to complete an evaluation.  OT OCCUPATIONAL PROFILE AND HISTORY: Detailed assessment: Review of records and additional review of physical, cognitive, psychosocial history related to current functional performance.  CLINICAL DECISION MAKING: Moderate - several treatment options, min-mod task modification necessary  REHAB POTENTIAL: Good for established goals, dependent upon regular attendance of therapy sessions (1-2x per week recommended; pt thinks she may be able to attend 1x a week to every other week, being uncertain d/t her pain on a given day with her fibromyalgia)  EVALUATION COMPLEXITY: Moderate      PLAN:  OT FREQUENCY: 1-2x/week  OT DURATION: 12 weeks  PLANNED INTERVENTIONS: 97168 OT Re-evaluation, 97535 self care/ADL training, 02889 therapeutic exercise, 97530 therapeutic activity, 97112 neuromuscular re-education, 97140 manual therapy, 97035 ultrasound, 97010  moist heat, 97010 cryotherapy, 97034 contrast bath, 97033 iontophoresis, 97750 Physical Performance Testing, 02239 Orthotic Initial, 97763 Orthotic/Prosthetic subsequent, passive range of motion, psychosocial skills training, coping strategies training, patient/family education, and DME and/or AE instructions  RECOMMENDED OTHER SERVICES: PT recommended for frequent falls; pt declines PT at this time  CONSULTED AND AGREED WITH PLAN OF CARE: Patient  PLAN FOR NEXT SESSION: see above  Inocente Blazing, MS, OTR/L  Inocente MARLA Blazing, OT 12/02/2024, 4:24 PM   "

## 2024-12-04 ENCOUNTER — Ambulatory Visit: Admitting: Occupational Therapy

## 2024-12-09 ENCOUNTER — Encounter: Attending: Physical Medicine and Rehabilitation | Admitting: Physical Medicine and Rehabilitation

## 2024-12-09 ENCOUNTER — Encounter: Payer: Self-pay | Admitting: Physical Medicine and Rehabilitation

## 2024-12-09 VITALS — BP 113/82 | HR 95 | Ht 62.0 in | Wt 193.2 lb

## 2024-12-09 DIAGNOSIS — M797 Fibromyalgia: Secondary | ICD-10-CM | POA: Diagnosis not present

## 2024-12-09 DIAGNOSIS — M7918 Myalgia, other site: Secondary | ICD-10-CM | POA: Insufficient documentation

## 2024-12-09 MED ORDER — TRIAMCINOLONE ACETONIDE 40 MG/ML IJ SUSP: 2.0000 mg | Freq: Once | INTRAMUSCULAR | Status: AC

## 2024-12-09 MED ORDER — LIDOCAINE HCL 1 % IJ SOLN: 6.0000 mL | Freq: Once | INTRAMUSCULAR | Status: AC

## 2024-12-09 NOTE — Progress Notes (Signed)
 HPI:   Yesenia Wood is a 39 y.o. female with PMHx has Deaf; B12 deficiency; Cochlear implant status; Fatty infiltration of liver; Gastro-esophageal reflux disease without esophagitis; Generalized headache; Right-sided Bell's palsy; Hypertriglyceridemia; Irregular menstrual cycle; Overweight; Paresthesia; Disorder of labyrinth; Palpitations; Sleep disturbance; Chronic migraine without aura; Dissocial personality disorder (HCC); OSA (obstructive sleep apnea); Insomnia due to mental condition; Muteness; MDD (major depressive disorder), recurrent episode, moderate (HCC); Social anxiety disorder; Allergic rhinitis; Bartholin gland cyst; Migraine; Obesity (BMI 30-39.9); RLS (restless legs syndrome); Right sided numbness; Sleep difficulties; Positive ANA (antinuclear antibody); Fibromyalgia; Dyslipidemia; Vitamin D  deficiency; Nausea; Chronic pain syndrome; Sprain of left ankle; Myofascial pain; and Right sided weakness on their problem list. who presents to clinic for treatment of pain related to trigger point  via injection as described below.    Ongoing issues with low back pain, has MBB coming up with Dr Carilyn in February. Discussed that this block is temporary and should not effect her appointment with me 3 days later; we can discuss results at that time. No changes in pain, continues to benefit from TPIs.    Physical Exam:  General: Appropriate appearance for age.  Mental Status: Appropriate mood and affect.  Cardiovascular: RRR, no m/r/g.  Respiratory: CTAB, no rales/rhonchi/wheezing.  Skin: No apparent rashes or lesions.  Neuro: Awake, alert, and oriented x3. No apparent deficits.  MSK: Moving all 4 limbs antigravity and against resistance.  + TTP bilateral lower cervical paraspinals, bilateral trapezius,  bilateral lower lumbar paraspinals    PROCEDURE: Trigger point injections Diagnosis: M79.18  Goals with treatment: [x ] Decrease pain [x]  Improve Active / Passive ROM [  ] Improve  ADLs [ x ] Improve functional mobility   MEDICATION:  Kenalog  40 mg/mL - 0.2 ccs Lidocaine  1% - 6 ccs     CONSENT: Obtained in writing followed by time-out per clinic policy. Consent uploaded to chart.   Benefits discussed.  Risks discussed included, but were not limited to, pain and discomfort, bleeding, bruising, allergic reaction, infection. All questions answered to patient/family member/guardian/ caregiver satisfaction. They would like to proceed with procedure. There are no noted contraindications to procedure.   PROCEDURE Time out was preformed No heat sources No antibiotics   The patient was explained about both the benefits and risks of a trigger point injection. After the patient acknowledged an understanding of the risks and benefits, the patient agreed to proceed. The area was first marked and then prepped in an aseptic fashion with betadine  / alcohol. A 30 g, 1/2 inch needle was directed via a posterior approach into the bilateral lower cervical paraspinals, bilateral trapezius, ;  bilateral lower lumbar paraspinals injections were performed with 27 g, 1.5 in needle. The injection was completed with Kenalog  0.2 cc mixed with 6cc of 1% lidocaine  after no blood was aspirated on pull back.   The pt tolerated the procedure well. The patient reported immediate relief of the pain and improved pain free range of motion. No complications were encountered.    Impression: HPI: Yesenia Wood is a 39 y.o. female with PMHx has Deaf; B12 deficiency; Cochlear implant status; Fatty infiltration of liver; Gastro-esophageal reflux disease without esophagitis; Generalized headache; Right-sided Bell's palsy; Hypertriglyceridemia; Irregular menstrual cycle; Overweight; Paresthesia; Disorder of labyrinth; Palpitations; Sleep disturbance; Chronic migraine without aura; Dissocial personality disorder (HCC); OSA (obstructive sleep apnea); Insomnia due to mental condition; Muteness; MDD (major depressive  disorder), recurrent episode, moderate (HCC); Social anxiety disorder; Allergic rhinitis; Bartholin gland cyst; Migraine; Obesity (BMI  30-39.9); RLS (restless legs syndrome); Right sided numbness; Sleep difficulties; Positive ANA (antinuclear antibody); Fibromyalgia; Dyslipidemia; Vitamin D  deficiency; Nausea; Chronic pain syndrome; Sprain of left ankle; Myofascial pain; and Right sided weakness on their problem list. who presents to clinic for treatment of myofascial./FM pain . They received a  Bilateral  trigger point injections as above.    PLAN: - Resume Usual Activities. Notify Physician of any unusual bleeding, erythema or concern for side effects as reviewed above. - Apply ice prn for pain - Tylenol  prn for pain - Follow up Q1M PRN for TPI - OOP cost for Journavix not feasible; has failed all other available medication FM treatments  - Continue flexeril  5 mg TID PRN - Has scheduled with Dr Carilyn for L4/5 facet blocks/ablation on February - Continue neutral wrist brace over the counter to use at nighttime. EMG deferred last visit.    Patient/Care Marko was ready to learn without apparent learning barriers. Education was provided on diagnosis, treatment options/plan according to patient's preferred learning style. Patient/Care Giver verbalized understanding and agreement with the above plan

## 2024-12-09 NOTE — Patient Instructions (Signed)
-   Resume Usual Activities. Notify Physician of any unusual bleeding, erythema or concern for side effects as reviewed above. - Apply ice prn for pain - Tylenol  prn for pain - Follow up Q1M PRN for TPI - OOP cost for Journavix not feasible; has failed all other available medication FM treatments  - Continue flexeril  5 mg TID PRN - Has scheduled with Dr Carilyn for L4/5 facet blocks/ablation on February - Continue neutral wrist brace over the counter to use at nighttime. EMG deferred last visit.

## 2024-12-10 ENCOUNTER — Other Ambulatory Visit: Payer: Self-pay | Admitting: Physical Medicine and Rehabilitation

## 2024-12-10 ENCOUNTER — Ambulatory Visit

## 2024-12-11 ENCOUNTER — Ambulatory Visit

## 2024-12-11 DIAGNOSIS — R278 Other lack of coordination: Secondary | ICD-10-CM | POA: Diagnosis not present

## 2024-12-11 DIAGNOSIS — R29898 Other symptoms and signs involving the musculoskeletal system: Secondary | ICD-10-CM

## 2024-12-11 DIAGNOSIS — R200 Anesthesia of skin: Secondary | ICD-10-CM

## 2024-12-11 NOTE — Therapy (Signed)
 " OUTPATIENT OCCUPATIONAL THERAPY ORTHO TREATMENT NOTE  Patient Name: Yesenia Wood MRN: [id number] DOB:Dec 18, 1985, 39 y.o., female Today's Date: 12/14/2024  PCP: Dr. Dorette Loron REFERRING PROVIDER: Dr. Joesph Likes (pain management)   END OF SESSION:  OT End of Session - 12/11/24 1145a       Visit Number 3    Number of Visits 24     Date for Recertification  02/19/25     Progress Note Due on Visit 10     OT Start Time 1101a    OT Stop Time 1145a    OT Time Calculation (min) 44 min     Activity Tolerance Patient tolerated treatment well     Behavior During Therapy [facility] for tasks assessed/performed     Past Medical History:  Diagnosis Date   ADHD (attention deficit hyperactivity disorder)    B12 deficiency    Bell's palsy    right sided   Chest pain    a. 05/2015 ETT: elev HR/BP, but no acute ST/T changes; b. 09/2019 Cor CTA: Nl cors, Ca2+ = 0.   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   Fatigue    Fatty liver    Fibromyalgia    GERD (gastroesophageal reflux disease)    Headache    High triglycerides    Hyperglycemia    Hypertension    Hyperthyroidism    Moderate asthma    Obesity    Paresthesia    Premature birth    Past Surgical History:  Procedure Laterality Date   BARTHOLIN GLAND CYST REMOVAL     COCHLEAR IMPLANT     COCHLEAR IMPLANT REMOVAL     DILATATION AND CURETTAGE/HYSTEROSCOPY WITH MINERVA N/A 03/05/2021   Procedure: DILATATION AND CURETTAGE/HYSTEROSCOPY WITH MINERVA;  Surgeon: Connell Davies, MD;  Location: [facility];  Service: Gynecology;  Laterality: N/A;   LAPAROSCOPIC TUBAL LIGATION Bilateral 03/26/2018   Procedure: LAPAROSCOPIC TUBAL LIGATION;  Surgeon: Connell Davies, MD;  Location: [facility];  Service: Gynecology;  Laterality: Bilateral;  with peritoneal biopsies   LAPAROSCOPY     MYRINGOTOMY WITH TUBE PLACEMENT     TUBAL LIGATION  03/26/2018   Patient  Active Problem List   Diagnosis Date Noted   Right hand weakness 11/06/2024   Facet arthritis of lumbar region 10/09/2024   Chronic allergic conjunctivitis 09/12/2024   Myofascial pain 10/04/2023   Chronic pain syndrome 03/03/2023   Dyslipidemia 02/10/2022   Vitamin D  deficiency 02/10/2022   Positive ANA (antinuclear antibody) 12/20/2021   Fibromyalgia 12/20/2021   Right sided numbness 12/26/2019   RLS (restless legs syndrome) 09/30/2019   MDD (major depressive disorder), recurrent episode, moderate (HCC) 07/17/2019   Social anxiety disorder 07/17/2019   Muteness 09/10/2018   OSA (obstructive sleep apnea) 10/10/2016   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   Dissocial personality disorder (HCC) 06/07/2016   Sleep disturbance 05/10/2016   Chronic migraine without aura 03/29/2016   Palpitations 06/09/2015   Deaf 05/16/2015   B12 deficiency 05/16/2015   Cochlear implant status 05/16/2015   Fatty infiltration of liver 05/16/2015   Gastro-esophageal reflux disease without esophagitis 05/16/2015   Generalized headache 05/16/2015   Right-sided Bell's palsy 05/16/2015   Irregular menstrual cycle 05/16/2015   Paresthesia 05/16/2015   Disorder of labyrinth 05/16/2015   Migraine 08/05/2013   Allergic rhinitis 07/01/2013   Obesity (BMI 30-39.9) 07/01/2013   Bartholin gland cyst 09/16/2008  ONSET DATE: ~2-3 years for numbness in the hand   REFERRING DIAG:  R29.898 (ICD-10-CM) - Right hand weakness  R20.0 (ICD-10-CM) - Right sided numbness   THERAPY DIAG:  Other lack of coordination  Right sided numbness  Weakness of right hand  Rationale for Evaluation and Treatment: Rehabilitation  SUBJECTIVE:   SUBJECTIVE STATEMENT: Pt reports having some pain/discomfort after working on her HEP, but did try exercises most days.  Pt accompanied by: self   PERTINENT HISTORY: Per visit with Dr. Emeline on 11/06/24 and EMR: Recent trigger point injections as follows: bilateral lower  cervical paraspinals, bilateral trapezius, bilateral levator scapulae, bilateral rhomboids; bilateral lower lumbar paraspinals injections   Pt reports weakness and numbness in her R hand for the last 2 or 3 years.  Reports some numbness in her R leg.   Per order from Dr. Emeline: OT to evaluate and treat. RUE numbness/weakness, CNS workup negative, Hx FM, suspect R carpal tunnel. Work on print production planner, coordination. Per EMR, neutral wrist brace recommended for use at nighttime   Asking about OT for dropping things with her right hand, has had in the past with benefit. She had EMG RUE when she was young, does not want repeated. Is a side sleeper on her wrist.   PRECAUTIONS: Fall  WEIGHT BEARING RESTRICTIONS: No  PAIN: 12/11/24: moderate pain RUE Are you having pain? Yes: NPRS scale: 4/10 at rest, can increase with activity up to 8/10 Pain location: R hand  Pain description: pins/needles, achy  Aggravating factors: overuse/activity  Relieving factors: rest  FALLS: Has patient fallen in last 6 months? Yes. Number of falls  pt estimates 1-3 per week; pt declines PT at this time.  Pt reports she's trying to resolve the pain in her back and has a nerve block scheduled in January.   LIVING ENVIRONMENT: Lives with: lives with their family with parents  Lives in: Other pt lives in basement; ramp access  Stairs: Yes: Internal: 1 flight steps; can reach both Has following equipment at home: Quad cane small base  PLOF: Independent with basic ADLs; ambulates with cane d/t R hip and low back pain.  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  Able to drive. Enjoys crafting, reading, and using her tablet.   PATIENT GOALS: I really want to stop dropping things.  I have a problem brushing my teeth, and preparing my food from scratch. Pt reports that she wants to learn how to live better.  NEXT MD VISIT: 12/09/24 with Dr. Emeline  OBJECTIVE:  Note:  Objective measures were completed at Evaluation unless otherwise noted.  HAND DOMINANCE: Right  ADLs:  MAM-20 for musculoskeletal conditions: 11/27/24 (eval): 46/80 1= cannot do, 2=Very hard to do, 3=A little hard to do, 4=Easy to do Rating Choose only one number (definitions above)  2 Wring a towel  2 2. Open a medicine bottle with a child proof cap/top  1 3. Cut nails with a nail clipper   3 4. Open a wide-mouth bottle previously opened  2 5. Cut meat on a plate  1 6. Tie shoes with laces  2 7. Button clothes (medium sized buttons)  3 8. Pick up a 1/2 full water pitcher  2 9. Zip jacket  3 10. Write 3-4 lines legibly   3 11. Turn key (to open a door)  3 12. Take things/cards out of a wallet   3 13. Squeeze toothpaste onto a toothbrush  2 14. Handle/count money (bills and coins)  2 15. Brush or comb hair  2 16. Wash hands  2 17. Use a spoon or fork  2 18. Brush teeth   3 19. Dial or key in telephone numbers  3 20. Use hand(s) to eat a sandwich   46/80    FUNCTIONAL OUTCOME MEASURES: MAM-20 for musculoskeletal conditions: 46/80  UPPER EXTREMITY ROM:     Active ROM Right eval Left eval  Shoulder flexion    Shoulder abduction    Shoulder adduction    Shoulder extension    Shoulder internal rotation    Shoulder external rotation    Middle trapezius    Lower trapezius    Elbow flexion    Elbow extension    Wrist flexion 80 (90)  90  Wrist extension 40 (53) 63  Wrist ulnar deviation    Wrist radial deviation    Wrist pronation WNL WNL  Wrist supination WNL WNL  (Blank rows = not tested)   Active ROM Right eval Left eval  Thumb MCP (0-60)    Thumb IP (0-80)    Thumb Radial abd/add (0-55)     Thumb Palmar abd/add (0-45)     Thumb Opposition to Small Finger     Index MCP (0-90)     Index PIP (0-100)     Index DIP (0-70)      Long MCP (0-90)      Long PIP (0-100)      Long DIP (0-70)      Ring MCP (0-90)      Ring PIP (0-100)      Ring DIP (0-70)       Little MCP (0-90)      Little PIP (0-100)      Little DIP (0-70)      (Blank rows = not tested)  UPPER EXTREMITY MMT:    MMT Right 12/02/24 Left 12/02/24  Shoulder flexion    Shoulder abduction    Shoulder adduction    Shoulder extension    Shoulder internal rotation    Shoulder external rotation    Middle trapezius    Lower trapezius    Elbow flexion 5 5  Elbow extension 5 5  Wrist flexion 5 5  Wrist extension 5 5  Wrist ulnar deviation 5 5  Wrist radial deviation 5 5  Wrist pronation 4+ 4+  Wrist supination 4+ 4+  (Blank rows = not tested)  HAND FUNCTION: Grip strength: Right: 10 lbs; Left: 54 lbs, Lateral pinch: Right: 2 lbs, Left: 14 lbs, and 3 point pinch: Right: 1 lbs, Left: 14 lbs  COORDINATION: 9 Hole Peg test: Right: 34 sec; Left: 27 sec  SENSATION: Pt reports numbness in the R hand, all digits, extending to the elbow.   EDEMA: No visible edema  COGNITION: Overall cognitive status: Within functional limits for tasks assessed  OBSERVATIONS:  ASL interpreter present.  Clear mask preferred as pt can read lips a little.   +Tinels R hand in prayer position and reverse prayer, both <30 sec  TREATMENT DATE: 12/11/24:  Therapeutic Exercise: -Theraputty review: with good return demo.  Reinforced performing exercises to tolerance and reduce duration or amount of putty to reduce discomfort. -Reviewed median nerve glides with min vc for form/technique.  -Added ulnar nerve glides d/t pt report of pain/paresthesias in ulnar nerve distribution of RUE; mod vc for form/technique.  Issued visual handout and reviewed. -Continue to reinforce passive wrist/digit flex/ext stretching in RUE  Manual Therapy: -  Deep tissue massage with Stickon bar to the wrist/digit extensions at the dorsal forearm, volar forearm to target tight flexors, and volar palm at the thenar and hypothenar eminence to reduce tendon stiffness within the hand.   -STM at the R lateral elbow d/t palpated  tenderness at the lateral epicondyle, working to increase circulation/decrease pain                                                                                        PATIENT EDUCATION: Education details: HEP  Person educated: Patient Education method: Programmer, Multimedia, Facilities Manager, Verbal cues, and Handouts Education comprehension: verbalized understanding, returned demonstration, verbal cues required, and needs further education  HOME EXERCISE PROGRAM: Grip/pinch strengthening with current foam at home; will add to HEP next session with issue of theraputty Yellow theraputty Median nerve glides  GOALS: Goals reviewed with patient? Yes  SHORT TERM GOALS: Target date: 01/08/25  Pt will be indep to perform HEP for improving R wrist/hand flexibility, strength, and coordination for daily tasks. Baseline: Eval: Will issue theraputty next session Goal status: INITIAL  2.  Pt will be indep to verbalize 2-3 cumulative trauma prevention strategies to reduce pain/paresthesias in the RUE.   Baseline: Eval: Education not yet initiated Goal status: INITIAL  LONG TERM GOALS: Target date: 02/19/25  Pt will increase MAM-20 score for musculoskeletal conditions by 10 or more points to indicate improvement in self perceived functional use of the  R hand for daily tasks.   Baseline: Eval: 46/80 Goal status: INITIAL  2.  Pt will increase R grip strength by 15 lbs or more in order to improve ability to hold and carry ADL supplies with reduced dropping.    Baseline: Eval: R grip 10 lbs (L 54 lbs) Goal status: INITIAL  3.  Pt will increase R lateral pinch strength by 5 or more lbs to improve grasp of toothbrush and ease ability to squeeze toothpaste from tube.  Baseline: Eval: R 2 lbs (L 14 lbs) Goal status: INITIAL  4.  Pt will increase R 3 point pinch strength by 5 or more lbs to improve manipulation of clothing fasteners. Baseline: Eval: R 1 lb (L 14 lbs); very hard to fasten medium sized buttons  and zip a jacket Goal status: INITIAL  5.  Pt will tolerate manual therapy, therapeutic modalities, and exercises to decrease pain in R wrist/hand to a reported 2/10 pain or less with activity.  Baseline: Eval: R hand pain can reach up to 8/10 with activity (4/10 pain at rest during eval) Goal status: INITIAL  ASSESSMENT:  CLINICAL IMPRESSION: Pt verbalized some pain/discomfort in the R hand/arm with initial HEP.  OT provided strategies for grading exercises down as needed.  Added ulnar nerve glides today d/t reported pain/paresthesias also in the ulnar nerve distribution of the RUE, and manual therapy performed as noted above d/t palpated tenderness at the R lateral epicondyle and dorsal forearm.  Pt generally given min vc to follow written handouts today with new exercises.  Pt reports she has a busy week next week and is unable to make an OT appointment until the following week.  Pt will continue to benefit  from skilled OT to address above noted deficits, working towards goals listed above to maximize indep with daily tasks and improve QOL.     PERFORMANCE DEFICITS: in functional skills including ADLs, IADLs, coordination, dexterity, sensation, ROM, strength, pain, fascial restrictions, muscle spasms, flexibility, Fine motor control, hearing, mobility, balance, body mechanics, decreased knowledge of precautions, decreased knowledge of use of DME, and UE functional use, and psychosocial skills including coping strategies, environmental adaptation, habits, and routines and behaviors.   IMPAIRMENTS: are limiting patient from ADLs, IADLs, rest and sleep, and leisure.   COMORBIDITIES: has co-morbidities such as fibromyalgia, chronic migraines, depression, dissocial personality disorder, obesity, HTN, R sided Bell's Palsy, paresthesia that affects occupational performance. Patient will benefit from skilled OT to address above impairments and improve overall function.  MODIFICATION OR ASSISTANCE TO  COMPLETE EVALUATION: Min-Moderate modification of tasks or assist with assess necessary to complete an evaluation.  OT OCCUPATIONAL PROFILE AND HISTORY: Detailed assessment: Review of records and additional review of physical, cognitive, psychosocial history related to current functional performance.  CLINICAL DECISION MAKING: Moderate - several treatment options, min-mod task modification necessary  REHAB POTENTIAL: Good for established goals, dependent upon regular attendance of therapy sessions (1-2x per week recommended; pt thinks she may be able to attend 1x a week to every other week, being uncertain d/t her pain on a given day with her fibromyalgia)  EVALUATION COMPLEXITY: Moderate      PLAN:  OT FREQUENCY: 1-2x/week  OT DURATION: 12 weeks  PLANNED INTERVENTIONS: 97168 OT Re-evaluation, 97535 self care/ADL training, 02889 therapeutic exercise, 97530 therapeutic activity, 97112 neuromuscular re-education, 97140 manual therapy, 97035 ultrasound, 97010 moist heat, 97010 cryotherapy, 97034 contrast bath, 97033 iontophoresis, 97750 Physical Performance Testing, 02239 Orthotic Initial, 97763 Orthotic/Prosthetic subsequent, passive range of motion, psychosocial skills training, coping strategies training, patient/family education, and DME and/or AE instructions  RECOMMENDED OTHER SERVICES: PT recommended for frequent falls; pt declines PT at this time  CONSULTED AND AGREED WITH PLAN OF CARE: Patient  PLAN FOR NEXT SESSION: see above  Inocente Blazing, MS, OTR/L  Inocente MARLA Blazing, OT 12/14/2024, 6:36 PM   "

## 2024-12-15 NOTE — Progress Notes (Unsigned)
 "     Yesenia Console, PA-C [address] [place], [place]  [address] Phone: [phone]   Primary Care Physician: Sowles, Krichna, MD  Primary Gastroenterologist:  Yesenia Console, PA-C / Norleen Kiang, MD   Chief Complaint: Follow-up IBS, constipation, GERD, MASLD       HPI:   Discussed the use of AI scribe software for clinical note transcription with the patient, who gave verbal consent to proceed.  Patient is hearing impaired.  We are using sign language interpreter from language services.  I last saw the patient 10/2023 at [facility] for follow-up of IBS, constipation, abdominal pain, and GERD.  She is currently taking Linzess  290 once daily with good control of constipation.  Takes pantoprazole  40 mg once daily with good control of acid reflux.  Needs medication refills.  02/2024 labs: Normal hemoglobin (15.5).  Normal iron panel, B12, and folate.  History of Present Illness Yesenia Wood is a 39 year old female with irritable bowel syndrome with constipation, gastroesophageal reflux disease, and MASLD who presents for follow-up of worsening lower abdominal pain and constipation.  Constipation and Bowel Habit Changes: - Chronic constipation since childhood with gradual worsening over time - Since November to early December 2025, bowel movements have decreased to once or twice weekly, sometimes with intervals exceeding one week - Frequent unsuccessful attempts to defecate and persistent sensation of incomplete evacuation - Cycles of constipation followed by episodes of diarrhea, with diarrhea typically occurring after a bowel movement and resolving within a few days - Constipation described as awful and closely linked to abdominal pain - No blood in stool except for a small amount two to three years ago, attributed to straining - No unusual weight loss  Abdominal Pain and Bloating: - Persistent, worsening abdominal pain primarily localized to the left lower quadrant, and  radiates across the low abdomen into the right lower quadrant. - Associated bloating - Gradual worsening of pain, especially on the left side  Therapeutic Response and Dietary Interventions: - Linzess  290 mcg daily previously provided partial relief for constipation, but has not been taken since early December 2025 due to pharmacy issues - No other prescription medications for constipation have been used - FODMAP diet followed for over a year without improvement - Multiple other dietary interventions have not been beneficial  Gastroesophageal Reflux Symptoms: - Pantoprazole  taken daily in the morning for acid reflux - Acid reflux symptoms are rare and generally well controlled, with occasional symptoms depending on diet - Continues to take pantoprazole  40 mg daily regularly   GI HISTORY: -Lab 07/04/2023 showed normal CBC, CMP, and lipase.  ALT was slightly elevated at 35.  Hx chronically elevated ALT due to fatty liver.  -Complete abdominal ultrasound 07/04/2023 showed hepatic steatosis, otherwise normal.  No gallstones. -Abdominal x-ray 06/06/2023 showed prominent gas in the colon, otherwise normal.  Small amount of stool throughout the colon.  No bowel obstruction.   -She had pelvic ultrasound through her GYN. -Saw GI 10 years ago in [place]  and had a normal colonoscopy.  Current Outpatient Medications  Medication Sig Dispense Refill   BOTOX 100 units SOLR injection Inject 100 Units into the muscle every 3 (three) months.     carvedilol  (COREG ) 6.25 MG tablet TAKE 1 TABLET BY MOUTH TWICE A DAY 180 tablet 3   Cholecalciferol (VITAMIN D ) 50 MCG (2000 UT) CAPS Take 1 capsule by mouth daily.     Cyanocobalamin  (VITAMIN B-12) 500 MCG SUBL Place 1 tablet (500 mcg total)  under the tongue once a week. 30 tablet 0   cyclobenzaprine  (FLEXERIL ) 5 MG tablet TAKE 1 TABLET BY MOUTH THREE TIMES A DAY AS NEEDED FOR MUSCLE SPASMS 90 tablet 0   EMGALITY 120 MG/ML SOAJ      etonogestrel  (NEXPLANON ) 68 MG  IMPL implant 68 mg by Subdermal route once. Implantation 01/31/24     fluticasone  (FLONASE) 50 MCG/ACT nasal spray Place 1 spray into both nostrils daily.     folic acid  (FOLVITE ) 400 MCG tablet Take 800 mcg by mouth daily.     levocetirizine (XYZAL) 5 MG tablet Take 5 mg by mouth every evening.     lubiprostone  (AMITIZA ) 24 MCG capsule Take 1 capsule (24 mcg total) by mouth 2 (two) times daily with a meal. 60 capsule 5   Multiple Vitamin (MULTIVITAMIN) tablet Take 1 tablet by mouth daily.     naproxen  (NAPROSYN ) 500 MG tablet TAKE 1 TABLET (500 MG TOTAL) BY MOUTH DAILY AS NEEDED (TAKE WITH BENADRYLL). 90 tablet 3   rosuvastatin  (CRESTOR ) 20 MG tablet TAKE 1 TABLET BY MOUTH EVERY DAY 90 tablet 0   pantoprazole  (PROTONIX ) 40 MG tablet Take 1 tablet (40 mg total) by mouth daily. No refills - needs f/up with PCP or GI 90 tablet 3   Current Facility-Administered Medications  Medication Dose Route Frequency Provider Last Rate Last Admin   lidocaine  (XYLOCAINE ) 1 % (with pres) injection 5 mL  5 mL Other Once        lidocaine  (XYLOCAINE ) 1 % (with pres) injection 6 mL  6 mL Other Once        triamcinolone  acetonide (KENALOG -40) injection 2 mg  2 mg Intramuscular Once        triamcinolone  acetonide (KENALOG -40) injection 5.2 mg  5.2 mg Intramuscular Once         Allergies as of 12/18/2024 - Review Complete 12/18/2024  Allergen Reaction Noted   Budesonide-formoterol fumarate Anaphylaxis and Other (See Comments) 08/19/2013   Influenza virus vaccine Hives and Other (See Comments) 08/19/2022   Astelin  [azelastine ]  10/12/2017   Gabapentin Nausea And Vomiting 11/12/2014   Nortriptyline Other (See Comments) 06/23/2015   Lamictal  [lamotrigine ]  09/15/2020   Milnacipran Other (See Comments) 04/24/2014   Montelukast Other (See Comments) 03/14/2024   Tegaderm ag mesh [silver ] Itching 09/25/2019   Diclofenac  Other (See Comments) 08/19/2013   Duloxetine hcl Other (See Comments) 08/19/2013   Montelukast  sodium Other (See Comments) 08/19/2013   Mupirocin Other (See Comments) 10/06/2014   Other Rash 02/06/2007   Oxycodone  hcl Rash 04/04/2018   Sulfa antibiotics Hives, Rash, and Itching 02/06/2007   Tegaderm alginate ag rope Rash 03/14/2024    Past Medical History:  Diagnosis Date   ADHD (attention deficit hyperactivity disorder)    B12 deficiency    Bell's palsy    right sided   Chest pain    a. 05/2015 ETT: elev HR/BP, but no acute ST/T changes; b. 09/2019 Cor CTA: Nl cors, Ca2+ = 0.   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   Fatigue    Fatty liver    Fibromyalgia    GERD (gastroesophageal reflux disease)    Headache    High triglycerides    Hyperglycemia    Hypertension    Hyperthyroidism    Moderate asthma    Obesity    Paresthesia    Premature birth  Past Surgical History:  Procedure Laterality Date   BARTHOLIN GLAND CYST REMOVAL     COCHLEAR IMPLANT     COCHLEAR IMPLANT REMOVAL     DILATATION AND CURETTAGE/HYSTEROSCOPY WITH MINERVA N/A 03/05/2021   Procedure: DILATATION AND CURETTAGE/HYSTEROSCOPY WITH MINERVA;  Surgeon: Connell Davies, MD;  Location: [facility];  Service: Gynecology;  Laterality: N/A;   LAPAROSCOPIC TUBAL LIGATION Bilateral 03/26/2018   Procedure: LAPAROSCOPIC TUBAL LIGATION;  Surgeon: Connell Davies, MD;  Location: [facility];  Service: Gynecology;  Laterality: Bilateral;  with peritoneal biopsies   LAPAROSCOPY     MYRINGOTOMY WITH TUBE PLACEMENT     TUBAL LIGATION  03/26/2018    Review of Systems:    All systems reviewed and negative except where noted in HPI.    Physical Exam:  BP 124/80   Pulse 98   Ht 5' 2 (1.575 m)   Wt 195 lb (88.5 kg)   BMI 35.67 kg/m  No LMP recorded. Patient has had an implant.  General: Well-nourished, well-developed in no acute distress.  Lungs: Clear to auscultation bilaterally. Non-labored. Heart: Regular rate and  rhythm, no murmurs rubs or gallops.  Abdomen: Bowel sounds are normal; Abdomen is Soft; No hepatosplenomegaly, masses or hernias; moderate left lower quadrant tenderness with guarding.  Mild right lower quadrant tenderness; No upper abdominal tenderness. Neuro: Alert and oriented x 3.  Grossly intact.  Psych: Alert and cooperative, normal mood and affect.   Imaging Studies: No results found.  Labs: CBC    Component Value Date/Time   WBC 10.4 01/08/2024 1455   WBC 7.2 07/04/2023 1217   RBC 5.40 (H) 01/08/2024 1455   RBC 4.82 07/04/2023 1217   HGB 15.5 01/08/2024 1455   HCT 48.8 (H) 01/08/2024 1455   PLT 414 01/08/2024 1455   MCV 90 01/08/2024 1455   MCV 86 08/13/2014 2040   MCH 28.7 01/08/2024 1455   MCH 28.2 07/04/2023 1217   MCHC 31.8 01/08/2024 1455   MCHC 32.5 07/04/2023 1217   RDW 13.2 01/08/2024 1455   RDW 12.5 08/13/2014 2040   LYMPHSABS 2,707 07/04/2023 1217   LYMPHSABS 4.7 (H) 06/23/2015 0932   MONOABS 0.6 07/17/2021 1301   EOSABS 29 07/04/2023 1217   EOSABS 0.1 06/23/2015 0932   BASOSABS 43 07/04/2023 1217   BASOSABS 0.0 06/23/2015 0932    CMP     Component Value Date/Time   NA 141 01/08/2024 1455   NA 137 08/13/2014 2040   K 4.8 01/08/2024 1455   K 3.8 08/13/2014 2040   CL 103 01/08/2024 1455   CL 104 08/13/2014 2040   CO2 22 01/08/2024 1455   CO2 23 08/13/2014 2040   GLUCOSE 85 01/08/2024 1455   GLUCOSE 83 07/04/2023 1217   GLUCOSE 74 08/13/2014 2040   BUN 8 01/08/2024 1455   BUN 10 08/13/2014 2040   CREATININE 1.08 (H) 01/08/2024 1455   CREATININE 0.80 07/04/2023 1217   CALCIUM  9.6 01/08/2024 1455   CALCIUM  9.3 08/13/2014 2040   PROT 6.9 09/26/2023 1121   ALBUMIN 4.5 09/26/2023 1121   AST 21 09/26/2023 1121   ALT 19 09/26/2023 1121   ALKPHOS 60 09/26/2023 1121   BILITOT 0.3 09/26/2023 1121   GFRNONAA >60 07/18/2021 1817   GFRNONAA 91 10/09/2020 1114   GFRAA 105 10/09/2020 1114     Assessment and Plan:   Yesenia Wood is a 39 y.o.  y/o female returns for follow-up of:  1.  Irritable bowel syndrome, constipation predominant - Stop Linzess  290 -  not effective. - Rx Amitiza  (lubiprostone ) 24 mcg 1 capsule twice daily, #60, 5 refills. - If Amitiza  is not effective, then try Ibsrela or Trulance. - Continue drinking 64 ounces of water daily. - High-fiber diet with fruits, vegetables, whole grains. - Okay to stop low FODMAP diet since it has not helped.  2.  Worsening Lower abdominal Pain, LLQ > RLQ with Moderate LLQ tenderness today. - Labs: CBC, CMP - CT Abdomen / Pelvis with Contrast: Rule out diverticulitis.  3.  GERD: Controlled on PP - Refill pantoprazole  40 mg daily, #90, 3 refills - GERD diet.  4.  Metabolic dysfunction-associated steatotic liver disease (MASLD) - Continue low-fat, low carbohydrate diet, weight loss, and exercise. - Lab: CBC, CMP;  Calculate Fib-4 with Results - If Fib-4 is elevated then I will schedule RUQ ultrasound with liver elastography  - Consider Wegovy or Rezdiffra if F2 or F3 Liver Fibrosis is Present  5.  Colon cancer screening - Plan for screening colonoscopy at age 41. - She has no current indication for diagnostic colonoscopy.    Yesenia Console, PA-C  Follow up office visit with TG in 2 months to follow-up lower abdominal pain, IBS, constipation.   "

## 2024-12-16 ENCOUNTER — Ambulatory Visit: Admitting: Family Medicine

## 2024-12-16 ENCOUNTER — Encounter: Payer: Self-pay | Admitting: Family Medicine

## 2024-12-16 VITALS — BP 118/78 | HR 94 | Resp 16 | Ht 62.0 in | Wt 194.3 lb

## 2024-12-16 DIAGNOSIS — Z6835 Body mass index (BMI) 35.0-35.9, adult: Secondary | ICD-10-CM

## 2024-12-16 DIAGNOSIS — G8929 Other chronic pain: Secondary | ICD-10-CM

## 2024-12-16 DIAGNOSIS — M797 Fibromyalgia: Secondary | ICD-10-CM

## 2024-12-16 DIAGNOSIS — E66812 Obesity, class 2: Secondary | ICD-10-CM

## 2024-12-16 DIAGNOSIS — F609 Personality disorder, unspecified: Secondary | ICD-10-CM | POA: Diagnosis not present

## 2024-12-16 DIAGNOSIS — M545 Low back pain, unspecified: Secondary | ICD-10-CM

## 2024-12-16 DIAGNOSIS — F330 Major depressive disorder, recurrent, mild: Secondary | ICD-10-CM | POA: Diagnosis not present

## 2024-12-16 NOTE — Progress Notes (Signed)
 Name: Yesenia Wood   MRN: [id number]    DOB: 03-17-1986   Date:12/16/2024       Progress Note  Subjective  Chief Complaint  Chief Complaint  Patient presents with   Medical Management of Chronic Issues   Discussed the use of AI scribe software for clinical note transcription with the patient, who gave verbal consent to proceed.  History of Present Illness Yesenia Wood is a 39 year old female with major depressive disorder who presents for a follow-up on her depression management.  She has a history of major depressive disorder, recurrent, moderate, with a previous PHQ-9 score of 13, which has now improved to 3. She was previously started on Auvility but did not tolerate it well as it worsened her depression and increased her sleep, affecting her ability to work. She has tried multiple medications in the past, including Lamictal , duloxetine (Cymbalta), and nortriptyline, with limited success. She has seen a psychiatrist in the past and underwent therapy, but it has been a long time since her last session.  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 The symptoms have improved significantly over the years and have not been a concern for about ten years.  She is also dealing with weight management issues. Her weight has increased from 186 pounds in October to 194 pounds currently, with a BMI over 35. She aims to reduce her weight to 150 pounds. She finds it challenging to lose weight due to dietary restrictions related to migraines and IBS, which limit her intake of certain vegetables and fruits.  She experiences chronic low back pain, described as a constant bruised feeling that becomes sharp with movement. The pain is localized to the lower back and does not radiate down her legs. She uses a stationary bike for exercise but finds walking difficult due to the pain.   Patient Active Problem List   Diagnosis Date  Noted   Right hand weakness 11/06/2024   Facet arthritis of lumbar region 10/09/2024   Chronic allergic conjunctivitis 09/12/2024   Myofascial pain 10/04/2023   Chronic pain syndrome 03/03/2023   Dyslipidemia 02/10/2022   Vitamin D  deficiency 02/10/2022   Positive ANA (antinuclear antibody) 12/20/2021   Fibromyalgia 12/20/2021   Right sided numbness 12/26/2019   RLS (restless legs syndrome) 09/30/2019   MDD (major depressive disorder), recurrent episode, moderate (HCC) 07/17/2019   Social anxiety disorder 07/17/2019   Muteness 09/10/2018   OSA (obstructive sleep apnea) 10/10/2016   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   Dissocial personality disorder (HCC) 06/07/2016   Sleep disturbance 05/10/2016   Chronic migraine without aura 03/29/2016   Palpitations 06/09/2015   Deaf 05/16/2015   B12 deficiency 05/16/2015   Cochlear implant status 05/16/2015   Fatty infiltration of liver 05/16/2015   Gastro-esophageal reflux disease without esophagitis 05/16/2015   Generalized headache 05/16/2015   Right-sided Bell's palsy 05/16/2015   Irregular menstrual cycle 05/16/2015   Paresthesia 05/16/2015   Disorder of labyrinth 05/16/2015   Migraine 08/05/2013   Allergic rhinitis 07/01/2013   Obesity (BMI 30-39.9) 07/01/2013   Bartholin gland cyst 09/16/2008    Past Surgical History:  Procedure Laterality Date   BARTHOLIN GLAND CYST REMOVAL     COCHLEAR IMPLANT     COCHLEAR IMPLANT REMOVAL     DILATATION AND CURETTAGE/HYSTEROSCOPY WITH MINERVA N/A 03/05/2021   Procedure: DILATATION AND CURETTAGE/HYSTEROSCOPY WITH MINERVA;  Surgeon: Connell Davies, MD;  Location: [facility];  Service: Gynecology;  Laterality: N/A;   LAPAROSCOPIC TUBAL LIGATION Bilateral 03/26/2018   Procedure: LAPAROSCOPIC TUBAL LIGATION;  Surgeon: Connell Davies, MD;  Location: [facility];  Service: Gynecology;  Laterality: Bilateral;  with peritoneal biopsies   LAPAROSCOPY     MYRINGOTOMY WITH TUBE PLACEMENT     TUBAL  LIGATION  03/26/2018    Family History  Adopted: Yes  Family history unknown: Yes    Social History   Tobacco Use   Smoking status: Former    Current packs/day: 0.00    Average packs/day: 1 pack/day for 2.0 years (2.0 ttl pk-yrs)    Types: Cigarettes    Start date: 11/28/2004    Quit date: 11/28/2006    Years since quitting: 18.0   Smokeless tobacco: Never  Substance Use Topics   Alcohol use: Yes    Comment: 1 every 3 months    Current Medications[1]  Allergies[2]  I personally reviewed active problem list, family history ,  medication list with the patient/caregiver today.   ROS  Ten systems reviewed and is negative except as mentioned in HPI    Objective Physical Exam MEASUREMENTS: Weight- 194, BMI- 35.0. CONSTITUTIONAL: Patient appears well-developed and well-nourished. No distress. HEENT: Head atraumatic, normocephalic, neck supple. CARDIOVASCULAR: Normal rate, regular rhythm and normal heart sounds. No murmur heard. No BLE edema. PULMONARY: Effort normal and breath sounds normal. No respiratory distress. ABDOMINAL: There is no tenderness or distention. MUSCULOSKELETAL: Normal gait. Without gross motor or sensory deficit. Limited range of motion in spine, pain on flexion and lateral flexion to left. PSYCHIATRIC: Patient has a normal mood and affect. Behavior is normal. Judgment and thought content normal.  Vitals:   12/16/24 1103  BP: 118/78  Pulse: 94  Resp: 16  SpO2: 99%  Weight: 194 lb 4.8 oz (88.1 kg)  Height: 5' 2 (1.575 m)    Body mass index is 35.54 kg/m.    PHQ2/9:    12/16/2024   11:02 AM 12/09/2024   10:48 AM 11/06/2024   11:00 AM 10/09/2024    1:18 PM 09/12/2024   11:18 AM  Depression screen PHQ 2/9  Decreased Interest 1 1 1 1 1   Down, Depressed, Hopeless 1 1 1 1 1   PHQ - 2 Score 2 2 2 2 2   Altered sleeping 0    3  Tired, decreased energy 0    3  Change in appetite 1    1  Feeling bad or failure about yourself  0    1  Trouble  concentrating 0    2  Moving slowly or fidgety/restless 0    1  Suicidal thoughts 0    0  PHQ-9 Score 3    13   Difficult doing work/chores Somewhat difficult    Very difficult     Data saved with a previous flowsheet row definition    phq 9 is positive  Fall Risk:    12/16/2024   10:58 AM 12/09/2024   10:48 AM 11/06/2024   10:58 AM 10/09/2024    1:17 PM 09/12/2024   10:32 AM  Fall Risk   Falls in the past year? 1 1 1  0 1  Comment   Last fall in Dec 2025 with no injury.    Number falls in past yr: 1 1 1  0 0  Injury with Fall? 0 0 0 0  0   Risk for fall due to : Impaired balance/gait    Impaired balance/gait  Follow up Falls evaluation completed  Falls evaluation completed     Data saved with a previous flowsheet row definition    Assessment & Plan Major depressive disorder, recurrent/personality disorder ( to be discussed with psychiatrist )  Recurrent major depressive disorder improved from moderate to mild. Previous medications not tolerated. No recent therapy or psychiatric follow-up. - Referred to psychiatrist Dr. Eappen for evaluation and management. - Referred to therapist for ongoing therapy. - Provided information on [facility] for deaf therapy options.  Morbid obesity BMI over 35 with recent weight gain. Goal weight is 150 lbs. Dietary restrictions due to migraines and IBS complicate weight loss. - Encouraged dietary modifications focusing on protein intake first, followed by fiber and starches. - Supported weight loss efforts and encouraged continued motivation.  Fibromyalgia Chronic fibromyalgia with muscle aches and back pain. Managed with physical activity, ice, Tylenol , and Flexeril . - Continue current management with physical activity, ice, Tylenol , and Flexeril  as needed.  Chronic right-sided low back pain Chronic right-sided low back pain, constant and sharp, worsened by movement. Scheduled for L4, L5 facet block and ablation. - Continue with scheduled L4, L5  facet block and ablation in February. - Encouraged use of stationary bike for exercise. - Continue current pain management strategies including ice and medication as needed.        [1]  Current Outpatient Medications:    BOTOX 100 units SOLR injection, Inject 100 Units into the muscle every 3 (three) months., Disp: , Rfl:    carvedilol  (COREG ) 6.25 MG tablet, TAKE 1 TABLET BY MOUTH TWICE A DAY, Disp: 180 tablet, Rfl: 3   Cholecalciferol (VITAMIN D ) 50 MCG (2000 UT) CAPS, Take 1 capsule by mouth daily., Disp: , Rfl:    Cyanocobalamin  (VITAMIN B-12) 500 MCG SUBL, Place 1 tablet (500 mcg total) under the tongue once a week., Disp: 30 tablet, Rfl: 0   cyclobenzaprine  (FLEXERIL ) 5 MG tablet, TAKE 1 TABLET BY MOUTH THREE TIMES A DAY AS NEEDED FOR MUSCLE SPASMS, Disp: 90 tablet, Rfl: 0   EMGALITY 120 MG/ML SOAJ, , Disp: , Rfl:    etonogestrel  (NEXPLANON ) 68 MG IMPL implant, 68 mg by Subdermal route once. Implantation 01/31/24, Disp: , Rfl:    fluticasone  (FLONASE) 50 MCG/ACT nasal spray, Place 1 spray into both nostrils daily., Disp: , Rfl:    folic acid  (FOLVITE ) 400 MCG tablet, Take 800 mcg by mouth daily., Disp: , Rfl:    levocetirizine (XYZAL) 5 MG tablet, Take 5 mg by mouth every evening., Disp: , Rfl:    linaclotide  (LINZESS ) 290 MCG CAPS capsule, Take 1 capsule (290 mcg total) by mouth daily before breakfast., Disp: 30 capsule, Rfl: 5   naproxen  (NAPROSYN ) 500 MG tablet, TAKE 1 TABLET (500 MG TOTAL) BY MOUTH DAILY AS NEEDED (TAKE WITH BENADRYLL)., Disp: 90 tablet, Rfl: 3   pantoprazole  (PROTONIX ) 40 MG tablet, Take 1 tablet (40 mg total) by mouth daily. No refills - needs f/up with PCP or GI, Disp: 90 tablet, Rfl: 3   rosuvastatin  (CRESTOR ) 20 MG tablet, TAKE 1 TABLET BY MOUTH EVERY DAY, Disp: 90 tablet, Rfl: 0  Current Facility-Administered Medications:    lidocaine  (XYLOCAINE ) 1 % (with pres) injection 5 mL, 5 mL, Other, Once,    lidocaine  (XYLOCAINE ) 1 % (with pres) injection 6 mL, 6 mL,  Other, Once,    triamcinolone  acetonide (KENALOG -40) injection 2 mg, 2 mg, Intramuscular, Once,    triamcinolone  acetonide (KENALOG -40) injection 5.2 mg, 5.2 mg, Intramuscular, Once,  [2]  Allergies Allergen Reactions   Budesonide-Formoterol Fumarate Anaphylaxis  and Other (See Comments)    Other reaction(s): Other (See Comments) chest pain Chest pain chest pain   Influenza Virus Vaccine Hives and Other (See Comments)    rash   Astelin  [Azelastine ]     Tingling, numbness, nausea   Gabapentin Nausea And Vomiting   Nortriptyline Other (See Comments)    Other reaction(s): Other (See Comments)   Lamictal  [Lamotrigine ]     Self injurious thoughts   Milnacipran Other (See Comments)    Pressure in eyes   Montelukast Other (See Comments)   Tegaderm Ag Mesh [Silver ] Itching   Diclofenac  Other (See Comments)    Other reaction(s): Other (See Comments)   Duloxetine Hcl Other (See Comments)    Other reaction(s): Other (See Comments) Other reaction(s): unknown   Montelukast Sodium Other (See Comments)    Other reaction(s): wheezing/sob   Mupirocin Other (See Comments)   Other Rash   Oxycodone  Hcl Rash   Sulfa Antibiotics Hives, Rash and Itching   Tegaderm Alginate Ag Rope Rash

## 2024-12-17 ENCOUNTER — Ambulatory Visit

## 2024-12-18 ENCOUNTER — Encounter: Payer: Self-pay | Admitting: Physician Assistant

## 2024-12-18 ENCOUNTER — Ambulatory Visit: Admitting: Physician Assistant

## 2024-12-18 ENCOUNTER — Other Ambulatory Visit

## 2024-12-18 ENCOUNTER — Ambulatory Visit: Payer: Self-pay | Admitting: Physician Assistant

## 2024-12-18 VITALS — BP 124/80 | HR 98 | Ht 62.0 in | Wt 195.0 lb

## 2024-12-18 DIAGNOSIS — K581 Irritable bowel syndrome with constipation: Secondary | ICD-10-CM

## 2024-12-18 DIAGNOSIS — K5900 Constipation, unspecified: Secondary | ICD-10-CM

## 2024-12-18 DIAGNOSIS — R1032 Left lower quadrant pain: Secondary | ICD-10-CM | POA: Diagnosis not present

## 2024-12-18 DIAGNOSIS — K219 Gastro-esophageal reflux disease without esophagitis: Secondary | ICD-10-CM | POA: Diagnosis not present

## 2024-12-18 DIAGNOSIS — K760 Fatty (change of) liver, not elsewhere classified: Secondary | ICD-10-CM | POA: Diagnosis not present

## 2024-12-18 LAB — CBC WITH DIFFERENTIAL/PLATELET
Basophils Absolute: 0.1 K/uL (ref 0.0–0.1)
Basophils Relative: 0.8 % (ref 0.0–3.0)
Eosinophils Absolute: 0 K/uL (ref 0.0–0.7)
Eosinophils Relative: 0.2 % (ref 0.0–5.0)
HCT: 42.8 % (ref 36.0–46.0)
Hemoglobin: 14.4 g/dL (ref 12.0–15.0)
Lymphocytes Relative: 32.3 % (ref 12.0–46.0)
Lymphs Abs: 3.3 K/uL (ref 0.7–4.0)
MCHC: 33.8 g/dL (ref 30.0–36.0)
MCV: 87.7 fl (ref 78.0–100.0)
Monocytes Absolute: 1 K/uL (ref 0.1–1.0)
Monocytes Relative: 9.3 % (ref 3.0–12.0)
Neutro Abs: 5.9 K/uL (ref 1.4–7.7)
Neutrophils Relative %: 57.4 % (ref 43.0–77.0)
Platelets: 348 K/uL (ref 150.0–400.0)
RBC: 4.88 Mil/uL (ref 3.87–5.11)
RDW: 13.5 % (ref 11.5–15.5)
WBC: 10.4 K/uL (ref 4.0–10.5)

## 2024-12-18 LAB — COMPREHENSIVE METABOLIC PANEL WITH GFR
ALT: 28 U/L (ref 3–35)
AST: 18 U/L (ref 5–37)
Albumin: 4.5 g/dL (ref 3.5–5.2)
Alkaline Phosphatase: 65 U/L (ref 39–117)
BUN: 12 mg/dL (ref 6–23)
CO2: 32 meq/L (ref 19–32)
Calcium: 9.6 mg/dL (ref 8.4–10.5)
Chloride: 102 meq/L (ref 96–112)
Creatinine, Ser: 0.75 mg/dL (ref 0.40–1.20)
GFR: 100.56 mL/min
Glucose, Bld: 93 mg/dL (ref 70–99)
Potassium: 4.1 meq/L (ref 3.5–5.1)
Sodium: 139 meq/L (ref 135–145)
Total Bilirubin: 0.4 mg/dL (ref 0.2–1.2)
Total Protein: 7.2 g/dL (ref 6.0–8.3)

## 2024-12-18 MED ORDER — PANTOPRAZOLE SODIUM 40 MG PO TBEC: 40.0000 mg | DELAYED_RELEASE_TABLET | Freq: Every day | ORAL | 3 refills | Status: AC

## 2024-12-18 MED ORDER — LUBIPROSTONE 24 MCG PO CAPS: 24.0000 ug | ORAL_CAPSULE | Freq: Two times a day (BID) | ORAL | 5 refills | Status: AC

## 2024-12-18 NOTE — Patient Instructions (Addendum)
 Stop Linzess  290 mcg, start Amitaza 24 mcg.  We have sent the following medications to your pharmacy for you to pick up at your convenience: Pantoprazole  40 mg once daily.  Amitza  24 mcg twice a day.   Your provider has requested that you go to the basement level for lab work before leaving today. Press B on the elevator. The lab is located at the first door on the left as you exit the elevator.  Due to recent changes in healthcare laws, you may see the results of your imaging and laboratory studies on MyChart before your provider has had a chance to review them.  We understand that in some cases there may be results that are confusing or concerning to you. Not all laboratory results come back in the same time frame and the provider may be waiting for multiple results in order to interpret others.  Please give us  48 hours in order for your provider to thoroughly review all the results before contacting the office for clarification of your results.    You have been scheduled for a CT scan of the abdomen and pelvis at [facility], 1st floor Radiology. You are scheduled on Friday, 12/20/24 at 2:30 pm. You should arrive 2 hours and 15 minutes prior to your appointment time for registration and to drink the oral contrast.  Please follow the written instructions below on the day of your exam:   1) Do not eat anything after 10:30 am (4 hours prior to your test)    You may take any medications as prescribed with a small amount of water, if necessary. If you take any of the following medications: METFORMIN, GLUCOPHAGE, GLUCOVANCE, AVANDAMET, RIOMET, FORTAMET, ACTOPLUS MET, JANUMET, GLUMETZA or METAGLIP, you MAY be asked to HOLD this medication 48 hours AFTER the exam.   The purpose of you drinking the oral contrast is to aid in the visualization of your intestinal tract. The contrast solution may cause some diarrhea. Depending on your individual set of symptoms, you may also receive an  intravenous injection of x-ray contrast/dye. Plan on being at [facility] for 45 minutes or longer, depending on the type of exam you are having performed.   If you have any questions regarding your exam or if you need to reschedule, you may call [facility] at [phone] between the hours of 8:00 am and 5:00 pm, Monday-Friday.    Thank you for trusting me with your gastrointestinal care!   [name], PA-C  _______________________________________________________  If your blood pressure at your visit was 140/90 or greater, please contact your primary care physician to follow up on this.  _______________________________________________________  If you are age 40 or older, your body mass index should be between 23-30. Your Body mass index is 35.67 kg/m. If this is out of the aforementioned range listed, please consider follow up with your Primary Care Provider.  If you are age 58 or younger, your body mass index should be between 19-25. Your Body mass index is 35.67 kg/m. If this is out of the aformentioned range listed, please consider follow up with your Primary Care Provider.   ________________________________________________________  The [facility] providers would like to encourage you to use MYCHART to communicate with providers for non-urgent requests or questions.  Due to long hold times on the telephone, sending your provider a message by [facility] may be a faster and more efficient way to get a response.  Please allow 48 business hours for a response.  Please remember that this is for non-urgent requests.  _______________________________________________________  [facility] is using a team-based approach to care.  Your team is made up of your doctor and two to three APPS. Our APPS (Nurse Practitioners and Physician Assistants) work with your physician to ensure care continuity for you. They are fully qualified to address your health concerns and develop a  treatment plan. They communicate directly with your gastroenterologist to care for you. Seeing the Advanced Practice Practitioners on your physician's team can help you by facilitating care more promptly, often allowing for earlier appointments, access to diagnostic testing, procedures, and other specialty referrals.

## 2024-12-18 NOTE — Progress Notes (Signed)
 Noted.

## 2024-12-19 ENCOUNTER — Ambulatory Visit: Admitting: Occupational Therapy

## 2024-12-20 ENCOUNTER — Ambulatory Visit
Admission: RE | Admit: 2024-12-20 | Discharge: 2024-12-20 | Disposition: A | Source: Ambulatory Visit | Attending: Physician Assistant | Admitting: Physician Assistant

## 2024-12-20 DIAGNOSIS — K5900 Constipation, unspecified: Secondary | ICD-10-CM | POA: Insufficient documentation

## 2024-12-20 DIAGNOSIS — K219 Gastro-esophageal reflux disease without esophagitis: Secondary | ICD-10-CM | POA: Insufficient documentation

## 2024-12-20 DIAGNOSIS — K760 Fatty (change of) liver, not elsewhere classified: Secondary | ICD-10-CM | POA: Insufficient documentation

## 2024-12-20 DIAGNOSIS — R1032 Left lower quadrant pain: Secondary | ICD-10-CM | POA: Diagnosis present

## 2024-12-20 DIAGNOSIS — K581 Irritable bowel syndrome with constipation: Secondary | ICD-10-CM | POA: Insufficient documentation

## 2024-12-20 MED ORDER — IOHEXOL 300 MG/ML  SOLN
100.0000 mL | Freq: Once | INTRAMUSCULAR | Status: AC | PRN
  Administered 2024-12-20: 100 mL via INTRAVENOUS

## 2024-12-24 ENCOUNTER — Ambulatory Visit

## 2024-12-27 ENCOUNTER — Encounter: Admitting: Physical Medicine & Rehabilitation

## 2025-01-06 ENCOUNTER — Ambulatory Visit

## 2025-01-10 ENCOUNTER — Encounter: Admitting: Physical Medicine & Rehabilitation

## 2025-01-13 ENCOUNTER — Encounter: Admitting: Physical Medicine and Rehabilitation

## 2025-01-15 ENCOUNTER — Ambulatory Visit

## 2025-02-10 ENCOUNTER — Encounter: Admitting: Physical Medicine and Rehabilitation

## 2025-02-17 ENCOUNTER — Ambulatory Visit: Admitting: Physician Assistant

## 2025-03-17 ENCOUNTER — Ambulatory Visit: Admitting: Family Medicine

## 2025-08-28 ENCOUNTER — Ambulatory Visit
